# Patient Record
Sex: Male | Born: 1935
Health system: Southern US, Community
[De-identification: ages and names within clinical notes are randomized; demographics above are authoritative.]

## PROBLEM LIST (undated history)

## (undated) DIAGNOSIS — Z923 Personal history of irradiation: Secondary | ICD-10-CM

## (undated) DIAGNOSIS — Z85828 Personal history of other malignant neoplasm of skin: Secondary | ICD-10-CM

## (undated) DIAGNOSIS — N281 Cyst of kidney, acquired: Secondary | ICD-10-CM

## (undated) DIAGNOSIS — K08109 Complete loss of teeth, unspecified cause, unspecified class: Secondary | ICD-10-CM

## (undated) DIAGNOSIS — E039 Hypothyroidism, unspecified: Secondary | ICD-10-CM

## (undated) DIAGNOSIS — I722 Aneurysm of renal artery: Secondary | ICD-10-CM

## (undated) DIAGNOSIS — C44111 Basal cell carcinoma of skin of unspecified eyelid, including canthus: Secondary | ICD-10-CM

## (undated) DIAGNOSIS — M314 Aortic arch syndrome [Takayasu]: Secondary | ICD-10-CM

## (undated) DIAGNOSIS — N400 Enlarged prostate without lower urinary tract symptoms: Secondary | ICD-10-CM

## (undated) DIAGNOSIS — C4491 Basal cell carcinoma of skin, unspecified: Secondary | ICD-10-CM

## (undated) DIAGNOSIS — Z960 Presence of urogenital implants: Secondary | ICD-10-CM

## (undated) DIAGNOSIS — K219 Gastro-esophageal reflux disease without esophagitis: Secondary | ICD-10-CM

## (undated) DIAGNOSIS — IMO0002 Reserved for concepts with insufficient information to code with codable children: Secondary | ICD-10-CM

## (undated) DIAGNOSIS — C4921 Malignant neoplasm of connective and soft tissue of right lower limb, including hip: Secondary | ICD-10-CM

## (undated) DIAGNOSIS — Z859 Personal history of malignant neoplasm, unspecified: Secondary | ICD-10-CM

## (undated) DIAGNOSIS — I701 Atherosclerosis of renal artery: Secondary | ICD-10-CM

## (undated) DIAGNOSIS — N183 Chronic kidney disease, stage 3 unspecified: Secondary | ICD-10-CM

## (undated) DIAGNOSIS — G4733 Obstructive sleep apnea (adult) (pediatric): Secondary | ICD-10-CM

## (undated) DIAGNOSIS — Z9889 Other specified postprocedural states: Secondary | ICD-10-CM

## (undated) DIAGNOSIS — E785 Hyperlipidemia, unspecified: Secondary | ICD-10-CM

## (undated) DIAGNOSIS — I10 Essential (primary) hypertension: Secondary | ICD-10-CM

## (undated) DIAGNOSIS — I890 Lymphedema, not elsewhere classified: Secondary | ICD-10-CM

## (undated) DIAGNOSIS — Z789 Other specified health status: Secondary | ICD-10-CM

## (undated) DIAGNOSIS — M316 Other giant cell arteritis: Secondary | ICD-10-CM

## (undated) DIAGNOSIS — Z972 Presence of dental prosthetic device (complete) (partial): Secondary | ICD-10-CM

## (undated) DIAGNOSIS — C099 Malignant neoplasm of tonsil, unspecified: Secondary | ICD-10-CM

## (undated) DIAGNOSIS — Z9989 Dependence on other enabling machines and devices: Secondary | ICD-10-CM

## (undated) DIAGNOSIS — R5383 Other fatigue: Secondary | ICD-10-CM

## (undated) DIAGNOSIS — E538 Deficiency of other specified B group vitamins: Secondary | ICD-10-CM

## (undated) DIAGNOSIS — H3530 Unspecified macular degeneration: Secondary | ICD-10-CM

## (undated) DIAGNOSIS — G2581 Restless legs syndrome: Secondary | ICD-10-CM

## (undated) DIAGNOSIS — R911 Solitary pulmonary nodule: Secondary | ICD-10-CM

## (undated) DIAGNOSIS — H353130 Nonexudative age-related macular degeneration, bilateral, stage unspecified: Secondary | ICD-10-CM

## (undated) DIAGNOSIS — K117 Disturbances of salivary secretion: Secondary | ICD-10-CM

## (undated) DIAGNOSIS — N261 Atrophy of kidney (terminal): Secondary | ICD-10-CM

## (undated) DIAGNOSIS — R351 Nocturia: Secondary | ICD-10-CM

## (undated) DIAGNOSIS — Z8546 Personal history of malignant neoplasm of prostate: Secondary | ICD-10-CM

## (undated) DIAGNOSIS — T4145XA Adverse effect of unspecified anesthetic, initial encounter: Secondary | ICD-10-CM

## (undated) DIAGNOSIS — N320 Bladder-neck obstruction: Secondary | ICD-10-CM

## (undated) DIAGNOSIS — T8859XA Other complications of anesthesia, initial encounter: Secondary | ICD-10-CM

## (undated) DIAGNOSIS — R339 Retention of urine, unspecified: Secondary | ICD-10-CM

## (undated) DIAGNOSIS — E559 Vitamin D deficiency, unspecified: Secondary | ICD-10-CM

## (undated) HISTORY — PX: OTHER SURGICAL HISTORY: SHX169

## (undated) HISTORY — DX: Basal cell carcinoma of skin of unspecified eyelid, including canthus: C44.111

## (undated) HISTORY — DX: Restless legs syndrome: G25.81

## (undated) HISTORY — DX: Vitamin D deficiency, unspecified: E55.9

## (undated) HISTORY — PX: TONSILLECTOMY: SUR1361

## (undated) HISTORY — PX: CATARACT EXTRACTION W/ INTRAOCULAR LENS  IMPLANT, BILATERAL: SHX1307

## (undated) HISTORY — DX: Reserved for concepts with insufficient information to code with codable children: IMO0002

## (undated) HISTORY — DX: Essential (primary) hypertension: I10

## (undated) HISTORY — DX: Nocturia: R35.1

## (undated) HISTORY — DX: Deficiency of other specified B group vitamins: E53.8

## (undated) HISTORY — DX: Hyperlipidemia, unspecified: E78.5

## (undated) HISTORY — PX: MOHS SURGERY: SUR867

---
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
## 1998-01-17 HISTORY — PX: INGUINAL HERNIA REPAIR: SUR1180

## 2000-01-18 DIAGNOSIS — Z923 Personal history of irradiation: Secondary | ICD-10-CM

## 2000-01-18 HISTORY — DX: Personal history of irradiation: Z92.3

## 2001-01-17 HISTORY — PX: OTHER SURGICAL HISTORY: SHX169

## 2008-06-25 ENCOUNTER — Emergency Department (HOSPITAL_COMMUNITY): Admission: EM | Admit: 2008-06-25 | Discharge: 2008-06-25 | Payer: Self-pay | Admitting: Emergency Medicine

## 2010-04-26 LAB — URINE CULTURE: Colony Count: 55000

## 2010-04-26 LAB — URINALYSIS, ROUTINE W REFLEX MICROSCOPIC
Bilirubin Urine: NEGATIVE
Ketones, ur: NEGATIVE mg/dL
Nitrite: POSITIVE — AB
Protein, ur: 100 mg/dL — AB
Urobilinogen, UA: 0.2 mg/dL (ref 0.0–1.0)
pH: 6 (ref 5.0–8.0)

## 2010-08-02 DIAGNOSIS — G4700 Insomnia, unspecified: Secondary | ICD-10-CM | POA: Insufficient documentation

## 2010-08-02 DIAGNOSIS — M479 Spondylosis, unspecified: Secondary | ICD-10-CM | POA: Insufficient documentation

## 2010-09-28 ENCOUNTER — Ambulatory Visit (HOSPITAL_COMMUNITY)
Admission: RE | Admit: 2010-09-28 | Discharge: 2010-09-28 | Disposition: A | Discharge status: Home / Self Care | Payer: Medicare Other | Source: Ambulatory Visit | Attending: Chiropractic Medicine | Admitting: Chiropractic Medicine

## 2010-09-28 ENCOUNTER — Other Ambulatory Visit (HOSPITAL_COMMUNITY): Payer: Self-pay | Admitting: Chiropractic Medicine

## 2010-09-28 DIAGNOSIS — M549 Dorsalgia, unspecified: Secondary | ICD-10-CM | POA: Insufficient documentation

## 2010-09-28 DIAGNOSIS — M51379 Other intervertebral disc degeneration, lumbosacral region without mention of lumbar back pain or lower extremity pain: Secondary | ICD-10-CM | POA: Insufficient documentation

## 2010-09-28 DIAGNOSIS — M5380 Other specified dorsopathies, site unspecified: Secondary | ICD-10-CM | POA: Insufficient documentation

## 2010-09-28 DIAGNOSIS — M5137 Other intervertebral disc degeneration, lumbosacral region: Secondary | ICD-10-CM | POA: Insufficient documentation

## 2010-12-25 ENCOUNTER — Emergency Department (HOSPITAL_COMMUNITY)
Admission: EM | Admit: 2010-12-25 | Discharge: 2010-12-25 | Disposition: A | Discharge status: Home / Self Care | Payer: Medicare Other | Attending: Emergency Medicine | Admitting: Emergency Medicine

## 2010-12-25 DIAGNOSIS — N35919 Unspecified urethral stricture, male, unspecified site: Secondary | ICD-10-CM | POA: Insufficient documentation

## 2010-12-25 DIAGNOSIS — Z923 Personal history of irradiation: Secondary | ICD-10-CM | POA: Insufficient documentation

## 2010-12-25 DIAGNOSIS — R310 Gross hematuria: Secondary | ICD-10-CM | POA: Insufficient documentation

## 2010-12-25 DIAGNOSIS — R339 Retention of urine, unspecified: Secondary | ICD-10-CM

## 2010-12-25 DIAGNOSIS — N138 Other obstructive and reflux uropathy: Secondary | ICD-10-CM | POA: Insufficient documentation

## 2010-12-25 DIAGNOSIS — N401 Enlarged prostate with lower urinary tract symptoms: Secondary | ICD-10-CM | POA: Insufficient documentation

## 2010-12-25 DIAGNOSIS — Z8546 Personal history of malignant neoplasm of prostate: Secondary | ICD-10-CM | POA: Insufficient documentation

## 2010-12-25 HISTORY — PX: OTHER SURGICAL HISTORY: SHX169

## 2010-12-25 LAB — CBC
HCT: 36.9 % — ABNORMAL LOW (ref 39.0–52.0)
Hemoglobin: 12.9 g/dL — ABNORMAL LOW (ref 13.0–17.0)
MCV: 91.3 fL (ref 78.0–100.0)
RDW: 12.7 % (ref 11.5–15.5)
WBC: 5.8 10*3/uL (ref 4.0–10.5)

## 2010-12-25 LAB — BASIC METABOLIC PANEL
BUN: 18 mg/dL (ref 6–23)
Chloride: 103 mEq/L (ref 96–112)
Creatinine, Ser: 0.96 mg/dL (ref 0.50–1.35)
GFR calc Af Amer: >90 mL/min (ref >90)
Glucose, Bld: 98 mg/dL (ref 70–99)
Potassium: 3.6 mEq/L (ref 3.5–5.1)

## 2010-12-25 NOTE — ED Provider Notes (Signed)
History     CSN: [id number] Arrival date & time: 12/25/2010  9:28 AM   First MD Initiated Contact with Patient 12/25/10 1039      Chief Complaint  Patient presents with  . Hematuria    (Consider location/radiation/quality/duration/timing/severity/associated sxs/prior treatment) HPI Comments: Patient with a history of prostate cancer status post radiation 10 years ago, has been using Boude catheters to self cath since that time -- presents with blood with cathing himself since 3 AM. The last time he was able to successfully cath himself is a 3 AM. The patient was seen in [place] by his urologist Dr. Selena Batten yesterday and had a cystoscopy done. Patient has needed dilations of the urethra in the past and has not had any complications in the recent year. Patient noted that he had a fasting dripping of blood after cathing on several occasions this morning. Otherwise patient denies fever, nausea, vomiting, abdominal pain, or any other concerns.  Patient is a 77 y.o. male presenting with hematuria. The history is provided by the patient.  Hematuria This is a new problem. The current episode started today. The problem is unchanged. He describes the hematuria as gross hematuria. The hematuria occurs during the initial portion of his urinary stream. He reports no clotting in his urine stream. The pain is moderate. He describes his urine color as light pink. Irritative symptoms do not include frequency or urgency. Associated symptoms include abdominal pain. Pertinent negatives include no dysuria, fever, flank pain, nausea or vomiting.    Past Medical History  Diagnosis Date  . Cancer of prostate     History reviewed. No pertinent past surgical history.  No family history on file.  History  Substance Use Topics  . Smoking status: Not on file  . Smokeless tobacco: Not on file  . Alcohol Use:       Review of Systems  Constitutional: Negative for fever.  HENT: Negative for sore throat and  rhinorrhea.   Eyes: Negative for redness.  Respiratory: Negative for shortness of breath.   Cardiovascular: Negative for chest pain.  Gastrointestinal: Positive for abdominal pain and abdominal distention. Negative for nausea, vomiting, diarrhea and constipation.  Genitourinary: Positive for hematuria. Negative for dysuria, urgency, frequency and flank pain.  Musculoskeletal: Negative for myalgias.  Skin: Negative for rash.  Neurological: Negative for dizziness and headaches.    Allergies  Suprax  Home Medications   Current Outpatient Rx  Name Route Sig Dispense Refill  . VITAMIN C PO Oral Take 1 capsule by mouth daily. 8000 MG     . CALCIUM CARBONATE 1500 MG PO TABS Oral Take 1 tablet by mouth daily.      Marland Kitchen VITAMIN D PO Oral Take 1 capsule by mouth daily. 4000 Units     . L-ARGININE PO Oral Take 2 g by mouth daily.      Carma Leaven M PLUS PO TABS Oral Take 2 tablets by mouth 2 (two) times daily.      Marland Kitchen RESVERATROL PO Oral Take 4 capsules by mouth daily.      . TURMERIC CURCUMIN PO Oral Take 4 g by mouth daily.      . CYANOCOBALAMIN 1000 MCG/ML IJ SOLN Intramuscular Inject 1,000 mcg into the muscle every 21 ( twenty-one) days.        BP 162/93  Pulse 50  Temp(Src) 98 F (36.7 C) (Oral)  Resp 16  SpO2 98%  Physical Exam  Nursing note and vitals reviewed. Constitutional: He is oriented to person,  place, and time. He appears well-developed and well-nourished.  HENT:  Head: Normocephalic and atraumatic.  Eyes: Conjunctivae are normal. Right eye exhibits no discharge. Left eye exhibits no discharge.  Neck: Normal range of motion. Neck supple.  Cardiovascular: Normal rate, regular rhythm and normal heart sounds.   Pulmonary/Chest: Effort normal and breath sounds normal.  Abdominal: Soft. Bowel sounds are normal. He exhibits distension. There is tenderness. There is no rebound and no guarding.       Patient with suprapubic tenderness and mild suprapubic distention.  Genitourinary:  No penile tenderness.       Small amount of dried blood on penis. Patient is uncircumcised.  Musculoskeletal: He exhibits no edema.  Neurological: He is alert and oriented to person, place, and time.  Skin: Skin is warm and dry.  Psychiatric: He has a normal mood and affect.    ED Course  Procedures (including critical care time)  Labs Reviewed  CBC - Abnormal; Notable for the following:    RBC 4.04 (*)    Hemoglobin 12.9 (*)    HCT 36.9 (*)    All other components within normal limits  BASIC METABOLIC PANEL - Abnormal; Notable for the following:    GFR calc non Af Amer 79 (*)    All other components within normal limits   No results found.   1. Urinary retention     11:39 AM patient seen and discussed with Dr. Deretha Emory. I spoke with Dr. Berneice Heinrich of [facility] who will see patient in emergency department.  Dr. Berneice Heinrich saw patient and placed the Foley using a cystoscope. Blood tests are checked and are normal. Patient and family informed of results. Patient will follow up with his urologist next week and keep Foley in place.  MDM  Patient with acute urinary retention, history of prostate cancer. Foley placed by urology. Patient stable for discharge home. Her kidney function and blood counts are unremarkable.      Eustace Moore [place], [place] 12/25/10 [phone]

## 2010-12-25 NOTE — ED Notes (Signed)
Pt states that when he self catheterized himself this am he noticed there were a lot blood. Pt states has not been able to get urine in the last 7 hrs.

## 2010-12-25 NOTE — ED Provider Notes (Signed)
Medical screening examination/treatment/procedure(s) were conducted as a shared visit with non-physician practitioner(s) and myself.  I personally evaluated the patient during the encounter  Patient status post cystoscopy yesterday long-standing history of prostate problems to include prostate cancer patient normally has to catheterize himself using a coud catheter. All unable to avoid his had a lot of blood in his urine and blood clots from the urethra. Procedure done in [facility] but referred to [facility] here. Dust with on call urologist he will come in and catheterize the patient.      Shelda Jakes, MD 12/25/10 1130

## 2010-12-25 NOTE — Brief Op Note (Signed)
BRIEF OP NOTE:  Pre-op DX: Urethral Stricture, Urinary retention  Post-op DX: same  Procedure: Cysto, balloon dilation of urethral stricture, catheter placement complicated  Findings: prostatic stricture 3 F pre-dilaiton, 85F post dilation  EBL: minimal  Specimens: urine for gram stain and CX  Drains: 88F foley to straight drain  Anesthesia: topical lidocaine  Disposition: DC from ER  Dictation # [id number]  Ted B. Berneice Heinrich MD, [facility]

## 2010-12-25 NOTE — Consult Note (Signed)
Reason for Consult:Urinary Retention, Prostate Cancer  Referring Physician: ER Physician  Philip [name] is an 77 y.o. male.   HPI:  1 - Urinary Retention - Pt with complex GU history including treatment for prostate cancer as per below resulting in need for self-cath QID for past 10 years. Has had progressively more trouble and underwent operative dilation 06/2010 by Dr. Selena Batten which helped for awhile but now with trouble again. He was told if he had retention again to seek opinion in [place] possibly from [facility]. No other GU instrumentation.  2 - Prostate Cancer - Pt with unknown Gleason prostate cancer treated with combination of seeds + external beam radiotherapy and two years androgen ablation in 2002. He states his PSA's have been in the 0.1 range for many years.  Today Philip [name] is seen as urgent ER consult for urinary retention. He tried to self-cath at 3am but could not and had significant blood per urethra, he now presents with sensation of needing to void for approximatly 9 hours. Denies fever, chills, nausea, vomitting, flank pain.   Past Medical History  Diagnosis Date  . Cancer of prostate     History reviewed. No pertinent past surgical history.  No family history on file.  Social History:  does not have a smoking history on file. He does not have any smokeless tobacco history on file. His alcohol and drug histories not on file. He is a retired Biochemist, clinical for [employer] who was based in [place] for most of his career but now recently moved to [place]) to be closer to family.  Allergies:  Allergies  Allergen Reactions  . Suprax (Cefixime) Rash    Medications: Takes no medications.  No results found for this or any previous visit (from the past 48 hour(s)).  No results found.  Review of Systems  Constitutional: Negative.   HENT: Negative.   Eyes: Negative.   Respiratory: Negative.   Cardiovascular: Negative.   Gastrointestinal: Negative.     Genitourinary: Positive for frequency and hematuria.  Musculoskeletal: Negative.   Skin: Negative.   Neurological: Negative.   Endo/Heme/Allergies: Negative.   Psychiatric/Behavioral: Negative.    Blood pressure 162/93, pulse 50, temperature 98 F (36.7 C), temperature source Oral, resp. rate 16, SpO2 98.00%. Physical Exam  Vitals reviewed. Constitutional: He appears well-developed and well-nourished.       Here with family  HENT:  Head: Normocephalic and atraumatic.  Eyes: EOM are normal.  Neck: Normal range of motion. Neck supple.  Cardiovascular: Normal rate and regular rhythm.   Respiratory: Effort normal and breath sounds normal.  GI: Soft. Bowel sounds are normal.       Palpably full bladder above pubic symphysis  Genitourinary: Penis normal.       Blood at meatus  Musculoskeletal: Normal range of motion.  Neurological: He is alert.  Skin: Skin is warm and dry.  Psychiatric: He has a normal mood and affect. His behavior is normal. Judgment and thought content normal.    Assessment/Plan:  1 - Urinary Retention - Pt's history c/w likely outlet obstruction from radiation-induced scar tissue. Single attempt made a passage of 23F coude catheter met with resistance. Therefore decided with patient and family to proceed with bedside cysto and baloon dilation which was successful as per separate dictated operative note. 49F catheter placed after 16F balloon dilation. Short segment of narrowing seen approx 8mm proximal to sphincter in prostate along with bilobar hypertrophy. Sphincter intact and with coaptation  Pt will keep catheter in place  x at least 5 days and we will then seek f/u for opinion from Dr. Sherron Monday as per prior referral.   2 - Prostate Cancer - excellent biochemical control per history.  3 - Pt to be DC'd from ER after BMP to verify lytes wnl. Warned to contact MD for catheter problems or fever.  Please call with any questions or concerns Jacqulyn Bath MD,  [facility] [phone] pgr  [facility], Philip [name] 12/25/2010, 1:06 PM

## 2010-12-25 NOTE — ED Notes (Signed)
Urology to bedside

## 2010-12-25 NOTE — ED Notes (Signed)
Pt given discharge instructions and verbalizes understanding  

## 2010-12-25 NOTE — ED Notes (Signed)
Pt in via ems with bleeding from foley and abd pain states onset 1 hr ago hx of prostate ca

## 2010-12-25 NOTE — Op Note (Signed)
[name], TOWN              ACCOUNT NO.:  [id number]  MEDICAL RECORD NO.:  [id number]  LOCATION:  WA17                         FACILITY:  [facility]  PHYSICIAN:  Sebastian Ache, MD     DATE OF BIRTH:  [birth year]  DATE OF PROCEDURE: DATE OF DISCHARGE:                              OPERATIVE REPORT   DIAGNOSIS:  Urinary retention.  PROCEDURE:  Cystoscopy with balloon dilation of urethral stricture and catheter placement, complicated.  FINDINGS: 1. Evidence of incomplete false pass in the proximal penile urethra     with true lumen at 12 o'clock. 2. Urethral sphincter with coaptation. 3. Area of severe prostatic narrowing, approximately 3-French     predilation, approximately 8-mm proximal to the urethral sphincter,     a 24-French post-dilation. 4. Bilobar prostatic hypertrophy.  ESTIMATED BLOOD LOSS:  Nil.  COMPLICATIONS:  None.  DRAINS:  18-French Foley catheter straight drain.  INDICATION:  [name] is a 77 year old gentleman with history of prostate cancer, status post radiotherapy with seeds and external beam as well as androgen deprivation in 2002.  He has had problems with progressive urinary retention since and has been catheter-dependent performing CIC four times daily for the last 10 years.  He presents to the ER today with approximately a 9-hour history of urinary retention having failed to be able to perform CIC on himself at approximately 3 a.m. due to resistance and blood per urethra.  He mentioned he had known history of urethral stricture in his prostatic urethra and he underwent previous dilation.  After a single gentle attempt of catheter placement failed, I talked to him about options of bedside cystoscopy dilation versus operative dilation versus suprapubic tube and the patient wished to proceed with a trial of bedside cystoscopy and dilation.  PROCEDURE IN DETAIL:  Patient being [place] was confirmed.  Sterile field was created by prepping  and draping the patient's penis and perineum using iodine x3.  Next, cystourethroscopy was performed using 16-French flexible cystoscope.  This revealed an unremarkable anterior urethra.  Posterior urethra was significant for erythema consistent with partial false pass with true lumen at 12 o'clock.  Urethral sphincter was intact with adequate coaptation.  A high-grade stricture was seen approximately 1-cm proximal to this and appeared to be about 3-French.  This was unable to be successfully navigated with the cystoscope.  A 0.038 Sensor wire was advanced through this over which a 24-French NephroMax balloon dilation apparatus was advanced using cystoscopic guidance.  This was inflated to a pressure of 14 atmospheres, hold for 90 seconds and released.  A new 18-French Councill catheter was then advanced over the wire to the level of the urinary bladder.  900 cc of clear urine was effluxed.  10 cc of water was placed in the balloon and connected to straight drain. Procedure was then terminated.  Patient tolerated the procedure well. There were no immediate procedural complications.          ______________________________ Sebastian Ache, MD     TM/MEDQ  D:  12/25/2010  T:  12/25/2010  Job:  [id number]

## 2010-12-25 NOTE — ED Notes (Signed)
EAV:[id number]<JX> Expected date:12/25/10<BR> Expected time: 9:05 AM<BR> Means of arrival:Ambulance<BR> Comments:<BR> Foley urology

## 2010-12-27 NOTE — ED Provider Notes (Signed)
Medical screening examination/treatment/procedure(s) were conducted as a shared visit with non-physician practitioner(s) and myself.  I personally evaluated the patient during the encounter   Shelda Jakes, MD 12/27/10 [phone]

## 2011-01-28 DIAGNOSIS — E538 Deficiency of other specified B group vitamins: Secondary | ICD-10-CM

## 2011-01-28 DIAGNOSIS — N35919 Unspecified urethral stricture, male, unspecified site: Secondary | ICD-10-CM | POA: Diagnosis not present

## 2011-01-28 DIAGNOSIS — R339 Retention of urine, unspecified: Secondary | ICD-10-CM | POA: Diagnosis not present

## 2011-01-28 DIAGNOSIS — L570 Actinic keratosis: Secondary | ICD-10-CM | POA: Insufficient documentation

## 2011-01-28 HISTORY — DX: Deficiency of other specified B group vitamins: E53.8

## 2011-01-31 DIAGNOSIS — R5381 Other malaise: Secondary | ICD-10-CM | POA: Diagnosis not present

## 2011-01-31 DIAGNOSIS — R5383 Other fatigue: Secondary | ICD-10-CM | POA: Diagnosis not present

## 2011-01-31 DIAGNOSIS — I10 Essential (primary) hypertension: Secondary | ICD-10-CM | POA: Diagnosis not present

## 2011-01-31 DIAGNOSIS — E538 Deficiency of other specified B group vitamins: Secondary | ICD-10-CM | POA: Diagnosis not present

## 2011-01-31 DIAGNOSIS — R7989 Other specified abnormal findings of blood chemistry: Secondary | ICD-10-CM | POA: Diagnosis not present

## 2011-02-03 DIAGNOSIS — R5381 Other malaise: Secondary | ICD-10-CM | POA: Diagnosis not present

## 2011-02-03 DIAGNOSIS — R7989 Other specified abnormal findings of blood chemistry: Secondary | ICD-10-CM | POA: Diagnosis not present

## 2011-02-03 DIAGNOSIS — I10 Essential (primary) hypertension: Secondary | ICD-10-CM | POA: Diagnosis not present

## 2011-03-23 DIAGNOSIS — R339 Retention of urine, unspecified: Secondary | ICD-10-CM | POA: Diagnosis not present

## 2011-03-23 DIAGNOSIS — Z8546 Personal history of malignant neoplasm of prostate: Secondary | ICD-10-CM | POA: Diagnosis not present

## 2011-03-23 DIAGNOSIS — N35919 Unspecified urethral stricture, male, unspecified site: Secondary | ICD-10-CM | POA: Diagnosis not present

## 2011-03-30 DIAGNOSIS — M545 Low back pain: Secondary | ICD-10-CM | POA: Diagnosis not present

## 2011-03-30 DIAGNOSIS — M546 Pain in thoracic spine: Secondary | ICD-10-CM | POA: Diagnosis not present

## 2011-03-30 DIAGNOSIS — M999 Biomechanical lesion, unspecified: Secondary | ICD-10-CM | POA: Diagnosis not present

## 2011-05-18 ENCOUNTER — Other Ambulatory Visit: Payer: Self-pay | Admitting: Urology

## 2011-05-18 DIAGNOSIS — R339 Retention of urine, unspecified: Secondary | ICD-10-CM | POA: Diagnosis not present

## 2011-05-18 DIAGNOSIS — N35919 Unspecified urethral stricture, male, unspecified site: Secondary | ICD-10-CM | POA: Diagnosis not present

## 2011-05-27 ENCOUNTER — Encounter (HOSPITAL_BASED_OUTPATIENT_CLINIC_OR_DEPARTMENT_OTHER): Payer: Self-pay | Admitting: *Deleted

## 2011-05-30 ENCOUNTER — Encounter (HOSPITAL_BASED_OUTPATIENT_CLINIC_OR_DEPARTMENT_OTHER): Payer: Self-pay | Admitting: *Deleted

## 2011-05-30 NOTE — Progress Notes (Signed)
NPO AFTER MN. ARRIVES AT 1115. NEEDS HG AND EKG.

## 2011-05-30 NOTE — H&P (Signed)
History of Present Illness   Philip [name] has scarred prostatic urethra from radiation. He cath's 4 times a day. In the last month he has had a little bit more difficulty or tight in the catheter but he did say this tends to come and go. Overall the catheterizing is reasonable. There is no other modifying factors or associated signs or symptoms. There is no other aggravating or relieving factors. The symptoms are mild in severity but persistent.  He is going to the [place] in June. He wondered if he should be dilated prior. He is trying to limit costs, etc.   Review of systems: No change in bowel or neurologic status.   Urinalysis: I reviewed, negative.   Urine was sent for culture.    Past Medical History Problems  1. History of  Adult Sleep Apnea 780.57 2. History of  Depression 311 3. History of  Prostate Cancer V10.46  Surgical History Problems  1. History of  Cystoscopy For Urethral Stricture 2. History of  Hernia Repair 3. History of  Urethra Surgery  Current Meds 1. Acetyl L-Carnitine CAPS; Therapy: (Recorded:11Jan2013) to 2. Arginine TABS; Therapy: (Recorded:11Jan2013) to 3. Bromelain TABS; Therapy: (Recorded:11Jan2013) to 4. Calcium TABS; Therapy: (Recorded:11Jan2013) to 5. Chondroitin Sulfate CAPS; Therapy: (Recorded:11Jan2013) to 6. Collagen CAPS; Therapy: (Recorded:11Jan2013) to 7. CoQ-10 10 MG Oral Capsule; Therapy: (Recorded:11Jan2013) to 8. DHA Complete CAPS; Therapy: (Recorded:11Jan2013) to 9. Digestive Enzymes Oral Tablet; Therapy: (Recorded:11Jan2013) to 10. EPA Plus CAPS; Therapy: (Recorded:11Jan2013) to 11. Glucosamine CAPS; Therapy: (Recorded:11Jan2013) to 12. Hyaluronic Acid Powder; Therapy: (Recorded:11Jan2013) to 13. L-Arginine TABS; Therapy: (Recorded:11Jan2013) to 14. L-Citrulline CAPS; Therapy: (Recorded:11Jan2013) to 15. Magnesium TABS; Therapy: (Recorded:11Jan2013) to 16. MSM 1000 MG Oral Tablet; Therapy: (Recorded:11Jan2013) to 17. Multi-Vitamin  TABS; Therapy: (Recorded:11Jan2013) to 18. Pepsin Powder; Therapy: (Recorded:11Jan2013) to 19. Potassium Chloride Powder; Therapy: (Recorded:11Jan2013) to 20. Quercetin TABS; Therapy: (Recorded:11Jan2013) to 21. Resveratrol 100 MG Oral Capsule; Therapy: (Recorded:11Jan2013) to 22. Silica CAPS; Therapy: (Recorded:11Jan2013) to 23. Strontium Chloride Crystals; Therapy: (Recorded:11Jan2013) to 24. Vanadium CAPS; Therapy: (Recorded:11Jan2013) to 25. Vitamin D CAPS; Therapy: (Recorded:11Jan2013) to 26. Vitamin D3 CAPS; Therapy: (Recorded:11Jan2013) to 27. Vitamin E CAPS; Therapy: (Recorded:11Jan2013) to 28. Vitamin K TABS; Therapy: (Recorded:11Jan2013) to  Allergies Medication  1. Suprax TABS  Family History Problems  1. Paternal history of  Class I Angina 2. Sororal history of  Class I Angina 3. Paternal history of  Death In The Family Philip [name] 4. Maternal history of  Death In The Family Philip [name] 7. Family history of  Family Health Status Number Of Children 2 daughters  Social History Problems  1. Alcohol Use one glass of wine 2. Caffeine Use 2 cups of coffee 3. Marital History - Currently Married 4. Never A Smoker 5. Occupation: Retired Industrial/product designer  6. History of  Tobacco Use  Vitals Vital Signs [Data Includes: Last 1 Day]  01May2013 09:44AM  Blood Pressure: 158 / 89 Temperature: 98.3 F Heart Rate: 59  Results/Data Urine [Data Includes: Last 1 Day]   01May2013  COLOR ORANGE   APPEARANCE CLEAR   SPECIFIC GRAVITY 1.020   pH 6.5   GLUCOSE NEG mg/dL  BILIRUBIN NEG   KETONE NEG mg/dL  BLOOD MOD   PROTEIN 30 mg/dL  UROBILINOGEN 0.2 mg/dL  NITRITE NEG   LEUKOCYTE ESTERASE NEG   SQUAMOUS EPITHELIAL/HPF NONE SEEN   WBC NONE SEEN WBC/hpf  RBC 7-10 RBC/hpf  BACTERIA NONE SEEN   CRYSTALS NONE SEEN  CASTS NONE SEEN    Assessment Assessed  1. Incomplete Emptying Of Bladder 788.21 2. Urethral  Stricture 598.9  Plan Health Maintenance (V70.0)  1. UA With REFLEX  Done: 01May2013 09:31AM  Discussion/Summary   I went over treatment options with Mr. and Philip [name] for his stable incomplete bladder emptying and possibly worsening prostatic urethral stricture. He is clinically not infected. I actually thought watchful waiting would be fine but also went over balloon dilation in detail.  We talked about balloon dilation in detail. Pros, cons, general surgical and anesthetic risks, and other options including watchful waiting were discussed. He understands that dilation is generally successful in most cases but long-term success rates are low. We talked about the risk of persistent, de novo, or worsening incontinence and voiding dysfunction. Risks were described but not limited to the discussion of injury to neighboring structures and soft tissues. Bleeding, infection, pain, erectile dysfunction, and spraying of urination were discussed. The risk of neuropathy was discussed as well as the usual post-operative course.  I did discuss the rare risk of incontinence or worsening ability to catheterize but I think both would be very rare. I do not have balloon dilation catheters in the office and this was discussed. It would need to be done in the operating room. Our policy is that a patient must have IV sedation and he consented to this.  I hope to do this in the next 2 weeks or so since he leaves in 6 weeks on his trip to the [place]. He has had trouble catheterizing after cystoscopy in the past.   I am going to send a copy of my note to Dr. Adaline Sill who is his treating urologist in [place].  After a thorough review of the management options for the patient's condition the patient  elected to proceed with surgical therapy as noted above. We have discussed the potential benefits and risks of the procedure, side effects of the proposed treatment, the likelihood of the patient achieving the goals of  the procedure, and any potential problems that might occur during the procedure or recuperation. Informed consent has been obtained.

## 2011-05-31 ENCOUNTER — Encounter (HOSPITAL_BASED_OUTPATIENT_CLINIC_OR_DEPARTMENT_OTHER): Payer: Self-pay | Admitting: Anesthesiology

## 2011-05-31 ENCOUNTER — Ambulatory Visit (HOSPITAL_BASED_OUTPATIENT_CLINIC_OR_DEPARTMENT_OTHER)
Admission: RE | Admit: 2011-05-31 | Discharge: 2011-05-31 | Disposition: A | Discharge status: Home / Self Care | Payer: Medicare Other | Source: Ambulatory Visit | Attending: Urology | Admitting: Urology

## 2011-05-31 ENCOUNTER — Encounter (HOSPITAL_BASED_OUTPATIENT_CLINIC_OR_DEPARTMENT_OTHER)
Admission: RE | Disposition: A | Discharge status: Home / Self Care | Payer: Self-pay | Source: Ambulatory Visit | Attending: Urology

## 2011-05-31 ENCOUNTER — Encounter (HOSPITAL_BASED_OUTPATIENT_CLINIC_OR_DEPARTMENT_OTHER): Payer: Self-pay | Admitting: *Deleted

## 2011-05-31 ENCOUNTER — Ambulatory Visit (HOSPITAL_BASED_OUTPATIENT_CLINIC_OR_DEPARTMENT_OTHER): Payer: Medicare Other | Admitting: Anesthesiology

## 2011-05-31 DIAGNOSIS — G4730 Sleep apnea, unspecified: Secondary | ICD-10-CM | POA: Diagnosis not present

## 2011-05-31 DIAGNOSIS — Z8546 Personal history of malignant neoplasm of prostate: Secondary | ICD-10-CM | POA: Insufficient documentation

## 2011-05-31 DIAGNOSIS — N35919 Unspecified urethral stricture, male, unspecified site: Secondary | ICD-10-CM | POA: Diagnosis not present

## 2011-05-31 DIAGNOSIS — Z79899 Other long term (current) drug therapy: Secondary | ICD-10-CM | POA: Insufficient documentation

## 2011-05-31 DIAGNOSIS — R339 Retention of urine, unspecified: Secondary | ICD-10-CM | POA: Insufficient documentation

## 2011-05-31 HISTORY — DX: Retention of urine, unspecified: R33.9

## 2011-05-31 HISTORY — DX: Other specified health status: Z78.9

## 2011-05-31 HISTORY — DX: Obstructive sleep apnea (adult) (pediatric): G47.33

## 2011-05-31 HISTORY — DX: Personal history of malignant neoplasm of prostate: Z85.46

## 2011-05-31 HISTORY — PX: CYSTOSCOPY WITH URETHRAL DILATATION: SHX5125

## 2011-05-31 SURGERY — CYSTOSCOPY, WITH URETHRAL DILATION
Anesthesia: General | Site: Urethra | Wound class: Clean Contaminated

## 2011-05-31 MED ORDER — MIDAZOLAM HCL 5 MG/5ML IJ SOLN
INTRAMUSCULAR | Status: DC | PRN
  Administered 2011-05-31: 1 mg via INTRAVENOUS

## 2011-05-31 MED ORDER — PROMETHAZINE HCL 25 MG/ML IJ SOLN: 6.2500 mg | INTRAMUSCULAR | Status: DC | PRN

## 2011-05-31 MED ORDER — FENTANYL CITRATE 0.05 MG/ML IJ SOLN: 25.0000 ug | INTRAMUSCULAR | Status: DC | PRN

## 2011-05-31 MED ORDER — MEPERIDINE HCL 25 MG/ML IJ SOLN: 6.2500 mg | INTRAMUSCULAR | Status: DC | PRN

## 2011-05-31 MED ORDER — LACTATED RINGERS IV SOLN: INTRAVENOUS | Status: DC

## 2011-05-31 MED ORDER — CEFAZOLIN SODIUM 1-5 GM-% IV SOLN: 1.0000 g | INTRAVENOUS | Status: DC

## 2011-05-31 MED ORDER — PROPOFOL 10 MG/ML IV EMUL
INTRAVENOUS | Status: DC | PRN
  Administered 2011-05-31: 25 ug/kg/min via INTRAVENOUS

## 2011-05-31 MED ORDER — CIPROFLOXACIN HCL 250 MG PO TABS: 250.0000 mg | ORAL_TABLET | Freq: Two times a day (BID) | ORAL | Status: AC

## 2011-05-31 MED ORDER — HYDROCODONE-ACETAMINOPHEN 5-500 MG PO TABS: 1.0000 | ORAL_TABLET | Freq: Four times a day (QID) | ORAL | Status: AC | PRN

## 2011-05-31 MED ORDER — SODIUM CHLORIDE 0.9 % IR SOLN
Status: DC | PRN
  Administered 2011-05-31: 3000 mL

## 2011-05-31 MED ORDER — LACTATED RINGERS IV SOLN
INTRAVENOUS | Status: DC
  Administered 2011-05-31: 12:00:00 via INTRAVENOUS

## 2011-05-31 MED ORDER — DEXAMETHASONE SODIUM PHOSPHATE 4 MG/ML IJ SOLN
INTRAMUSCULAR | Status: DC | PRN
  Administered 2011-05-31: 4 mg via INTRAVENOUS

## 2011-05-31 MED ORDER — GENTAMICIN IN SALINE 1.6-0.9 MG/ML-% IV SOLN
80.0000 mg | INTRAVENOUS | Status: AC
  Administered 2011-05-31: 80 mg via INTRAVENOUS

## 2011-05-31 MED ORDER — LIDOCAINE HCL (CARDIAC) 20 MG/ML IV SOLN
INTRAVENOUS | Status: DC | PRN
  Administered 2011-05-31: 20 mg via INTRAVENOUS

## 2011-05-31 MED ORDER — FENTANYL CITRATE 0.05 MG/ML IJ SOLN
INTRAMUSCULAR | Status: DC | PRN
  Administered 2011-05-31 (×2): 25 ug via INTRAVENOUS

## 2011-05-31 MED ORDER — ONDANSETRON HCL 4 MG/2ML IJ SOLN
INTRAMUSCULAR | Status: DC | PRN
  Administered 2011-05-31: 4 mg via INTRAVENOUS

## 2011-05-31 SURGICAL SUPPLY — 33 items
ADAPTER CATH URET PLST 4-6FR (CATHETERS) IMPLANT
BAG DRAIN URO-CYSTO SKYTR STRL (DRAIN) ×4 IMPLANT
BALLN NEPHROSTOMY (BALLOONS) ×4
BALLOON NEPHROSTOMY (BALLOONS) ×3 IMPLANT
CANISTER SUCT LVC 12 LTR MEDI- (MISCELLANEOUS) ×4 IMPLANT
CATH INTERMIT  6FR 70CM (CATHETERS) IMPLANT
CATH ROBINSON RED A/P 14FR (CATHETERS) ×4 IMPLANT
CATH URET 5FR 28IN CONE TIP (BALLOONS)
CATH URET 5FR 28IN OPEN ENDED (CATHETERS) IMPLANT
CATH URET 5FR 70CM CONE TIP (BALLOONS) IMPLANT
CLOTH BEACON ORANGE TIMEOUT ST (SAFETY) ×4 IMPLANT
DRAPE CAMERA CLOSED 9X96 (DRAPES) ×4 IMPLANT
GLOVE BIO SURGEON STRL SZ 6.5 (GLOVE) ×4 IMPLANT
GLOVE BIO SURGEON STRL SZ7.5 (GLOVE) ×4 IMPLANT
GLOVE ECLIPSE 6.0 STRL STRAW (GLOVE) ×4 IMPLANT
GOWN PREVENTION PLUS LG XLONG (DISPOSABLE) ×4 IMPLANT
GOWN STRL REIN XL XLG (GOWN DISPOSABLE) ×4 IMPLANT
GOWN SURGICAL LARGE (GOWNS) ×4 IMPLANT
GOWN XL W/COTTON TOWEL STD (GOWNS) ×4 IMPLANT
GUIDEWIRE 0.038 PTFE COATED (WIRE) IMPLANT
GUIDEWIRE ANG ZIPWIRE 038X150 (WIRE) IMPLANT
GUIDEWIRE STR DUAL SENSOR (WIRE) ×4 IMPLANT
IV NS IRRIG 3000ML ARTHROMATIC (IV SOLUTION) ×4 IMPLANT
KIT BALLIN UROMAX 15FX10 (LABEL) IMPLANT
KIT BALLN UROMAX 15FX4 (MISCELLANEOUS) IMPLANT
KIT BALLN UROMAX 26 75X4 (MISCELLANEOUS)
NS IRRIG 500ML POUR BTL (IV SOLUTION) ×4 IMPLANT
PACK CYSTOSCOPY (CUSTOM PROCEDURE TRAY) ×4 IMPLANT
SET HIGH PRES BAL DIL (LABEL)
SHEATH URET ACCESS 12FR/35CM (UROLOGICAL SUPPLIES) IMPLANT
SHEATH URET ACCESS 12FR/55CM (UROLOGICAL SUPPLIES) IMPLANT
SYRINGE IRR TOOMEY STRL 70CC (SYRINGE) IMPLANT
WATER STERILE IRR 3000ML UROMA (IV SOLUTION) IMPLANT

## 2011-05-31 NOTE — Discharge Instructions (Signed)
I have reviewed discharge instructions in detail with the patient. They will follow-up with me or their physician as scheduled. My nurse will also be calling the patients as per protocol.  °Post Anesthesia Home Care Instructions ° °Activity: °Get plenty of rest for the remainder of the day. A responsible adult should stay with you for 24 hours following the procedure.  °For the next 24 hours, DO NOT: °-Drive a car °-Operate machinery °-Drink alcoholic beverages °-Take any medication unless instructed by your physician °-Make any legal decisions or sign important papers. ° °Meals: °Start with liquid foods such as gelatin or soup. Progress to regular foods as tolerated. Avoid greasy, spicy, heavy foods. If nausea and/or vomiting occur, drink only clear liquids until the nausea and/or vomiting subsides. Call your physician if vomiting continues. ° °Special Instructions/Symptoms: °Your throat may feel dry or sore from the anesthesia or the breathing tube placed in your throat during surgery. If this causes discomfort, gargle with warm salt water. The discomfort should disappear within 24 hours. °CYSTOSCOPY HOME CARE INSTRUCTIONS ° °Activity: °Rest for the remainder of the day.  Do not drive or operate equipment today.  You may resume normal activities in one to two days as instructed by your physician.  ° °Meals: °Drink plenty of liquids and eat light foods such as gelatin or soup this evening.  You may return to a normal meal plan tomorrow. ° °Return to Work: °You may return to work in one to two days or as instructed by your physician. ° °Special Instructions / Symptoms: °Call your physician if any of these symptoms occur: ° ° -persistent or heavy bleeding ° -bleeding which continues after first few urination ° -large blood clots that are difficult to pass ° -urine stream diminishes or stops completely ° -fever equal to or higher than 101 degrees Farenheit. ° -cloudy urine with a strong, foul odor ° -severe  pain ° °Females should always wipe from front to back after elimination.  You may feel some burning pain when you urinate.  This should disappear with time.  Applying moist heat to the lower abdomen or a hot tub bath may help relieve the pain. \ ° °Follow-Up / Date of Return Visit to Your Physician:  *** °Call for an appointment to arrange follow-up. ° °Patient Signature:  ________________________________________________________ ° °Nurse's Signature:  ________________________________________________________ ° °

## 2011-05-31 NOTE — Progress Notes (Signed)
Dr Sherron Monday in to see pt as ordered. Informed pt hasn't voided @ this time & Dr Sherron Monday reports he didn't need to see pt prior to D/C.

## 2011-05-31 NOTE — Anesthesia Preprocedure Evaluation (Addendum)
Anesthesia Evaluation  Patient identified by MRN, date of birth, ID band Patient awake    Reviewed: Allergy & Precautions, H&P , NPO status , Patient's Chart, lab work & pertinent test results  Airway Mallampati: III TM Distance: >3 FB Neck ROM: Full    Dental No notable dental hx.    Pulmonary neg pulmonary ROS, sleep apnea (intolerant to cpap) ,  breath sounds clear to auscultation  Pulmonary exam normal       Cardiovascular negative cardio ROS  Rhythm:Regular Rate:Normal     Neuro/Psych negative neurological ROS  negative psych ROS   GI/Hepatic negative GI ROS, Neg liver ROS,   Endo/Other  negative endocrine ROS  Renal/GU negative Renal ROS  negative genitourinary   Musculoskeletal negative musculoskeletal ROS (+)   Abdominal   Peds negative pediatric ROS (+)  Hematology negative hematology ROS (+)   Anesthesia Other Findings   Reproductive/Obstetrics negative OB ROS                          Anesthesia Physical Anesthesia Plan  ASA: II  Anesthesia Plan: General and MAC   Post-op Pain Management:    Induction: Intravenous  Airway Management Planned:   Additional Equipment:   Intra-op Plan:   Post-operative Plan: Extubation in OR  Informed Consent: I have reviewed the patients History and Physical, chart, labs and discussed the procedure including the risks, benefits and alternatives for the proposed anesthesia with the patient or authorized representative who has indicated his/her understanding and acceptance.   Dental advisory given  Plan Discussed with: CRNA  Anesthesia Plan Comments:        Anesthesia Quick Evaluation

## 2011-05-31 NOTE — Transfer of Care (Signed)
Immediate Anesthesia Transfer of Care Note  Patient: Philip [name]  Procedure(s) Performed: Procedure(s) (LRB): CYSTOSCOPY WITH URETHRAL DILATATION (N/A)  Patient Location: PACU  Anesthesia Type: MAC  Level of Consciousness: awake, alert  and oriented  Airway & Oxygen Therapy: Patient Spontanous Breathing   Post-op Assessment: Report given to PACU RN and Post -op Vital signs reviewed and stable  Post vital signs: Reviewed and stable  Complications: No apparent anesthesia complications

## 2011-05-31 NOTE — Interval H&P Note (Signed)
History and Physical Interval Note:  05/31/2011 12:30 PM  Philip [name]  has presented today for surgery, with the diagnosis of URETHRAL STRICTURE  The various methods of treatment have been discussed with the patient and family. After consideration of risks, benefits and other options for treatment, the patient has consented to  Procedure(s) (LRB): CYSTOSCOPY WITH URETHRAL DILATATION (N/A) URETHROGRAM (N/A) CYSTOSCOPY WITH RETROGRADE PYELOGRAM (Bilateral) as a surgical intervention .  The patients' history has been reviewed, patient examined, no change in status, stable for surgery.  I have reviewed the patients' chart and labs.  Questions were answered to the patient's satisfaction.     Philip [name] A

## 2011-05-31 NOTE — Op Note (Signed)
Pre op diagnosis : urethral stricture Post Op diagnosis: urethral stricture Surgery: cystoscopy and balloon dilation Surgeon: Lorin Picket Azarius Lambson  Patient prepped and draped per usual. Antibiotics given.17 Fr scope utilized.  Penile and bulbar urethra normal. Prostatic urethra irregular and a bit narrow. Bilobar BPH and elevated bladder neck.  Sensor in bladder under fluoroscopic view. Passed balloon dilation catheter to correct location.  Dilated for 5 mins with 18 atm pressure  Deflated balloon and rescoped- wide open and no bleeding  No catheter  Tolerated it well with iv sedation

## 2011-06-01 ENCOUNTER — Encounter (HOSPITAL_BASED_OUTPATIENT_CLINIC_OR_DEPARTMENT_OTHER): Payer: Self-pay | Admitting: Urology

## 2011-06-01 NOTE — Anesthesia Postprocedure Evaluation (Signed)
Anesthesia Post Note  Patient: Philip [name]  Procedure(s) Performed: Procedure(s) (LRB): CYSTOSCOPY WITH URETHRAL DILATATION (N/A)  Anesthesia type: MAC  Patient location: PACU  Post pain: Pain level controlled  Post assessment: Post-op Vital signs reviewed  Last Vitals:  Filed Vitals:   05/31/11 1350  BP: 173/85  Pulse: 56  Temp: 36.1 C  Resp: 20    Post vital signs: Reviewed  Level of consciousness: sedated  Complications: No apparent anesthesia complications

## 2011-06-02 DIAGNOSIS — H259 Unspecified age-related cataract: Secondary | ICD-10-CM | POA: Diagnosis not present

## 2011-06-02 DIAGNOSIS — H3530 Unspecified macular degeneration: Secondary | ICD-10-CM | POA: Diagnosis not present

## 2011-06-02 DIAGNOSIS — H52209 Unspecified astigmatism, unspecified eye: Secondary | ICD-10-CM | POA: Diagnosis not present

## 2011-06-02 DIAGNOSIS — H43819 Vitreous degeneration, unspecified eye: Secondary | ICD-10-CM | POA: Diagnosis not present

## 2011-08-02 DIAGNOSIS — H25049 Posterior subcapsular polar age-related cataract, unspecified eye: Secondary | ICD-10-CM | POA: Diagnosis not present

## 2011-08-02 DIAGNOSIS — H269 Unspecified cataract: Secondary | ICD-10-CM | POA: Diagnosis not present

## 2011-08-02 DIAGNOSIS — H2510 Age-related nuclear cataract, unspecified eye: Secondary | ICD-10-CM | POA: Diagnosis not present

## 2011-08-02 DIAGNOSIS — H25019 Cortical age-related cataract, unspecified eye: Secondary | ICD-10-CM | POA: Diagnosis not present

## 2011-08-02 DIAGNOSIS — H52209 Unspecified astigmatism, unspecified eye: Secondary | ICD-10-CM | POA: Diagnosis not present

## 2011-08-08 DIAGNOSIS — G4700 Insomnia, unspecified: Secondary | ICD-10-CM | POA: Diagnosis not present

## 2011-08-08 DIAGNOSIS — G4710 Hypersomnia, unspecified: Secondary | ICD-10-CM | POA: Diagnosis not present

## 2011-08-08 DIAGNOSIS — E785 Hyperlipidemia, unspecified: Secondary | ICD-10-CM | POA: Diagnosis not present

## 2011-08-08 DIAGNOSIS — I10 Essential (primary) hypertension: Secondary | ICD-10-CM | POA: Diagnosis not present

## 2011-08-08 DIAGNOSIS — E538 Deficiency of other specified B group vitamins: Secondary | ICD-10-CM | POA: Diagnosis not present

## 2011-08-08 DIAGNOSIS — R634 Abnormal weight loss: Secondary | ICD-10-CM | POA: Diagnosis not present

## 2011-08-09 DIAGNOSIS — E538 Deficiency of other specified B group vitamins: Secondary | ICD-10-CM | POA: Diagnosis not present

## 2011-08-09 DIAGNOSIS — R634 Abnormal weight loss: Secondary | ICD-10-CM | POA: Diagnosis not present

## 2011-08-09 DIAGNOSIS — C61 Malignant neoplasm of prostate: Secondary | ICD-10-CM | POA: Diagnosis not present

## 2011-08-09 DIAGNOSIS — R5383 Other fatigue: Secondary | ICD-10-CM | POA: Diagnosis not present

## 2011-08-09 DIAGNOSIS — I10 Essential (primary) hypertension: Secondary | ICD-10-CM | POA: Diagnosis not present

## 2011-08-09 DIAGNOSIS — M79609 Pain in unspecified limb: Secondary | ICD-10-CM | POA: Diagnosis not present

## 2011-08-09 DIAGNOSIS — R7989 Other specified abnormal findings of blood chemistry: Secondary | ICD-10-CM | POA: Diagnosis not present

## 2011-08-09 DIAGNOSIS — E785 Hyperlipidemia, unspecified: Secondary | ICD-10-CM | POA: Diagnosis not present

## 2011-08-16 DIAGNOSIS — H2510 Age-related nuclear cataract, unspecified eye: Secondary | ICD-10-CM | POA: Diagnosis not present

## 2011-08-16 DIAGNOSIS — H52209 Unspecified astigmatism, unspecified eye: Secondary | ICD-10-CM | POA: Diagnosis not present

## 2011-08-16 DIAGNOSIS — H269 Unspecified cataract: Secondary | ICD-10-CM | POA: Diagnosis not present

## 2011-08-16 DIAGNOSIS — H25049 Posterior subcapsular polar age-related cataract, unspecified eye: Secondary | ICD-10-CM | POA: Diagnosis not present

## 2011-08-16 DIAGNOSIS — H25019 Cortical age-related cataract, unspecified eye: Secondary | ICD-10-CM | POA: Diagnosis not present

## 2011-09-05 DIAGNOSIS — M79609 Pain in unspecified limb: Secondary | ICD-10-CM | POA: Diagnosis not present

## 2011-09-05 DIAGNOSIS — I10 Essential (primary) hypertension: Secondary | ICD-10-CM | POA: Diagnosis not present

## 2011-09-05 DIAGNOSIS — G4730 Sleep apnea, unspecified: Secondary | ICD-10-CM | POA: Diagnosis not present

## 2011-09-05 DIAGNOSIS — R634 Abnormal weight loss: Secondary | ICD-10-CM | POA: Diagnosis not present

## 2011-09-05 DIAGNOSIS — G4733 Obstructive sleep apnea (adult) (pediatric): Secondary | ICD-10-CM | POA: Diagnosis not present

## 2011-09-12 ENCOUNTER — Encounter: Payer: Self-pay | Admitting: Pulmonary Disease

## 2011-09-13 ENCOUNTER — Ambulatory Visit (INDEPENDENT_AMBULATORY_CARE_PROVIDER_SITE_OTHER): Payer: Medicare Other | Admitting: Pulmonary Disease

## 2011-09-13 ENCOUNTER — Encounter: Payer: Self-pay | Admitting: Pulmonary Disease

## 2011-09-13 VITALS — BP 140/82 | HR 60 | Temp 98.8°F | Ht 72.0 in | Wt 158.0 lb

## 2011-09-13 DIAGNOSIS — G4761 Periodic limb movement disorder: Secondary | ICD-10-CM | POA: Diagnosis not present

## 2011-09-13 DIAGNOSIS — G4733 Obstructive sleep apnea (adult) (pediatric): Secondary | ICD-10-CM | POA: Diagnosis not present

## 2011-09-13 MED ORDER — ROPINIROLE HCL 0.5 MG PO TABS: ORAL_TABLET | ORAL | Status: DC

## 2011-09-13 NOTE — Assessment & Plan Note (Signed)
The patient has a history of significant periodic limb movements on his prior 2 sleep studies, and his wife has noticed significant kicking during the night.  He denies any early evening symptoms to suggest restless leg syndrome, however he could have periodic limb movement disorder.  At this point, I would like to treat him with a dopamine agonist to see if his sleep improves.  From his description of his sleep at night, it seems that he has more frequent disruptions of sleep rather than actual prolonged periods of time without sleep.

## 2011-09-13 NOTE — Progress Notes (Signed)
Subjective:    Patient ID: Philip [name], male    DOB: [birth year], 78 y.o.   MRN: [id number]  HPI The patient is a 78 year old male who I have been asked to see for multiple sleep issues.  He was diagnosed with central greater than obstructive sleep apnea in 2010, with an AHI of 19 events per hour.  He was tried on CPAP for approximately one year, but had significant issues with a poor mask fit that bothered him quite a bit.  He was not able to find a mask that fit him adequately.  He also felt on the few nights that he did wear it throughout, he saw no difference in his sleep or how he felt.  He admits that he never wore on a consistent basis.  Currently, patient is having some snoring according to his bed partner, but is unsure if he is having an abnormal breathing pattern during sleep.  He usually gets to sleep easily, but then awakens around 1 AM.  He does not necessarily stay away for long periods of time, but has frequent sleep disruptions throughout the rest of the night.  He is not rested in the mornings upon arising.  Despite this, he feels that his alertness during the day is normal, even with periods of inactivity.  However, his family member does note sleepiness in the evenings while watching television.  His bed partner has also noted prominent leg jerks at night which appear to disrupt his sleep.  However, the patient denies any classic RLS symptoms in the early evening.  He was noted to have large numbers of leg jerks on his sleep study, with arousals and awakenings.  The patient states that his weight is down 10 pounds over the last few years, and his Epworth score today is only 3.  Sleep Questionnaire: What time do you typically go to bed?( Between what hours) 9:30 and 10:30pm How long does it take you to fall asleep? 20 minutes How many times during the night do you wake up? 2 What time do you get out of bed to start your day? 0700 Do you drive or operate heavy machinery in your  occupation? No How much has your weight changed (up or down) over the past two years? (In pounds) 10 lb (4.536 kg) Have you ever had a sleep study before? Yes If yes, location of study? [facility] in [place], [place] If yes, date of study? feb 2010 Do you currently use CPAP? No Do you wear oxygen at any time? No    Review of Systems  Constitutional: Positive for unexpected weight change. Negative for fever.  HENT: Positive for dental problem. Negative for ear pain, nosebleeds, congestion, sore throat, rhinorrhea, sneezing, trouble swallowing, postnasal drip and sinus pressure.   Eyes: Negative for redness and itching.  Respiratory: Negative for cough, chest tightness, shortness of breath and wheezing.   Cardiovascular: Negative for palpitations and leg swelling.  Gastrointestinal: Negative for nausea and vomiting.  Genitourinary: Negative for dysuria.  Musculoskeletal: Negative for joint swelling.  Skin: Negative for rash.  Neurological: Negative for headaches.  Hematological: Does not bruise/bleed easily.  Psychiatric/Behavioral: Negative for dysphoric mood. The patient is not nervous/anxious.        Objective:   Physical Exam Constitutional:  Well developed, no acute distress  HENT:  Nares patent without discharge, but deviated septum to left with narrowing.   Oropharynx without exudate, palate and uvula are very elongated with some side wall encroachment.  Eyes:  Perrla, eomi, no scleral icterus  Neck:  No JVD, no TMG  Cardiovascular:  Normal rate, regular rhythm, no rubs or gallops.  No murmurs        Intact distal pulses  Pulmonary :  Normal breath sounds, no stridor or respiratory distress   No rales, rhonchi, or wheezing  Abdominal:  Soft, nondistended, bowel sounds present.  No tenderness noted.   Musculoskeletal:  No lower extremity edema noted.  Lymph Nodes:  No cervical lymphadenopathy noted  Skin:  No cyanosis noted  Neurologic:  Alert, appropriate, moves all 4  extremities without obvious deficit.         Assessment & Plan:

## 2011-09-13 NOTE — Assessment & Plan Note (Signed)
The patient has a history of moderate central greater than obstructive sleep apnea in the past, and salt no difference being on CPAP.  However, he had significant mask issues at times, and it does not sound like he wore the device for any consistent period of time to see if things would improve.  Will hold off on treatment of this at this time, and work on some of his other sleep issues.

## 2011-09-13 NOTE — Patient Instructions (Addendum)
Will try a medication for your frequent leg jerks during the night.  Start requip 0.5mg   One after dinner each night.  If no change or only a partial response, would increase to 2 after dinner. Work on sleep hygiene as we discussed. Please call me in 3 weeks with your response to treatment.

## 2011-10-06 DIAGNOSIS — H3530 Unspecified macular degeneration: Secondary | ICD-10-CM | POA: Diagnosis not present

## 2011-10-24 ENCOUNTER — Telehealth: Payer: Self-pay | Admitting: Pulmonary Disease

## 2011-10-24 NOTE — Telephone Encounter (Signed)
Attempted to call pt but no machine to leave a message and no answer.  Will try back later.

## 2011-10-25 NOTE — Telephone Encounter (Signed)
Called home # NA and unable to leave VM.  Called mobile # and had to lmomtcb x1

## 2011-10-26 NOTE — Telephone Encounter (Signed)
LMTCB

## 2011-10-27 NOTE — Telephone Encounter (Signed)
LMTCB on cell number. Home number had busy signal. WCB. Carron Curie, CMA

## 2011-10-28 DIAGNOSIS — R070 Pain in throat: Secondary | ICD-10-CM | POA: Diagnosis not present

## 2011-10-28 DIAGNOSIS — D3709 Neoplasm of uncertain behavior of other specified sites of the oral cavity: Secondary | ICD-10-CM | POA: Diagnosis not present

## 2011-10-28 DIAGNOSIS — D3701 Neoplasm of uncertain behavior of lip: Secondary | ICD-10-CM | POA: Diagnosis not present

## 2011-10-28 NOTE — Telephone Encounter (Signed)
We can consider changing requip to mirapex 0.25mg , and take 2 after dinner to see if better tolerated.  Can send in one weeks worth if he wants to pay cash and not a copay, or can send in full month.  He needs to give me feedback how that does in a few weeks.  Doesn't require another sleep study unless he is going to go on cpap and we need to relook at pressure.

## 2011-10-28 NOTE — Telephone Encounter (Signed)
The pt states he is willing to try the Requip again. He had stopped taking this medication in the evenings due to dizziness. When he tried taking it midday the dizziness was better. He would like to know [facility] recs for restarting the Requip  and also wants to know if a repeat sleep study is needed. KC, pls advise.

## 2011-10-28 NOTE — Telephone Encounter (Signed)
Let him know that his sleep study did show very large numbers of leg jerks, and I suspect this is his main issue.  The sleep apnea may be contributing as well, and may have component of insomnia.  We can try a different medication for his restless legs, or we can try cpap again (he has not been tolerant in the past).   Let me know what he decides.

## 2011-10-28 NOTE — Telephone Encounter (Signed)
lmomtcb x1 

## 2011-10-28 NOTE — Telephone Encounter (Signed)
I spoke with Philip [name] and he stated he stopped requip 10/15/11. It was causing him to feel dizzy all day long. He does not feel like his RL is the problem. He states at night he has a little twitch in his foot but then goes away. He stated his problem is he wakes up about 1-2 am and can't go back to sleep. He will be away this weekend so he would like a call back today. Please advise KC thanks

## 2011-10-31 DIAGNOSIS — G2581 Restless legs syndrome: Secondary | ICD-10-CM

## 2011-10-31 DIAGNOSIS — R634 Abnormal weight loss: Secondary | ICD-10-CM | POA: Diagnosis not present

## 2011-10-31 DIAGNOSIS — C099 Malignant neoplasm of tonsil, unspecified: Secondary | ICD-10-CM | POA: Diagnosis not present

## 2011-10-31 DIAGNOSIS — M79609 Pain in unspecified limb: Secondary | ICD-10-CM | POA: Diagnosis not present

## 2011-10-31 DIAGNOSIS — I10 Essential (primary) hypertension: Secondary | ICD-10-CM | POA: Diagnosis not present

## 2011-10-31 HISTORY — DX: Restless legs syndrome: G25.81

## 2011-10-31 NOTE — Telephone Encounter (Signed)
Called home number, NA, no voicemail. I [email or web address] on mobile #. Carron Curie, CMA

## 2011-11-01 ENCOUNTER — Encounter (HOSPITAL_BASED_OUTPATIENT_CLINIC_OR_DEPARTMENT_OTHER): Admission: RE | Payer: Self-pay | Source: Ambulatory Visit

## 2011-11-01 ENCOUNTER — Ambulatory Visit (HOSPITAL_BASED_OUTPATIENT_CLINIC_OR_DEPARTMENT_OTHER): Admission: RE | Admit: 2011-11-01 | Payer: Medicare Other | Source: Ambulatory Visit | Admitting: Otolaryngology

## 2011-11-01 SURGERY — TONSILLECTOMY
Anesthesia: General | Laterality: Left

## 2011-11-01 NOTE — Telephone Encounter (Signed)
lmomtcb x2 on cell NA on home # wcb

## 2011-11-02 NOTE — Telephone Encounter (Signed)
I spoke with the pt and advised of recs per [facility]. Pt states he will think it over and let us know. 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 He states he has been off of requip x 1 month and his wife reports he is not kicking during the night. Pt states he will call back if he decides to take mirapex. Carron Curie, CMA

## 2011-11-04 DIAGNOSIS — C4442 Squamous cell carcinoma of skin of scalp and neck: Secondary | ICD-10-CM | POA: Diagnosis not present

## 2011-11-07 DIAGNOSIS — R221 Localized swelling, mass and lump, neck: Secondary | ICD-10-CM | POA: Diagnosis not present

## 2011-11-07 DIAGNOSIS — R220 Localized swelling, mass and lump, head: Secondary | ICD-10-CM | POA: Diagnosis not present

## 2011-11-09 DIAGNOSIS — Z79899 Other long term (current) drug therapy: Secondary | ICD-10-CM | POA: Diagnosis not present

## 2011-11-09 DIAGNOSIS — C099 Malignant neoplasm of tonsil, unspecified: Secondary | ICD-10-CM | POA: Diagnosis not present

## 2011-11-09 DIAGNOSIS — Z8546 Personal history of malignant neoplasm of prostate: Secondary | ICD-10-CM | POA: Diagnosis not present

## 2011-11-09 DIAGNOSIS — C7800 Secondary malignant neoplasm of unspecified lung: Secondary | ICD-10-CM | POA: Diagnosis not present

## 2011-11-09 DIAGNOSIS — M479 Spondylosis, unspecified: Secondary | ICD-10-CM | POA: Diagnosis not present

## 2011-11-09 DIAGNOSIS — R918 Other nonspecific abnormal finding of lung field: Secondary | ICD-10-CM | POA: Diagnosis not present

## 2011-11-09 DIAGNOSIS — G4733 Obstructive sleep apnea (adult) (pediatric): Secondary | ICD-10-CM | POA: Diagnosis not present

## 2011-11-09 DIAGNOSIS — L570 Actinic keratosis: Secondary | ICD-10-CM | POA: Diagnosis not present

## 2011-11-09 DIAGNOSIS — G4700 Insomnia, unspecified: Secondary | ICD-10-CM | POA: Diagnosis not present

## 2011-11-09 DIAGNOSIS — R221 Localized swelling, mass and lump, neck: Secondary | ICD-10-CM | POA: Diagnosis not present

## 2011-11-09 DIAGNOSIS — C801 Malignant (primary) neoplasm, unspecified: Secondary | ICD-10-CM | POA: Diagnosis not present

## 2011-11-09 DIAGNOSIS — R911 Solitary pulmonary nodule: Secondary | ICD-10-CM | POA: Diagnosis not present

## 2011-11-09 DIAGNOSIS — R599 Enlarged lymph nodes, unspecified: Secondary | ICD-10-CM | POA: Diagnosis not present

## 2011-11-10 DIAGNOSIS — K229 Disease of esophagus, unspecified: Secondary | ICD-10-CM | POA: Diagnosis not present

## 2011-11-10 DIAGNOSIS — R221 Localized swelling, mass and lump, neck: Secondary | ICD-10-CM | POA: Diagnosis not present

## 2011-11-10 DIAGNOSIS — Z79899 Other long term (current) drug therapy: Secondary | ICD-10-CM | POA: Diagnosis not present

## 2011-11-10 DIAGNOSIS — R220 Localized swelling, mass and lump, head: Secondary | ICD-10-CM | POA: Diagnosis not present

## 2011-11-10 DIAGNOSIS — G4733 Obstructive sleep apnea (adult) (pediatric): Secondary | ICD-10-CM | POA: Diagnosis not present

## 2011-11-10 DIAGNOSIS — C099 Malignant neoplasm of tonsil, unspecified: Secondary | ICD-10-CM | POA: Diagnosis not present

## 2011-11-16 DIAGNOSIS — C099 Malignant neoplasm of tonsil, unspecified: Secondary | ICD-10-CM | POA: Diagnosis not present

## 2011-11-21 DIAGNOSIS — D485 Neoplasm of uncertain behavior of skin: Secondary | ICD-10-CM | POA: Diagnosis not present

## 2011-11-21 DIAGNOSIS — Z85828 Personal history of other malignant neoplasm of skin: Secondary | ICD-10-CM | POA: Diagnosis not present

## 2011-11-21 DIAGNOSIS — L570 Actinic keratosis: Secondary | ICD-10-CM | POA: Diagnosis not present

## 2011-11-22 DIAGNOSIS — C099 Malignant neoplasm of tonsil, unspecified: Secondary | ICD-10-CM | POA: Diagnosis not present

## 2011-11-22 DIAGNOSIS — R599 Enlarged lymph nodes, unspecified: Secondary | ICD-10-CM | POA: Diagnosis not present

## 2011-11-29 DIAGNOSIS — H9190 Unspecified hearing loss, unspecified ear: Secondary | ICD-10-CM | POA: Diagnosis not present

## 2011-11-29 DIAGNOSIS — C099 Malignant neoplasm of tonsil, unspecified: Secondary | ICD-10-CM | POA: Diagnosis not present

## 2011-11-29 DIAGNOSIS — C770 Secondary and unspecified malignant neoplasm of lymph nodes of head, face and neck: Secondary | ICD-10-CM | POA: Diagnosis not present

## 2011-11-29 DIAGNOSIS — C801 Malignant (primary) neoplasm, unspecified: Secondary | ICD-10-CM | POA: Diagnosis not present

## 2011-11-29 DIAGNOSIS — Z79899 Other long term (current) drug therapy: Secondary | ICD-10-CM | POA: Diagnosis not present

## 2011-12-05 DIAGNOSIS — C770 Secondary and unspecified malignant neoplasm of lymph nodes of head, face and neck: Secondary | ICD-10-CM | POA: Diagnosis not present

## 2011-12-05 DIAGNOSIS — C50919 Malignant neoplasm of unspecified site of unspecified female breast: Secondary | ICD-10-CM | POA: Diagnosis not present

## 2011-12-05 DIAGNOSIS — C801 Malignant (primary) neoplasm, unspecified: Secondary | ICD-10-CM | POA: Diagnosis not present

## 2011-12-05 DIAGNOSIS — C099 Malignant neoplasm of tonsil, unspecified: Secondary | ICD-10-CM | POA: Diagnosis not present

## 2011-12-06 DIAGNOSIS — C801 Malignant (primary) neoplasm, unspecified: Secondary | ICD-10-CM | POA: Diagnosis not present

## 2011-12-06 DIAGNOSIS — Z510 Encounter for antineoplastic radiation therapy: Secondary | ICD-10-CM | POA: Diagnosis not present

## 2011-12-06 DIAGNOSIS — C099 Malignant neoplasm of tonsil, unspecified: Secondary | ICD-10-CM | POA: Diagnosis not present

## 2011-12-06 DIAGNOSIS — C770 Secondary and unspecified malignant neoplasm of lymph nodes of head, face and neck: Secondary | ICD-10-CM | POA: Diagnosis not present

## 2011-12-07 DIAGNOSIS — Z510 Encounter for antineoplastic radiation therapy: Secondary | ICD-10-CM | POA: Diagnosis not present

## 2011-12-07 DIAGNOSIS — C801 Malignant (primary) neoplasm, unspecified: Secondary | ICD-10-CM | POA: Diagnosis not present

## 2011-12-07 DIAGNOSIS — C770 Secondary and unspecified malignant neoplasm of lymph nodes of head, face and neck: Secondary | ICD-10-CM | POA: Diagnosis not present

## 2011-12-07 DIAGNOSIS — C099 Malignant neoplasm of tonsil, unspecified: Secondary | ICD-10-CM | POA: Diagnosis not present

## 2011-12-07 DIAGNOSIS — Z5111 Encounter for antineoplastic chemotherapy: Secondary | ICD-10-CM | POA: Diagnosis not present

## 2011-12-08 DIAGNOSIS — Z510 Encounter for antineoplastic radiation therapy: Secondary | ICD-10-CM | POA: Diagnosis not present

## 2011-12-08 DIAGNOSIS — C801 Malignant (primary) neoplasm, unspecified: Secondary | ICD-10-CM | POA: Diagnosis not present

## 2011-12-08 DIAGNOSIS — C099 Malignant neoplasm of tonsil, unspecified: Secondary | ICD-10-CM | POA: Diagnosis not present

## 2011-12-08 DIAGNOSIS — C770 Secondary and unspecified malignant neoplasm of lymph nodes of head, face and neck: Secondary | ICD-10-CM | POA: Diagnosis not present

## 2011-12-09 DIAGNOSIS — C099 Malignant neoplasm of tonsil, unspecified: Secondary | ICD-10-CM | POA: Diagnosis not present

## 2011-12-09 DIAGNOSIS — Z510 Encounter for antineoplastic radiation therapy: Secondary | ICD-10-CM | POA: Diagnosis not present

## 2011-12-11 DIAGNOSIS — C099 Malignant neoplasm of tonsil, unspecified: Secondary | ICD-10-CM | POA: Diagnosis not present

## 2011-12-11 DIAGNOSIS — C770 Secondary and unspecified malignant neoplasm of lymph nodes of head, face and neck: Secondary | ICD-10-CM | POA: Diagnosis not present

## 2011-12-11 DIAGNOSIS — Z510 Encounter for antineoplastic radiation therapy: Secondary | ICD-10-CM | POA: Diagnosis not present

## 2011-12-12 DIAGNOSIS — C099 Malignant neoplasm of tonsil, unspecified: Secondary | ICD-10-CM | POA: Diagnosis not present

## 2011-12-12 DIAGNOSIS — C801 Malignant (primary) neoplasm, unspecified: Secondary | ICD-10-CM | POA: Diagnosis not present

## 2011-12-12 DIAGNOSIS — C770 Secondary and unspecified malignant neoplasm of lymph nodes of head, face and neck: Secondary | ICD-10-CM | POA: Diagnosis not present

## 2011-12-12 DIAGNOSIS — Z510 Encounter for antineoplastic radiation therapy: Secondary | ICD-10-CM | POA: Diagnosis not present

## 2011-12-13 DIAGNOSIS — C099 Malignant neoplasm of tonsil, unspecified: Secondary | ICD-10-CM | POA: Diagnosis not present

## 2011-12-13 DIAGNOSIS — Z510 Encounter for antineoplastic radiation therapy: Secondary | ICD-10-CM | POA: Diagnosis not present

## 2011-12-13 DIAGNOSIS — C50919 Malignant neoplasm of unspecified site of unspecified female breast: Secondary | ICD-10-CM | POA: Diagnosis not present

## 2011-12-14 DIAGNOSIS — Z5111 Encounter for antineoplastic chemotherapy: Secondary | ICD-10-CM | POA: Diagnosis not present

## 2011-12-14 DIAGNOSIS — R070 Pain in throat: Secondary | ICD-10-CM | POA: Diagnosis not present

## 2011-12-14 DIAGNOSIS — C760 Malignant neoplasm of head, face and neck: Secondary | ICD-10-CM | POA: Diagnosis not present

## 2011-12-14 DIAGNOSIS — C801 Malignant (primary) neoplasm, unspecified: Secondary | ICD-10-CM | POA: Diagnosis not present

## 2011-12-14 DIAGNOSIS — R911 Solitary pulmonary nodule: Secondary | ICD-10-CM | POA: Diagnosis not present

## 2011-12-14 DIAGNOSIS — C770 Secondary and unspecified malignant neoplasm of lymph nodes of head, face and neck: Secondary | ICD-10-CM | POA: Diagnosis not present

## 2011-12-14 DIAGNOSIS — C099 Malignant neoplasm of tonsil, unspecified: Secondary | ICD-10-CM | POA: Diagnosis not present

## 2011-12-14 DIAGNOSIS — N3289 Other specified disorders of bladder: Secondary | ICD-10-CM | POA: Diagnosis not present

## 2011-12-14 DIAGNOSIS — H9190 Unspecified hearing loss, unspecified ear: Secondary | ICD-10-CM | POA: Diagnosis not present

## 2011-12-14 DIAGNOSIS — Z510 Encounter for antineoplastic radiation therapy: Secondary | ICD-10-CM | POA: Diagnosis not present

## 2011-12-19 DIAGNOSIS — Z510 Encounter for antineoplastic radiation therapy: Secondary | ICD-10-CM | POA: Diagnosis not present

## 2011-12-19 DIAGNOSIS — C099 Malignant neoplasm of tonsil, unspecified: Secondary | ICD-10-CM | POA: Diagnosis not present

## 2011-12-19 DIAGNOSIS — C770 Secondary and unspecified malignant neoplasm of lymph nodes of head, face and neck: Secondary | ICD-10-CM | POA: Diagnosis not present

## 2011-12-19 DIAGNOSIS — C801 Malignant (primary) neoplasm, unspecified: Secondary | ICD-10-CM | POA: Diagnosis not present

## 2011-12-20 DIAGNOSIS — Z510 Encounter for antineoplastic radiation therapy: Secondary | ICD-10-CM | POA: Diagnosis not present

## 2011-12-20 DIAGNOSIS — C099 Malignant neoplasm of tonsil, unspecified: Secondary | ICD-10-CM | POA: Diagnosis not present

## 2011-12-21 DIAGNOSIS — Z79899 Other long term (current) drug therapy: Secondary | ICD-10-CM | POA: Diagnosis not present

## 2011-12-21 DIAGNOSIS — Z5111 Encounter for antineoplastic chemotherapy: Secondary | ICD-10-CM | POA: Diagnosis not present

## 2011-12-21 DIAGNOSIS — R070 Pain in throat: Secondary | ICD-10-CM | POA: Diagnosis not present

## 2011-12-21 DIAGNOSIS — G4700 Insomnia, unspecified: Secondary | ICD-10-CM | POA: Diagnosis not present

## 2011-12-21 DIAGNOSIS — C770 Secondary and unspecified malignant neoplasm of lymph nodes of head, face and neck: Secondary | ICD-10-CM | POA: Diagnosis not present

## 2011-12-21 DIAGNOSIS — K5900 Constipation, unspecified: Secondary | ICD-10-CM | POA: Diagnosis not present

## 2011-12-21 DIAGNOSIS — C801 Malignant (primary) neoplasm, unspecified: Secondary | ICD-10-CM | POA: Diagnosis not present

## 2011-12-21 DIAGNOSIS — N3289 Other specified disorders of bladder: Secondary | ICD-10-CM | POA: Diagnosis not present

## 2011-12-21 DIAGNOSIS — C099 Malignant neoplasm of tonsil, unspecified: Secondary | ICD-10-CM | POA: Diagnosis not present

## 2011-12-21 DIAGNOSIS — Z510 Encounter for antineoplastic radiation therapy: Secondary | ICD-10-CM | POA: Diagnosis not present

## 2011-12-22 DIAGNOSIS — C099 Malignant neoplasm of tonsil, unspecified: Secondary | ICD-10-CM | POA: Diagnosis not present

## 2011-12-22 DIAGNOSIS — C801 Malignant (primary) neoplasm, unspecified: Secondary | ICD-10-CM | POA: Diagnosis not present

## 2011-12-22 DIAGNOSIS — C770 Secondary and unspecified malignant neoplasm of lymph nodes of head, face and neck: Secondary | ICD-10-CM | POA: Diagnosis not present

## 2011-12-22 DIAGNOSIS — Z510 Encounter for antineoplastic radiation therapy: Secondary | ICD-10-CM | POA: Diagnosis not present

## 2011-12-23 DIAGNOSIS — Z510 Encounter for antineoplastic radiation therapy: Secondary | ICD-10-CM | POA: Diagnosis not present

## 2011-12-23 DIAGNOSIS — C099 Malignant neoplasm of tonsil, unspecified: Secondary | ICD-10-CM | POA: Diagnosis not present

## 2011-12-26 DIAGNOSIS — C770 Secondary and unspecified malignant neoplasm of lymph nodes of head, face and neck: Secondary | ICD-10-CM | POA: Diagnosis not present

## 2011-12-26 DIAGNOSIS — C801 Malignant (primary) neoplasm, unspecified: Secondary | ICD-10-CM | POA: Diagnosis not present

## 2011-12-26 DIAGNOSIS — C099 Malignant neoplasm of tonsil, unspecified: Secondary | ICD-10-CM | POA: Diagnosis not present

## 2011-12-26 DIAGNOSIS — Z510 Encounter for antineoplastic radiation therapy: Secondary | ICD-10-CM | POA: Diagnosis not present

## 2011-12-27 DIAGNOSIS — Z510 Encounter for antineoplastic radiation therapy: Secondary | ICD-10-CM | POA: Diagnosis not present

## 2011-12-27 DIAGNOSIS — K5900 Constipation, unspecified: Secondary | ICD-10-CM | POA: Diagnosis not present

## 2011-12-27 DIAGNOSIS — F411 Generalized anxiety disorder: Secondary | ICD-10-CM | POA: Diagnosis not present

## 2011-12-27 DIAGNOSIS — K117 Disturbances of salivary secretion: Secondary | ICD-10-CM | POA: Diagnosis not present

## 2011-12-27 DIAGNOSIS — C801 Malignant (primary) neoplasm, unspecified: Secondary | ICD-10-CM | POA: Diagnosis not present

## 2011-12-27 DIAGNOSIS — C770 Secondary and unspecified malignant neoplasm of lymph nodes of head, face and neck: Secondary | ICD-10-CM | POA: Diagnosis not present

## 2011-12-27 DIAGNOSIS — C099 Malignant neoplasm of tonsil, unspecified: Secondary | ICD-10-CM | POA: Diagnosis not present

## 2011-12-28 DIAGNOSIS — Z510 Encounter for antineoplastic radiation therapy: Secondary | ICD-10-CM | POA: Diagnosis not present

## 2011-12-28 DIAGNOSIS — R070 Pain in throat: Secondary | ICD-10-CM | POA: Diagnosis not present

## 2011-12-28 DIAGNOSIS — Z5111 Encounter for antineoplastic chemotherapy: Secondary | ICD-10-CM | POA: Diagnosis not present

## 2011-12-28 DIAGNOSIS — R21 Rash and other nonspecific skin eruption: Secondary | ICD-10-CM | POA: Diagnosis not present

## 2011-12-28 DIAGNOSIS — C099 Malignant neoplasm of tonsil, unspecified: Secondary | ICD-10-CM | POA: Diagnosis not present

## 2011-12-28 DIAGNOSIS — C792 Secondary malignant neoplasm of skin: Secondary | ICD-10-CM | POA: Diagnosis not present

## 2011-12-29 DIAGNOSIS — K1231 Oral mucositis (ulcerative) due to antineoplastic therapy: Secondary | ICD-10-CM | POA: Diagnosis not present

## 2011-12-29 DIAGNOSIS — C099 Malignant neoplasm of tonsil, unspecified: Secondary | ICD-10-CM | POA: Diagnosis not present

## 2011-12-29 DIAGNOSIS — Z510 Encounter for antineoplastic radiation therapy: Secondary | ICD-10-CM | POA: Diagnosis not present

## 2011-12-29 DIAGNOSIS — C801 Malignant (primary) neoplasm, unspecified: Secondary | ICD-10-CM | POA: Diagnosis not present

## 2011-12-29 DIAGNOSIS — C770 Secondary and unspecified malignant neoplasm of lymph nodes of head, face and neck: Secondary | ICD-10-CM | POA: Diagnosis not present

## 2011-12-30 DIAGNOSIS — C099 Malignant neoplasm of tonsil, unspecified: Secondary | ICD-10-CM | POA: Diagnosis not present

## 2011-12-30 DIAGNOSIS — Z510 Encounter for antineoplastic radiation therapy: Secondary | ICD-10-CM | POA: Diagnosis not present

## 2012-01-01 DIAGNOSIS — R21 Rash and other nonspecific skin eruption: Secondary | ICD-10-CM | POA: Diagnosis not present

## 2012-01-01 DIAGNOSIS — R030 Elevated blood-pressure reading, without diagnosis of hypertension: Secondary | ICD-10-CM | POA: Diagnosis not present

## 2012-01-01 DIAGNOSIS — R112 Nausea with vomiting, unspecified: Secondary | ICD-10-CM | POA: Diagnosis not present

## 2012-01-01 DIAGNOSIS — Z5181 Encounter for therapeutic drug level monitoring: Secondary | ICD-10-CM | POA: Diagnosis not present

## 2012-01-01 DIAGNOSIS — C140 Malignant neoplasm of pharynx, unspecified: Secondary | ICD-10-CM | POA: Diagnosis not present

## 2012-01-01 DIAGNOSIS — Z79899 Other long term (current) drug therapy: Secondary | ICD-10-CM | POA: Diagnosis not present

## 2012-01-01 DIAGNOSIS — K5900 Constipation, unspecified: Secondary | ICD-10-CM | POA: Diagnosis not present

## 2012-01-02 DIAGNOSIS — C770 Secondary and unspecified malignant neoplasm of lymph nodes of head, face and neck: Secondary | ICD-10-CM | POA: Diagnosis not present

## 2012-01-02 DIAGNOSIS — Z510 Encounter for antineoplastic radiation therapy: Secondary | ICD-10-CM | POA: Diagnosis not present

## 2012-01-02 DIAGNOSIS — C099 Malignant neoplasm of tonsil, unspecified: Secondary | ICD-10-CM | POA: Diagnosis not present

## 2012-01-02 DIAGNOSIS — C801 Malignant (primary) neoplasm, unspecified: Secondary | ICD-10-CM | POA: Diagnosis not present

## 2012-01-03 DIAGNOSIS — Z510 Encounter for antineoplastic radiation therapy: Secondary | ICD-10-CM | POA: Diagnosis not present

## 2012-01-03 DIAGNOSIS — C099 Malignant neoplasm of tonsil, unspecified: Secondary | ICD-10-CM | POA: Diagnosis not present

## 2012-01-04 DIAGNOSIS — I951 Orthostatic hypotension: Secondary | ICD-10-CM | POA: Diagnosis not present

## 2012-01-04 DIAGNOSIS — E860 Dehydration: Secondary | ICD-10-CM | POA: Diagnosis not present

## 2012-01-04 DIAGNOSIS — Z510 Encounter for antineoplastic radiation therapy: Secondary | ICD-10-CM | POA: Diagnosis not present

## 2012-01-04 DIAGNOSIS — K1231 Oral mucositis (ulcerative) due to antineoplastic therapy: Secondary | ICD-10-CM | POA: Diagnosis not present

## 2012-01-04 DIAGNOSIS — C099 Malignant neoplasm of tonsil, unspecified: Secondary | ICD-10-CM | POA: Diagnosis not present

## 2012-01-04 DIAGNOSIS — C770 Secondary and unspecified malignant neoplasm of lymph nodes of head, face and neck: Secondary | ICD-10-CM | POA: Diagnosis not present

## 2012-01-04 DIAGNOSIS — C801 Malignant (primary) neoplasm, unspecified: Secondary | ICD-10-CM | POA: Diagnosis not present

## 2012-01-04 DIAGNOSIS — R52 Pain, unspecified: Secondary | ICD-10-CM | POA: Diagnosis not present

## 2012-01-05 DIAGNOSIS — Z510 Encounter for antineoplastic radiation therapy: Secondary | ICD-10-CM | POA: Diagnosis not present

## 2012-01-05 DIAGNOSIS — C099 Malignant neoplasm of tonsil, unspecified: Secondary | ICD-10-CM | POA: Diagnosis not present

## 2012-01-06 DIAGNOSIS — E860 Dehydration: Secondary | ICD-10-CM | POA: Diagnosis not present

## 2012-01-06 DIAGNOSIS — R110 Nausea: Secondary | ICD-10-CM | POA: Diagnosis not present

## 2012-01-06 DIAGNOSIS — K1231 Oral mucositis (ulcerative) due to antineoplastic therapy: Secondary | ICD-10-CM | POA: Diagnosis not present

## 2012-01-06 DIAGNOSIS — F411 Generalized anxiety disorder: Secondary | ICD-10-CM | POA: Diagnosis not present

## 2012-01-06 DIAGNOSIS — C770 Secondary and unspecified malignant neoplasm of lymph nodes of head, face and neck: Secondary | ICD-10-CM | POA: Diagnosis not present

## 2012-01-06 DIAGNOSIS — Z510 Encounter for antineoplastic radiation therapy: Secondary | ICD-10-CM | POA: Diagnosis not present

## 2012-01-06 DIAGNOSIS — C099 Malignant neoplasm of tonsil, unspecified: Secondary | ICD-10-CM | POA: Diagnosis not present

## 2012-01-06 DIAGNOSIS — K5900 Constipation, unspecified: Secondary | ICD-10-CM | POA: Diagnosis not present

## 2012-01-06 DIAGNOSIS — C801 Malignant (primary) neoplasm, unspecified: Secondary | ICD-10-CM | POA: Diagnosis not present

## 2012-01-09 DIAGNOSIS — C099 Malignant neoplasm of tonsil, unspecified: Secondary | ICD-10-CM | POA: Diagnosis not present

## 2012-01-09 DIAGNOSIS — C801 Malignant (primary) neoplasm, unspecified: Secondary | ICD-10-CM | POA: Diagnosis not present

## 2012-01-09 DIAGNOSIS — Z510 Encounter for antineoplastic radiation therapy: Secondary | ICD-10-CM | POA: Diagnosis not present

## 2012-01-09 DIAGNOSIS — C770 Secondary and unspecified malignant neoplasm of lymph nodes of head, face and neck: Secondary | ICD-10-CM | POA: Diagnosis not present

## 2012-01-10 DIAGNOSIS — C099 Malignant neoplasm of tonsil, unspecified: Secondary | ICD-10-CM | POA: Diagnosis not present

## 2012-01-10 DIAGNOSIS — Z510 Encounter for antineoplastic radiation therapy: Secondary | ICD-10-CM | POA: Diagnosis not present

## 2012-01-12 DIAGNOSIS — C770 Secondary and unspecified malignant neoplasm of lymph nodes of head, face and neck: Secondary | ICD-10-CM | POA: Diagnosis not present

## 2012-01-12 DIAGNOSIS — Z510 Encounter for antineoplastic radiation therapy: Secondary | ICD-10-CM | POA: Diagnosis not present

## 2012-01-12 DIAGNOSIS — C801 Malignant (primary) neoplasm, unspecified: Secondary | ICD-10-CM | POA: Diagnosis not present

## 2012-01-12 DIAGNOSIS — C099 Malignant neoplasm of tonsil, unspecified: Secondary | ICD-10-CM | POA: Diagnosis not present

## 2012-01-13 DIAGNOSIS — Z510 Encounter for antineoplastic radiation therapy: Secondary | ICD-10-CM | POA: Diagnosis not present

## 2012-01-13 DIAGNOSIS — C099 Malignant neoplasm of tonsil, unspecified: Secondary | ICD-10-CM | POA: Diagnosis not present

## 2012-01-16 DIAGNOSIS — C770 Secondary and unspecified malignant neoplasm of lymph nodes of head, face and neck: Secondary | ICD-10-CM | POA: Diagnosis not present

## 2012-01-16 DIAGNOSIS — Z510 Encounter for antineoplastic radiation therapy: Secondary | ICD-10-CM | POA: Diagnosis not present

## 2012-01-16 DIAGNOSIS — C099 Malignant neoplasm of tonsil, unspecified: Secondary | ICD-10-CM | POA: Diagnosis not present

## 2012-01-16 DIAGNOSIS — C801 Malignant (primary) neoplasm, unspecified: Secondary | ICD-10-CM | POA: Diagnosis not present

## 2012-01-17 DIAGNOSIS — C099 Malignant neoplasm of tonsil, unspecified: Secondary | ICD-10-CM | POA: Diagnosis not present

## 2012-01-17 DIAGNOSIS — Z510 Encounter for antineoplastic radiation therapy: Secondary | ICD-10-CM | POA: Diagnosis not present

## 2012-01-19 DIAGNOSIS — C770 Secondary and unspecified malignant neoplasm of lymph nodes of head, face and neck: Secondary | ICD-10-CM | POA: Diagnosis not present

## 2012-01-19 DIAGNOSIS — C099 Malignant neoplasm of tonsil, unspecified: Secondary | ICD-10-CM | POA: Diagnosis not present

## 2012-01-19 DIAGNOSIS — B370 Candidal stomatitis: Secondary | ICD-10-CM | POA: Diagnosis not present

## 2012-01-19 DIAGNOSIS — C801 Malignant (primary) neoplasm, unspecified: Secondary | ICD-10-CM | POA: Diagnosis not present

## 2012-01-19 DIAGNOSIS — Z510 Encounter for antineoplastic radiation therapy: Secondary | ICD-10-CM | POA: Diagnosis not present

## 2012-01-20 DIAGNOSIS — Z510 Encounter for antineoplastic radiation therapy: Secondary | ICD-10-CM | POA: Diagnosis not present

## 2012-01-20 DIAGNOSIS — C099 Malignant neoplasm of tonsil, unspecified: Secondary | ICD-10-CM | POA: Diagnosis not present

## 2012-01-20 DIAGNOSIS — C760 Malignant neoplasm of head, face and neck: Secondary | ICD-10-CM | POA: Diagnosis not present

## 2012-01-23 DIAGNOSIS — Z510 Encounter for antineoplastic radiation therapy: Secondary | ICD-10-CM | POA: Diagnosis not present

## 2012-01-23 DIAGNOSIS — C099 Malignant neoplasm of tonsil, unspecified: Secondary | ICD-10-CM | POA: Diagnosis not present

## 2012-01-23 DIAGNOSIS — C801 Malignant (primary) neoplasm, unspecified: Secondary | ICD-10-CM | POA: Diagnosis not present

## 2012-01-23 DIAGNOSIS — R21 Rash and other nonspecific skin eruption: Secondary | ICD-10-CM | POA: Diagnosis not present

## 2012-01-23 DIAGNOSIS — C770 Secondary and unspecified malignant neoplasm of lymph nodes of head, face and neck: Secondary | ICD-10-CM | POA: Diagnosis not present

## 2012-01-24 DIAGNOSIS — C099 Malignant neoplasm of tonsil, unspecified: Secondary | ICD-10-CM | POA: Diagnosis not present

## 2012-01-24 DIAGNOSIS — Z510 Encounter for antineoplastic radiation therapy: Secondary | ICD-10-CM | POA: Diagnosis not present

## 2012-01-25 DIAGNOSIS — C099 Malignant neoplasm of tonsil, unspecified: Secondary | ICD-10-CM | POA: Diagnosis not present

## 2012-01-25 DIAGNOSIS — Z510 Encounter for antineoplastic radiation therapy: Secondary | ICD-10-CM | POA: Diagnosis not present

## 2012-01-26 DIAGNOSIS — C801 Malignant (primary) neoplasm, unspecified: Secondary | ICD-10-CM | POA: Diagnosis not present

## 2012-01-26 DIAGNOSIS — C770 Secondary and unspecified malignant neoplasm of lymph nodes of head, face and neck: Secondary | ICD-10-CM | POA: Diagnosis not present

## 2012-01-26 DIAGNOSIS — C099 Malignant neoplasm of tonsil, unspecified: Secondary | ICD-10-CM | POA: Diagnosis not present

## 2012-01-26 DIAGNOSIS — Z510 Encounter for antineoplastic radiation therapy: Secondary | ICD-10-CM | POA: Diagnosis not present

## 2012-01-31 DIAGNOSIS — C099 Malignant neoplasm of tonsil, unspecified: Secondary | ICD-10-CM | POA: Diagnosis not present

## 2012-02-06 DIAGNOSIS — D485 Neoplasm of uncertain behavior of skin: Secondary | ICD-10-CM | POA: Diagnosis not present

## 2012-02-06 DIAGNOSIS — C44211 Basal cell carcinoma of skin of unspecified ear and external auricular canal: Secondary | ICD-10-CM | POA: Diagnosis not present

## 2012-02-15 DIAGNOSIS — C770 Secondary and unspecified malignant neoplasm of lymph nodes of head, face and neck: Secondary | ICD-10-CM | POA: Diagnosis not present

## 2012-02-15 DIAGNOSIS — C099 Malignant neoplasm of tonsil, unspecified: Secondary | ICD-10-CM | POA: Diagnosis not present

## 2012-02-15 DIAGNOSIS — C801 Malignant (primary) neoplasm, unspecified: Secondary | ICD-10-CM | POA: Diagnosis not present

## 2012-02-17 DIAGNOSIS — C801 Malignant (primary) neoplasm, unspecified: Secondary | ICD-10-CM | POA: Diagnosis not present

## 2012-03-14 DIAGNOSIS — C44211 Basal cell carcinoma of skin of unspecified ear and external auricular canal: Secondary | ICD-10-CM | POA: Diagnosis not present

## 2012-04-11 DIAGNOSIS — H3530 Unspecified macular degeneration: Secondary | ICD-10-CM | POA: Diagnosis not present

## 2012-04-11 DIAGNOSIS — Z961 Presence of intraocular lens: Secondary | ICD-10-CM | POA: Diagnosis not present

## 2012-04-11 DIAGNOSIS — H532 Diplopia: Secondary | ICD-10-CM | POA: Diagnosis not present

## 2012-04-24 DIAGNOSIS — Z923 Personal history of irradiation: Secondary | ICD-10-CM | POA: Diagnosis not present

## 2012-04-24 DIAGNOSIS — K117 Disturbances of salivary secretion: Secondary | ICD-10-CM | POA: Diagnosis not present

## 2012-04-24 DIAGNOSIS — C801 Malignant (primary) neoplasm, unspecified: Secondary | ICD-10-CM | POA: Diagnosis not present

## 2012-04-24 DIAGNOSIS — Z85819 Personal history of malignant neoplasm of unspecified site of lip, oral cavity, and pharynx: Secondary | ICD-10-CM | POA: Diagnosis not present

## 2012-04-24 DIAGNOSIS — Z9221 Personal history of antineoplastic chemotherapy: Secondary | ICD-10-CM | POA: Diagnosis not present

## 2012-04-24 DIAGNOSIS — C770 Secondary and unspecified malignant neoplasm of lymph nodes of head, face and neck: Secondary | ICD-10-CM | POA: Diagnosis not present

## 2012-04-24 DIAGNOSIS — C099 Malignant neoplasm of tonsil, unspecified: Secondary | ICD-10-CM | POA: Diagnosis not present

## 2012-05-07 DIAGNOSIS — R339 Retention of urine, unspecified: Secondary | ICD-10-CM | POA: Diagnosis not present

## 2012-05-07 DIAGNOSIS — N35919 Unspecified urethral stricture, male, unspecified site: Secondary | ICD-10-CM | POA: Diagnosis not present

## 2012-05-08 ENCOUNTER — Other Ambulatory Visit: Payer: Self-pay | Admitting: Urology

## 2012-05-14 DIAGNOSIS — N35919 Unspecified urethral stricture, male, unspecified site: Secondary | ICD-10-CM | POA: Diagnosis not present

## 2012-05-18 ENCOUNTER — Encounter (HOSPITAL_BASED_OUTPATIENT_CLINIC_OR_DEPARTMENT_OTHER): Payer: Self-pay | Admitting: *Deleted

## 2012-05-18 NOTE — Progress Notes (Signed)
NPO AFTER MN. ARRIVES AT 1030. NEEDS ISTAT. CURRENT EKG IN EPIC AND CHART. WILL REQUEST LAST PET AND OR CT SCAN , AND LOV NOTE FROM DR Genella Mech AT [facility]. POSSIBLE MAY NEED SIGNED RELEASE FROM PT., IF SO FORM WILL BE FILLED OUT AND PLACED IN  CHART WITH FAX #.

## 2012-05-21 NOTE — H&P (Signed)
History of Present Illness   Philip [name] came in for reassessment. They had a number of questions they wanted to ask, including natural history and moving forward, and how well if the dilation worked. They wanted to see if there were any false passages.  Flexible Cystoscopy: After verbal consent flexible cystoscopy was performed with sterile technique. Penile, bulbar urethra were normal. He did have a rigid bladder neck area with a lobe of white tissue pushing from the right side. I could see half of a normal lumen on his left between 12 and 6 o'clock, but I did not want to push through it and cause bleeding. I was somewhat impressed on the rigidity of the tissue and the fact that it was a bit narrow.   When I cystoscoped him in May 2013, the prostatic urethra was irregular and a bit narrow and he had an elevated bladder neck.  On cystoscopy, he had no spongiofibrosis distally. Within the narrow prostatic urethra I did not see any false passage there or distal.   Review of Systems: No change in bowel or neurologic systems.   Questions above answered.    Past Medical History Problems  1. History of  Adult Sleep Apnea 780.57 2. History of  Depression 311 3. History of  Laryngeal Cancer V10.21 4. History of  Prostate Cancer V10.46  Surgical History Problems  1. History of  Cystoscopy For Urethral Stricture 2. History of  Cystoscopy For Urethral Stricture 3. History of  Hernia Repair 4. History of  Urethra Surgery  Current Meds 1. Acetyl L-Carnitine CAPS; Therapy: (Recorded:11Jan2013) to 2. Arginine TABS; Therapy: (Recorded:11Jan2013) to 3. Bromelain TABS; Therapy: (Recorded:11Jan2013) to 4. Calcium TABS; Therapy: (Recorded:11Jan2013) to 5. Chondroitin Sulfate CAPS; Therapy: (Recorded:11Jan2013) to 6. Ciprofloxacin HCl 250 MG Oral Tablet; Take 1 tablet twice daily; Therapy: 21Apr2014 to  (Evaluate:24Apr2014); Last Rx:21Apr2014 7. Collagen CAPS; Therapy: (Recorded:11Jan2013) to 8.  CoQ-10 10 MG Oral Capsule; Therapy: (Recorded:11Jan2013) to 9. DHA Complete CAPS; Therapy: (Recorded:11Jan2013) to 10. Digestive Enzymes Oral Tablet; Therapy: (Recorded:11Jan2013) to 11. EPA Plus CAPS; Therapy: (Recorded:11Jan2013) to 12. Glucosamine CAPS; Therapy: (Recorded:11Jan2013) to 13. Hyaluronic Acid Powder; Therapy: (Recorded:11Jan2013) to 14. L-Arginine TABS; Therapy: (Recorded:11Jan2013) to 15. L-Citrulline CAPS; Therapy: (Recorded:11Jan2013) to 16. Magnesium TABS; Therapy: (Recorded:11Jan2013) to 17. MSM 1000 MG Oral Tablet; Therapy: (Recorded:11Jan2013) to 18. Multi-Vitamin TABS; Therapy: (Recorded:11Jan2013) to 19. Pepsin Powder; Therapy: (Recorded:11Jan2013) to 20. Potassium Chloride Powder; Therapy: (Recorded:11Jan2013) to 21. Quercetin TABS; Therapy: (Recorded:11Jan2013) to 22. Resveratrol 100 MG Oral Capsule; Therapy: (Recorded:11Jan2013) to 23. Silica CAPS; Therapy: (Recorded:11Jan2013) to 24. Strontium Chloride Crystals; Therapy: (Recorded:11Jan2013) to 25. Vanadium CAPS; Therapy: (Recorded:11Jan2013) to 26. Vitamin D CAPS; Therapy: (Recorded:11Jan2013) to 27. Vitamin D3 CAPS; Therapy: (Recorded:11Jan2013) to 28. Vitamin E CAPS; Therapy: (Recorded:11Jan2013) to 29. Vitamin K TABS; Therapy: (Recorded:11Jan2013) to  Allergies Medication  1. Suprax TABS  Family History Problems  1. Paternal history of  Class I Angina 2. Sororal history of  Class I Angina 3. Paternal history of  Death In The Family Father father passed @ age 69heart attack 4. Maternal history of  Death In The Family Mother mother passed @ age 8old age 79. Family history of  Family Health Status Number Of Children 2 daughters  Social History Problems  1. Alcohol Use one glass of wine 2. Caffeine Use 2 cups of coffee 3. Marital History - Currently Married 4. Never A Smoker 5. Occupation: Retired Industrial/product designer  6. History of  Tobacco Use  Vitals Vital Signs [Data Includes: Last  1  Day]  28Apr2014 01:28PM  Blood Pressure: 152 / 88 Temperature: 98 F Heart Rate: 59  Assessment Assessed  1. Urethral Stricture 598.9  Plan Urethral Stricture (598.9)  1. Cysto  Done: 28Apr2014  Discussion/Summary   I think it would be best for Mr. Patella to continue with a balloon dilation on May 6. He is going to be going to the [place] as noted.   I educated Mr. and Mrs. Javier that I do not think he needs a cystoscopy every year or dilated every year, and should only have it done when he is having ongoing or acute troubles with catheterizing. Acute retention with the inability to catheterize is always a risk. Rare risk of incontinence was discussed. He should not have a resection of tissues since incontinence risk is so high.  After a thorough review of the management options for the patient's condition the patient  elected to proceed with surgical therapy as noted above. We have discussed the potential benefits and risks of the procedure, side effects of the proposed treatment, the likelihood of the patient achieving the goals of the procedure, and any potential problems that might occur during the procedure or recuperation. Informed consent has been obtained.

## 2012-05-22 ENCOUNTER — Encounter (HOSPITAL_BASED_OUTPATIENT_CLINIC_OR_DEPARTMENT_OTHER)
Admission: RE | Disposition: A | Discharge status: Home / Self Care | Payer: Self-pay | Source: Ambulatory Visit | Attending: Urology

## 2012-05-22 ENCOUNTER — Ambulatory Visit (HOSPITAL_BASED_OUTPATIENT_CLINIC_OR_DEPARTMENT_OTHER): Payer: Medicare Other | Admitting: Anesthesiology

## 2012-05-22 ENCOUNTER — Encounter (HOSPITAL_BASED_OUTPATIENT_CLINIC_OR_DEPARTMENT_OTHER): Payer: Self-pay | Admitting: Anesthesiology

## 2012-05-22 ENCOUNTER — Encounter (HOSPITAL_BASED_OUTPATIENT_CLINIC_OR_DEPARTMENT_OTHER): Payer: Self-pay | Admitting: *Deleted

## 2012-05-22 ENCOUNTER — Ambulatory Visit (HOSPITAL_BASED_OUTPATIENT_CLINIC_OR_DEPARTMENT_OTHER)
Admission: RE | Admit: 2012-05-22 | Discharge: 2012-05-22 | Disposition: A | Discharge status: Home / Self Care | Payer: Medicare Other | Source: Ambulatory Visit | Attending: Urology | Admitting: Urology

## 2012-05-22 DIAGNOSIS — N3090 Cystitis, unspecified without hematuria: Secondary | ICD-10-CM | POA: Diagnosis not present

## 2012-05-22 DIAGNOSIS — I10 Essential (primary) hypertension: Secondary | ICD-10-CM | POA: Diagnosis not present

## 2012-05-22 DIAGNOSIS — N400 Enlarged prostate without lower urinary tract symptoms: Secondary | ICD-10-CM | POA: Diagnosis not present

## 2012-05-22 DIAGNOSIS — N3289 Other specified disorders of bladder: Secondary | ICD-10-CM | POA: Insufficient documentation

## 2012-05-22 DIAGNOSIS — Z8546 Personal history of malignant neoplasm of prostate: Secondary | ICD-10-CM | POA: Diagnosis not present

## 2012-05-22 DIAGNOSIS — Z8521 Personal history of malignant neoplasm of larynx: Secondary | ICD-10-CM | POA: Insufficient documentation

## 2012-05-22 DIAGNOSIS — G4730 Sleep apnea, unspecified: Secondary | ICD-10-CM | POA: Insufficient documentation

## 2012-05-22 DIAGNOSIS — N35919 Unspecified urethral stricture, male, unspecified site: Secondary | ICD-10-CM | POA: Insufficient documentation

## 2012-05-22 HISTORY — DX: Disturbances of salivary secretion: K11.7

## 2012-05-22 HISTORY — DX: Malignant neoplasm of tonsil, unspecified: C09.9

## 2012-05-22 HISTORY — DX: Personal history of irradiation: Z92.3

## 2012-05-22 HISTORY — PX: CYSTOSCOPY/RETROGRADE/URETEROSCOPY: SHX5316

## 2012-05-22 LAB — POCT I-STAT 4, (NA,K, GLUC, HGB,HCT)
Glucose, Bld: 81 mg/dL (ref 70–99)
HCT: 41 % (ref 39.0–52.0)
Potassium: 3.8 mEq/L (ref 3.5–5.1)
Sodium: 141 mEq/L (ref 135–145)

## 2012-05-22 SURGERY — CYSTOSCOPY/RETROGRADE/URETEROSCOPY
Anesthesia: Monitor Anesthesia Care | Wound class: Clean Contaminated

## 2012-05-22 MED ORDER — LIDOCAINE HCL (CARDIAC) 20 MG/ML IV SOLN
INTRAVENOUS | Status: DC | PRN
  Administered 2012-05-22: 70 mg via INTRAVENOUS

## 2012-05-22 MED ORDER — SODIUM CHLORIDE 0.9 % IR SOLN
Status: DC | PRN
  Administered 2012-05-22: 3000 mL via INTRAVESICAL

## 2012-05-22 MED ORDER — PROPOFOL 10 MG/ML IV BOLUS
INTRAVENOUS | Status: DC | PRN
  Administered 2012-05-22 (×3): 20 mg via INTRAVENOUS

## 2012-05-22 MED ORDER — CIPROFLOXACIN HCL 250 MG PO TABS: 250.0000 mg | ORAL_TABLET | Freq: Two times a day (BID) | ORAL | Status: DC

## 2012-05-22 MED ORDER — ONDANSETRON HCL 4 MG/2ML IJ SOLN
INTRAMUSCULAR | Status: DC | PRN
  Administered 2012-05-22: 4 mg via INTRAVENOUS

## 2012-05-22 MED ORDER — DEXAMETHASONE SODIUM PHOSPHATE 4 MG/ML IJ SOLN
INTRAMUSCULAR | Status: DC | PRN
  Administered 2012-05-22: 4 mg via INTRAVENOUS

## 2012-05-22 MED ORDER — PROPOFOL INFUSION 10 MG/ML OPTIME
INTRAVENOUS | Status: DC | PRN
  Administered 2012-05-22: 25 ug/kg/min via INTRAVENOUS

## 2012-05-22 MED ORDER — CIPROFLOXACIN IN D5W 400 MG/200ML IV SOLN
400.0000 mg | INTRAVENOUS | Status: AC
  Administered 2012-05-22: 400 mg via INTRAVENOUS
  Filled 2012-05-22: qty 200

## 2012-05-22 MED ORDER — HYDROCODONE-ACETAMINOPHEN 5-325 MG PO TABS: 1.0000 | ORAL_TABLET | Freq: Four times a day (QID) | ORAL | Status: DC | PRN

## 2012-05-22 MED ORDER — MIDAZOLAM HCL 5 MG/5ML IJ SOLN
INTRAMUSCULAR | Status: DC | PRN
  Administered 2012-05-22: 1 mg via INTRAVENOUS

## 2012-05-22 MED ORDER — IOHEXOL 350 MG/ML SOLN
INTRAVENOUS | Status: DC | PRN
  Administered 2012-05-22: 16 mL

## 2012-05-22 MED ORDER — LACTATED RINGERS IV SOLN
INTRAVENOUS | Status: DC
  Filled 2012-05-22: qty 1000

## 2012-05-22 MED ORDER — LACTATED RINGERS IV SOLN
INTRAVENOUS | Status: DC
  Administered 2012-05-22: 100 mL/h via INTRAVENOUS
  Filled 2012-05-22: qty 1000

## 2012-05-22 MED ORDER — 0.9 % SODIUM CHLORIDE (POUR BTL) OPTIME
TOPICAL | Status: DC | PRN
  Administered 2012-05-22: 1000 mL

## 2012-05-22 MED ORDER — FENTANYL CITRATE 0.05 MG/ML IJ SOLN
INTRAMUSCULAR | Status: DC | PRN
  Administered 2012-05-22 (×2): 25 ug via INTRAVENOUS

## 2012-05-22 MED ORDER — FENTANYL CITRATE 0.05 MG/ML IJ SOLN
25.0000 ug | INTRAMUSCULAR | Status: DC | PRN
  Filled 2012-05-22: qty 1

## 2012-05-22 SURGICAL SUPPLY — 25 items
ADAPTER CATH URET PLST 4-6FR (CATHETERS) IMPLANT
BAG DRAIN URO-CYSTO SKYTR STRL (DRAIN) ×2 IMPLANT
BALLN NEPHROSTOMY (BALLOONS) ×2
BALLOON NEPHROSTOMY (BALLOONS) ×1 IMPLANT
CANISTER SUCT LVC 12 LTR MEDI- (MISCELLANEOUS) ×2 IMPLANT
CATH INTERMIT  6FR 70CM (CATHETERS) IMPLANT
CATH URET 5FR 28IN CONE TIP (BALLOONS)
CATH URET 5FR 28IN OPEN ENDED (CATHETERS) IMPLANT
CATH URET 5FR 70CM CONE TIP (BALLOONS) IMPLANT
CLOTH BEACON ORANGE TIMEOUT ST (SAFETY) ×2 IMPLANT
DRAPE CAMERA CLOSED 9X96 (DRAPES) ×2 IMPLANT
GLOVE BIO SURGEON STRL SZ7.5 (GLOVE) ×2 IMPLANT
GOWN STRL REIN XL XLG (GOWN DISPOSABLE) ×2 IMPLANT
GUIDEWIRE 0.038 PTFE COATED (WIRE) IMPLANT
GUIDEWIRE ANG ZIPWIRE 038X150 (WIRE) IMPLANT
GUIDEWIRE STR DUAL SENSOR (WIRE) ×2 IMPLANT
IV NS IRRIG 3000ML ARTHROMATIC (IV SOLUTION) ×4 IMPLANT
KIT BALLIN UROMAX 15FX10 (LABEL) IMPLANT
KIT BALLN UROMAX 15FX4 (MISCELLANEOUS) IMPLANT
KIT BALLN UROMAX 26 75X4 (MISCELLANEOUS)
NS IRRIG 500ML POUR BTL (IV SOLUTION) ×2 IMPLANT
PACK CYSTOSCOPY (CUSTOM PROCEDURE TRAY) ×2 IMPLANT
SET HIGH PRES BAL DIL (LABEL)
SHEATH ACCESS URETERAL 38CM (SHEATH) IMPLANT
SHEATH ACCESS URETERAL 54CM (SHEATH) IMPLANT

## 2012-05-22 NOTE — Op Note (Signed)
Preoperative diagnosis: Bladder neck contracture and or membranous urethral stricture Postoperative diagnosis: Possible bladder neck contracture and primarily a membranous urethral stricture Surgery: Cystoscopy balloon dilation of stricture Surgeon: Dr. Lorin Picket Mackinze Criado  The patient has the above diagnoses and consented above procedure. He was done under MAC anesthesia. He's had radiation seeds. He self catheterizes.  17 [place] scope was utilized. Penile bulbar urethra looked normal. It looked like he had a membranous urethral stricture of approximately 14-16 [place].  Under cystoscopic guidance I passed a sensor wire easily curling up in the bladder fluoroscopically. I passed the balloon dilation catheter so the markers were just into the bladder neck.  Under fluoroscopic guidance I balloon dilated the prostatic urethra and membranous urethra and bladder neck for 5 minutes under 18 atmospheres of pressure. The balloon dilation catheter was then removed.  I re\re cystoscoped the patient along the wire easily into the bladder. He had mild cystitis with white flecks in the urine. Symptomatically he did not have a UTI. Trigone was normal. He had a high riding bladder neck minimally. He a grade 2/4 bladder trabeculation.  I carefully inspected the urethra as I pulled back on the scope slowly twice. He had a long prostatic urethra and bilobar enlargement of the prostate gland. There is no question to the membranous urethra had a stricture that had been dilated nicely to at least 24 [place]. At 6:00 he had a small false passage probably a centimeter wide and less than a centimeter in depth well epithelialized. The external sphincter did not look like it was functioning well it was difficult to say under anesthesia and by endoscopy  There was no bleeding. Patient was taken to recover room after emptying his bladder.  As noted before the patient should not have a TURP cc at very high risk of recurrence of  stricture and incontinence. It may be reasonable to consider to change his catheter to a coud tip and this will be discussed postoperatively.

## 2012-05-22 NOTE — Interval H&P Note (Signed)
History and Physical Interval Note:  05/22/2012 7:03 AM  Philip [name]  has presented today for surgery, with the diagnosis of URETHRAL STRICTURE   The various methods of treatment have been discussed with the patient and family. After consideration of risks, benefits and other options for treatment, the patient has consented to  Procedure(s): CYSTOSCOPY BALLOON DILATION RETROGRADE URETEROGRAM  (N/A) as a surgical intervention .  The patient's history has been reviewed, patient examined, no change in status, stable for surgery.  I have reviewed the patient's chart and labs.  Questions were answered to the patient's satisfaction.     [name]

## 2012-05-22 NOTE — Anesthesia Preprocedure Evaluation (Addendum)
Anesthesia Evaluation  Patient identified by MRN, date of birth, ID band Patient awake    Reviewed: Allergy & Precautions, H&P , NPO status , Patient's Chart, lab work & pertinent test results  Airway Mallampati: II TM Distance: >3 FB Neck ROM: full    Dental no notable dental hx. (+) Teeth Intact and Dental Advisory Given   Pulmonary sleep apnea ,  breath sounds clear to auscultation  Pulmonary exam normal       Cardiovascular Exercise Tolerance: Good hypertension, Pt. on medications Rhythm:regular Rate:Normal     Neuro/Psych negative neurological ROS  negative psych ROS   GI/Hepatic negative GI ROS, Neg liver ROS,   Endo/Other  negative endocrine ROS  Renal/GU negative Renal ROS  negative genitourinary   Musculoskeletal   Abdominal   Peds  Hematology negative hematology ROS (+)   Anesthesia Other Findings Radiation to soft palate and pharynx  Reproductive/Obstetrics negative OB ROS                          Anesthesia Physical Anesthesia Plan  ASA: III  Anesthesia Plan: MAC   Post-op Pain Management:    Induction:   Airway Management Planned: Simple Face Mask  Additional Equipment:   Intra-op Plan:   Post-operative Plan:   Informed Consent: I have reviewed the patients History and Physical, chart, labs and discussed the procedure including the risks, benefits and alternatives for the proposed anesthesia with the patient or authorized representative who has indicated his/her understanding and acceptance.   Dental Advisory Given  Plan Discussed with: CRNA and Surgeon  Anesthesia Plan Comments:        Anesthesia Quick Evaluation

## 2012-05-22 NOTE — Anesthesia Procedure Notes (Signed)
Procedure Name: MAC Date/Time: 05/22/2012 11:40 AM Performed by: Norva Pavlov Pre-anesthesia Checklist: Patient identified, Timeout performed, Emergency Drugs available, Suction available and Patient being monitored Patient Re-evaluated:Patient Re-evaluated prior to inductionOxygen Delivery Method: Simple face mask Preoxygenation: Pre-oxygenation with 100% oxygen Intubation Type: IV induction Airway Equipment and Method: Oral airway Placement Confirmation: positive ETCO2 and breath sounds checked- equal and bilateral

## 2012-05-22 NOTE — Anesthesia Postprocedure Evaluation (Signed)
  Anesthesia Post-op Note  Patient: Philip [name]  Procedure(s) Performed: Procedure(s) (LRB): CYSTOSCOPY BALLOON DILATION RETROGRADE URETEROGRAM  (N/A)  Patient Location: PACU  Anesthesia Type: MAC  Level of Consciousness: awake and alert   Airway and Oxygen Therapy: Patient Spontanous Breathing  Post-op Pain: mild  Post-op Assessment: Post-op Vital signs reviewed, Patient's Cardiovascular Status Stable, Respiratory Function Stable, Patent Airway and No signs of Nausea or vomiting  Last Vitals:  Filed Vitals:   05/22/12 1230  BP: 124/76  Pulse: 51  Temp:   Resp: 9    Post-op Vital Signs: stable   Complications: No apparent anesthesia complications

## 2012-05-22 NOTE — Transfer of Care (Signed)
Immediate Anesthesia Transfer of Care Note  Patient: Philip [name]  Procedure(s) Performed: Procedure(s) (LRB): CYSTOSCOPY BALLOON DILATION RETROGRADE URETEROGRAM  (N/A)  Patient Location: PACU  Anesthesia Type:MAC  Level of Consciousness: awake, alert  and oriented  Airway & Oxygen Therapy: Patient Spontanous Breathing and Patient connected to face mask oxygen  Post-op Assessment: Report given to PACU RN and Post -op Vital signs reviewed and stable  Post vital signs: Reviewed and stable  Complications: No apparent anesthesia complications

## 2012-05-23 ENCOUNTER — Encounter (HOSPITAL_BASED_OUTPATIENT_CLINIC_OR_DEPARTMENT_OTHER): Payer: Self-pay | Admitting: Urology

## 2012-06-06 DIAGNOSIS — R238 Other skin changes: Secondary | ICD-10-CM | POA: Diagnosis not present

## 2012-06-06 DIAGNOSIS — L570 Actinic keratosis: Secondary | ICD-10-CM | POA: Diagnosis not present

## 2012-06-06 DIAGNOSIS — L821 Other seborrheic keratosis: Secondary | ICD-10-CM | POA: Diagnosis not present

## 2012-06-06 DIAGNOSIS — D485 Neoplasm of uncertain behavior of skin: Secondary | ICD-10-CM | POA: Diagnosis not present

## 2012-06-20 ENCOUNTER — Non-Acute Institutional Stay: Payer: Medicare Other | Admitting: Geriatric Medicine

## 2012-06-20 VITALS — BP 138/76 | HR 72 | Temp 97.3°F | Ht 72.0 in | Wt 158.0 lb

## 2012-06-20 DIAGNOSIS — I10 Essential (primary) hypertension: Secondary | ICD-10-CM | POA: Insufficient documentation

## 2012-06-20 MED ORDER — LISINOPRIL 10 MG PO TABS: 10.0000 mg | ORAL_TABLET | Freq: Every day | ORAL | Status: DC

## 2012-06-20 NOTE — Progress Notes (Signed)
Patient ID: Philip [name], male   DOB: 1932-04-08, 79 y.o.   MRN: [id number] [facility] [phone])  Chief Complaint  Patient presents with  . Hypertension    blood pressure this morning taken by clinic nurse was 170/100, been high at home too. Tongue is sore.     HPI: This is a 79 y.o. male resident of [facility] Independent Living section.  Evaluation is requested today due to elevated blood pressure. Patient reports his blood pressure has been elevated for some time, he traces back to treatment for prostate cancer. He feels it is time to treat the elevated BP with medication. Patient denies any headaches, chest pain, shortness of breath. Reports that he takes his own blood pressure several times a week; immediately after rising this morning BP was 185/105. After he sat for a few minutes it dropped to 170/95. He was seen later in the morning by the clinic nurse who measures blood pressure 170/100. Patient c/o mild soreness of his tongue. He had bouts of thrush during his recent chemo-radiation therapy.  Patient reports his mood is a bit low, he's not sure why this is true.  Allergies  Allergies  Allergen Reactions  . Suprax (Cefixime) Rash   Medications  Current outpatient prescriptions:Ascorbic Acid (VITAMIN C PO), Take 1 capsule by mouth every other day. 8000 MG, Disp: , Rfl: ;  Bromelain 100 MG TABS, Take 150 mg by mouth 2 (two) times daily., Disp: , Rfl: ;  Calcium Carbonate 1500 MG TABS, Take 1 tablet by mouth daily. , Disp: , Rfl: ;  Cholecalciferol (VITAMIN D PO), Take 1 capsule by mouth daily. 4000 Units, Disp: , Rfl:  Coenzyme Q10 (COQ10) 100 MG CAPS, Take by mouth. Take one daily, Disp: , Rfl: ;  COLLAGEN PO, Take by mouth., Disp: , Rfl: ;  cyanocobalamin (,VITAMIN B-12,) 1000 MCG/ML injection, Inject 1,000 mcg into the muscle every 21 ( twenty-one) days. , Disp: , Rfl: ;  DIGESTIVE ENZYMES PO, Take by mouth daily. , Disp: , Rfl: ;  fish  oil-omega-3 fatty acids 1000 MG capsule, Take 2 g by mouth daily., Disp: , Rfl:  GLUCOSAMINE-CHONDROITIN-MSM PO, Take by mouth., Disp: , Rfl: ;  Hyaluronic Acid-Vitamin C (HYALURONIC ACID PO), 100mg  twice a day, Disp: , Rfl: ;  L-ARGININE PO, Take 2 g by mouth daily. , Disp: , Rfl: ;  Multiple Vitamins-Minerals (MULTIVITAMINS THER. W/MINERALS) TABS, Take 2 tablets by mouth 2 (two) times daily. , Disp: , Rfl: ;  QUERCETIN PO, Take by mouth., Disp: , Rfl: ;  RESVERATROL PO, Take 4 capsules by mouth daily. , Disp: , Rfl:  Turmeric Curcumin 500 MG CAPS, Take by mouth. Take two capsules daily, Disp: , Rfl: ;  TURMERIC CURCUMIN PO, Take 1 g by mouth daily. , Disp: , Rfl: ;  vitamin E 400 UNIT capsule, Take 400 Units by mouth daily., Disp: , Rfl: ;  lisinopril (PRINIVIL,ZESTRIL) 10 MG tablet, Take 1 tablet (10 mg total) by mouth daily., Disp: 30 tablet, Rfl: 11   Data Reviewed       ZHY:QMVHQIO, external 08/09/2011: Glucose 85, BUN 20, creatinine 1.11, sodium 141, potassium 4.0. LFTs/proteins WNL.  Total cholesterol 211, triglycerides 72, HDL 58, LDL 139  Sedimentation rate 6   TSH 3.38  B12 355  A1c 5.2    Review of Systems  DATA OBTAINED: from patient, family member(spouse) GENERAL: Feels well No fevers, fatigue, change in appetite or weight SKIN: No itch, rash or open  wounds EYES: No eye pain, dryness or itching  No change in vision EARS: No earache, tinnitus, change in hearing NOSE: No congestion, drainage or bleeding MOUTH/THROAT: No mouth or tooth pain  No difficulty chewing or swallowing RESPIRATORY: No cough, wheezing, SOB CARDIAC: No chest pain, palpitations  No edema. CHEST/BREASTS: No discomfort, discharge or lumps in breasts GI: No abdominal pain  No N/V/D or constipation  No heartburn or reflux  GU: No dysuria, frequency or urgency  No change in urine volume or character No nocturia or change in stream   MUSCULOSKELETAL: No joint pain, swelling or stiffness  No back pain  No muscle  ache, pain, weakness  Gait is steady  No recent falls.  NEUROLOGIC: No dizziness, fainting, headache, imbalance, numbness  No change in mental status.  PSYCHIATRIC: No feelings of anxiety, depression Sleeps well.  No behavior issue.    Physical Exam Filed Vitals:   06/20/12 1606  BP: 138/76  Pulse: 72  Temp: 97.3 F (36.3 C)  TempSrc: Oral  Height: 6' (1.829 m)  Weight: 158 lb (71.668 kg)   Body mass index is 21.42 kg/(m^2).  GENERAL APPEARANCE: No acute distress, appropriately groomed, normal body habitus. Alert, pleasant, conversant. HEAD: Normocephalic, atraumatic EYES: Conjunctiva/lids clear. Pupils round, reactive.  EARS: Hearing grossly normal. NOSE: No deformity or discharge. MOUTH/THROAT: Lips w/o lesions. Oral mucosa, tongue moist, w/o lesion. Oropharynx w/o redness or lesions.  NECK: Supple, full ROM. No thyroid tenderness, enlargement or nodule LYMPHATICS: No head, neck or supraclavicular adenopathy RESPIRATORY: Breathing is even, unlabored. Lung sounds are clear and full.  CARDIOVASCULAR: Heart RRR. No murmur or extra heart sounds  ARTERIAL: No carotid bruit.   EDEMA: No peripheral  edema.  GASTROINTESTINAL: Abdomen is soft, non-tender, not distended w/ normal bowel sounds. MUSCULOSKELETAL: Moves all extremities with full ROM, strength and tone. Back is without kyphosis, scoliosis or spinal process tenderness. Gait is steady NEUROLOGIC: Oriented to time, place, person. Speech clear, no tremor.  PSYCHIATRIC: Mood and affect appropriate to situation  ASSESSMENT/PLAN  Unspecified essential hypertension Persistent elevated blood pressure readings. Discussed medications with patient, he agreed to start lisinopril 10 mg daily. Will obtain BMP and lipid status. Patient will take his blood pressure at home 2-3 times a week. Followup 1 month   Follow up: 1 month  Vernadine Coombs T.Shauntia Levengood, NP-C 06/20/2012

## 2012-06-20 NOTE — Assessment & Plan Note (Addendum)
Persistent elevated blood pressure readings. Discussed medications with patient, he agreed to start lisinopril 10 mg daily. Will obtain BMP and lipid status. Patient will take his blood pressure at home 2-3 times a week. Followup 1 month

## 2012-06-21 DIAGNOSIS — E785 Hyperlipidemia, unspecified: Secondary | ICD-10-CM | POA: Diagnosis not present

## 2012-06-21 DIAGNOSIS — I10 Essential (primary) hypertension: Secondary | ICD-10-CM | POA: Diagnosis not present

## 2012-06-26 DIAGNOSIS — C770 Secondary and unspecified malignant neoplasm of lymph nodes of head, face and neck: Secondary | ICD-10-CM | POA: Diagnosis not present

## 2012-06-26 DIAGNOSIS — E039 Hypothyroidism, unspecified: Secondary | ICD-10-CM | POA: Diagnosis not present

## 2012-06-26 DIAGNOSIS — C099 Malignant neoplasm of tonsil, unspecified: Secondary | ICD-10-CM | POA: Diagnosis not present

## 2012-06-26 DIAGNOSIS — Z09 Encounter for follow-up examination after completed treatment for conditions other than malignant neoplasm: Secondary | ICD-10-CM | POA: Diagnosis not present

## 2012-06-26 DIAGNOSIS — Z85819 Personal history of malignant neoplasm of unspecified site of lip, oral cavity, and pharynx: Secondary | ICD-10-CM | POA: Diagnosis not present

## 2012-07-10 DIAGNOSIS — H532 Diplopia: Secondary | ICD-10-CM | POA: Diagnosis not present

## 2012-07-12 DIAGNOSIS — D485 Neoplasm of uncertain behavior of skin: Secondary | ICD-10-CM | POA: Diagnosis not present

## 2012-07-12 DIAGNOSIS — L905 Scar conditions and fibrosis of skin: Secondary | ICD-10-CM | POA: Diagnosis not present

## 2012-07-17 ENCOUNTER — Encounter: Payer: Self-pay | Admitting: *Deleted

## 2012-07-17 HISTORY — PX: SKIN LESION EXCISION: SHX2412

## 2012-07-18 ENCOUNTER — Encounter: Payer: Medicare Other | Admitting: Geriatric Medicine

## 2012-07-23 ENCOUNTER — Encounter: Payer: Self-pay | Admitting: Internal Medicine

## 2012-07-23 ENCOUNTER — Non-Acute Institutional Stay: Payer: Medicare Other | Admitting: Internal Medicine

## 2012-07-23 VITALS — BP 122/76 | HR 68 | Ht 72.0 in | Wt 157.0 lb

## 2012-07-23 DIAGNOSIS — C099 Malignant neoplasm of tonsil, unspecified: Secondary | ICD-10-CM

## 2012-07-23 DIAGNOSIS — I10 Essential (primary) hypertension: Secondary | ICD-10-CM | POA: Diagnosis not present

## 2012-07-23 DIAGNOSIS — Z8546 Personal history of malignant neoplasm of prostate: Secondary | ICD-10-CM

## 2012-07-23 DIAGNOSIS — M79609 Pain in unspecified limb: Secondary | ICD-10-CM

## 2012-07-23 DIAGNOSIS — E039 Hypothyroidism, unspecified: Secondary | ICD-10-CM

## 2012-07-23 NOTE — Patient Instructions (Signed)
Return for lab to check thyroid. Continue all current medications.  We will consider reducing or stopping the lisinopril at your next visit. You should continue to take it until then.

## 2012-07-23 NOTE — Progress Notes (Signed)
Subjective:    Patient ID: Philip [name], male    DOB: 1932/11/30, 79 y.o.   MRN: [id number]  HPI  Unspecified hypothyroidism:  Hypothyroidism: Dr. Peyton Najjar found high TSH of 175. Started 75 mcg levothyroxine about 06/26/2012.   Patient has a history of tonsillar cancer. As part of his treatment he received radiation therapy to the neck. He had a normal TSH in July 2013.  History of prostate cancer:  Patient had outpatient urethral dilation by Dr. Alfredo Martinez in May 2014  HTN (hypertension): Controlled. Improved since being put on lisinopril. He asked if he needs to stay with this medication.  Tonsillar cancer: Status post surgery and radiation therapy. Current hypothyroidism is likely a result of the radiation treatment. He has no current active areas.  Pain in limb: Complains of pain in the right shoulder area following playing ping-pong. It is getting better. Occurred about 1 month ago. He continues to use MSM with glucosamine and chondroitin sulfate.   Review of Systems  Constitutional: Positive for activity change, appetite change and fatigue.  HENT: Negative for ear pain, sore throat, drooling, trouble swallowing, neck pain and voice change.   Eyes: Negative.   Respiratory: Negative for cough, choking and chest tightness.        History of sleep apnea.  Cardiovascular: Negative for chest pain, palpitations and leg swelling.  Gastrointestinal: Negative for nausea, abdominal pain, diarrhea and abdominal distention.  Genitourinary: Negative for urgency, difficulty urinating, penile pain and testicular pain.  Musculoskeletal: Positive for myalgias and arthralgias. Negative for back pain, joint swelling and gait problem.  Skin:       History of actinic keratoses and skin cancers.  Neurological: Positive for weakness. Negative for dizziness, tremors, seizures, syncope, facial asymmetry, speech difficulty, light-headedness and headaches.  Hematological: Negative.    Psychiatric/Behavioral: Negative.        Insomnia.       Objective:   Physical Exam  Constitutional: He is oriented to person, place, and time. He appears well-developed and well-nourished. No distress.  HENT:  Right Ear: External ear normal.  Left Ear: External ear normal.  Nose: Nose normal.  Mouth/Throat: No oropharyngeal exudate.  Eyes: Conjunctivae and EOM are normal. Pupils are equal, round, and reactive to light. Right eye exhibits discharge. Left eye exhibits no discharge.  Neck: Normal range of motion. Neck supple. No JVD present. No tracheal deviation present. No thyromegaly present.  Cardiovascular: Normal rate, regular rhythm, normal heart sounds and intact distal pulses.  Exam reveals no gallop and no friction rub.   No murmur heard. Pulmonary/Chest: Effort normal and breath sounds normal. No respiratory distress. He has no wheezes. He has no rales. He exhibits no tenderness.  Abdominal: Soft. Bowel sounds are normal. He exhibits no distension and no mass. There is no tenderness.  Musculoskeletal: Normal range of motion. He exhibits no edema and no tenderness.  Moderate discomfort with palpation left greater trochanter.  Lymphadenopathy:    He has no cervical adenopathy.  Neurological: He is alert and oriented to person, place, and time. He has normal reflexes. No cranial nerve deficit. Coordination normal.  Skin: No rash noted. No erythema. No pallor.  Multiple actinic keratoses.  Psychiatric: He has a normal mood and affect. His behavior is normal. Judgment and thought content normal.          Assessment & Plan:  Unspecified hypothyroidism -   Plan: TSH in 2 weeks and again in 8 weeks.  Results should be sent to Dr.  Brizelle  HTN (hypertension)  Controlled. Continue lisinopril. Advised patient that we will discuss whether he needs to stay on this medication at his next appointment in approximately 3 months.  History of prostate cancer  No evidence for  relapse  Tonsillar cancer  No evidence for relapse  Pain in limb  Chronic problems and shoulders, hips, and legs. Continue his current regimen of in MSM with glucosamine and chondroitin sulfate. Other OTC pain relievers may be used as well.

## 2012-08-14 DIAGNOSIS — H532 Diplopia: Secondary | ICD-10-CM | POA: Diagnosis not present

## 2012-08-16 DIAGNOSIS — E039 Hypothyroidism, unspecified: Secondary | ICD-10-CM | POA: Diagnosis not present

## 2012-08-21 DIAGNOSIS — H532 Diplopia: Secondary | ICD-10-CM | POA: Diagnosis not present

## 2012-08-23 DIAGNOSIS — Z8546 Personal history of malignant neoplasm of prostate: Secondary | ICD-10-CM | POA: Diagnosis not present

## 2012-08-23 DIAGNOSIS — N400 Enlarged prostate without lower urinary tract symptoms: Secondary | ICD-10-CM | POA: Diagnosis not present

## 2012-08-23 DIAGNOSIS — E538 Deficiency of other specified B group vitamins: Secondary | ICD-10-CM | POA: Diagnosis not present

## 2012-08-27 ENCOUNTER — Non-Acute Institutional Stay: Payer: Medicare Other | Admitting: Internal Medicine

## 2012-08-27 ENCOUNTER — Encounter: Payer: Self-pay | Admitting: Internal Medicine

## 2012-08-27 VITALS — BP 100/64 | HR 68 | Ht 72.0 in | Wt 156.0 lb

## 2012-08-27 DIAGNOSIS — C099 Malignant neoplasm of tonsil, unspecified: Secondary | ICD-10-CM | POA: Diagnosis not present

## 2012-08-27 DIAGNOSIS — E538 Deficiency of other specified B group vitamins: Secondary | ICD-10-CM

## 2012-08-27 DIAGNOSIS — Z8546 Personal history of malignant neoplasm of prostate: Secondary | ICD-10-CM | POA: Diagnosis not present

## 2012-08-27 DIAGNOSIS — I10 Essential (primary) hypertension: Secondary | ICD-10-CM | POA: Diagnosis not present

## 2012-08-27 DIAGNOSIS — E039 Hypothyroidism, unspecified: Secondary | ICD-10-CM

## 2012-08-27 DIAGNOSIS — L570 Actinic keratosis: Secondary | ICD-10-CM

## 2012-08-27 NOTE — Progress Notes (Signed)
Subjective:    Patient ID: Philip [name], male    DOB: [birth year], 79 y.o.   MRN: [id number]  HPI Unspecified essential hypertension: controlled  Unspecified hypothyroidism  Hypothyroidism: Dr. Peyton Najjar found high TSH of 175. Started 75 mcg levothyroxine about 06/26/2012.  Patient has a history of tonsillar cancer. As part of his treatment he received radiation therapy to the neck. He had a normal TSH in July 2013 Now on levothyroxine 75 mcg qd.  History of prostate cancer: normal PSA.  HTN (hypertension): CONTROLLED  Tonsillar cancer: stable  B12: Takes the injection every 3 weeks. He has accidentally missed doses in the past. He thinks this affects his memory and energy level. Most recent value was normal.    Current Outpatient Prescriptions on File Prior to Visit  Medication Sig Dispense Refill  . Ascorbic Acid (VITAMIN C PO) Take 1 capsule by mouth every other day. 8000 MG      . Bromelain 100 MG TABS Take 150 mg by mouth 2 (two) times daily. Take 1 1/2 tablet twice daily.      . Calcium Carbonate 1500 MG TABS Take 1 tablet by mouth daily.       . Cholecalciferol (VITAMIN D PO) Take 1 capsule by mouth daily. Take 1 tablet daily, 4000 Units      . Coenzyme Q10 (COQ10) 100 MG CAPS Take 100 mg by mouth daily. Take one daily      . COLLAGEN PO Take 600 mg by mouth 2 (two) times daily. Take 600 mg twice daily.      . cyanocobalamin (,VITAMIN B-12,) 1000 MCG/ML injection Inject 1,000 mcg into the muscle every 21 ( twenty-one) days.       Marland Kitchen DIGESTIVE ENZYMES PO Take 500 mg by mouth daily. Take 500 mg twice daily      . fish oil-omega-3 fatty acids 1000 MG capsule Take 2 g by mouth daily.      Marland Kitchen GLUCOSAMINE-CHONDROITIN-MSM PO Take 500 mg by mouth 2 (two) times daily. Take 500 mg twice daily.      Marland Kitchen Hyaluronic Acid-Vitamin C (HYALURONIC ACID PO) Take 100 mg by mouth 2 (two) times daily. 100mg  twice a day      . L-ARGININE PO Take 1,000 mg by mouth daily. Take a 1500 mg tablet twice  daily equals 1 tablet and a half.      . levothyroxine (SYNTHROID, LEVOTHROID) 75 MCG tablet Take 75 mcg by mouth daily before breakfast.      . lisinopril (PRINIVIL,ZESTRIL) 10 MG tablet Take 1 tablet (10 mg total) by mouth daily.  30 tablet  11  . Multiple Vitamins-Minerals (MULTIVITAMINS THER. W/MINERALS) TABS Take 2 tablets by mouth 2 (two) times daily.       Marland Kitchen QUERCETIN PO Take 250 mg by mouth daily. Take 500 mg daily      . RESVERATROL PO Take 100 mg by mouth 2 (two) times daily. Take 2 100 mg tablets twice daily.      . Turmeric Curcumin 500 MG CAPS Take by mouth. Take two capsules daily      . vitamin E 400 UNIT capsule Take 400 Units by mouth daily. Take 1 tablet daily for vitamin e supplement.       No current facility-administered medications on file prior to visit.    Review of Systems  Constitutional: Positive for activity change, appetite change and fatigue.  HENT: Negative for ear pain, sore throat, drooling, trouble swallowing, neck pain and voice change.  Eyes: Negative.   Respiratory: Negative for cough, choking and chest tightness.        History of sleep apnea.  Cardiovascular: Negative for chest pain, palpitations and leg swelling.  Gastrointestinal: Negative for nausea, abdominal pain, diarrhea and abdominal distention.  Genitourinary: Negative for urgency, difficulty urinating, penile pain and testicular pain.  Musculoskeletal: Positive for myalgias and arthralgias. Negative for back pain, joint swelling and gait problem.  Skin:       History of actinic keratoses and skin cancers.  Neurological: Positive for weakness. Negative for dizziness, tremors, seizures, syncope, facial asymmetry, speech difficulty, light-headedness and headaches.  Hematological: Negative.   Psychiatric/Behavioral: Negative.        Insomnia.       Objective:BP 100/64  Pulse 68  Ht 6' (1.829 m)  Wt 156 lb (70.761 kg)  BMI 21.15 kg/m2    Physical Exam  Constitutional: He is oriented  to person, place, and time. He appears well-developed and well-nourished. No distress.  HENT:  Right Ear: External ear normal.  Left Ear: External ear normal.  Nose: Nose normal.  Mouth/Throat: No oropharyngeal exudate.  Eyes: Conjunctivae and EOM are normal. Pupils are equal, round, and reactive to light. Right eye exhibits discharge. Left eye exhibits no discharge.  Neck: Normal range of motion. Neck supple. No JVD present. No tracheal deviation present. No thyromegaly present.  Cardiovascular: Normal rate, regular rhythm, normal heart sounds and intact distal pulses.  Exam reveals no gallop and no friction rub.   No murmur heard. Pulmonary/Chest: Effort normal and breath sounds normal. No respiratory distress. He has no wheezes. He has no rales. He exhibits no tenderness.  Abdominal: Soft. Bowel sounds are normal. He exhibits no distension and no mass. There is no tenderness.  Musculoskeletal: Normal range of motion. He exhibits no edema and no tenderness.  Moderate discomfort with palpation left greater trochanter.  Lymphadenopathy:    He has no cervical adenopathy.  Neurological: He is alert and oriented to person, place, and time. He has normal reflexes. No cranial nerve deficit. Coordination normal.  Skin: No rash noted. No erythema. No pallor.  Multiple actinic keratoses.  Psychiatric: He has a normal mood and affect. His behavior is normal. Judgment and thought content normal.     08/16/12 TSH 2.769  B12: 652  PSA 0.02     Assessment & Plan:  Unspecified essential hypertension: controlled  Unspecified hypothyroidism: back to normal  History of prostate cancer: no evidence of relapse  HTN (hypertension): controlled  Tonsillar cancer; stable.  B12 deficiency: continue monthly injections

## 2012-08-27 NOTE — Patient Instructions (Signed)
Continue current medications. 

## 2012-08-28 DIAGNOSIS — C770 Secondary and unspecified malignant neoplasm of lymph nodes of head, face and neck: Secondary | ICD-10-CM | POA: Diagnosis not present

## 2012-08-28 DIAGNOSIS — C099 Malignant neoplasm of tonsil, unspecified: Secondary | ICD-10-CM | POA: Diagnosis not present

## 2012-09-04 ENCOUNTER — Encounter: Payer: Self-pay | Admitting: Internal Medicine

## 2012-09-21 DIAGNOSIS — N390 Urinary tract infection, site not specified: Secondary | ICD-10-CM | POA: Diagnosis not present

## 2012-10-09 DIAGNOSIS — R5381 Other malaise: Secondary | ICD-10-CM | POA: Diagnosis not present

## 2012-10-11 DIAGNOSIS — L570 Actinic keratosis: Secondary | ICD-10-CM | POA: Diagnosis not present

## 2012-10-15 DIAGNOSIS — L259 Unspecified contact dermatitis, unspecified cause: Secondary | ICD-10-CM | POA: Diagnosis not present

## 2012-10-22 ENCOUNTER — Non-Acute Institutional Stay: Payer: Medicare Other | Admitting: Internal Medicine

## 2012-10-22 ENCOUNTER — Encounter: Payer: Self-pay | Admitting: Internal Medicine

## 2012-10-22 VITALS — BP 110/70 | HR 76 | Ht 72.0 in | Wt 154.0 lb

## 2012-10-22 DIAGNOSIS — I10 Essential (primary) hypertension: Secondary | ICD-10-CM | POA: Diagnosis not present

## 2012-10-22 DIAGNOSIS — R5381 Other malaise: Secondary | ICD-10-CM

## 2012-10-22 DIAGNOSIS — C099 Malignant neoplasm of tonsil, unspecified: Secondary | ICD-10-CM | POA: Diagnosis not present

## 2012-10-22 DIAGNOSIS — E039 Hypothyroidism, unspecified: Secondary | ICD-10-CM

## 2012-10-22 DIAGNOSIS — M79609 Pain in unspecified limb: Secondary | ICD-10-CM

## 2012-10-22 DIAGNOSIS — R5383 Other fatigue: Secondary | ICD-10-CM | POA: Insufficient documentation

## 2012-10-22 NOTE — Patient Instructions (Addendum)
Continue current medications.  Consider adding St. John's wort.

## 2012-10-22 NOTE — Progress Notes (Signed)
Subjective:    Patient ID: Philip [name], male    DOB: March 31, 1932, 79 y.o.   MRN: [id number]  HPI Tonsillar cancer  HTN (hypertension)  Unspecified hypothyroidism: corrected. Still feeling fatigued. Feels like he could lie in bed all day. Once he gets moving, he starts to feel better.  Sleep is interrupted. Can fall asleep, but has early morning awakening. Using glycine and and GABA, which seems to help.  He may be depressed since his cancer treatment. Left him impotent. Wastreated about 5 years ago for depression with citalopram and bupropion.  Pain in limb: left greater trochanter bursitis has cleared.    Current Outpatient Prescriptions on File Prior to Visit  Medication Sig Dispense Refill  . Ascorbic Acid (VITAMIN C PO) Take 1 capsule by mouth every other day. 8000 MG      . Bromelain 100 MG TABS Take 150 mg by mouth 2 (two) times daily. Take 1 1/2 tablet twice daily.      . Calcium Carbonate 1500 MG TABS Take 1 tablet by mouth daily.       . Cholecalciferol (VITAMIN D PO) Take 1 capsule by mouth daily. Take 1 tablet daily, 4000 Units      . Coenzyme Q10 (COQ10) 100 MG CAPS Take 100 mg by mouth daily. Take one daily      . COLLAGEN PO Take 600 mg by mouth 2 (two) times daily. Take 600 mg twice daily.      . cyanocobalamin (,VITAMIN B-12,) 1000 MCG/ML injection Inject 1,000 mcg into the muscle every 21 ( twenty-one) days.       Marland Kitchen DIGESTIVE ENZYMES PO Take 500 mg by mouth daily. Take 500 mg twice daily      . fish oil-omega-3 fatty acids 1000 MG capsule Take 2 g by mouth daily.      Marland Kitchen GLUCOSAMINE-CHONDROITIN-MSM PO Take 500 mg by mouth 2 (two) times daily. Take 500 mg twice daily.      Marland Kitchen Hyaluronic Acid-Vitamin C (HYALURONIC ACID PO) Take 100 mg by mouth 2 (two) times daily. 100mg  twice a day      . levothyroxine (SYNTHROID, LEVOTHROID) 75 MCG tablet Take 75 mcg by mouth daily before breakfast.      . lisinopril (PRINIVIL,ZESTRIL) 10 MG tablet Take 1 tablet (10 mg total) by  mouth daily.  30 tablet  11  . Multiple Vitamins-Minerals (MULTIVITAMINS THER. W/MINERALS) TABS Take 2 tablets by mouth 2 (two) times daily.       Marland Kitchen QUERCETIN PO Take 250 mg by mouth daily. Take 500 mg daily      . RESVERATROL PO Take 100 mg by mouth 2 (two) times daily. Take 2 100 mg tablets twice daily.      . Turmeric Curcumin 500 MG CAPS Take by mouth. Take two capsules daily      . vitamin E 400 UNIT capsule Take 400 Units by mouth daily. Take 1 tablet daily for vitamin e supplement.       No current facility-administered medications on file prior to visit.    Review of Systems  Constitutional: Positive for activity change, appetite change and fatigue.  HENT: Negative for ear pain, sore throat, drooling, trouble swallowing, neck pain and voice change.   Eyes: Negative.   Respiratory: Negative for cough, choking and chest tightness.        History of sleep apnea.  Cardiovascular: Negative for chest pain, palpitations and leg swelling.  Gastrointestinal: Negative for nausea, abdominal pain, diarrhea and abdominal distention.  Dry mouth and difficulty swallowing.  Genitourinary: Negative for urgency, difficulty urinating, penile pain and testicular pain.  Musculoskeletal: Positive for myalgias and arthralgias. Negative for back pain, joint swelling and gait problem.  Skin:       History of actinic keratoses and skin cancers.  Neurological: Positive for weakness. Negative for dizziness, tremors, seizures, syncope, facial asymmetry, speech difficulty, light-headedness and headaches.  Hematological: Negative.   Psychiatric/Behavioral: Negative.        Insomnia.       Objective:BP 110/70  Pulse 76  Ht 6' (1.829 m)  Wt 154 lb (69.854 kg)  BMI 20.88 kg/m2    Physical Exam  Constitutional: He is oriented to person, place, and time. He appears well-developed and well-nourished. No distress.  HENT:  Right Ear: External ear normal.  Left Ear: External ear normal.  Nose: Nose  normal.  Mouth/Throat: No oropharyngeal exudate.  Eyes: Conjunctivae and EOM are normal. Pupils are equal, round, and reactive to light. Right eye exhibits discharge. Left eye exhibits no discharge.  Neck: Normal range of motion. Neck supple. No JVD present. No tracheal deviation present. No thyromegaly present.  Cardiovascular: Normal rate, regular rhythm, normal heart sounds and intact distal pulses.  Exam reveals no gallop and no friction rub.   No murmur heard. Pulmonary/Chest: Effort normal and breath sounds normal. No respiratory distress. He has no wheezes. He has no rales. He exhibits no tenderness.  Abdominal: Soft. Bowel sounds are normal. He exhibits no distension and no mass. There is no tenderness.  Musculoskeletal: Normal range of motion. He exhibits no edema and no tenderness.  Moderate discomfort with palpation left greater trochanter.  Lymphadenopathy:    He has no cervical adenopathy.  Neurological: He is alert and oriented to person, place, and time. He has normal reflexes. No cranial nerve deficit. Coordination normal.  Skin: No rash noted. No erythema. No pallor.  Multiple actinic keratoses.  Psychiatric: He has a normal mood and affect. His behavior is normal. Judgment and thought content normal.    LAB REVIEWED 10/09/12 TSH 2.757      Assessment & Plan:  Tonsillar cancer: stable   HTN (hypertension): improved  Unspecified hypothyroidism: improved  Pain in limb: improved  Other malaise and fatigue: no improvement with correction of hypothyroidism. Other possibilities include depression. He worries his adrenal gland may be insufficient.

## 2012-10-23 DIAGNOSIS — C099 Malignant neoplasm of tonsil, unspecified: Secondary | ICD-10-CM | POA: Diagnosis not present

## 2012-10-23 DIAGNOSIS — C770 Secondary and unspecified malignant neoplasm of lymph nodes of head, face and neck: Secondary | ICD-10-CM | POA: Diagnosis not present

## 2012-12-05 DIAGNOSIS — D1801 Hemangioma of skin and subcutaneous tissue: Secondary | ICD-10-CM | POA: Diagnosis not present

## 2012-12-05 DIAGNOSIS — L570 Actinic keratosis: Secondary | ICD-10-CM | POA: Diagnosis not present

## 2012-12-05 DIAGNOSIS — L905 Scar conditions and fibrosis of skin: Secondary | ICD-10-CM | POA: Diagnosis not present

## 2012-12-05 DIAGNOSIS — Z85828 Personal history of other malignant neoplasm of skin: Secondary | ICD-10-CM | POA: Diagnosis not present

## 2012-12-05 DIAGNOSIS — L819 Disorder of pigmentation, unspecified: Secondary | ICD-10-CM | POA: Diagnosis not present

## 2012-12-05 DIAGNOSIS — L821 Other seborrheic keratosis: Secondary | ICD-10-CM | POA: Diagnosis not present

## 2012-12-18 DIAGNOSIS — I10 Essential (primary) hypertension: Secondary | ICD-10-CM | POA: Diagnosis not present

## 2012-12-18 DIAGNOSIS — E039 Hypothyroidism, unspecified: Secondary | ICD-10-CM | POA: Diagnosis not present

## 2012-12-18 DIAGNOSIS — R5381 Other malaise: Secondary | ICD-10-CM | POA: Diagnosis not present

## 2012-12-18 LAB — BASIC METABOLIC PANEL
Creatinine: 1 mg/dL (ref 0.6–1.3)
Potassium: 4.1 mmol/L (ref 3.4–5.3)

## 2012-12-18 LAB — HEPATIC FUNCTION PANEL
ALT: 12 U/L (ref 10–40)
Alkaline Phosphatase: 55 U/L (ref 25–125)

## 2012-12-24 ENCOUNTER — Encounter: Payer: Self-pay | Admitting: Internal Medicine

## 2012-12-24 ENCOUNTER — Non-Acute Institutional Stay: Payer: Medicare Other | Admitting: Internal Medicine

## 2012-12-24 VITALS — BP 130/68 | HR 72 | Ht 71.0 in | Wt 154.0 lb

## 2012-12-24 DIAGNOSIS — E039 Hypothyroidism, unspecified: Secondary | ICD-10-CM

## 2012-12-24 DIAGNOSIS — C099 Malignant neoplasm of tonsil, unspecified: Secondary | ICD-10-CM

## 2012-12-24 DIAGNOSIS — N539 Unspecified male sexual dysfunction: Secondary | ICD-10-CM

## 2012-12-24 DIAGNOSIS — I10 Essential (primary) hypertension: Secondary | ICD-10-CM | POA: Diagnosis not present

## 2012-12-24 DIAGNOSIS — F529 Unspecified sexual dysfunction not due to a substance or known physiological condition: Secondary | ICD-10-CM

## 2012-12-24 DIAGNOSIS — M79609 Pain in unspecified limb: Secondary | ICD-10-CM | POA: Diagnosis not present

## 2012-12-24 DIAGNOSIS — R5381 Other malaise: Secondary | ICD-10-CM

## 2012-12-24 NOTE — Progress Notes (Signed)
Subjective:    Patient ID: Philip [name], male    DOB: May 24, 1932, 79 y.o.   MRN: [id number]   Chief Complaint  Patient presents with  . Medical Managment of Chronic Issues    blood pressure, thyroid    HPI HTN (hypertension): controlled  Tonsillar cancer: new nontender node in the right neck. Present about 2 weeks per patient.  Unspecified hypothyroidism: stable  Pain in limb: improved  Other malaise and fatigue:persists.  Weight has fallen again, but he says his appetite is Ok.    Current Outpatient Prescriptions on File Prior to Visit  Medication Sig Dispense Refill  . Ascorbic Acid (VITAMIN C PO) Take 1 capsule by mouth every other day. 8000 MG      . Bromelain 100 MG TABS Take 150 mg by mouth 2 (two) times daily. Take 1 1/2 tablet twice daily.      . Calcium Carbonate 1500 MG TABS Take 1 tablet by mouth daily.       . Cholecalciferol (VITAMIN D PO) Take 1 capsule by mouth daily. Take 1 tablet daily, 4000 Units      . Coenzyme Q10 (COQ10) 100 MG CAPS Take 100 mg by mouth daily. Take one daily      . COLLAGEN PO Take 600 mg by mouth 2 (two) times daily. Take 600 mg twice daily.      . cyanocobalamin (,VITAMIN B-12,) 1000 MCG/ML injection Inject 1,000 mcg into the muscle every 21 ( twenty-one) days.       Marland Kitchen DIGESTIVE ENZYMES PO Take 500 mg by mouth daily. Take 500 mg twice daily      . fish oil-omega-3 fatty acids 1000 MG capsule Take 2 g by mouth daily.      Marland Kitchen GLUCOSAMINE-CHONDROITIN-MSM PO Take 500 mg by mouth 2 (two) times daily. Take 500 mg twice daily.      Marland Kitchen Hyaluronic Acid-Vitamin C (HYALURONIC ACID PO) Take 100 mg by mouth 2 (two) times daily. 100mg  twice a day      . levothyroxine (SYNTHROID, LEVOTHROID) 75 MCG tablet Take 75 mcg by mouth daily before breakfast.      . lisinopril (PRINIVIL,ZESTRIL) 10 MG tablet Take 1 tablet (10 mg total) by mouth daily.  30 tablet  11  . Multiple Vitamins-Minerals (MULTIVITAMINS THER. W/MINERALS) TABS Take 2 tablets by mouth 2  (two) times daily.       Marland Kitchen QUERCETIN PO Take 250 mg by mouth daily. Take 500 mg daily      . RESVERATROL PO Take 100 mg by mouth 2 (two) times daily. Take 2 100 mg tablets twice daily.      . Turmeric Curcumin 500 MG CAPS Take by mouth. Take two capsules daily      . vitamin E 400 UNIT capsule Take 400 Units by mouth daily. Take 1 tablet daily for vitamin e supplement.       No current facility-administered medications on file prior to visit.    Review of Systems  Constitutional: Positive for activity change, appetite change and fatigue.  HENT: Negative for drooling, ear pain, sore throat, trouble swallowing and voice change.   Eyes: Negative.   Respiratory: Negative for cough, choking and chest tightness.        History of sleep apnea.  Cardiovascular: Negative for chest pain, palpitations and leg swelling.  Gastrointestinal: Negative for nausea, abdominal pain, diarrhea and abdominal distention.       Dry mouth and difficulty swallowing.  Genitourinary: Negative for urgency, difficulty  urinating, penile pain and testicular pain.  Musculoskeletal: Positive for arthralgias and myalgias. Negative for back pain, gait problem, joint swelling and neck pain.  Skin:       History of actinic keratoses and skin cancers.  Neurological: Positive for weakness. Negative for dizziness, tremors, seizures, syncope, facial asymmetry, speech difficulty, light-headedness and headaches.  Hematological: Negative.   Psychiatric/Behavioral: Positive for sleep disturbance and dysphoric mood. Negative for suicidal ideas, hallucinations, behavioral problems, confusion, self-injury, decreased concentration and agitation. The patient is nervous/anxious. The patient is not hyperactive.        Insomnia. Patient and wife are involved in sex therapy at [facility] with Dr. Isaias Sakai.       Objective:BP 130/68  Pulse 72  Ht 5\' 11"  (1.803 m)  Wt 154 lb (69.854 kg)  BMI 21.49 kg/m2    Physical Exam  Constitutional:  He is oriented to person, place, and time. He appears well-developed and well-nourished. No distress.  HENT:  Right Ear: External ear normal.  Left Ear: External ear normal.  Nose: Nose normal.  Mouth/Throat: No oropharyngeal exudate.  Eyes: Conjunctivae and EOM are normal. Pupils are equal, round, and reactive to light. Right eye exhibits discharge. Left eye exhibits no discharge.  Neck: Normal range of motion. Neck supple. No JVD present. No tracheal deviation present. No thyromegaly present.  New nodule right neck. Nontender present since late November 2014.  Cardiovascular: Normal rate, regular rhythm, normal heart sounds and intact distal pulses.  Exam reveals no gallop and no friction rub.   No murmur heard. Pulmonary/Chest: Effort normal and breath sounds normal. No respiratory distress. He has no wheezes. He has no rales. He exhibits no tenderness.  Abdominal: Soft. Bowel sounds are normal. He exhibits no distension and no mass. There is no tenderness.  Musculoskeletal: Normal range of motion. He exhibits no edema and no tenderness.  Moderate discomfort with palpation left greater trochanter.  Lymphadenopathy:    He has no cervical adenopathy.  Neurological: He is alert and oriented to person, place, and time. He has normal reflexes. No cranial nerve deficit. Coordination normal.  Skin: No rash noted. No erythema. No pallor.  Multiple actinic keratoses.  Psychiatric: He has a normal mood and affect. His behavior is normal. Judgment and thought content normal.    LAB REVIEWED  10/09/12 TSH 2.757 12/18/12 CMP: normal  TSH 3.241  Cortisol 17.0 (normal)      Assessment & Plan:  HTN (hypertension): Controlled  Tonsillar cancer: New nodule in the right neck. Patient is to see Dr. Maudie Flakes soon  Unspecified hypothyroidism: Compensated  Pain in limb: Improved  Other malaise and fatigue: Vague complaints  Male sexual dysfunction: Engaged in sex therapy

## 2012-12-26 DIAGNOSIS — Z961 Presence of intraocular lens: Secondary | ICD-10-CM | POA: Diagnosis not present

## 2012-12-26 DIAGNOSIS — H532 Diplopia: Secondary | ICD-10-CM | POA: Diagnosis not present

## 2012-12-26 DIAGNOSIS — H35369 Drusen (degenerative) of macula, unspecified eye: Secondary | ICD-10-CM | POA: Diagnosis not present

## 2012-12-26 DIAGNOSIS — H3530 Unspecified macular degeneration: Secondary | ICD-10-CM | POA: Diagnosis not present

## 2013-01-02 DIAGNOSIS — C099 Malignant neoplasm of tonsil, unspecified: Secondary | ICD-10-CM | POA: Diagnosis not present

## 2013-01-02 DIAGNOSIS — C770 Secondary and unspecified malignant neoplasm of lymph nodes of head, face and neck: Secondary | ICD-10-CM | POA: Diagnosis not present

## 2013-01-02 DIAGNOSIS — Z85819 Personal history of malignant neoplasm of unspecified site of lip, oral cavity, and pharynx: Secondary | ICD-10-CM | POA: Diagnosis not present

## 2013-01-02 DIAGNOSIS — R220 Localized swelling, mass and lump, head: Secondary | ICD-10-CM | POA: Diagnosis not present

## 2013-01-02 DIAGNOSIS — K1120 Sialoadenitis, unspecified: Secondary | ICD-10-CM | POA: Diagnosis not present

## 2013-01-02 DIAGNOSIS — Z09 Encounter for follow-up examination after completed treatment for conditions other than malignant neoplasm: Secondary | ICD-10-CM | POA: Diagnosis not present

## 2013-01-04 DIAGNOSIS — N529 Male erectile dysfunction, unspecified: Secondary | ICD-10-CM | POA: Diagnosis not present

## 2013-01-20 DIAGNOSIS — N539 Unspecified male sexual dysfunction: Secondary | ICD-10-CM | POA: Insufficient documentation

## 2013-01-20 NOTE — Patient Instructions (Signed)
See Dr. Manuella Ghazi as planned.

## 2013-01-24 ENCOUNTER — Other Ambulatory Visit: Payer: Self-pay

## 2013-01-24 MED ORDER — CYANOCOBALAMIN 1000 MCG/ML IJ SOLN: 1000.0000 ug | INTRAMUSCULAR | Status: DC

## 2013-01-25 DIAGNOSIS — N35919 Unspecified urethral stricture, male, unspecified site: Secondary | ICD-10-CM | POA: Diagnosis not present

## 2013-01-25 DIAGNOSIS — N529 Male erectile dysfunction, unspecified: Secondary | ICD-10-CM | POA: Diagnosis not present

## 2013-02-05 DIAGNOSIS — C099 Malignant neoplasm of tonsil, unspecified: Secondary | ICD-10-CM | POA: Diagnosis not present

## 2013-02-05 DIAGNOSIS — K1120 Sialoadenitis, unspecified: Secondary | ICD-10-CM | POA: Diagnosis not present

## 2013-02-05 DIAGNOSIS — K117 Disturbances of salivary secretion: Secondary | ICD-10-CM | POA: Diagnosis not present

## 2013-02-05 DIAGNOSIS — C770 Secondary and unspecified malignant neoplasm of lymph nodes of head, face and neck: Secondary | ICD-10-CM | POA: Diagnosis not present

## 2013-02-05 DIAGNOSIS — D499 Neoplasm of unspecified behavior of unspecified site: Secondary | ICD-10-CM | POA: Diagnosis not present

## 2013-02-05 DIAGNOSIS — Z923 Personal history of irradiation: Secondary | ICD-10-CM | POA: Diagnosis not present

## 2013-02-07 DIAGNOSIS — E039 Hypothyroidism, unspecified: Secondary | ICD-10-CM | POA: Diagnosis not present

## 2013-02-07 LAB — TSH: TSH: 4.51 u[IU]/mL (ref 0.41–5.90)

## 2013-02-13 ENCOUNTER — Telehealth: Payer: Self-pay

## 2013-02-13 NOTE — Telephone Encounter (Signed)
Called patient to let him know that the TSH done 02/07/13 was 4.515, continue same dose of Synthroid per Claudette. Next appt 04/01/13 with Dr. Nyoka Cowden

## 2013-02-25 ENCOUNTER — Telehealth: Payer: Self-pay | Admitting: *Deleted

## 2013-02-25 NOTE — Telephone Encounter (Signed)
Patient called and Left Message on Voicemail and wanted to know if you will prescribe him Wellbutrin. I tried calling patient and Left Message on Voicemail to return call.

## 2013-02-28 NOTE — Telephone Encounter (Signed)
Patient called wanting Wellbutrin called into pharmacy. His therapist suggested it and patient stated that she was going to call you.  Pharmacy: [facility]

## 2013-02-28 NOTE — Telephone Encounter (Signed)
Rx; Wellbutrin 100mg  (#60) One twice daily to help nerves. Refill x 5

## 2013-03-01 ENCOUNTER — Other Ambulatory Visit: Payer: Self-pay | Admitting: *Deleted

## 2013-03-01 MED ORDER — BUPROPION HCL 100 MG PO TABS: ORAL_TABLET | ORAL | Status: DC

## 2013-03-01 NOTE — Telephone Encounter (Signed)
Patient Notified and Rx Faxed into Pharmacy

## 2013-03-28 DIAGNOSIS — E039 Hypothyroidism, unspecified: Secondary | ICD-10-CM | POA: Diagnosis not present

## 2013-03-28 LAB — TSH: TSH: 7.94 u[IU]/mL — AB (ref 0.41–5.90)

## 2013-04-01 ENCOUNTER — Non-Acute Institutional Stay: Payer: Medicare Other | Admitting: Internal Medicine

## 2013-04-01 ENCOUNTER — Encounter: Payer: Self-pay | Admitting: Internal Medicine

## 2013-04-01 VITALS — BP 116/70 | HR 48 | Ht 71.0 in | Wt 160.0 lb

## 2013-04-01 DIAGNOSIS — F329 Major depressive disorder, single episode, unspecified: Secondary | ICD-10-CM | POA: Diagnosis not present

## 2013-04-01 DIAGNOSIS — I10 Essential (primary) hypertension: Secondary | ICD-10-CM

## 2013-04-01 DIAGNOSIS — F3289 Other specified depressive episodes: Secondary | ICD-10-CM | POA: Diagnosis not present

## 2013-04-01 DIAGNOSIS — C099 Malignant neoplasm of tonsil, unspecified: Secondary | ICD-10-CM

## 2013-04-01 DIAGNOSIS — E039 Hypothyroidism, unspecified: Secondary | ICD-10-CM | POA: Diagnosis not present

## 2013-04-01 DIAGNOSIS — F32A Depression, unspecified: Secondary | ICD-10-CM

## 2013-04-01 MED ORDER — LEVOTHYROXINE SODIUM 100 MCG PO TABS: ORAL_TABLET | ORAL | Status: DC

## 2013-04-01 NOTE — Progress Notes (Signed)
Patient ID: Philip [name], male   DOB: [birth year], 80 y.o.   MRN: [id number]    Location:  [facility] (12)    Allergies  Allergen Reactions  . Cephalosporins Rash  . Suprax [Cefixime] Rash    No chief complaint on file.   HPI:  Depression  Started on Wellbutrin. He is not sure is helping any yet. However, TSH is still rising.  Tonsillar cancer  Seen by Dr. Juluis Rainier on 01/02/13 to evaluate a painless neck mass he had discovered in his right neck a month earlier. An FNA was done at his visit on 01/02/13 which chronic sialoadenitis and no evidence of malignancy. Seen again in Jan 2015. He claims the mass has not changed since our last visit and is not painful or bothersome. He has mild xerostomia. He denies dysgeusia, dysphagia, odynophagia, globus sensation, trismus, otalgia, voice changes, neck mass, or recent weight loss. He additionally denies fevers or chills  HTN (hypertension): controlled  Unspecified hypothyroidism; TSH still rising. He is taking the supplement daily    Medications: Patient's Medications  New Prescriptions   No medications on file  Previous Medications   ASCORBIC ACID (VITAMIN C PO)    Take 1 capsule by mouth every other day. 8000 MG   BROMELAIN 100 MG TABS    Take 150 mg by mouth 2 (two) times daily. Take 1 1/2 tablet twice daily.   BUPROPION (WELLBUTRIN) 100 MG TABLET    Take one tablet by mouth twice daily   CALCIUM CARBONATE 1500 MG TABS    Take 1 tablet by mouth daily.    CHOLECALCIFEROL (VITAMIN D3) 1000 UNITS CAPS    Take by mouth. Take 4,000 daily   CHONDROITIN SULFATE A 150 MG CAPS    Take by mouth.   COENZYME Q10 (COQ10) 100 MG CAPS    Take 100 mg by mouth daily. Take one daily   COLLAGEN PO    Take 600 mg by mouth 2 (two) times daily. Take 600 mg twice daily.   CYANOCOBALAMIN (,VITAMIN B-12,) 1000 MCG/ML INJECTION    Inject 1 mL (1,000 mcg total) into the muscle every 21 ( twenty-one) days.   DIGESTIVE ENZYMES PO    Take 500 mg by mouth daily. Take 500 mg twice daily   FISH OIL-OMEGA-3 FATTY ACIDS 1000 MG CAPSULE    Take 2 g by mouth daily.   GLUCOSAMINE-CHONDROITIN-MSM PO    Take 500 mg by mouth 2 (two) times daily. Take 500 mg twice daily.   HYALURONIC ACID-VITAMIN C (HYALURONIC ACID PO)    Take 100 mg by mouth 2 (two) times daily. 100mg  twice a day   LEVOTHYROXINE (SYNTHROID, LEVOTHROID) 75 MCG TABLET    Take 75 mcg by mouth daily before breakfast.   LISINOPRIL (PRINIVIL,ZESTRIL) 10 MG TABLET    Take 1 tablet (10 mg total) by mouth daily.   MULTIPLE VITAMINS-MINERALS (MULTIVITAMINS THER. W/MINERALS) TABS    Take 2 tablets by mouth 2 (two) times daily.    QUERCETIN PO    Take 250 mg by mouth daily. Take 500 mg daily   RESVERATROL PO    Take 100 mg by mouth 2 (two) times daily. Take 2 100 mg tablets twice daily.   TURMERIC, CURCUMA LONGA, (CURCUMIN EXTRACT) POWD    Take by mouth.   VITAMIN E 400 UNIT CAPSULE    Take 400 Units by mouth daily. Take 1 tablet daily for vitamin e supplement.  Modified Medications  No medications on file  Discontinued Medications   MULTIPLE VITAMIN (MULTI-VITAMINS) TABS    Take by mouth.   TURMERIC CURCUMIN 500 MG CAPS    Take by mouth. Take two capsules daily     Review of Systems  Constitutional: Positive for activity change, appetite change and fatigue.  HENT: Negative for drooling, ear pain, sore throat, trouble swallowing and voice change.   Eyes: Negative.   Respiratory: Negative for cough, choking and chest tightness.        History of sleep apnea.  Cardiovascular: Negative for chest pain, palpitations and leg swelling.  Gastrointestinal: Negative for nausea, abdominal pain, diarrhea and abdominal distention.       Dry mouth and difficulty swallowing.  Genitourinary: Negative for urgency, difficulty urinating, penile pain and testicular pain.  Musculoskeletal: Positive for arthralgias and myalgias. Negative for back pain, gait problem,  joint swelling and neck pain.  Skin:       History of actinic keratoses and skin cancers.  Neurological: Positive for weakness. Negative for dizziness, tremors, seizures, syncope, facial asymmetry, speech difficulty, light-headedness and headaches.  Hematological: Negative.   Psychiatric/Behavioral: Positive for sleep disturbance and dysphoric mood. Negative for suicidal ideas, hallucinations, behavioral problems, confusion, self-injury, decreased concentration and agitation. The patient is nervous/anxious. The patient is not hyperactive.        Insomnia. Patient and wife are involved in sex therapy at [address] with Dr. Purvis Sheffield.    Filed Vitals:   04/01/13 1341  BP: 116/70  Pulse: 48  Height: 5\' 11"  (1.803 m)  Weight: 160 lb (72.576 kg)   Physical Exam  Constitutional: He is oriented to person, place, and time. He appears well-developed and well-nourished. No distress.  HENT:  Right Ear: External ear normal.  Left Ear: External ear normal.  Nose: Nose normal.  Mouth/Throat: No oropharyngeal exudate.  Eyes: Conjunctivae and EOM are normal. Pupils are equal, round, and reactive to light. Right eye exhibits discharge. Left eye exhibits no discharge.  Neck: Normal range of motion. Neck supple. No JVD present. No tracheal deviation present. No thyromegaly present.  Nodule right neck and in similar position on the left neck. Nontender and present since late November 2014.  Cardiovascular: Normal rate, regular rhythm, normal heart sounds and intact distal pulses.  Exam reveals no gallop and no friction rub.   No murmur heard. Pulmonary/Chest: Effort normal and breath sounds normal. No respiratory distress. He has no wheezes. He has no rales. He exhibits no tenderness.  Abdominal: Soft. Bowel sounds are normal. He exhibits no distension and no mass. There is no tenderness.  Musculoskeletal: Normal range of motion. He exhibits no edema and no tenderness.  Moderate discomfort with palpation  left greater trochanter.  Lymphadenopathy:    He has no cervical adenopathy.  Neurological: He is alert and oriented to person, place, and time. He has normal reflexes. No cranial nerve deficit. Coordination normal.  Skin: No rash noted. No erythema. No pallor.  Multiple actinic keratoses.  Psychiatric: He has a normal mood and affect. His behavior is normal. Judgment and thought content normal.     Labs reviewed: Nursing Home on 04/01/2013  Component Date Value Ref Range Status  . TSH 02/07/2013 4.51  0.41 - 5.90 uIU/mL Final  . TSH 03/28/2013 7.94* 0.41 - 5.90 uIU/mL Final      Assessment/Plan  1. Depression Continue Wellbutrin   2. Tonsillar cancer Has bilateral neck nodules that are likely benign sialadenitis based on biopsy  3. HTN (hypertension) controlled  4. Unspecified hypothyroidism Rising TSH. Will increase levothyroxine. - levothyroxine (SYNTHROID, LEVOTHROID) 100 MCG tablet; One daily for thyroid supplement  Dispense: 90 tablet; Refill: 3

## 2013-04-17 ENCOUNTER — Encounter: Payer: Self-pay | Admitting: Internal Medicine

## 2013-04-17 DIAGNOSIS — T66XXXS Radiation sickness, unspecified, sequela: Secondary | ICD-10-CM | POA: Diagnosis not present

## 2013-04-17 DIAGNOSIS — K089 Disorder of teeth and supporting structures, unspecified: Secondary | ICD-10-CM | POA: Diagnosis not present

## 2013-05-06 DIAGNOSIS — M8708 Idiopathic aseptic necrosis of bone, other site: Secondary | ICD-10-CM | POA: Diagnosis not present

## 2013-05-06 DIAGNOSIS — T66XXXS Radiation sickness, unspecified, sequela: Secondary | ICD-10-CM | POA: Diagnosis not present

## 2013-05-06 DIAGNOSIS — C099 Malignant neoplasm of tonsil, unspecified: Secondary | ICD-10-CM | POA: Diagnosis not present

## 2013-05-07 DIAGNOSIS — T66XXXS Radiation sickness, unspecified, sequela: Secondary | ICD-10-CM | POA: Diagnosis not present

## 2013-05-07 DIAGNOSIS — C099 Malignant neoplasm of tonsil, unspecified: Secondary | ICD-10-CM | POA: Diagnosis not present

## 2013-05-07 DIAGNOSIS — K117 Disturbances of salivary secretion: Secondary | ICD-10-CM | POA: Diagnosis not present

## 2013-05-07 DIAGNOSIS — C770 Secondary and unspecified malignant neoplasm of lymph nodes of head, face and neck: Secondary | ICD-10-CM | POA: Diagnosis not present

## 2013-05-08 DIAGNOSIS — R070 Pain in throat: Secondary | ICD-10-CM | POA: Diagnosis not present

## 2013-05-08 DIAGNOSIS — T66XXXS Radiation sickness, unspecified, sequela: Secondary | ICD-10-CM | POA: Diagnosis not present

## 2013-05-09 DIAGNOSIS — T66XXXS Radiation sickness, unspecified, sequela: Secondary | ICD-10-CM | POA: Diagnosis not present

## 2013-05-09 DIAGNOSIS — C099 Malignant neoplasm of tonsil, unspecified: Secondary | ICD-10-CM | POA: Diagnosis not present

## 2013-05-09 DIAGNOSIS — K089 Disorder of teeth and supporting structures, unspecified: Secondary | ICD-10-CM | POA: Diagnosis not present

## 2013-05-10 DIAGNOSIS — C099 Malignant neoplasm of tonsil, unspecified: Secondary | ICD-10-CM | POA: Diagnosis not present

## 2013-05-10 DIAGNOSIS — K0889 Other specified disorders of teeth and supporting structures: Secondary | ICD-10-CM | POA: Diagnosis not present

## 2013-05-10 DIAGNOSIS — T66XXXS Radiation sickness, unspecified, sequela: Secondary | ICD-10-CM | POA: Diagnosis not present

## 2013-05-13 DIAGNOSIS — T66XXXS Radiation sickness, unspecified, sequela: Secondary | ICD-10-CM | POA: Diagnosis not present

## 2013-05-14 DIAGNOSIS — R439 Unspecified disturbances of smell and taste: Secondary | ICD-10-CM | POA: Diagnosis not present

## 2013-05-14 DIAGNOSIS — T66XXXS Radiation sickness, unspecified, sequela: Secondary | ICD-10-CM | POA: Diagnosis not present

## 2013-05-14 DIAGNOSIS — K029 Dental caries, unspecified: Secondary | ICD-10-CM | POA: Diagnosis not present

## 2013-05-14 DIAGNOSIS — K117 Disturbances of salivary secretion: Secondary | ICD-10-CM | POA: Diagnosis not present

## 2013-05-14 DIAGNOSIS — Z85819 Personal history of malignant neoplasm of unspecified site of lip, oral cavity, and pharynx: Secondary | ICD-10-CM | POA: Diagnosis not present

## 2013-05-14 DIAGNOSIS — K089 Disorder of teeth and supporting structures, unspecified: Secondary | ICD-10-CM | POA: Diagnosis not present

## 2013-05-15 DIAGNOSIS — K0889 Other specified disorders of teeth and supporting structures: Secondary | ICD-10-CM | POA: Diagnosis not present

## 2013-05-15 DIAGNOSIS — M272 Inflammatory conditions of jaws: Secondary | ICD-10-CM | POA: Diagnosis not present

## 2013-05-15 DIAGNOSIS — T66XXXS Radiation sickness, unspecified, sequela: Secondary | ICD-10-CM | POA: Diagnosis not present

## 2013-05-16 DIAGNOSIS — K1370 Unspecified lesions of oral mucosa: Secondary | ICD-10-CM | POA: Diagnosis not present

## 2013-05-16 DIAGNOSIS — T66XXXS Radiation sickness, unspecified, sequela: Secondary | ICD-10-CM | POA: Diagnosis not present

## 2013-05-16 DIAGNOSIS — C099 Malignant neoplasm of tonsil, unspecified: Secondary | ICD-10-CM | POA: Diagnosis not present

## 2013-05-17 DIAGNOSIS — T66XXXS Radiation sickness, unspecified, sequela: Secondary | ICD-10-CM | POA: Diagnosis not present

## 2013-05-21 DIAGNOSIS — Z8546 Personal history of malignant neoplasm of prostate: Secondary | ICD-10-CM | POA: Diagnosis not present

## 2013-05-21 DIAGNOSIS — E039 Hypothyroidism, unspecified: Secondary | ICD-10-CM | POA: Diagnosis not present

## 2013-05-23 ENCOUNTER — Telehealth: Payer: Self-pay

## 2013-05-23 NOTE — Telephone Encounter (Signed)
Spoke with wife TSH normal continue same dose of medication. PSA normal 0.02. Next appt with Dr. Nyoka Cowden 07/01/13

## 2013-06-05 ENCOUNTER — Telehealth: Payer: Self-pay | Admitting: *Deleted

## 2013-06-05 NOTE — Telephone Encounter (Signed)
Patient wants Methylcobalamin (B12) instead of the Cyanocobalamin. Would like a Rx faxed to [facility] with a Dx listed ([phone]) Please Advise if this is ok.

## 2013-06-05 NOTE — Telephone Encounter (Signed)
I think the only form is a tablet.

## 2013-06-05 NOTE — Telephone Encounter (Signed)
Patient wants to know if this is available in an injectable. If not he will give this a try and then come in about a month for bloodwork to see if its working.

## 2013-06-05 NOTE — Telephone Encounter (Signed)
OK to call in for methylcobalamin 5000 mcg (#100) tablets 1 daily for B12 deficiency. This is available OTC without prescription.

## 2013-06-06 NOTE — Telephone Encounter (Signed)
Patient Notified

## 2013-06-11 DIAGNOSIS — T66XXXS Radiation sickness, unspecified, sequela: Secondary | ICD-10-CM | POA: Diagnosis not present

## 2013-06-12 DIAGNOSIS — T66XXXS Radiation sickness, unspecified, sequela: Secondary | ICD-10-CM | POA: Diagnosis not present

## 2013-06-13 ENCOUNTER — Encounter: Payer: Self-pay | Admitting: Internal Medicine

## 2013-06-13 DIAGNOSIS — M278 Other specified diseases of jaws: Secondary | ICD-10-CM | POA: Diagnosis not present

## 2013-06-13 DIAGNOSIS — T66XXXS Radiation sickness, unspecified, sequela: Secondary | ICD-10-CM | POA: Diagnosis not present

## 2013-06-14 DIAGNOSIS — T66XXXS Radiation sickness, unspecified, sequela: Secondary | ICD-10-CM | POA: Diagnosis not present

## 2013-06-14 DIAGNOSIS — M272 Inflammatory conditions of jaws: Secondary | ICD-10-CM | POA: Diagnosis not present

## 2013-06-17 DIAGNOSIS — R439 Unspecified disturbances of smell and taste: Secondary | ICD-10-CM | POA: Diagnosis not present

## 2013-06-17 DIAGNOSIS — T66XXXS Radiation sickness, unspecified, sequela: Secondary | ICD-10-CM | POA: Diagnosis not present

## 2013-06-17 DIAGNOSIS — K117 Disturbances of salivary secretion: Secondary | ICD-10-CM | POA: Diagnosis not present

## 2013-06-17 DIAGNOSIS — K0889 Other specified disorders of teeth and supporting structures: Secondary | ICD-10-CM | POA: Diagnosis not present

## 2013-06-18 DIAGNOSIS — M278 Other specified diseases of jaws: Secondary | ICD-10-CM | POA: Diagnosis not present

## 2013-06-18 DIAGNOSIS — T66XXXS Radiation sickness, unspecified, sequela: Secondary | ICD-10-CM | POA: Diagnosis not present

## 2013-06-19 DIAGNOSIS — T66XXXS Radiation sickness, unspecified, sequela: Secondary | ICD-10-CM | POA: Diagnosis not present

## 2013-06-19 DIAGNOSIS — M278 Other specified diseases of jaws: Secondary | ICD-10-CM | POA: Diagnosis not present

## 2013-06-19 DIAGNOSIS — C099 Malignant neoplasm of tonsil, unspecified: Secondary | ICD-10-CM | POA: Diagnosis not present

## 2013-06-24 ENCOUNTER — Telehealth: Payer: Self-pay

## 2013-06-24 NOTE — Telephone Encounter (Signed)
Patient is requesting orders to have B12, Vit D, TSH, Ft3, & Ft4 levels check.  Left message on VM for patient to return call when available

## 2013-06-25 DIAGNOSIS — C099 Malignant neoplasm of tonsil, unspecified: Secondary | ICD-10-CM | POA: Diagnosis not present

## 2013-06-25 DIAGNOSIS — Z09 Encounter for follow-up examination after completed treatment for conditions other than malignant neoplasm: Secondary | ICD-10-CM | POA: Diagnosis not present

## 2013-06-25 DIAGNOSIS — Z85819 Personal history of malignant neoplasm of unspecified site of lip, oral cavity, and pharynx: Secondary | ICD-10-CM | POA: Diagnosis not present

## 2013-06-25 DIAGNOSIS — C770 Secondary and unspecified malignant neoplasm of lymph nodes of head, face and neck: Secondary | ICD-10-CM | POA: Diagnosis not present

## 2013-06-27 DIAGNOSIS — E538 Deficiency of other specified B group vitamins: Secondary | ICD-10-CM | POA: Diagnosis not present

## 2013-06-28 NOTE — Telephone Encounter (Signed)
Patient called back, patient is seeing Dr.Green on Monday. Patient states he no longer would like to take b12 injections because he has taken them x several years. Patient would like to try b12 pill instead. Patient also states he needs a more detailed thyroid panel in the future.  I informed patient that he should address these concerns on Monday with Dr.Green.Jackelyn Poling was informed of patient's concerns.

## 2013-07-01 ENCOUNTER — Non-Acute Institutional Stay: Payer: Medicare Other | Admitting: Internal Medicine

## 2013-07-01 ENCOUNTER — Encounter: Payer: Self-pay | Admitting: Internal Medicine

## 2013-07-01 VITALS — BP 138/78 | HR 80 | Temp 98.4°F | Resp 20 | Ht 71.0 in | Wt 155.4 lb

## 2013-07-01 DIAGNOSIS — C099 Malignant neoplasm of tonsil, unspecified: Secondary | ICD-10-CM | POA: Diagnosis not present

## 2013-07-01 DIAGNOSIS — F529 Unspecified sexual dysfunction not due to a substance or known physiological condition: Secondary | ICD-10-CM

## 2013-07-01 DIAGNOSIS — N539 Unspecified male sexual dysfunction: Secondary | ICD-10-CM

## 2013-07-01 DIAGNOSIS — F329 Major depressive disorder, single episode, unspecified: Secondary | ICD-10-CM

## 2013-07-01 DIAGNOSIS — F32A Depression, unspecified: Secondary | ICD-10-CM

## 2013-07-01 DIAGNOSIS — F3289 Other specified depressive episodes: Secondary | ICD-10-CM

## 2013-07-01 DIAGNOSIS — E039 Hypothyroidism, unspecified: Secondary | ICD-10-CM

## 2013-07-01 DIAGNOSIS — I10 Essential (primary) hypertension: Secondary | ICD-10-CM

## 2013-07-11 ENCOUNTER — Encounter: Payer: Self-pay | Admitting: Internal Medicine

## 2013-07-25 ENCOUNTER — Other Ambulatory Visit: Payer: Self-pay | Admitting: Geriatric Medicine

## 2013-07-28 NOTE — Progress Notes (Signed)
Patient ID: Philip [name], male   DOB: Oct 14, 1932, 80 y.o.   MRN: [id number]    Location:  [facility] (12)    Allergies  Allergen Reactions  . Cephalosporins Rash  . Suprax [Cefixime] Rash    Chief Complaint  Patient presents with  . Medical Management of Chronic Issues    HPI:  Unspecified hypothyroidism: Stable  Tonsillar cancer: No recurrence  Essential hypertension: Controlled  Male sexual dysfunction: Patient is involved in sex therapy with a counselor and his wife.  Depression: Continues on bupropion. He believes he is benefiting from this medication.   Medications: Patient's Medications  New Prescriptions   No medications on file  Previous Medications   ASCORBIC ACID (VITAMIN C PO)    Take 1 capsule by mouth every other day. 8000 MG   BROMELAIN 100 MG TABS    Take 150 mg by mouth 2 (two) times daily. Take 1 1/2 tablet twice daily.   BUPROPION (WELLBUTRIN) 100 MG TABLET    Take one tablet by mouth twice daily   CALCIUM CARBONATE 1500 MG TABS    Take 1 tablet by mouth daily.    CHOLECALCIFEROL (VITAMIN D3) 1000 UNITS CAPS    Take by mouth. Take 4,000 daily   CHONDROITIN SULFATE A 150 MG CAPS    Take by mouth.   COENZYME Q10 (COQ10) 100 MG CAPS    Take 100 mg by mouth daily. Take one daily   COLLAGEN PO    Take 600 mg by mouth 2 (two) times daily. Take 600 mg twice daily.   CYANOCOBALAMIN (,VITAMIN B-12,) 1000 MCG/ML INJECTION    Inject 1 mL (1,000 mcg total) into the muscle every 21 ( twenty-one) days.   DIGESTIVE ENZYMES PO    Take 500 mg by mouth daily. Take 500 mg twice daily   FISH OIL-OMEGA-3 FATTY ACIDS 1000 MG CAPSULE    Take 2 g by mouth daily.   GLUCOSAMINE-CHONDROITIN-MSM PO    Take 500 mg by mouth 2 (two) times daily. Take 500 mg twice daily.   HYALURONIC ACID-VITAMIN C (HYALURONIC ACID PO)    Take 100 mg by mouth 2 (two) times daily. 100mg  twice a day   LEVOTHYROXINE (SYNTHROID, LEVOTHROID) 100 MCG  TABLET    One daily for thyroid supplement   LISINOPRIL (PRINIVIL,ZESTRIL) 10 MG TABLET    Take 1 tablet (10 mg total) by mouth daily.   MULTIPLE VITAMINS-MINERALS (MULTIVITAMINS THER. W/MINERALS) TABS    Take 2 tablets by mouth 2 (two) times daily.    QUERCETIN DIHYDRATE POWD    Take by mouth.   QUERCETIN PO    Take 250 mg by mouth daily. Take 500 mg daily   RESVERATROL PO    Take 100 mg by mouth 2 (two) times daily. Take 2 100 mg tablets twice daily.   TURMERIC, CURCUMA LONGA, (CURCUMIN EXTRACT) POWD    Take by mouth.   VITAMIN E 400 UNIT CAPSULE    Take 400 Units by mouth daily. Take 1 tablet daily for vitamin e supplement.  Modified Medications   Modified Medication Previous Medication   LISINOPRIL (PRINIVIL,ZESTRIL) 10 MG TABLET lisinopril (PRINIVIL,ZESTRIL) 10 MG tablet      TAKE ONE TABLET BY MOUTH ONCE DAILY    Take 1 tablet (10 mg total) by mouth daily.  Discontinued Medications   No medications on file     Review of Systems  Constitutional: Positive for activity change, appetite change and fatigue.  HENT: Negative for drooling, ear pain, sore throat, trouble swallowing and voice change.   Eyes: Negative.   Respiratory: Negative for cough, choking and chest tightness.        History of sleep apnea.  Cardiovascular: Negative for chest pain, palpitations and leg swelling.  Gastrointestinal: Negative for nausea, abdominal pain, diarrhea and abdominal distention.       Dry mouth and difficulty swallowing.  Genitourinary: Negative for urgency, difficulty urinating, penile pain and testicular pain.       History of prostate cancer and seed implants. Sexual dysfunction following this.  Musculoskeletal: Positive for arthralgias and myalgias. Negative for back pain, gait problem, joint swelling and neck pain.  Skin:       History of actinic keratoses and skin cancers.  Neurological: Positive for weakness. Negative for dizziness, tremors, seizures, syncope, facial asymmetry, speech  difficulty, light-headedness and headaches.  Hematological: Negative.   Psychiatric/Behavioral: Positive for sleep disturbance and dysphoric mood. Negative for suicidal ideas, hallucinations, behavioral problems, confusion, self-injury, decreased concentration and agitation. The patient is nervous/anxious. The patient is not hyperactive.        Insomnia. Patient and wife are involved in sex therapy at 23 with Dr. Purvis Sheffield.    Filed Vitals:   07/01/13 1559  BP: 138/78  Pulse: 80  Temp: 98.4 F (36.9 C)  TempSrc: Oral  Resp: 20  Height: 5\' 11"  (1.803 m)  Weight: 155 lb 6.4 oz (70.489 kg)   Body mass index is 21.68 kg/(m^2).  Physical Exam  Constitutional: He is oriented to person, place, and time. He appears well-developed and well-nourished. No distress.  HENT:  Right Ear: External ear normal.  Left Ear: External ear normal.  Nose: Nose normal.  Mouth/Throat: No oropharyngeal exudate.  Eyes: Conjunctivae and EOM are normal. Pupils are equal, round, and reactive to light. Right eye exhibits discharge. Left eye exhibits no discharge.  Neck: Normal range of motion. Neck supple. No JVD present. No tracheal deviation present. No thyromegaly present.  Nodule right neck and in similar position on the left neck. Nontender and present since late November 2014.  Cardiovascular: Normal rate, regular rhythm, normal heart sounds and intact distal pulses.  Exam reveals no gallop and no friction rub.   No murmur heard. Pulmonary/Chest: Effort normal and breath sounds normal. No respiratory distress. He has no wheezes. He has no rales. He exhibits no tenderness.  Abdominal: Soft. Bowel sounds are normal. He exhibits no distension and no mass. There is no tenderness.  Musculoskeletal: Normal range of motion. He exhibits no edema and no tenderness.  Moderate discomfort with palpation left greater trochanter.  Lymphadenopathy:    He has no cervical adenopathy.  Neurological: He is alert and  oriented to person, place, and time. He has normal reflexes. No cranial nerve deficit. Coordination normal.  Skin: No rash noted. No erythema. No pallor.  Multiple actinic keratoses.  Psychiatric: He has a normal mood and affect. His behavior is normal. Judgment and thought content normal.     Labs reviewed:  Nursing Home on 04/01/2013  Component Date Value Ref Range Status  . TSH 02/07/2013 4.51  0.41 - 5.90 uIU/mL Final  . TSH 03/28/2013 7.94* 0.41 - 5.90 uIU/mL Final   06/27/13 B12: 774   Assessment/Plan  1. Unspecified hypothyroidism compensated  2. Tonsillar cancer No recurrence  3. Essential hypertension Controlled  4. Male sexual dysfunction Continue with counselor  5. Depression Continue bupropion

## 2013-08-08 DIAGNOSIS — H3530 Unspecified macular degeneration: Secondary | ICD-10-CM | POA: Diagnosis not present

## 2013-08-08 DIAGNOSIS — D3130 Benign neoplasm of unspecified choroid: Secondary | ICD-10-CM | POA: Diagnosis not present

## 2013-08-08 DIAGNOSIS — Z961 Presence of intraocular lens: Secondary | ICD-10-CM | POA: Diagnosis not present

## 2013-08-08 DIAGNOSIS — H532 Diplopia: Secondary | ICD-10-CM | POA: Diagnosis not present

## 2013-08-12 ENCOUNTER — Encounter: Payer: Medicare Other | Admitting: Internal Medicine

## 2013-10-03 DIAGNOSIS — E538 Deficiency of other specified B group vitamins: Secondary | ICD-10-CM | POA: Diagnosis not present

## 2013-10-29 DIAGNOSIS — R5381 Other malaise: Secondary | ICD-10-CM | POA: Diagnosis not present

## 2013-10-29 DIAGNOSIS — E785 Hyperlipidemia, unspecified: Secondary | ICD-10-CM | POA: Diagnosis not present

## 2013-10-29 DIAGNOSIS — R5383 Other fatigue: Secondary | ICD-10-CM | POA: Diagnosis not present

## 2013-10-29 DIAGNOSIS — Z8546 Personal history of malignant neoplasm of prostate: Secondary | ICD-10-CM | POA: Diagnosis not present

## 2013-10-29 DIAGNOSIS — E538 Deficiency of other specified B group vitamins: Secondary | ICD-10-CM | POA: Diagnosis not present

## 2013-10-29 DIAGNOSIS — R634 Abnormal weight loss: Secondary | ICD-10-CM | POA: Diagnosis not present

## 2013-10-29 LAB — LIPID PANEL
Cholesterol: 288 mg/dL — AB (ref 0–200)
HDL: 63 mg/dL (ref 35–70)
LDL CALC: 200 mg/dL
LDl/HDL Ratio: 3.2
Triglycerides: 106 mg/dL (ref 40–160)

## 2013-10-29 LAB — BASIC METABOLIC PANEL
BUN: 20 mg/dL (ref 4–21)
Creatinine: 1.1 mg/dL (ref 0.6–1.3)
Glucose: 82 mg/dL
Potassium: 4.2 mmol/L (ref 3.4–5.3)
Sodium: 142 mmol/L (ref 137–147)

## 2013-10-29 LAB — HEPATIC FUNCTION PANEL
ALT: 11 U/L (ref 10–40)
AST: 15 U/L (ref 14–40)
Alkaline Phosphatase: 47 U/L (ref 25–125)
BILIRUBIN, TOTAL: 0.7 mg/dL

## 2013-10-29 LAB — TSH: TSH: 3.44 u[IU]/mL (ref 0.41–5.90)

## 2013-11-04 ENCOUNTER — Encounter: Payer: Self-pay | Admitting: Internal Medicine

## 2013-11-04 ENCOUNTER — Non-Acute Institutional Stay: Payer: Medicare Other | Admitting: Internal Medicine

## 2013-11-04 VITALS — BP 136/84 | HR 74 | Temp 98.4°F | Wt 157.0 lb

## 2013-11-04 DIAGNOSIS — E038 Other specified hypothyroidism: Secondary | ICD-10-CM

## 2013-11-04 DIAGNOSIS — C099 Malignant neoplasm of tonsil, unspecified: Secondary | ICD-10-CM

## 2013-11-04 DIAGNOSIS — E538 Deficiency of other specified B group vitamins: Secondary | ICD-10-CM

## 2013-11-04 DIAGNOSIS — R5382 Chronic fatigue, unspecified: Secondary | ICD-10-CM

## 2013-11-04 DIAGNOSIS — E785 Hyperlipidemia, unspecified: Secondary | ICD-10-CM

## 2013-11-04 DIAGNOSIS — I10 Essential (primary) hypertension: Secondary | ICD-10-CM

## 2013-11-04 DIAGNOSIS — E034 Atrophy of thyroid (acquired): Secondary | ICD-10-CM

## 2013-11-04 MED ORDER — SILDENAFIL CITRATE 20 MG PO TABS: ORAL_TABLET | ORAL | Status: DC

## 2013-11-04 MED ORDER — METHYLCOBALAMIN 1 MG PO CHEW: CHEWABLE_TABLET | ORAL | Status: DC

## 2013-11-04 NOTE — Progress Notes (Signed)
Patient ID: Philip [name], male   DOB: 23-Jan-1932, 80 y.o.   MRN: [id number]    [place] of Service: Clinic (12)    Allergies  Allergen Reactions  . Cephalosporins Rash  . Suprax [Cefixime] Rash    Chief Complaint  Patient presents with  . Medical Management of Chronic Issues    blood pressure, thyroid, B12 deficiency    HPI:  Malignant neoplasm of tonsil no relapse. Had irradiation and subsequent hypothroidism  Vitamin B 12 deficiency: Was getting injections, but stopped them in May 2015 and started Methylcobalamin oral. B12 subsequently rose to >2000, so he stopped oral supplement in Sept 16, 2015. Now B12 is 545.  Hypothyroidism due to acquired atrophy of thyroid: stable  Chronic fatigue: stable  Essential hypertension    Medications: Patient's Medications  New Prescriptions   No medications on file  Previous Medications   ASCORBIC ACID (VITAMIN C PO)    Take 1 capsule by mouth every other day. 8000 MG   BROMELAIN 100 MG TABS    Take 150 mg by mouth 2 (two) times daily. Take 1 1/2 tablet twice daily.   BUPROPION (WELLBUTRIN) 100 MG TABLET    Take one tablet by mouth twice daily   CALCIUM CARBONATE 1500 MG TABS    Take 1 tablet by mouth daily.    CHOLECALCIFEROL (VITAMIN D3) 1000 UNITS CAPS    Take by mouth. Take 4,000 daily   CHONDROITIN SULFATE A 150 MG CAPS    Take by mouth.   COENZYME Q10 (COQ10) 100 MG CAPS    Take 100 mg by mouth daily. Take one daily   COLLAGEN PO    Take 600 mg by mouth 2 (two) times daily. Take 600 mg twice daily.   COLOSTRUM PO    Take by mouth. One teaspoon daily 1 oz of water   DIGESTIVE ENZYMES PO    Take 500 mg by mouth daily. Take 500 mg twice daily   FISH OIL-OMEGA-3 FATTY ACIDS 1000 MG CAPSULE    Take 2 g by mouth daily.   GLUCOSAMINE-CHONDROITIN-MSM PO    Take 500 mg by mouth 2 (two) times daily. Take 500 mg twice daily.   HYALURONIC ACID-VITAMIN C (HYALURONIC ACID PO)    Take 100 mg by  mouth 2 (two) times daily. 100mg  twice a day   LEVOTHYROXINE (SYNTHROID, LEVOTHROID) 100 MCG TABLET    One daily for thyroid supplement   LISINOPRIL (PRINIVIL,ZESTRIL) 10 MG TABLET    Take 1 tablet (10 mg total) by mouth daily.   MULTIPLE VITAMINS-MINERALS (MULTIVITAMINS THER. W/MINERALS) TABS    Take 2 tablets by mouth 2 (two) times daily.    QUERCETIN DIHYDRATE POWD    Take by mouth.   QUERCETIN PO    Take 250 mg by mouth daily. Take 500 mg daily   RESVERATROL PO    Take 100 mg by mouth 2 (two) times daily. Take 2 100 mg tablets twice daily.   TURMERIC, CURCUMA LONGA, (CURCUMIN EXTRACT) POWD    Take by mouth.   VITAMIN E 400 UNIT CAPSULE    Take 400 Units by mouth daily. Take 1 tablet daily for vitamin e supplement.  Modified Medications   No medications on file  Discontinued Medications   CYANOCOBALAMIN (,VITAMIN B-12,) 1000 MCG/ML INJECTION    Inject 1 mL (1,000 mcg total) into the muscle every 21 ( twenty-one) days.   LISINOPRIL (PRINIVIL,ZESTRIL) 10 MG TABLET    TAKE  ONE TABLET BY MOUTH ONCE DAILY     Review of Systems  Constitutional: Positive for activity change, appetite change and fatigue.  HENT: Negative for drooling, ear pain, sore throat, trouble swallowing and voice change.   Eyes: Negative.   Respiratory: Negative for cough, choking and chest tightness.        History of sleep apnea.  Cardiovascular: Negative for chest pain, palpitations and leg swelling.  Gastrointestinal: Negative for nausea, abdominal pain, diarrhea and abdominal distention.       Dry mouth and difficulty swallowing.  Genitourinary: Negative for urgency, difficulty urinating, penile pain and testicular pain.       History of prostate cancer and seed implants. Sexual dysfunction following this. Has seen coounselor and is using Viagra with success.  Musculoskeletal: Positive for arthralgias and myalgias. Negative for back pain, gait problem, joint swelling and neck pain.  Skin:       History of actinic  keratoses and skin cancers.  Neurological: Positive for weakness. Negative for dizziness, tremors, seizures, syncope, facial asymmetry, speech difficulty, light-headedness and headaches.  Hematological: Negative.   Psychiatric/Behavioral: Positive for sleep disturbance and dysphoric mood. Negative for suicidal ideas, hallucinations, behavioral problems, confusion, self-injury, decreased concentration and agitation. The patient is nervous/anxious. The patient is not hyperactive.        Insomnia. Patient and wife are involved in sex therapy at 74 with Dr. Purvis Sheffield.    Filed Vitals:   11/04/13 1437  BP: 136/84  Pulse: 74  Temp: 98.4 F (36.9 C)  TempSrc: Oral  Weight: 157 lb (71.215 kg)   Body mass index is 21.91 kg/(m^2).  Physical Exam  Constitutional: He is oriented to person, place, and time. He appears well-developed and well-nourished. No distress.  HENT:  Right Ear: External ear normal.  Left Ear: External ear normal.  Nose: Nose normal.  Mouth/Throat: No oropharyngeal exudate.  Eyes: Conjunctivae and EOM are normal. Pupils are equal, round, and reactive to light. Right eye exhibits discharge. Left eye exhibits no discharge.  Neck: Normal range of motion. Neck supple. No JVD present. No tracheal deviation present. No thyromegaly present.  Nodule right neck and in similar position on the left neck. Nontender and present since late November 2014.  Cardiovascular: Normal rate, regular rhythm, normal heart sounds and intact distal pulses.  Exam reveals no gallop and no friction rub.   No murmur heard. Pulmonary/Chest: Effort normal and breath sounds normal. No respiratory distress. He has no wheezes. He has no rales. He exhibits no tenderness.  Abdominal: Soft. Bowel sounds are normal. He exhibits no distension and no mass. There is no tenderness.  Musculoskeletal: Normal range of motion. He exhibits no edema and no tenderness.  Moderate discomfort with palpation left greater  trochanter.  Lymphadenopathy:    He has no cervical adenopathy.  Neurological: He is alert and oriented to person, place, and time. He has normal reflexes. No cranial nerve deficit. Coordination normal.  Skin: No rash noted. No erythema. No pallor.  Multiple actinic keratoses.  Psychiatric: He has a normal mood and affect. His behavior is normal. Judgment and thought content normal.     Labs reviewed: [facility] Home on 11/04/2013  Component Date Value Ref Range Status  . Glucose 10/29/2013 82   Final  . BUN 10/29/2013 20  4 - 21 mg/dL Final  . Creatinine 10/29/2013 1.1  0.6 - 1.3 mg/dL Final  . Potassium 10/29/2013 4.2  3.4 - 5.3 mmol/L Final  . Sodium 10/29/2013 142  137 -  147 mmol/L Final  . LDl/HDL Ratio 10/29/2013 3.2   Final  . Triglycerides 10/29/2013 106  40 - 160 mg/dL Final  . Cholesterol 10/29/2013 288* 0 - 200 mg/dL Final  . HDL 10/29/2013 63  35 - 70 mg/dL Final  . LDL Cholesterol 10/29/2013 200   Final  . Alkaline Phosphatase 10/29/2013 47  25 - 125 U/L Final  . ALT 10/29/2013 11  10 - 40 U/L Final  . AST 10/29/2013 15  14 - 40 U/L Final  . Bilirubin, Total 10/29/2013 0.7   Final  . TSH 10/29/2013 3.44  0.41 - 5.90 uIU/mL Final     Assessment/Plan  1. Malignant neoplasm of tonsil No reoccurrence  2. Vitamin B 12 deficiency He will start supplementing with 1000 mg methylcobalamin  3. Hypothyroidism due to acquired atrophy of thyroid Continue current med  4. Chronic fatigue stable  5. Essential hypertension Controlled  7. Hyperlipidemia. High LDL. Higher than prior tests. Will recheck before starting any new medications.

## 2013-11-13 ENCOUNTER — Other Ambulatory Visit: Payer: Self-pay | Admitting: Internal Medicine

## 2013-11-13 DIAGNOSIS — I10 Essential (primary) hypertension: Secondary | ICD-10-CM

## 2013-11-15 ENCOUNTER — Encounter: Payer: Self-pay | Admitting: Internal Medicine

## 2013-11-20 DIAGNOSIS — D485 Neoplasm of uncertain behavior of skin: Secondary | ICD-10-CM | POA: Diagnosis not present

## 2013-11-20 DIAGNOSIS — L570 Actinic keratosis: Secondary | ICD-10-CM | POA: Diagnosis not present

## 2013-11-20 DIAGNOSIS — L821 Other seborrheic keratosis: Secondary | ICD-10-CM | POA: Diagnosis not present

## 2013-11-20 DIAGNOSIS — Z85828 Personal history of other malignant neoplasm of skin: Secondary | ICD-10-CM | POA: Diagnosis not present

## 2013-11-20 DIAGNOSIS — L814 Other melanin hyperpigmentation: Secondary | ICD-10-CM | POA: Diagnosis not present

## 2013-11-20 DIAGNOSIS — L819 Disorder of pigmentation, unspecified: Secondary | ICD-10-CM | POA: Diagnosis not present

## 2013-11-20 DIAGNOSIS — L219 Seborrheic dermatitis, unspecified: Secondary | ICD-10-CM | POA: Diagnosis not present

## 2013-12-31 DIAGNOSIS — Z85818 Personal history of malignant neoplasm of other sites of lip, oral cavity, and pharynx: Secondary | ICD-10-CM | POA: Diagnosis not present

## 2013-12-31 DIAGNOSIS — Z923 Personal history of irradiation: Secondary | ICD-10-CM | POA: Diagnosis not present

## 2013-12-31 DIAGNOSIS — E039 Hypothyroidism, unspecified: Secondary | ICD-10-CM | POA: Diagnosis not present

## 2013-12-31 DIAGNOSIS — Z9229 Personal history of other drug therapy: Secondary | ICD-10-CM | POA: Insufficient documentation

## 2014-01-29 ENCOUNTER — Telehealth: Payer: Self-pay | Admitting: *Deleted

## 2014-01-29 NOTE — Telephone Encounter (Signed)
Patient called and stated that he needs to have bloodwork done a Cystatin C Level. Left message stating that a Dr. Marcille Buffy abnormal levels of mercury. Tried calling patient back and spoke with wife. Patient not at home and will call back this afternoon.

## 2014-01-29 NOTE — Telephone Encounter (Signed)
This is fine.   We can order it for wherever he gets his labs normally done.  If it is in our office, we would have to make sure [facility] does that test at all.

## 2014-01-29 NOTE — Telephone Encounter (Signed)
Patient called and stated that he saw [place] in [facility] for thyroid. And they are requesting for patient to have the Cystatin C Level done. They are wanting to make sure his liver functions is capable of handling the procedure of getting rid of the heavy metals. Patient has to get it done through [place] due to insurance. Please Advise.

## 2014-01-30 NOTE — Telephone Encounter (Signed)
Patient not at home per wife.

## 2014-02-10 ENCOUNTER — Encounter: Payer: Self-pay | Admitting: *Deleted

## 2014-02-10 NOTE — Telephone Encounter (Signed)
Patient wife notified and given order to Jackelyn Poling to take to [facility] for lab to draw.

## 2014-02-27 DIAGNOSIS — E034 Atrophy of thyroid (acquired): Secondary | ICD-10-CM | POA: Diagnosis not present

## 2014-02-27 DIAGNOSIS — E559 Vitamin D deficiency, unspecified: Secondary | ICD-10-CM | POA: Diagnosis not present

## 2014-02-27 DIAGNOSIS — E538 Deficiency of other specified B group vitamins: Secondary | ICD-10-CM | POA: Diagnosis not present

## 2014-02-27 DIAGNOSIS — Z77018 Contact with and (suspected) exposure to other hazardous metals: Secondary | ICD-10-CM | POA: Diagnosis not present

## 2014-02-27 DIAGNOSIS — E785 Hyperlipidemia, unspecified: Secondary | ICD-10-CM | POA: Diagnosis not present

## 2014-02-27 LAB — TSH: TSH: 0.19 u[IU]/mL — AB (ref 0.41–5.90)

## 2014-02-27 LAB — LIPID PANEL
CHOLESTEROL: 221 mg/dL — AB (ref 0–200)
HDL: 61 mg/dL (ref 35–70)
LDL CALC: 162 mg/dL
LDL/HDL RATIO: 2.7
TRIGLYCERIDES: 77 mg/dL (ref 40–160)

## 2014-03-03 ENCOUNTER — Other Ambulatory Visit: Payer: Self-pay | Admitting: Internal Medicine

## 2014-03-03 ENCOUNTER — Encounter: Payer: Self-pay | Admitting: Internal Medicine

## 2014-03-04 ENCOUNTER — Non-Acute Institutional Stay: Payer: Medicare Other | Admitting: Internal Medicine

## 2014-03-04 ENCOUNTER — Encounter: Payer: Self-pay | Admitting: Internal Medicine

## 2014-03-04 VITALS — BP 120/60 | HR 80 | Temp 97.5°F | Wt 156.0 lb

## 2014-03-04 DIAGNOSIS — E785 Hyperlipidemia, unspecified: Secondary | ICD-10-CM

## 2014-03-04 DIAGNOSIS — E038 Other specified hypothyroidism: Secondary | ICD-10-CM | POA: Diagnosis not present

## 2014-03-04 DIAGNOSIS — T66XXXD Radiation sickness, unspecified, subsequent encounter: Secondary | ICD-10-CM | POA: Diagnosis not present

## 2014-03-04 DIAGNOSIS — F32A Depression, unspecified: Secondary | ICD-10-CM

## 2014-03-04 DIAGNOSIS — R5382 Chronic fatigue, unspecified: Secondary | ICD-10-CM

## 2014-03-04 DIAGNOSIS — C099 Malignant neoplasm of tonsil, unspecified: Secondary | ICD-10-CM | POA: Diagnosis not present

## 2014-03-04 DIAGNOSIS — F329 Major depressive disorder, single episode, unspecified: Secondary | ICD-10-CM | POA: Diagnosis not present

## 2014-03-04 DIAGNOSIS — E034 Atrophy of thyroid (acquired): Secondary | ICD-10-CM

## 2014-03-04 NOTE — Progress Notes (Signed)
Patient ID: Philip [name], male   DOB: February 28, 1932, 81 y.o.   MRN: [id number]   Location:  [facility]  Code Status:  Allergies  Allergen Reactions  . Cephalosporins Rash  . Suprax [Cefixime] Rash    Chief Complaint  Patient presents with  . Medical Management of Chronic Issues    thyroid, blood pressure, Vit B12 deficiency  . [place]    going there for his thyroid, fatigue    HPI: Patient is a 81 y.o. seen in the office today for med mgt of chronic diseases.    Major problem remains lack of energy.  Runs table tennis clinic here.  Had tonsillar ca (dx oct 2013, went to [facility] for xrt and chemo and XRT wiped out his thyroid so he's been on supplement.  [facility] and they increased his thyroid med to 110mcg--has not made a difference.  Admits he does not sleep well either.  Was diagnosed with sleep apnea.  Spent a year with mask and machine but did not make difference.  At that time slept well, but woke up tired.  After trying that, he now falls asleep easily, but wakes up 3-4 hrs later.    TSH is now 0.191.    Last year, had hyperbaric therapy to restore blood flow to his jaw after the XRT was done.    Taking vitamin D 4000 units daily.  Was initially on high doses through his urologist.    Started table tennis at 81 yo.  Knows how long he used to be able to play and this has declined over the last couple of years.  Not dyspneic, but just tired.  Says even when he's sitting down, he suddenly becomes aware of his breathing.  Feels the breaths are not helpful.  Does not note any of this when playing.    Played tennis in his 33s.  Stopped b/c after 2 matches, he would be almost unable to stand.  When jogging in his 40s, his pulse would go up very high over 200.  Has not recently checked it after exercise over the last decade.  Discussed that his LDL is elevated, but he wants to know which LDL type it is.    Had high lead and mercury in  blood and cystatin c was checked due to this to assess liver.  Does admit to depression.  Had gleason 9 prostate cancer with all 12 samples 85% cancerous.  XRT saved his life--destroyed his sex life, dry throat despite it being years.  Doesn't taste things well.  Takes 3x longer to eat b/c saliva not helping.  Has to drink a lot of fluids to get his food down.  Energy down so table tennis down.  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  Otherwise happily married and pleased to be here and have a close knit and loving family.Thinks wellbutrin has helped some.  No longer feels completely blah on wakening.    Takes a lot of vitamin C.  Refuses flu shot.  Says he rarely gets sick.  Refuses prevnar.  Has had pneumovax per chart.    Review of Systems:  Review of Systems  Constitutional: Positive for malaise/fatigue.  HENT: Negative for congestion.   Eyes: Negative for blurred vision.  Respiratory: Negative for shortness of breath.   Cardiovascular: Negative for chest pain.  Gastrointestinal: Negative for abdominal pain.  Genitourinary:       ED  Musculoskeletal: Negative for falls.  Neurological: Negative for dizziness and weakness.  Psychiatric/Behavioral: Positive for depression. Negative for memory loss.     Past Medical History  Diagnosis Date  . Urethral stricture   . Urinary retention with incomplete bladder emptying   . History of prostate cancer DX  2002    S/P EXTERNAL RADIATION/ RADIOACTIVE SEED IMPLANTS  2003  . Prostate hypertrophy   . OSA (obstructive sleep apnea) intolerant cpap  . Self-catheterizes urinary bladder     QID   AND   PRN  . HTN (hypertension)   . Insomnia with sleep apnea   . Saliva decreased   . Mouth dryness   . S/P radiation therapy ended 01-26-2012       for tonsillar cancer  . 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 History of squamous cell carcinoma of skin   . Restless legs syndrome (RLS) 10/31/2011  . Pain in limb 08/08/2011  . Loss of weight 08/08/2011  . Nocturia 08/08/2011  . Other abnormal blood chemistry 01/31/2011  . Basal cell carcinoma of skin of other and unspecified parts of face 01/28/2011  . Squamous cell carcinoma of skin of other and unspecified parts of face 01/28/2011  . Vitamin B 12 deficiency 01/28/2011  . Actinic keratosis 01/28/2011  . Degeneration of intervertebral disc, site unspecified 01/28/2011  . Other malaise and fatigue 01/28/2011  . Malignant neoplasm of prostate 01/28/2000  . DDD (degenerative disc disease)   . Vitamin D deficiency   . Hyperlipemia     Past Surgical History  Procedure Laterality Date  . Radioactive seed implants, prostate  JAN  2003  . Cysto/ balloon dilation of  urethral stricture  12-25-2010  . Cystoscopy with urethral dilatation  05/31/2011    Procedure: CYSTOSCOPY WITH URETHRAL DILATATION;  Surgeon: Reece Packer, MD;  Location: [place];  Service: Urology;  Laterality: N/A;  BALLOON DILATION  . Cataract extraction w/ intraocular lens  implant, bilateral    . Cystoscopy/retrograde/ureteroscopy N/A 05/22/2012    Procedure: CYSTOSCOPY BALLOON DILATION RETROGRADE URETEROGRAM ;  Surgeon: Reece Packer, MD;  Location: [place];  Service: Urology;  Laterality: N/A;  . Inguinal hernia repair  2000    Right side  . Skin lesion excision  07/2012    MOST  right shoulder    Social History:   reports that he has never smoked. He has never used smokeless tobacco. He reports that he drinks about 0.6 oz of alcohol per week. He reports that he does not use illicit drugs.  History reviewed. No pertinent family history.  Medications: Patient's Medications  New Prescriptions   No medications on file  Previous Medications   ASCORBIC ACID (VITAMIN C PO)     Take 1 capsule by mouth every other day. 8000 MG   BROMELAIN 100 MG TABS    Take 150 mg by mouth 2 (two) times daily. Take 1 1/2 tablet twice daily.   BUPROPION (WELLBUTRIN) 100 MG TABLET    TAKE ONE TABLET BY MOUTH TWICE DAILY   CALCIUM CARBONATE 1500 MG TABS    Take 1 tablet by mouth daily.    CHOLECALCIFEROL (VITAMIN D3) 1000 UNITS CAPS    Take by mouth. Take 4,000 daily   CHONDROITIN SULFATE A 150 MG CAPS  Take by mouth.   COENZYME Q10 (COQ10) 100 MG CAPS    Take 100 mg by mouth daily. Take one daily   COLLAGEN PO    Take 600 mg by mouth 2 (two) times daily. Take 600 mg twice daily.   COLOSTRUM PO    Take by mouth. One teaspoon daily 1 oz of water   DIGESTIVE ENZYMES PO    Take 500 mg by mouth daily. Take 500 mg twice daily   FISH OIL-OMEGA-3 FATTY ACIDS 1000 MG CAPSULE    Take 2 g by mouth daily.   GLUCOSAMINE-CHONDROITIN-MSM PO    Take 500 mg by mouth 2 (two) times daily. Take 500 mg twice daily.   HYALURONIC ACID-VITAMIN C (HYALURONIC ACID PO)    Take 100 mg by mouth 2 (two) times daily. 100mg  twice a day   LEVOTHYROXINE (SYNTHROID, LEVOTHROID) 100 MCG TABLET    One daily for thyroid supplement   LISINOPRIL (PRINIVIL,ZESTRIL) 10 MG TABLET    Take 1 tablet (10 mg total) by mouth daily.   METHYLCOBALAMIN 1 MG CHEW    One dally for B12 supplement   MULTIPLE VITAMINS-MINERALS (MULTIVITAMINS THER. W/MINERALS) TABS    Take 2 tablets by mouth 2 (two) times daily.    QUERCETIN DIHYDRATE POWD    Take by mouth.   QUERCETIN PO    Take 250 mg by mouth daily. Take 500 mg daily   RESVERATROL PO    Take 100 mg by mouth 2 (two) times daily. Take 2 100 mg tablets twice daily.   SILDENAFIL (REVATIO) 20 MG TABLET    Take 1-3 tablets prior to intercourse   TURMERIC, CURCUMA LONGA, (CURCUMIN EXTRACT) POWD    Take by mouth.   VITAMIN E 400 UNIT CAPSULE    Take 400 Units by mouth daily. Take 1 tablet daily for vitamin e supplement.  Modified Medications   No medications on file  Discontinued Medications     LISINOPRIL (PRINIVIL,ZESTRIL) 10 MG TABLET    TAKE ONE TABLET BY MOUTH ONCE DAILY     Physical Exam: Filed Vitals:   03/04/14 1412  BP: 120/60  Pulse: 80  Temp: 97.5 F (36.4 C)  TempSrc: Oral  Weight: 156 lb (70.761 kg)  Physical Exam  Constitutional: He is oriented to person, place, and time. He appears well-developed and well-nourished. No distress.  Thin white male  Cardiovascular: Normal rate, regular rhythm, normal heart sounds and intact distal pulses.   Pulmonary/Chest: Effort normal and breath sounds normal. No respiratory distress.  Abdominal: Soft. Bowel sounds are normal.  Musculoskeletal:  Ambulatory independently  Neurological: He is alert and oriented to person, place, and time.  Skin: Skin is warm and dry.    Labs reviewed: Basic Metabolic Panel:  Recent Labs  03/28/13 10/29/13 02/27/14  NA  --  142  --   K  --  4.2  --   BUN  --  20  --   CREATININE  --  1.1  --   TSH 7.94* 3.44 0.19*   Liver Function Tests:  Recent Labs  10/29/13  AST 15  ALT 11  ALKPHOS 47  Lipid Panel:  Recent Labs  10/29/13 02/27/14  CHOL 288* 221*  HDL 63 61  LDLCALC 200 162  TRIG 106 77    Assessment/Plan 1. Chronic fatigue -etiology unclear aside from aging process, does not sleep through the night and discussed that this can be normal to wake up earlier in the am with age -advised he try  checking his pulse after playing table tennis as he notes that he gets fatigued more quickly than he once did and also mentions having a higher than expected pulse when younger during exertion -could also be toxic from one of the many other supplements he takes and we would not know which one  2. Radiation adverse effect, subsequent encounter -has had difficulty with ED due to radiation for his prostate cancer and also difficulty with dry mouth and loss of taste since tonsillar ca -these problems make him depressed  3. Malignant neoplasm of tonsil -previously had xrt for this  with side effects above  4. Hypothyroidism due to acquired atrophy of thyroid -TSH low suggesting taking the synthroid 12mcg as advised by the [facility] is too much but he does not want to adjust his dose based on that alone--wants other markers measured by them, so advised to f/u with them about it  5. Depression -cont wellbutrin which he thinks may have been helpful  6. Hyperlipidemia -does not want to take medications for this based on direct LDL alone--cont diet and exercise  Labs/tests ordered:  No new--to bring copies of labs from [facility] office Next appt:  6 mos  Karah Caruthers L. Stepfanie Yott, D.O. [facility] [address], [place] [address] Cell Phone (Mon-Fri 8am-5pm):  [phone] On Call:  [phone] & follow prompts after 5pm & weekends Office Phone:  [phone] Office Fax:  [phone]

## 2014-03-11 ENCOUNTER — Encounter: Payer: Self-pay | Admitting: Internal Medicine

## 2014-04-02 ENCOUNTER — Encounter: Payer: Self-pay | Admitting: Nurse Practitioner

## 2014-04-02 ENCOUNTER — Non-Acute Institutional Stay: Payer: Medicare Other | Admitting: Nurse Practitioner

## 2014-04-02 VITALS — BP 110/60 | HR 69

## 2014-04-02 DIAGNOSIS — Z85828 Personal history of other malignant neoplasm of skin: Secondary | ICD-10-CM | POA: Diagnosis not present

## 2014-04-02 DIAGNOSIS — D485 Neoplasm of uncertain behavior of skin: Secondary | ICD-10-CM | POA: Diagnosis not present

## 2014-04-02 DIAGNOSIS — Z418 Encounter for other procedures for purposes other than remedying health state: Secondary | ICD-10-CM

## 2014-04-02 DIAGNOSIS — L819 Disorder of pigmentation, unspecified: Secondary | ICD-10-CM | POA: Diagnosis not present

## 2014-04-02 DIAGNOSIS — Z7189 Other specified counseling: Secondary | ICD-10-CM

## 2014-04-02 DIAGNOSIS — L570 Actinic keratosis: Secondary | ICD-10-CM | POA: Diagnosis not present

## 2014-04-02 DIAGNOSIS — Z299 Encounter for prophylactic measures, unspecified: Secondary | ICD-10-CM

## 2014-04-02 NOTE — Progress Notes (Signed)
Patient ID: Philip [name], male   DOB: [birth year], 81 y.o.   MRN: [id number]    Nursing Home Location:  [facility] (12)  PCP: REED, TIFFANY, DO  Allergies  Allergen Reactions  . Cephalosporins Rash  . Suprax [Cefixime] Rash    Chief Complaint  Patient presents with  . Medical Management of Chronic Issues    to discuss DNR forms    HPI:  Patient is a 81 y.o. male seen today at [facility] to discuss DNR forms. Philip [name] has a medical history of hypertension, OSA, malignant neoplasm of tonsil, and prostate cancer. He has questions about what it means to be a DNR.    Review of Systems:  Review of Systems  Constitutional: Negative for fever, chills and fatigue.  HENT: Negative.   Respiratory: Negative for cough, shortness of breath and wheezing.   Cardiovascular: Negative for chest pain, palpitations and leg swelling.  Gastrointestinal: Negative.   Genitourinary: Negative.   Musculoskeletal: Negative for myalgias, back pain and arthralgias.  Skin: Negative for color change, pallor, rash and wound.  Neurological: Negative.   Psychiatric/Behavioral: Negative.     Past Medical History  Diagnosis Date  . Urethral stricture   . Urinary retention with incomplete bladder emptying   . History of prostate cancer DX  2002    S/P EXTERNAL RADIATION/ RADIOACTIVE SEED IMPLANTS  2003  . Prostate hypertrophy   . OSA (obstructive sleep apnea) intolerant cpap  . Self-catheterizes urinary bladder     QID   AND   PRN  . HTN (hypertension)   . Insomnia with sleep apnea   . Saliva decreased   . Mouth dryness   . S/P radiation therapy ended 01-26-2012       for tonsillar cancer  . 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  History of squamous cell carcinoma of skin   . Restless legs syndrome (RLS) 10/31/2011  . Pain in limb 08/08/2011  . Loss of weight 08/08/2011  . Nocturia 08/08/2011  . Other abnormal blood chemistry 01/31/2011  . Basal cell carcinoma of skin of other and unspecified parts of face 01/28/2011  . Squamous cell carcinoma of skin of other and unspecified parts of face 01/28/2011  . Vitamin B 12 deficiency 01/28/2011  . Actinic keratosis 01/28/2011  . Degeneration of intervertebral disc, site unspecified 01/28/2011  . Other malaise and fatigue 01/28/2011  . Malignant neoplasm of prostate 01/28/2000  . DDD (degenerative disc disease)   . Vitamin D deficiency   . Hyperlipemia    Past Surgical History  Procedure Laterality Date  . Radioactive seed implants, prostate  JAN  2003  . Cysto/ balloon dilation of  urethral stricture  12-25-2010  . Cystoscopy with urethral dilatation  05/31/2011    Procedure: CYSTOSCOPY WITH URETHRAL DILATATION;  Surgeon: Reece Packer, MD;  Location: [place];  Service: Urology;  Laterality: N/A;  BALLOON DILATION  . Cataract extraction w/ intraocular lens  implant, bilateral    . Cystoscopy/retrograde/ureteroscopy N/A 05/22/2012    Procedure: CYSTOSCOPY BALLOON DILATION RETROGRADE URETEROGRAM ;  Surgeon: Reece Packer, MD;  Location: [place];  Service: Urology;  Laterality: N/A;  . Inguinal hernia repair  2000    Right side  .  Skin lesion excision  07/2012    MOST  right shoulder   Social History:   reports that he has never smoked. He has never used smokeless tobacco. He reports that he drinks about 0.6 oz of alcohol per week. He reports that he does not use illicit drugs.  History reviewed. No pertinent family history.  Medications: Patient's Medications  New Prescriptions   No medications on file  Previous Medications   ASCORBIC ACID (VITAMIN C PO)    Take 1 capsule by mouth every other day. 8000 MG   BROMELAIN 100 MG  TABS    Take 150 mg by mouth 2 (two) times daily. Take 1 1/2 tablet twice daily.   BUPROPION (WELLBUTRIN) 100 MG TABLET    TAKE ONE TABLET BY MOUTH TWICE DAILY   CALCIUM CARBONATE 1500 MG TABS    Take 1 tablet by mouth daily.    CHOLECALCIFEROL (VITAMIN D3) 1000 UNITS CAPS    Take by mouth. Take 4,000 daily   CHONDROITIN SULFATE A 150 MG CAPS    Take by mouth.   COENZYME Q10 (COQ10) 100 MG CAPS    Take 100 mg by mouth daily. Take one daily   COLLAGEN PO    Take 600 mg by mouth 2 (two) times daily. Take 600 mg twice daily.   COLOSTRUM PO    Take by mouth. One teaspoon daily 1 oz of water   DIGESTIVE ENZYMES PO    Take 500 mg by mouth daily. Take 500 mg twice daily   FISH OIL-OMEGA-3 FATTY ACIDS 1000 MG CAPSULE    Take 2 g by mouth daily.   GLUCOSAMINE-CHONDROITIN-MSM PO    Take 500 mg by mouth 2 (two) times daily. Take 500 mg twice daily.   HYALURONIC ACID-VITAMIN C (HYALURONIC ACID PO)    Take 100 mg by mouth 2 (two) times daily. 100mg  twice a day   LEVOTHYROXINE (SYNTHROID, LEVOTHROID) 100 MCG TABLET    One daily for thyroid supplement   LISINOPRIL (PRINIVIL,ZESTRIL) 10 MG TABLET    Take 1 tablet (10 mg total) by mouth daily.   METHYLCOBALAMIN 1 MG CHEW    One dally for B12 supplement   MULTIPLE VITAMINS-MINERALS (MULTIVITAMINS THER. W/MINERALS) TABS    Take 2 tablets by mouth 2 (two) times daily.    QUERCETIN DIHYDRATE POWD    Take by mouth.   QUERCETIN PO    Take 250 mg by mouth daily. Take 500 mg daily   RESVERATROL PO    Take 100 mg by mouth 2 (two) times daily. Take 2 100 mg tablets twice daily.   SILDENAFIL (REVATIO) 20 MG TABLET    Take 1-3 tablets prior to intercourse   TURMERIC, CURCUMA LONGA, (CURCUMIN EXTRACT) POWD    Take by mouth.   VITAMIN E 400 UNIT CAPSULE    Take 400 Units by mouth daily. Take 1 tablet daily for vitamin e supplement.  Modified Medications   No medications on file  Discontinued Medications   No medications on file     Physical Exam: Filed Vitals:    04/02/14 1320  BP: 110/60  Pulse: 69  SpO2: 99%    Physical Exam  Constitutional: He is oriented to person, place, and time. He appears well-developed and well-nourished. No distress.  HENT:  Head: Normocephalic and atraumatic.  Eyes: Pupils are equal, round, and reactive to light.  Neck: Normal range of motion. Neck supple.  Cardiovascular: Normal rate, regular rhythm and normal heart sounds.   No murmur  heard. Pulmonary/Chest: Effort normal and breath sounds normal.  Abdominal: Soft. Bowel sounds are normal.  Musculoskeletal: Normal range of motion.  Neurological: He is alert and oriented to person, place, and time.  Skin: Skin is warm and dry.  Psychiatric: He has a normal mood and affect.    Labs reviewed: Basic Metabolic Panel:  Recent Labs  10/29/13  NA 142  K 4.2  BUN 20  CREATININE 1.1   Liver Function Tests:  Recent Labs  10/29/13  AST 15  ALT 11  ALKPHOS 47   No results for input(s): LIPASE, AMYLASE in the last 8760 hours. No results for input(s): AMMONIA in the last 8760 hours. CBC: No results for input(s): WBC, NEUTROABS, HGB, HCT, MCV, PLT in the last 8760 hours. TSH:  Recent Labs  10/29/13 02/27/14  TSH 3.44 0.19*   A1C: No results found for: HGBA1C Lipid Panel:  Recent Labs  10/29/13 02/27/14  CHOL 288* 221*  HDL 63 61  LDLCALC 200 162  TRIG 106 77      Assessment/Plan  1. Preventive measure - DNR (Do Not Resuscitate)  2. Advanced care planning/counseling discussion conversation regarding code status, discussed forms, pt does not want to be resuscitated in the community, DNR form signed. Returned to patient.

## 2014-04-03 ENCOUNTER — Other Ambulatory Visit: Payer: Self-pay | Admitting: Internal Medicine

## 2014-04-25 DIAGNOSIS — Z961 Presence of intraocular lens: Secondary | ICD-10-CM | POA: Diagnosis not present

## 2014-04-25 DIAGNOSIS — H532 Diplopia: Secondary | ICD-10-CM | POA: Diagnosis not present

## 2014-04-25 DIAGNOSIS — H3531 Nonexudative age-related macular degeneration: Secondary | ICD-10-CM | POA: Diagnosis not present

## 2014-05-01 ENCOUNTER — Encounter: Payer: Self-pay | Admitting: Internal Medicine

## 2014-05-08 DIAGNOSIS — M9903 Segmental and somatic dysfunction of lumbar region: Secondary | ICD-10-CM | POA: Diagnosis not present

## 2014-05-08 DIAGNOSIS — M13112 Monoarthritis, not elsewhere classified, left shoulder: Secondary | ICD-10-CM | POA: Diagnosis not present

## 2014-05-08 DIAGNOSIS — M545 Low back pain: Secondary | ICD-10-CM | POA: Diagnosis not present

## 2014-05-12 ENCOUNTER — Other Ambulatory Visit: Payer: Self-pay | Admitting: Internal Medicine

## 2014-05-12 DIAGNOSIS — M9903 Segmental and somatic dysfunction of lumbar region: Secondary | ICD-10-CM | POA: Diagnosis not present

## 2014-05-12 DIAGNOSIS — M13112 Monoarthritis, not elsewhere classified, left shoulder: Secondary | ICD-10-CM | POA: Diagnosis not present

## 2014-05-12 DIAGNOSIS — M545 Low back pain: Secondary | ICD-10-CM | POA: Diagnosis not present

## 2014-05-14 ENCOUNTER — Telehealth: Payer: Self-pay

## 2014-05-14 MED ORDER — CIPROFLOXACIN HCL 250 MG PO TABS: ORAL_TABLET | ORAL | Status: DC

## 2014-05-14 NOTE — Telephone Encounter (Signed)
Called patient to let him know that Dr. Nyoka Cowden said we can fax his Cipro to [facility].

## 2014-05-14 NOTE — Telephone Encounter (Signed)
Patient has to self cath, occasionally gets UTI. He is leaving to go to [place] and he would like to take some Cipro with him incase he gets UTI there. Dr. Nyoka Cowden has given him Cipro 250mg  #10 one twice daily in the past. [facility]. Routed to Dr. Nyoka Cowden.

## 2014-05-14 NOTE — Telephone Encounter (Signed)
Fill prescription for Cipro as requested.

## 2014-06-02 DIAGNOSIS — M9905 Segmental and somatic dysfunction of pelvic region: Secondary | ICD-10-CM | POA: Diagnosis not present

## 2014-06-02 DIAGNOSIS — M545 Low back pain: Secondary | ICD-10-CM | POA: Diagnosis not present

## 2014-06-17 ENCOUNTER — Telehealth: Payer: Self-pay

## 2014-06-17 NOTE — Telephone Encounter (Signed)
Patient called indicating he is experiencing sleep pattern concerns. Patient wakes up about 2-3 am and stays up. Due to a lack of sleep patient is very fatigued. Patient questions if he needs an evaluation for sleep apnea. Patient was evaluated for sleep apnea in November 2010, given CPAP machine and it did nothing for him. Patient not sure if he needs to reconsider CPAP as an option or what the root cause of his sleep disturbance is.  Patient was scheduled to see NP tomorrow at 8:30 am. Patient was offered several times and requested the earliest available at 8:30.

## 2014-06-18 ENCOUNTER — Non-Acute Institutional Stay: Payer: Medicare Other | Admitting: Nurse Practitioner

## 2014-06-18 ENCOUNTER — Encounter: Payer: Self-pay | Admitting: Nurse Practitioner

## 2014-06-18 VITALS — BP 114/62 | HR 63 | Temp 97.8°F | Wt 156.0 lb

## 2014-06-18 DIAGNOSIS — G4733 Obstructive sleep apnea (adult) (pediatric): Secondary | ICD-10-CM | POA: Diagnosis not present

## 2014-06-18 DIAGNOSIS — Z8546 Personal history of malignant neoplasm of prostate: Secondary | ICD-10-CM | POA: Diagnosis not present

## 2014-06-18 DIAGNOSIS — G2581 Restless legs syndrome: Secondary | ICD-10-CM | POA: Diagnosis not present

## 2014-06-18 DIAGNOSIS — F329 Major depressive disorder, single episode, unspecified: Secondary | ICD-10-CM

## 2014-06-18 DIAGNOSIS — G4700 Insomnia, unspecified: Secondary | ICD-10-CM | POA: Diagnosis not present

## 2014-06-18 DIAGNOSIS — F32A Depression, unspecified: Secondary | ICD-10-CM

## 2014-06-18 NOTE — Progress Notes (Signed)
Patient ID: Philip [name], male   DOB: 09/14/1932, 81 y.o.   MRN: [id number]    Nursing Home Location:  [facility] (12)  PCP: REED, TIFFANY, DO  Allergies  Allergen Reactions  . Cephalosporins Rash  . Suprax [Cefixime] Rash    Chief Complaint  Patient presents with  . Sleeping Problem    experiencing sleep pattern concerns. Wakes up about 2-3 am and stays awake. Due to lack of sleep patient very fatigued. Patient questions if he needs evaluate for sleep apnea, was evaluated for sleep apnea in 11/2008 and given C-Pap machine, did nothing for him. Is C-Pap option or what the root cause for sleep disturbance is    HPI:  Patient is a 81 y.o. male seen today at [facility] due to insomnia. Pt with hx of OSA, insomnia,  Nov 2010 dx with OSA and RLS.  Pt also with hx of throat cancer, hypothroidism. Would sleep through the night and in the morning would still feel tired.  Now his sleep habits have changed- he will fall asleep but then wake up at 2-3 and then not be able to fall asleep. In the last few months things have gotten worse.  Goes to bed at 930-10. Wake up around 2-3 falls back asleep around 6 or lays there until he has to get up.  Occasionally will nap during the day 1 hour around 4 pm Drinks coffee in the morning, no other caffeine  Was playing table tennis 3 times a week 1 or so, now he can barely play 30 mins.  Hx of hypothyroidism, following with integrative medicine in [place]. Last seen a few months ago.  No anxiety, reports depression but does not feel like this is overwhelming, reports it it due to chemical imbalance he has nothing to be depressed about.  Taking mag citrate for Restless leg which he feels like is helping his leg movement, not helping sleep.  Legs get very weak and achy easily- still walking to aquatic center   Review of Systems:  Review of Systems  Constitutional: Negative for fever,  chills and fatigue.  HENT: Negative.   Respiratory: Negative for cough, shortness of breath and wheezing.   Cardiovascular: Negative for chest pain, palpitations and leg swelling.  Gastrointestinal: Negative.   Genitourinary: Negative.   Musculoskeletal: Negative for myalgias, back pain and arthralgias.  Skin: Negative for color change, pallor, rash and wound.  Neurological: Negative.   Psychiatric/Behavioral: Positive for sleep disturbance.    Past Medical History  Diagnosis Date  . Urethral stricture   . Urinary retention with incomplete bladder emptying   . History of prostate cancer DX  2002    S/P EXTERNAL RADIATION/ RADIOACTIVE SEED IMPLANTS  2003  . Prostate hypertrophy   . OSA (obstructive sleep apnea) intolerant cpap  . Self-catheterizes urinary bladder     QID   AND   PRN  . HTN (hypertension)   . Insomnia with sleep apnea   . Saliva decreased   . Mouth dryness   . S/P radiation therapy ended 01-26-2012       for tonsillar cancer  . 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 History of squamous cell carcinoma of skin   . Restless legs syndrome (RLS)  10/31/2011  . Pain in limb 08/08/2011  . Loss of weight 08/08/2011  . Nocturia 08/08/2011  . Other abnormal blood chemistry 01/31/2011  . Basal cell carcinoma of skin of other and unspecified parts of face 01/28/2011  . Squamous cell carcinoma of skin of other and unspecified parts of face 01/28/2011  . Vitamin B 12 deficiency 01/28/2011  . Actinic keratosis 01/28/2011  . Degeneration of intervertebral disc, site unspecified 01/28/2011  . Other malaise and fatigue 01/28/2011  . Malignant neoplasm of prostate 01/28/2000  . DDD (degenerative disc disease)   . Vitamin D deficiency   . Hyperlipemia    Past Surgical History  Procedure  Laterality Date  . Radioactive seed implants, prostate  JAN  2003  . Cysto/ balloon dilation of  urethral stricture  12-25-2010  . Cystoscopy with urethral dilatation  05/31/2011    Procedure: CYSTOSCOPY WITH URETHRAL DILATATION;  Surgeon: Reece Packer, MD;  Location: [place];  Service: Urology;  Laterality: N/A;  BALLOON DILATION  . Cataract extraction w/ intraocular lens  implant, bilateral    . Cystoscopy/retrograde/ureteroscopy N/A 05/22/2012    Procedure: CYSTOSCOPY BALLOON DILATION RETROGRADE URETEROGRAM ;  Surgeon: Reece Packer, MD;  Location: [facility];  Service: Urology;  Laterality: N/A;  . Inguinal hernia repair  2000    Right side  . Skin lesion excision  07/2012    MOST  right shoulder   Social History:   reports that he has never smoked. He has never used smokeless tobacco. He reports that he drinks about 0.6 oz of alcohol per week. He reports that he does not use illicit drugs.  History reviewed. No pertinent family history.  Medications: Patient's Medications  New Prescriptions   No medications on file  Previous Medications   ASCORBIC ACID (VITAMIN C PO)    Take 1 capsule by mouth every other day. 8000 MG   BROMELAIN 100 MG TABS    Take 150 mg by mouth 2 (two) times daily. Take 1 1/2 tablet twice daily.   BUPROPION (WELLBUTRIN) 100 MG TABLET    TAKE ONE TABLET BY MOUTH TWICE DAILY.   CALCIUM CARBONATE 1500 MG TABS    Take 1 tablet by mouth daily.    CHOLECALCIFEROL (VITAMIN D3) 1000 UNITS CAPS    Take by mouth. Take 4,000 daily   CHONDROITIN SULFATE A 150 MG CAPS    Take by mouth.   COENZYME Q10 (COQ10) 100 MG CAPS    Take 100 mg by mouth daily. Take one daily   COLLAGEN PO    Take 600 mg by mouth 2 (two) times daily. Take 600 mg twice daily.   COLOSTRUM PO    Take by mouth. One teaspoon daily 1 oz of water   DIGESTIVE ENZYMES PO    Take 500 mg by mouth daily. Take 500 mg twice daily   FISH OIL-OMEGA-3 FATTY ACIDS 1000 MG  CAPSULE    Take 2 g by mouth daily.   GLUCOSAMINE-CHONDROITIN-MSM PO    Take 500 mg by mouth 2 (two) times daily. Take 500 mg twice daily.   HYALURONIC ACID-VITAMIN C (HYALURONIC ACID PO)    Take 100 mg by mouth 2 (two) times daily. 100mg  twice a day   LISINOPRIL (PRINIVIL,ZESTRIL) 10 MG TABLET    Take 1 tablet (10 mg total) by mouth daily.   METHYLCOBALAMIN 1 MG CHEW    One dally for B12 supplement   MULTIPLE VITAMINS-MINERALS (MULTIVITAMINS THER. W/MINERALS) TABS  Take 2 tablets by mouth 2 (two) times daily.    QUERCETIN PO    Take 250 mg by mouth daily. Take 500 mg daily   RESVERATROL PO    Take 100 mg by mouth 2 (two) times daily. Take 2 100 mg tablets twice daily.   THYROID (ARMOUR) 130 MG TABLET    Take 130 mg by mouth. Take one tablet for thyroid supplement   TURMERIC, CURCUMA LONGA, (CURCUMIN EXTRACT) POWD    Take by mouth.   VITAMIN E 400 UNIT CAPSULE    Take 400 Units by mouth daily. Take 1 tablet daily for vitamin e supplement.  Modified Medications   No medications on file  Discontinued Medications   CIPROFLOXACIN (CIPRO) 250 MG TABLET    Take one tablet twice daily for UTI   LEVOTHYROXINE (SYNTHROID, LEVOTHROID) 100 MCG TABLET    One daily for thyroid supplement   QUERCETIN DIHYDRATE POWD    Take by mouth.   SILDENAFIL (REVATIO) 20 MG TABLET    Take 1-3 tablets prior to intercourse     Physical Exam: Filed Vitals:   06/18/14 0912  BP: 114/62  Pulse: 63  Temp: 97.8 F (36.6 C)  TempSrc: Oral  Weight: 156 lb (70.761 kg)  SpO2: 99%    Physical Exam  Constitutional: He is oriented to person, place, and time. He appears well-developed and well-nourished. No distress.  HENT:  Head: Normocephalic and atraumatic.  Eyes: Pupils are equal, round, and reactive to light.  Neck: Normal range of motion. Neck supple.  Cardiovascular: Normal rate, regular rhythm and normal heart sounds.   No murmur heard. Pulmonary/Chest: Effort normal and breath sounds normal.  Abdominal:  Soft. Bowel sounds are normal.  Musculoskeletal: Normal range of motion.  Neurological: He is alert and oriented to person, place, and time.  Skin: Skin is warm and dry.  Psychiatric: He has a normal mood and affect.    Labs reviewed: Basic Metabolic Panel:  Recent Labs  10/29/13  NA 142  K 4.2  BUN 20  CREATININE 1.1   Liver Function Tests:  Recent Labs  10/29/13  AST 15  ALT 11  ALKPHOS 47   No results for input(s): LIPASE, AMYLASE in the last 8760 hours. No results for input(s): AMMONIA in the last 8760 hours. CBC: No results for input(s): WBC, NEUTROABS, HGB, HCT, MCV, PLT in the last 8760 hours. TSH:  Recent Labs  10/29/13 02/27/14  TSH 3.44 0.19*   A1C: No results found for: HGBA1C Lipid Panel:  Recent Labs  10/29/13 02/27/14  CHOL 288* 221*  HDL 63 61  LDLCALC 200 162  TRIG 106 77      Assessment/Plan   1. OSA (obstructive sleep apnea) -did not tolerate cpap in the past, will refer back to pulmonary for further evaluation   2. Insomnia -main issues of getting up at night at 2-3 and unable to go back to sleep -discussed going to bed later, not napping so late and only napping 30 mins  -discussed depression could play a role in insomnia and could see therapist regarding this.  -could be related to RLS, does not wish to take requip right now but will let us know if currently supplement not effective.   3. Depression -reports this is controlled, conts on Wellbutrin twice daily   4. History of prostate cancer -was told my MD to take vit d 5000 units daily after dx of prostate cancer, vit d level checked in feb 2016 at 58.  5. RLS  -conts on mag supplement which he feels has been effective. Will monitor this and discussed added requip if needed  Keep follow up with Dr Mariea Clonts

## 2014-06-24 DIAGNOSIS — R5383 Other fatigue: Secondary | ICD-10-CM | POA: Diagnosis not present

## 2014-06-24 DIAGNOSIS — I10 Essential (primary) hypertension: Secondary | ICD-10-CM | POA: Diagnosis not present

## 2014-06-24 DIAGNOSIS — E039 Hypothyroidism, unspecified: Secondary | ICD-10-CM | POA: Diagnosis not present

## 2014-06-24 DIAGNOSIS — G4733 Obstructive sleep apnea (adult) (pediatric): Secondary | ICD-10-CM | POA: Diagnosis not present

## 2014-06-24 LAB — HEPATIC FUNCTION PANEL
ALT: 12 U/L (ref 10–40)
AST: 12 U/L — AB (ref 14–40)
Alkaline Phosphatase: 62 U/L (ref 25–125)
Bilirubin, Total: 0.5 mg/dL

## 2014-06-24 LAB — BASIC METABOLIC PANEL
BUN: 31 mg/dL — AB (ref 4–21)
Creatinine: 1.5 mg/dL — AB (ref 0.6–1.3)
Glucose: 97 mg/dL
Potassium: 4.8 mmol/L (ref 3.4–5.3)
SODIUM: 140 mmol/L (ref 137–147)

## 2014-06-24 LAB — CBC AND DIFFERENTIAL
HCT: 36 % — AB (ref 41–53)
HEMOGLOBIN: 12.5 g/dL — AB (ref 13.5–17.5)
Platelets: 234 10*3/uL (ref 150–399)
WBC: 6.3 10*3/mL

## 2014-06-24 LAB — TSH: TSH: 0.1 u[IU]/mL — AB (ref 0.41–5.90)

## 2014-06-30 ENCOUNTER — Other Ambulatory Visit: Payer: Self-pay | Admitting: Nurse Practitioner

## 2014-07-01 ENCOUNTER — Telehealth: Payer: Self-pay

## 2014-07-01 ENCOUNTER — Encounter (HOSPITAL_COMMUNITY): Payer: Self-pay | Admitting: *Deleted

## 2014-07-01 ENCOUNTER — Emergency Department (HOSPITAL_COMMUNITY)
Admission: EM | Admit: 2014-07-01 | Discharge: 2014-07-02 | Disposition: A | Discharge status: Home / Self Care | Payer: Medicare Other | Attending: Emergency Medicine | Admitting: Emergency Medicine

## 2014-07-01 DIAGNOSIS — Z872 Personal history of diseases of the skin and subcutaneous tissue: Secondary | ICD-10-CM | POA: Insufficient documentation

## 2014-07-01 DIAGNOSIS — Z8546 Personal history of malignant neoplasm of prostate: Secondary | ICD-10-CM | POA: Insufficient documentation

## 2014-07-01 DIAGNOSIS — Z79899 Other long term (current) drug therapy: Secondary | ICD-10-CM | POA: Insufficient documentation

## 2014-07-01 DIAGNOSIS — Z923 Personal history of irradiation: Secondary | ICD-10-CM | POA: Diagnosis not present

## 2014-07-01 DIAGNOSIS — Z87442 Personal history of urinary calculi: Secondary | ICD-10-CM | POA: Insufficient documentation

## 2014-07-01 DIAGNOSIS — N390 Urinary tract infection, site not specified: Secondary | ICD-10-CM | POA: Insufficient documentation

## 2014-07-01 DIAGNOSIS — Z85828 Personal history of other malignant neoplasm of skin: Secondary | ICD-10-CM | POA: Insufficient documentation

## 2014-07-01 DIAGNOSIS — I10 Essential (primary) hypertension: Secondary | ICD-10-CM | POA: Insufficient documentation

## 2014-07-01 DIAGNOSIS — E785 Hyperlipidemia, unspecified: Secondary | ICD-10-CM | POA: Insufficient documentation

## 2014-07-01 DIAGNOSIS — R05 Cough: Secondary | ICD-10-CM | POA: Diagnosis not present

## 2014-07-01 DIAGNOSIS — Z08 Encounter for follow-up examination after completed treatment for malignant neoplasm: Secondary | ICD-10-CM | POA: Diagnosis not present

## 2014-07-01 DIAGNOSIS — Z85818 Personal history of malignant neoplasm of other sites of lip, oral cavity, and pharynx: Secondary | ICD-10-CM | POA: Diagnosis not present

## 2014-07-01 DIAGNOSIS — K5900 Constipation, unspecified: Secondary | ICD-10-CM | POA: Diagnosis not present

## 2014-07-01 DIAGNOSIS — E538 Deficiency of other specified B group vitamins: Secondary | ICD-10-CM | POA: Insufficient documentation

## 2014-07-01 DIAGNOSIS — E559 Vitamin D deficiency, unspecified: Secondary | ICD-10-CM | POA: Insufficient documentation

## 2014-07-01 DIAGNOSIS — E039 Hypothyroidism, unspecified: Secondary | ICD-10-CM | POA: Diagnosis not present

## 2014-07-01 DIAGNOSIS — R110 Nausea: Secondary | ICD-10-CM | POA: Diagnosis not present

## 2014-07-01 DIAGNOSIS — R1032 Left lower quadrant pain: Secondary | ICD-10-CM | POA: Diagnosis present

## 2014-07-01 DIAGNOSIS — K2970 Gastritis, unspecified, without bleeding: Secondary | ICD-10-CM | POA: Diagnosis not present

## 2014-07-01 DIAGNOSIS — Z8669 Personal history of other diseases of the nervous system and sense organs: Secondary | ICD-10-CM | POA: Diagnosis not present

## 2014-07-01 LAB — CBC WITH DIFFERENTIAL/PLATELET
BASOS PCT: 0 % (ref 0–1)
Basophils Absolute: 0 10*3/uL (ref 0.0–0.1)
EOS PCT: 0 % (ref 0–5)
Eosinophils Absolute: 0 10*3/uL (ref 0.0–0.7)
HEMATOCRIT: 37 % — AB (ref 39.0–52.0)
HEMOGLOBIN: 12.7 g/dL — AB (ref 13.0–17.0)
LYMPHS PCT: 11 % — AB (ref 12–46)
Lymphs Abs: 1 10*3/uL (ref 0.7–4.0)
MCH: 31.1 pg (ref 26.0–34.0)
MCHC: 34.3 g/dL (ref 30.0–36.0)
MCV: 90.7 fL (ref 78.0–100.0)
MONO ABS: 0.8 10*3/uL (ref 0.1–1.0)
MONOS PCT: 8 % (ref 3–12)
NEUTROS ABS: 8 10*3/uL — AB (ref 1.7–7.7)
Neutrophils Relative %: 81 % — ABNORMAL HIGH (ref 43–77)
Platelets: 196 10*3/uL (ref 150–400)
RBC: 4.08 MIL/uL — ABNORMAL LOW (ref 4.22–5.81)
RDW: 13.2 % (ref 11.5–15.5)
WBC: 9.8 10*3/uL (ref 4.0–10.5)

## 2014-07-01 LAB — COMPREHENSIVE METABOLIC PANEL
ALT: 14 U/L — AB (ref 17–63)
ANION GAP: 8 (ref 5–15)
AST: 17 U/L (ref 15–41)
Albumin: 3.5 g/dL (ref 3.5–5.0)
Alkaline Phosphatase: 69 U/L (ref 38–126)
BUN: 26 mg/dL — AB (ref 6–20)
CALCIUM: 8.4 mg/dL — AB (ref 8.9–10.3)
CO2: 22 mmol/L (ref 22–32)
CREATININE: 1.59 mg/dL — AB (ref 0.61–1.24)
Chloride: 105 mmol/L (ref 101–111)
GFR, EST AFRICAN AMERICAN: 46 mL/min — AB (ref >60)
GFR, EST NON AFRICAN AMERICAN: 39 mL/min — AB (ref >60)
Glucose, Bld: 120 mg/dL — ABNORMAL HIGH (ref 65–99)
Potassium: 4.1 mmol/L (ref 3.5–5.1)
SODIUM: 135 mmol/L (ref 135–145)
TOTAL PROTEIN: 7.3 g/dL (ref 6.5–8.1)
Total Bilirubin: 1 mg/dL (ref 0.3–1.2)

## 2014-07-01 LAB — LIPASE, BLOOD: Lipase: 27 U/L (ref 22–51)

## 2014-07-01 MED ORDER — ONDANSETRON HCL 4 MG/2ML IJ SOLN
4.0000 mg | Freq: Once | INTRAMUSCULAR | Status: AC
  Administered 2014-07-01: 4 mg via INTRAVENOUS
  Filled 2014-07-01: qty 2

## 2014-07-01 MED ORDER — SODIUM CHLORIDE 0.9 % IV SOLN
1000.0000 mL | INTRAVENOUS | Status: DC
  Administered 2014-07-01: 1000 mL via INTRAVENOUS

## 2014-07-01 NOTE — ED Notes (Signed)
Bed: WA20 Expected date: 07/01/14 Expected time: 10:11 PM Means of arrival: Ambulance Comments: Left lower abd pain

## 2014-07-01 NOTE — Telephone Encounter (Signed)
Spoke with patient need to hydrate more, avoid NSAIDS, T3 normal so no change. Patient request copy of labs. Copy and mail labs. Abstract labs.

## 2014-07-01 NOTE — ED Notes (Signed)
Pt arrives to the ER from home with complaints of left lower abd pain radiating to left groin area; pt states that he has a hx of kidney stones but that it has been awhile since he has had one and not sure if that is what is happening; pt states that he saw the MD today; pt c/o nausea with 1 episode of vomiting

## 2014-07-01 NOTE — ED Provider Notes (Signed)
CSN: [id number]     Arrival date & time 07/01/14  2229 History   None    Chief Complaint  Patient presents with  . Abdominal Pain    HPI Patient presents to the emergency room with complaints of left lower abdominal pain. Symptoms started within the last day. The patient had some aching lower abdominal pain seemed to move towards the left groin area. Then seemed to move towards the midline area. Throughout the day he has had some generalized nausea. He's also noticed some change in his bowel movements and has felt constipated. He has not had any trouble with vomiting but did feel feverish and chilled. Right now he is not having any pain but does have some vague sensation of discomfort in the left lower abdominal area. He also continues to have some nausea. Patient denies any dysuria but he does have history of prostate cancer and needs to self cath. He denies any history of diverticulitis or any abdominal surgeries other than his prostate surgery.  Does not want anything for pain at this time. Past Medical History  Diagnosis Date  . Urethral stricture   . Urinary retention with incomplete bladder emptying   . History of prostate cancer DX  2002    S/P EXTERNAL RADIATION/ RADIOACTIVE SEED IMPLANTS  2003  . Prostate hypertrophy   . OSA (obstructive sleep apnea) intolerant cpap  . Self-catheterizes urinary bladder     QID   AND   PRN  . HTN (hypertension)   . Insomnia with sleep apnea   . Saliva decreased   . Mouth dryness   . S/P radiation therapy ended 01-26-2012       for tonsillar cancer  . 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 History of squamous cell carcinoma of skin   . Restless legs syndrome (RLS) 10/31/2011  . Pain in limb 08/08/2011  . Loss of weight 08/08/2011  . Nocturia 08/08/2011   . Other abnormal blood chemistry 01/31/2011  . Basal cell carcinoma of skin of other and unspecified parts of face 01/28/2011  . Squamous cell carcinoma of skin of other and unspecified parts of face 01/28/2011  . Vitamin B 12 deficiency 01/28/2011  . Actinic keratosis 01/28/2011  . Degeneration of intervertebral disc, site unspecified 01/28/2011  . Other malaise and fatigue 01/28/2011  . Malignant neoplasm of prostate 01/28/2000  . DDD (degenerative disc disease)   . Vitamin D deficiency   . Hyperlipemia   . Renal disorder     Kidney stones   Past Surgical History  Procedure Laterality Date  . Radioactive seed implants, prostate  JAN  2003  . Cysto/ balloon dilation of  urethral stricture  12-25-2010  . Cystoscopy with urethral dilatation  05/31/2011    Procedure: CYSTOSCOPY WITH URETHRAL DILATATION;  Surgeon: Reece Packer, MD;  Location: [facility];  Service: Urology;  Laterality: N/A;  BALLOON DILATION  . Cataract extraction w/ intraocular lens  implant, bilateral    . Cystoscopy/retrograde/ureteroscopy N/A 05/22/2012    Procedure: CYSTOSCOPY BALLOON DILATION RETROGRADE URETEROGRAM ;  Surgeon: Reece Packer, MD;  Location: [facility];  Service: Urology;  Laterality: N/A;  . Inguinal hernia repair  2000    Right side  . Skin lesion excision  07/2012  MOST  right shoulder   No family history on file. History  Substance Use Topics  . Smoking status: Never Smoker   . Smokeless tobacco: Never Used  . Alcohol Use: 0.6 oz/week    1 Glasses of wine per week     Comment: 8oz    Review of Systems  Respiratory: Positive for cough (he has had a cold recently).   Cardiovascular: Negative for chest pain.  Gastrointestinal: Positive for constipation. Negative for vomiting, diarrhea and blood in stool.  All other systems reviewed and are negative.     Allergies  Cephalosporins and Suprax  Home Medications   Prior to Admission medications    Medication Sig Start Date End Date Taking? Authorizing Provider  Ascorbic Acid (VITAMIN C PO) Take 1 capsule by mouth every other day. 8000 MG    Historical Provider, MD  Bromelain 100 MG TABS Take 150 mg by mouth 2 (two) times daily. Take 1 1/2 tablet twice daily.    Historical Provider, MD  buPROPion (WELLBUTRIN) 100 MG tablet TAKE ONE TABLET BY MOUTH TWICE DAILY 06/30/14   Tiffany L Reed, DO  Calcium Carbonate 1500 MG TABS Take 1 tablet by mouth daily.     Historical Provider, MD  Cholecalciferol (VITAMIN D3) 1000 UNITS CAPS Take by mouth. Take 4,000 daily 03/23/10   Historical Provider, MD  Chondroitin Sulfate A 150 MG CAPS Take by mouth. 05/27/08   Historical Provider, MD  Coenzyme Q10 (COQ10) 100 MG CAPS Take 100 mg by mouth daily. Take one daily    Historical Provider, MD  COLLAGEN PO Take 600 mg by mouth 2 (two) times daily. Take 600 mg twice daily.    Historical Provider, MD  COLOSTRUM PO Take by mouth. One teaspoon daily 1 oz of water    Historical Provider, MD  DIGESTIVE ENZYMES PO Take 500 mg by mouth daily. Take 500 mg twice daily    Historical Provider, MD  fish oil-omega-3 fatty acids 1000 MG capsule Take 2 g by mouth daily.    Historical Provider, MD  GLUCOSAMINE-CHONDROITIN-MSM PO Take 500 mg by mouth 2 (two) times daily. Take 500 mg twice daily.    Historical Provider, MD  Hyaluronic Acid-Vitamin C (HYALURONIC ACID PO) Take 100 mg by mouth 2 (two) times daily. 100mg  twice a day    Historical Provider, MD  lisinopril (PRINIVIL,ZESTRIL) 10 MG tablet Take 1 tablet (10 mg total) by mouth daily. 06/20/12   Mardene Celeste, NP  Methylcobalamin 1 MG CHEW One dally for B12 supplement 11/04/13   Estill Dooms, MD  Multiple Vitamins-Minerals (MULTIVITAMINS THER. W/MINERALS) TABS Take 2 tablets by mouth 2 (two) times daily.     Historical Provider, MD  QUERCETIN PO Take 250 mg by mouth daily. Take 500 mg daily    Historical Provider, MD  RESVERATROL PO Take 100 mg by mouth 2 (two) times  daily. Take 2 100 mg tablets twice daily.    Historical Provider, MD  thyroid (ARMOUR) 130 MG tablet Take 130 mg by mouth. Take one tablet for thyroid supplement    Historical Provider, MD  Turmeric, Curcuma [place]a, (CURCUMIN EXTRACT) POWD Take by mouth. 03/23/10   Historical Provider, MD  vitamin E 400 UNIT capsule Take 400 Units by mouth daily. Take 1 tablet daily for vitamin e supplement.    Historical Provider, MD   BP 159/69 mmHg  Pulse 88  Temp(Src) 98.9 F (37.2 C)  Resp 18  SpO2 96% Physical Exam  Constitutional: No distress.  HENT:  Head: Normocephalic and atraumatic.  Right Ear: External ear normal.  Left Ear: External ear normal.  Eyes: Conjunctivae are normal. Right eye exhibits no discharge. Left eye exhibits no discharge. No scleral icterus.  Neck: Neck supple. No tracheal deviation present.  Cardiovascular: Normal rate, regular rhythm and intact distal pulses.   Pulmonary/Chest: Effort normal and breath sounds normal. No stridor. No respiratory distress. He has no wheezes. He has no rales.  Abdominal: Soft. Bowel sounds are normal. He exhibits no distension. There is no tenderness. There is no rebound and no guarding.  Musculoskeletal: He exhibits no edema or tenderness.  Neurological: He is alert. He has normal strength. No cranial nerve deficit (no facial droop, extraocular movements intact, no slurred speech) or sensory deficit. He exhibits normal muscle tone. He displays no seizure activity. Coordination normal.  Skin: Skin is warm and dry. No rash noted.  Psychiatric: He has a normal mood and affect.  Nursing note and vitals reviewed.   ED Course  Procedures (including critical care time) Labs Review Labs Reviewed  CBC WITH DIFFERENTIAL/PLATELET  COMPREHENSIVE METABOLIC PANEL  LIPASE, BLOOD  URINALYSIS, ROUTINE W REFLEX MICROSCOPIC (NOT AT [facility])     MDM   Will start with labs and UA.  DDX include UTI, pyelo, kidney stone, diverticulitis.  Consider CT scan  following initial tests.  Will turn over case to oncoming MD.    Dorie Rank, MD 07/01/14 2303

## 2014-07-02 ENCOUNTER — Encounter: Payer: Self-pay | Admitting: Pulmonary Disease

## 2014-07-02 ENCOUNTER — Ambulatory Visit (INDEPENDENT_AMBULATORY_CARE_PROVIDER_SITE_OTHER): Payer: Medicare Other | Admitting: Pulmonary Disease

## 2014-07-02 VITALS — BP 98/64 | HR 75 | Ht 73.0 in | Wt 153.0 lb

## 2014-07-02 DIAGNOSIS — I10 Essential (primary) hypertension: Secondary | ICD-10-CM | POA: Diagnosis not present

## 2014-07-02 DIAGNOSIS — G4733 Obstructive sleep apnea (adult) (pediatric): Secondary | ICD-10-CM

## 2014-07-02 DIAGNOSIS — G4761 Periodic limb movement disorder: Secondary | ICD-10-CM | POA: Diagnosis not present

## 2014-07-02 LAB — URINALYSIS, ROUTINE W REFLEX MICROSCOPIC
Bilirubin Urine: NEGATIVE
Glucose, UA: NEGATIVE mg/dL
Ketones, ur: 40 mg/dL — AB
NITRITE: NEGATIVE
Protein, ur: 30 mg/dL — AB
SPECIFIC GRAVITY, URINE: 1.022 (ref 1.005–1.030)
UROBILINOGEN UA: 0.2 mg/dL (ref 0.0–1.0)
pH: 5 (ref 5.0–8.0)

## 2014-07-02 LAB — URINE MICROSCOPIC-ADD ON

## 2014-07-02 MED ORDER — CIPROFLOXACIN HCL 500 MG PO TABS: 500.0000 mg | ORAL_TABLET | Freq: Two times a day (BID) | ORAL | Status: DC

## 2014-07-02 MED ORDER — CIPROFLOXACIN HCL 500 MG PO TABS
500.0000 mg | ORAL_TABLET | Freq: Once | ORAL | Status: AC
Start: 2014-07-02 — End: 2014-07-02
  Administered 2014-07-02: 500 mg via ORAL
  Filled 2014-07-02: qty 1

## 2014-07-02 MED ORDER — CIPROFLOXACIN IN D5W 400 MG/200ML IV SOLN: 400.0000 mg | Freq: Once | INTRAVENOUS | Status: DC

## 2014-07-02 NOTE — Assessment & Plan Note (Signed)
PSG if no improvement with requip DD includes sleep state mis perception vs CSA

## 2014-07-02 NOTE — Patient Instructions (Signed)
STOP lisinopril until BP improves to 140 or above Once recovered from urine infection, start Requip 0.5 mg at bedtime  If no effect after 5 nights, increase to 2 tabs at bedtime Report back - if no effect , will proceed with sleep study

## 2014-07-02 NOTE — ED Provider Notes (Signed)
UTI: Spoke with family who wants to ensure there was a urine culture. This is ordered.   Abdomen: soft, NT, ND heractive BS.   Patient is well appearing  No indication for CT or admission at this time   EDP stated it would be unwise to wait for results given degree of infection and the fact that patient is here for pain.  They refused IV cipro.  Have written for PO cipro and they will follow up with Dr. Matilde Sprang in the am.  Return for weakness vomiting fevers or any concerns  Blayden Conwell, MD 07/02/14 0111

## 2014-07-02 NOTE — Progress Notes (Signed)
Subjective:    Patient ID: Philip [name], male    DOB: 1932-01-28, 81 y.o.   MRN: [id number]  HPI  81 year old presents for evaluation of OSA and excessive daytime tiredness. PSG 01/2008 showed AHI of 19 per hour-on review central events were 13 per hour and obstructive events were about 5 per hour-so this appears to be predominantly central apnea. Surprisingly on a subsequent titration study in 02/2008, this was corrected by nasal CPAP 11 cm. He unfortunately did not tolerate CPAP well and abandoned at. His main complaint now is that he wakes up early around 2 AM and then although he stays in bed does not go into deep sleep. His wife Angelita Ingles, however states that whenever she wakes up in the morning he appears to be in a deep sleep.  Epworth sleepiness score is 2-but he reports excessive daytime tiredness. 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  Bedtime is around 10:30 PM, sleep latency is minimal, he sleeps on his back or on his side with one pillow, reports one nocturnal awakening, denies nocturia, is up around 2-3 a.m. and is out of bed by 7:30 feeling tired, without headaches or dryness of mouth. He is lost 14 pounds in the last 2 years He was diagnosed with tonsillar cancer, treated with radiation and has been cancer free for 2 years. He takes multiple vitamin supplements. He drinks 1-2 glasses of wine daily and one caffeinated beverage in the morning He is planning a trip to [place] over the next 2 weeks. He reports leg movements during sleep and is not sure whether this is waking him up. Significant PLM's were noted during polysomnogram -NPSG 2010:  123 PLMS with 2.4/hr of sleep with arousal or awakening. Titration 2010:  407 PLMS with 28/hr of sleep with disruption.   He had an ER visit last night and was diagnosed with UTI, placed on ciprofloxacin, creatinine increased to 1.5, his blood pressure is low today-Medr review shows lisinopril  Past  Medical History  Diagnosis Date  . Urethral stricture   . Urinary retention with incomplete bladder emptying   . History of prostate cancer DX  2002    S/P EXTERNAL RADIATION/ RADIOACTIVE SEED IMPLANTS  2003  . Prostate hypertrophy   . OSA (obstructive sleep apnea) intolerant cpap  . Self-catheterizes urinary bladder     QID   AND   PRN  . HTN (hypertension)   . Insomnia with sleep apnea   . Saliva decreased   . Mouth dryness   . S/P radiation therapy ended 01-26-2012       for tonsillar cancer  . 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 History of squamous cell carcinoma of skin   . Restless legs syndrome (RLS) 10/31/2011  . Pain in limb 08/08/2011  . Loss of weight 08/08/2011  . Nocturia 08/08/2011  . Other abnormal blood chemistry 01/31/2011  . Basal cell carcinoma of skin of other and unspecified parts of face 01/28/2011  . Squamous cell carcinoma of skin of other and unspecified parts of face 01/28/2011  . Vitamin B 12 deficiency 01/28/2011  . Actinic keratosis 01/28/2011  . Degeneration of intervertebral disc, site unspecified 01/28/2011  . Other malaise and fatigue 01/28/2011  . Malignant neoplasm of prostate  01/28/2000  . DDD (degenerative disc disease)   . Vitamin D deficiency   . Hyperlipemia   . Renal disorder     Kidney stones    Past Surgical History  Procedure Laterality Date  . Radioactive seed implants, prostate  JAN  2003  . Cysto/ balloon dilation of  urethral stricture  12-25-2010  . Cystoscopy with urethral dilatation  05/31/2011    Procedure: CYSTOSCOPY WITH URETHRAL DILATATION;  Surgeon: Reece Packer, MD;  Location: [facility];  Service: Urology;  Laterality: N/A;  BALLOON DILATION  . Cataract extraction w/ intraocular lens  implant, bilateral      . Cystoscopy/retrograde/ureteroscopy N/A 05/22/2012    Procedure: CYSTOSCOPY BALLOON DILATION RETROGRADE URETEROGRAM ;  Surgeon: Reece Packer, MD;  Location: [facility];  Service: Urology;  Laterality: N/A;  . Inguinal hernia repair  2000    Right side  . Skin lesion excision  07/2012    MOST  right shoulder    Allergies  Allergen Reactions  . Cephalosporins Rash  . Suprax [Cefixime] Rash    History   Social History  . Marital Status: Married    Spouse Name: N/A  . Number of Children: N/A  . Years of Education: N/A   Occupational History  . retired    Social History Main Topics  . Smoking status: Never Smoker   . Smokeless tobacco: Never Used  . Alcohol Use: 0.6 oz/week    1 Glasses of wine per week     Comment: 8oz  . Drug Use: No  . Sexual Activity: Yes   Other Topics Concern  . 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   Review of Systems  Constitutional: Negative for fever, chills, activity change, appetite change and unexpected weight change.  HENT: Negative for congestion, dental problem, postnasal drip, rhinorrhea, sneezing, sore throat, trouble swallowing and voice change.   Eyes: Negative for visual disturbance.  Respiratory: Negative for cough, choking and shortness of breath.   Cardiovascular: Negative for chest pain and leg swelling.  Gastrointestinal: Negative for nausea, vomiting and abdominal pain.  Genitourinary: Negative for difficulty urinating.  Musculoskeletal: Negative for arthralgias.  Skin: Negative for rash.  Psychiatric/Behavioral: Negative for behavioral problems and confusion.       Objective:   Physical Exam  Gen. Pleasant, well-nourished, in no distress, normal affect ENT - no lesions, no post nasal drip Neck: No JVD, no thyromegaly, no carotid bruits Lungs: no use of accessory muscles, no dullness to  percussion, clear without rales or rhonchi  Cardiovascular: Rhythm regular, heart sounds  normal, no murmurs or gallops, no peripheral edema Abdomen: soft and non-tender, no hepatosplenomegaly, BS normal. Musculoskeletal: No deformities, no cyanosis or clubbing Neuro:  alert, non focal       Assessment & Plan:

## 2014-07-02 NOTE — Assessment & Plan Note (Signed)
STOP lisinopril until BP improves to 140 or above Cr has worsened with UTI

## 2014-07-02 NOTE — Assessment & Plan Note (Signed)
Once recovered from urine infection, start Requip 0.5 mg at bedtime  If no effect after 5 nights, increase to 2 tabs at bedtime Report back - if no effect , will proceed with sleep study

## 2014-07-03 DIAGNOSIS — N390 Urinary tract infection, site not specified: Secondary | ICD-10-CM | POA: Diagnosis not present

## 2014-07-03 DIAGNOSIS — N3020 Other chronic cystitis without hematuria: Secondary | ICD-10-CM | POA: Diagnosis not present

## 2014-07-03 DIAGNOSIS — R339 Retention of urine, unspecified: Secondary | ICD-10-CM | POA: Diagnosis not present

## 2014-07-03 DIAGNOSIS — N5201 Erectile dysfunction due to arterial insufficiency: Secondary | ICD-10-CM | POA: Diagnosis not present

## 2014-07-04 ENCOUNTER — Telehealth: Payer: Self-pay | Admitting: *Deleted

## 2014-07-04 NOTE — Telephone Encounter (Signed)
Avel Sensor called me and stated that patient left a message on her voicemail regarding taking Cipro and Florastor and going out of town soon. I tried calling patient and it rang busy. Patient wife called back. Wife stated that her husband has a severe UTI and prescribed Cipro. Was told to take Florastor along with the antibiotic and patient has question on why to take it. Explained to the patient that the Florastor help with the natural good bacteria that needs to be in the body. It helps prevent diarrhea and GI upset. And he needed to take for a week even after finishing up the antibiotic.  Patient stated that he will take it. Gave my extension if they have anymore questions and assured him to call anytime.

## 2014-07-11 ENCOUNTER — Encounter: Payer: Self-pay | Admitting: Internal Medicine

## 2014-07-16 ENCOUNTER — Emergency Department (HOSPITAL_COMMUNITY)
Admission: EM | Admit: 2014-07-16 | Discharge: 2014-07-16 | Discharge status: Against Medical Advice | Payer: Medicare Other | Attending: Emergency Medicine | Admitting: Emergency Medicine

## 2014-07-16 ENCOUNTER — Encounter (HOSPITAL_COMMUNITY): Payer: Self-pay | Admitting: Emergency Medicine

## 2014-07-16 DIAGNOSIS — R110 Nausea: Secondary | ICD-10-CM | POA: Insufficient documentation

## 2014-07-16 DIAGNOSIS — I10 Essential (primary) hypertension: Secondary | ICD-10-CM | POA: Diagnosis not present

## 2014-07-16 NOTE — ED Notes (Signed)
Pt states that he was recently admitted for a UTI and f/u with urologist but he now feels like the UTI is coming back because his stomach is upset and he is having n/v. Alert and oriented.

## 2014-07-17 DIAGNOSIS — N3020 Other chronic cystitis without hematuria: Secondary | ICD-10-CM | POA: Diagnosis not present

## 2014-07-17 DIAGNOSIS — N5201 Erectile dysfunction due to arterial insufficiency: Secondary | ICD-10-CM | POA: Diagnosis not present

## 2014-07-24 ENCOUNTER — Telehealth: Payer: Self-pay | Admitting: Pulmonary Disease

## 2014-07-24 NOTE — Telephone Encounter (Signed)
Patient needs refill on medications. Called patient to confirm medications he needs refilled. Awaiting call back from patient. Will hold in box until I hear from patient.

## 2014-07-28 NOTE — Telephone Encounter (Signed)
Left detailed message on voicemail advising patient to call back if he needs refills. Closing encounter.

## 2014-08-01 DIAGNOSIS — N390 Urinary tract infection, site not specified: Secondary | ICD-10-CM | POA: Diagnosis not present

## 2014-08-18 ENCOUNTER — Other Ambulatory Visit: Payer: Self-pay | Admitting: Internal Medicine

## 2014-09-02 ENCOUNTER — Encounter: Payer: Self-pay | Admitting: Internal Medicine

## 2014-09-03 ENCOUNTER — Encounter: Payer: Medicare Other | Admitting: Internal Medicine

## 2014-09-15 ENCOUNTER — Other Ambulatory Visit: Payer: Self-pay | Admitting: *Deleted

## 2014-09-15 NOTE — Telephone Encounter (Signed)
Patient wanted a different medication for thyroid refilled that was listed in his medication list. Explained to patient that he would need to be seen first before we could refill a medication that was not in his medication list. Patient has canceled 2 appointments. The mediation was prescribed by a Dr. [place]. Patient scheduled an appointment for Wednesday with Dr. Mariea Clonts.

## 2014-09-17 ENCOUNTER — Non-Acute Institutional Stay: Payer: Medicare Other | Admitting: Internal Medicine

## 2014-09-17 ENCOUNTER — Encounter: Payer: Self-pay | Admitting: Internal Medicine

## 2014-09-17 VITALS — BP 146/82 | HR 83 | Temp 98.2°F | Wt 158.0 lb

## 2014-09-17 DIAGNOSIS — G4700 Insomnia, unspecified: Secondary | ICD-10-CM | POA: Diagnosis not present

## 2014-09-17 DIAGNOSIS — E038 Other specified hypothyroidism: Secondary | ICD-10-CM

## 2014-09-17 DIAGNOSIS — E034 Atrophy of thyroid (acquired): Secondary | ICD-10-CM | POA: Diagnosis not present

## 2014-09-17 DIAGNOSIS — R339 Retention of urine, unspecified: Secondary | ICD-10-CM | POA: Diagnosis not present

## 2014-09-17 DIAGNOSIS — F329 Major depressive disorder, single episode, unspecified: Secondary | ICD-10-CM

## 2014-09-17 DIAGNOSIS — F32A Depression, unspecified: Secondary | ICD-10-CM

## 2014-09-17 DIAGNOSIS — I10 Essential (primary) hypertension: Secondary | ICD-10-CM | POA: Diagnosis not present

## 2014-09-17 DIAGNOSIS — G4733 Obstructive sleep apnea (adult) (pediatric): Secondary | ICD-10-CM

## 2014-09-17 MED ORDER — THYROID 130 MG PO TABS: 130.0000 mg | ORAL_TABLET | Freq: Every day | ORAL | Status: DC

## 2014-09-17 NOTE — Progress Notes (Signed)
Location: [facility]  Code Status: DNR Goals of Care: Advanced Directive information     Chief Complaint  Patient presents with  . Medical Management of Chronic Issues    thyroid, blood pressure, insomnia    HPI: Patient is a 81 y.o. male seen in the office today for followup of chronic medical issues.   He was seeing [facility] medication for management of his thyroid, however, he would like to transition management to Dr. Mariea Clonts.   He has some insomnia causing some problems. In November 2010 he was sleeping well, but had frequent wakings. Was diagnosed with OSA and prescribed CPAP but did not tolerate it. He is now waking up once at night and unable to fall back asleep and remains sleepy throughout the day. He is taking  Blood pressure is stable. He is taking lisinopril and beet root capsules. He doesn't take his lisinopril regurlarly, he takes it only 2-3 days per week. His numbers at home have been as high as 170/95. He is against medications because he believes that medications kill more people than road accidents.   Takes Wellbutrin for depression. He sometimes forgets to take his nightime dose. He has tried taking it with dinner to remind.  Denies suicidal ideations.   He also has restless leg syndrome. Feels that magnesium is helping with that.   Has history of throat cancer. Has dry mouth and decreased taste and this inhibits his appetite somewhat.   History of prostate cancer. He self catheterizes without difficulty, it is sometimes difficult to insert the catheter. He is not followed by urology   Review of Systems:  Review of Systems  Constitutional: Positive for weight loss and malaise/fatigue. Negative for fever and chills.       Decreased appetite  Respiratory: Negative for cough and shortness of breath.   Cardiovascular: Negative for chest pain and palpitations.  Gastrointestinal: Negative for nausea, vomiting, abdominal pain, diarrhea and  constipation.  Genitourinary: Negative for dysuria, urgency and hematuria.  Musculoskeletal: Negative for myalgias and joint pain.  Neurological: Negative for dizziness and tremors.  Psychiatric/Behavioral: Positive for depression and memory loss. Negative for suicidal ideas.       Notes decrease in memory, not interfering function    Past Medical History  Diagnosis Date  . Urethral stricture   . Urinary retention with incomplete bladder emptying   . History of prostate cancer DX  2002    S/P EXTERNAL RADIATION/ RADIOACTIVE SEED IMPLANTS  2003  . Prostate hypertrophy   . OSA (obstructive sleep apnea) intolerant cpap  . Self-catheterizes urinary bladder     QID   AND   PRN  . HTN (hypertension)   . Insomnia with sleep apnea   . Saliva decreased   . Mouth dryness   . S/P radiation therapy ended 01-26-2012       for tonsillar cancer  . 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 History of squamous cell carcinoma of skin   . Restless legs syndrome (RLS) 10/31/2011  . Pain in limb 08/08/2011  . Loss of weight 08/08/2011  . Nocturia 08/08/2011  . Other abnormal blood chemistry 01/31/2011  . Basal cell carcinoma of skin of other and unspecified parts of face 01/28/2011  . Squamous cell carcinoma of skin of other  and unspecified parts of face 01/28/2011  . Vitamin B 12 deficiency 01/28/2011  . Actinic keratosis 01/28/2011  . Degeneration of intervertebral disc, site unspecified 01/28/2011  . Other malaise and fatigue 01/28/2011  . Malignant neoplasm of prostate 01/28/2000  . DDD (degenerative disc disease)   . Vitamin D deficiency   . Hyperlipemia   . Renal disorder     Kidney stones    Past Surgical History  Procedure Laterality Date  . Radioactive seed implants, prostate  JAN  2003  . Cysto/  balloon dilation of  urethral stricture  12-25-2010  . Cystoscopy with urethral dilatation  05/31/2011    Procedure: CYSTOSCOPY WITH URETHRAL DILATATION;  Surgeon: Reece Packer, MD;  Location: [facility];  Service: Urology;  Laterality: N/A;  BALLOON DILATION  . Cataract extraction w/ intraocular lens  implant, bilateral    . Cystoscopy/retrograde/ureteroscopy N/A 05/22/2012    Procedure: CYSTOSCOPY BALLOON DILATION RETROGRADE URETEROGRAM ;  Surgeon: Reece Packer, MD;  Location: [facility];  Service: Urology;  Laterality: N/A;  . Inguinal hernia repair  2000    Right side  . Skin lesion excision  07/2012    MOST  right shoulder    Allergies  Allergen Reactions  . Cephalosporins Rash  . Suprax [Cefixime] Rash   Medications: Patient's Medications  New Prescriptions   No medications on file  Previous Medications   ASCORBIC ACID (VITAMIN C PO)    Take 1 capsule by mouth every other day. 8000 MG   BUPROPION (WELLBUTRIN) 100 MG TABLET    TAKE ONE TABLET BY MOUTH TWICE DAILY   CALCIUM CARBONATE 1500 MG TABS    Take 1 tablet by mouth daily.    CHOLECALCIFEROL (VITAMIN D3) 1000 UNITS CAPS    Take by mouth. Take 4,000 daily   CHONDROITIN SULFATE A 150 MG CAPS    Take by mouth.   COENZYME Q10 (COQ10) 100 MG CAPS    Take 100 mg by mouth daily. Take one daily   COLLAGEN PO    Take 600 mg by mouth 2 (two) times daily. Take 600 mg twice daily.   COLOSTRUM PO    Take by mouth. One teaspoon daily 1 oz of water   DIGESTIVE ENZYMES PO    Take 500 mg by mouth daily. Take 500 mg twice daily   FISH OIL-OMEGA-3 FATTY ACIDS 1000 MG CAPSULE    Take 2 g by mouth daily.   GLUCOSAMINE-CHONDROITIN-MSM PO    Take 500 mg by mouth 2 (two) times daily. Take 500 mg twice daily.   HYALURONIC ACID-VITAMIN C (HYALURONIC ACID PO)    Take 100 mg by mouth 2 (two) times daily. 100mg  twice a day   LISINOPRIL (PRINIVIL,ZESTRIL) 10 MG TABLET    Take 1 tablet (10 mg total) by mouth  daily.   METHYLCOBALAMIN 1 MG CHEW    One dally for B12 supplement   MULTIPLE VITAMINS-MINERALS (MULTIVITAMINS THER. W/MINERALS) TABS    Take 2 tablets by mouth 2 (two) times daily.    OVER THE COUNTER MEDICATION    Various supplements for early stages of macular degeneration.   QUERCETIN PO    Take 250 mg by mouth daily. Take 500 mg daily   RESVERATROL PO    Take 100 mg by mouth 2 (two) times daily. Take 2 100 mg tablets twice daily.   THYROID (ARMOUR) 130 MG TABLET    Take 130 mg by mouth. Take one tablet for thyroid supplement  TURMERIC, CURCUMA LONGA, (CURCUMIN EXTRACT) POWD    Take by mouth.   VITAMIN E 400 UNIT CAPSULE    Take 400 Units by mouth daily. Take 1 tablet daily for vitamin e supplement.  Modified Medications   No medications on file  Discontinued Medications   CIPROFLOXACIN (CIPRO) 500 MG TABLET    Take 1 tablet (500 mg total) by mouth 2 (two) times daily. One po bid x 7 days    Physical Exam: Filed Vitals:   09/17/14 1341  BP: 146/82  Pulse: 83  Temp: 98.2 F (36.8 C)  TempSrc: Oral  Weight: 158 lb (71.668 kg)  SpO2: 98%   Physical Exam  Constitutional: He is oriented to person, place, and time. He appears well-developed and well-nourished.  Eyes: EOM are normal.  Cardiovascular: Normal rate, regular rhythm and normal heart sounds.   Pulmonary/Chest: Effort normal. He has no wheezes. He has no rales.  Abdominal: Soft. Bowel sounds are normal.  Musculoskeletal: Normal range of motion.  Neurological: He is alert and oriented to person, place, and time.  Skin: Skin is warm and dry.  Psychiatric: His behavior is normal. Thought content normal.  Flat affect at times    Labs reviewed: Basic Metabolic Panel:  Recent Labs  10/29/13 02/27/14 06/24/14 07/01/14 2251  NA 142  --  140 135  K 4.2  --  4.8 4.1  CL  --   --   --  105  CO2  --   --   --  22  GLUCOSE  --   --   --  120*  BUN 20  --  31* 26*  CREATININE 1.1  --  1.5* 1.59*  CALCIUM  --   --   --   8.4*  TSH 3.44 0.19* 0.10*  --    Liver Function Tests:  Recent Labs  10/29/13 06/24/14 07/01/14 2251  AST 15 12* 17  ALT 11 12 14*  ALKPHOS 47 62 69  BILITOT  --   --  1.0  PROT  --   --  7.3  ALBUMIN  --   --  3.5    Recent Labs  07/01/14 2251  LIPASE 27   No results for input(s): AMMONIA in the last 8760 hours. CBC:  Recent Labs  06/24/14 07/01/14 2251  WBC 6.3 9.8  NEUTROABS  --  8.0*  HGB 12.5* 12.7*  HCT 36* 37.0*  MCV  --  90.7  PLT 234 196   Lipid Panel:  Recent Labs  10/29/13 02/27/14  CHOL 288* 221*  HDL 63 61  LDLCALC 200 162  TRIG 106 77   No results found for: HGBA1C  Procedures since last visit:  Assessment/Plan  1. Hypothyroidism due to acquired atrophy of thyroid - check TSH - thyroid (NATURE-THROID) 130 MG tablet; Take 1 tablet (130 mg total) by mouth daily.  Dispense: 30 tablet; Refill: 3  2. OSA (obstructive sleep apnea) -patient refuses any interventions  3. Insomnia -mostly early waking -discussed getting out of bed and trying something calming until ready to return to bed  4. Depression -patient not interested in trying Wellbutrin XR to decrease likelihood of missing evening dose  5. Urinary retention with incomplete bladder emptying -continue self catheteriztation  6. Essential hypertension- -not currently at goal -Discussed importance of medication compliance and the risk of stroke and end organ damage. -Patient stated he will try to pay more attention to it  Labs/tests ordered: Next appt:  Mariana Kaufman, RN, BSN, OCN  [facility] [address], [place] [address] Cell Phone (Mon-Fri 8am-5pm):  [phone] On Call:  [phone] & follow prompts after 5pm & weekends Office Phone:  [phone] Office Fax:  [phone]

## 2014-09-18 DIAGNOSIS — E034 Atrophy of thyroid (acquired): Secondary | ICD-10-CM | POA: Diagnosis not present

## 2014-09-18 DIAGNOSIS — R5383 Other fatigue: Secondary | ICD-10-CM | POA: Diagnosis not present

## 2014-09-18 DIAGNOSIS — I10 Essential (primary) hypertension: Secondary | ICD-10-CM | POA: Diagnosis not present

## 2014-09-18 LAB — HEPATIC FUNCTION PANEL
ALK PHOS: 61 U/L (ref 25–125)
ALT: 12 U/L (ref 10–40)
AST: 15 U/L (ref 14–40)
BILIRUBIN, TOTAL: 0.4 mg/dL

## 2014-09-18 LAB — TSH: TSH: 0.15 u[IU]/mL — AB (ref 0.41–5.90)

## 2014-09-18 LAB — CBC AND DIFFERENTIAL
HEMATOCRIT: 41 % (ref 41–53)
HEMOGLOBIN: 13.4 g/dL — AB (ref 13.5–17.5)
Platelets: 213 10*3/uL (ref 150–399)
WBC: 7 10^3/mL

## 2014-09-18 LAB — BASIC METABOLIC PANEL
BUN: 27 mg/dL — AB (ref 4–21)
Creatinine: 1.6 mg/dL — AB (ref 0.6–1.3)
GLUCOSE: 88 mg/dL
Potassium: 4.2 mmol/L (ref 3.4–5.3)
Sodium: 145 mmol/L (ref 137–147)

## 2014-09-19 ENCOUNTER — Other Ambulatory Visit: Payer: Self-pay

## 2014-09-23 ENCOUNTER — Telehealth: Payer: Self-pay

## 2014-09-23 NOTE — Telephone Encounter (Signed)
Spoke with patient about 09/18/14 labs. Per Dr. Mariea Clonts both TSH and free T4 are low, only free T3 is normal "nature-thyroid". Asked patient per Dr. Mariea Clonts, if he is taking his medication properly (separate from all other pills and food by 30-60 minutes in morning). When he gets up he takes the thyroid medication, then about 15 minutes later he takes the Wellbutrin, then eats 30 minutes later. Per Dr. Mariea Clonts, take the Wellbutrin 30 minutes after the thyroid. Patient agrees with this.

## 2014-10-03 ENCOUNTER — Other Ambulatory Visit: Payer: Self-pay | Admitting: Internal Medicine

## 2014-10-09 DIAGNOSIS — L821 Other seborrheic keratosis: Secondary | ICD-10-CM | POA: Diagnosis not present

## 2014-10-09 DIAGNOSIS — L570 Actinic keratosis: Secondary | ICD-10-CM | POA: Diagnosis not present

## 2014-11-03 DIAGNOSIS — N359 Urethral stricture, unspecified: Secondary | ICD-10-CM | POA: Diagnosis not present

## 2014-11-04 ENCOUNTER — Encounter (HOSPITAL_BASED_OUTPATIENT_CLINIC_OR_DEPARTMENT_OTHER): Payer: Self-pay | Admitting: *Deleted

## 2014-11-04 ENCOUNTER — Other Ambulatory Visit: Payer: Self-pay | Admitting: Urology

## 2014-11-06 ENCOUNTER — Encounter (HOSPITAL_BASED_OUTPATIENT_CLINIC_OR_DEPARTMENT_OTHER): Payer: Self-pay | Admitting: *Deleted

## 2014-11-06 NOTE — Progress Notes (Signed)
NPO AFTER MN.  ARRIVE AT 1030.  NEEDS ISTAT AND EKG.  WILL TAKE WELLBUTRIN AM DOS W/ SIPS OF WATER

## 2014-11-07 NOTE — H&P (Signed)
History of Present Illness   I received a note from Philip. [name]'s therapist. He is interested in trying Levitra and/or Viagra for erectile dysfunction. He can get an erection but he loses it before orgasm. He thinks Cialis helped in the past but it gave him a headache for 24-48 hours.   His flow was slower but reasonable and steady with no change. He has mild stable frequency. He was dilated in May for his bladder neck and prostatic urethral scar tissue.    Philip [name] was last assessed in June 2016. I think his last dilation was in May 2014. He catheterizes regularly to keep his bladder neck open. We have talked about prophylactic dilation in the past. His culture on that visit was negative. On July 22, 2014, a culture was sent to me that was positive with staphylococcus. I reviewed my previous notes including my balloon dilation note from May 2014. He has had radiation of his prostate in [place].    Past Medical History Problems  1. History of depression (Z86.59) 2. History of sleep apnea (Z87.09) 3. History of Laryngeal Cancer 4. Personal history of prostate cancer (Z85.46)  Surgical History Problems  1. History of Cystoscopy For Urethral Stricture 2. History of Cystoscopy For Urethral Stricture 3. History of Cystoscopy For Urethral Stricture 4. History of Hernia Repair 5. History of Urethra Surgery  Current Meds 1. Acetyl L-Carnitine CAPS;  Therapy: (Recorded:11Jan2013) to Recorded 2. Arginine TABS;  Therapy: (Recorded:11Jan2013) to Recorded 3. Bromelain TABS;  Therapy: (Recorded:11Jan2013) to Recorded 4. BuPROPion HCl - 100 MG Oral Tablet;  Therapy: [id number] to Recorded 5. Calcium TABS;  Therapy: (Recorded:11Jan2013) to Recorded 6. Chondroitin Sulfate CAPS;  Therapy: (Recorded:11Jan2013) to Recorded 7. Collagen CAPS;  Therapy: (Recorded:11Jan2013) to Recorded 8. CoQ-10 10 MG Oral Capsule;  Therapy: (Recorded:11Jan2013) to Recorded 9. DHA Complete CAPS;  Therapy:  (Recorded:11Jan2013) to Recorded 10. Digestive Enzymes Oral Tablet;   Therapy: (Recorded:11Jan2013) to Recorded 11. EPA Plus CAPS;   Therapy: (Recorded:11Jan2013) to Recorded 12. Glucosamine CAPS;   Therapy: (Recorded:11Jan2013) to Recorded 13. Hyaluronic Acid Powder;   Therapy: (Recorded:11Jan2013) to Recorded 14. L-Arginine TABS;   Therapy: (Recorded:11Jan2013) to Recorded 15. L-Citrulline CAPS;   Therapy: (Recorded:11Jan2013) to Recorded 16. Lisinopril 10 MG Oral Tablet;   Therapy: [id number] to Recorded 17. Magnesium TABS;   Therapy: (Recorded:11Jan2013) to Recorded 18. MSM 1000 MG Oral Tablet;   Therapy: (Recorded:11Jan2013) to Recorded 19. Multi-Vitamin TABS;   Therapy: (Recorded:11Jan2013) to Recorded 20. Nature Thyroid TABS;   Therapy: (Recorded:19Dec2014) to Recorded 21. Nature-Throid TABS;   Therapy: (Recorded:16Jun2016) to Recorded 22. Pepsin Powder;   Therapy: (Recorded:11Jan2013) to Recorded 23. Potassium Chloride Powder;   Therapy: (Recorded:11Jan2013) to Recorded 24. Quercetin TABS;   Therapy: (Recorded:11Jan2013) to Recorded 25. Resveratrol 100 MG Oral Capsule;   Therapy: (Recorded:11Jan2013) to Recorded 26. Silica CAPS;   Therapy: (Recorded:11Jan2013) to Recorded 27. Strontium Chloride Crystals;   Therapy: (Recorded:11Jan2013) to Recorded 28. Sulfamethoxazole-Trimethoprim 800-160 MG Oral Tablet; take 1 tablet bid for 7 days;   Therapy: [id number] to (Last Rx:05Jul2016)  Requested for: [id number] Ordered 29. Vanadium CAPS;   Therapy: (Recorded:11Jan2013) to Recorded 30. Vitamin D CAPS;   Therapy: (Recorded:11Jan2013) to Recorded 31. Vitamin D3 CAPS;   Therapy: (Recorded:11Jan2013) to Recorded 32. Vitamin E CAPS;   Therapy: (Recorded:11Jan2013) to Recorded 33. Vitamin K TABS;   Therapy: (Recorded:11Jan2013) to Recorded  Allergies Medication  1. Suprax TABS  Family History Problems  1. Family history of Class I Angina : Father 2. Family history of  Class I  Angina : Sister 3. Family history of Death In The Family Father : Father   father passed @ age 73heart attack 45. Family history of Death In The Family Mother : Mother   mother passed @ age 64old age 66. Family history of Family Health Status Number Of Children   2 daughters  Social History Problems  1. Alcohol Use (History)   one glass of wine 2. Caffeine Use   2 cups of coffee 3. Marital History - Currently Married 4. Never A Smoker 5. Occupation: Retired   Emergency planning/management officer 6. Denied: History of Tobacco Use  Vitals Vital Signs [Data Includes: Last 1 Day]  Recorded: 17Oct2016 02:26PM  Blood Pressure: 149 / 81 Temperature: 98 F Heart Rate: 73  Assessment Assessed  1. Urethral stricture (N35.9)  Plan Urethral stricture  1. Follow-up Schedule Surgery Office  Follow-up  Status: Complete  Done: 17Oct2016  Discussion/Summary   In the last few weeks Philip Casebolt has been having trouble with resistance halfway through the urethra and then near the bladder neck he is forcing it. He has not had bladder fever or burning. He is catheterizing 4 times a day.   There are no other modifying factors, associated signs or symptoms. There are no aggravating or relieving factors. Presentation is moderate in severity and ongoing.   I discussed with Philip Searls the likely diagnosis and the fact that he does not need an in-office cystoscopy. I talked to him about balloon dilation and cystoscopy.   We talked about balloon dilation in detail. Pros, cons, general surgical and anesthetic risks, and other options including watchful waiting were discussed. He understands that dilation is generally successful in most cases but long-term success rates are low. We talked about the risk of persistent, de novo, or worsening incontinence and voiding dysfunction. Risks were described but not limited to the discussion of injury to neighboring structures and soft tissues. Bleeding, infection, pain,  erectile dysfunction, and spraying of urination were discussed. The risk of neuropathy was discussed as well as the usual post-operative course.  The natural history of _____ was discussed.   I spent about 10 minutes or so explaining why I do not think he should have a TURP. I will be happy to send him at any point in time for a second opinion but I have asked him to let me help him so he can make certain he gets a good opinion.   I truly believe that he has an exceptionally high incontinence rate if he does a TURP, and it has been wonderful that he is willing and able to self catheterize at age 89 and for the last 15 years.   He can proceed with dilation and proceed accordingly.   I have had these conversations about TURP with Philip Rademaker in the past.   After a thorough review of the management options for the patient's condition the patient  elected to proceed with surgical therapy as noted above. We have discussed the potential benefits and risks of the procedure, side effects of the proposed treatment, the likelihood of the patient achieving the goals of the procedure, and any potential problems that might occur during the procedure or recuperation. Informed consent has been obtained.

## 2014-11-10 ENCOUNTER — Ambulatory Visit (HOSPITAL_BASED_OUTPATIENT_CLINIC_OR_DEPARTMENT_OTHER): Payer: Medicare Other | Admitting: Anesthesiology

## 2014-11-10 ENCOUNTER — Ambulatory Visit (HOSPITAL_BASED_OUTPATIENT_CLINIC_OR_DEPARTMENT_OTHER)
Admission: RE | Admit: 2014-11-10 | Discharge: 2014-11-10 | Disposition: A | Discharge status: Home / Self Care | Payer: Medicare Other | Source: Ambulatory Visit | Attending: Urology | Admitting: Urology

## 2014-11-10 ENCOUNTER — Encounter (HOSPITAL_BASED_OUTPATIENT_CLINIC_OR_DEPARTMENT_OTHER)
Admission: RE | Disposition: A | Discharge status: Home / Self Care | Payer: Self-pay | Source: Ambulatory Visit | Attending: Urology

## 2014-11-10 ENCOUNTER — Encounter (HOSPITAL_BASED_OUTPATIENT_CLINIC_OR_DEPARTMENT_OTHER): Payer: Self-pay | Admitting: *Deleted

## 2014-11-10 DIAGNOSIS — Z8521 Personal history of malignant neoplasm of larynx: Secondary | ICD-10-CM | POA: Diagnosis not present

## 2014-11-10 DIAGNOSIS — N358 Other urethral stricture: Secondary | ICD-10-CM | POA: Diagnosis not present

## 2014-11-10 DIAGNOSIS — M1990 Unspecified osteoarthritis, unspecified site: Secondary | ICD-10-CM | POA: Diagnosis not present

## 2014-11-10 DIAGNOSIS — N359 Urethral stricture, unspecified: Secondary | ICD-10-CM | POA: Diagnosis not present

## 2014-11-10 DIAGNOSIS — Z8546 Personal history of malignant neoplasm of prostate: Secondary | ICD-10-CM | POA: Insufficient documentation

## 2014-11-10 DIAGNOSIS — K219 Gastro-esophageal reflux disease without esophagitis: Secondary | ICD-10-CM | POA: Insufficient documentation

## 2014-11-10 DIAGNOSIS — N529 Male erectile dysfunction, unspecified: Secondary | ICD-10-CM | POA: Diagnosis not present

## 2014-11-10 DIAGNOSIS — N400 Enlarged prostate without lower urinary tract symptoms: Secondary | ICD-10-CM | POA: Diagnosis not present

## 2014-11-10 DIAGNOSIS — I10 Essential (primary) hypertension: Secondary | ICD-10-CM | POA: Insufficient documentation

## 2014-11-10 DIAGNOSIS — E039 Hypothyroidism, unspecified: Secondary | ICD-10-CM | POA: Insufficient documentation

## 2014-11-10 DIAGNOSIS — F329 Major depressive disorder, single episode, unspecified: Secondary | ICD-10-CM | POA: Insufficient documentation

## 2014-11-10 DIAGNOSIS — Z79899 Other long term (current) drug therapy: Secondary | ICD-10-CM | POA: Diagnosis not present

## 2014-11-10 DIAGNOSIS — G4730 Sleep apnea, unspecified: Secondary | ICD-10-CM | POA: Diagnosis not present

## 2014-11-10 DIAGNOSIS — N320 Bladder-neck obstruction: Secondary | ICD-10-CM | POA: Diagnosis not present

## 2014-11-10 HISTORY — DX: Benign prostatic hyperplasia without lower urinary tract symptoms: N40.0

## 2014-11-10 HISTORY — DX: Other specified postprocedural states: Z98.890

## 2014-11-10 HISTORY — DX: Gastro-esophageal reflux disease without esophagitis: K21.9

## 2014-11-10 HISTORY — DX: Hypothyroidism, unspecified: E03.9

## 2014-11-10 HISTORY — DX: Complete loss of teeth, unspecified cause, unspecified class: K08.109

## 2014-11-10 HISTORY — DX: Bladder-neck obstruction: N32.0

## 2014-11-10 HISTORY — PX: BALLOON DILATION: SHX5330

## 2014-11-10 HISTORY — DX: Personal history of malignant neoplasm, unspecified: Z85.9

## 2014-11-10 HISTORY — DX: Unspecified macular degeneration: H35.30

## 2014-11-10 HISTORY — DX: Complete loss of teeth, unspecified cause, unspecified class: Z97.2

## 2014-11-10 LAB — POCT I-STAT 4, (NA,K, GLUC, HGB,HCT)
Glucose, Bld: 95 mg/dL (ref 65–99)
HCT: 41 % (ref 39.0–52.0)
Hemoglobin: 13.9 g/dL (ref 13.0–17.0)
Potassium: 4.2 mmol/L (ref 3.5–5.1)
Sodium: 140 mmol/L (ref 135–145)

## 2014-11-10 SURGERY — BALLOON DILATION
Anesthesia: Monitor Anesthesia Care

## 2014-11-10 MED ORDER — HYDROCODONE-ACETAMINOPHEN 5-325 MG PO TABS: 1.0000 | ORAL_TABLET | Freq: Four times a day (QID) | ORAL | Status: DC | PRN

## 2014-11-10 MED ORDER — CIPROFLOXACIN IN D5W 400 MG/200ML IV SOLN
INTRAVENOUS | Status: AC
  Filled 2014-11-10: qty 200

## 2014-11-10 MED ORDER — LACTATED RINGERS IV SOLN
INTRAVENOUS | Status: DC
  Administered 2014-11-10: 11:00:00 via INTRAVENOUS
  Filled 2014-11-10: qty 1000

## 2014-11-10 MED ORDER — PROPOFOL 500 MG/50ML IV EMUL
INTRAVENOUS | Status: DC | PRN
  Administered 2014-11-10: 200 ug/kg/min via INTRAVENOUS

## 2014-11-10 MED ORDER — IOHEXOL 350 MG/ML SOLN
INTRAVENOUS | Status: DC | PRN
  Administered 2014-11-10: 20 mL

## 2014-11-10 MED ORDER — FENTANYL CITRATE (PF) 100 MCG/2ML IJ SOLN
INTRAMUSCULAR | Status: AC
  Filled 2014-11-10: qty 4

## 2014-11-10 MED ORDER — FENTANYL CITRATE (PF) 100 MCG/2ML IJ SOLN
25.0000 ug | INTRAMUSCULAR | Status: DC | PRN
  Filled 2014-11-10: qty 1

## 2014-11-10 MED ORDER — FENTANYL CITRATE (PF) 100 MCG/2ML IJ SOLN
INTRAMUSCULAR | Status: DC | PRN
  Administered 2014-11-10 (×3): 50 ug via INTRAVENOUS

## 2014-11-10 MED ORDER — OXYCODONE HCL 5 MG/5ML PO SOLN
5.0000 mg | Freq: Once | ORAL | Status: DC | PRN
  Filled 2014-11-10: qty 5

## 2014-11-10 MED ORDER — ACETAMINOPHEN 160 MG/5ML PO SOLN
325.0000 mg | ORAL | Status: DC | PRN
  Filled 2014-11-10: qty 20.3

## 2014-11-10 MED ORDER — STERILE WATER FOR IRRIGATION IR SOLN
Status: DC | PRN
  Administered 2014-11-10: 3000 mL via INTRAVESICAL

## 2014-11-10 MED ORDER — LIDOCAINE HCL (CARDIAC) 20 MG/ML IV SOLN
INTRAVENOUS | Status: DC | PRN
  Administered 2014-11-10: 50 mg via INTRAVENOUS

## 2014-11-10 MED ORDER — CIPROFLOXACIN IN D5W 400 MG/200ML IV SOLN
400.0000 mg | INTRAVENOUS | Status: AC
  Administered 2014-11-10: 400 mg via INTRAVENOUS
  Filled 2014-11-10: qty 200

## 2014-11-10 MED ORDER — CIPROFLOXACIN HCL 250 MG PO TABS: 250.0000 mg | ORAL_TABLET | Freq: Two times a day (BID) | ORAL | Status: DC

## 2014-11-10 MED ORDER — ACETAMINOPHEN 325 MG PO TABS
325.0000 mg | ORAL_TABLET | ORAL | Status: DC | PRN
  Filled 2014-11-10: qty 2

## 2014-11-10 MED ORDER — OXYCODONE HCL 5 MG PO TABS
5.0000 mg | ORAL_TABLET | Freq: Once | ORAL | Status: DC | PRN
  Filled 2014-11-10: qty 1

## 2014-11-10 SURGICAL SUPPLY — 24 items
ADAPTER CATH URET PLST 4-6FR (CATHETERS) IMPLANT
BAG DRAIN CYSTO-URO STER (DRAIN) ×2 IMPLANT
BAG DRAIN URO-CYSTO SKYTR STRL (DRAIN) ×2 IMPLANT
BALLN NEPHROSTOMY (BALLOONS) ×2
BALLOON NEPHROSTOMY (BALLOONS) ×1 IMPLANT
CATH FOLEY 2W COUNCIL 5CC 16FR (CATHETERS) ×2 IMPLANT
CATH ROBINSON RED A/P 14FR (CATHETERS) ×2 IMPLANT
CATH URET 5FR 28IN CONE TIP (BALLOONS)
CATH URET 5FR 70CM CONE TIP (BALLOONS) IMPLANT
CLOTH BEACON ORANGE TIMEOUT ST (SAFETY) ×2 IMPLANT
GLOVE BIO SURGEON STRL SZ7.5 (GLOVE) ×2 IMPLANT
GOWN STRL REUS W/ TWL LRG LVL3 (GOWN DISPOSABLE) ×1 IMPLANT
GOWN STRL REUS W/ TWL XL LVL3 (GOWN DISPOSABLE) ×1 IMPLANT
GOWN STRL REUS W/TWL LRG LVL3 (GOWN DISPOSABLE) ×1
GOWN STRL REUS W/TWL XL LVL3 (GOWN DISPOSABLE) ×1
GUIDEWIRE STR DUAL SENSOR (WIRE) ×2 IMPLANT
HOLDER FOLEY CATH W/STRAP (MISCELLANEOUS) ×2 IMPLANT
KIT ROOM TURNOVER WOR (KITS) ×2 IMPLANT
MANIFOLD NEPTUNE II (INSTRUMENTS) ×2 IMPLANT
NS IRRIG 500ML POUR BTL (IV SOLUTION) IMPLANT
PACK CYSTO (CUSTOM PROCEDURE TRAY) ×2 IMPLANT
SYRINGE IRR TOOMEY STRL 70CC (SYRINGE) IMPLANT
TUBE CONNECTING 12X1/4 (SUCTIONS) IMPLANT
WATER STERILE IRR 3000ML UROMA (IV SOLUTION) ×2 IMPLANT

## 2014-11-10 NOTE — Anesthesia Preprocedure Evaluation (Addendum)
Anesthesia Evaluation  Patient identified by MRN, date of birth, ID band Patient awake    Reviewed: Allergy & Precautions, NPO status , Patient's Chart, lab work & pertinent test results  History of Anesthesia Complications Negative for: history of anesthetic complications  Airway Mallampati: II  TM Distance: >3 FB Neck ROM: Full    Dental  (+) Dental Advisory Given, Teeth Intact, Partial Lower,    Pulmonary sleep apnea ,    breath sounds clear to auscultation       Cardiovascular hypertension, Pt. on medications  Rhythm:Regular     Neuro/Psych PSYCHIATRIC DISORDERS Depression negative neurological ROS     GI/Hepatic Neg liver ROS, GERD  ,  Endo/Other  Hypothyroidism   Renal/GU negative Renal ROS     Musculoskeletal  (+) Arthritis , Osteoarthritis,    Abdominal   Peds  Hematology   Anesthesia Other Findings   Reproductive/Obstetrics                          Anesthesia Physical Anesthesia Plan  ASA: II  Anesthesia Plan: MAC   Post-op Pain Management:    Induction: Intravenous  Airway Management Planned: Nasal Cannula  Additional Equipment: None  Intra-op Plan:   Post-operative Plan:   Informed Consent: I have reviewed the patients History and Physical, chart, labs and discussed the procedure including the risks, benefits and alternatives for the proposed anesthesia with the patient or authorized representative who has indicated his/her understanding and acceptance.   Dental advisory given  Plan Discussed with: CRNA and Surgeon  Anesthesia Plan Comments:         Anesthesia Quick Evaluation

## 2014-11-10 NOTE — Anesthesia Procedure Notes (Signed)
Procedure Name: MAC Date/Time: 11/10/2014 12:18 PM Performed by: Wanita Chamberlain Pre-anesthesia Checklist: Patient identified, Timeout performed, Emergency Drugs available, Suction available and Patient being monitored Patient Re-evaluated:Patient Re-evaluated prior to inductionOxygen Delivery Method: Nasal cannula Intubation Type: IV induction Placement Confirmation: breath sounds checked- equal and bilateral and positive ETCO2

## 2014-11-10 NOTE — Op Note (Signed)
Preoperative diagnosis: Complex prostatic urethral stricture and bladder neck contracture secondary to radiation Postoperative diagnosis complex prostatic urethral stricture and bladder neck contracture secondary to radiation Surgery: Cystoscopy and retrograde urethrogram and balloon dilation of stricture Surgeon: Dr. Scott MacDiarmid  The patient has the above diagnoses and consented the above procedure. Extra care was taken with leg positioning under MAC anesthesia.  17.5 French cystoscope was utilized. The penile and bulbar urethra normal. The membranous urethra looked open with a mild stricture of approximately 18 French or larger at that level. He had bilobar enlargement of the prostate that I initially did not try to negotiate through it. He has an impressive false passage at 6:00 well epithelialized. He has had this before  Utilizing 10 mL of contrast and a syringe and an open-end 6 Fr cut ureteral catheter I did a retrograde urethrogram. He had narrowing at the level of the membranous urethra and but primarily the distal prosthetic urethra. Dye reached the bladder. The proximal bulbar urethra was normal caliber  Under cystoscopic and fluoroscopic guidance I passed a sensor wire curling nicely in the bladder. I used a well-prepared balloon dilation catheter and passed it with markers to the bladder neck and beyond. There was resistance at this level and the catheter would not readily advance further. I used the marker. I balloon dilated to 24 French with 18 atm of pressure for 2-3 minutes. A valve in the balloon dilation catheter popped partly deflating the balloon. The balloon was easily removed without bleeding.   After 2-3 minute dilation I decided to re- cystoscope. I could scope nicely along the urethra past the membranous urethra. He had a very rigid prostatic urethra like he had before with bilobar enlargement of the prostate. Bladder neck was open. There was no bleeding within the  prostatic urethra. I could see the wire at 12:00 curled up nicely in the bladder. He had heavy bladder trabeculation that was very impressive.  Based upon passage of the scope and rigidity a tissues I did not re-dilate further. I was pleased with the passage of the scope. Quite frankly I am surprised that he can catheterize easily because of the false passage at 6:00. As mentioned 2 years ago in my operative note a coud catheter might be prudent in the future if needed. Based on the findings i thought that it was safest to pass a 16 French [place] catheter over the wire for 1 day. It went in easily with little to no resistance. Urine was clear.  I did a low volume gentle cystogram just to check the bladder. His bladder was fairly full but there was no injury. His anesthesia was excellent  Patient was taken to recovery with instructions to remove the catheter tomorrow.  

## 2014-11-10 NOTE — Transfer of Care (Signed)
Immediate Anesthesia Transfer of Care Note  Patient: Philip [name]  Procedure(s) Performed: Procedure(s): CYSTO BALLOON DILATION AND RETROGRADE URETHROGRAM  (N/A)  Patient Location: PACU  Anesthesia Type:MAC  Level of Consciousness: awake, alert , oriented and patient cooperative  Airway & Oxygen Therapy: Patient Spontanous Breathing and Patient connected to nasal cannula oxygen  Post-op Assessment: Report given to RN and Post -op Vital signs reviewed and stable  Post vital signs: Reviewed and stable  Last Vitals:  Filed Vitals:   11/10/14 1047  BP: 165/92  Pulse: 69  Temp: 36.6 C  Resp: 12    Complications: No apparent anesthesia complications

## 2014-11-10 NOTE — Interval H&P Note (Signed)
History and Physical Interval Note:  11/10/2014 7:48 AM  Philip [name]  has presented today for surgery, with the diagnosis of [place]  The various methods of treatment have been discussed with the patient and family. After consideration of risks, benefits and other options for treatment, the patient has consented to  Procedure(s): CYSTO BALLOON DILATION AND RETROGRADE URETHROGRAM  (N/A) as a surgical intervention .  The patient's history has been reviewed, patient examined, no change in status, stable for surgery.  I have reviewed the patient's chart and labs.  Questions were answered to the patient's satisfaction.     Aanchal Cope A

## 2014-11-10 NOTE — Discharge Instructions (Signed)
I have reviewed discharge instructions in detail with the patient. They will follow-up with me or their physician as scheduled. My nurse will also call you.      Foley Catheter Care, Adult A Foley catheter is a soft, flexible tube that is placed into the bladder to drain urine. A Foley catheter may be inserted if:  You leak urine or are not able to control when you urinate (urinary incontinence).  You are not able to urinate when you need to (urinary retention).  You had prostate surgery or surgery on the genitals.  You have certain medical conditions, such as multiple sclerosis, dementia, or a spinal cord injury. If you are going home with a Foley catheter in place, follow the instructions below. TAKING CARE OF THE CATHETER 1. Wash your hands with soap and water. 2. Using mild soap and warm water on a clean washcloth:  Clean the area on your body closest to the catheter insertion site using a circular motion, moving away from the catheter. Never wipe toward the catheter because this could sweep bacteria up into the urethra and cause infection.  Remove all traces of soap. Pat the area dry with a clean towel. For males, reposition the foreskin. 3. Attach the catheter to your leg so there is no tension on the catheter. Use adhesive tape or a leg strap. If you are using adhesive tape, remove any sticky residue left behind by the previous tape you used. 4. Keep the drainage bag below the level of the bladder, but keep it off the floor. 5. Check throughout the day to be sure the catheter is working and urine is draining freely. Make sure the tubing does not become kinked. 6. Do not pull on the catheter or try to remove it. Pulling could damage internal tissues. TAKING CARE OF THE DRAINAGE BAGS You will be given two drainage bags to take home. One is a large overnight drainage bag, and the other is a smaller leg bag that fits underneath clothing. You may wear the overnight bag at any time, but you  should never wear the smaller leg bag at night. Follow the instructions below for how to empty, change, and clean your drainage bags. Emptying the Drainage Bag You must empty your drainage bag when it is  - full or at least 2-3 times a day. 1. Wash your hands with soap and water. 2. Keep the drainage bag below your hips, below the level of your bladder. This stops urine from going back into the tubing and into your bladder. 3. Hold the dirty bag over the toilet or a clean container. 4. Open the pour spout at the bottom of the bag and empty the urine into the toilet or container. Do not let the pour spout touch the toilet, container, or any other surface. Doing so can place bacteria on the bag, which can cause an infection. 5. Clean the pour spout with a gauze pad or cotton ball that has rubbing alcohol on it. 6. Close the pour spout. 7. Attach the bag to your leg with adhesive tape or a leg strap. 8. Wash your hands well. Changing the Drainage Bag Change your drainage bag once a month or sooner if it starts to smell bad or look dirty. Below are steps to follow when changing the drainage bag. 1. Wash your hands with soap and water. 2. Pinch off the rubber catheter so that urine does not spill out. 3. Disconnect the catheter tube from the drainage tube  at the connection valve. Do not let the tubes touch any surface. 4. Clean the end of the catheter tube with an alcohol wipe. Use a different alcohol wipe to clean the end of the drainage tube. 5. Connect the catheter tube to the drainage tube of the clean drainage bag. 6. Attach the new bag to the leg with adhesive tape or a leg strap. Avoid attaching the new bag too tightly. 7. Wash your hands well. Cleaning the Drainage Bag 1. Wash your hands with soap and water. 2. Wash the bag in warm, soapy water. 3. Rinse the bag thoroughly with warm water. 4. Fill the bag with a solution of white vinegar and water (1 cup vinegar to 1 qt warm water [.2 L  vinegar to 1 L warm water]). Close the bag and soak it for 30 minutes in the solution. 5. Rinse the bag with warm water. 6. Hang the bag to dry with the pour spout open and hanging downward. 7. Store the clean bag (once it is dry) in a clean plastic bag. 8. Wash your hands well. PREVENTING INFECTION  Wash your hands before and after handling your catheter.  Take showers daily and wash the area where the catheter enters your body. Do not take baths. Replace wet leg straps with dry ones, if this applies.  Do not use powders, sprays, or lotions on the genital area. Only use creams, lotions, or ointments as directed by your caregiver.  For females, wipe from front to back after each bowel movement.  Drink enough fluids to keep your urine clear or pale yellow unless you have a fluid restriction.  Do not let the drainage bag or tubing touch or lie on the floor.  Wear cotton underwear to absorb moisture and to keep your skin drier. SEEK MEDICAL CARE IF:   Your urine is cloudy or smells unusually bad.  Your catheter becomes clogged.  You are not draining urine into the bag or your bladder feels full.  Your catheter starts to leak. SEEK IMMEDIATE MEDICAL CARE IF:   You have pain, swelling, redness, or pus where the catheter enters the body.  You have pain in the abdomen, legs, lower back, or bladder.  You have a fever.  You see blood fill the catheter, or your urine is pink or red.  You have nausea, vomiting, or chills.  Your catheter gets pulled out. MAKE SURE YOU:   Understand these instructions.  Will watch your condition.  Will get help right away if you are not doing well or get worse.   This information is not intended to replace advice given to you by your health care provider. Make sure you discuss any questions you have with your health care provider.   Document Released: 01/03/2005 Document Revised: 05/20/2013 Document Reviewed: 12/26/2011 Elsevier Interactive  Patient Education 2016 [place] CARE INSTRUCTIONS  Activity: Rest for the remainder of the day.  Do not drive or operate equipment today.  You may resume normal activities in one to two days as instructed by your physician.   Meals: Drink plenty of liquids and eat light foods such as gelatin or soup this evening.  You may return to a normal meal plan tomorrow.  Return to Work: You may return to work in one to two days or as instructed by your physician.  Special Instructions / Symptoms: Call your physician if any of these symptoms occur:   -persistent or heavy bleeding  -bleeding which continues  after first few urination  -large blood clots that are difficult to pass  -urine stream diminishes or stops completely  -fever equal to or higher than 101 degrees Farenheit.  -cloudy urine with a strong, foul odor  -severe pain  Females should always wipe from front to back after elimination.  You may feel some burning pain when you urinate.  This should disappear with time.  Applying moist heat to the lower abdomen or a hot tub bath may help relieve the pain. \  Follow-Up / Date of Return Visit to Your Physician:  Call for an appointment to arrange follow-up.  Patient Signature:  ________________________________________________________  Nurse's Signature:  ________________________________________________________  Post Anesthesia Home Care Instructions  Activity: Get plenty of rest for the remainder of the day. A responsible adult should stay with you for 24 hours following the procedure.  For the next 24 hours, DO NOT: -Drive a car -Paediatric nurse -Drink alcoholic beverages -Take any medication unless instructed by your physician -Make any legal decisions or sign important papers.  Meals: Start with liquid foods such as gelatin or soup. Progress to regular foods as tolerated. Avoid greasy, spicy, heavy foods. If nausea and/or vomiting occur, drink only clear  liquids until the nausea and/or vomiting subsides. Call your physician if vomiting continues.  Special Instructions/Symptoms: Your throat may feel dry or sore from the anesthesia or the breathing tube placed in your throat during surgery. If this causes discomfort, gargle with warm salt water. The discomfort should disappear within 24 hours.  If you had a scopolamine patch placed behind your ear for the management of post- operative nausea and/or vomiting:  1. The medication in the patch is effective for 72 hours, after which it should be removed.  Wrap patch in a tissue and discard in the trash. Wash hands thoroughly with soap and water. 2. You may remove the patch earlier than 72 hours if you experience unpleasant side effects which may include dry mouth, dizziness or visual disturbances. 3. Avoid touching the patch. Wash your hands with soap and water after contact with the patch.

## 2014-11-11 ENCOUNTER — Encounter (HOSPITAL_BASED_OUTPATIENT_CLINIC_OR_DEPARTMENT_OTHER): Payer: Self-pay | Admitting: Urology

## 2014-11-11 NOTE — Anesthesia Postprocedure Evaluation (Signed)
  Anesthesia Post-op Note  Patient: Philip [name]  Procedure(s) Performed: Procedure(s): CYSTO BALLOON DILATION AND RETROGRADE URETHROGRAM  (N/A)  Patient Location: PACU  Anesthesia Type:MAC  Level of Consciousness: awake  Airway and Oxygen Therapy: Patient Spontanous Breathing  Post-op Pain: none  Post-op Assessment: Post-op Vital signs reviewed, Patient's Cardiovascular Status Stable, Respiratory Function Stable, Patent Airway, No signs of Nausea or vomiting and Pain level controlled              Post-op Vital Signs: Reviewed and stable  Last Vitals:  Filed Vitals:   11/10/14 1425  BP: 150/76  Pulse: 54  Temp: 36.1 C  Resp: 12    Complications: No apparent anesthesia complications

## 2014-11-19 DIAGNOSIS — N401 Enlarged prostate with lower urinary tract symptoms: Secondary | ICD-10-CM | POA: Diagnosis not present

## 2014-11-19 DIAGNOSIS — N138 Other obstructive and reflux uropathy: Secondary | ICD-10-CM | POA: Diagnosis not present

## 2014-11-21 DIAGNOSIS — H04123 Dry eye syndrome of bilateral lacrimal glands: Secondary | ICD-10-CM | POA: Diagnosis not present

## 2014-11-21 DIAGNOSIS — D3132 Benign neoplasm of left choroid: Secondary | ICD-10-CM | POA: Diagnosis not present

## 2014-11-21 DIAGNOSIS — H353132 Nonexudative age-related macular degeneration, bilateral, intermediate dry stage: Secondary | ICD-10-CM | POA: Diagnosis not present

## 2014-11-21 DIAGNOSIS — Z961 Presence of intraocular lens: Secondary | ICD-10-CM | POA: Diagnosis not present

## 2014-12-17 ENCOUNTER — Non-Acute Institutional Stay: Payer: Medicare Other | Admitting: Internal Medicine

## 2014-12-17 ENCOUNTER — Encounter: Payer: Self-pay | Admitting: Internal Medicine

## 2014-12-17 VITALS — BP 120/68 | HR 62 | Temp 97.6°F | Wt 161.0 lb

## 2014-12-17 DIAGNOSIS — G4733 Obstructive sleep apnea (adult) (pediatric): Secondary | ICD-10-CM | POA: Diagnosis not present

## 2014-12-17 DIAGNOSIS — E034 Atrophy of thyroid (acquired): Secondary | ICD-10-CM | POA: Diagnosis not present

## 2014-12-17 DIAGNOSIS — G4700 Insomnia, unspecified: Secondary | ICD-10-CM | POA: Diagnosis not present

## 2014-12-17 DIAGNOSIS — R339 Retention of urine, unspecified: Secondary | ICD-10-CM | POA: Diagnosis not present

## 2014-12-17 DIAGNOSIS — I10 Essential (primary) hypertension: Secondary | ICD-10-CM

## 2014-12-17 DIAGNOSIS — N35919 Unspecified urethral stricture, male, unspecified site: Secondary | ICD-10-CM

## 2014-12-17 DIAGNOSIS — E038 Other specified hypothyroidism: Secondary | ICD-10-CM

## 2014-12-17 DIAGNOSIS — N359 Urethral stricture, unspecified: Secondary | ICD-10-CM | POA: Diagnosis not present

## 2014-12-17 DIAGNOSIS — N529 Male erectile dysfunction, unspecified: Secondary | ICD-10-CM | POA: Diagnosis not present

## 2014-12-17 MED ORDER — SILDENAFIL CITRATE 100 MG PO TABS: 100.0000 mg | ORAL_TABLET | Freq: Every day | ORAL | Status: DC | PRN

## 2014-12-17 NOTE — Progress Notes (Signed)
Patient ID: Philip [name], male   DOB: 12/27/1932, 80 y.o.   MRN: [id number]   Location:  [facility]  Code Status: DNR  Goals of Care:Advanced Directive information Does patient have an advance directive?: Yes, Type of Advance Directive: [place];Out of facility DNR (pink MOST or yellow form), Pre-existing out of facility DNR order (yellow form or pink MOST form): Yellow form placed in chart (order not valid for inpatient use)  Chief Complaint  Patient presents with  . Medical Management of Chronic Issues    thyroid, blood pressure, insomnia.   . surgery    11/10/14 cystoscopy and retrograde urethrogram and balloon dilation of stricture, Dr. Matilde Sprang    HPI: Patient is a 81 y.o. white male seen in the [facility] today for med mgt of his chronic diseases including hypothyroidism, htn, insomnia, OSA,and urethral stricture s/p surgery.  He had prior prostate cancer with radiation treatment that led to the stricture.  Ability to catheterize has improved again.  Notes that he was able to get the procedure in the office in [place] for a lot Philip cost.    Notes energy is still low and b12 not checked lately.  Still not sleeping well.  Notes some friends take low dose ambien.  Started biome restoration--says they have been adding helminths to the diet.  Getting this from [place].    Depression:  Says his mood is fine.  Not bouncing with joy and enthusiasm and hard for him to get things done.  Still taking the bupropion.  He thinks it is helping.  If he forgets it, he feels more blah.  Negative about many things.  Legs ache all the way down when he gets up in the am.  Not sure if it's significant.  Improves with activity.  Has not had any known joint problems--says shoulders do ache at times with his table tennis.  Suspect arthritis and aging--discussed with him.   HTN:  bp at goal.  Continues on lisinopril.    Refuses to take prevnar in addition to the  pneumovax.  Review of Systems:  Review of Systems  Constitutional: Positive for malaise/fatigue. Negative for fever and chills.  HENT: Negative for congestion and hearing loss.   Eyes: Negative for blurred vision.  Respiratory: Negative for shortness of breath.   Cardiovascular: Negative for chest pain and leg swelling.  Gastrointestinal: Negative for abdominal pain, constipation, blood in stool and melena.  Genitourinary: Negative for dysuria.       Uses I/O catheter  Musculoskeletal: Positive for myalgias.       Of legs in am  Skin: Negative for rash.  Neurological: Negative for dizziness and weakness.  Psychiatric/Behavioral: Negative for depression and memory loss.    Past Medical History  Diagnosis Date  . Urethral stricture   . Urinary retention with incomplete bladder emptying   . History of prostate cancer DX  2002    S/P EXTERNAL RADIATION/ RADIOACTIVE SEED IMPLANTS  2003  . OSA (obstructive sleep apnea) intolerant cpap  . Self-catheterizes urinary bladder     QID   AND   PRN  . HTN (hypertension)   . Saliva decreased   . S/P radiation therapy ended 01-26-2012       for tonsillar cancer  . Restless legs syndrome (RLS) 10/31/2011  . Nocturia 08/08/2011  . Vitamin B 12 deficiency 01/28/2011  . Vitamin D deficiency   . Hyperlipemia   . DDD (degenerative disc disease)   .  History of prostate cancer     S/P  RADIACTIVE PROSTATE SEED IMPLANTS 2003  . Bladder neck contracture   . History of squamous cell carcinoma excision     2012--  RIGHT LOWER EXTREM  . Hypothyroidism   . BPH (benign prostatic hyperplasia)   . GERD (gastroesophageal reflux disease)   . Full dentures   . 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 Macular degeneration      Past Surgical History  Procedure Laterality Date  . Radioactive seed implants, prostate  JAN  2003  . Cysto/ balloon dilation of  urethral stricture  12-25-2010  . Cystoscopy with urethral dilatation  05/31/2011    Procedure: CYSTOSCOPY WITH URETHRAL DILATATION;  Surgeon: Reece Packer, MD;  Location: [facility];  Service: Urology;  Laterality: N/A;  BALLOON DILATION  . Cataract extraction w/ intraocular lens  implant, bilateral    . Cystoscopy/retrograde/ureteroscopy N/A 05/22/2012    Procedure: CYSTOSCOPY BALLOON DILATION RETROGRADE URETEROGRAM ;  Surgeon: Reece Packer, MD;  Location: [facility];  Service: Urology;  Laterality: N/A;  . Skin lesion excision  07/2012    MOST  right shoulder  . Inguinal hernia repair Right 2000  . Cytso/  dilatation urethral stricture/  bx prostatic urethra/  removal foreign bodies  06-25-2010   [facility]  . Balloon dilation N/A 11/10/2014    Procedure: CYSTO BALLOON DILATION AND RETROGRADE URETHROGRAM ;  Surgeon: Bjorn Loser, MD;  Location[facility];  Service: Urology;  Laterality: N/A;    Social History:   reports that he has never smoked. He has never used smokeless tobacco. He reports that he drinks about 4.2 oz of alcohol per week. He reports that he does not use illicit drugs.  Allergies  Allergen Reactions  . Cephalosporins Rash  . Suprax [Cefixime] Rash    Medications: Patient's Medications  New Prescriptions   No medications on file  Previous Medications   ASCORBIC ACID (VITAMIN C) 1000 MG TABLET    Take 1,000 mg by mouth daily.   ASHWAGANDHA PO    Take by mouth daily.   BUPROPION (WELLBUTRIN) 100 MG TABLET    TAKE ONE TABLET BY MOUTH TWICE DAILY   CALCIUM PO    Take 800 mg by mouth daily. TWO  400 MG TAB   CHOLECALCIFEROL (VITAMIN D3) 5000 UNITS CAPS    Take by mouth.   COENZYME Q10 (COQ10) 100 MG CAPS    Take 100 mg by mouth daily. Take one daily   COLLAGEN PO    Take 600 mg  by mouth 2 (two) times daily. Take 600 mg twice daily.   COLOSTRUM PO    Take by mouth. One teaspoon daily 1 oz of water   DIGESTIVE ENZYMES PO    Take 500 mg by mouth every morning.    FISH OIL-OMEGA-3 FATTY ACIDS 1000 MG CAPSULE    Take 2 g by mouth daily.   GLUCOSAMINE-CHONDROITIN-MSM PO    Take 500 mg by mouth 2 (two) times daily. Take 500 mg twice daily.   LISINOPRIL (PRINIVIL,ZESTRIL) 10 MG TABLET    Take 1 tablet (10 mg total) by mouth daily.   METHYLCOBALAMIN 1 MG CHEW    One dally for B12  supplement   MULTIPLE VITAMINS-MINERALS (MULTIVITAMINS THER. W/MINERALS) TABS    Take 2 tablets by mouth 2 (two) times daily.    OVER THE COUNTER MEDICATION    Various supplements for early stages of macular degeneration.   RESVERATROL PO    Take 100 mg by mouth 2 (two) times daily. Take 2 100 mg tablets twice daily.   THYROID (NATURE-THROID) 130 MG TABLET    Take 1 tablet (130 mg total) by mouth daily.   TURMERIC CURCUMIN PO    Take 650 mg by mouth every morning.   VITAMIN E (NATURAL VITAMIN E) 400 UNIT CAPSULE    Take 400 Units by mouth daily.  Modified Medications   No medications on file  Discontinued Medications   CIPROFLOXACIN (CIPRO) 250 MG TABLET    Take 1 tablet (250 mg total) by mouth 2 (two) times daily.   HYALURONIC ACID-VITAMIN C (HYALURONIC ACID PO)    Take 100 mg by mouth 2 (two) times daily. 100mg  twice a day   HYDROCODONE-ACETAMINOPHEN (NORCO) 5-325 MG TABLET    Take 1 tablet by mouth every 6 (six) hours as needed for moderate pain.   LEVONORGESTREL-ETHINYL ESTRAD (LUTERA PO)    Take by mouth daily.     Physical Exam: Filed Vitals:   12/17/14 0916  BP: 120/68  Pulse: 62  Temp: 97.6 F (36.4 C)  TempSrc: Oral  Weight: 161 lb (73.029 kg)  SpO2: 95%   Body mass index is 21.25 kg/(m^2). Physical Exam  Constitutional: He is oriented to person, place, and time. He appears well-developed and well-nourished. No distress.  Cardiovascular: Normal rate, regular rhythm, normal  heart sounds and intact distal pulses.   Pulmonary/Chest: Effort normal and breath sounds normal. No respiratory distress.  Abdominal: Soft. Bowel sounds are normal. He exhibits no distension. There is no tenderness.  Musculoskeletal: Normal range of motion.  Neurological: He is alert and oriented to person, place, and time.  Skin: Skin is warm and dry.  Psychiatric: He has a normal mood and affect.     Labs reviewed: Basic Metabolic Panel:  Recent Labs  02/27/14  06/24/14 07/01/14 2251 09/18/14 11/10/14 1115  NA  --   --  140 135 145 140  K  --   < > 4.8 4.1 4.2 4.2  CL  --   --   --  105  --   --   CO2  --   --   --  22  --   --   GLUCOSE  --   --   --  120*  --  95  BUN  --   --  31* 26* 27*  --   CREATININE  --   --  1.5* 1.59* 1.6*  --   CALCIUM  --   --   --  8.4*  --   --   TSH 0.19*  --  0.10*  --  0.15*  --   < > = values in this interval not displayed. Liver Function Tests:  Recent Labs  06/24/14 07/01/14 2251 09/18/14  AST 12* 17 15  ALT 12 14* 12  ALKPHOS 62 69 61  BILITOT  --  1.0  --   PROT  --  7.3  --   ALBUMIN  --  3.5  --     Recent Labs  07/01/14 2251  LIPASE 27   No results for input(s): AMMONIA in the last 8760 hours. CBC:  Recent Labs  06/24/14 07/01/14 2251 09/18/14  11/10/14 1115  WBC 6.3 9.8 7.0  --   NEUTROABS  --  8.0*  --   --   HGB 12.5* 12.7* 13.4* 13.9  HCT 36* 37.0* 41 41.0  MCV  --  90.7  --   --   PLT 234 196 213  --    Lipid Panel:  Recent Labs  02/27/14  CHOL 221*  HDL 61  LDLCALC 162  TRIG 77   Patient Care Team: Gayland Curry, DO as PCP - General (Geriatric Medicine) Bjorn Loser, MD as Consulting Physician (Urology) [facility] Bess Harvest, MD (Radiation Oncology)  Assessment/Plan 1. Hypothyroidism due to acquired atrophy of thyroid -cont current synthroid and f/u tsh next draw due to new med to see where he stands  2. OSA (obstructive sleep apnea) - continues not to use  any treatment  3. Insomnia -due to #2 but he does not accept that -he's considering Lorrin Mais which would be one of the things I would not recommend -melatonin has not been successful  4. Urinary retention with incomplete bladder emptying -catheterization is going more smoothly again after his stricture surgery  5. Stricture of male urethra, unspecified stricture type -s/p surgery with improvement  6. Essential hypertension -bp is at goal with his ace inhibitor, no side effects noted, cont same  7. Erectile dysfunction, unspecified erectile dysfunction type -requests viagra higher dose b/c 60mg  has not been effective--took multiple 10mg  pills w/o erection - sildenafil (VIAGRA) 100 MG tablet; Take 1 tablet (100 mg total) by mouth daily as needed for erectile dysfunction.  Dispense: 30 tablet; Refill:  3   Labs/tests ordered:  Cbc, cmp, flp, psa, b12, tsh, free T4 next draw Next appt:  4 mos for annual exam  Netha Dafoe L. Gissell Barra, D.O. [facility] [address], [place] [address] Cell Phone (Mon-Fri 8am-5pm):  [phone] On Call:  [phone] & follow prompts after 5pm & weekends Office Phone:  [phone] Office Fax:  [phone]

## 2014-12-18 DIAGNOSIS — N529 Male erectile dysfunction, unspecified: Secondary | ICD-10-CM | POA: Diagnosis not present

## 2014-12-18 DIAGNOSIS — I10 Essential (primary) hypertension: Secondary | ICD-10-CM | POA: Diagnosis not present

## 2014-12-18 DIAGNOSIS — E538 Deficiency of other specified B group vitamins: Secondary | ICD-10-CM | POA: Diagnosis not present

## 2014-12-18 DIAGNOSIS — E034 Atrophy of thyroid (acquired): Secondary | ICD-10-CM | POA: Diagnosis not present

## 2014-12-18 DIAGNOSIS — E785 Hyperlipidemia, unspecified: Secondary | ICD-10-CM | POA: Diagnosis not present

## 2014-12-18 LAB — LIPID PANEL
CHOLESTEROL: 221 mg/dL — AB (ref 0–200)
HDL: 59 mg/dL (ref 35–70)
LDL Cholesterol: 148 mg/dL
LDL/HDL RATIO: 2.5
TRIGLYCERIDES: 97 mg/dL (ref 40–160)

## 2014-12-18 LAB — HEPATIC FUNCTION PANEL
ALK PHOS: 71 U/L (ref 25–125)
ALT: 13 U/L (ref 10–40)
AST: 15 U/L (ref 14–40)
BILIRUBIN, TOTAL: 0.4 mg/dL

## 2014-12-18 LAB — CBC AND DIFFERENTIAL
HCT: 41 % (ref 41–53)
HEMOGLOBIN: 13.7 g/dL (ref 13.5–17.5)
Platelets: 262 10*3/uL (ref 150–399)
WBC: 8.5 10*3/mL

## 2014-12-18 LAB — BASIC METABOLIC PANEL
BUN: 27 mg/dL — AB (ref 4–21)
CREATININE: 1.6 mg/dL — AB (ref 0.6–1.3)
Glucose: 91 mg/dL
POTASSIUM: 4.6 mmol/L (ref 3.4–5.3)
SODIUM: 139 mmol/L (ref 137–147)

## 2014-12-18 LAB — TSH: TSH: 0.06 u[IU]/mL — AB (ref 0.41–5.90)

## 2014-12-18 LAB — PSA: PSA: 0.01

## 2014-12-23 ENCOUNTER — Telehealth: Payer: Self-pay

## 2014-12-23 NOTE — Telephone Encounter (Signed)
Called patient about lab results 12/18/14 per Dr. Mariea Clonts, blood counts, electrolytes, liver, B12, PSA normal. Thyroid closer to goal now but TSH more abnormal. As if he is taking the levothyroxine properly. He takes Petra Kuba throid in the morning 15-20 minutes before he eats. Needs to be 30 minutes. Bad cholesterol elevated, needs to decrease desserts, fried and fatty foods, increase exercise.  Will repeat Thyroid  Panel 02/03/15. Has physical 04/15/15.

## 2014-12-30 DIAGNOSIS — E039 Hypothyroidism, unspecified: Secondary | ICD-10-CM | POA: Diagnosis not present

## 2014-12-30 DIAGNOSIS — Z85818 Personal history of malignant neoplasm of other sites of lip, oral cavity, and pharynx: Secondary | ICD-10-CM | POA: Diagnosis not present

## 2014-12-30 DIAGNOSIS — T66XXXS Radiation sickness, unspecified, sequela: Secondary | ICD-10-CM | POA: Diagnosis not present

## 2014-12-30 DIAGNOSIS — Z923 Personal history of irradiation: Secondary | ICD-10-CM | POA: Diagnosis not present

## 2014-12-30 DIAGNOSIS — Z79899 Other long term (current) drug therapy: Secondary | ICD-10-CM | POA: Diagnosis not present

## 2014-12-30 LAB — TSH: TSH: 0.04 u[IU]/mL — AB (ref 0.41–5.90)

## 2015-01-05 ENCOUNTER — Telehealth: Payer: Self-pay | Admitting: *Deleted

## 2015-01-05 NOTE — Telephone Encounter (Signed)
Patient called and stated that he saw his oncologist at [facility] and they suggested thyroid medication due to thyroid test they did, TSH really low and they suggested him taking a smaller dose of thyroid medication. Patient is currently taking Nature Thyroid 130 and they suggested a smaller dosage. Patient is calling them to send [place] his records and labs.

## 2015-01-06 ENCOUNTER — Other Ambulatory Visit: Payer: Self-pay | Admitting: Internal Medicine

## 2015-01-06 DIAGNOSIS — E034 Atrophy of thyroid (acquired): Secondary | ICD-10-CM

## 2015-01-06 MED ORDER — THYROID 113.75 MG PO TABS: 1.0000 | ORAL_TABLET | Freq: Every day | ORAL | Status: DC

## 2015-01-06 NOTE — Telephone Encounter (Signed)
LMOM to return call.

## 2015-01-06 NOTE — Telephone Encounter (Signed)
I changed him to the 158mcg dosage which is what is indicated based on his weight. I faxed it to his pharmacy. I recommend he have only one provider monitor his thyroid to avoid confusion.

## 2015-01-06 NOTE — Telephone Encounter (Signed)
That's fine. I would prefer he take synthroid, but he does not agree to this and wants to continue nature throid.  Why his oncologist is measuring his thyroid, I'm not sure.  I had JUST done it.  I didn't change anything b/c he takes this unusual medication.

## 2015-01-06 NOTE — Telephone Encounter (Signed)
What dosage do you want to change patient to? Please Advise.

## 2015-01-08 NOTE — Telephone Encounter (Signed)
LMOM to return call.

## 2015-01-16 NOTE — Telephone Encounter (Signed)
Phone Number rings Busy times 2

## 2015-02-03 DIAGNOSIS — E039 Hypothyroidism, unspecified: Secondary | ICD-10-CM | POA: Diagnosis not present

## 2015-02-06 ENCOUNTER — Telehealth: Payer: Self-pay | Admitting: *Deleted

## 2015-02-06 NOTE — Telephone Encounter (Signed)
Note left on my desk from [place] to call [place] and tell her to have [facility] order the correct thyroid test for patient. Thyroid Panel was ordered but Dr. Mariea Clonts was wanting a Free T3 and Free T4. I called [facility] and spoke with Kennyth Lose [phone] and she placed order for correct labs and will send them to [place].

## 2015-02-11 ENCOUNTER — Telehealth: Payer: Self-pay

## 2015-02-11 DIAGNOSIS — E039 Hypothyroidism, unspecified: Secondary | ICD-10-CM

## 2015-02-11 NOTE — Telephone Encounter (Signed)
Called patient about thyroid test done 02/03/15, voice mail, left message I'll call him back tomorrow morning. Free T4 0.70, Free T3 2.1 patient on 113.75 Nature thyroid now daily Dr. Mariea Clonts wants to increase to 130.00 mcg daily. Recheck TSH free T3, free T4 in 6 weeks, that would be Tuesday 03/24/15

## 2015-02-13 NOTE — Telephone Encounter (Signed)
Spoke with patient about thyroid test. He had thyroid test done a month ago at [facility], they change dose to 130 mcg, which is what Dr. Mariea Clonts was going to change his Petra Kuba throid to. Per Dr. Mariea Clonts, we will wait until 03/24/15 and repeat TSH, free T3, free T4. Patient was not happy with his lab testing done, here at [facility], he has research about thyroid and type of testing that needs to be done and no one is doing all the testing that should be done. Per Dr Mariea Clonts, refer patient to Endocrinologist. This was find with the patient. Referral order done. Left message for patient that Earlie Server will let him know about the appointment.

## 2015-02-19 ENCOUNTER — Encounter: Payer: Self-pay | Admitting: Internal Medicine

## 2015-03-02 ENCOUNTER — Other Ambulatory Visit: Payer: Self-pay | Admitting: Internal Medicine

## 2015-03-05 DIAGNOSIS — E039 Hypothyroidism, unspecified: Secondary | ICD-10-CM | POA: Diagnosis not present

## 2015-03-05 DIAGNOSIS — R5383 Other fatigue: Secondary | ICD-10-CM | POA: Diagnosis not present

## 2015-04-01 DIAGNOSIS — Z23 Encounter for immunization: Secondary | ICD-10-CM | POA: Diagnosis not present

## 2015-04-01 DIAGNOSIS — Z85828 Personal history of other malignant neoplasm of skin: Secondary | ICD-10-CM | POA: Diagnosis not present

## 2015-04-01 DIAGNOSIS — D045 Carcinoma in situ of skin of trunk: Secondary | ICD-10-CM | POA: Diagnosis not present

## 2015-04-01 DIAGNOSIS — L570 Actinic keratosis: Secondary | ICD-10-CM | POA: Diagnosis not present

## 2015-04-01 DIAGNOSIS — D485 Neoplasm of uncertain behavior of skin: Secondary | ICD-10-CM | POA: Diagnosis not present

## 2015-04-01 DIAGNOSIS — L821 Other seborrheic keratosis: Secondary | ICD-10-CM | POA: Diagnosis not present

## 2015-04-01 DIAGNOSIS — D225 Melanocytic nevi of trunk: Secondary | ICD-10-CM | POA: Diagnosis not present

## 2015-04-08 DIAGNOSIS — M9904 Segmental and somatic dysfunction of sacral region: Secondary | ICD-10-CM | POA: Diagnosis not present

## 2015-04-08 DIAGNOSIS — M9905 Segmental and somatic dysfunction of pelvic region: Secondary | ICD-10-CM | POA: Diagnosis not present

## 2015-04-08 DIAGNOSIS — M5136 Other intervertebral disc degeneration, lumbar region: Secondary | ICD-10-CM | POA: Diagnosis not present

## 2015-04-08 DIAGNOSIS — M9903 Segmental and somatic dysfunction of lumbar region: Secondary | ICD-10-CM | POA: Diagnosis not present

## 2015-04-09 ENCOUNTER — Other Ambulatory Visit: Payer: Self-pay | Admitting: Internal Medicine

## 2015-04-15 ENCOUNTER — Encounter: Payer: Medicare Other | Admitting: Internal Medicine

## 2015-04-29 DIAGNOSIS — L570 Actinic keratosis: Secondary | ICD-10-CM | POA: Diagnosis not present

## 2015-04-30 DIAGNOSIS — M5136 Other intervertebral disc degeneration, lumbar region: Secondary | ICD-10-CM | POA: Diagnosis not present

## 2015-04-30 DIAGNOSIS — M9903 Segmental and somatic dysfunction of lumbar region: Secondary | ICD-10-CM | POA: Diagnosis not present

## 2015-04-30 DIAGNOSIS — M9905 Segmental and somatic dysfunction of pelvic region: Secondary | ICD-10-CM | POA: Diagnosis not present

## 2015-04-30 DIAGNOSIS — M9904 Segmental and somatic dysfunction of sacral region: Secondary | ICD-10-CM | POA: Diagnosis not present

## 2015-05-06 ENCOUNTER — Other Ambulatory Visit: Payer: Self-pay | Admitting: Internal Medicine

## 2015-05-06 ENCOUNTER — Encounter: Payer: Self-pay | Admitting: Internal Medicine

## 2015-05-06 ENCOUNTER — Non-Acute Institutional Stay: Payer: Medicare Other | Admitting: Internal Medicine

## 2015-05-06 VITALS — BP 130/70 | HR 58 | Temp 97.9°F | Ht 72.0 in | Wt 159.0 lb

## 2015-05-06 DIAGNOSIS — Z8719 Personal history of other diseases of the digestive system: Secondary | ICD-10-CM

## 2015-05-06 DIAGNOSIS — Z23 Encounter for immunization: Secondary | ICD-10-CM

## 2015-05-06 DIAGNOSIS — Z9889 Other specified postprocedural states: Secondary | ICD-10-CM | POA: Diagnosis not present

## 2015-05-06 DIAGNOSIS — G4700 Insomnia, unspecified: Secondary | ICD-10-CM | POA: Diagnosis not present

## 2015-05-06 DIAGNOSIS — E034 Atrophy of thyroid (acquired): Secondary | ICD-10-CM | POA: Diagnosis not present

## 2015-05-06 DIAGNOSIS — G4733 Obstructive sleep apnea (adult) (pediatric): Secondary | ICD-10-CM | POA: Diagnosis not present

## 2015-05-06 DIAGNOSIS — E038 Other specified hypothyroidism: Secondary | ICD-10-CM | POA: Diagnosis not present

## 2015-05-06 DIAGNOSIS — G4761 Periodic limb movement disorder: Secondary | ICD-10-CM | POA: Diagnosis not present

## 2015-05-06 NOTE — Progress Notes (Signed)
Patient ID: Philip [name], male   DOB: [birth year], 82 y.o.   MRN: [id number]   Location:  [facility] (12)  Provider: Latresa Gasser L. Mariea Clonts, D.O., C.M.D.  Code Status: DNR Goals of Care:  Advanced Directives 05/06/2015  Does patient have an advance directive? Yes  Type of Par[employer]ic of [place];Out of facility DNR (pink MOST or yellow form)  Copy of advanced directive(s) in chart? Yes  Pre-existing out of facility DNR order (yellow form or pink MOST form) Yellow form placed in chart (order not valid for inpatient use)     Chief Complaint  Patient presents with  . Medical Management of Chronic Issues    pain in lower groin area    HPI: Patient is a 82 y.o. male seen today for medical management of chronic diseases and a concern about pain in his lower groin area.  Going on for a few months.  No pain at this time.  Had a hernia repaired 17 years ago on right and was concerned something was coming loose.  Kind of an ache across the suprapubic region.  Had lifted a heavy item before it began.  Has not noted a correlation with bowel movements at all.  No blood in stools or dark stools.  Pain will occur if he is sleeping on his abdomen.  It's slight.  Rates 2-3/10.    Had the UV treatment on his face last week and now it's peeling.  Dr. Odessa Fleming office.    Hypothyroidism:  Is now seeing endocrinology, Dr. Soyla Murphy.  She reduced his nature-throid slightly.  Wants TSH, cortisol done and he has prescription to get those done.    Angelita Ingles asks about getting him a new sleep study.  Tried different masks over a year.  Wound up with a dual pressure machine and a mask that fit, it worked perfectly but it didn't make any difference.  Had previously slept through the night and woken up tired, but after that year, he started waking up early am and not getting back to sleep.  They have a sleep tracker.  His wife also reports his legs jerking.   I see an office visit from Dr. Gwenette Greet for periodic limb movements.  Pt reports the magnesium helps.    Had shingles on his left maxillary region of his face several years ago which was misdiagnosed as poison oak.  Has not gotten vaccinated to prevent recurrence.  Past Medical History  Diagnosis Date  . Urethral stricture   . Urinary retention with incomplete bladder emptying   . History of prostate cancer DX  2002    S/P EXTERNAL RADIATION/ RADIOACTIVE SEED IMPLANTS  2003  . OSA (obstructive sleep apnea) intolerant cpap  . Self-catheterizes urinary bladder     QID   AND   PRN  . HTN (hypertension)   . Saliva decreased   . S/P radiation therapy ended 01-26-2012       for tonsillar cancer  . Restless legs syndrome (RLS) 10/31/2011  . Nocturia 08/08/2011  . Vitamin B 12 deficiency 01/28/2011  . Vitamin D deficiency   . Hyperlipemia   . DDD (degenerative disc disease)   . History of prostate cancer     S/P  RADIACTIVE PROSTATE SEED IMPLANTS 2003  . Bladder neck contracture   . History of squamous cell carcinoma excision     2012--  RIGHT LOWER EXTREM  . Hypothyroidism   . BPH (benign prostatic  hyperplasia)   . GERD (gastroesophageal reflux disease)   . Full dentures   . Tonsillar cancer ([place]) 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 ---- dx oct 2013  ----s/p radiation/  ended 01-26-2012-- no surgical intervention---  residuals ( dry mouth, decreased saliva)    oncologist at [facility]--  dr brizel--  HX OF -- NO RECURRENCE  . Macular degeneration     Past Surgical History  Procedure Laterality Date  . Radioactive seed implants, prostate  JAN  2003  . Cysto/ balloon dilation of  urethral stricture  12-25-2010  . Cystoscopy with urethral dilatation  05/31/2011    Procedure: CYSTOSCOPY WITH URETHRAL DILATATION;  Surgeon: Reece Packer, MD;  Location: [facility];  Service: Urology;  Laterality: N/A;  BALLOON DILATION  . Cataract extraction w/ intraocular  lens  implant, bilateral    . Cystoscopy/retrograde/ureteroscopy N/A 05/22/2012    Procedure: CYSTOSCOPY BALLOON DILATION RETROGRADE URETEROGRAM ;  Surgeon: Reece Packer, MD;  Location: [facility];  Service: Urology;  Laterality: N/A;  . Skin lesion excision  07/2012    MOST  right shoulder  . Inguinal hernia repair Right 2000  . Cytso/  dilatation urethral stricture/  bx prostatic urethra/  removal foreign bodies  06-25-2010   [facility]  . Balloon dilation N/A 11/10/2014    Procedure: CYSTO BALLOON DILATION AND RETROGRADE URETHROGRAM ;  Surgeon: Bjorn Loser, MD;  Location: [facility];  Service: Urology;  Laterality: N/A;    Allergies  Allergen Reactions  . Cephalosporins Rash  . Suprax [Cefixime] Rash      Medication List       This list is accurate as of: 05/06/15  8:44 AM.  Always use your most recent med list.               ASHWAGANDHA PO  Take by mouth daily.     buPROPion 100 MG tablet  Commonly known as:  WELLBUTRIN  TAKE ONE TABLET BY MOUTH TWICE DAILY     CALCIUM PO  Take 800 mg by mouth daily. TWO  400 MG TAB     COLLAGEN PO  Take 600 mg by mouth 2 (two) times daily. Take 600 mg twice daily.     COLOSTRUM PO  Take by mouth. One teaspoon daily 1 oz of water     CoQ10 100 MG Caps  Take 100 mg by mouth daily. Take one daily     DIGESTIVE ENZYMES PO  Take 500 mg by mouth every morning.     fish oil-omega-3 fatty acids 1000 MG capsule  Take 2 g by mouth daily.     GLUCOSAMINE-CHONDROITIN-MSM PO  Take 500 mg by mouth 2 (two) times daily. Take 500 mg twice daily.     HYALURONIC ACID PO  Take by mouth. Take 100mg  twice daily for joinits     lisinopril 10 MG tablet  Commonly known as:  PRINIVIL,ZESTRIL  Take 1 tablet (10 mg total) by mouth daily.     Methylcobalamin 1 MG Chew  One dally for B12 supplement     multivitamins ther. w/minerals Tabs tablet  Take 2 tablets by mouth 2 (two) times daily.     NATURAL  VITAMIN E 400 UNIT capsule  Generic drug:  vitamin E  Take 400 Units by mouth daily.     RESVERATROL PO  Take 100 mg by mouth 2 (two) times daily. Take 2 100 mg tablets twice daily.  Thyroid 113.75 MG Tabs  Commonly known as:  NATURE-THROID  Take 1 tablet by mouth daily before breakfast. At least 30 mins before breakfast or any other medications     TURMERIC CURCUMIN PO  Take 650 mg by mouth every morning.     vitamin C 1000 MG tablet  Take 1,000 mg by mouth daily.     Vitamin D3 5000 units Caps  Take by mouth.        Review of Systems:  Review of Systems  Constitutional: Negative for activity change, appetite change and unexpected weight change.  Respiratory: Negative for cough and shortness of breath.   Cardiovascular: Negative for chest pain and leg swelling.  Gastrointestinal: Positive for abdominal pain. Negative for nausea, vomiting, diarrhea, constipation, blood in stool, abdominal distention, anal bleeding and rectal pain.  Genitourinary: Negative for difficulty urinating.  Musculoskeletal: Negative for arthralgias.  Skin:       S/p UV treatment at dermatology  Neurological: Negative for dizziness, weakness and light-headedness.  Hematological: Negative for adenopathy.  Psychiatric/Behavioral: Negative for behavioral problems and decreased concentration.    Health Maintenance  Topic Date Due  . ZOSTAVAX  02/25/1994  . PNA vac Low Risk Adult (2 of 2 - PCV13) 01/18/2004  . TETANUS/TDAP  01/17/2013  . INFLUENZA VACCINE  08/18/2015    Physical Exam: Filed Vitals:   05/06/15 0835  BP: 130/70  Pulse: 58  Temp: 97.9 F (36.6 C)  TempSrc: Oral  Height: 6' (1.829 m)  Weight: 159 lb (72.122 kg)  SpO2: 98%   Body mass index is 21.56 kg/(m^2). Physical Exam  Constitutional: He is oriented to person, place, and time. He appears well-developed and well-nourished. No distress.  Cardiovascular: Normal rate, regular rhythm, normal heart sounds and intact distal  pulses.   Pulmonary/Chest: Effort normal and breath sounds normal. No respiratory distress.  Abdominal: Soft. Bowel sounds are normal.  Just slight prominence of right groin area vs. Left; no tenderness, scar present from prior hernia repair  Musculoskeletal: Normal range of motion.  Neurological: He is alert and oriented to person, place, and time.  Skin:  Red peeling skin of face s/p UV treatment  Psychiatric: He has a normal mood and affect.    Labs reviewed: Basic Metabolic Panel:  Recent Labs  07/01/14 2251 09/18/14 11/10/14 1115 12/18/14 12/30/14  NA 135 145 140 139  --   K 4.1 4.2 4.2 4.6  --   CL 105  --   --   --   --   CO2 22  --   --   --   --   GLUCOSE 120*  --  95  --   --   BUN 26* 27*  --  27*  --   CREATININE 1.59* 1.6*  --  1.6*  --   CALCIUM 8.4*  --   --   --   --   TSH  --  0.15*  --  0.06* 0.04*   Liver Function Tests:  Recent Labs  07/01/14 2251 09/18/14 12/18/14  AST 17 15 15   ALT 14* 12 13  ALKPHOS 69 61 71  BILITOT 1.0  --   --   PROT 7.3  --   --   ALBUMIN 3.5  --   --     Recent Labs  07/01/14 2251  LIPASE 27   No results for input(s): AMMONIA in the last 8760 hours. CBC:  Recent Labs  07/01/14 2251 09/18/14 11/10/14 1115 12/18/14  WBC 9.8  7.0  --  8.5  NEUTROABS 8.0*  --   --   --   HGB 12.7* 13.4* 13.9 13.7  HCT 37.0* 41 41.0 41  MCV 90.7  --   --   --   PLT 196 213  --  262   Lipid Panel:  Recent Labs  12/18/14  CHOL 221*  HDL 59  LDLCALC 148  TRIG 97   No results found for: HGBA1C   Assessment/Plan 1. History of right inguinal hernia repair -had some swelling and discomfort in his right groin region after some heavy lifting -swelling has resolved, but still with some intermittent discomfort -discussed monitoring at this time, but if swelling returns or pain worsens, would refer to general surgery ([facility])  2. Hypothyroidism due to acquired atrophy of thyroid -following now with Dr. Soyla Murphy -she has requested he  get some labwork drawn here at [facility] and he will bring the prescription by the clinic with the orders so they can be requested  3. OSA (obstructive sleep apnea) - not currently using cpap--had found a reasonable mask, etc, but says that he did not notice any benefit with the variable cpap he had a few years ago and wants to see someone else about this (previously saw [facility]--notes in epic) - Ambulatory referral to Sleep Studies  4. Insomnia -due to #3 I suspect -needs f/u on this and his wife encouraged him to follow through on this today  5. Periodic limb movement disorder -previously noted by [facility] during their sleep study and wife reports this -he thinks magnesium helps it  6.  Need for vaccination with 13-polyvalent pneumococcal conjugate vaccine -prevnar given after much encouragement  Could not convince him to get zostavax despite an awful case of shingles on his face in the past--will try again at his physical--would have to get done at pharmacy.  Labs/tests ordered:  Labs from Dr. Soyla Murphy Next appt:  Next available physical as he missed his annual   [place]. Glory Graefe, D.O. [facility] [address],  [address] Cell Phone (Mon-Fri 8am-5pm):  [phone] On Call:  [phone] & follow prompts after 5pm & weekends Office Phone:  [phone] Office Fax:  [phone]

## 2015-05-06 NOTE — Addendum Note (Signed)
Addended by: Despina Hidden on: 05/06/2015 09:31 AM   Modules accepted: Orders

## 2015-05-10 ENCOUNTER — Other Ambulatory Visit: Payer: Self-pay | Admitting: Internal Medicine

## 2015-05-12 ENCOUNTER — Ambulatory Visit (INDEPENDENT_AMBULATORY_CARE_PROVIDER_SITE_OTHER): Payer: Medicare Other | Admitting: Neurology

## 2015-05-12 ENCOUNTER — Encounter: Payer: Self-pay | Admitting: Neurology

## 2015-05-12 VITALS — BP 142/82 | HR 72 | Resp 16 | Ht 73.0 in | Wt 161.0 lb

## 2015-05-12 DIAGNOSIS — G4733 Obstructive sleep apnea (adult) (pediatric): Secondary | ICD-10-CM

## 2015-05-12 DIAGNOSIS — R5383 Other fatigue: Secondary | ICD-10-CM | POA: Diagnosis not present

## 2015-05-12 DIAGNOSIS — E039 Hypothyroidism, unspecified: Secondary | ICD-10-CM | POA: Diagnosis not present

## 2015-05-12 DIAGNOSIS — G4761 Periodic limb movement disorder: Secondary | ICD-10-CM

## 2015-05-12 DIAGNOSIS — G2581 Restless legs syndrome: Secondary | ICD-10-CM

## 2015-05-12 LAB — TSH: TSH: 0.57 u[IU]/mL (ref 0.41–5.90)

## 2015-05-12 LAB — CORTISOL: Cortisol: 13.1

## 2015-05-12 NOTE — Progress Notes (Signed)
Subjective:    Patient ID: [id number] ENT is a 82 y.o. male.  HPI     Star Age, MD, PhD [facility] [address] [place],  [address]  Dear Dr. Mariea Clonts,   I saw your patient, Philip [name], upon your kind request, in my neurologic clinic today for initial consultation of his sleep disorder, in particular, evaluation of his prior diagnosis of obstructive sleep apnea and Symptoms of restless leg syndrome and PLMD. The patient is accompanied by his wife today. As you know, Philip [name] is an 82 year old right-handed gentleman with an underlying complex medical history of prostate cancer, status post seed implants, hypertension, tonsillar cancer, status post XRT, restless leg syndrome, vitamin D deficiency, vitamin B12 deficiency, hyperlipidemia, degenerative disc disease, squamous cell cancer, hypothyroidism, reflux disease, macular degeneration, who was previously diagnosed with obstructive sleep apnea. I reviewed his CPAP titration study from 02/25/2008, interpreted by Dr. Gwenette Greet. Sleep efficiency was 89.4%, sleep latency was 13.5 minutes, REM latency was 102 minutes. CPAP was titrated from 4 cm to 11 cm. PLM index was 69.7/h.  He had a baseline sleep study on 01/27/2008. This was interpreted by Dr. Mickeal Skinner and I reviewed the results. AHI was 18.6 per hour. Average oxygen saturation was 89%, nadir was 85%. PLM index was 24.6 per hour. Sleep efficiency was 76.6%, sleep latency 7 minutes, REM latency 162.5 minutes.   His main complaint about his sleep is nonrestorative sleep and not being able to sleep through the night with at times multiple nighttime awakenings. He does not have significant difficulty falling asleep. Originally, in 2010 when he had a sleep study evaluation he was having issues with anxiety and his wife had noted pauses in his breathing at night. He struggled with the CPAP for about a year. He tried different masks and even  was tried on BiPAP as I understand for about a week and could not tolerate it either. He was about 165 pounds at the time of his sleep study testing, then lost a lot of weight, and gain some back, maintaining now between higher 150s and lower 160s typically. He has no family history of OSA or restless leg syndrome. His leg twitching and caking can be quite vehement at night and is almost nightly per wife. He takes magnesium over-the-counter for this but not every night and feels it has helped some but would be willing to try a dopamine agonists and did some research on Requip.  He is a retired Insurance underwriter and worked for [employer] for about 30 years and has also been in [employer]. He spent some time in [place] and neuropathy. He has 4 grown children. He lives with his wife. He has 5 grandchildren. He drinks alcohol in the form of wine, 1-2 glasses per night, he is a never smoker and does not use any illicit drugs. Caffeine is in the form of coffee, usually 2 cups per day in the morning. He does not drink sodas.  He does not take any sleep aids. He likes to take over-the-counter supplements or natural medications rather than prescription medications he admits. He has his radiology oncology follow-up every 6 months at [facility] for his tonsillar cancer.  He self catheterizes, does not have to catheterize in the middle of the night. He denies morning headaches. His Epworth sleepiness score is 3 out of 24 today, his fatigue score is 31 out of 63. He does not typically take a nap. His bedtime is  around 10 PM, rise time is around 7:30 AM, he feels marginally or adequately rested on most mornings. He has occasional leg cramps.  His Past Medical History Is Significant For: Past Medical History  Diagnosis Date  . Urethral stricture   . Urinary retention with incomplete bladder emptying   . History of prostate cancer DX  2002    S/P EXTERNAL RADIATION/ RADIOACTIVE SEED IMPLANTS  2003  . OSA (obstructive sleep  apnea) intolerant cpap  . Self-catheterizes urinary bladder     QID   AND   PRN  . HTN (hypertension)   . Saliva decreased   . S/P radiation therapy ended 01-26-2012       for tonsillar cancer  . Restless legs syndrome (RLS) 10/31/2011  . Nocturia 08/08/2011  . Vitamin B 12 deficiency 01/28/2011  . Vitamin D deficiency   . Hyperlipemia   . DDD (degenerative disc disease)   . History of prostate cancer     S/P  RADIACTIVE PROSTATE SEED IMPLANTS 2003  . Bladder neck contracture   . History of squamous cell carcinoma excision     2012--  RIGHT LOWER EXTREM  . Hypothyroidism   . BPH (benign prostatic hyperplasia)   . GERD (gastroesophageal reflux disease)   . Full dentures   . Tonsillar cancer ([place]) 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 ---- dx oct 2013  ----s/p radiation/  ended 01-26-2012-- no surgical intervention---  residuals ( dry mouth, decreased saliva)    oncologist at [facility]--  dr brizel--  HX OF -- NO RECURRENCE  . Macular degeneration     His Past Surgical History Is Significant For: Past Surgical History  Procedure Laterality Date  . Radioactive seed implants, prostate  JAN  2003  . Cysto/ balloon dilation of  urethral stricture  12-25-2010  . Cystoscopy with urethral dilatation  05/31/2011    Procedure: CYSTOSCOPY WITH URETHRAL DILATATION;  Surgeon: Reece Packer, MD;  Location: [facility];  Service: Urology;  Laterality: N/A;  BALLOON DILATION  . Cataract extraction w/ intraocular lens  implant, bilateral    . Cystoscopy/retrograde/ureteroscopy N/A 05/22/2012    Procedure: CYSTOSCOPY BALLOON DILATION RETROGRADE URETEROGRAM ;  Surgeon: Reece Packer, MD;  Location: [facility];  Service: Urology;  Laterality: N/A;  . Skin lesion excision  07/2012    MOST  right shoulder  . Inguinal hernia repair Right 2000  . Cytso/  dilatation urethral stricture/  bx prostatic urethra/  removal foreign bodies  06-25-2010   [facility]  .  Balloon dilation N/A 11/10/2014    Procedure: CYSTO BALLOON DILATION AND RETROGRADE URETHROGRAM ;  Surgeon: Bjorn Loser, MD;  Location: [facility];  Service: Urology;  Laterality: N/A;    His Family History Is Significant For: Family History  Problem Relation Age of Onset  . Stroke Father     His Social History Is Significant For: Social History   Social History  . Marital Status: Married    Spouse Name: N/A  . Number of Children: 4  . Years of Education: college   Occupational History  . retired    Social History Main Topics  . Smoking status: Never Smoker   . Smokeless tobacco: Never Used  . Alcohol Use: 4.2 oz/week    7 Glasses of wine per week     Comment: 8oz  . Drug Use: No  . Sexual Activity: Not Asked   Other Topics Concern  . 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     His Allergies Are:  Allergies  Allergen Reactions  . Cephalosporins Rash  . Suprax [Cefixime] Rash  :   His Current Medications Are:  Outpatient Encounter Prescriptions as of 05/12/2015  Medication Sig  . Ascorbic Acid (VITAMIN C) 1000 MG tablet Take 1,000 mg by mouth daily.  . ASHWAGANDHA PO Take by mouth daily.  Marland Kitchen buPROPion (WELLBUTRIN) 100 MG tablet TAKE ONE TABLET BY MOUTH TWICE DAILY  . CALCIUM PO Take 800 mg by mouth daily. TWO  400 MG TAB  . Cholecalciferol (VITAMIN D3) 5000 UNITS CAPS Take by mouth.  . Coenzyme Q10 (COQ10) 100 MG CAPS Take 100 mg by mouth daily. Take one daily  . COLLAGEN PO Take 600 mg by mouth 2 (two) times daily. Take 600 mg twice daily.  . COLOSTRUM PO Take by mouth. One teaspoon daily 1 oz of water  . cyanocobalamin 100 MCG tablet Take 100 mcg by mouth daily.  Marland Kitchen DIGESTIVE ENZYMES PO Take 500 mg by mouth every morning.   . fish oil-omega-3 fatty acids 1000 MG capsule Take 2 g by mouth daily.  Marland Kitchen GLUCOSAMINE-CHONDROITIN-MSM PO Take  500 mg by mouth 2 (two) times daily. Take 500 mg twice daily.  Marland Kitchen Hyaluronic Acid-Vitamin C (HYALURONIC ACID PO) Take by mouth. Take 100mg  twice daily for joinits  . lisinopril (PRINIVIL,ZESTRIL) 10 MG tablet TAKE ONE TABLET BY MOUTH ONCE DAILY.  Marland Kitchen Methylcobalamin 1 MG CHEW One dally for B12 supplement (Patient taking differently: Chew 5,000 Units by mouth daily. One dally for B12 supplement)  . Multiple Vitamins-Minerals (MULTIVITAMINS THER. W/MINERALS) TABS Take 2 tablets by mouth 2 (two) times daily.   Marland Kitchen RESVERATROL PO Take 100 mg by mouth 2 (two) times daily. Take 2 100 mg tablets twice daily.  . Thyroid (NATURE-THROID) 113.75 MG TABS Take 1 tablet by mouth daily before breakfast. At least 30 mins before breakfast or any other medications  . TURMERIC CURCUMIN PO Take 650 mg by mouth every morning.  . vitamin E (NATURAL VITAMIN E) 400 UNIT capsule Take 400 Units by mouth daily.  . [DISCONTINUED] lisinopril (PRINIVIL,ZESTRIL) 10 MG tablet Take 1 tablet (10 mg total) by mouth daily. (Patient taking differently: Take 10 mg by mouth every morning. )   No facility-administered encounter medications on file as of 05/12/2015.  :  Review of Systems:  Out of a complete 14 point review of systems, all are reviewed and negative with the exception of these symptoms as listed below:  Review of Systems  Neurological:       Patient had sleep study in 2010. States that he was put on CPAP but did not do well with it.  Patient has trouble staying asleep, witnessed apnea, patient's fit bit states that he snores 2-3 min a night, wakes up feeling tired, seldom takes naps, a lot of leg twitching at night.   Epworth Sleepiness Scale 0= would never doze 1= slight chance of dozing 2= moderate chance of dozing 3= high chance of dozing  Sitting and reading:0 Watching TV:1 Sitting inactive in a public place (ex. Theater or meeting):0 As a passenger in a car for an hour without a break:0 Lying down to rest in the  afternoon:2 Sitting and talking to someone:0 Sitting quietly after lunch (no alcohol):0 In a car, while stopped in traffic:0 Total:3   Objective:  Neurologic  Exam  Physical Exam Physical Examination:   Filed Vitals:   05/12/15 1112  BP: 142/82  Pulse: 72  Resp: 16    General Examination: The patient is a very pleasant 82 y.o. male in no acute distress. He appears well-developed and well-nourished and very well groomed. He is slender.  HEENT: Normocephalic, atraumatic, pupils are equal, round and reactive to light and accommodation. Funduscopic exam is normal with sharp disc margins noted. Extraocular tracking is good without limitation to gaze excursion or nystagmus noted. Normal smooth pursuit is noted. Hearing is grossly intact. Tympanic membranes are clear bilaterally. Face is symmetric with normal facial animation and normal facial sensation. Speech is clear with no dysarthria noted. There is no hypophonia. There is no lip, neck/head, jaw or voice tremor. Neck is supple with full range of passive and active motion. There are no carotid bruits on auscultation. Oropharynx exam reveals: moderate mouth dryness, adequate dental hygiene with partial plate in place and mild airway crowding, due to redundant soft palate and smaller airway opening. Mallampati is class III. Tongue protrudes centrally and palate elevates symmetrically. Tonsils are small/absent. Neck size is 15.25 inches. He has a Mild overbite.   Chest: Clear to auscultation without wheezing, rhonchi or crackles noted.  Heart: S1+S2+0, regular and normal without murmurs, rubs or gallops noted.   Abdomen: Soft, non-tender and non-distended with normal bowel sounds appreciated on auscultation.  Extremities: There is trace pitting edema in the distal lower extremities bilaterally, around both ankles. Pedal pulses are intact.  Skin: Warm and dry without trophic changes noted. There are no varicose veins.  Musculoskeletal: exam  reveals no obvious joint deformities, tenderness or joint swelling or erythema.   Neurologically:  Mental status: The patient is awake, alert and oriented in all 4 spheres. His immediate and remote memory, attention, language skills and fund of knowledge are appropriate. There is no evidence of aphasia, agnosia, apraxia or anomia. Speech is clear with normal prosody and enunciation. Thought process is linear. Mood is normal and affect is normal.  Cranial nerves II - XII are as described above under HEENT exam. In addition: shoulder shrug is normal with equal shoulder height noted. Motor exam: Normal bulk, strength and tone is noted. There is no drift, tremor or rebound. Romberg is negative. Reflexes are 1-2+ throughout. Babinski: Toes are flexor bilaterally. Fine motor skills and coordination: intact with normal finger taps, normal hand movements, normal rapid alternating patting, normal foot taps and normal foot agility.  Cerebellar testing: No dysmetria or intention tremor on finger to nose testing. Heel to shin is unremarkable bilaterally. There is no truncal or gait ataxia.  Sensory exam: intact to light touch, pinprick, vibration, temperature sense in the upper and lower extremities.  Gait, station and balance: He stands easily. No veering to one side is noted. No leaning to one side is noted. Posture is age-appropriate and stance is narrow based. Gait shows normal stride length and normal pace. No problems turning are noted. He turns en bloc. Tandem walk is unremarkable.  Assessment and Plan:  In summary, BRITNEY BETT is a very pleasant 82 y.o.-year old male with an underlying complex medical history of prostate cancer, status post seed implants, hypertension, tonsillar cancer, status post XRT, restless leg syndrome, vitamin D deficiency, vitamin B12 deficiency, hyperlipidemia, degenerative disc disease, squamous cell cancer, hypothyroidism, reflux disease, macular degeneration, who Was  previously diagnosed with obstructive sleep apnea and presents for initial consultation of his sleep disturbance, including sleep disruption, restless leg  symptoms and PLMS. He tried CPAP several years ago but could not tolerate it at the time. He may still have underlying obstructive sleep disordered breathing. Furthermore, we should proceed with sleep study testing for evaluation of PLMS and other causes of his sleep disruption.  I had a long chat with the patient and his wife about my findings and the diagnosis of OSA, PLMs, and RLS, the prognosis and treatment options. We talked about medical treatments, surgical interventions and non-pharmacological approaches. I explained in particular the risks and ramifications of untreated moderate to severe OSA, especially with respect to developing cardiovascular disease down the Road, including congestive heart failure, difficult to treat hypertension, cardiac arrhythmias, or stroke. Even type 2 diabetes has, in part, been linked to untreated OSA. Symptoms of untreated OSA include daytime sleepiness, memory problems, mood irritability and mood disorder such as depression and anxiety, lack of energy, as well as recurrent headaches, especially morning headaches. We talked about trying to maintain a healthy lifestyle in general, as well as the importance of weight control. I encouraged the patient to eat healthy, exercise daily and keep well hydrated, to keep a scheduled bedtime and wake time routine, to not skip any meals and eat healthy snacks in between meals. I advised the patient not to drive when feeling sleepy. I recommended the following at this time: sleep study with potential positive airway pressure titration. (We will score hypopneas at 4% and split the sleep study into diagnostic and treatment portion, if the estimated. 2 hour AHI is >15/h).   We will pick up our discussion about symptomatic management of restless leg syndrome and PLMS after the sleep  study.  I explained the sleep test procedure to the patient and also outlined possible surgical and non-surgical treatment options of OSA, including the use of a custom-made dental device (which would require a referral to a specialist dentist or oral surgeon), upper airway surgical options, such as pillar implants, radiofrequency surgery, tongue base surgery, and UPPP (which would involve a referral to an ENT surgeon). Rarely, jaw surgery such as mandibular advancement may be considered.  I also explained the CPAP treatment option to the patient, who indicated that he would be willing to try CPAP if the need arises. I explained the importance of being compliant with PAP treatment, not only for insurance purposes but primarily to improve His symptoms, and for the patient's long term health benefit, including to reduce His cardiovascular risks. I answered all their questions today and the patient and his wife were in agreement. I would like to see him back after the sleep study is completed and encouraged him to call with any interim questions, concerns, problems or updates.   Thank you very much for allowing me to participate in the care of this nice patient. If I can be of any further assistance to you please do not hesitate to call me at [phone].  Sincerely,   Star Age, MD, PhD

## 2015-05-12 NOTE — Patient Instructions (Addendum)
Based on your symptoms and your exam I believe you may still be at risk for obstructive sleep apnea or OSA, and I think we should proceed with a sleep study to determine whether you do or do not have OSA and how severe it is and to look for leg twitching or PLMs. If you have more than mild OSA, I want you to consider treatment with CPAP. Please remember, the risks and ramifications of moderate to severe obstructive sleep apnea or OSA are: Cardiovascular disease, including congestive heart failure, stroke, difficult to control hypertension, arrhythmias, and even type 2 diabetes has been linked to untreated OSA. Sleep apnea causes disruption of sleep and sleep deprivation in most cases, which, in turn, can cause recurrent headaches, problems with memory, mood, concentration, focus, and vigilance. Most people with untreated sleep apnea report excessive daytime sleepiness, which can affect their ability to drive. Please do not drive if you feel sleepy.   I will likely see you back after your sleep study to go over the test results and where to go from there. We will call you after your sleep study to advise about the results (most likely, you will hear from Beverlee Nims, my nurse) and to set up an appointment at the time, as necessary.    Our sleep lab administrative assistant, Arrie Aran will meet with you or call you to schedule your sleep study. If you don't hear back from her by next week please feel free to call her at [phone]. This is her direct line and please leave a message with your phone number to call back if you get the voicemail box. She will call back as soon as possible.

## 2015-05-15 ENCOUNTER — Encounter: Payer: Self-pay | Admitting: Internal Medicine

## 2015-05-18 DIAGNOSIS — M47816 Spondylosis without myelopathy or radiculopathy, lumbar region: Secondary | ICD-10-CM | POA: Diagnosis not present

## 2015-05-18 DIAGNOSIS — M545 Low back pain: Secondary | ICD-10-CM | POA: Diagnosis not present

## 2015-05-18 DIAGNOSIS — M9903 Segmental and somatic dysfunction of lumbar region: Secondary | ICD-10-CM | POA: Diagnosis not present

## 2015-05-18 DIAGNOSIS — M5136 Other intervertebral disc degeneration, lumbar region: Secondary | ICD-10-CM | POA: Diagnosis not present

## 2015-05-19 DIAGNOSIS — M47816 Spondylosis without myelopathy or radiculopathy, lumbar region: Secondary | ICD-10-CM | POA: Diagnosis not present

## 2015-05-19 DIAGNOSIS — M545 Low back pain: Secondary | ICD-10-CM | POA: Diagnosis not present

## 2015-05-19 DIAGNOSIS — M9903 Segmental and somatic dysfunction of lumbar region: Secondary | ICD-10-CM | POA: Diagnosis not present

## 2015-05-19 DIAGNOSIS — M5136 Other intervertebral disc degeneration, lumbar region: Secondary | ICD-10-CM | POA: Diagnosis not present

## 2015-05-20 ENCOUNTER — Encounter: Payer: Self-pay | Admitting: Internal Medicine

## 2015-05-20 ENCOUNTER — Non-Acute Institutional Stay: Payer: Medicare Other | Admitting: Internal Medicine

## 2015-05-20 VITALS — BP 130/80 | HR 60 | Temp 97.8°F | Ht 73.0 in | Wt 161.0 lb

## 2015-05-20 DIAGNOSIS — E038 Other specified hypothyroidism: Secondary | ICD-10-CM

## 2015-05-20 DIAGNOSIS — G4761 Periodic limb movement disorder: Secondary | ICD-10-CM | POA: Diagnosis not present

## 2015-05-20 DIAGNOSIS — R1030 Lower abdominal pain, unspecified: Secondary | ICD-10-CM | POA: Diagnosis not present

## 2015-05-20 DIAGNOSIS — M79605 Pain in left leg: Secondary | ICD-10-CM | POA: Diagnosis not present

## 2015-05-20 DIAGNOSIS — Z8719 Personal history of other diseases of the digestive system: Secondary | ICD-10-CM | POA: Diagnosis not present

## 2015-05-20 DIAGNOSIS — E034 Atrophy of thyroid (acquired): Secondary | ICD-10-CM | POA: Diagnosis not present

## 2015-05-20 DIAGNOSIS — H532 Diplopia: Secondary | ICD-10-CM | POA: Diagnosis not present

## 2015-05-20 DIAGNOSIS — Z0000 Encounter for general adult medical examination without abnormal findings: Secondary | ICD-10-CM | POA: Diagnosis not present

## 2015-05-20 DIAGNOSIS — Z9889 Other specified postprocedural states: Secondary | ICD-10-CM | POA: Diagnosis not present

## 2015-05-20 DIAGNOSIS — Z961 Presence of intraocular lens: Secondary | ICD-10-CM | POA: Diagnosis not present

## 2015-05-20 DIAGNOSIS — M79604 Pain in right leg: Secondary | ICD-10-CM | POA: Diagnosis not present

## 2015-05-20 DIAGNOSIS — I10 Essential (primary) hypertension: Secondary | ICD-10-CM

## 2015-05-20 DIAGNOSIS — H353132 Nonexudative age-related macular degeneration, bilateral, intermediate dry stage: Secondary | ICD-10-CM | POA: Diagnosis not present

## 2015-05-20 DIAGNOSIS — H5211 Myopia, right eye: Secondary | ICD-10-CM | POA: Diagnosis not present

## 2015-05-20 NOTE — Progress Notes (Signed)
Patient ID: Philip [name], male   DOB: May 03, 1932, 82 y.o.   MRN: [id number]   Location:  [facility] (12) Provider: Chayanne Filippi L. Mariea Clonts, D.O., C.M.D.  Patient Care Team: Gayland Curry, DO as PCP - General (Geriatric Medicine) Bjorn Loser, MD as Consulting Physician (Urology) [facility] Bess Harvest, MD (Radiation Oncology) Jacelyn Pi, MD as Consulting Physician (Endocrinology)  Extended Emergency Contact Information Primary Emergency Contact: Landi,Roberta Address: [address]          [place], [place] [address] Johnnette Litter of [place] Phone: [phone] Relation: Spouse Secondary Emergency Contact: Bjorn Loser, [place] [address] Johnnette Litter of [place] Phone: [phone] Mobile Phone: [phone] Relation: Daughter  Code Status: DNR Goals of Care: Advanced Directive information Advanced Directives 05/20/2015  Does patient have an advance directive? Yes  Type of Paramedic of [place];Out of facility DNR (pink MOST or yellow form)  Copy of advanced directive(s) in chart? Yes  Pre-existing out of facility DNR order (yellow form or pink MOST form) Yellow form placed in chart (order not valid for inpatient use)   Chief Complaint  Patient presents with  . Annual Exam    wellness exam  . MMSE    30/30 passed clock drawing    HPI: Patient is a 82 y.o. male seen in today for an annual wellness exam and med mgt of his chronic illnesses.    Depression screen PHQ 2/9 05/06/2015  Decreased Interest 0  Down, Depressed, Hopeless 0  PHQ - 2 Score 0    Fall Risk  05/06/2015  Falls in the past year? No   MMSE - Mini Mental State Exam 05/20/2015  Orientation to time 5  Orientation to Place 5  Registration 3  Attention/ Calculation 5  Recall 3  Language- name 2 objects 2  Language- repeat 1  Language- follow 3 step command 3  Language-  read & follow direction 1  Write a sentence 1  Copy design 1  Total score 30  passed clock  Health Maintenance  Topic Date Due  . ZOSTAVAX  02/25/1994  . TETANUS/TDAP  01/17/2013  . INFLUENZA VACCINE  08/18/2015  . PNA vac Low Risk Adult  Completed   Urinary incontinence?  No problems.   Functional Status Survey: Is the patient deaf or have difficulty hearing?: No Does the patient have difficulty seeing, even when wearing glasses/contacts?: No Does the patient have difficulty concentrating, remembering, or making decisions?: No Does the patient have difficulty walking or climbing stairs?: No Does the patient have difficulty dressing or bathing?: No Does the patient have difficulty doing errands alone such as visiting a doctor's office or shopping?: No Exercise?  Yoga 2x per week, playing table tennis 6-7hrs per week Diet?  Eats as healthy as possible--a lot of fish, not much meat Vision Screening Comments: Vision done 05/20/15  Hearing:  Thinks he needs to get an earwax check Dentition:  No current, last saw dentist for cleaning a month or so ago Pain:  Reports that when he walks especially in the mornings, his legs ache.  Walking up here, they ached the whole time.  Used to walk fully around campus w/o problems.  It's an ache--hasn't nailed down where it is.  Has been for the past year or so.    Re: periodic limb movements and sleep apnea:  Has sleep study coming  up.  Did see Dr. Rexene Alberts on 4/25.  Note reviewed.  Also sees surgery upcoming for his right inguinal hernia which was previously repaired and has caused him some lower abdominal pain.  He reports the right groin is not problematic right now, but he's still had some pain in his lower abdomen at night when he's lying on his stomach.  Past Medical History  Diagnosis Date  . Urethral stricture   . Urinary retention with incomplete bladder emptying   . History of prostate cancer DX  2002    S/P EXTERNAL RADIATION/ RADIOACTIVE  SEED IMPLANTS  2003  . OSA (obstructive sleep apnea) intolerant cpap  . Self-catheterizes urinary bladder     QID   AND   PRN  . HTN (hypertension)   . Saliva decreased   . S/P radiation therapy ended 01-26-2012       for tonsillar cancer  . Restless legs syndrome (RLS) 10/31/2011  . Nocturia 08/08/2011  . Vitamin B 12 deficiency 01/28/2011  . Vitamin D deficiency   . Hyperlipemia   . DDD (degenerative disc disease)   . History of prostate cancer     S/P  RADIACTIVE PROSTATE SEED IMPLANTS 2003  . Bladder neck contracture   . History of squamous cell carcinoma excision     2012--  RIGHT LOWER EXTREM  . Hypothyroidism   . BPH (benign prostatic hyperplasia)   . GERD (gastroesophageal reflux disease)   . Full dentures   . Tonsillar cancer ([place]) 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 ---- dx oct 2013  ----s/p radiation/  ended 01-26-2012-- no surgical intervention---  residuals ( dry mouth, decreased saliva)    oncologist at [facility]--  dr brizel--  HX OF -- NO RECURRENCE  . Macular degeneration     Past Surgical History  Procedure Laterality Date  . Radioactive seed implants, prostate  JAN  2003  . Cysto/ balloon dilation of  urethral stricture  12-25-2010  . Cystoscopy with urethral dilatation  05/31/2011    Procedure: CYSTOSCOPY WITH URETHRAL DILATATION;  Surgeon: Reece Packer, MD;  Location: [facility];  Service: Urology;  Laterality: N/A;  BALLOON DILATION  . Cataract extraction w/ intraocular lens  implant, bilateral    . Cystoscopy/retrograde/ureteroscopy N/A 05/22/2012    Procedure: CYSTOSCOPY BALLOON DILATION RETROGRADE URETEROGRAM ;  Surgeon: Reece Packer, MD;  Location: [facility];  Service: Urology;  Laterality: N/A;  . Skin lesion excision  07/2012    MOST  right shoulder  . Inguinal hernia repair Right 2000  . Cytso/  dilatation urethral stricture/  bx prostatic urethra/  removal foreign bodies  06-25-2010   [facility]  .  Balloon dilation N/A 11/10/2014    Procedure: CYSTO BALLOON DILATION AND RETROGRADE URETHROGRAM ;  Surgeon: Bjorn Loser, MD;  Location: [facility];  Service: Urology;  Laterality: N/A;   Social History   Social History  . Marital Status: Married    Spouse Name: N/A  . Number of Children: 4  . Years of Education: college   Occupational History  . retired    Social History Main Topics  . Smoking status: Never Smoker   . Smokeless tobacco: Never Used  . Alcohol Use: 4.2 oz/week    7 Glasses of wine per week     Comment: 8oz  . Drug Use: No  . Sexual Activity: Not on file   Other Topics Concern  . 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     Allergies  Allergen Reactions  . Cephalosporins Rash  . Suprax [Cefixime] Rash      Medication List       This list is accurate as of: 05/20/15 10:57 AM.  Always use your most recent med list.               ASHWAGANDHA PO  Take by mouth daily.     buPROPion 100 MG tablet  Commonly known as:  WELLBUTRIN  TAKE ONE TABLET BY MOUTH TWICE DAILY     CALCIUM PO  Take 800 mg by mouth daily. TWO  400 MG TAB     COLLAGEN PO  Take 600 mg by mouth 2 (two) times daily. Take 600 mg twice daily.     COLOSTRUM PO  Take by mouth. One teaspoon daily 1 oz of water     CoQ10 100 MG Caps  Take 100 mg by mouth daily. Take one daily     cyanocobalamin 100 MCG tablet  Take 100 mcg by mouth daily.     DIGESTIVE ENZYMES PO  Take 500 mg by mouth every morning.     fish oil-omega-3 fatty acids 1000 MG capsule  Take 2 g by mouth daily.     GLUCOSAMINE-CHONDROITIN-MSM PO  Take 500 mg by mouth 2 (two) times daily. Take 500 mg twice daily.     HYALURONIC ACID PO  Take by mouth. Take 100mg  twice daily for joinits     lisinopril 10 MG tablet  Commonly known as:  PRINIVIL,ZESTRIL  TAKE ONE TABLET BY MOUTH  ONCE DAILY.     multivitamins ther. w/minerals Tabs tablet  Take 2 tablets by mouth 2 (two) times daily.     NATURAL VITAMIN E 400 UNIT capsule  Generic drug:  vitamin E  Take 400 Units by mouth daily.     RESVERATROL PO  Take 100 mg by mouth 2 (two) times daily. Take 2 100 mg tablets twice daily.     Thyroid 113.75 MG Tabs  Commonly known as:  NATURE-THROID  Take 1 tablet by mouth daily before breakfast. At least 30 mins before breakfast or any other medications     TURMERIC CURCUMIN PO  Take 650 mg by mouth every morning.     vitamin C 1000 MG tablet  Take 1,000 mg by mouth daily.     Vitamin D3 5000 units Caps  Take by mouth.     VITAMIN K2 PO  Take by mouth daily.       Review of Systems:  Review of Systems  Constitutional: Negative for fever, chills, weight loss and malaise/fatigue.  HENT: Negative for hearing loss.        Concerned about cerumen in ears  Eyes: Negative for blurred vision.  Respiratory: Negative for cough and shortness of breath.   Cardiovascular: Negative for chest pain and palpitations.  Gastrointestinal: Positive for abdominal pain. Negative for diarrhea, constipation, blood in stool and melena.  Genitourinary: Negative for urgency.       No incontinence  Musculoskeletal: Negative for falls.  Skin: Negative for rash.  Neurological: Negative for dizziness, loss of consciousness and weakness.  Endo/Heme/Allergies: Does not bruise/bleed easily.  Psychiatric/Behavioral: Negative for depression and memory loss. The patient has insomnia.     Physical Exam: Filed Vitals:   05/20/15 1025  BP: 130/80  Pulse: 60  Temp: 97.8 F (36.6 C)  TempSrc: Oral  Height: 6\' 1"  (1.854 m)  Weight: 161 lb (73.029 kg)  SpO2: 98%   Body mass index is 21.25 kg/(m^2). Physical Exam  Constitutional: He is oriented to person, place, and time. He appears well-developed and well-nourished. No distress.  HENT:  Head: Normocephalic and atraumatic.  Right Ear:  External ear normal.  Left Ear: External ear normal.  Nose: Nose normal.  Mouth/Throat: Oropharynx is clear and moist. No oropharyngeal exudate.  Eyes: Conjunctivae and EOM are normal. Pupils are equal, round, and reactive to light.  Neck: Neck supple. No JVD present. No tracheal deviation present. No thyromegaly present.  Cardiovascular: Normal rate, regular rhythm, normal heart sounds and intact distal pulses.   Pulmonary/Chest: Effort normal and breath sounds normal. No respiratory distress.  Abdominal: Soft. Bowel sounds are normal. He exhibits no distension and no mass. There is no tenderness. There is no rebound and no guarding. No hernia.  Musculoskeletal: Normal range of motion.  Lymphadenopathy:    He has no cervical adenopathy.  Neurological: He is alert and oriented to person, place, and time. He has normal reflexes. No cranial nerve deficit. He exhibits normal muscle tone.  Skin: Skin is warm and dry.  Psychiatric: He has a normal mood and affect.    Labs reviewed: Basic Metabolic Panel:  Recent Labs  07/01/14 2251 09/18/14 11/10/14 1115 12/18/14 12/30/14 05/12/15  NA 135 145 140 139  --   --   K 4.1 4.2 4.2 4.6  --   --   CL 105  --   --   --   --   --   CO2 22  --   --   --   --   --   GLUCOSE 120*  --  95  --   --   --   BUN 26* 27*  --  27*  --   --   CREATININE 1.59* 1.6*  --  1.6*  --   --   CALCIUM 8.4*  --   --   --   --   --   TSH  --  0.15*  --  0.06* 0.04* 0.57   Liver Function Tests:  Recent Labs  07/01/14 2251 09/18/14 12/18/14  AST 17 15 15   ALT 14* 12 13  ALKPHOS 69 61 71  BILITOT 1.0  --   --   PROT 7.3  --   --   ALBUMIN 3.5  --   --     Recent Labs  07/01/14 2251  LIPASE 27   No results for input(s): AMMONIA in the last 8760 hours. CBC:  Recent Labs  07/01/14 2251 09/18/14 11/10/14 1115 12/18/14  WBC 9.8 7.0  --  8.5  NEUTROABS 8.0*  --   --   --   HGB 12.7* 13.4* 13.9 13.7  HCT 37.0* 41 41.0 41  MCV 90.7  --   --   --     PLT 196 213  --  262   Lipid Panel:  Recent Labs  12/18/14  CHOL 221*  HDL 59  LDLCALC 148  TRIG 97   No results found for: HGBA1C Reviewed neuro note  EKG:  Sinus brady at 53bpm  Assessment/Plan 1. Medicare annual wellness visit, subsequent -up to date on his wellness items except zostavax which he has not gotten  2. Essential hypertension -bp well controlled - EKG 12-Lead unchanged from previous in fall of last year  3. Periodic  limb movement disorder (PLMD) -pending further eval of this and his sleep apnea with [facility] -had not been accepting of his sleep apnea as the cause of his insomnia and fatigue and not adherent with any type of mask/cpap  4. Hypothyroidism due to acquired atrophy of thyroid -being followed by endocrine now due to his desire to take a different type of thyroid supplement  5. History of right inguinal hernia repair -with some recent discomfort and slight prominence he noted in that area, but had improved by his last visit with me -does continues with some b/l lower abdominal pain when prone -keep upcoming gen surg eval  6. Leg pain, bilateral -reports aching when he starts off walking and it gradually gets better as he's more mobile -he's not sure where it bothers him, but had been reading about PAD and was concerned he had it (seems unlikely for him and I suspect more generalized aches from aging joints and muscles) -recommended more stretching prior to his walking  -does do yoga a few times per week already  7.  Lower abdominal pain -at hs when in prone position -?flatus -unclear etiology, seems this resolved based on history so will monitor  Labs/tests ordered:  EKG Next appt:  3 mos med mgt 08/19/2015 to review all of his specialty visits  Rose-Marie Hickling L. Kenitra Leventhal, D.O. [facility] [address], [place] [address] Cell Phone (Mon-Fri 8am-5pm):  [phone] On Call:  [phone]  & follow prompts after 5pm & weekends Office Phone:  [phone] Office Fax:  [phone]

## 2015-05-20 NOTE — Progress Notes (Signed)
Patient ID: Philip [name], male   DOB: [birth year], 82 y.o.   MRN: [id number] MMSE 30/30 passed clock drawing

## 2015-05-21 DIAGNOSIS — M5136 Other intervertebral disc degeneration, lumbar region: Secondary | ICD-10-CM | POA: Diagnosis not present

## 2015-05-21 DIAGNOSIS — M47816 Spondylosis without myelopathy or radiculopathy, lumbar region: Secondary | ICD-10-CM | POA: Diagnosis not present

## 2015-05-21 DIAGNOSIS — M545 Low back pain: Secondary | ICD-10-CM | POA: Diagnosis not present

## 2015-05-21 DIAGNOSIS — M9903 Segmental and somatic dysfunction of lumbar region: Secondary | ICD-10-CM | POA: Diagnosis not present

## 2015-05-25 DIAGNOSIS — M545 Low back pain: Secondary | ICD-10-CM | POA: Diagnosis not present

## 2015-05-25 DIAGNOSIS — M5136 Other intervertebral disc degeneration, lumbar region: Secondary | ICD-10-CM | POA: Diagnosis not present

## 2015-05-25 DIAGNOSIS — M47816 Spondylosis without myelopathy or radiculopathy, lumbar region: Secondary | ICD-10-CM | POA: Diagnosis not present

## 2015-05-25 DIAGNOSIS — M9903 Segmental and somatic dysfunction of lumbar region: Secondary | ICD-10-CM | POA: Diagnosis not present

## 2015-05-26 DIAGNOSIS — M47816 Spondylosis without myelopathy or radiculopathy, lumbar region: Secondary | ICD-10-CM | POA: Diagnosis not present

## 2015-05-26 DIAGNOSIS — M545 Low back pain: Secondary | ICD-10-CM | POA: Diagnosis not present

## 2015-05-26 DIAGNOSIS — M5136 Other intervertebral disc degeneration, lumbar region: Secondary | ICD-10-CM | POA: Diagnosis not present

## 2015-05-26 DIAGNOSIS — M9903 Segmental and somatic dysfunction of lumbar region: Secondary | ICD-10-CM | POA: Diagnosis not present

## 2015-05-28 DIAGNOSIS — M9903 Segmental and somatic dysfunction of lumbar region: Secondary | ICD-10-CM | POA: Diagnosis not present

## 2015-05-28 DIAGNOSIS — M5136 Other intervertebral disc degeneration, lumbar region: Secondary | ICD-10-CM | POA: Diagnosis not present

## 2015-05-28 DIAGNOSIS — M47816 Spondylosis without myelopathy or radiculopathy, lumbar region: Secondary | ICD-10-CM | POA: Diagnosis not present

## 2015-05-28 DIAGNOSIS — M545 Low back pain: Secondary | ICD-10-CM | POA: Diagnosis not present

## 2015-05-29 ENCOUNTER — Encounter (HOSPITAL_COMMUNITY): Payer: Self-pay | Admitting: Nurse Practitioner

## 2015-05-29 ENCOUNTER — Telehealth: Payer: Self-pay | Admitting: *Deleted

## 2015-05-29 ENCOUNTER — Ambulatory Visit (HOSPITAL_COMMUNITY)
Admission: EM | Admit: 2015-05-29 | Discharge: 2015-05-29 | Disposition: A | Discharge status: Home / Self Care | Payer: Medicare Other | Attending: Emergency Medicine | Admitting: Emergency Medicine

## 2015-05-29 DIAGNOSIS — K5909 Other constipation: Secondary | ICD-10-CM | POA: Diagnosis not present

## 2015-05-29 DIAGNOSIS — R1030 Lower abdominal pain, unspecified: Secondary | ICD-10-CM | POA: Diagnosis not present

## 2015-05-29 LAB — POCT URINALYSIS DIP (DEVICE)
Bilirubin Urine: NEGATIVE
Glucose, UA: NEGATIVE mg/dL
HGB URINE DIPSTICK: NEGATIVE
Ketones, ur: NEGATIVE mg/dL
Leukocytes, UA: NEGATIVE
Nitrite: NEGATIVE
PH: 6.5 (ref 5.0–8.0)
PROTEIN: 30 mg/dL — AB
SPECIFIC GRAVITY, URINE: 1.02 (ref 1.005–1.030)
UROBILINOGEN UA: 0.2 mg/dL (ref 0.0–1.0)

## 2015-05-29 NOTE — ED Provider Notes (Signed)
CSN: [id number]     Arrival date & time 05/29/15  1257 History   First MD Initiated Contact with Patient 05/29/15 1320     Chief Complaint  Patient presents with  . Abdominal Pain   (Consider location/radiation/quality/duration/timing/severity/associated sxs/prior Treatment) HPI Comments: 82 yo with history of prostate cancer and permanent cath presents to have "his urine checked". He lives at [facility] and it was suggested he come for a urinalysis. He reports pain in the lower abdominal (dull ache) only at night when lying still. It has been difficult for him to rest. He notes some constipation. No N,V. No urinary symptoms are noted. No fever or chills. Prior history of right hernia repair years ago.   Patient is a 83 y.o. male presenting with abdominal pain. The history is provided by the patient.  Abdominal Pain Associated symptoms: constipation   Associated symptoms: no chills, no cough, no diarrhea, no dysuria, no fatigue, no fever, no nausea, no shortness of breath and no vomiting     Past Medical History  Diagnosis Date  . Urethral stricture   . Urinary retention with incomplete bladder emptying   . History of prostate cancer DX  2002    S/P EXTERNAL RADIATION/ RADIOACTIVE SEED IMPLANTS  2003  . OSA (obstructive sleep apnea) intolerant cpap  . Self-catheterizes urinary bladder     QID   AND   PRN  . HTN (hypertension)   . Saliva decreased   . S/P radiation therapy ended 01-26-2012       for tonsillar cancer  . Restless legs syndrome (RLS) 10/31/2011  . Nocturia 08/08/2011  . Vitamin B 12 deficiency 01/28/2011  . Vitamin D deficiency   . Hyperlipemia   . DDD (degenerative disc disease)   . History of prostate cancer     S/P  RADIACTIVE PROSTATE SEED IMPLANTS 2003  . Bladder neck contracture   . History of squamous cell carcinoma excision     2012--  RIGHT LOWER EXTREM  . Hypothyroidism   . BPH (benign prostatic hyperplasia)   . GERD (gastroesophageal reflux disease)    . Full dentures   . Tonsillar cancer ([place]) 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 ---- dx oct 2013  ----s/p radiation/  ended 01-26-2012-- no surgical intervention---  residuals ( dry mouth, decreased saliva)    oncologist at [facility]--  dr brizel--  HX OF -- NO RECURRENCE  . Macular degeneration    Past Surgical History  Procedure Laterality Date  . Radioactive seed implants, prostate  JAN  2003  . Cysto/ balloon dilation of  urethral stricture  12-25-2010  . Cystoscopy with urethral dilatation  05/31/2011    Procedure: CYSTOSCOPY WITH URETHRAL DILATATION;  Surgeon: Reece Packer, MD;  Location: [place];  Service: Urology;  Laterality: N/A;  BALLOON DILATION  . Cataract extraction w/ intraocular lens  implant, bilateral    . Cystoscopy/retrograde/ureteroscopy N/A 05/22/2012    Procedure: CYSTOSCOPY BALLOON DILATION RETROGRADE URETEROGRAM ;  Surgeon: Reece Packer, MD;  Location: [place];  Service: Urology;  Laterality: N/A;  . Skin lesion excision  07/2012    MOST  right shoulder  . Inguinal hernia repair Right 2000  . Cytso/  dilatation urethral stricture/  bx prostatic urethra/  removal foreign bodies  06-25-2010   [facility]  . Balloon dilation N/A 11/10/2014    Procedure: CYSTO BALLOON DILATION AND RETROGRADE URETHROGRAM ;  Surgeon: Philip Loser, MD;  Location: [facility]  Service: Urology;  Laterality: N/A;   Family History  Problem Relation Age of Onset  . Stroke Father    Social History  Substance Use Topics  . Smoking status: Never Smoker   . Smokeless tobacco: Never Used  . Alcohol Use: 4.2 oz/week    7 Glasses of wine per week     Comment: 8oz    Review of Systems  Constitutional: Negative for fever, chills and fatigue.  Respiratory: Negative for cough and shortness of breath.   Gastrointestinal: Positive for abdominal pain and constipation. Negative for nausea, vomiting, diarrhea and blood in  stool.  Genitourinary: Negative for dysuria, urgency, penile swelling, difficulty urinating, penile pain and testicular pain.  Skin: Negative.     Allergies  Cephalosporins and Suprax  Home Medications   Prior to Admission medications   Medication Sig Start Date End Date Taking? Authorizing Provider  Ascorbic Acid (VITAMIN C) 1000 MG tablet Take 1,000 mg by mouth daily.    Historical Provider, MD  ASHWAGANDHA PO Take by mouth daily.    Historical Provider, MD  buPROPion (WELLBUTRIN) 100 MG tablet TAKE ONE TABLET BY MOUTH TWICE DAILY 04/09/15   Tiffany L Reed, DO  CALCIUM PO Take 800 mg by mouth daily. TWO  400 MG TAB    Historical Provider, MD  Cholecalciferol (VITAMIN D3) 5000 UNITS CAPS Take by mouth.    Historical Provider, MD  Coenzyme Q10 (COQ10) 100 MG CAPS Take 100 mg by mouth daily. Take one daily    Historical Provider, MD  COLLAGEN PO Take 600 mg by mouth 2 (two) times daily. Take 600 mg twice daily.    Historical Provider, MD  COLOSTRUM PO Take by mouth. One teaspoon daily 1 oz of water    Historical Provider, MD  cyanocobalamin 100 MCG tablet Take 100 mcg by mouth daily.    Historical Provider, MD  DIGESTIVE ENZYMES PO Take 500 mg by mouth every morning.     Historical Provider, MD  fish oil-omega-3 fatty acids 1000 MG capsule Take 2 g by mouth daily.    Historical Provider, MD  GLUCOSAMINE-CHONDROITIN-MSM PO Take 500 mg by mouth 2 (two) times daily. Take 500 mg twice daily.    Historical Provider, MD  Hyaluronic Acid-Vitamin C (HYALURONIC ACID PO) Take by mouth. Take 100mg  twice daily for joinits    Historical Provider, MD  lisinopril (PRINIVIL,ZESTRIL) 10 MG tablet TAKE ONE TABLET BY MOUTH ONCE DAILY. 05/11/15   Tiffany L Reed, DO  Menaquinone-7 (VITAMIN K2 PO) Take by mouth daily.    Historical Provider, MD  Multiple Vitamins-Minerals (MULTIVITAMINS THER. W/MINERALS) TABS Take 2 tablets by mouth 2 (two) times daily.     Historical Provider, MD  RESVERATROL PO Take 100 mg by  mouth 2 (two) times daily. Take 2 100 mg tablets twice daily.    Historical Provider, MD  Thyroid (NATURE-THROID) 113.75 MG TABS Take 1 tablet by mouth daily before breakfast. At least 30 mins before breakfast or any other medications 01/06/15   Tiffany L Reed, DO  TURMERIC CURCUMIN PO Take 650 mg by mouth every morning.    Historical Provider, MD  vitamin E (NATURAL VITAMIN E) 400 UNIT capsule Take 400 Units by mouth daily.    Historical Provider, MD   Meds Ordered and Administered this Visit  Medications - No data to display  BP 149/83 mmHg  Pulse 54  Temp(Src) 97.5 F (36.4 C) (Oral)  Resp 18  SpO2 99% No data found.   Physical Exam  Constitutional: He is oriented to person, place, and time. He appears well-developed and well-nourished. No distress.  HENT:  Head: Normocephalic and atraumatic.  Pulmonary/Chest: Effort normal and breath sounds normal.  Abdominal: Soft. Bowel sounds are normal. He exhibits mass. He exhibits no distension. There is no tenderness. There is no rebound and no guarding.  Palpated bowel in the colon, most noted along the right quadrant, no supra-pubic pain  Neurological: He is alert and oriented to person, place, and time.  Skin: Skin is warm. He is not diaphoretic.  Psychiatric: His behavior is normal.  Nursing note and vitals reviewed.   ED Course  Procedures (including critical care time)  Labs Review Labs Reviewed  POCT URINALYSIS DIP (DEVICE) - Abnormal; Notable for the following:    Protein, ur 30 (*)    All other components within normal limits    Imaging Review No results found.   Visual Acuity Review  Right Eye Distance:   Left Eye Distance:   Bilateral Distance:    Right Eye Near:   Left Eye Near:    Bilateral Near:         MDM   1. Lower abdominal pain   2. Other constipation    Patient only wanted a U/A which was normal. NON-acute ABD and non-toxic.  Some stool by exam which we are going to treat for constipation.  Explained that his is not a concrete diagnosis and if he worsens should go to the ED for further work up.  Mild bradycardia at baseline in review of previous notes. (asymtpomatic)    Philip Pippin, PA-C 05/29/15 1429

## 2015-05-29 NOTE — Discharge Instructions (Signed)
Abdominal Pain, Adult   Your abdominal pain does not appear to be from a urinary tract infection. You did have stool on exam which suggest possible constipation though this is not a firm diagnosis.  As we discussed try Miralax 2x a day until good BM then can use as needed. Magnesium Citrate OTC (whole bottle) will also help with this. If you worsen then suggest going to the ED where they can further evaluate your pain.      Many things can cause abdominal pain. Usually, abdominal pain is not caused by a disease and will improve without treatment. It can often be observed and treated at home. Your health care provider will do a physical exam and possibly order blood tests and X-rays to help determine the seriousness of your pain. However, in many cases, more time must pass before a clear cause of the pain can be found. Before that point, your health care provider may not know if you need more testing or further treatment. HOME CARE INSTRUCTIONS Monitor your abdominal pain for any changes. The following actions may help to alleviate any discomfort you are experiencing:  Only take over-the-counter or prescription medicines as directed by your health care provider.  Do not take laxatives unless directed to do so by your health care provider.  Try a clear liquid diet (broth, tea, or water) as directed by your health care provider. Slowly move to a bland diet as tolerated. SEEK MEDICAL CARE IF:  You have unexplained abdominal pain.  You have abdominal pain associated with nausea or diarrhea.  You have pain when you urinate or have a bowel movement.  You experience abdominal pain that wakes you in the night.  You have abdominal pain that is worsened or improved by eating food.  You have abdominal pain that is worsened with eating fatty foods.  You have a fever. SEEK IMMEDIATE MEDICAL CARE IF:  Your pain does not go away within 2 hours.  You keep throwing up (vomiting).  Your pain is  felt only in portions of the abdomen, such as the right side or the left lower portion of the abdomen.  You pass bloody or black tarry stools. MAKE SURE YOU:  Understand these instructions.  Will watch your condition.  Will get help right away if you are not doing well or get worse.   This information is not intended to replace advice given to you by your health care provider. Make sure you discuss any questions you have with your health care provider.   Document Released: 10/13/2004 Document Revised: 09/24/2014 Document Reviewed: 09/12/2012 Elsevier Interactive Patient Education 2016 [employer].  Constipation, Adult Constipation is when a person has fewer than three bowel movements a week, has difficulty having a bowel movement, or has stools that are dry, hard, or larger than normal. As people grow older, constipation is more common. A low-fiber diet, not taking in enough fluids, and taking certain medicines may make constipation worse.  CAUSES   Certain medicines, such as antidepressants, pain medicine, iron supplements, antacids, and water pills.   Certain diseases, such as diabetes, irritable bowel syndrome (IBS), thyroid disease, or depression.   Not drinking enough water.   Not eating enough fiber-rich foods.   Stress or travel.   Lack of physical activity or exercise.   Ignoring the urge to have a bowel movement.   Using laxatives too much.  SIGNS AND SYMPTOMS   Having fewer than three bowel movements a week.   Straining to  have a bowel movement.   Having stools that are hard, dry, or larger than normal.   Feeling full or bloated.   Pain in the lower abdomen.   Not feeling relief after having a bowel movement.  DIAGNOSIS  Your health care provider will take a medical history and perform a physical exam. Further testing may be done for severe constipation. Some tests may include:  A barium enema X-ray to examine your rectum, colon, and,  sometimes, your small intestine.   A sigmoidoscopy to examine your lower colon.   A colonoscopy to examine your entire colon. TREATMENT  Treatment will depend on the severity of your constipation and what is causing it. Some dietary treatments include drinking more fluids and eating more fiber-rich foods. Lifestyle treatments may include regular exercise. If these diet and lifestyle recommendations do not help, your health care provider may recommend taking over-the-counter laxative medicines to help you have bowel movements. Prescription medicines may be prescribed if over-the-counter medicines do not work.  HOME CARE INSTRUCTIONS   Eat foods that have a lot of fiber, such as fruits, vegetables, whole grains, and beans.  Limit foods high in fat and processed sugars, such as french fries, hamburgers, cookies, candies, and soda.   A fiber supplement may be added to your diet if you cannot get enough fiber from foods.   Drink enough fluids to keep your urine clear or pale yellow.   Exercise regularly or as directed by your health care provider.   Go to the restroom when you have the urge to go. Do not hold it.   Only take over-the-counter or prescription medicines as directed by your health care provider. Do not take other medicines for constipation without talking to your health care provider first.  [place] IF:   You have bright red blood in your stool.   Your constipation lasts for more than 4 days or gets worse.   You have abdominal or rectal pain.   You have thin, pencil-like stools.   You have unexplained weight loss. MAKE SURE YOU:   Understand these instructions.  Will watch your condition.  Will get help right away if you are not doing well or get worse.   This information is not intended to replace advice given to you by your health care provider. Make sure you discuss any questions you have with your health care provider.   Document  Released: 10/02/2003 Document Revised: 01/24/2014 Document Reviewed: 10/15/2012 Elsevier Interactive Patient Education [employer].

## 2015-05-29 NOTE — ED Notes (Signed)
He c/o 1 week history of lower abd pain. Pain is worse at night. He was unable to sleep due to the pain last night. He denies fevers, n/v, urinary changes. He self caths due to hx prostate cancer but has noticed no differences in his urine. He has had less frequent bowel movements. Normally he has a bowel movement daily but recently has been every other day. He is alert and breathing easily

## 2015-05-29 NOTE — Telephone Encounter (Signed)
[name] called to inform Dr. Mariea Clonts that Philip [name] was having pain in his abdomen and could he come into the office for a visit today. She was informed that we didn't have any open appointments today and she stated that she would send him to the emergency room to be evaluated.

## 2015-06-01 ENCOUNTER — Telehealth: Payer: Self-pay | Admitting: *Deleted

## 2015-06-01 DIAGNOSIS — M47816 Spondylosis without myelopathy or radiculopathy, lumbar region: Secondary | ICD-10-CM | POA: Diagnosis not present

## 2015-06-01 DIAGNOSIS — M9903 Segmental and somatic dysfunction of lumbar region: Secondary | ICD-10-CM | POA: Diagnosis not present

## 2015-06-01 DIAGNOSIS — R109 Unspecified abdominal pain: Secondary | ICD-10-CM

## 2015-06-01 DIAGNOSIS — M545 Low back pain: Secondary | ICD-10-CM | POA: Diagnosis not present

## 2015-06-01 DIAGNOSIS — M5136 Other intervertebral disc degeneration, lumbar region: Secondary | ICD-10-CM | POA: Diagnosis not present

## 2015-06-01 NOTE — Telephone Encounter (Signed)
Philip [name] calling asking for referral for patient, pt was seen at [facility] over the weekend for lower abdominal pain and constipation. Per [name] pt would like referral to GI ( the same dr as Dr. Tennis Must seen) I advised pt would need to be seen by Dr. Mariea Clonts first, which it looks like you don't have any appts this week. Please advise

## 2015-06-01 NOTE — Telephone Encounter (Signed)
Spoke with patient and wife at the same time, per pt he has decreased bowel sounds and was given magnesium citrate and still was having lower abdominal. Per pt he would like referral to Dr. Fuller Plan.  Referral done

## 2015-06-01 NOTE — Telephone Encounter (Signed)
Noted, was seen in urgent care.

## 2015-06-01 NOTE — Telephone Encounter (Signed)
Referral to Dr. Fuller Plan if he is already taking medications to treat constipation.

## 2015-06-02 ENCOUNTER — Telehealth: Payer: Self-pay | Admitting: Gastroenterology

## 2015-06-02 DIAGNOSIS — M47816 Spondylosis without myelopathy or radiculopathy, lumbar region: Secondary | ICD-10-CM | POA: Diagnosis not present

## 2015-06-02 DIAGNOSIS — M9903 Segmental and somatic dysfunction of lumbar region: Secondary | ICD-10-CM | POA: Diagnosis not present

## 2015-06-02 DIAGNOSIS — M5136 Other intervertebral disc degeneration, lumbar region: Secondary | ICD-10-CM | POA: Diagnosis not present

## 2015-06-02 DIAGNOSIS — M545 Low back pain: Secondary | ICD-10-CM | POA: Diagnosis not present

## 2015-06-02 NOTE — Telephone Encounter (Signed)
Next available?

## 2015-06-02 NOTE — Telephone Encounter (Signed)
5/16 - referral in epic.  82 year old patient with Abdominal pain and decreased bowel sounds.  Patient is requesting Dr. Fuller Plan.  Due to age, please advise on urgency.

## 2015-06-03 ENCOUNTER — Telehealth: Payer: Self-pay

## 2015-06-03 ENCOUNTER — Non-Acute Institutional Stay: Payer: Medicare Other | Admitting: Internal Medicine

## 2015-06-03 ENCOUNTER — Encounter: Payer: Self-pay | Admitting: Internal Medicine

## 2015-06-03 VITALS — BP 122/80 | HR 73 | Temp 98.4°F | Wt 159.0 lb

## 2015-06-03 DIAGNOSIS — Z8719 Personal history of other diseases of the digestive system: Secondary | ICD-10-CM | POA: Diagnosis not present

## 2015-06-03 DIAGNOSIS — R194 Change in bowel habit: Secondary | ICD-10-CM

## 2015-06-03 DIAGNOSIS — Z9889 Other specified postprocedural states: Secondary | ICD-10-CM | POA: Diagnosis not present

## 2015-06-03 DIAGNOSIS — R1030 Lower abdominal pain, unspecified: Secondary | ICD-10-CM | POA: Diagnosis not present

## 2015-06-03 DIAGNOSIS — R195 Other fecal abnormalities: Secondary | ICD-10-CM

## 2015-06-03 NOTE — Telephone Encounter (Signed)
Patient's wife called back to add additional information, patient unable to get in with Dr.Stark until July

## 2015-06-03 NOTE — Telephone Encounter (Signed)
Patient called c/o abdominal pain at night that keeps him awake at night. Patient was seen at Urgent Care last week for this pain (see encounter dated 05-29-15) . Patient tried to get an appointment last week and there was no available appointments. Patient would like to see Dr.Reed today at [place] (no available appointments). Patient's wife has an appointment for a PT/INR check at 4:00 pm and questions if her husband could be seen around that time. Patient denies fever or injury to the area.  Please advise

## 2015-06-03 NOTE — Telephone Encounter (Signed)
Spoke with wife and advised it's ok to switch appt with her husband, per verbal from Dr. Mariea Clonts.

## 2015-06-03 NOTE — Progress Notes (Signed)
Location: [facility] psychologist of Service:  Clinic (12)  Provider: Blayn Whetsell L. Mariea Clonts, D.O., C.M.D.  Code Status: DNR Goals of Care:  Advanced Directives 06/03/2015  Does patient have an advance directive? Yes  Type of Advance Directive Healthcare Power of Attorney  Copy of advanced directive(s) in chart? Yes  Pre-existing out of facility DNR order (yellow form or pink MOST form) Yellow form placed in chart (order not valid for inpatient use)   Chief Complaint  Patient presents with  . Acute Visit    constipation    HPI: Patient is a 82 y.o. [name] seen today for an acute visit for abdominal pain felt to be related to constipation.  He called the office on 5/12 with abdominal pain and requesting an appt, but none were available.  He was referred to the ED.  He went to urgent care and requested to have his "urine checked" due to his prostate cancer and permanent catheter.  WS nurse suggested he be seen for a UA.  He c/o a dull lower abdominal ache only at night when lying still that is interfering with his rest.  He admitted to some constipation (BM decreased to every other day from daily) and had no urinary complaints, fever or chills and did have his right hiatal hernia repair.  UA wound up being clean.  He also had mild chronic asymptomatic bradycardia. He then called our office 5/15 for a referral to GI, Dr. Fuller Plan.  Lower abdominal ache across hip to hip after lying down for 30 mins.  Worst was one night when he could not sleep and that's when they went to [facility] urgent care.  They felt he had some stool possibly hard there.  Suggested an MRI and laxative--took mag citrate which took 4 hrs to work, then had diarrhea for 1.5 days.  Next am had normal BM after miralax.  Smaller than usual and thin stools like his 5th finger.  Says stools have been small for a while, but not as thin as that.  When he gets up pain goes away, when he lies down, it comes back.  Energy has gone  back down.  Had worked up to an hour of high standard table tennis and now down to 30 mins.  Didn't go today at all to table tennis.   Appetite is a little less than normal.   Weight stable.   Was waking up with the abdominal pain so rescheduled the sleep study.  Past Medical History  Diagnosis Date  . Urethral stricture   . Urinary retention with incomplete bladder emptying   . History of prostate cancer DX  2002    S/P EXTERNAL RADIATION/ RADIOACTIVE SEED IMPLANTS  2003  . OSA (obstructive sleep apnea) intolerant cpap  . Self-catheterizes urinary bladder     QID   AND   PRN  . HTN (hypertension)   . Saliva decreased   . S/P radiation therapy ended 01-26-2012       for tonsillar cancer  . Restless legs syndrome (RLS) 10/31/2011  . Nocturia 08/08/2011  . Vitamin B 12 deficiency 01/28/2011  . Vitamin D deficiency   . Hyperlipemia   . DDD (degenerative disc disease)   . History of prostate cancer     S/P  RADIACTIVE PROSTATE SEED IMPLANTS 2003  . Bladder neck contracture   . History of squamous cell carcinoma excision     2012--  RIGHT LOWER EXTREM  . Hypothyroidism   . BPH (  benign prostatic hyperplasia)   . GERD (gastroesophageal reflux disease)   . Full dentures   . Tonsillar cancer ([place]) 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 ---- dx oct 2013  ----s/p radiation/  ended 01-26-2012-- no surgical intervention---  residuals ( dry mouth, decreased saliva)    oncologist at [place]--  dr brizel--  HX OF -- NO RECURRENCE  . Macular degeneration     Past Surgical History  Procedure Laterality Date  . Radioactive seed implants, prostate  JAN  2003  . Cysto/ balloon dilation of  urethral stricture  12-25-2010  . Cystoscopy with urethral dilatation  05/31/2011    Procedure: CYSTOSCOPY WITH URETHRAL DILATATION;  Surgeon: Reece Packer, MD;  Location: [place];  Service: Urology;  Laterality: N/A;  BALLOON DILATION  . Cataract extraction w/  intraocular lens  implant, bilateral    . Cystoscopy/retrograde/ureteroscopy N/A 05/22/2012    Procedure: CYSTOSCOPY BALLOON DILATION RETROGRADE URETEROGRAM ;  Surgeon: Reece Packer, MD;  Location: [place];  Service: Urology;  Laterality: N/A;  . Skin lesion excision  07/2012    MOST  right shoulder  . Inguinal hernia repair Right 2000  . Cytso/  dilatation urethral stricture/  bx prostatic urethra/  removal foreign bodies  06-25-2010   [facility]  . Balloon dilation N/A 11/10/2014    Procedure: CYSTO BALLOON DILATION AND RETROGRADE URETHROGRAM ;  Surgeon: Bjorn Loser, MD;  Location: [facility];  Service: Urology;  Laterality: N/A;    Allergies  Allergen Reactions  . Cephalosporins Rash  . Suprax [Cefixime] Rash      Medication List       This list is accurate as of: 06/03/15  4:13 PM.  Always use your most recent med list.               ASHWAGANDHA PO  Take by mouth daily.     buPROPion 100 MG tablet  Commonly known as:  WELLBUTRIN  TAKE ONE TABLET BY MOUTH TWICE DAILY     CALCIUM PO  Take 800 mg by mouth daily. TWO  400 MG TAB     COLLAGEN PO  Take 600 mg by mouth 2 (two) times daily. Take 600 mg twice daily.     COLOSTRUM PO  Take by mouth. One teaspoon daily 1 oz of water     CoQ10 100 MG Caps  Take 100 mg by mouth daily. Take one daily     cyanocobalamin 100 MCG tablet  Take 100 mcg by mouth daily.     DIGESTIVE ENZYMES PO  Take 500 mg by mouth every morning.     fish oil-omega-3 fatty acids 1000 MG capsule  Take 2 g by mouth daily.     GLUCOSAMINE-CHONDROITIN-MSM PO  Take 500 mg by mouth 2 (two) times daily. Take 500 mg twice daily.     HYALURONIC ACID PO  Take by mouth. Take 100mg  twice daily for joinits     lisinopril 10 MG tablet  Commonly known as:  PRINIVIL,ZESTRIL  TAKE ONE TABLET BY MOUTH ONCE DAILY.     multivitamins ther. w/minerals Tabs tablet  Take 2 tablets by mouth 2 (two) times daily.      NATURAL VITAMIN E 400 UNIT capsule  Generic drug:  vitamin E  Take 400 Units by mouth daily.     RESVERATROL PO  Take 100 mg by mouth 2 (two) times daily. Take 2 100 mg tablets twice daily.  Thyroid 113.75 MG Tabs  Commonly known as:  NATURE-THROID  Take 1 tablet by mouth daily before breakfast. At least 30 mins before breakfast or any other medications     TURMERIC CURCUMIN PO  Take 650 mg by mouth every morning.     vitamin C 1000 MG tablet  Take 1,000 mg by mouth daily.     Vitamin D3 5000 units Caps  Take by mouth.     VITAMIN K2 PO  Take by mouth daily.        Review of Systems:  Review of Systems  Constitutional: Positive for malaise/fatigue. Negative for fever and chills.  HENT: Negative for congestion and hearing loss.   Eyes: Negative for blurred vision.  Respiratory: Negative for cough and shortness of breath.   Cardiovascular: Negative for chest pain and leg swelling.  Gastrointestinal: Positive for abdominal pain and constipation. Negative for diarrhea, blood in stool and melena.       Some change in stool caliber but reports longstanding  Genitourinary: Positive for frequency. Negative for dysuria.       Catheter due to retention from prostate ca  Musculoskeletal: Negative for falls.  Skin: Negative for rash.  Neurological: Negative for dizziness, loss of consciousness, weakness and headaches.  Psychiatric/Behavioral: Negative for depression and memory loss. The patient has insomnia.     Health Maintenance  Topic Date Due  . ZOSTAVAX  02/25/1994  . TETANUS/TDAP  01/17/2013  . INFLUENZA VACCINE  08/18/2015  . PNA vac Low Risk Adult  Completed    Physical Exam: Filed Vitals:   06/03/15 1608  BP: 122/80  Pulse: 73  Temp: 98.4 F (36.9 C)  TempSrc: Oral  Weight: 159 lb (72.122 kg)  SpO2: 98%   Body mass index is 20.98 kg/(m^2). Physical Exam  Constitutional: He is oriented to person, place, and time. He appears well-developed and  well-nourished. No distress.  Tall thin white [name]  Cardiovascular: Normal rate, regular rhythm, normal heart sounds and intact distal pulses.   Pulmonary/Chest: Effort normal and breath sounds normal. No respiratory distress.  Abdominal: Soft. Bowel sounds are normal. He exhibits mass. He exhibits no distension. There is tenderness. There is no rebound and no guarding. No hernia.  RLQ tenderness and hard area palpable  Musculoskeletal: Normal range of motion.  Neurological: He is alert and oriented to person, place, and time.  Skin: Skin is warm and dry.  Psychiatric: He has a normal mood and affect.    Labs reviewed: Basic Metabolic Panel:  Recent Labs  07/01/14 2251 09/18/14 11/10/14 1115 12/18/14 12/30/14 05/12/15  NA 135 145 140 139  --   --   K 4.1 4.2 4.2 4.6  --   --   CL 105  --   --   --   --   --   CO2 22  --   --   --   --   --   GLUCOSE 120*  --  95  --   --   --   BUN 26* 27*  --  27*  --   --   CREATININE 1.Philip* 1.6*  --  1.6*  --   --   CALCIUM 8.4*  --   --   --   --   --   TSH  --  0.15*  --  0.06* 0.04* 0.57   Liver Function Tests:  Recent Labs  07/01/14 2251 09/18/14 12/18/14  AST 17 15 15   ALT 14* 12 13  ALKPHOS 69 61 71  BILITOT 1.0  --   --   PROT 7.3  --   --   ALBUMIN 3.5  --   --     Recent Labs  07/01/14 2251  LIPASE 27   No results for input(s): AMMONIA in the last 8760 hours. CBC:  Recent Labs  07/01/14 2251 09/18/14 11/10/14 1115 12/18/14  WBC 9.8 7.0  --  8.5  NEUTROABS 8.0*  --   --   --   HGB 12.7* 13.4* 13.9 13.7  HCT 37.0* 41 41.0 41  MCV 90.7  --   --   --   PLT 196 213  --  262   Lipid Panel:  Recent Labs  12/18/14  CHOL 221*  HDL Philip  LDLCALC 148  TRIG 97   No results found for: HGBA1C Assessment/Plan 1. Lower abdominal pain - suspect he may have a very tortuous bowel causing his constipation and abdominal pain, hard area in RLQ--? Stool -pain only coming when lying down is unusual - CT Abdomen Pelvis W  Contrast; Future  2. Change in stool caliber - reports this has been longstanding but he has never reported it; no melena or hematochezia, but recently more difficulty with constipation - CT Abdomen Pelvis W Contrast; Future -hydration encouraged, stool softener and may use miralax after no bm in 2 full days  3. History of right inguinal hernia repair -of note, but no abnormality felt in this region and no recurrence noted on exam  Labs/tests ordered:  Orders Placed This Encounter  Procedures  . CT Abdomen Pelvis W Contrast    **pt to arrive at 1230pm for labs** Wt-155/not diab/no kidney problems/nkda to ct iv/no needs [facility] Epic order [facility] to [facility] oral [facility] at [facility]    Standing Status: Future     Number of Occurrences: 1     Standing Expiration Date: 09/02/2016    Order Specific Question:  If indicated for the ordered procedure, I authorize the administration of contrast media per Radiology protocol    Answer:  Yes    Order Specific Question:  Reason for Exam (SYMPTOM  OR DIAGNOSIS REQUIRED)    Answer:  lower abdominal pain, worse at night in supine position, hard area palpable RLQ, smaller caliber stools    Order Specific Question:  Preferred imaging location?    Answer:  [id number]    Next appt:  08/19/2015 as scheduled unless abnormality on CT requires face to face discussion or intervention immediately   Aundra Pung L. Daryle Amis, D.O. [facility] [address], [place] [address] Cell Phone (Mon-Fri 8am-5pm):  [phone] On Call:  [phone] & follow prompts after 5pm & weekends Office Phone:  [phone] Office Fax:  [phone]

## 2015-06-04 ENCOUNTER — Ambulatory Visit
Admission: RE | Admit: 2015-06-04 | Discharge: 2015-06-04 | Disposition: A | Discharge status: Home / Self Care | Payer: Medicare Other | Source: Ambulatory Visit | Attending: Internal Medicine | Admitting: Internal Medicine

## 2015-06-04 DIAGNOSIS — M9903 Segmental and somatic dysfunction of lumbar region: Secondary | ICD-10-CM | POA: Diagnosis not present

## 2015-06-04 DIAGNOSIS — R1030 Lower abdominal pain, unspecified: Secondary | ICD-10-CM | POA: Diagnosis not present

## 2015-06-04 DIAGNOSIS — R195 Other fecal abnormalities: Secondary | ICD-10-CM

## 2015-06-04 DIAGNOSIS — M47816 Spondylosis without myelopathy or radiculopathy, lumbar region: Secondary | ICD-10-CM | POA: Diagnosis not present

## 2015-06-04 DIAGNOSIS — M5136 Other intervertebral disc degeneration, lumbar region: Secondary | ICD-10-CM | POA: Diagnosis not present

## 2015-06-04 DIAGNOSIS — M545 Low back pain: Secondary | ICD-10-CM | POA: Diagnosis not present

## 2015-06-04 IMAGING — CT CT ABD-PELV W/ CM
1 series · 14 of 34 positions shown, 18 images · IV contrast (OMNI 300/WATER & 75CC ISOVUE 300)
Comparison: None.

CLINICAL DATA: Lower abdominal pain.  Worse when lying flat.

EXAM:
CT ABDOMEN AND PELVIS WITH CONTRAST
TECHNIQUE: Multidetector CT imaging of the abdomen and pelvis was performed
using the standard protocol following bolus administration of
intravenous contrast.
CONTRAST:  75mL [id number] IOPAMIDOL ([id number]-III) INJECTION 61%,
30mL OMNIPAQUE IOHEXOL 300 MG/ML SOLN

[Series 601: coronal body · coronal · 0.87mm/px · 14 of 120 slices shown, 18 images]
[im 4/120  lung]
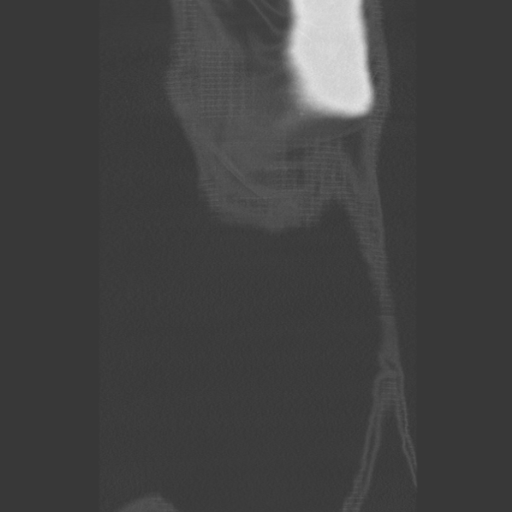
[im 8/120  soft-tissue]
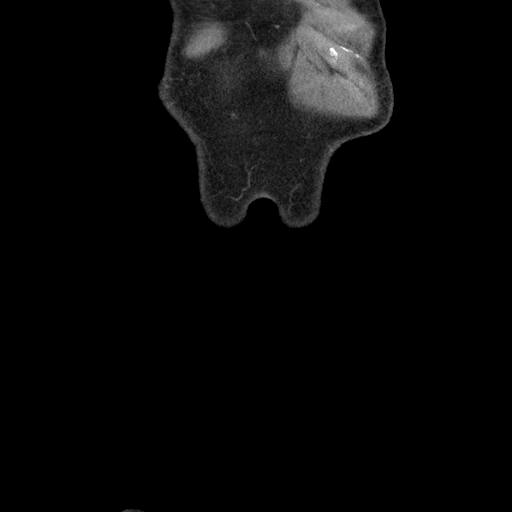
[im 8/120  lung]
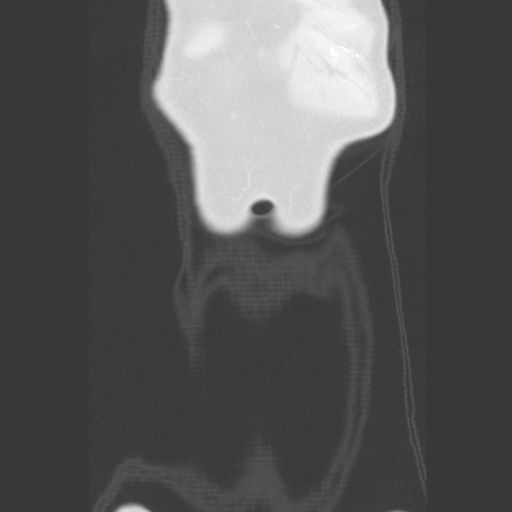
[im 8/120  bone]
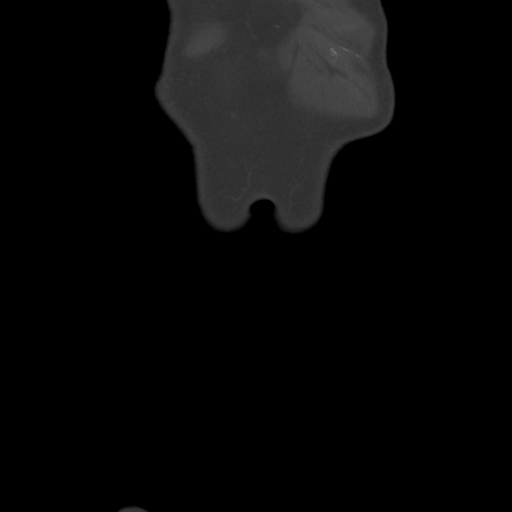
[im 12/120  lung]
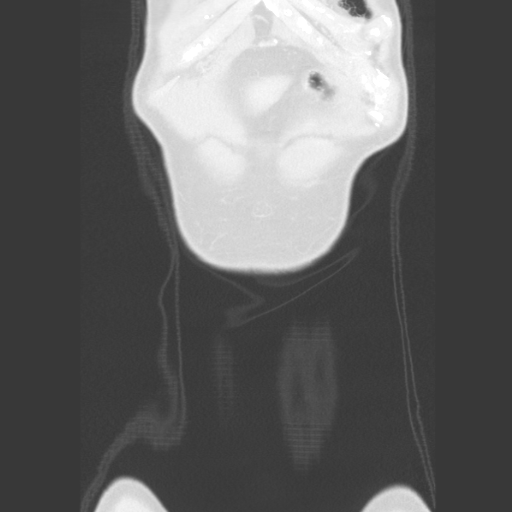
[im 16/120  soft-tissue]
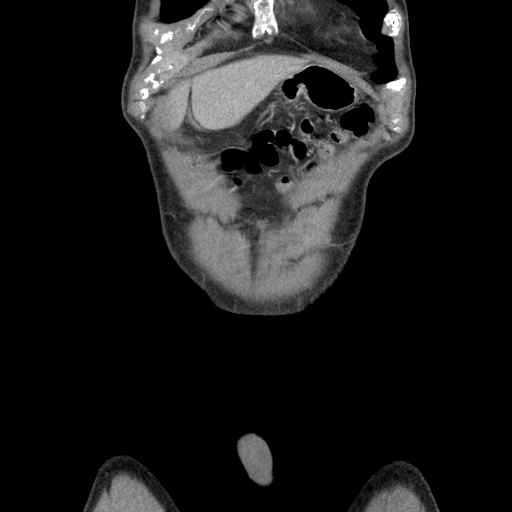
[im 16/120  lung]
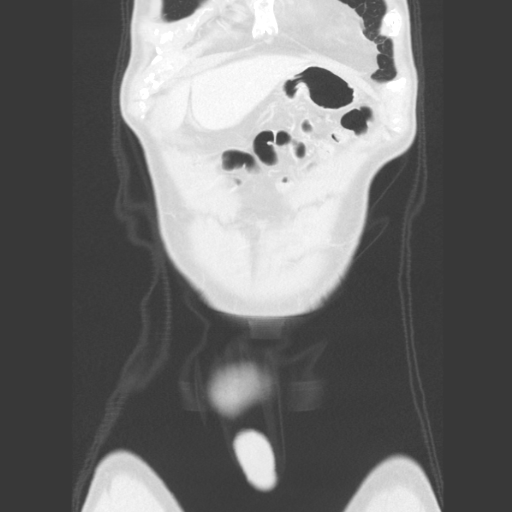
[im 27/120  soft-tissue]
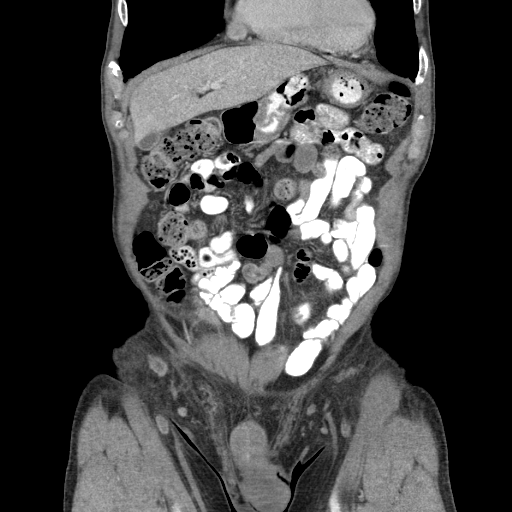
[im 39/120  soft-tissue]
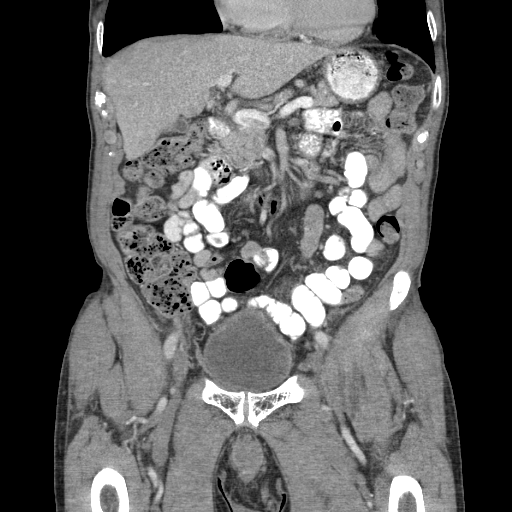
[im 43/120  soft-tissue]
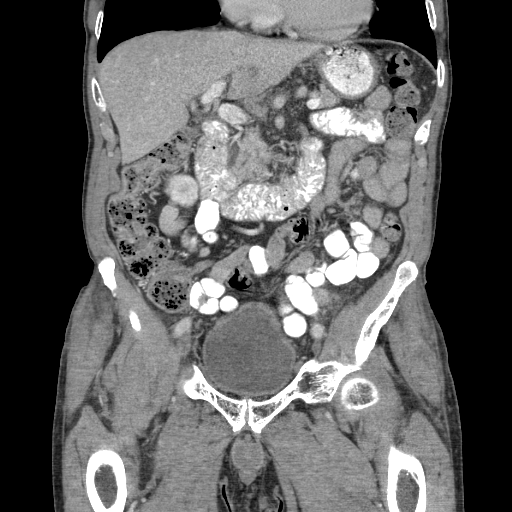
[im 54/120  soft-tissue]
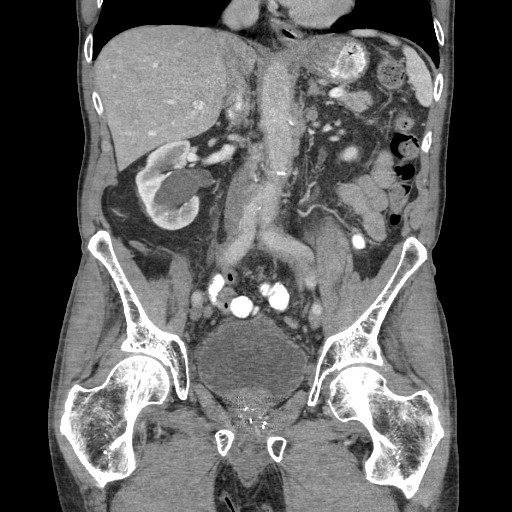
[im 62/120  soft-tissue]
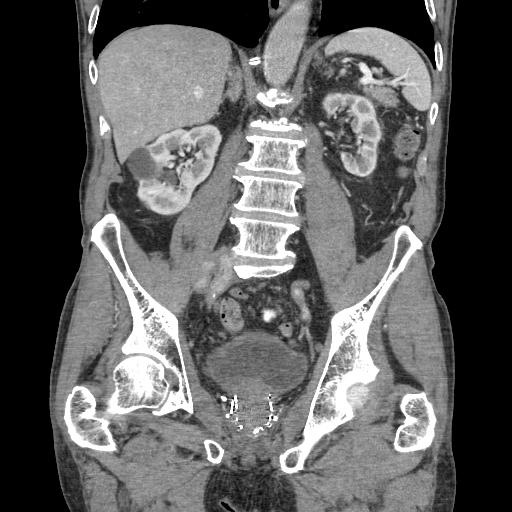
[im 73/120  soft-tissue]
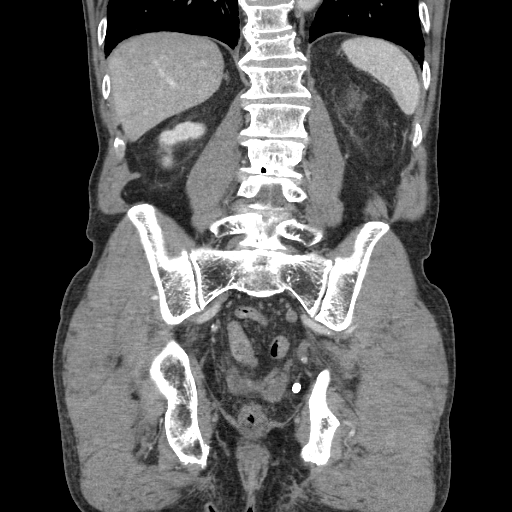
[im 80/120  soft-tissue]
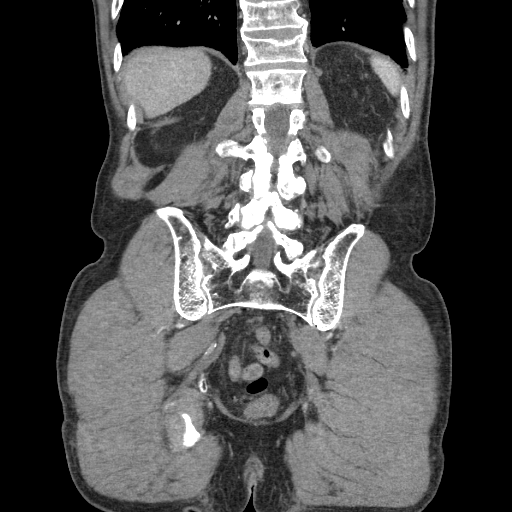
[im 80/120  bone]
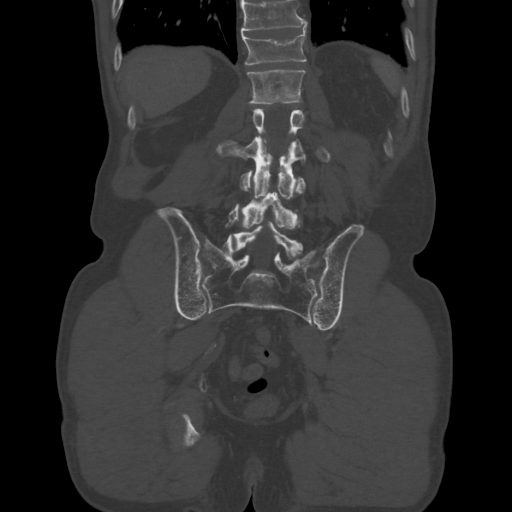
[im 89/120  soft-tissue]
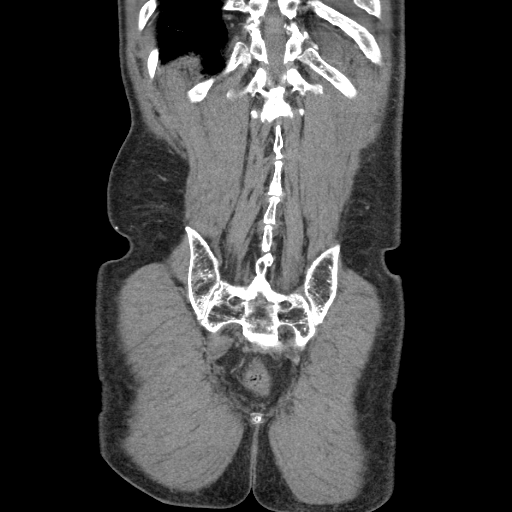
[im 100/120  soft-tissue]
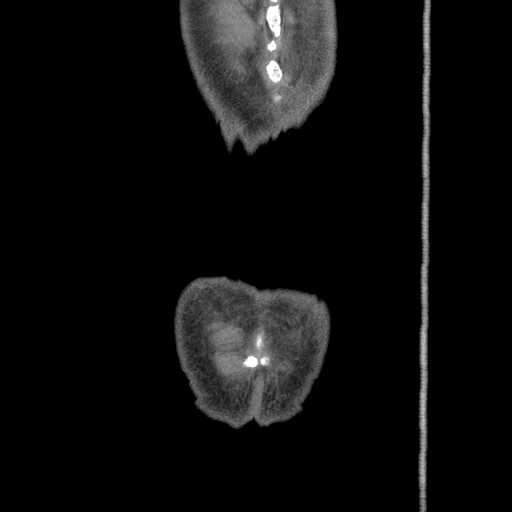
[im 112/120  soft-tissue]
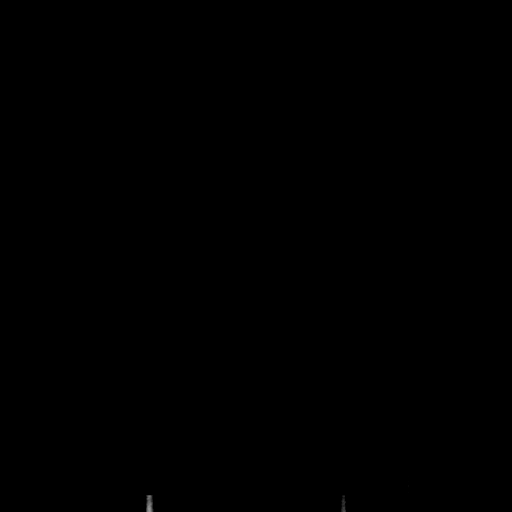

[14 of 34 positions shown; findings below may reference images not displayed]

FINDINGS: Lower chest: Lung bases are clear.

Hepatobiliary: No focal hepatic lesion. No biliary duct dilatation.
Gallbladder is normal. Common bile duct is normal.

Pancreas: No pancreatic inflammation or duct dilatation.

Spleen: Normal spleen

Adrenals/urinary tract: Peripelvic cysts in the RIGHT kidney. There
is mild hydroureter of the RIGHT kidney collecting system. There is
no obstructing lesion identified. LEFT kidney is normal but smaller
than the RIGHT. Normal bladder. Prostate does indent base the
bladder.

Stomach/Bowel: Stomach, small bowel, appendix, and cecum are normal.
The colon and rectosigmoid colon are normal.

Vascular/Lymphatic: There is diffuse wall thickening involving the
entirety of the abdominal aorta from the level of the superior
mesenteric artery through the iliac arteries. This wall thickening
measures up to 6 mm (image 34, series 3) in the abdominal aorta and
up to 8 mm in the external iliac artery on the LEFT (image 49,
series 3). There is no stranding in the adjacent fat along the
course of the aortic inflammation. The abdominal aorta lumen is non
aneurysmal at 18 mm. There is scattered calcification of the aorta.
The branches of the aorta opacified.

No retroperitoneal periportal adenopathy. There are small
retroperitoneal lymph nodes measuring 8-9 mm. Periportal lymph node
measuring 10 mm.

Reproductive: Prostate is lobular. Prostate indents the base the
bladder. Brachytherapy seeds in the prostate gland.

Other: No free fluid.

Musculoskeletal: No aggressive osseous lesion.
IMPRESSION: 1. Dominant finding is long segment of vasculitis involving the
entirety of the abdominal aorta and extending into the iliac
arteries. Recommend rheumatological evaluation as to the etiology of
vasculitis.
2. Dilatation of the RIGHT ureter without obstructing lesion.
3. Nodular indentation of the base the bladder from prostate.
4. LEFT kidney small compared to the RIGHT.

## 2015-06-04 MED ORDER — IOPAMIDOL (ISOVUE-300) INJECTION 61%
75.0000 mL | Freq: Once | INTRAVENOUS | Status: AC | PRN
  Administered 2015-06-04: 75 mL via INTRAVENOUS

## 2015-06-04 MED ORDER — IOHEXOL 300 MG/ML  SOLN
30.0000 mL | Freq: Once | INTRAMUSCULAR | Status: AC | PRN
  Administered 2015-06-04: 30 mL via ORAL

## 2015-06-08 ENCOUNTER — Other Ambulatory Visit: Payer: Self-pay | Admitting: Internal Medicine

## 2015-06-08 ENCOUNTER — Ambulatory Visit: Payer: Self-pay | Admitting: Internal Medicine

## 2015-06-08 DIAGNOSIS — M545 Low back pain: Secondary | ICD-10-CM | POA: Diagnosis not present

## 2015-06-08 DIAGNOSIS — M47816 Spondylosis without myelopathy or radiculopathy, lumbar region: Secondary | ICD-10-CM | POA: Diagnosis not present

## 2015-06-08 DIAGNOSIS — M5136 Other intervertebral disc degeneration, lumbar region: Secondary | ICD-10-CM | POA: Diagnosis not present

## 2015-06-08 DIAGNOSIS — I776 Arteritis, unspecified: Secondary | ICD-10-CM

## 2015-06-08 DIAGNOSIS — M9903 Segmental and somatic dysfunction of lumbar region: Secondary | ICD-10-CM | POA: Diagnosis not present

## 2015-06-09 DIAGNOSIS — M47816 Spondylosis without myelopathy or radiculopathy, lumbar region: Secondary | ICD-10-CM | POA: Diagnosis not present

## 2015-06-09 DIAGNOSIS — M545 Low back pain: Secondary | ICD-10-CM | POA: Diagnosis not present

## 2015-06-09 DIAGNOSIS — M5136 Other intervertebral disc degeneration, lumbar region: Secondary | ICD-10-CM | POA: Diagnosis not present

## 2015-06-09 DIAGNOSIS — M9903 Segmental and somatic dysfunction of lumbar region: Secondary | ICD-10-CM | POA: Diagnosis not present

## 2015-06-10 ENCOUNTER — Encounter: Payer: Self-pay | Admitting: Internal Medicine

## 2015-06-10 ENCOUNTER — Non-Acute Institutional Stay: Payer: Medicare Other | Admitting: Internal Medicine

## 2015-06-10 ENCOUNTER — Other Ambulatory Visit: Payer: Medicare Other

## 2015-06-10 ENCOUNTER — Encounter: Payer: Medicare Other | Admitting: Internal Medicine

## 2015-06-10 VITALS — BP 130/70 | HR 61 | Temp 98.0°F | Ht 73.0 in | Wt 162.0 lb

## 2015-06-10 DIAGNOSIS — M79606 Pain in leg, unspecified: Secondary | ICD-10-CM

## 2015-06-10 DIAGNOSIS — M316 Other giant cell arteritis: Secondary | ICD-10-CM

## 2015-06-10 DIAGNOSIS — R1031 Right lower quadrant pain: Secondary | ICD-10-CM | POA: Diagnosis not present

## 2015-06-10 NOTE — Progress Notes (Signed)
Location: Occupational psychologist of Service:  Clinic (12)  Provider: Isabela Nardelli L. Mariea Clonts, D.O., C.M.D.  Code Status: DNR Goals of Care:  Advanced Directives 06/10/2015  Does patient have an advance directive? Yes  Type of Paramedic of [place];Out of facility DNR (pink MOST or yellow form)  Copy of advanced directive(s) in chart? Yes  Pre-existing out of facility DNR order (yellow form or pink MOST form) Yellow form placed in chart (order not valid for inpatient use)     Chief Complaint  Patient presents with  . Follow-up    to discuss CT scan    HPI: Patient is a 82 y.o. male seen today for an acute visit for discussion of CT scan of abdomen/pelvis.    Did have extensive radiation for his prostate.  Legs are aching and he's more short of breath after a much shorter distance.  Energy improved for a while after his throat cancer and his prostate cancer.    Has to catheterize since his treatment fifteen years ago due to the prostate indenting on the bladder.   +  Past Medical History  Diagnosis Date  . Urethral stricture   . Urinary retention with incomplete bladder emptying   . History of prostate cancer DX  2002    S/P EXTERNAL RADIATION/ RADIOACTIVE SEED IMPLANTS  2003  . OSA (obstructive sleep apnea) intolerant cpap  . Self-catheterizes urinary bladder     QID   AND   PRN  . HTN (hypertension)   . Saliva decreased   . S/P radiation therapy ended 01-26-2012       for tonsillar cancer  . Restless legs syndrome (RLS) 10/31/2011  . Nocturia 08/08/2011  . Vitamin B 12 deficiency 01/28/2011  . Vitamin D deficiency   . Hyperlipemia   . DDD (degenerative disc disease)   . History of prostate cancer     S/P  RADIACTIVE PROSTATE SEED IMPLANTS 2003  . Bladder neck contracture   . History of squamous cell carcinoma excision     2012--  RIGHT LOWER EXTREM  . Hypothyroidism   . BPH (benign prostatic hyperplasia)   . GERD  (gastroesophageal reflux disease)   . Full dentures   . Tonsillar cancer ([place]) 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 ---- dx oct 2013  ----s/p radiation/  ended 01-26-2012-- no surgical intervention---  residuals ( dry mouth, decreased saliva)    oncologist at [facility]--  dr brizel--  HX OF -- NO RECURRENCE  . Macular degeneration     Past Surgical History  Procedure Laterality Date  . Radioactive seed implants, prostate  JAN  2003  . Cysto/ balloon dilation of  urethral stricture  12-25-2010  . Cystoscopy with urethral dilatation  05/31/2011    Procedure: CYSTOSCOPY WITH URETHRAL DILATATION;  Surgeon: Reece Packer, MD;  Location: [facility];  Service: Urology;  Laterality: N/A;  BALLOON DILATION  . Cataract extraction w/ intraocular lens  implant, bilateral    . Cystoscopy/retrograde/ureteroscopy N/A 05/22/2012    Procedure: CYSTOSCOPY BALLOON DILATION RETROGRADE URETEROGRAM ;  Surgeon: Reece Packer, MD;  Location: [facility];  Service: Urology;  Laterality: N/A;  . Skin lesion excision  07/2012    MOST  right shoulder  . Inguinal hernia repair Right 2000  . Cytso/  dilatation urethral stricture/  bx prostatic urethra/  removal foreign bodies  06-25-2010   [facility]  . Balloon dilation N/A 11/10/2014  Procedure: CYSTO BALLOON DILATION AND RETROGRADE URETHROGRAM ;  Surgeon: Bjorn Loser, MD;  Location: [facility];  Service: Urology;  Laterality: N/A;    Allergies  Allergen Reactions  . Cephalosporins Rash  . Suprax [Cefixime] Rash      Medication List       This list is accurate as of: 06/10/15  9:47 AM.  Always use your most recent med list.               ASHWAGANDHA PO  Take by mouth daily.     buPROPion 100 MG tablet  Commonly known as:  WELLBUTRIN  TAKE ONE TABLET BY MOUTH TWICE DAILY     CALCIUM PO  Take 800 mg by mouth daily. TWO  400 MG TAB     COLLAGEN PO  Take 600 mg by mouth 2 (two)  times daily. Take 600 mg twice daily.     COLOSTRUM PO  Take by mouth. One teaspoon daily 1 oz of water     CoQ10 100 MG Caps  Take 100 mg by mouth daily. Take one daily     cyanocobalamin 100 MCG tablet  Take 100 mcg by mouth daily.     DIGESTIVE ENZYMES PO  Take 500 mg by mouth every morning.     fish oil-omega-3 fatty acids 1000 MG capsule  Take 2 g by mouth daily.     GLUCOSAMINE-CHONDROITIN-MSM PO  Take 500 mg by mouth 2 (two) times daily. Take 500 mg twice daily.     HYALURONIC ACID PO  Take by mouth. Take 100mg  twice daily for joinits     lisinopril 10 MG tablet  Commonly known as:  PRINIVIL,ZESTRIL  TAKE ONE TABLET BY MOUTH ONCE DAILY.     multivitamins ther. w/minerals Tabs tablet  Take 2 tablets by mouth 2 (two) times daily.     NATURAL VITAMIN E 400 UNIT capsule  Generic drug:  vitamin E  Take 400 Units by mouth daily.     RESVERATROL PO  Take 100 mg by mouth 2 (two) times daily. Take 2 100 mg tablets twice daily.     Thyroid 113.75 MG Tabs  Commonly known as:  NATURE-THROID  Take 1 tablet by mouth daily before breakfast. At least 30 mins before breakfast or any other medications     TURMERIC CURCUMIN PO  Take 650 mg by mouth every morning.     vitamin C 1000 MG tablet  Take 1,000 mg by mouth daily.     Vitamin D3 5000 units Caps  Take by mouth.     VITAMIN K2 PO  Take by mouth daily.        Review of Systems:  Review of Systems  Constitutional: Negative for fever and chills.  HENT: Negative for hearing loss.   Eyes: Negative for blurred vision.  Respiratory: Negative for shortness of breath.   Cardiovascular: Negative for chest pain, palpitations and leg swelling.  Gastrointestinal: Positive for abdominal pain and constipation. Negative for nausea, vomiting, diarrhea, blood in stool and melena.  Genitourinary: Negative for dysuria.       Has catheter for urinary retention since prostate cancer XRT  Musculoskeletal: Negative for joint pain.   Neurological: Negative for dizziness and headaches.  Psychiatric/Behavioral: Negative for memory loss. The patient has insomnia.        Sleep apnea and RLS in need of f/u but now has canceled these due to his vasculitis finding    Health Maintenance  Topic Date Due  .  ZOSTAVAX  02/25/1994  . TETANUS/TDAP  01/17/2013  . INFLUENZA VACCINE  08/18/2015  . PNA vac Low Risk Adult  Completed    Physical Exam: Filed Vitals:   06/10/15 0937  BP: 130/70  Pulse: 61  Temp: 98 F (36.7 C)  TempSrc: Oral  Height: 6\' 1"  (1.854 m)  Weight: 162 lb (73.483 kg)  SpO2: 98%   Body mass index is 21.38 kg/(m^2). Physical Exam  Constitutional: He is oriented to person, place, and time. He appears well-developed and well-nourished. No distress.  Cardiovascular: Normal rate, regular rhythm, normal heart sounds and intact distal pulses.   Pulmonary/Chest: Effort normal and breath sounds normal. No respiratory distress.  Abdominal: Soft. Bowel sounds are normal. He exhibits mass. He exhibits no distension. There is no rebound and no guarding.  Hard area remains in RLQ, mildly tender; no bruit audible, but pulse of aorta notable in thin pt  Musculoskeletal: Normal range of motion.  Neurological: He is alert and oriented to person, place, and time.  Skin: Skin is warm and dry.  Psychiatric: He has a normal mood and affect.    Labs reviewed: Basic Metabolic Panel:  Recent Labs  07/01/14 2251 09/18/14 11/10/14 1115 12/18/14 12/30/14 05/12/15  NA 135 145 140 139  --   --   K 4.1 4.2 4.2 4.6  --   --   CL 105  --   --   --   --   --   CO2 22  --   --   --   --   --   GLUCOSE 120*  --  95  --   --   --   BUN 26* 27*  --  27*  --   --   CREATININE 1.59* 1.6*  --  1.6*  --   --   CALCIUM 8.4*  --   --   --   --   --   TSH  --  0.15*  --  0.06* 0.04* 0.57   Liver Function Tests:  Recent Labs  07/01/14 2251 09/18/14 12/18/14  AST 17 15 15   ALT 14* 12 13  ALKPHOS 69 61 71  BILITOT 1.0  --    --   PROT 7.3  --   --   ALBUMIN 3.5  --   --     Recent Labs  07/01/14 2251  LIPASE 27   No results for input(s): AMMONIA in the last 8760 hours. CBC:  Recent Labs  07/01/14 2251 09/18/14 11/10/14 1115 12/18/14  WBC 9.8 7.0  --  8.5  NEUTROABS 8.0*  --   --   --   HGB 12.7* 13.4* 13.9 13.7  HCT 37.0* 41 41.0 41  MCV 90.7  --   --   --   PLT 196 213  --  262   Lipid Panel:  Recent Labs  12/18/14  CHOL 221*  HDL 59  LDLCALC 148  TRIG 97   No results found for: HGBA1C  Procedures since last visit: Ct Abdomen Pelvis W Contrast  06/04/2015  CLINICAL DATA:  Lower abdominal pain.  Worse when lying flat. EXAM: CT ABDOMEN AND PELVIS WITH CONTRAST TECHNIQUE: Multidetector CT imaging of the abdomen and pelvis was performed using the standard protocol following bolus administration of intravenous contrast. CONTRAST:  40mL ISOVUE-300 IOPAMIDOL (ISOVUE-300) INJECTION 61%, 82mL OMNIPAQUE IOHEXOL 300 MG/ML SOLN COMPARISON:  None. FINDINGS: Lower chest: Lung bases are clear. Hepatobiliary: No focal hepatic lesion. No biliary duct dilatation. Gallbladder  is normal. Common bile duct is normal. Pancreas: No pancreatic inflammation or duct dilatation. Spleen: Normal spleen Adrenals/urinary tract: Peripelvic cysts in the RIGHT kidney. There is mild hydroureter of the RIGHT kidney collecting system. There is no obstructing lesion identified. LEFT kidney is normal but smaller than the RIGHT. Normal bladder. Prostate does indent base the bladder. Stomach/Bowel: Stomach, small bowel, appendix, and cecum are normal. The colon and rectosigmoid colon are normal. Vascular/Lymphatic: There is diffuse wall thickening involving the entirety of the abdominal aorta from the level of the superior mesenteric artery through the iliac arteries. This wall thickening measures up to 6 mm (image 34, series 3) in the abdominal aorta and up to 8 mm in the external iliac artery on the LEFT (image 49, series 3). There is no  stranding in the adjacent fat along the course of the aortic inflammation. The abdominal aorta lumen is non aneurysmal at 18 mm. There is scattered calcification of the aorta. The branches of the aorta opacified. No retroperitoneal periportal adenopathy. There are small retroperitoneal lymph nodes measuring 8-9 mm. Periportal lymph node measuring 10 mm. Reproductive: Prostate is lobular. Prostate indents the base the bladder. Brachytherapy seeds in the prostate gland. Other: No free fluid. Musculoskeletal: No aggressive osseous lesion. IMPRESSION: 1. Dominant finding is long segment of vasculitis involving the entirety of the abdominal aorta and extending into the iliac arteries. Recommend rheumatological evaluation as to the etiology of vasculitis. 2. Dilatation of the RIGHT ureter without obstructing lesion. 3. Nodular indentation of the base the bladder from prostate. 4. LEFT kidney small compared to the RIGHT. Electronically Signed   By: Suzy Bouchard M.D.   On: 06/04/2015 17:37    Assessment/Plan 1. Large vessel vasculitis (HCC) -noted in aorta on abdominal CT -suspect this is causing his leg and abdominal pains  -referral placed to rheumatology and he would like to see Dr. Charlestine Night -? Any relation to his prior radiation for his prostate -appears typical treatment would be prednisone or methotrexate/folic acid  2. Right lower quadrant abdominal pain -at first I thought the hard area I was feeling was stool and he had some tortuous bowel leading to increased constipation; however, CT has revealed significant vasculitis of the aorta -for visit with rheum  3. Pain of lower extremity, unspecified laterality -has had some increased pain in his legs with activity that he never had before -is generally quite healthy in terms of his eating habits, exercise, etc. So I would not expect claudication from PAD here -suspect also related to iliac involvement of the vasculitis--await rheum  input  Labs/tests ordered: rheum referral placed previously Next appt:  08/19/2015  Jasleen Riepe L. Airel Magadan, D.O. [facility] [address],  [address] Cell Phone (Mon-Fri 8am-5pm):  [phone] On Call:  [phone] & follow prompts after 5pm & weekends Office Phone:  [phone] Office Fax:  [phone]

## 2015-06-16 DIAGNOSIS — M47816 Spondylosis without myelopathy or radiculopathy, lumbar region: Secondary | ICD-10-CM | POA: Diagnosis not present

## 2015-06-16 DIAGNOSIS — M545 Low back pain: Secondary | ICD-10-CM | POA: Diagnosis not present

## 2015-06-16 DIAGNOSIS — M9903 Segmental and somatic dysfunction of lumbar region: Secondary | ICD-10-CM | POA: Diagnosis not present

## 2015-06-16 DIAGNOSIS — M5136 Other intervertebral disc degeneration, lumbar region: Secondary | ICD-10-CM | POA: Diagnosis not present

## 2015-06-18 ENCOUNTER — Other Ambulatory Visit: Payer: Self-pay | Admitting: Internal Medicine

## 2015-06-23 ENCOUNTER — Other Ambulatory Visit: Payer: Self-pay | Admitting: Rheumatology

## 2015-06-23 ENCOUNTER — Ambulatory Visit
Admission: RE | Admit: 2015-06-23 | Discharge: 2015-06-23 | Disposition: A | Discharge status: Home / Self Care | Payer: Medicare Other | Source: Ambulatory Visit | Attending: Rheumatology | Admitting: Rheumatology

## 2015-06-23 DIAGNOSIS — K5900 Constipation, unspecified: Secondary | ICD-10-CM

## 2015-06-23 DIAGNOSIS — M25472 Effusion, left ankle: Secondary | ICD-10-CM | POA: Diagnosis not present

## 2015-06-23 DIAGNOSIS — R109 Unspecified abdominal pain: Secondary | ICD-10-CM

## 2015-06-23 DIAGNOSIS — M25471 Effusion, right ankle: Secondary | ICD-10-CM | POA: Diagnosis not present

## 2015-06-23 DIAGNOSIS — R195 Other fecal abnormalities: Secondary | ICD-10-CM | POA: Diagnosis not present

## 2015-06-23 DIAGNOSIS — I776 Arteritis, unspecified: Secondary | ICD-10-CM | POA: Diagnosis not present

## 2015-06-24 DIAGNOSIS — M25472 Effusion, left ankle: Secondary | ICD-10-CM | POA: Diagnosis not present

## 2015-06-24 DIAGNOSIS — R109 Unspecified abdominal pain: Secondary | ICD-10-CM | POA: Diagnosis not present

## 2015-06-24 DIAGNOSIS — I776 Arteritis, unspecified: Secondary | ICD-10-CM | POA: Diagnosis not present

## 2015-06-24 DIAGNOSIS — M25471 Effusion, right ankle: Secondary | ICD-10-CM | POA: Diagnosis not present

## 2015-06-24 DIAGNOSIS — K5900 Constipation, unspecified: Secondary | ICD-10-CM | POA: Diagnosis not present

## 2015-06-29 DIAGNOSIS — C44529 Squamous cell carcinoma of skin of other part of trunk: Secondary | ICD-10-CM | POA: Diagnosis not present

## 2015-06-29 DIAGNOSIS — R1031 Right lower quadrant pain: Secondary | ICD-10-CM | POA: Diagnosis not present

## 2015-06-29 DIAGNOSIS — L821 Other seborrheic keratosis: Secondary | ICD-10-CM | POA: Diagnosis not present

## 2015-06-29 DIAGNOSIS — D045 Carcinoma in situ of skin of trunk: Secondary | ICD-10-CM | POA: Diagnosis not present

## 2015-06-29 DIAGNOSIS — R195 Other fecal abnormalities: Secondary | ICD-10-CM | POA: Diagnosis not present

## 2015-06-29 DIAGNOSIS — C61 Malignant neoplasm of prostate: Secondary | ICD-10-CM | POA: Diagnosis not present

## 2015-06-29 DIAGNOSIS — L570 Actinic keratosis: Secondary | ICD-10-CM | POA: Diagnosis not present

## 2015-06-29 DIAGNOSIS — R1032 Left lower quadrant pain: Secondary | ICD-10-CM | POA: Diagnosis not present

## 2015-06-29 DIAGNOSIS — M316 Other giant cell arteritis: Secondary | ICD-10-CM | POA: Diagnosis not present

## 2015-06-29 DIAGNOSIS — D485 Neoplasm of uncertain behavior of skin: Secondary | ICD-10-CM | POA: Diagnosis not present

## 2015-06-30 DIAGNOSIS — Z923 Personal history of irradiation: Secondary | ICD-10-CM | POA: Diagnosis not present

## 2015-06-30 DIAGNOSIS — Z9221 Personal history of antineoplastic chemotherapy: Secondary | ICD-10-CM | POA: Diagnosis not present

## 2015-06-30 DIAGNOSIS — I6522 Occlusion and stenosis of left carotid artery: Secondary | ICD-10-CM | POA: Diagnosis not present

## 2015-06-30 DIAGNOSIS — E039 Hypothyroidism, unspecified: Secondary | ICD-10-CM | POA: Diagnosis not present

## 2015-06-30 DIAGNOSIS — Z85818 Personal history of malignant neoplasm of other sites of lip, oral cavity, and pharynx: Secondary | ICD-10-CM | POA: Diagnosis not present

## 2015-06-30 DIAGNOSIS — I791 Aortitis in diseases classified elsewhere: Secondary | ICD-10-CM | POA: Diagnosis not present

## 2015-06-30 DIAGNOSIS — Z08 Encounter for follow-up examination after completed treatment for malignant neoplasm: Secondary | ICD-10-CM | POA: Diagnosis not present

## 2015-06-30 DIAGNOSIS — Z79899 Other long term (current) drug therapy: Secondary | ICD-10-CM | POA: Diagnosis not present

## 2015-07-01 DIAGNOSIS — R7982 Elevated C-reactive protein (CRP): Secondary | ICD-10-CM | POA: Diagnosis not present

## 2015-07-01 DIAGNOSIS — M8589 Other specified disorders of bone density and structure, multiple sites: Secondary | ICD-10-CM | POA: Diagnosis not present

## 2015-07-01 DIAGNOSIS — R109 Unspecified abdominal pain: Secondary | ICD-10-CM | POA: Diagnosis not present

## 2015-07-01 DIAGNOSIS — I776 Arteritis, unspecified: Secondary | ICD-10-CM | POA: Diagnosis not present

## 2015-07-01 DIAGNOSIS — R801 Persistent proteinuria, unspecified: Secondary | ICD-10-CM | POA: Diagnosis not present

## 2015-07-01 DIAGNOSIS — N189 Chronic kidney disease, unspecified: Secondary | ICD-10-CM | POA: Diagnosis not present

## 2015-07-01 DIAGNOSIS — K5900 Constipation, unspecified: Secondary | ICD-10-CM | POA: Diagnosis not present

## 2015-07-03 ENCOUNTER — Other Ambulatory Visit: Payer: Self-pay | Admitting: Rheumatology

## 2015-07-07 ENCOUNTER — Other Ambulatory Visit: Payer: Self-pay | Admitting: Rheumatology

## 2015-07-07 DIAGNOSIS — I701 Atherosclerosis of renal artery: Secondary | ICD-10-CM

## 2015-07-17 ENCOUNTER — Other Ambulatory Visit: Payer: Medicare Other

## 2015-07-20 ENCOUNTER — Ambulatory Visit
Admission: RE | Admit: 2015-07-20 | Discharge: 2015-07-20 | Disposition: A | Discharge status: Home / Self Care | Payer: Medicare Other | Source: Ambulatory Visit | Attending: Rheumatology | Admitting: Rheumatology

## 2015-07-20 DIAGNOSIS — I776 Arteritis, unspecified: Secondary | ICD-10-CM | POA: Diagnosis not present

## 2015-07-20 DIAGNOSIS — I701 Atherosclerosis of renal artery: Secondary | ICD-10-CM | POA: Diagnosis not present

## 2015-07-20 DIAGNOSIS — R809 Proteinuria, unspecified: Secondary | ICD-10-CM | POA: Diagnosis not present

## 2015-07-20 DIAGNOSIS — R109 Unspecified abdominal pain: Secondary | ICD-10-CM | POA: Diagnosis not present

## 2015-07-20 DIAGNOSIS — N281 Cyst of kidney, acquired: Secondary | ICD-10-CM | POA: Diagnosis not present

## 2015-07-20 DIAGNOSIS — K5900 Constipation, unspecified: Secondary | ICD-10-CM | POA: Diagnosis not present

## 2015-07-20 DIAGNOSIS — R799 Abnormal finding of blood chemistry, unspecified: Secondary | ICD-10-CM | POA: Diagnosis not present

## 2015-07-23 DIAGNOSIS — M8589 Other specified disorders of bone density and structure, multiple sites: Secondary | ICD-10-CM | POA: Diagnosis not present

## 2015-07-23 DIAGNOSIS — R600 Localized edema: Secondary | ICD-10-CM | POA: Diagnosis not present

## 2015-07-23 DIAGNOSIS — I10 Essential (primary) hypertension: Secondary | ICD-10-CM | POA: Diagnosis not present

## 2015-07-23 DIAGNOSIS — I776 Arteritis, unspecified: Secondary | ICD-10-CM | POA: Diagnosis not present

## 2015-07-23 DIAGNOSIS — N183 Chronic kidney disease, stage 3 (moderate): Secondary | ICD-10-CM | POA: Diagnosis not present

## 2015-07-24 ENCOUNTER — Other Ambulatory Visit: Payer: Self-pay | Admitting: Rheumatology

## 2015-07-24 DIAGNOSIS — I776 Arteritis, unspecified: Secondary | ICD-10-CM

## 2015-07-27 DIAGNOSIS — Z8546 Personal history of malignant neoplasm of prostate: Secondary | ICD-10-CM | POA: Diagnosis not present

## 2015-07-27 DIAGNOSIS — H532 Diplopia: Secondary | ICD-10-CM | POA: Diagnosis not present

## 2015-07-27 DIAGNOSIS — H353130 Nonexudative age-related macular degeneration, bilateral, stage unspecified: Secondary | ICD-10-CM | POA: Diagnosis not present

## 2015-07-27 DIAGNOSIS — Z961 Presence of intraocular lens: Secondary | ICD-10-CM | POA: Diagnosis not present

## 2015-07-27 DIAGNOSIS — Z85819 Personal history of malignant neoplasm of unspecified site of lip, oral cavity, and pharynx: Secondary | ICD-10-CM | POA: Diagnosis not present

## 2015-07-28 ENCOUNTER — Emergency Department (HOSPITAL_BASED_OUTPATIENT_CLINIC_OR_DEPARTMENT_OTHER)
Admission: EM | Admit: 2015-07-28 | Discharge: 2015-07-28 | Disposition: A | Discharge status: Home / Self Care | Payer: Medicare Other | Attending: Emergency Medicine | Admitting: Emergency Medicine

## 2015-07-28 ENCOUNTER — Encounter (HOSPITAL_BASED_OUTPATIENT_CLINIC_OR_DEPARTMENT_OTHER): Payer: Self-pay | Admitting: *Deleted

## 2015-07-28 DIAGNOSIS — R300 Dysuria: Secondary | ICD-10-CM | POA: Diagnosis present

## 2015-07-28 DIAGNOSIS — E039 Hypothyroidism, unspecified: Secondary | ICD-10-CM | POA: Insufficient documentation

## 2015-07-28 DIAGNOSIS — I10 Essential (primary) hypertension: Secondary | ICD-10-CM | POA: Insufficient documentation

## 2015-07-28 DIAGNOSIS — E785 Hyperlipidemia, unspecified: Secondary | ICD-10-CM | POA: Insufficient documentation

## 2015-07-28 DIAGNOSIS — N390 Urinary tract infection, site not specified: Secondary | ICD-10-CM | POA: Diagnosis not present

## 2015-07-28 LAB — URINALYSIS, ROUTINE W REFLEX MICROSCOPIC
BILIRUBIN URINE: NEGATIVE
Glucose, UA: NEGATIVE mg/dL
KETONES UR: NEGATIVE mg/dL
NITRITE: NEGATIVE
PH: 7 (ref 5.0–8.0)
Protein, ur: NEGATIVE mg/dL
Specific Gravity, Urine: 1.014 (ref 1.005–1.030)

## 2015-07-28 LAB — URINE MICROSCOPIC-ADD ON

## 2015-07-28 MED ORDER — CIPROFLOXACIN HCL 500 MG PO TABS
500.0000 mg | ORAL_TABLET | Freq: Once | ORAL | Status: AC
  Administered 2015-07-28: 500 mg via ORAL
  Filled 2015-07-28: qty 1

## 2015-07-28 MED ORDER — CIPROFLOXACIN HCL 500 MG PO TABS: 500.0000 mg | ORAL_TABLET | Freq: Two times a day (BID) | ORAL | Status: DC

## 2015-07-28 NOTE — ED Notes (Signed)
Pt freq dysuria x 12 hrs , pt self caths.

## 2015-07-28 NOTE — Discharge Instructions (Signed)

## 2015-07-28 NOTE — ED Provider Notes (Signed)
By signing my name below, I, Reola Mosher, attest that this documentation has been prepared under the direction and in the presence of [place], DO.  Electronically Signed: Reola Mosher, ED Scribe. 07/28/2015. 11:25 PM.  TIME SEEN: 11:21 PM  CHIEF COMPLAINT:  Chief Complaint  Patient presents with  . Dysuria   HPI: HPI Comments: Philip [name] is a 82 y.o. male with a PMHx of prostate cancer, urethral stricture, urinary retention, HTN, hypothyroidism, BPH, and GERD who presents to the Emergency Department complaining of gradual onset, intermittent Urinary frequency onset 12 hours ago. Pt reports that he has been having associated generalized malaise since the onset of his symptoms. Pt notes that he has a hx of UTIs, and he usually takes Ciprofloxacin for his symptoms. His symptoms today are similar to his hx of UTIs. He states that his last UTI was "a couple of years ago". He notes that he has had to self-catheterize since his dx of Prostate cancer 15 years ago. Pt denies fever, nausea, vomiting, diarrhea, chest pain, shortness of breath, cough, dysuria, hematuria, or chills.   ROS: See HPI Constitutional: no fever  Eyes: no drainage  ENT: no runny nose   Cardiovascular:  no chest pain  Resp: no SOB  GI: no vomiting GU: no dysuria Integumentary: no rash  Allergy: no hives  Musculoskeletal: no leg swelling  Neurological: no slurred speech ROS otherwise negative  PAST MEDICAL HISTORY/PAST SURGICAL HISTORY:  Past Medical History  Diagnosis Date  . Urethral stricture   . Urinary retention with incomplete bladder emptying   . History of prostate cancer DX  2002    S/P EXTERNAL RADIATION/ RADIOACTIVE SEED IMPLANTS  2003  . OSA (obstructive sleep apnea) intolerant cpap  . Self-catheterizes urinary bladder     QID   AND   PRN  . HTN (hypertension)   . Saliva decreased   . S/P radiation therapy ended 01-26-2012       for tonsillar cancer  . Restless legs  syndrome (RLS) 10/31/2011  . Nocturia 08/08/2011  . Vitamin B 12 deficiency 01/28/2011  . Vitamin D deficiency   . Hyperlipemia   . DDD (degenerative disc disease)   . History of prostate cancer     S/P  RADIACTIVE PROSTATE SEED IMPLANTS 2003  . Bladder neck contracture   . History of squamous cell carcinoma excision     2012--  RIGHT LOWER EXTREM  . Hypothyroidism   . BPH (benign prostatic hyperplasia)   . GERD (gastroesophageal reflux disease)   . Full dentures   . Tonsillar cancer ([place]) 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 ---- dx oct 2013  ----s/p radiation/  ended 01-26-2012-- no surgical intervention---  residuals ( dry mouth, decreased saliva)    oncologist at [place]--  dr brizel--  HX OF -- NO RECURRENCE  . Macular degeneration     MEDICATIONS:  Prior to Admission medications   Medication Sig Start Date End Date Taking? Authorizing Provider  Ascorbic Acid (VITAMIN C) 1000 MG tablet Take 1,000 mg by mouth daily.    Historical Provider, MD  ASHWAGANDHA PO Take by mouth daily.    Historical Provider, MD  CALCIUM PO Take 800 mg by mouth daily. TWO  400 MG TAB    Historical Provider, MD  Cholecalciferol (VITAMIN D3) 5000 UNITS CAPS Take by mouth.    Historical Provider, MD  Coenzyme Q10 (COQ10) 100 MG CAPS Take 100 mg by mouth daily. Take one daily  Historical Provider, MD  COLLAGEN PO Take 600 mg by mouth 2 (two) times daily. Take 600 mg twice daily.    Historical Provider, MD  COLOSTRUM PO Take by mouth. One teaspoon daily 1 oz of water    Historical Provider, MD  cyanocobalamin 100 MCG tablet Take 100 mcg by mouth daily.    Historical Provider, MD  DIGESTIVE ENZYMES PO Take 500 mg by mouth every morning.     Historical Provider, MD  fish oil-omega-3 fatty acids 1000 MG capsule Take 2 g by mouth daily.    Historical Provider, MD  GLUCOSAMINE-CHONDROITIN-MSM PO Take 500 mg by mouth 2 (two) times daily. Take 500 mg twice daily.    Historical Provider, MD   Hyaluronic Acid-Vitamin C (HYALURONIC ACID PO) Take by mouth. Take 100mg  twice daily for joinits    Historical Provider, MD  Menaquinone-7 (VITAMIN K2 PO) Take by mouth daily.    Historical Provider, MD  Multiple Vitamins-Minerals (MULTIVITAMINS THER. W/MINERALS) TABS Take 2 tablets by mouth 2 (two) times daily.     Historical Provider, MD  RESVERATROL PO Take 100 mg by mouth 2 (two) times daily. Take 2 100 mg tablets twice daily.    Historical Provider, MD  Thyroid (NATURE-THROID) 113.75 MG TABS Take 1 tablet by mouth daily before breakfast. At least 30 mins before breakfast or any other medications 01/06/15   Tiffany L Reed, DO  TURMERIC CURCUMIN PO Take 650 mg by mouth every morning.    Historical Provider, MD  vitamin E (NATURAL VITAMIN E) 400 UNIT capsule Take 400 Units by mouth daily.    Historical Provider, MD    ALLERGIES:  Allergies  Allergen Reactions  . Cephalosporins Rash  . Suprax [Cefixime] Rash    SOCIAL HISTORY:  Social History  Substance Use Topics  . Smoking status: Never Smoker   . Smokeless tobacco: Never Used  . Alcohol Use: 4.2 oz/week    7 Glasses of wine per week     Comment: 8oz    FAMILY HISTORY: Family History  Problem Relation Age of Onset  . Stroke Father     EXAM: BP 139/94 mmHg  Pulse 74  Temp(Src) 98.7 F (37.1 C)  Resp 16  Ht 5\' 11"  (1.803 m)  Wt 160 lb (72.576 kg)  BMI 22.33 kg/m2  SpO2 97%   CONSTITUTIONAL: Elderly but appears very young for his age, afebrile, non-toxic, well-hydrated. Alert and oriented and responds appropriately to questions. Well-appearing; well-nourished. HEAD: Normocephalic EYES: Conjunctivae clear, PERRL ENT: normal nose; no rhinorrhea; moist mucous membranes NECK: Supple, no meningismus, no LAD  CARD: RRR; S1 and S2 appreciated; no murmurs, no clicks, no rubs, no gallops RESP: Normal chest excursion without splinting or tachypnea; breath sounds clear and equal bilaterally; no wheezes, no rhonchi, no rales,  no hypoxia or respiratory distress, speaking full sentences ABD/GI: Normal bowel sounds; non-distended; soft, non-tender, no rebound, no guarding, no peritoneal signs BACK:  The back appears normal and is non-tender to palpation, there is no CVA tenderness EXT: Normal ROM in all joints; non-tender to palpation; no edema; normal capillary refill; no cyanosis, no calf tenderness or swelling    SKIN: Normal color for age and race; warm; no rash NEURO: Moves all extremities equally, sensation to light touch intact diffusely, cranial nerves II through XII intact PSYCH: The patient's mood and manner are appropriate. Grooming and personal hygiene are appropriate.  MEDICAL DECISION MAKING: Patient here with symptoms similar to his prior urinary tract infections. Has urinary frequency and  generalized malaise but otherwise is feeling well. Afebrile and nontoxic. Appears well-hydrated. Exam is benign. Abdomen soft and nontender. He denies any other associated symptoms including fevers, cough, chest pain or shortness of breath, vomiting or diarrhea. No focal neurologic deficits. States that this feels exactly like his prior urinary tract infections. Urinalysis just showed blood, leukocytes and bacteria. We'll send urine culture. Previous urine culture and 2 wasn't 10 grew Klebsiella and enterococcus that was sensitive to fluoroquinolones. We'll discharge with Cipro. He does have PCP follow-up. Discussed return cautions with patient and his wife. Patient reports that if his urine culture grows bacteria that is not sensitive to Cipro they would like to be contacted on herself in number which is listed in his chart.   At this time, I do not feel there is any life-threatening condition present. I have reviewed and discussed all results (EKG, imaging, lab, urine as appropriate), exam findings with patient. I have reviewed nursing notes and appropriate previous records.  I feel the patient is safe to be discharged home  without further emergent workup. Discussed usual and customary return precautions. Patient and family (if present) verbalize understanding and are comfortable with this plan.  Patient will follow-up with their primary care provider. If they do not have a primary care provider, information for follow-up has been provided to them. All questions have been answered.   I personally performed the services described in this documentation, which was scribed in my presence. The recorded information has been reviewed and is accurate.     [place], DO 07/29/15 [phone]

## 2015-07-28 NOTE — ED Notes (Signed)
Pt verbalizes understanding of d/c instructions and denies any further needs at this time. 

## 2015-07-31 ENCOUNTER — Ambulatory Visit: Payer: Medicare Other | Admitting: Internal Medicine

## 2015-08-01 LAB — URINE CULTURE

## 2015-08-02 ENCOUNTER — Telehealth (HOSPITAL_BASED_OUTPATIENT_CLINIC_OR_DEPARTMENT_OTHER): Payer: Self-pay

## 2015-08-02 NOTE — Progress Notes (Signed)
ED Antimicrobial Stewardship Positive Culture Follow Up   Philip [name] is an 82 y.o. male who presented to [facility] on 07/28/2015 with a chief complaint of  Chief Complaint  Patient presents with  . Dysuria    Recent Results (from the past 720 hour(s))  Urine culture     Status: Abnormal   Collection Time: 07/28/15 10:25 PM  Result Value Ref Range Status   Specimen Description URINE, RANDOM  Final   Special Requests NONE  Final   Culture (A)  Final    >=100,000 COLONIES/mL VIRIDANS STREPTOCOCCUS Standardized susceptibility testing for this organism is not available. Performed at [facility]    Report Status 08/01/2015 FINAL  Final    [x]  Treated with ciprofloxacin, organism resistant to prescribed antimicrobial []  Patient discharged originally without antimicrobial agent and treatment is now indicated  New antibiotic prescription: levaquin 250mg  PO daily x 7 days  ED Provider: Margarita Mail, PA   Dmarcus Decicco, Rande Lawman 08/02/2015, 11:06 AM Clinical Pharmacist Phone# [phone]

## 2015-08-02 NOTE — Telephone Encounter (Signed)
Post ED Visit - Positive Culture Follow-up: Unsuccessful Patient Follow-up  Culture assessed and recommendations reviewed by: []  Elenor Quinones, Pharm.D. []  Heide Guile, Pharm.D., BCPS []  Parks Neptune, Pharm.D. []  Alycia Rossetti, Pharm.D., BCPS []  [place], Pharm.D., BCPS, AAHIVP []  Legrand Como, Pharm.D., BCPS, AAHIVP []  Milus Glazier, Pharm.D. []  Stephens November, [place].D. Rachel Rumbarger Pharm D Positive urine culture  []  Patient discharged without antimicrobial prescription and treatment is now indicated [x]  Organism is resistant to prescribed ED discharge antimicrobial []  Patient with positive blood cultures   Unable to contact patient after 3 attempts, letter will be sent to address on file  Genia Del 08/02/2015, 2:58 PM

## 2015-08-18 ENCOUNTER — Telehealth (HOSPITAL_BASED_OUTPATIENT_CLINIC_OR_DEPARTMENT_OTHER): Payer: Self-pay | Admitting: Emergency Medicine

## 2015-08-19 ENCOUNTER — Encounter: Payer: Self-pay | Admitting: Internal Medicine

## 2015-08-19 ENCOUNTER — Non-Acute Institutional Stay: Payer: Medicare Other | Admitting: Internal Medicine

## 2015-08-19 VITALS — BP 120/70 | HR 80 | Temp 97.8°F | Wt 165.0 lb

## 2015-08-19 DIAGNOSIS — I701 Atherosclerosis of renal artery: Secondary | ICD-10-CM | POA: Diagnosis not present

## 2015-08-19 DIAGNOSIS — R1031 Right lower quadrant pain: Secondary | ICD-10-CM | POA: Diagnosis not present

## 2015-08-19 DIAGNOSIS — I776 Arteritis, unspecified: Secondary | ICD-10-CM | POA: Diagnosis not present

## 2015-08-19 DIAGNOSIS — M316 Other giant cell arteritis: Secondary | ICD-10-CM

## 2015-08-19 DIAGNOSIS — M79606 Pain in leg, unspecified: Secondary | ICD-10-CM | POA: Diagnosis not present

## 2015-08-19 DIAGNOSIS — I10 Essential (primary) hypertension: Secondary | ICD-10-CM | POA: Diagnosis not present

## 2015-08-19 DIAGNOSIS — N3090 Cystitis, unspecified without hematuria: Secondary | ICD-10-CM

## 2015-08-19 DIAGNOSIS — H6123 Impacted cerumen, bilateral: Secondary | ICD-10-CM | POA: Diagnosis not present

## 2015-08-19 DIAGNOSIS — R600 Localized edema: Secondary | ICD-10-CM | POA: Diagnosis not present

## 2015-08-19 DIAGNOSIS — M8589 Other specified disorders of bone density and structure, multiple sites: Secondary | ICD-10-CM | POA: Diagnosis not present

## 2015-08-19 DIAGNOSIS — N183 Chronic kidney disease, stage 3 (moderate): Secondary | ICD-10-CM | POA: Diagnosis not present

## 2015-08-19 NOTE — Progress Notes (Signed)
Location:   [place] of Service:  Clinic (12)  Provider: Tiffany L. Mariea Clonts, D.O., C.M.D.  Code Status: DNR Goals of Care:  Advanced Directives 07/28/2015  Does patient have an advance directive? No  Type of Advance Directive -  Does patient want to make changes to advanced directive? -  Copy of advanced directive(s) in chart? -  Would patient like information on creating an advanced directive? No - patient declined information  Pre-existing out of facility DNR order (yellow form or pink MOST form) -     Chief Complaint  Patient presents with  . Medical Management of Chronic Issues    3 mth follow-up    HPI: Patient is a 82 y.o. male seen today for medical management of chronic diseases.    Aortitis:  Is aware he has vasculitis in his abdominal aorta.  Was getting tired going up his driveway--aching in his legs.  Dr. Charlestine Night put him first on 29m of prednisone.  Had a rapid increase in energy (could walk 20 yards instead of 10 yards before getting tired).  This has gone away again.  He's now on 358mand follows up next week.  No more abdominal pain.    Had been able to run up the steps two at a time.  Was going upstairs in a grocery store and right leg gave out [place] do that [place] Had never had that experience before.  Walked here today from the house.  Got worn out climbing up a small hill.  Was hoping to get back to table tennis for the senior games at the end of Sept.  Per his wife, they did a lot of traveling with walking and things.  Legs do ache if he walks fast, but not at a slow pace.  Thinks he will try some table tennis Friday.    Had a UTI 7/11--was treated with cipro--resolved. The culture was strep which was not susceptible.  He does not want a repeat b/c he feels fine and his wife wants him to recheck it.  He refuses.    His wife is concerned about too much radiation b/c of his past prostate cancer and throat cancer treatments.  He is due to have a CT  with rheumatology.  Had ESR drawn.    Past Medical History:  Diagnosis Date  . Bladder neck contracture   . BPH (benign prostatic hyperplasia)   . DDD (degenerative disc disease)   . Full dentures   . GERD (gastroesophageal reflux disease)   . History of prostate cancer DX  2002   S/P EXTERNAL RADIATION/ RADIOACTIVE SEED IMPLANTS  2003  . History of prostate cancer    S/P  RADIACTIVE PROSTATE SEED IMPLANTS 2003  . History of squamous cell carcinoma excision    2012--  RIGHT LOWER EXTREM  . HTN (hypertension)   . Hyperlipemia   . Hypothyroidism   . Macular degeneration   . Nocturia 08/08/2011  . OSA (obstructive sleep apnea) intolerant cpap  . Restless legs syndrome (RLS) 10/31/2011  . S/P radiation therapy ended 01-26-2012      for tonsillar cancer  . Saliva decreased   . Self-catheterizes urinary bladder    QID   AND   PRN  . 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 ---- dx oct 2013  ----s/p radiation/  ended 01-26-2012-- no surgical intervention---  residuals ( dry mouth, decreased saliva)   [facility]--  HX OF -- NO RECURRENCE  . Urethral stricture   . Urinary retention with incomplete bladder emptying   . Vitamin B 12 deficiency 01/28/2011  . Vitamin D deficiency     Past Surgical History:  Procedure Laterality Date  . BALLOON DILATION N/A 11/10/2014   Procedure: CYSTO BALLOON DILATION AND RETROGRADE URETHROGRAM ;  Surgeon: Bjorn Loser, MD;  Location: [facility];  Service: Urology;  Laterality: N/A;  . CATARACT EXTRACTION W/ INTRAOCULAR LENS  IMPLANT, BILATERAL    . CYSTO/ BALLOON DILATION OF  URETHRAL STRICTURE  12-25-2010  . CYSTOSCOPY WITH URETHRAL DILATATION  05/31/2011   Procedure: CYSTOSCOPY WITH URETHRAL DILATATION;  Surgeon: Reece Packer, MD;  Location: [place];  Service: Urology;  Laterality: N/A;  BALLOON DILATION  . CYSTOSCOPY/RETROGRADE/URETEROSCOPY N/A  05/22/2012   Procedure: CYSTOSCOPY BALLOON DILATION RETROGRADE URETEROGRAM ;  Surgeon: Reece Packer, MD;  Location: [facility];  Service: Urology;  Laterality: N/A;  . CYTSO/  DILATATION URETHRAL STRICTURE/  BX PROSTATIC URETHRA/  REMOVAL FOREIGN BODIES  06-25-2010   [facility]  . INGUINAL HERNIA REPAIR Right 2000  . RADIOACTIVE SEED IMPLANTS, PROSTATE  JAN  2003  . SKIN LESION EXCISION  07/2012   MOST  right shoulder    Allergies  Allergen Reactions  . Cephalosporins Rash  . Suprax [Cefixime] Rash      Medication List       Accurate as of 08/19/15  9:07 AM. Always use your most recent med list.          ASHWAGANDHA PO Take by mouth daily.   CALCIUM PO Take 800 mg by mouth daily. TWO  400 MG TAB   COLLAGEN PO Take 600 mg by mouth 2 (two) times daily. Take 600 mg twice daily.   COLOSTRUM PO Take by mouth. One teaspoon daily 1 oz of water   CoQ10 100 MG Caps Take 100 mg by mouth daily. Take one daily   cyanocobalamin 100 MCG tablet Take 100 mcg by mouth daily.   DIGESTIVE ENZYMES PO Take 500 mg by mouth every morning.   fish oil-omega-3 fatty acids 1000 MG capsule Take 2 g by mouth daily.   GLUCOSAMINE-CHONDROITIN-MSM PO Take 500 mg by mouth 2 (two) times daily. Take 500 mg twice daily.   HYALURONIC ACID PO Take by mouth. Take 141m twice daily for joinits   multivitamins ther. w/minerals Tabs tablet Take 2 tablets by mouth 2 (two) times daily.   NATURAL VITAMIN E 400 UNIT capsule Generic drug:  vitamin E Take 400 Units by mouth daily.   predniSONE 10 MG tablet Commonly known as:  DELTASONE Take 10 mg by mouth daily with breakfast. Take along with 20 mg   predniSONE 20 MG tablet Commonly known as:  DELTASONE Take 20 mg by mouth daily with breakfast. Take along with 163m  RESVERATROL PO Take 100 mg by mouth 2 (two) times daily. Take 2 100 mg tablets twice daily.   Thyroid 113.75 MG Tabs Commonly known as:  NATURE-THROID Take 1 tablet  by mouth daily before breakfast. At least 30 mins before breakfast or any other medications   TURMERIC CURCUMIN PO Take 650 mg by mouth every morning.   vitamin C 1000 MG tablet Take 1,000 mg by mouth daily.   Vitamin D3 5000 units Caps Take by mouth.   VITAMIN K2 PO Take by mouth daily.       Review of Systems:  Review of Systems  Constitutional: Positive for malaise/fatigue. Negative for fever.  HENT: Negative for hearing loss.   Eyes: Negative for blurred vision.  Respiratory: Negative for shortness of breath.        Does haves some dypsnea on exertion   Cardiovascular: Negative for chest pain, palpitations and leg swelling.  Gastrointestinal: Negative for blood in stool, constipation, melena, nausea and vomiting.  Genitourinary: Positive for frequency. Negative for dysuria and urgency.  Musculoskeletal: Negative for falls and joint pain.  Skin: Negative for itching and rash.  Neurological: Negative for dizziness and loss of consciousness.  Psychiatric/Behavioral: Negative for depression and memory loss. The patient is not nervous/anxious.     Health Maintenance  Topic Date Due  . ZOSTAVAX  02/25/1994  . TETANUS/TDAP  01/17/2013  . INFLUENZA VACCINE  08/18/2015  . PNA vac Low Risk Adult  Completed    Physical Exam: Vitals:   08/19/15 0902  BP: 120/70  Pulse: 80  Temp: 97.8 F (36.6 C)  TempSrc: Oral  SpO2: 95%  Weight: 165 lb (74.8 kg)   Body mass index is 23.01 kg/m. Physical Exam  Constitutional: He is oriented to person, place, and time. He appears well-developed and well-nourished. No distress.  Cardiovascular: Normal rate, regular rhythm, normal heart sounds and intact distal pulses.   Pulmonary/Chest: Effort normal and breath sounds normal. No respiratory distress.  Abdominal: Soft. Bowel sounds are normal.  Musculoskeletal: Normal range of motion.  Neurological: He is alert and oriented to person, place, and time.  Skin: Skin is warm and dry.    Psychiatric: He has a normal mood and affect.    Labs reviewed: Basic Metabolic Panel:  Recent Labs  09/18/14 11/10/14 1115 12/18/14 12/30/14 05/12/15  NA 145 140 139  --   --   K 4.2 4.2 4.6  --   --   GLUCOSE  --  95  --   --   --   BUN 27*  --  27*  --   --   CREATININE 1.6*  --  1.6*  --   --   TSH 0.15*  --  0.06* 0.04* 0.57   Liver Function Tests:  Recent Labs  09/18/14 12/18/14  AST 15 15  ALT 12 13  ALKPHOS 61 71   No results for input(s): LIPASE, AMYLASE in the last 8760 hours. No results for input(s): AMMONIA in the last 8760 hours. CBC:  Recent Labs  09/18/14 11/10/14 1115 12/18/14  WBC 7.0  --  8.5  HGB 13.4* 13.9 13.7  HCT 41 41.0 41  PLT 213  --  262   Lipid Panel:  Recent Labs  12/18/14  CHOL 221*  HDL 59  LDLCALC 148  TRIG 97   No results found for: HGBA1C  Procedures since last visit: US Renal  Result Date: 07/20/2015 CLINICAL DATA:  Renal artery stenosis.  History of prostate cancer. EXAM: RENAL/URINARY TRACT ULTRASOUND RENAL DUPLEX DOPPLER ULTRASOUND COMPARISON:  None. FINDINGS: Right Kidney: Length: 12.4 cm. Echogenicity within normal limits. No mass or hydronephrosis visualized. Contour of the right kidney is lobulated. There are 2 simple cysts. They are both maximally 3.4 cm in diameter. Left Kidney: Length: 9.7 cm. Echogenicity within normal limits. No mass or hydronephrosis visualized. The contour of the kidney is lobulated. Bladder:  Within normal limits. RENAL DUPLEX ULTRASOUND Right Renal Artery Velocities: Origin:  51 cm/sec Mid:  106 cm/sec Hilum:  114 cm/sec Interlobar:  50 cm/sec Arcuate:  20 Cm/sec Left Renal Artery Velocities: Origin:  91 cm/sec  Mid:  39 cm/sec Hilum:  39 cm/sec Interlobar:  27 cm/sec Arcuate:  19 cm/sec Aortic Velocity:  60 Cm/sec Right Renal-Aortic Ratios: Origin: 0.9 Mid:  1.8 Hilum: 1.9 Interlobar: 0.8 Arcuate: 0.3 Left Renal-Aortic Ratios: Origin: 1.5 Mid: 0.7 Hilum: 0.7 Interlobar: 0.5 Arcuate: 0.3 Additional  findings: Maximal diameter of the ascending aorta is 3.1 cm. Atherosclerotic changes are noted. The prostate is 4.8 x 4.1 x 3.9 cm and is mildly enlarged. Right and left renal veins are patent by color flow and Doppler analysis. IMPRESSION: No evidence of significant renal artery stenosis. The left kidney is significantly smaller than that of the right kidney compatible with atrophy. Simple cysts in the right kidney.  They have a benign appearance. Mild enlargement of the prostate gland. Correlation with PSA and physical exam is recommended. Electronically Signed   By: Marybelle Killings M.D.   On: 07/20/2015 09:46   US Renal Artery Stenosis  Result Date: 07/20/2015 CLINICAL DATA:  Renal artery stenosis.  History of prostate cancer. EXAM: RENAL/URINARY TRACT ULTRASOUND RENAL DUPLEX DOPPLER ULTRASOUND COMPARISON:  None. FINDINGS: Right Kidney: Length: 12.4 cm. Echogenicity within normal limits. No mass or hydronephrosis visualized. Contour of the right kidney is lobulated. There are 2 simple cysts. They are both maximally 3.4 cm in diameter. Left Kidney: Length: 9.7 cm. Echogenicity within normal limits. No mass or hydronephrosis visualized. The contour of the kidney is lobulated. Bladder:  Within normal limits. RENAL DUPLEX ULTRASOUND Right Renal Artery Velocities: Origin:  51 cm/sec Mid:  106 cm/sec Hilum:  114 cm/sec Interlobar:  50 cm/sec Arcuate:  20 Cm/sec Left Renal Artery Velocities: Origin:  91 cm/sec Mid:  39 cm/sec Hilum:  39 cm/sec Interlobar:  27 cm/sec Arcuate:  19 cm/sec Aortic Velocity:  60 Cm/sec Right Renal-Aortic Ratios: Origin: 0.9 Mid:  1.8 Hilum: 1.9 Interlobar: 0.8 Arcuate: 0.3 Left Renal-Aortic Ratios: Origin: 1.5 Mid: 0.7 Hilum: 0.7 Interlobar: 0.5 Arcuate: 0.3 Additional findings: Maximal diameter of the ascending aorta is 3.1 cm. Atherosclerotic changes are noted. The prostate is 4.8 x 4.1 x 3.9 cm and is mildly enlarged. Right and left renal veins are patent by color flow and Doppler  analysis. IMPRESSION: No evidence of significant renal artery stenosis. The left kidney is significantly smaller than that of the right kidney compatible with atrophy. Simple cysts in the right kidney.  They have a benign appearance. Mild enlargement of the prostate gland. Correlation with PSA and physical exam is recommended. Electronically Signed   By: Marybelle Killings M.D.   On: 07/20/2015 09:46    Assessment/Plan 1. Large vessel vasculitis (HCC) -aortitis -following with Dr. Charlestine Night -currently on prednisone 36m po daily -has had improvement in claudication type symptoms and resolution of abdominal pain, having some dyspnea on exertion and still with some degree of fatigue in his legs when he walks faster  2. Right lower quadrant abdominal pain -resolved -unclear if this was due to inflammation form vasculitis or constipation  3. Pain of lower extremity, unspecified laterality -due to vasculitis/claudication-like -cont slow prednisone taper and f/u with rheum  4. Cystitis -was treated with cipro but not susceptible to it -interestingly, symptoms entirely resolved though with the treatment -opted not to repeat at this time (he refused after his wife requested it)  5. Cerumen impaction, bilateral -asked clinic nurse to arrange for irrigation of ears with peroxide and warm water  Labs/tests ordered: no new Next appt:  11/18/2015  Philip [name] L. Merdith Boyd, D.O. [facility] [address], [place] [address] Cell Phone (Mon-Fri 8am-5pm):  [phone] On Call:  [phone] & follow prompts after 5pm & weekends Office Phone:  [phone] Office Fax:  [phone]

## 2015-08-20 ENCOUNTER — Ambulatory Visit
Admission: RE | Admit: 2015-08-20 | Discharge: 2015-08-20 | Disposition: A | Discharge status: Home / Self Care | Payer: Medicare Other | Source: Ambulatory Visit | Attending: Rheumatology | Admitting: Rheumatology

## 2015-08-20 DIAGNOSIS — I701 Atherosclerosis of renal artery: Secondary | ICD-10-CM | POA: Diagnosis not present

## 2015-08-20 DIAGNOSIS — I776 Arteritis, unspecified: Secondary | ICD-10-CM

## 2015-08-20 MED ORDER — IOPAMIDOL (ISOVUE-300) INJECTION 61%
60.0000 mL | Freq: Once | INTRAVENOUS | Status: AC | PRN
  Administered 2015-08-20: 60 mL via INTRAVENOUS

## 2015-08-24 DIAGNOSIS — N183 Chronic kidney disease, stage 3 (moderate): Secondary | ICD-10-CM | POA: Diagnosis not present

## 2015-08-24 DIAGNOSIS — Z79899 Other long term (current) drug therapy: Secondary | ICD-10-CM | POA: Diagnosis not present

## 2015-08-24 DIAGNOSIS — I776 Arteritis, unspecified: Secondary | ICD-10-CM | POA: Diagnosis not present

## 2015-08-24 DIAGNOSIS — R7982 Elevated C-reactive protein (CRP): Secondary | ICD-10-CM | POA: Diagnosis not present

## 2015-08-24 DIAGNOSIS — M8589 Other specified disorders of bone density and structure, multiple sites: Secondary | ICD-10-CM | POA: Diagnosis not present

## 2015-08-24 DIAGNOSIS — I10 Essential (primary) hypertension: Secondary | ICD-10-CM | POA: Diagnosis not present

## 2015-09-04 DIAGNOSIS — D045 Carcinoma in situ of skin of trunk: Secondary | ICD-10-CM | POA: Diagnosis not present

## 2015-09-04 DIAGNOSIS — D485 Neoplasm of uncertain behavior of skin: Secondary | ICD-10-CM | POA: Diagnosis not present

## 2015-09-04 DIAGNOSIS — C44722 Squamous cell carcinoma of skin of right lower limb, including hip: Secondary | ICD-10-CM | POA: Diagnosis not present

## 2015-09-04 DIAGNOSIS — C44529 Squamous cell carcinoma of skin of other part of trunk: Secondary | ICD-10-CM | POA: Diagnosis not present

## 2015-09-07 DIAGNOSIS — E039 Hypothyroidism, unspecified: Secondary | ICD-10-CM | POA: Diagnosis not present

## 2015-09-17 DIAGNOSIS — C44729 Squamous cell carcinoma of skin of left lower limb, including hip: Secondary | ICD-10-CM | POA: Diagnosis not present

## 2015-09-17 DIAGNOSIS — D485 Neoplasm of uncertain behavior of skin: Secondary | ICD-10-CM | POA: Diagnosis not present

## 2015-09-17 DIAGNOSIS — C44722 Squamous cell carcinoma of skin of right lower limb, including hip: Secondary | ICD-10-CM | POA: Diagnosis not present

## 2015-09-17 DIAGNOSIS — Z4802 Encounter for removal of sutures: Secondary | ICD-10-CM | POA: Diagnosis not present

## 2015-09-30 DIAGNOSIS — M8589 Other specified disorders of bone density and structure, multiple sites: Secondary | ICD-10-CM | POA: Diagnosis not present

## 2015-09-30 DIAGNOSIS — N183 Chronic kidney disease, stage 3 (moderate): Secondary | ICD-10-CM | POA: Diagnosis not present

## 2015-09-30 DIAGNOSIS — I776 Arteritis, unspecified: Secondary | ICD-10-CM | POA: Diagnosis not present

## 2015-09-30 DIAGNOSIS — Z79899 Other long term (current) drug therapy: Secondary | ICD-10-CM | POA: Diagnosis not present

## 2015-10-07 DIAGNOSIS — M8589 Other specified disorders of bone density and structure, multiple sites: Secondary | ICD-10-CM | POA: Diagnosis not present

## 2015-10-07 DIAGNOSIS — I776 Arteritis, unspecified: Secondary | ICD-10-CM | POA: Diagnosis not present

## 2015-10-07 DIAGNOSIS — Z79899 Other long term (current) drug therapy: Secondary | ICD-10-CM | POA: Diagnosis not present

## 2015-10-07 DIAGNOSIS — I10 Essential (primary) hypertension: Secondary | ICD-10-CM | POA: Diagnosis not present

## 2015-10-07 DIAGNOSIS — N189 Chronic kidney disease, unspecified: Secondary | ICD-10-CM | POA: Diagnosis not present

## 2015-10-21 ENCOUNTER — Encounter: Payer: Medicare Other | Admitting: Internal Medicine

## 2015-10-21 NOTE — Progress Notes (Signed)
Location:      Place of Service:     Provider: Jobie Popp L. Mariea Clonts, D.O., C.M.D.  Code Status:  Goals of Care:  Advanced Directives 07/28/2015  Does patient have an advance directive? No  Type of Advance Directive -  Does patient want to make changes to advanced directive? -  Copy of advanced directive(s) in chart? -  Would patient like information on creating an advanced directive? No - patient declined information  Pre-existing out of facility DNR order (yellow form or pink MOST form) -     Chief Complaint  Patient presents with  . Medical Management of Chronic Issues    HPI: Patient is a 82 y.o. male seen today for medical management of chronic diseases.     Past Medical History:  Diagnosis Date  . Bladder neck contracture   . BPH (benign prostatic hyperplasia)   . DDD (degenerative disc disease)   . Full dentures   . GERD (gastroesophageal reflux disease)   . History of prostate cancer DX  2002   S/P EXTERNAL RADIATION/ RADIOACTIVE SEED IMPLANTS  2003  . History of prostate cancer    S/P  RADIACTIVE PROSTATE SEED IMPLANTS 2003  . History of squamous cell carcinoma excision    2012--  RIGHT LOWER EXTREM  . HTN (hypertension)   . Hyperlipemia   . Hypothyroidism   . Macular degeneration   . Nocturia 08/08/2011  . OSA (obstructive sleep apnea) intolerant cpap  . Restless legs syndrome (RLS) 10/31/2011  . S/P radiation therapy ended 01-26-2012      for tonsillar cancer  . Saliva decreased   . Self-catheterizes urinary bladder    QID   AND   PRN  . Tonsillar cancer ([place]) 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 ---- dx oct 2013  ----s/p radiation/  ended 01-26-2012-- no surgical intervention---  residuals ( dry mouth, decreased saliva)   oncologist at [place]--  dr brizel--  HX OF -- NO RECURRENCE  . Urethral stricture   . Urinary retention with incomplete bladder emptying   . Vitamin B 12 deficiency 01/28/2011  . Vitamin D deficiency     Past  Surgical History:  Procedure Laterality Date  . BALLOON DILATION N/A 11/10/2014   Procedure: CYSTO BALLOON DILATION AND RETROGRADE URETHROGRAM ;  Surgeon: Bjorn Loser, MD;  [facility]  Service: Urology;  Laterality: N/A;  . CATARACT EXTRACTION W/ INTRAOCULAR LENS  IMPLANT, BILATERAL    . CYSTO/ BALLOON DILATION OF  URETHRAL STRICTURE  12-25-2010  . CYSTOSCOPY WITH URETHRAL DILATATION  05/31/2011   Procedure: CYSTOSCOPY WITH URETHRAL DILATATION;  Surgeon: Reece Packer, MD;  Location: [place];  Service: Urology;  Laterality: N/A;  BALLOON DILATION  . CYSTOSCOPY/RETROGRADE/URETEROSCOPY N/A 05/22/2012   Procedure: CYSTOSCOPY BALLOON DILATION RETROGRADE URETEROGRAM ;  Surgeon: Reece Packer, MD;  Location: [facility];  Service: Urology;  Laterality: N/A;  . CYTSO/  DILATATION URETHRAL STRICTURE/  BX PROSTATIC URETHRA/  REMOVAL FOREIGN BODIES  06-25-2010   [facility]  . INGUINAL HERNIA REPAIR Right 2000  . RADIOACTIVE SEED IMPLANTS, PROSTATE  JAN  2003  . SKIN LESION EXCISION  07/2012   MOST  right shoulder    Allergies  Allergen Reactions  . Cephalosporins Rash  . Suprax [Cefixime] Rash      Medication List       Accurate as of 10/21/15  1:33 PM. Always use your most recent med list.  ASHWAGANDHA PO Take by mouth daily.   CALCIUM PO Take 800 mg by mouth daily. TWO  400 MG TAB   COLLAGEN PO Take 600 mg by mouth 2 (two) times daily. Take 600 mg twice daily.   COLOSTRUM PO Take by mouth. One teaspoon daily 1 oz of water   CoQ10 100 MG Caps Take 100 mg by mouth daily. Take one daily   cyanocobalamin 100 MCG tablet Take 100 mcg by mouth daily.   DIGESTIVE ENZYMES PO Take 500 mg by mouth every morning.   fish oil-omega-3 fatty acids 1000 MG capsule Take 2 g by mouth daily.   GLUCOSAMINE-CHONDROITIN-MSM PO Take 500 mg by mouth 2 (two) times daily. Take 500 mg twice daily.   HYALURONIC ACID  PO Take by mouth. Take 100mg  twice daily for joinits   multivitamins ther. w/minerals Tabs tablet Take 2 tablets by mouth 2 (two) times daily.   NATURAL VITAMIN E 400 UNIT capsule Generic drug:  vitamin E Take 400 Units by mouth daily.   predniSONE 10 MG tablet Commonly known as:  DELTASONE Take 10 mg by mouth daily with breakfast. Take along with 20 mg   predniSONE 20 MG tablet Commonly known as:  DELTASONE Take 20 mg by mouth daily with breakfast. Take along with 10mg    RESVERATROL PO Take 100 mg by mouth 2 (two) times daily. Take 2 100 mg tablets twice daily.   Thyroid 113.75 MG Tabs Commonly known as:  NATURE-THROID Take 1 tablet by mouth daily before breakfast. At least 30 mins before breakfast or any other medications   TURMERIC CURCUMIN PO Take 650 mg by mouth every morning.   vitamin C 1000 MG tablet Take 1,000 mg by mouth daily.   Vitamin D3 5000 units Caps Take by mouth.   VITAMIN K2 PO Take by mouth daily.       Review of Systems:  ROS  Health Maintenance  Topic Date Due  . ZOSTAVAX  02/25/1994  . TETANUS/TDAP  01/17/2013  . INFLUENZA VACCINE  08/18/2015  . PNA vac Low Risk Adult  Completed    Physical Exam: There were no vitals filed for this visit. There is no height or weight on file to calculate BMI. Physical Exam  Labs reviewed: Basic Metabolic Panel:  Recent Labs  11/10/14 1115 12/18/14 12/30/14 05/12/15  NA 140 139  --   --   K 4.2 4.6  --   --   GLUCOSE 95  --   --   --   BUN  --  27*  --   --   CREATININE  --  1.6*  --   --   TSH  --  0.06* 0.04* 0.57   Liver Function Tests:  Recent Labs  12/18/14  AST 15  ALT 13  ALKPHOS 71   No results for input(s): LIPASE, AMYLASE in the last 8760 hours. No results for input(s): AMMONIA in the last 8760 hours. CBC:  Recent Labs  11/10/14 1115 12/18/14  WBC  --  8.5  HGB 13.9 13.7  HCT 41.0 41  PLT  --  262   Lipid Panel:  Recent Labs  12/18/14  CHOL 221*  HDL 59   LDLCALC 148  TRIG 97   No results found for: HGBA1C  Procedures since last visit: No results found.  Assessment/Plan There are no diagnoses linked to this encounter.   Labs/tests ordered:  @ORDERS @ Next appt:  11/18/2015  Darlisha Kelm L. Kristianne Albin, D.O. [facility] [address], [place] [address] Cell Phone (Mon-Fri 8am-5pm):  [phone] On Call:  [phone] & follow prompts after 5pm & weekends Office Phone:  [phone] Office Fax:  [phone]     This encounter was created in error - please disregard.

## 2015-10-22 DIAGNOSIS — C44722 Squamous cell carcinoma of skin of right lower limb, including hip: Secondary | ICD-10-CM | POA: Diagnosis not present

## 2015-10-29 DIAGNOSIS — D3132 Benign neoplasm of left choroid: Secondary | ICD-10-CM | POA: Diagnosis not present

## 2015-10-29 DIAGNOSIS — H353122 Nonexudative age-related macular degeneration, left eye, intermediate dry stage: Secondary | ICD-10-CM | POA: Diagnosis not present

## 2015-10-29 DIAGNOSIS — H353112 Nonexudative age-related macular degeneration, right eye, intermediate dry stage: Secondary | ICD-10-CM | POA: Diagnosis not present

## 2015-10-29 DIAGNOSIS — H5211 Myopia, right eye: Secondary | ICD-10-CM | POA: Diagnosis not present

## 2015-11-18 ENCOUNTER — Encounter: Payer: Self-pay | Admitting: Internal Medicine

## 2015-12-02 ENCOUNTER — Encounter: Payer: Self-pay | Admitting: Internal Medicine

## 2015-12-02 ENCOUNTER — Non-Acute Institutional Stay: Payer: Medicare Other | Admitting: Internal Medicine

## 2015-12-02 VITALS — BP 118/68 | HR 65 | Temp 97.9°F | Ht 72.0 in | Wt 166.0 lb

## 2015-12-02 DIAGNOSIS — J069 Acute upper respiratory infection, unspecified: Secondary | ICD-10-CM

## 2015-12-02 DIAGNOSIS — M8589 Other specified disorders of bone density and structure, multiple sites: Secondary | ICD-10-CM | POA: Diagnosis not present

## 2015-12-02 DIAGNOSIS — E034 Atrophy of thyroid (acquired): Secondary | ICD-10-CM

## 2015-12-02 DIAGNOSIS — I701 Atherosclerosis of renal artery: Secondary | ICD-10-CM

## 2015-12-02 DIAGNOSIS — M316 Other giant cell arteritis: Secondary | ICD-10-CM | POA: Diagnosis not present

## 2015-12-02 DIAGNOSIS — N189 Chronic kidney disease, unspecified: Secondary | ICD-10-CM | POA: Diagnosis not present

## 2015-12-02 DIAGNOSIS — R2689 Other abnormalities of gait and mobility: Secondary | ICD-10-CM

## 2015-12-02 DIAGNOSIS — G4733 Obstructive sleep apnea (adult) (pediatric): Secondary | ICD-10-CM

## 2015-12-02 DIAGNOSIS — I10 Essential (primary) hypertension: Secondary | ICD-10-CM

## 2015-12-02 DIAGNOSIS — B9789 Other viral agents as the cause of diseases classified elsewhere: Secondary | ICD-10-CM | POA: Diagnosis not present

## 2015-12-02 DIAGNOSIS — H903 Sensorineural hearing loss, bilateral: Secondary | ICD-10-CM | POA: Diagnosis not present

## 2015-12-02 DIAGNOSIS — I776 Arteritis, unspecified: Secondary | ICD-10-CM | POA: Diagnosis not present

## 2015-12-02 DIAGNOSIS — Z79899 Other long term (current) drug therapy: Secondary | ICD-10-CM | POA: Diagnosis not present

## 2015-12-02 NOTE — Progress Notes (Signed)
Location:  [facility] of Service:  Clinic (12)  Provider: Danna Casella L. Mariea Clonts, D.O., C.M.D.  Code Status: DNR Goals of Care:  Advanced Directives 12/02/2015  Does patient have an advance directive? Yes  Type of Paramedic of [place];Out of facility DNR (pink MOST or yellow form)  Does patient want to make changes to advanced directive? -  Copy of advanced directive(s) in chart? Yes  Would patient like information on creating an advanced directive? -  Pre-existing out of facility DNR order (yellow form or pink MOST form) Yellow form placed in chart (order not valid for inpatient use)   Chief Complaint  Patient presents with  . Medical Management of Chronic Issues    3 mth follow-up    HPI: Patient is a 82 y.o. male seen today for medical management of chronic diseases.    Has a cold and cough.  Has had this for 4-5 days.  No fever or chills.  No known sick contacts.  Nonproductive cough.  Coughing is worse at night.  Occasionally coughs for 10-15 mins in daytime.  Has stuffy nose.  Has taken delsym cough syrup--he's not sure if it helped or not.  May be helping slightly.  No changes in appetite--much better since being on prednisone.   After his throat cancer, he only ate half a plate and now he cleans it again.  Has a sleep study pending at [facility] neurologic on Sunday.  Says slight depression, poor memory and lack of energy due to poor sleeping.    He is down to 5mg  daily on his prednisone for about a month now.  Sees Dr. Charlestine Night next week.  Fatigue is legs is much better.  Still can't walk like he used to as far as pace.  Still not back to where he was on table tennis, but that was 3 years ago.    He's been dizzy when he gets up first thing in the morning.  He was playing in the finals for his table tennis.  He got dizzy while playing the first place player and he beat him.  Says it started before he stood up.  Says he staggers  and is unsteady on his feet.  Not really lightheaded or spinning but more like staggering on a ship.  Does ok if he's consistent with his energy levels, but if he cranks it up, he gets fatigued easily.  He's less aware of his diplopia now.  Only if he looks out of the corner of his eye, he sees double  Has trouble focusing to keep up the speed of the ball.    BP great here, but elevated at home.  Has not been taking the lisinopril for some time now.  Past Medical History:  Diagnosis Date  . Bladder neck contracture   . BPH (benign prostatic hyperplasia)   . DDD (degenerative disc disease)   . Full dentures   . GERD (gastroesophageal reflux disease)   . History of prostate cancer DX  2002   S/P EXTERNAL RADIATION/ RADIOACTIVE SEED IMPLANTS  2003  . History of prostate cancer    S/P  RADIACTIVE PROSTATE SEED IMPLANTS 2003  . History of squamous cell carcinoma excision    2012--  RIGHT LOWER EXTREM  . HTN (hypertension)   . Hyperlipemia   . Hypothyroidism   . Macular degeneration   . Nocturia 08/08/2011  . OSA (obstructive sleep apnea) intolerant cpap  . Restless legs syndrome (RLS)  10/31/2011  . S/P radiation therapy ended 01-26-2012      for tonsillar cancer  . Saliva decreased   . Self-catheterizes urinary bladder    QID   AND   PRN  . Tonsillar cancer ([place]) 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 ---- dx oct 2013  ----s/p radiation/  ended 01-26-2012-- no surgical intervention---  residuals ( dry mouth, decreased saliva)   oncologist at [place]--  dr brizel--  HX OF -- NO RECURRENCE  . Urethral stricture   . Urinary retention with incomplete bladder emptying   . Vitamin B 12 deficiency 01/28/2011  . Vitamin D deficiency     Past Surgical History:  Procedure Laterality Date  . BALLOON DILATION N/A 11/10/2014   Procedure: CYSTO BALLOON DILATION AND RETROGRADE URETHROGRAM ;  Surgeon: Bjorn Loser, MD;  Location: [facility];  Service: Urology;   Laterality: N/A;  . CATARACT EXTRACTION W/ INTRAOCULAR LENS  IMPLANT, BILATERAL    . CYSTO/ BALLOON DILATION OF  URETHRAL STRICTURE  12-25-2010  . CYSTOSCOPY WITH URETHRAL DILATATION  05/31/2011   Procedure: CYSTOSCOPY WITH URETHRAL DILATATION;  Surgeon: Reece Packer, MD;  Location: [place];  Service: Urology;  Laterality: N/A;  BALLOON DILATION  . CYSTOSCOPY/RETROGRADE/URETEROSCOPY N/A 05/22/2012   Procedure: CYSTOSCOPY BALLOON DILATION RETROGRADE URETEROGRAM ;  Surgeon: Reece Packer, MD;  Location: [facility];  Service: Urology;  Laterality: N/A;  . CYTSO/  DILATATION URETHRAL STRICTURE/  BX PROSTATIC URETHRA/  REMOVAL FOREIGN BODIES  06-25-2010   [facility]  . INGUINAL HERNIA REPAIR Right 2000  . RADIOACTIVE SEED IMPLANTS, PROSTATE  JAN  2003  . SKIN LESION EXCISION  07/2012   MOST  right shoulder    Allergies  Allergen Reactions  . Cephalosporins Rash  . Suprax [Cefixime] Rash      Medication List       Accurate as of 12/02/15  9:40 AM. Always use your most recent med list.          ASHWAGANDHA PO Take by mouth daily.   CALCIUM PO Take 800 mg by mouth daily. TWO  400 MG TAB   Cholecalciferol 4000 units Caps Take by mouth daily.   COLLAGEN PO Take 600 mg by mouth 2 (two) times daily. Take 600 mg twice daily.   COLOSTRUM PO Take by mouth. One teaspoon daily 1 oz of water   CoQ10 100 MG Caps Take 100 mg by mouth daily. Take one daily   cyanocobalamin 100 MCG tablet Take 100 mcg by mouth daily.   DIGESTIVE ENZYMES PO Take 500 mg by mouth every morning.   fish oil-omega-3 fatty acids 1000 MG capsule Take 2 g by mouth daily.   GLUCOSAMINE-CHONDROITIN-MSM PO Take 500 mg by mouth 2 (two) times daily. Take 500 mg twice daily.   HYALURONIC ACID PO Take by mouth. Take 100mg  twice daily for joinits   multivitamins ther. w/minerals Tabs tablet Take 2 tablets by mouth 2 (two) times daily.   NATURAL VITAMIN E 400 UNIT  capsule Generic drug:  vitamin E Take 400 Units by mouth daily.   NP THYROID 90 MG tablet Generic drug:  thyroid Take 90 mg by mouth daily. Take with the 23mcg tab   NP THYROID 15 MG tablet Generic drug:  thyroid Take 15 mg by mouth daily. Take with 16mcg tab   RESVERATROL PO Take 100 mg by mouth 2 (two) times daily. Take 2 100 mg tablets twice daily.   TURMERIC CURCUMIN  PO Take 650 mg by mouth every morning.   vitamin C 1000 MG tablet Take 1,000 mg by mouth daily.   VITAMIN K2 PO Take by mouth daily.       Review of Systems:  Review of Systems  Constitutional: Positive for malaise/fatigue. Negative for chills, fever and weight loss.  HENT: Positive for congestion and hearing loss. Negative for sinus pain and sore throat.   Eyes: Positive for double vision.  Respiratory: Positive for cough. Negative for hemoptysis, sputum production, shortness of breath, wheezing and stridor.   Cardiovascular: Negative for chest pain, palpitations, claudication and leg swelling.  Gastrointestinal: Negative for abdominal pain, blood in stool, constipation and melena.  Genitourinary: Negative for dysuria.  Musculoskeletal: Negative for falls and myalgias.  Skin: Negative for itching and rash.  Neurological: Positive for dizziness. Negative for loss of consciousness and weakness.       Seems like a cerebellar vs. Due to vascullitis problem  Endo/Heme/Allergies: Bruises/bleeds easily.  Psychiatric/Behavioral: Positive for depression and memory loss. The patient has insomnia.     Health Maintenance  Topic Date Due  . ZOSTAVAX  02/25/1994  . TETANUS/TDAP  01/17/2013  . INFLUENZA VACCINE  08/18/2015  . PNA vac Low Risk Adult  Completed    Physical Exam: Vitals:   12/02/15 0927  BP: 118/68  Pulse: 65  Temp: 97.9 F (36.6 C)  TempSrc: Oral  SpO2: 98%  Weight: 166 lb (75.3 kg)  Height: 6' (1.829 m)   Body mass index is 22.51 kg/m. Physical Exam  Constitutional: He is oriented  to person, place, and time. He appears well-developed and well-nourished. No distress.  HENT:  Head: Normocephalic and atraumatic.  Right Ear: External ear normal.  Left Ear: External ear normal.  Mouth/Throat: Oropharynx is clear and moist. No oropharyngeal exudate.  Nasal congestion; small amt of cerumen in bilateral canals  Eyes: Conjunctivae and EOM are normal. Pupils are equal, round, and reactive to light.  Neck: Neck supple. No JVD present. No thyromegaly present.  Cardiovascular: Normal rate, regular rhythm, normal heart sounds and intact distal pulses.   Pulmonary/Chest: Effort normal and breath sounds normal.  Abdominal: Bowel sounds are normal.  Musculoskeletal: Normal range of motion.  Lymphadenopathy:    He has cervical adenopathy.  Neurological: He is alert and oriented to person, place, and time.  Not dizzy at present  Skin: Skin is warm and dry.    Labs reviewed: Basic Metabolic Panel:  Recent Labs  12/18/14 12/30/14 05/12/15  NA 139  --   --   K 4.6  --   --   BUN 27*  --   --   CREATININE 1.6*  --   --   TSH 0.06* 0.04* 0.57   Liver Function Tests:  Recent Labs  12/18/14  AST 15  ALT 13  ALKPHOS 71   No results for input(s): LIPASE, AMYLASE in the last 8760 hours. No results for input(s): AMMONIA in the last 8760 hours. CBC:  Recent Labs  12/18/14  WBC 8.5  HGB 13.7  HCT 41  PLT 262   Lipid Panel:  Recent Labs  12/18/14  CHOL 221*  HDL 59  LDLCALC 148  TRIG 97   Notes reviewed from specialists since last visit.  Assessment/Plan 1. 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 OSA (obstructive sleep apnea) -is for split night sleep  study Sunday--pt wanting to put off due to his cold and "fall back" but encouraged him to follow through on the test so we can prove what is going on with his sleep  patterns and fatigue  3. Essential hypertension -bp at goal today here, but elevated at home with home cuff and during rheum appts where he's been encouraged to take his lisinopril as ordered, but he is is not -recommended he bring his bp cuff from home to his next appt in 4 mos to calibrate it  4. Hypothyroidism due to acquired atrophy of thyroid -sees endocrinology for this due to taking an unusual variant of thyroid medication and asking every specialist and myself to monitor his levels--decided to have endocrine manage.  5. Viral URI with cough -discussed hydration, rest with pt, he also wants to take more vitamin C  -good handwashing, may cont delsym, but should use mucinex if congestion moves more into his chest -call back if fever develops  6. Balance disorder -having what sound like it's more cerebellar/central vertigo where he is stumbling at times and feels dizzy (not lightheaded or spinning) -suspect it is related to the vasculitis and currently worse with his cold  7.  sensorineural hearing loss bilateral:  We also discussed seeing audiology for further eval of his hearing loss; he reports the hearing aides only help the volume but not the clarity which is not unusual.  He is not wearing them.  There was no significant cerumen.  Labs/tests ordered:  No new, sees specialists for most labs outside of the system   Next appt:  4 mos for med mgt  Avianna Moynahan L. Aleem Elza, D.O. [facility] [address],  [address] Cell Phone (Mon-Fri 8am-5pm):  [phone] On Call:  [phone] & follow prompts after 5pm & weekends Office Phone:  [phone] Office Fax:  [phone]

## 2015-12-09 DIAGNOSIS — J069 Acute upper respiratory infection, unspecified: Secondary | ICD-10-CM | POA: Diagnosis not present

## 2015-12-09 DIAGNOSIS — I10 Essential (primary) hypertension: Secondary | ICD-10-CM | POA: Diagnosis not present

## 2015-12-09 DIAGNOSIS — I776 Arteritis, unspecified: Secondary | ICD-10-CM | POA: Diagnosis not present

## 2015-12-09 DIAGNOSIS — R05 Cough: Secondary | ICD-10-CM | POA: Diagnosis not present

## 2015-12-17 DIAGNOSIS — C44729 Squamous cell carcinoma of skin of left lower limb, including hip: Secondary | ICD-10-CM | POA: Diagnosis not present

## 2015-12-22 DIAGNOSIS — Z9221 Personal history of antineoplastic chemotherapy: Secondary | ICD-10-CM | POA: Diagnosis not present

## 2015-12-22 DIAGNOSIS — Z08 Encounter for follow-up examination after completed treatment for malignant neoplasm: Secondary | ICD-10-CM | POA: Diagnosis not present

## 2015-12-22 DIAGNOSIS — I6522 Occlusion and stenosis of left carotid artery: Secondary | ICD-10-CM | POA: Diagnosis not present

## 2015-12-22 DIAGNOSIS — R252 Cramp and spasm: Secondary | ICD-10-CM | POA: Diagnosis not present

## 2015-12-22 DIAGNOSIS — Z85818 Personal history of malignant neoplasm of other sites of lip, oral cavity, and pharynx: Secondary | ICD-10-CM | POA: Diagnosis not present

## 2015-12-22 DIAGNOSIS — R432 Parageusia: Secondary | ICD-10-CM | POA: Diagnosis not present

## 2015-12-22 DIAGNOSIS — E039 Hypothyroidism, unspecified: Secondary | ICD-10-CM | POA: Diagnosis not present

## 2015-12-22 DIAGNOSIS — Z923 Personal history of irradiation: Secondary | ICD-10-CM | POA: Diagnosis not present

## 2015-12-22 DIAGNOSIS — K117 Disturbances of salivary secretion: Secondary | ICD-10-CM | POA: Diagnosis not present

## 2016-01-06 DIAGNOSIS — E039 Hypothyroidism, unspecified: Secondary | ICD-10-CM | POA: Diagnosis not present

## 2016-02-09 ENCOUNTER — Telehealth: Payer: Self-pay | Admitting: *Deleted

## 2016-02-09 NOTE — Telephone Encounter (Signed)
Patient wife, Angelita Ingles called and stated that she would like for patient to be seen at [facility] because he is losing weight and every time he eats he is having pain in stomach. Would like to see you tomorrow but no available appointment. Please Advise.

## 2016-02-09 NOTE — Telephone Encounter (Signed)
Spoke with patient and scheduled appt at the office on Thursday 02/10/17

## 2016-02-10 DIAGNOSIS — I776 Arteritis, unspecified: Secondary | ICD-10-CM | POA: Diagnosis not present

## 2016-02-10 DIAGNOSIS — I10 Essential (primary) hypertension: Secondary | ICD-10-CM | POA: Diagnosis not present

## 2016-02-10 DIAGNOSIS — J069 Acute upper respiratory infection, unspecified: Secondary | ICD-10-CM | POA: Diagnosis not present

## 2016-02-10 DIAGNOSIS — R05 Cough: Secondary | ICD-10-CM | POA: Diagnosis not present

## 2016-02-10 DIAGNOSIS — N189 Chronic kidney disease, unspecified: Secondary | ICD-10-CM | POA: Diagnosis not present

## 2016-02-11 ENCOUNTER — Encounter: Payer: Self-pay | Admitting: Internal Medicine

## 2016-02-11 ENCOUNTER — Ambulatory Visit (INDEPENDENT_AMBULATORY_CARE_PROVIDER_SITE_OTHER): Payer: Medicare Other | Admitting: Internal Medicine

## 2016-02-11 VITALS — BP 130/70 | HR 68 | Temp 97.4°F | Wt 157.0 lb

## 2016-02-11 DIAGNOSIS — L740 Miliaria rubra: Secondary | ICD-10-CM

## 2016-02-11 DIAGNOSIS — R110 Nausea: Secondary | ICD-10-CM

## 2016-02-11 DIAGNOSIS — M316 Other giant cell arteritis: Secondary | ICD-10-CM

## 2016-02-11 DIAGNOSIS — T50905A Adverse effect of unspecified drugs, medicaments and biological substances, initial encounter: Secondary | ICD-10-CM

## 2016-02-11 NOTE — Progress Notes (Signed)
Location:  [facility] clinic Provider: Esco Joslyn L. Mariea Clonts, D.O., C.M.D.  Code Status: DNR Goals of Care:  Advanced Directives 12/02/2015  Does Patient Have a Medical Advance Directive? Yes  Type of Paramedic of [place];Out of facility DNR (pink MOST or yellow form)  Does patient want to make changes to medical advance directive? -  Copy of Philip [name] in Chart? Yes  Would patient like information on creating a medical advance directive? -  Pre-existing out of facility DNR order (yellow form or pink MOST form) Yellow form placed in chart (order not valid for inpatient use)     Chief Complaint  Patient presents with  . Acute Visit    losing weight    HPI: Patient is a 83 y.o. male seen today for an acute visit for weight loss, abdominal pain after meals and poor po intake.    Philip [name] and follows with Dr. Charlestine Night for it.  He has been off prednisone therapy since at least October (tapered off).    He had a cough, took delsym, graduated to mucinex.  At some point, he got an ache in his mid abdomen, a mild queasiness that put him off eating for a while.  It has gotten better, but he's still not eating a full amount.  He took a little colloidal silver one night which he thinks made him not cough as much, but the second day, it returned.  He then took it two night, he was better, then it came back.    He's not sure if it caused the queasiness.  He's not sure if the queasiness came before or after the colloidal silver.  His wife reports his energy level being very low and not being back to where it was.  It felt different than the [name] which went hip to hip.  This was just in his stomach as an ache and queasiness.  Did not ever vomit or have diarrhea.  Some queasiness after eating a bowl of chicken soup today.  His wife reports also that historically he could eat despite having some nausea in the past.  He is down 9 lbs since November by  our record, but it's only short term.  He was able to eat breakfast and lunch.   I asked if his memory is declining and his wife gave a thumbs up.  He says he does not pay attention to things.  He will say that his brain does not work like it used to.  He'll have difficulty finding words at time.    Past Medical History:  Diagnosis Date  . Bladder neck contracture   . BPH (benign prostatic hyperplasia)   . DDD (degenerative disc disease)   . Full dentures   . GERD (gastroesophageal reflux disease)   . History of prostate cancer DX  2002   S/P EXTERNAL RADIATION/ RADIOACTIVE SEED IMPLANTS  2003  . History of prostate cancer    S/P  RADIACTIVE PROSTATE SEED IMPLANTS 2003  . History of squamous cell carcinoma excision    2012--  RIGHT LOWER EXTREM  . HTN (hypertension)   . Hyperlipemia   . Hypothyroidism   . Macular degeneration   . Nocturia 08/08/2011  . OSA (obstructive sleep apnea) intolerant cpap  . Restless legs syndrome (RLS) 10/31/2011  . S/P radiation therapy ended 01-26-2012      for tonsillar cancer  . Saliva decreased   . Self-catheterizes urinary bladder  QID   AND   PRN  . Tonsillar cancer ([place]) 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 ---- dx oct 2013  ----s/p radiation/  ended 01-26-2012-- no surgical intervention---  residuals ( dry mouth, decreased saliva)   oncologist at [facility]--  dr brizel--  HX OF -- NO RECURRENCE  . Urethral stricture   . Urinary retention with incomplete bladder emptying   . Vitamin B 12 deficiency 01/28/2011  . Vitamin D deficiency     Past Surgical History:  Procedure Laterality Date  . BALLOON DILATION N/A 11/10/2014   Procedure: CYSTO BALLOON DILATION AND RETROGRADE URETHROGRAM ;  Surgeon: Bjorn Loser, MD;  Location: [facility];  Service: Urology;  Laterality: N/A;  . CATARACT EXTRACTION W/ INTRAOCULAR LENS  IMPLANT, BILATERAL    . CYSTO/ BALLOON DILATION OF  URETHRAL STRICTURE  12-25-2010  .  CYSTOSCOPY WITH URETHRAL DILATATION  05/31/2011   Procedure: CYSTOSCOPY WITH URETHRAL DILATATION;  Surgeon: Reece Packer, MD;  Location: [place];  Service: Urology;  Laterality: N/A;  BALLOON DILATION  . CYSTOSCOPY/RETROGRADE/URETEROSCOPY N/A 05/22/2012   Procedure: CYSTOSCOPY BALLOON DILATION RETROGRADE URETEROGRAM ;  Surgeon: Reece Packer, MD;  Location: [facility];  Service: Urology;  Laterality: N/A;  . CYTSO/  DILATATION URETHRAL STRICTURE/  BX PROSTATIC URETHRA/  REMOVAL FOREIGN BODIES  06-25-2010   [facility]  . INGUINAL HERNIA REPAIR Right 2000  . RADIOACTIVE SEED IMPLANTS, PROSTATE  JAN  2003  . SKIN LESION EXCISION  07/2012   MOST  right shoulder    Allergies  Allergen Reactions  . Cephalosporins Rash  . Suprax [Cefixime] Rash    Allergies as of 02/11/2016      Reactions   Cephalosporins Rash   Suprax [cefixime] Rash      Medication List       Accurate as of 02/11/16  2:42 PM. Always use your most recent med list.          ASHWAGANDHA PO Take by mouth daily.   CALCIUM PO Take 800 mg by mouth daily. TWO  400 MG TAB   Cholecalciferol 4000 units Caps Take by mouth daily.   COLLAGEN PO Take 600 mg by mouth 2 (two) times daily. Take 600 mg twice daily.   CoQ10 100 MG Caps Take 100 mg by mouth daily. Take one daily   cyanocobalamin 100 MCG tablet Take 100 mcg by mouth daily.   DIGESTIVE ENZYMES PO Take 500 mg by mouth every morning.   fish oil-omega-3 fatty acids 1000 MG capsule Take 2 g by mouth daily.   GLUCOSAMINE-CHONDROITIN-MSM PO Take 500 mg by mouth 2 (two) times daily. Take 500 mg twice daily.   HYALURONIC ACID PO Take by mouth. Take 100mg  twice daily for joinits   multivitamins ther. w/minerals Tabs tablet Take 2 tablets by mouth 2 (two) times daily.   NATURAL VITAMIN E 400 UNIT capsule Generic drug:  vitamin E Take 400 Units by mouth daily.   NP THYROID 90 MG tablet Generic drug:  thyroid Take 90 mg  by mouth daily. Take with the 74mcg tab   NP THYROID 15 MG tablet Generic drug:  thyroid Take 15 mg by mouth daily. Take with 9mcg tab   RESVERATROL PO Take 100 mg by mouth 2 (two) times daily. Take 2 100 mg tablets twice daily.   TURMERIC CURCUMIN PO Take 650 mg by mouth every morning.   vitamin C 1000 MG tablet Take 1,000 mg by mouth daily.  VITAMIN K2 PO Take by mouth daily.       Review of Systems:  Review of Systems  Constitutional: Positive for malaise/fatigue and weight loss. Negative for chills and fever.       Improving  Eyes: Negative for blurred vision.  Respiratory: Negative for cough and shortness of breath.   Cardiovascular: Negative for chest pain and palpitations.  Gastrointestinal: Positive for abdominal pain and nausea. Negative for blood in stool, constipation, diarrhea, heartburn, melena and vomiting.  Genitourinary: Negative for dysuria, frequency and urgency.  Musculoskeletal: Negative for falls and myalgias.  Neurological: Positive for weakness.    Health Maintenance  Topic Date Due  . ZOSTAVAX  02/25/1994  . TETANUS/TDAP  01/17/2013  . INFLUENZA VACCINE  04/16/2016 (Originally 08/18/2015)  . PNA vac Low Risk Adult  Completed    Physical Exam: Vitals:   02/11/16 1437  BP: 130/70  Pulse: 68  Temp: 97.4 F (36.3 C)  TempSrc: Oral  SpO2: 97%  Weight: 157 lb (71.2 kg)   Body mass index is 21.29 kg/m. Physical Exam  Constitutional: He is oriented to person, place, and time. He appears well-developed and well-nourished. No distress.  Cardiovascular: Normal rate, regular rhythm, normal heart sounds and intact distal pulses.   Pulmonary/Chest: Effort normal and breath sounds normal. No respiratory distress.  Abdominal: Soft. Bowel sounds are normal. He exhibits no distension and no mass. There is no tenderness. There is no rebound and no guarding. No hernia.  Musculoskeletal: Normal range of motion.  Neurological: He is alert and oriented to  person, place, and time.  Slower to give history than he had been  Skin: Skin is warm and dry. Rash noted.  On his entire back, raised papular  Psychiatric: He has a normal mood and affect.    Labs reviewed: Basic Metabolic Panel:  Recent Labs  05/12/15  TSH 0.57    Assessment/Plan 1. 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 Abdominal ache (seems he's not sure which) but resulted in poor po intake, weight loss  2. Adverse drug interaction with herbal supplement -suspect colloidal silver intake x 3 may have caused his nausea/GI upset/abdominal aching  3. Nausea without vomiting -seems unlikely to be an ulcer considering off steroids for several months now, also not like his [name] before -is better today so will not workup unless recurs  4.  Heat rash -seems he has a contact/heat rash on his back from lying on the sofa all day yesterday -advised to tell Dr. Charlestine Night about it though also -had labs and will see Dr. Charlestine Night coming up  Labs/tests ordered:  No orders of the defined types were placed in this encounter.  Next appt:  4 mos and prn  Mckala Pantaleon L. Rhianon Zabawa, D.O. [facility] [address], [place] [address] Cell Phone (Mon-Fri 8am-5pm):  [phone] On Call:  [phone] & follow prompts after 5pm & weekends Office Phone:  [phone] Office Fax:  [phone]

## 2016-02-12 LAB — CBC AND DIFFERENTIAL
HCT: 42 % (ref 41–53)
HEMOGLOBIN: 14.9 g/dL (ref 13.5–17.5)
PLATELETS: 182 10*3/uL (ref 150–399)
WBC: 4.9 10*3/mL

## 2016-02-12 LAB — HEPATIC FUNCTION PANEL
ALT: 17 U/L (ref 10–40)
AST: 20 U/L (ref 14–40)
Alkaline Phosphatase: 67 U/L (ref 25–125)
Bilirubin, Total: 0.3 mg/dL

## 2016-02-12 LAB — BASIC METABOLIC PANEL
BUN: 23 mg/dL — AB (ref 4–21)
Creatinine: 1.6 mg/dL — AB (ref 0.6–1.3)
Glucose: 100 mg/dL
Potassium: 4.3 mmol/L (ref 3.4–5.3)
Sodium: 143 mmol/L (ref 137–147)

## 2016-02-18 ENCOUNTER — Other Ambulatory Visit: Payer: Self-pay | Admitting: Internal Medicine

## 2016-02-18 DIAGNOSIS — R110 Nausea: Secondary | ICD-10-CM | POA: Diagnosis not present

## 2016-02-18 DIAGNOSIS — I776 Arteritis, unspecified: Secondary | ICD-10-CM | POA: Diagnosis not present

## 2016-02-18 DIAGNOSIS — L309 Dermatitis, unspecified: Secondary | ICD-10-CM | POA: Diagnosis not present

## 2016-02-18 DIAGNOSIS — N183 Chronic kidney disease, stage 3 (moderate): Secondary | ICD-10-CM | POA: Diagnosis not present

## 2016-02-18 DIAGNOSIS — R319 Hematuria, unspecified: Secondary | ICD-10-CM | POA: Diagnosis not present

## 2016-02-18 NOTE — Telephone Encounter (Signed)
Spoke with patient and advised results rx sent to pharmacy by e-script  

## 2016-02-19 DIAGNOSIS — L821 Other seborrheic keratosis: Secondary | ICD-10-CM | POA: Diagnosis not present

## 2016-02-19 DIAGNOSIS — L111 Transient acantholytic dermatosis [Grover]: Secondary | ICD-10-CM | POA: Diagnosis not present

## 2016-02-19 DIAGNOSIS — Z23 Encounter for immunization: Secondary | ICD-10-CM | POA: Diagnosis not present

## 2016-02-25 ENCOUNTER — Other Ambulatory Visit: Payer: Self-pay | Admitting: Internal Medicine

## 2016-02-25 MED ORDER — ALENDRONATE SODIUM 70 MG PO TABS: 70.0000 mg | ORAL_TABLET | ORAL | 11 refills | Status: DC

## 2016-02-25 MED ORDER — PREDNISONE 20 MG PO TABS: ORAL_TABLET | ORAL | 0 refills | Status: DC

## 2016-02-25 NOTE — Progress Notes (Signed)
Received note from Dr. Charlestine Night about pt's latest visit with abdominal pain and nausea--he was started on prednisone 60mg  daily for one week from 02/18/16, then 20mg  po bid thereafter until the next treatment can be determined.  He was not taking his lisinopril regularly when seen there.  Also, he was advised to take alendronate weekly for bone protection due to steroid therapy.  Meds added to list.  Chantille Navarrete L. Amarise Lillo, D.O. [facility] [address], [place] [address] Cell Phone (Mon-Fri 8am-5pm):  [phone] On Call:  [phone] & follow prompts after 5pm & weekends Office Phone:  [phone] Office Fax:  [phone]

## 2016-03-02 DIAGNOSIS — N183 Chronic kidney disease, stage 3 (moderate): Secondary | ICD-10-CM | POA: Diagnosis not present

## 2016-03-02 DIAGNOSIS — I776 Arteritis, unspecified: Secondary | ICD-10-CM | POA: Diagnosis not present

## 2016-03-02 DIAGNOSIS — L309 Dermatitis, unspecified: Secondary | ICD-10-CM | POA: Diagnosis not present

## 2016-03-03 ENCOUNTER — Encounter (HOSPITAL_COMMUNITY): Payer: Self-pay

## 2016-03-03 ENCOUNTER — Emergency Department (HOSPITAL_COMMUNITY)
Admission: EM | Admit: 2016-03-03 | Discharge: 2016-03-03 | Disposition: A | Discharge status: Home / Self Care | Payer: Medicare Other | Attending: Emergency Medicine | Admitting: Emergency Medicine

## 2016-03-03 ENCOUNTER — Ambulatory Visit (HOSPITAL_COMMUNITY)
Admission: EM | Admit: 2016-03-03 | Discharge: 2016-03-03 | Disposition: A | Discharge status: Home / Self Care | Payer: Medicare Other

## 2016-03-03 ENCOUNTER — Emergency Department (HOSPITAL_COMMUNITY): Payer: Medicare Other

## 2016-03-03 DIAGNOSIS — Y929 Unspecified place or not applicable: Secondary | ICD-10-CM | POA: Diagnosis not present

## 2016-03-03 DIAGNOSIS — S43034A Inferior dislocation of right humerus, initial encounter: Secondary | ICD-10-CM | POA: Diagnosis not present

## 2016-03-03 DIAGNOSIS — W010XXA Fall on same level from slipping, tripping and stumbling without subsequent striking against object, initial encounter: Secondary | ICD-10-CM | POA: Diagnosis not present

## 2016-03-03 DIAGNOSIS — Y999 Unspecified external cause status: Secondary | ICD-10-CM | POA: Insufficient documentation

## 2016-03-03 DIAGNOSIS — I10 Essential (primary) hypertension: Secondary | ICD-10-CM | POA: Diagnosis not present

## 2016-03-03 DIAGNOSIS — Z79899 Other long term (current) drug therapy: Secondary | ICD-10-CM | POA: Insufficient documentation

## 2016-03-03 DIAGNOSIS — Y9301 Activity, walking, marching and hiking: Secondary | ICD-10-CM | POA: Insufficient documentation

## 2016-03-03 DIAGNOSIS — S43004A Unspecified dislocation of right shoulder joint, initial encounter: Secondary | ICD-10-CM | POA: Diagnosis not present

## 2016-03-03 DIAGNOSIS — E039 Hypothyroidism, unspecified: Secondary | ICD-10-CM | POA: Insufficient documentation

## 2016-03-03 DIAGNOSIS — Z85818 Personal history of malignant neoplasm of other sites of lip, oral cavity, and pharynx: Secondary | ICD-10-CM | POA: Diagnosis not present

## 2016-03-03 DIAGNOSIS — S4991XA Unspecified injury of right shoulder and upper arm, initial encounter: Secondary | ICD-10-CM | POA: Diagnosis present

## 2016-03-03 MED ORDER — OXYCODONE-ACETAMINOPHEN 5-325 MG PO TABS
1.0000 | ORAL_TABLET | Freq: Once | ORAL | Status: AC
  Administered 2016-03-03: 1 via ORAL

## 2016-03-03 MED ORDER — ONDANSETRON HCL 4 MG/2ML IJ SOLN
4.0000 mg | Freq: Once | INTRAMUSCULAR | Status: DC
  Filled 2016-03-03: qty 2

## 2016-03-03 MED ORDER — LIDOCAINE HCL (PF) 1 % IJ SOLN
INTRAMUSCULAR | Status: AC
  Administered 2016-03-03: 30 mL
  Filled 2016-03-03: qty 30

## 2016-03-03 MED ORDER — LIDOCAINE HCL (PF) 1 % IJ SOLN
INTRAMUSCULAR | Status: AC
  Filled 2016-03-03: qty 5

## 2016-03-03 MED ORDER — FENTANYL CITRATE (PF) 100 MCG/2ML IJ SOLN
50.0000 ug | Freq: Once | INTRAMUSCULAR | Status: AC
  Administered 2016-03-03: 50 ug via INTRAVENOUS
  Filled 2016-03-03: qty 2

## 2016-03-03 MED ORDER — ONDANSETRON HCL 4 MG/2ML IJ SOLN
4.0000 mg | Freq: Once | INTRAMUSCULAR | Status: AC
  Administered 2016-03-03: 4 mg via INTRAVENOUS
  Filled 2016-03-03: qty 2

## 2016-03-03 MED ORDER — LIDOCAINE HCL (PF) 2 % IJ SOLN
10.0000 mL | Freq: Once | INTRAMUSCULAR | Status: DC
  Filled 2016-03-03: qty 10

## 2016-03-03 MED ORDER — OXYCODONE-ACETAMINOPHEN 5-325 MG PO TABS
ORAL_TABLET | ORAL | Status: AC
  Filled 2016-03-03: qty 1

## 2016-03-03 NOTE — ED Provider Notes (Signed)
[facility] DEPT Provider Note   CSN: [id number] Arrival date & time: 03/03/16  1229     History   Chief Complaint Chief Complaint  Patient presents with  . Fall    HPI Philip [name] is a 83 y.o. male.  83 year old Caucasian male with a past medical history significant for hypertension, hypothyroidism, OSA, vasculitis that presents to the ED today after sustaining a mechanical fall with right shoulder pain. Patient states that he was walking and his feet got tripped up on the concrete and he fell forward landing onto his right shoulder. He denies hitting his head. He denies LOC. He denies any other pain besides the right shoulder pain. Was seen in urgent care who sent patient to the ED. Positive deformity to the right shoulder. Patient is holding his shoulder in an adducted position. Denies any parethesias, weakness, wound, neck pain back pain, or any other associated symptoms.       Past Medical History:  Diagnosis Date  . Bladder neck contracture   . BPH (benign prostatic hyperplasia)   . DDD (degenerative disc disease)   . Full dentures   . GERD (gastroesophageal reflux disease)   . History of prostate cancer DX  2002   S/P EXTERNAL RADIATION/ RADIOACTIVE SEED IMPLANTS  2003  . History of prostate cancer    S/P  RADIACTIVE PROSTATE SEED IMPLANTS 2003  . History of squamous cell carcinoma excision    2012--  RIGHT LOWER EXTREM  . HTN (hypertension)   . Hyperlipemia   . Hypothyroidism   . Macular degeneration   . Nocturia 08/08/2011  . OSA (obstructive sleep apnea) intolerant cpap  . Restless legs syndrome (RLS) 10/31/2011  . S/P radiation therapy ended 01-26-2012      for tonsillar cancer  . Saliva decreased   . Self-catheterizes urinary bladder    QID   AND   PRN  . Tonsillar cancer ([place]) 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 ---- dx oct 2013  ----s/p radiation/  ended 01-26-2012-- no surgical intervention---  residuals ( dry mouth,  decreased saliva)   oncologist at [facility]--  dr brizel--  HX OF -- NO RECURRENCE  . Urethral stricture   . Urinary retention with incomplete bladder emptying   . Vitamin B 12 deficiency 01/28/2011  . Vitamin D deficiency     Patient Active Problem List   Diagnosis Date Noted  . Large vessel vasculitis ([place]) 06/10/2015  . Right lower quadrant abdominal pain 06/10/2015  . Personal history of malignant neoplasm of other sites of lip, oral cavity, and pharynx 07/01/2014  . H/O drug therapy 12/31/2013  . Hyperlipidemia 11/04/2013  . Delayed effect of radiation 05/06/2013  . Depression 04/01/2013  . Male sexual dysfunction 01/20/2013  . Fatigue 10/22/2012  . Hypothyroidism 07/23/2012  . History of prostate cancer   . HTN (hypertension)   . Tonsillar cancer ([place])   . Binocular vision disorder with diplopia 07/10/2012  . Encounter for antineoplastic chemotherapy 12/21/2011  . Malignant neoplasm of tonsil ([place]) 11/10/2011  . PLMD (periodic limb movement disorder) 09/13/2011  . OSA (obstructive sleep apnea) 09/13/2011  . Pain in limb 08/08/2011  . Actinic keratosis 01/28/2011  . Vitamin B 12 deficiency 01/28/2011  . Spondylosis 08/02/2010  . Insomnia 08/02/2010    Past Surgical History:  Procedure Laterality Date  . BALLOON DILATION N/A 11/10/2014   Procedure: CYSTO BALLOON DILATION AND RETROGRADE URETHROGRAM ;  Surgeon: Bjorn Loser, MD;  Location: [facility];  Service:  Urology;  Laterality: N/A;  . CATARACT EXTRACTION W/ INTRAOCULAR LENS  IMPLANT, BILATERAL    . CYSTO/ BALLOON DILATION OF  URETHRAL STRICTURE  12-25-2010  . CYSTOSCOPY WITH URETHRAL DILATATION  05/31/2011   Procedure: CYSTOSCOPY WITH URETHRAL DILATATION;  Surgeon: Reece Packer, MD;  Location: [facility];  Service: Urology;  Laterality: N/A;  BALLOON DILATION  . CYSTOSCOPY/RETROGRADE/URETEROSCOPY N/A 05/22/2012   Procedure: CYSTOSCOPY BALLOON DILATION RETROGRADE URETEROGRAM ;   Surgeon: Reece Packer, MD;  Location[facility];  Service: Urology;  Laterality: N/A;  . CYTSO/  DILATATION URETHRAL STRICTURE/  BX PROSTATIC URETHRA/  REMOVAL FOREIGN BODIES  06-25-2010   [facility]  . INGUINAL HERNIA REPAIR Right 2000  . RADIOACTIVE SEED IMPLANTS, PROSTATE  JAN  2003  . SKIN LESION EXCISION  07/2012   MOST  right shoulder       Home Medications    Prior to Admission medications   Medication Sig Start Date End Date Taking? Authorizing Provider  alendronate (FOSAMAX) 70 MG tablet Take 1 tablet (70 mg total) by mouth every 7 (seven) days. Take with a full glass of water on an empty stomach. 02/25/16   Tiffany L Reed, DO  Ascorbic Acid (VITAMIN C) 1000 MG tablet Take 1,000 mg by mouth daily.    Historical Provider, MD  ASHWAGANDHA PO Take by mouth daily.    Historical Provider, MD  buPROPion (WELLBUTRIN) 100 MG tablet TAKE ONE TABLET BY MOUTH TWICE DAILY 02/18/16   Tiffany L Reed, DO  CALCIUM PO Take 800 mg by mouth daily. TWO  400 MG TAB    Historical Provider, MD  Cholecalciferol 4000 units CAPS Take by mouth daily.    Historical Provider, MD  Coenzyme Q10 (COQ10) 100 MG CAPS Take 100 mg by mouth daily. Take one daily    Historical Provider, MD  COLLAGEN PO Take 600 mg by mouth 2 (two) times daily. Take 600 mg twice daily.    Historical Provider, MD  cyanocobalamin 100 MCG tablet Take 100 mcg by mouth daily.    Historical Provider, MD  DIGESTIVE ENZYMES PO Take 500 mg by mouth every morning.     Historical Provider, MD  fish oil-omega-3 fatty acids 1000 MG capsule Take 2 g by mouth daily.    Historical Provider, MD  GLUCOSAMINE-CHONDROITIN-MSM PO Take 500 mg by mouth 2 (two) times daily. Take 500 mg twice daily.    Historical Provider, MD  Hyaluronic Acid-Vitamin C (HYALURONIC ACID PO) Take by mouth. Take 100mg  twice daily for joinits    Historical Provider, MD  Menaquinone-7 (VITAMIN K2 PO) Take by mouth daily.    Historical Provider, MD  Multiple  Vitamins-Minerals (MULTIVITAMINS THER. W/MINERALS) TABS Take 2 tablets by mouth 2 (two) times daily.     Historical Provider, MD  NP THYROID 15 MG tablet Take 15 mg by mouth daily. Take with 25mcg tab 11/09/15   Historical Provider, MD  NP THYROID 90 MG tablet Take 90 mg by mouth daily. Take with the 56mcg tab 11/09/15   Historical Provider, MD  predniSONE (DELTASONE) 20 MG tablet 60mg  daily for one week, then 20mg  po bid for thereafter 02/25/16   Tiffany L Reed, DO  RESVERATROL PO Take 100 mg by mouth 2 (two) times daily. Take 2 100 mg tablets twice daily.    Historical Provider, MD  TURMERIC CURCUMIN PO Take 650 mg by mouth every morning.    Historical Provider, MD  vitamin E (NATURAL VITAMIN E) 400 UNIT capsule  Take 400 Units by mouth daily.    Historical Provider, MD    Family History Family History  Problem Relation Age of Onset  . Stroke Father     Social History Social History  Substance Use Topics  . Smoking status: Never Smoker  . Smokeless tobacco: Never Used  . Alcohol use 4.2 oz/week    7 Glasses of wine per week     Comment: 8oz     Allergies   Cephalosporins and Suprax [cefixime]   Review of Systems Review of Systems  Constitutional: Negative for chills and fever.  Eyes: Negative for visual disturbance.  Musculoskeletal: Positive for arthralgias.  Skin: Negative.   Neurological: Positive for numbness. Negative for dizziness, syncope, weakness and headaches.  All other systems reviewed and are negative.    Physical Exam Updated Vital Signs BP (!) 210/104 (BP Location: Right Arm)   Pulse (!) 57   Temp 98.6 F (37 C) (Oral)   Resp 19   Ht 5\' 11"  (1.803 m)   Wt 68.9 kg   SpO2 100%   BMI 21.20 kg/m   Physical Exam  Constitutional: He appears well-developed and well-nourished. No distress.  Mild discomfort due to pain   HENT:  Head: Normocephalic and atraumatic.  Eyes: EOM are normal. Pupils are equal, round, and reactive to light.  Neck: Normal  range of motion. Neck supple.  No midline tenderness. No deformity or step offs noted.   Cardiovascular: Normal rate, regular rhythm, normal heart sounds and intact distal pulses.  Exam reveals no friction rub.   No murmur heard. Pulmonary/Chest: Effort normal and breath sounds normal.  Musculoskeletal:       Right shoulder: He exhibits decreased range of motion, tenderness, bony tenderness, deformity, pain and abnormal pulse. He exhibits no swelling, no effusion, no crepitus, no laceration, no spasm and normal strength.  Axillary nerve intact. Pt able to abduct arm bilat. Sensation intact of bicep. Sensation intact to sharp /dull. Radial pulses are 2+ bilaterally. Limited rom due to [place] note and vitals reviewed.    ED Treatments / Results  Labs (all labs ordered are listed, but only abnormal results are displayed) Labs Reviewed - No data to display  EKG  EKG Interpretation None       Radiology Dg Shoulder Right  Result Date: 03/03/2016 CLINICAL DATA:  Postfall, possible dislocation. EXAM: RIGHT SHOULDER - 2+ VIEW COMPARISON:  None in PACs FINDINGS: There is acute inferior dislocation of the glenohumeral joint. It is presumably anterior as well. No acute fracture is observed. IMPRESSION: Acute inferior and presumably anterior dislocation of the right shoulder. Electronically Signed   By: David  Martinique M.D.   On: 03/03/2016 13:33   Dg Shoulder Right Portable  Result Date: 03/03/2016 CLINICAL DATA:  Post reduction EXAM: PORTABLE RIGHT SHOULDER COMPARISON:  Earlier same day FINDINGS: Humeral head is relocated into the bony glenoid. No definite Hill-Sachs or Bank[place] fracture. One could question a fracture of the neck of the glenoid. This is not definite. IMPRESSION: Relocated. Cannot rule out someone unusual fracture of the neck of the glenoid. Electronically Signed   By: Nelson Chimes M.D.   On: 03/03/2016 15:37    Procedures Procedures (including critical care  time)  Medications Ordered in ED Medications  oxyCODONE-acetaminophen (PERCOCET/ROXICET) 5-325 MG per tablet 1 tablet (1 tablet Oral Given 03/03/16 1249)  fentaNYL (SUBLIMAZE) injection 50 mcg (50 mcg Intravenous Given 03/03/16 1425)  lidocaine (PF) (XYLOCAINE) 1 % injection (30 mLs  Given  03/03/16 1457)  ondansetron (ZOFRAN) injection 4 mg (4 mg Intravenous Given 03/03/16 1457)     Initial Impression / Assessment and Plan / ED Course  I have reviewed the triage vital signs and the nursing notes.  Pertinent labs & imaging results that were available during my care of the patient were reviewed by me and considered in my medical decision making (see chart for details).     Pt presents to the ED after a mechanical fall. Right shoulder dislocation. Pt is neurovascularly intact. Shoulder was reduced by myself and Dr. Regenia Skeeter. No sedation was given. Local block and pain medicine. Xray was obtained after reduction that showed the shoulder was reduced. Pt continued to be neurovascularly intact. Possible glenoid fracture. Placed in sling and swath and given ortho referral. Pt bp elevated. Likely due to pain. Pt is on htn meds but takes them "once per week". Encouraged him to take daily. He denies any cp, sob, ha or vision changes. Follow up with pcp.  Pt is hemodynamically stable, in NAD, & able to ambulate in the ED. Pain has been managed & has no complaints prior to dc. Pt is comfortable with above plan and is stable for discharge at this time. All questions were answered prior to disposition. Strict return precautions for f/u to the ED were discussed. Pt seen and evaluated by DR. Goldston who helped perform reduction.    Final Clinical Impressions(s) / ED Diagnoses   Final diagnoses:  Dislocation of right shoulder joint, initial encounter    New Prescriptions Discharge Medication List as of 03/03/2016  4:20 PM       Doristine Devoid, PA-C 03/04/16 1100    Sherwood Gambler, MD 03/10/16  1558

## 2016-03-03 NOTE — Discharge Instructions (Signed)
You shoulder is back in is correct location the x-ray cannot rule out a fracture. Please keep the sling on. Motrin and Tylenol for pain. Please take your blood pressure medicine as prescribed daily. Please call Dr. Stann Mainland office today to schedule an appointment with orthopedist. Return to the ED if he develops worsening symptoms.

## 2016-03-03 NOTE — ED Notes (Signed)
Pt verbalized understanding discharge instructions and denies any further needs or questions at this time, ambulatory and steady gait.

## 2016-03-03 NOTE — ED Provider Notes (Signed)
Medical screening examination/treatment/procedure(s) were conducted as a shared visit with non-physician practitioner(s) and myself.  I personally evaluated the patient during the encounter.   EKG Interpretation None      Patient with trip and fall and shoulder dislocation. Able to tolerate reduction with joint block and fentanyl. Had some nausea during procedure but otherwise uncomplicated. Fu with ortho  Reduction of dislocation Date/Time: 3:09 PM Performed by: Ephraim Hamburger Authorized by: Sherwood Gambler T Consent: Verbal consent obtained. Risks and benefits: risks, benefits and alternatives were discussed Consent given by: patient Required items: required blood products, implants, devices, and special equipment available Time out: Immediately prior to procedure a "time out" was called to verify the correct patient, procedure, equipment, support staff and site/side marked as required.  Patient sedated: NO  Vitals: Vital signs were monitored during sedation. Patient tolerance: Patient tolerated the procedure well with no immediate complications. Joint: Right shoulder Reduction technique: Milch  JOINT BLOCK Performed by: Sherwood Gambler T Consent: Verbal consent obtained. Required items: required blood products, implants, devices, and special equipment available Time out: Immediately prior to procedure a "time out" was called to verify the correct patient, procedure, equipment, support staff and site/side marked as required.  Indication: right shoulder dislocation Nerve block body site: right shoulder joint  Preparation: Patient was prepped and draped in the usual sterile fashion. Needle gauge: 51 G Location technique: anatomical landmarks  Local anesthetic: lidocaine 1% without  Anesthetic total: 10 ml  Outcome: pain improved Patient tolerance: Patient tolerated the procedure well with no immediate complications.      Sherwood Gambler, MD 03/03/16 [phone]

## 2016-03-03 NOTE — ED Triage Notes (Signed)
Pt presents with mechanical fall PTA, reports falling onto R shoulder, reports severe pain to R shoulder

## 2016-03-07 DIAGNOSIS — M5383 Other specified dorsopathies, cervicothoracic region: Secondary | ICD-10-CM | POA: Diagnosis not present

## 2016-03-07 DIAGNOSIS — M9901 Segmental and somatic dysfunction of cervical region: Secondary | ICD-10-CM | POA: Diagnosis not present

## 2016-03-07 DIAGNOSIS — M9902 Segmental and somatic dysfunction of thoracic region: Secondary | ICD-10-CM | POA: Diagnosis not present

## 2016-03-07 DIAGNOSIS — M25511 Pain in right shoulder: Secondary | ICD-10-CM | POA: Diagnosis not present

## 2016-03-07 DIAGNOSIS — M9907 Segmental and somatic dysfunction of upper extremity: Secondary | ICD-10-CM | POA: Diagnosis not present

## 2016-03-10 DIAGNOSIS — S43014A Anterior dislocation of right humerus, initial encounter: Secondary | ICD-10-CM | POA: Diagnosis not present

## 2016-03-14 DIAGNOSIS — I776 Arteritis, unspecified: Secondary | ICD-10-CM | POA: Diagnosis not present

## 2016-03-14 DIAGNOSIS — Z79899 Other long term (current) drug therapy: Secondary | ICD-10-CM | POA: Diagnosis not present

## 2016-03-14 DIAGNOSIS — I10 Essential (primary) hypertension: Secondary | ICD-10-CM | POA: Diagnosis not present

## 2016-03-14 DIAGNOSIS — N183 Chronic kidney disease, stage 3 (moderate): Secondary | ICD-10-CM | POA: Diagnosis not present

## 2016-03-14 DIAGNOSIS — R799 Abnormal finding of blood chemistry, unspecified: Secondary | ICD-10-CM | POA: Diagnosis not present

## 2016-03-15 DIAGNOSIS — N359 Urethral stricture, unspecified: Secondary | ICD-10-CM | POA: Diagnosis not present

## 2016-03-21 DIAGNOSIS — S43004D Unspecified dislocation of right shoulder joint, subsequent encounter: Secondary | ICD-10-CM | POA: Diagnosis not present

## 2016-03-23 DIAGNOSIS — R339 Retention of urine, unspecified: Secondary | ICD-10-CM | POA: Diagnosis not present

## 2016-03-23 DIAGNOSIS — R3914 Feeling of incomplete bladder emptying: Secondary | ICD-10-CM | POA: Diagnosis not present

## 2016-03-25 DIAGNOSIS — R3914 Feeling of incomplete bladder emptying: Secondary | ICD-10-CM | POA: Diagnosis not present

## 2016-03-25 DIAGNOSIS — N3020 Other chronic cystitis without hematuria: Secondary | ICD-10-CM | POA: Diagnosis not present

## 2016-03-25 DIAGNOSIS — N359 Urethral stricture, unspecified: Secondary | ICD-10-CM | POA: Diagnosis not present

## 2016-03-28 ENCOUNTER — Other Ambulatory Visit: Payer: Self-pay | Admitting: Urology

## 2016-03-28 ENCOUNTER — Encounter (HOSPITAL_BASED_OUTPATIENT_CLINIC_OR_DEPARTMENT_OTHER): Payer: Self-pay | Admitting: *Deleted

## 2016-03-28 NOTE — Progress Notes (Signed)
NPO AFTER MN.  ARRIVE AT 0700.  NEEDS ISTAT AND EKG.  WILL TAKE AZASAN, NP THYROID, PREDNISONE, AND WELLBUTRIN AM DOS W/ SIPS OF WATER.

## 2016-03-29 DIAGNOSIS — N359 Urethral stricture, unspecified: Secondary | ICD-10-CM | POA: Diagnosis not present

## 2016-03-29 NOTE — H&P (Signed)
CC/HPI: 2016  Since Mr Careaga's balloon dilation, he says the catheter goes in beautifully, supporting this is the primary cause and not just a false passage. When I showed him some catheters, he is using a Product manager with a very mild Coud. He is going to try a Rusch 14-French Coud green-tip catheter and fill the prescription I gave him if it works better.   The patient is having difficulty catheterizing came in again. I reviewed my last note from 2016 under anesthesia   In the last several days the patient's had trouble catheterizing. He usually uses his 14 French catheter but then will switch to the green-tip catheter when needed. Half the time he affiliated down to catheterize. When he does this sometimes he still is difficulty and other times it goes in easily.   He has no cystitis symptoms   He was scanned for 242 mL the last catheterized 5 or 6 hours ago   There is no other aggravating or relieving factors  There is no other associated signs and symptoms  The severity of the symptoms is moderate  The symptoms are ongoing and bothersome   The patient I spoke about cystoscopy and balloon dilation as we have done twice in the past. He has a very complicated presentation. Again he should not have a transurethral resection of the prostate as it would lead to incontinence in my opinion. The role of a suprapubic tube was mentioned as a potential long-term option but especially if transurethrally a dilation followed by successful catheterization did not become a reality.   I did not think cystoscopy plate a roll today because of the complexity of his problem and that he could catheterize with difficulty   We talked about balloon dilation in detail. Pros, cons, general surgical and anesthetic risks, and other options including watchful waiting were discussed. He understands that dilation is generally successful in most cases but long-term success rates are low. We talked about the risk of persistent,  de novo, or worsening incontinence and voiding dysfunction. Risks were described but not limited to the discussion of injury to neighboring structures and soft tissues. Bleeding, infection, pain, erectile dysfunction, and spraying of urination were discussed. The risk of neuropathy was discussed as well as the usual post-operative course.   The possibility of a suprapubic tube intraoperatively or postoperatively was discussed. Once again I spoke to the patient Y a transurethral procedure would not be his best interest and how he is likely catching in the false passage. Again he understands that I VAC treatment surprise of the dilation has worked so well because we can't fix the polyp false passage issue   He agreed with the plan        ALLERGIES: Suprax TABS    MEDICATIONS: Acetyl L-Carnitine CAPS Oral  Arginine TABS Oral  Bromelain TABS Oral  Bupropion Hcl 100 mg tablet Oral  Calcium TABS Oral  Chondroitin Sulfate CAPS Oral  Collagen CAPS Oral  CoQ-10 10 MG CAPS Oral  DHA Complete CAPS Oral  Digestive Enzymes Oral Tablet Oral  EPA Plus CAPS Oral  Glucosamine CAPS Oral  Hyaluronic Acid Powder Does Not Apply  L-Arginine TABS Oral  L-Citrulline CAPS Oral  Lisinopril 10 mg tablet Oral  Magnesium TABS Oral  MSM 1000 MG Oral Tablet Oral  Multi-Vitamin TABS Oral  Nature-Throid 180 mg tablet Oral  Nature-Throid 146.25 mg tablet Oral  Pepsin Powder Does Not Apply  Potassium Chloride Powder Does Not Apply  Prednisone  Quercetin TABS Oral  Resveratrol 100 MG Oral Capsule Oral  Silica CAPS Oral  Strontium Chloride Crystals Does Not Apply  Vanadium CAPS Oral  Vitamin D CAPS Oral  Vitamin D3 CAPS Oral  Vitamin E CAPS Oral  Vitamin K TABS Oral     GU PSH: Catheterize For Residual - 03/23/2016 Cysto Dilate Stricture (M or F) - 11/14/2014, 2014, 2013, 2012      PSH Notes: Cystoscopy For Urethral Stricture, Cystoscopy For Urethral Stricture, Cystoscopy For Urethral Stricture, Hernia  Repair, Urethra Surgery, Cystoscopy For Urethral Stricture   NON-GU PSH: Hernia Repair - 2013    GU PMH: Incomplete bladder emptying - 03/23/2016 Urethral Stricture, Unspec, Urethral stricture - 11/19/2014 ED, arterial insufficiency, Erectile dysfunction due to arterial insufficiency - 07/03/2014 Recurrent Cystitis w/o hematuria, Chronic cystitis - 07/03/2014 Prostate Cancer, History, Prostate Cancer - 2014 Urinary Retention, Unspec, Incomplete bladder emptying - 2014 Urinary Tract Inf, Unspec site, Urinary tract infection - 2014      PMH Notes:  2012-05-07 14:57:00 - Note: Laryngeal Cancer   NON-GU PMH: Encounter for general adult medical examination without abnormal findings, Encounter for preventive health examination - 11/10/2014 Personal history of other diseases of the nervous system and sense organs, History of sleep apnea - 2014 Personal history of other mental and behavioral disorders, History of depression - 2014    FAMILY HISTORY: Class I Angina - Father, Sister Death In The Family Father - Father Death In The Family Mother - Mother Family Health Status Number - Runs In Family   SOCIAL HISTORY: No Social History     Notes: Marital History - Currently Married, Occupation: Retired, Caffeine Use, Never A Smoker, Alcohol Use, Tobacco Use   REVIEW OF SYSTEMS:    GU Review Male:   Patient denies frequent urination, hard to postpone urination, burning/ pain with urination, get up at night to urinate, leakage of urine, stream starts and stops, trouble starting your stream, have to strain to urinate , erection problems, and penile pain.  Gastrointestinal (Upper):   Patient denies nausea, vomiting, and indigestion/ heartburn.  Gastrointestinal (Lower):   Patient denies diarrhea and constipation.  Constitutional:   Patient denies fever, night sweats, weight loss, and fatigue.  Skin:   Patient denies skin rash/ lesion and itching.  Eyes:   Patient denies blurred vision and double  vision.  Ears/ Nose/ Throat:   Patient denies sore throat and sinus problems.  Hematologic/Lymphatic:   Patient denies easy bruising and swollen glands.  Cardiovascular:   Patient denies leg swelling and chest pains.  Respiratory:   Patient denies cough and shortness of breath.  Endocrine:   Patient denies excessive thirst.  Musculoskeletal:   Patient denies back pain and joint pain.  Neurological:   Patient denies headaches and dizziness.  Psychologic:   Patient denies depression and anxiety.   VITAL SIGNS:      03/25/2016 02:12 PM  Weight 153 lb / 69.4 kg  Height 71 in / 180.34 cm  BP 147/91 mmHg  Pulse 76 /min  Temperature 98.2 F / 37 C  BMI 21.3 kg/m   PAST DATA REVIEWED:  Source Of History:  Patient   PROCEDURES:            PVR Ultrasound - 97353  Scanned Volume: 242 cc   ASSESSMENT:      ICD-10 Details  1 GU:   Incomplete bladder emptying - R39.14   2   Recurrent Cystitis w/o hematuria - N30.20   3   Urethral Stricture, Unspec - N35.9  PLAN:           Schedule Return Visit/Planned Activity: ASAP - Schedule Surgery  After a thorough review of the management options for the patient's condition the patient  elected to proceed with surgical therapy as noted above. We have discussed the potential benefits and risks of the procedure, side effects of the proposed treatment, the likelihood of the patient achieving the goals of the procedure, and any potential problems that might occur during the procedure or recuperation. Informed consent has been obtained.

## 2016-03-30 ENCOUNTER — Encounter (HOSPITAL_BASED_OUTPATIENT_CLINIC_OR_DEPARTMENT_OTHER): Payer: Self-pay | Admitting: *Deleted

## 2016-03-30 ENCOUNTER — Encounter (HOSPITAL_BASED_OUTPATIENT_CLINIC_OR_DEPARTMENT_OTHER)
Admission: RE | Disposition: A | Discharge status: Home / Self Care | Payer: Self-pay | Source: Ambulatory Visit | Attending: Urology

## 2016-03-30 ENCOUNTER — Ambulatory Visit (HOSPITAL_BASED_OUTPATIENT_CLINIC_OR_DEPARTMENT_OTHER): Payer: Medicare Other | Admitting: Anesthesiology

## 2016-03-30 ENCOUNTER — Ambulatory Visit (HOSPITAL_BASED_OUTPATIENT_CLINIC_OR_DEPARTMENT_OTHER)
Admission: RE | Admit: 2016-03-30 | Discharge: 2016-03-30 | Disposition: A | Discharge status: Home / Self Care | Payer: Medicare Other | Source: Ambulatory Visit | Attending: Urology | Admitting: Urology

## 2016-03-30 DIAGNOSIS — N3020 Other chronic cystitis without hematuria: Secondary | ICD-10-CM | POA: Diagnosis not present

## 2016-03-30 DIAGNOSIS — I10 Essential (primary) hypertension: Secondary | ICD-10-CM | POA: Diagnosis not present

## 2016-03-30 DIAGNOSIS — Z7952 Long term (current) use of systemic steroids: Secondary | ICD-10-CM | POA: Insufficient documentation

## 2016-03-30 DIAGNOSIS — G4730 Sleep apnea, unspecified: Secondary | ICD-10-CM | POA: Insufficient documentation

## 2016-03-30 DIAGNOSIS — E039 Hypothyroidism, unspecified: Secondary | ICD-10-CM | POA: Diagnosis not present

## 2016-03-30 DIAGNOSIS — Z79899 Other long term (current) drug therapy: Secondary | ICD-10-CM | POA: Diagnosis not present

## 2016-03-30 DIAGNOSIS — F329 Major depressive disorder, single episode, unspecified: Secondary | ICD-10-CM | POA: Insufficient documentation

## 2016-03-30 DIAGNOSIS — N359 Urethral stricture, unspecified: Secondary | ICD-10-CM | POA: Insufficient documentation

## 2016-03-30 DIAGNOSIS — R339 Retention of urine, unspecified: Secondary | ICD-10-CM | POA: Insufficient documentation

## 2016-03-30 DIAGNOSIS — K219 Gastro-esophageal reflux disease without esophagitis: Secondary | ICD-10-CM | POA: Insufficient documentation

## 2016-03-30 DIAGNOSIS — Z8546 Personal history of malignant neoplasm of prostate: Secondary | ICD-10-CM | POA: Insufficient documentation

## 2016-03-30 DIAGNOSIS — E785 Hyperlipidemia, unspecified: Secondary | ICD-10-CM | POA: Diagnosis not present

## 2016-03-30 DIAGNOSIS — N358 Other urethral stricture: Secondary | ICD-10-CM | POA: Diagnosis not present

## 2016-03-30 HISTORY — DX: Aortic arch syndrome (takayasu): M31.4

## 2016-03-30 HISTORY — DX: Chronic kidney disease, stage 3 unspecified: N18.30

## 2016-03-30 HISTORY — DX: Atherosclerosis of renal artery: I70.1

## 2016-03-30 HISTORY — DX: Chronic kidney disease, stage 3 (moderate): N18.3

## 2016-03-30 HISTORY — DX: Other giant cell arteritis: M31.6

## 2016-03-30 HISTORY — DX: Aneurysm of renal artery: I72.2

## 2016-03-30 HISTORY — DX: Atrophy of kidney (terminal): N26.1

## 2016-03-30 HISTORY — DX: Cyst of kidney, acquired: N28.1

## 2016-03-30 HISTORY — PX: CYSTOSCOPY WITH RETROGRADE URETHROGRAM: SHX6309

## 2016-03-30 LAB — POCT I-STAT, CHEM 8
BUN: 26 mg/dL — AB (ref 6–20)
CALCIUM ION: 1.17 mmol/L (ref 1.15–1.40)
Chloride: 103 mmol/L (ref 101–111)
Creatinine, Ser: 1.4 mg/dL — ABNORMAL HIGH (ref 0.61–1.24)
Glucose, Bld: 94 mg/dL (ref 65–99)
HEMATOCRIT: 39 % (ref 39.0–52.0)
Hemoglobin: 13.3 g/dL (ref 13.0–17.0)
Potassium: 4.4 mmol/L (ref 3.5–5.1)
Sodium: 140 mmol/L (ref 135–145)
TCO2: 29 mmol/L (ref 0–100)

## 2016-03-30 SURGERY — CYSTOSCOPY WITH RETROGRADE URETHROGRAM
Anesthesia: Monitor Anesthesia Care | Site: Urethra

## 2016-03-30 MED ORDER — LACTATED RINGERS IV SOLN
INTRAVENOUS | Status: DC
  Administered 2016-03-30: 08:00:00 via INTRAVENOUS
  Filled 2016-03-30: qty 1000

## 2016-03-30 MED ORDER — PROPOFOL 500 MG/50ML IV EMUL
INTRAVENOUS | Status: AC
  Filled 2016-03-30: qty 50

## 2016-03-30 MED ORDER — STERILE WATER FOR IRRIGATION IR SOLN
Status: DC | PRN
  Administered 2016-03-30: 1

## 2016-03-30 MED ORDER — DEXAMETHASONE SODIUM PHOSPHATE 10 MG/ML IJ SOLN
INTRAMUSCULAR | Status: AC
  Filled 2016-03-30: qty 1

## 2016-03-30 MED ORDER — ONDANSETRON HCL 4 MG/2ML IJ SOLN
INTRAMUSCULAR | Status: DC | PRN
  Administered 2016-03-30: 4 mg via INTRAVENOUS

## 2016-03-30 MED ORDER — ONDANSETRON HCL 4 MG/2ML IJ SOLN
INTRAMUSCULAR | Status: AC
  Filled 2016-03-30: qty 2

## 2016-03-30 MED ORDER — ONDANSETRON HCL 4 MG/2ML IJ SOLN
4.0000 mg | Freq: Once | INTRAMUSCULAR | Status: DC | PRN
  Filled 2016-03-30: qty 2

## 2016-03-30 MED ORDER — OXYCODONE HCL 5 MG PO TABS
5.0000 mg | ORAL_TABLET | Freq: Once | ORAL | Status: DC | PRN
  Filled 2016-03-30: qty 1

## 2016-03-30 MED ORDER — FENTANYL CITRATE (PF) 100 MCG/2ML IJ SOLN
INTRAMUSCULAR | Status: DC | PRN
  Administered 2016-03-30 (×2): 25 ug via INTRAVENOUS
  Administered 2016-03-30: 50 ug via INTRAVENOUS

## 2016-03-30 MED ORDER — OXYCODONE HCL 5 MG/5ML PO SOLN
5.0000 mg | Freq: Once | ORAL | Status: DC | PRN
  Filled 2016-03-30: qty 5

## 2016-03-30 MED ORDER — LIDOCAINE 2% (20 MG/ML) 5 ML SYRINGE
INTRAMUSCULAR | Status: AC
  Filled 2016-03-30: qty 5

## 2016-03-30 MED ORDER — DEXAMETHASONE SODIUM PHOSPHATE 4 MG/ML IJ SOLN
INTRAMUSCULAR | Status: DC | PRN
  Administered 2016-03-30: 10 mg via INTRAVENOUS

## 2016-03-30 MED ORDER — PROPOFOL 500 MG/50ML IV EMUL
INTRAVENOUS | Status: DC | PRN
  Administered 2016-03-30: 150 ug/kg/min via INTRAVENOUS

## 2016-03-30 MED ORDER — FENTANYL CITRATE (PF) 100 MCG/2ML IJ SOLN
INTRAMUSCULAR | Status: AC
  Filled 2016-03-30: qty 2

## 2016-03-30 MED ORDER — CIPROFLOXACIN IN D5W 400 MG/200ML IV SOLN
INTRAVENOUS | Status: AC
  Filled 2016-03-30: qty 200

## 2016-03-30 MED ORDER — FENTANYL CITRATE (PF) 100 MCG/2ML IJ SOLN
25.0000 ug | INTRAMUSCULAR | Status: DC | PRN
  Filled 2016-03-30: qty 1

## 2016-03-30 MED ORDER — IOHEXOL 300 MG/ML  SOLN
INTRAMUSCULAR | Status: DC | PRN
  Administered 2016-03-30: 100 mL via URETHRAL

## 2016-03-30 MED ORDER — PROPOFOL 10 MG/ML IV BOLUS
INTRAVENOUS | Status: AC
  Filled 2016-03-30: qty 40

## 2016-03-30 MED ORDER — CIPROFLOXACIN IN D5W 400 MG/200ML IV SOLN
400.0000 mg | INTRAVENOUS | Status: DC
  Filled 2016-03-30: qty 200

## 2016-03-30 SURGICAL SUPPLY — 16 items
BAG DRAIN URO-CYSTO SKYTR STRL (DRAIN) ×3 IMPLANT
BAG URINE LEG 500ML (DRAIN) ×3 IMPLANT
BALLN NEPHROSTOMY (BALLOONS) ×3
BALLOON NEPHROSTOMY (BALLOONS) ×1 IMPLANT
CATH FOLEY 2W COUNCIL 5CC 16FR (CATHETERS) ×3 IMPLANT
CLOTH BEACON ORANGE TIMEOUT ST (SAFETY) ×3 IMPLANT
GLOVE BIO SURGEON STRL SZ7.5 (GLOVE) ×3 IMPLANT
GOWN STRL REUS W/ TWL XL LVL3 (GOWN DISPOSABLE) ×1 IMPLANT
GOWN STRL REUS W/TWL XL LVL3 (GOWN DISPOSABLE) ×2
GUIDEWIRE STR DUAL SENSOR (WIRE) ×3 IMPLANT
KIT RM TURNOVER CYSTO AR (KITS) ×3 IMPLANT
MANIFOLD NEPTUNE II (INSTRUMENTS) ×3 IMPLANT
NS IRRIG 500ML POUR BTL (IV SOLUTION) ×3 IMPLANT
PACK CYSTO (CUSTOM PROCEDURE TRAY) ×3 IMPLANT
TUBE CONNECTING 12'X1/4 (SUCTIONS) ×1
TUBE CONNECTING 12X1/4 (SUCTIONS) ×2 IMPLANT

## 2016-03-30 NOTE — Op Note (Signed)
Preoperative diagnosis: Complex urethral stricture disease Postoperative diagnosis: Complex urethral stricture disease Surgery: Cystoscopy and balloon dilation of urethral stricture and scar gravity cystogram Surgeon: Dr. Nicki Reaper Leann Mayweather  The patient has the above diagnoses and consented to the above procedure. He had a positive urine culture representing colonization and is on ciprofloxacin. He had a Foley catheter from yesterday noted  The patient was prepped and draped in the usual fashion. A 17 French cystoscope was utilized. The penile and bulbar urethra were normal. He had significant bilobar enlargement of the prostate. It was long and a rigid. He had somewhat of a high riding bladder neck or small middle lobe though was minimal. He had a false passage that was significant and it appeared to be in the distal third of the prostatic urethra and/or near the membranous urethra and just distal to it. It was a moderate size cavity. The scope when directed upwards would easily go into the bladder but there was rigidity within the prostatic urethral lumen but not a true stricture  A sensor wire was easily placed curling in the bladder cystoscopically and fluoroscopically. The balloon dilation catheter was advanced and markers were utilized for correct position. He was balloon dilated for 15 minutes under 18 atm of pressure. The balloon was deflated and removed atraumatically  Over-the-wire I placed a 16 French [place] catheter easily.  Recognizing that in the future he may or may not need a suprapubic tube I did a lower volume cystogram. 60 milliliters of contrast was instilled in sterile water. Gravity cystogram was performed. AP view was taken. I stopped at around 500 mL and the bladder was filling up within the pelvis well above the symphysis pubis. I did not fill him to a higher volume recognizing he was under IV sedation. One could also see the enlarged prostate with radiation seeds.  As  previously noted his anatomy is very difficult. Hopefully this will help the coud catheter enter the bladder. I do believe that a bladder neck or prostate procedure would also threaten his continence mechanism.

## 2016-03-30 NOTE — Anesthesia Postprocedure Evaluation (Signed)
Anesthesia Post Note  Patient: Philip [name]  Procedure(s) Performed: Procedure(s) (LRB): CYSTOSCOPY WITH RETROGRADE URETHROGRAM AND BALLOON DILATION with cystogram (N/A)  Patient location during evaluation: PACU Anesthesia Type: MAC Level of consciousness: awake, awake and alert and oriented Pain management: pain level controlled Vital Signs Assessment: post-procedure vital signs reviewed and stable Respiratory status: spontaneous breathing, nonlabored ventilation, respiratory function stable and patient connected to nasal cannula oxygen Cardiovascular status: blood pressure returned to baseline Postop Assessment: no headache Anesthetic complications: no       Last Vitals:  Vitals:   03/30/16 1000 03/30/16 1026  BP: (!) 160/81 (!) 160/85  Pulse: (!) 57 (!) 56  Resp: 19 20  Temp:  36.5 C    Last Pain:  Vitals:   03/30/16 0708  TempSrc: Oral                 Monica Zahler COKER

## 2016-03-30 NOTE — Interval H&P Note (Signed)
History and Physical Interval Note:  03/30/2016 8:18 AM  Philip [name]  has presented today for surgery, with the diagnosis of [place]  The various methods of treatment have been discussed with the patient and family. After consideration of risks, benefits and other options for treatment, the patient has consented to  Procedure(s): CYSTOSCOPY WITH RETROGRADE URETHROGRAM AND BALLOON DILATION (N/A) as a surgical intervention .  The patient's history has been reviewed, patient examined, no change in status, stable for surgery.  I have reviewed the patient's chart and labs.  Questions were answered to the patient's satisfaction.     Audrionna Lampton A

## 2016-03-30 NOTE — Discharge Instructions (Signed)
°  Post Anesthesia Home Care Instructions  Activity: Get plenty of rest for the remainder of the day. A responsible adult should stay with you for 24 hours following the procedure.  For the next 24 hours, DO NOT: -Drive a car -Paediatric nurse -Drink alcoholic beverages -Take any medication unless instructed by your physician -Make any legal decisions or sign important papers.  Meals: Start with liquid foods such as gelatin or soup. Progress to regular foods as tolerated. Avoid greasy, spicy, heavy foods. If nausea and/or vomiting occur, drink only clear liquids until the nausea and/or vomiting subsides. Call your physician if vomiting continues.  Special Instructions/Symptoms: Your throat may feel dry or sore from the anesthesia or the breathing tube placed in your throat during surgery. If this causes discomfort, gargle with warm salt water. The discomfort should disappear within 24 hours.  If you had a scopolamine patch placed behind your ear for the management of post- operative nausea and/or vomiting:  1. The medication in the patch is effective for 72 hours, after which it should be removed.  Wrap patch in a tissue and discard in the trash. Wash hands thoroughly with soap and water. 2. You may remove the patch earlier than 72 hours if you experience unpleasant side effects which may include dry mouth, dizziness or visual disturbances. 3. Avoid touching the patch. Wash your hands with soap and water after contact with the patch.   CYSTOSCOPY HOME CARE INSTRUCTIONS  Activity: Rest for the remainder of the day.  Do not drive or operate equipment today.  You may resume normal activities in one to two days as instructed by your physician.   Meals: Drink plenty of liquids and eat light foods such as gelatin or soup this evening.  You may return to a normal meal plan tomorrow.  Return to Work: You may return to work in one to two days or as instructed by your physician.  Special  Instructions / Symptoms: Call your physician if any of these symptoms occur:   -persistent or heavy bleeding  -bleeding which continues after first few urination  -large blood clots that are difficult to pass  -urine stream diminishes or stops completely  -fever equal to or higher than 101 degrees Farenheit.  -cloudy urine with a strong, foul odor  -severe pain  Females should always wipe from front to back after elimination.  You may feel some burning pain when you urinate.  This should disappear with time.  Applying moist heat to the lower abdomen or a hot tub bath may help relieve the pain. \  Follow-Up / Date of Return Visit to Your Physician:   Call for an appointment to arrange follow-up.  Patient Signature:  ________________________________________________________  Nurse's Signature:  ________________________________________________________ I have reviewed discharge instructions in detail with the patient. They will follow-up with me or their physician as scheduled. My nurse will also be calling the patients as per protocol.

## 2016-03-30 NOTE — Transfer of Care (Signed)
Immediate Anesthesia Transfer of Care Note  Patient: Philip [name]  Procedure(s) Performed: Procedure(s): CYSTOSCOPY WITH RETROGRADE URETHROGRAM AND BALLOON DILATION with cystogram (N/A)  Patient Location: PACU  Anesthesia Type:MAC  Level of Consciousness: awake, alert , oriented and patient cooperative  Airway & Oxygen Therapy: Patient Spontanous Breathing and Patient connected to nasal cannula oxygen  Post-op Assessment: Report given to RN and Post -op Vital signs reviewed and stable  Post vital signs: Reviewed and stable  Last Vitals:  Vitals:   03/30/16 0708 03/30/16 0918  BP: 134/81   Pulse: 72 (!) 50  Resp: 16 (!) 9  Temp: 36.6 C 36.7 C    Last Pain:  Vitals:   03/30/16 0708  TempSrc: Oral      Patients Stated Pain Goal: 7 (01/74/94 4967)  Complications: No apparent anesthesia complications

## 2016-03-30 NOTE — Anesthesia Preprocedure Evaluation (Addendum)
Anesthesia Evaluation  Patient identified by MRN, date of birth, ID band Patient awake    Airway Mallampati: II  TM Distance: >3 FB Neck ROM: Full    Dental  (+) Teeth Intact, Dental Advisory Given, Partial Lower,    Pulmonary sleep apnea ,    Pulmonary exam normal breath sounds clear to auscultation       Cardiovascular hypertension, negative cardio ROS   Rhythm:Regular Rate:Normal     Neuro/Psych    GI/Hepatic GERD  Medicated and Controlled,  Endo/Other  Hypothyroidism   Renal/GU    S/p Prostate CA  Pt self caths     Musculoskeletal  (+) Arthritis , Osteoarthritis,    Abdominal   Peds  Hematology   Anesthesia Other Findings   Reproductive/Obstetrics                          Anesthesia Physical Anesthesia Plan  ASA: III  Anesthesia Plan: MAC   Post-op Pain Management:    Induction: Intravenous  Airway Management Planned: Nasal Cannula  Additional Equipment:   Intra-op Plan:   Post-operative Plan:   Informed Consent: I have reviewed the patients History and Physical, chart, labs and discussed the procedure including the risks, benefits and alternatives for the proposed anesthesia with the patient or authorized representative who has indicated his/her understanding and acceptance.   Dental advisory given  Plan Discussed with: Anesthesiologist, CRNA and Surgeon  Anesthesia Plan Comments:         Anesthesia Quick Evaluation

## 2016-03-30 NOTE — Anesthesia Preprocedure Evaluation (Addendum)
Anesthesia Evaluation    Airway Mallampati: II  TM Distance: >3 FB Neck ROM: Full    Dental  (+) Teeth Intact, Dental Advisory Given,    Pulmonary    Pulmonary exam normal        Cardiovascular Exercise Tolerance: Good hypertension,  Rhythm:Regular Rate:Normal     Neuro/Psych Depression    GI/Hepatic negative GI ROS, GERD  Medicated,  Endo/Other  Hypothyroidism   Renal/GU      Musculoskeletal  (+) Arthritis , Osteoarthritis,    Abdominal   Peds  Hematology   Anesthesia Other Findings   Reproductive/Obstetrics                            Anesthesia Physical Anesthesia Plan Anesthesia Quick Evaluation

## 2016-03-30 NOTE — Anesthesia Procedure Notes (Signed)
Procedure Name: MAC Date/Time: 03/30/2016 8:32 AM Performed by: Wanita Chamberlain Pre-anesthesia Checklist: Patient identified, Timeout performed, Emergency Drugs available, Suction available and Patient being monitored Patient Re-evaluated:Patient Re-evaluated prior to inductionOxygen Delivery Method: Nasal cannula Intubation Type: IV induction Placement Confirmation: CO2 detector Dental Injury: Teeth and Oropharynx as per pre-operative assessment

## 2016-03-31 ENCOUNTER — Encounter (HOSPITAL_BASED_OUTPATIENT_CLINIC_OR_DEPARTMENT_OTHER): Payer: Self-pay | Admitting: Urology

## 2016-04-04 DIAGNOSIS — R3914 Feeling of incomplete bladder emptying: Secondary | ICD-10-CM | POA: Diagnosis not present

## 2016-04-05 DIAGNOSIS — S43004D Unspecified dislocation of right shoulder joint, subsequent encounter: Secondary | ICD-10-CM | POA: Diagnosis not present

## 2016-04-07 DIAGNOSIS — Z79899 Other long term (current) drug therapy: Secondary | ICD-10-CM | POA: Diagnosis not present

## 2016-04-07 DIAGNOSIS — I776 Arteritis, unspecified: Secondary | ICD-10-CM | POA: Diagnosis not present

## 2016-04-07 DIAGNOSIS — I10 Essential (primary) hypertension: Secondary | ICD-10-CM | POA: Diagnosis not present

## 2016-04-07 DIAGNOSIS — N189 Chronic kidney disease, unspecified: Secondary | ICD-10-CM | POA: Diagnosis not present

## 2016-04-08 DIAGNOSIS — M25311 Other instability, right shoulder: Secondary | ICD-10-CM | POA: Diagnosis not present

## 2016-04-11 DIAGNOSIS — R319 Hematuria, unspecified: Secondary | ICD-10-CM | POA: Diagnosis not present

## 2016-04-11 DIAGNOSIS — I776 Arteritis, unspecified: Secondary | ICD-10-CM | POA: Diagnosis not present

## 2016-04-11 DIAGNOSIS — Z79899 Other long term (current) drug therapy: Secondary | ICD-10-CM | POA: Diagnosis not present

## 2016-04-11 DIAGNOSIS — N359 Urethral stricture, unspecified: Secondary | ICD-10-CM | POA: Diagnosis not present

## 2016-04-11 DIAGNOSIS — R7982 Elevated C-reactive protein (CRP): Secondary | ICD-10-CM | POA: Diagnosis not present

## 2016-04-11 DIAGNOSIS — N183 Chronic kidney disease, stage 3 (moderate): Secondary | ICD-10-CM | POA: Diagnosis not present

## 2016-04-25 DIAGNOSIS — D225 Melanocytic nevi of trunk: Secondary | ICD-10-CM | POA: Diagnosis not present

## 2016-04-25 DIAGNOSIS — L821 Other seborrheic keratosis: Secondary | ICD-10-CM | POA: Diagnosis not present

## 2016-04-25 DIAGNOSIS — L814 Other melanin hyperpigmentation: Secondary | ICD-10-CM | POA: Diagnosis not present

## 2016-04-25 DIAGNOSIS — D692 Other nonthrombocytopenic purpura: Secondary | ICD-10-CM | POA: Diagnosis not present

## 2016-04-25 DIAGNOSIS — L570 Actinic keratosis: Secondary | ICD-10-CM | POA: Diagnosis not present

## 2016-04-25 DIAGNOSIS — D045 Carcinoma in situ of skin of trunk: Secondary | ICD-10-CM | POA: Diagnosis not present

## 2016-04-25 DIAGNOSIS — D1801 Hemangioma of skin and subcutaneous tissue: Secondary | ICD-10-CM | POA: Diagnosis not present

## 2016-04-25 DIAGNOSIS — Z85828 Personal history of other malignant neoplasm of skin: Secondary | ICD-10-CM | POA: Diagnosis not present

## 2016-05-03 DIAGNOSIS — I776 Arteritis, unspecified: Secondary | ICD-10-CM | POA: Diagnosis not present

## 2016-05-03 DIAGNOSIS — N183 Chronic kidney disease, stage 3 (moderate): Secondary | ICD-10-CM | POA: Diagnosis not present

## 2016-05-03 DIAGNOSIS — R7982 Elevated C-reactive protein (CRP): Secondary | ICD-10-CM | POA: Diagnosis not present

## 2016-05-03 DIAGNOSIS — Z79899 Other long term (current) drug therapy: Secondary | ICD-10-CM | POA: Diagnosis not present

## 2016-05-11 DIAGNOSIS — I776 Arteritis, unspecified: Secondary | ICD-10-CM | POA: Diagnosis not present

## 2016-05-11 DIAGNOSIS — Z79899 Other long term (current) drug therapy: Secondary | ICD-10-CM | POA: Diagnosis not present

## 2016-05-11 DIAGNOSIS — N183 Chronic kidney disease, stage 3 (moderate): Secondary | ICD-10-CM | POA: Diagnosis not present

## 2016-05-11 DIAGNOSIS — M7531 Calcific tendinitis of right shoulder: Secondary | ICD-10-CM | POA: Diagnosis not present

## 2016-05-11 DIAGNOSIS — M8589 Other specified disorders of bone density and structure, multiple sites: Secondary | ICD-10-CM | POA: Diagnosis not present

## 2016-05-25 DIAGNOSIS — H43813 Vitreous degeneration, bilateral: Secondary | ICD-10-CM | POA: Diagnosis not present

## 2016-05-25 DIAGNOSIS — H353132 Nonexudative age-related macular degeneration, bilateral, intermediate dry stage: Secondary | ICD-10-CM | POA: Diagnosis not present

## 2016-05-25 DIAGNOSIS — D3132 Benign neoplasm of left choroid: Secondary | ICD-10-CM | POA: Diagnosis not present

## 2016-06-26 ENCOUNTER — Other Ambulatory Visit: Payer: Self-pay | Admitting: Internal Medicine

## 2016-07-13 DIAGNOSIS — E039 Hypothyroidism, unspecified: Secondary | ICD-10-CM | POA: Diagnosis not present

## 2016-07-13 DIAGNOSIS — I776 Arteritis, unspecified: Secondary | ICD-10-CM | POA: Diagnosis not present

## 2016-07-13 DIAGNOSIS — R5383 Other fatigue: Secondary | ICD-10-CM | POA: Diagnosis not present

## 2016-07-13 DIAGNOSIS — E559 Vitamin D deficiency, unspecified: Secondary | ICD-10-CM | POA: Diagnosis not present

## 2016-07-13 DIAGNOSIS — Z7952 Long term (current) use of systemic steroids: Secondary | ICD-10-CM | POA: Diagnosis not present

## 2016-07-15 ENCOUNTER — Telehealth: Payer: Self-pay | Admitting: Neurology

## 2016-07-15 NOTE — Telephone Encounter (Signed)
Pt calling to inform that his predniSONE (DELTASONE) 20 MG tablet is now down to 10 mg he was told when it was higher it would affect the reading of the sleep consult, now that is getting lower by the week he wants to know if he can do the sleep consult. Please call

## 2016-07-18 NOTE — Telephone Encounter (Signed)
Noted, thank you for scheduling him.

## 2016-07-18 NOTE — Telephone Encounter (Signed)
I got the pt schedule for an ov

## 2016-07-18 NOTE — Telephone Encounter (Signed)
Please schedule office appt, last appt in 4/17 and order for PSG is over a year ago.

## 2016-07-26 DIAGNOSIS — C44622 Squamous cell carcinoma of skin of right upper limb, including shoulder: Secondary | ICD-10-CM | POA: Diagnosis not present

## 2016-07-26 DIAGNOSIS — D485 Neoplasm of uncertain behavior of skin: Secondary | ICD-10-CM | POA: Diagnosis not present

## 2016-08-02 ENCOUNTER — Non-Acute Institutional Stay: Payer: Medicare Other

## 2016-08-02 VITALS — BP 112/78 | HR 83 | Temp 98.0°F | Ht 71.0 in | Wt 164.0 lb

## 2016-08-02 DIAGNOSIS — Z0000 Encounter for general adult medical examination without abnormal findings: Secondary | ICD-10-CM

## 2016-08-02 NOTE — Progress Notes (Signed)
Subjective:   Philip [name] is a 83 y.o. male who presents for Medicare Annual/Subsequent preventive examination.  Last AWV-05/20/2015    Objective:    Vitals: BP 112/78 (BP Location: Left Arm, Patient Position: Sitting)   Pulse 83   Temp 98 F (36.7 C) (Oral)   Ht 5\' 11"  (1.803 m)   Wt 164 lb (74.4 kg)   BMI 22.87 kg/m   Body mass index is 22.87 kg/m.  Tobacco History  Smoking Status  . Never Smoker  Smokeless Tobacco  . Never Used     Counseling given: Not Answered   Past Medical History:  Diagnosis Date  . Aneurysm of left renal artery (HCC)    per CT 08-20-2015 stable 54mm  . 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 Bladder neck contracture   . BPH (benign prostatic hyperplasia)   . CKD (chronic kidney disease), stage III   . DDD (degenerative disc disease)   . Full dentures   . GERD (gastroesophageal reflux disease)   . History of prostate cancer DX  2002   S/P EXTERNAL RADIATION/ RADIOACTIVE SEED IMPLANTS  2003  . History of squamous cell carcinoma excision    2012--  RIGHT LOWER EXTREM  . HTN (hypertension)   . Hyperlipemia   . Hypothyroidism   . Large vessel vasculitis (HCC)    aortitis--- rheumotologist-  dr Charlestine Night  . Left renal artery stenosis (HCC)    proximal per CT 08-20-2015 but patent  . Left renal atrophy   . Macular degeneration   . Nocturia 08/08/2011  . OSA (obstructive sleep apnea) intolerant cpap  . Renal cyst, right   . Restless legs syndrome (RLS) 10/31/2011  . S/P radiation therapy ended 01-26-2012    for tonsillar cancer (head and neck) /  and external beam radiation for prostate cancer 2002  . Saliva decreased   . Self-catheterizes urinary bladder    QID   AND   PRN  . 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 Urethral stricture   . Urinary retention with incomplete bladder emptying   . Vitamin B 12 deficiency 01/28/2011  . Vitamin D deficiency    Past Surgical History:  Procedure Laterality Date  . BALLOON DILATION N/A 11/10/2014   Procedure: CYSTO BALLOON DILATION AND RETROGRADE URETHROGRAM ;  Surgeon: Bjorn Loser, MD;  Location: [facility];  Service: Urology;  Laterality: N/A;  . CATARACT EXTRACTION W/ INTRAOCULAR LENS  IMPLANT, BILATERAL    . CYSTO/ BALLOON DILATION OF  URETHRAL STRICTURE  12-25-2010  . CYSTOSCOPY WITH RETROGRADE URETHROGRAM N/A 03/30/2016   Procedure: CYSTOSCOPY WITH RETROGRADE URETHROGRAM AND BALLOON DILATION with cystogram;  Surgeon: Bjorn Loser, MD;  Location: [facility];  Service: Urology;  Laterality: N/A;  . CYSTOSCOPY WITH URETHRAL DILATATION  05/31/2011   Procedure: CYSTOSCOPY WITH URETHRAL DILATATION;  Surgeon: Reece Packer, MD;  Location: [place];  Service: Urology;  Laterality: N/A;  BALLOON DILATION  . CYSTOSCOPY/RETROGRADE/URETEROSCOPY N/A 05/22/2012   Procedure: CYSTOSCOPY BALLOON DILATION RETROGRADE URETEROGRAM ;  Surgeon: Reece Packer, MD;  Location: [facility];  Service: Urology;  Laterality: N/A;  .  CYTSO/  DILATATION URETHRAL STRICTURE/  BX PROSTATIC URETHRA/  REMOVAL FOREIGN BODIES  06-25-2010   [facility]  . INGUINAL HERNIA REPAIR Right 2000  . RADIOACTIVE SEED IMPLANTS, PROSTATE  JAN  2003  . SKIN LESION EXCISION  07/2012   MOST  right shoulder   Family History  Problem Relation Age of Onset  . Stroke Father    History  Sexual Activity  . Sexual activity: Not on file    Outpatient Encounter Prescriptions as of 08/02/2016  Medication Sig  . Ascorbic Acid (VITAMIN C) 1000 MG tablet Take 1,000 mg by mouth daily.  . ASHWAGANDHA PO Take by mouth  daily.  Marland Kitchen azathioprine (AZASAN) 100 MG tablet Take 100 mg by mouth every morning.  Marland Kitchen buPROPion (WELLBUTRIN) 100 MG tablet TAKE ONE TABLET BY MOUTH TWICE DAILY  . CALCIUM PO Take 800 mg by mouth daily. TWO  400 MG TAB  . Cholecalciferol 4000 units CAPS Take by mouth daily.  . Coenzyme Q10 (COQ10) 100 MG CAPS Take 100 mg by mouth daily. Take one daily  . COLLAGEN PO Take 600 mg by mouth 2 (two) times daily. Take 600 mg twice daily.  . cyanocobalamin 100 MCG tablet Take 100 mcg by mouth daily.  Marland Kitchen DIGESTIVE ENZYMES PO Take 500 mg by mouth every morning.   . fish oil-omega-3 fatty acids 1000 MG capsule Take 2 g by mouth daily.  Marland Kitchen GLUCOSAMINE-CHONDROITIN-MSM PO Take 500 mg by mouth 2 (two) times daily. Take 500 mg twice daily.  Marland Kitchen Hyaluronic Acid-Vitamin C (HYALURONIC ACID PO) Take by mouth. Take 100mg  twice daily for joinits  . lisinopril (PRINIVIL,ZESTRIL) 10 MG tablet Take 10 mg by mouth 2 (two) times a week.  . Menaquinone-7 (VITAMIN K2 PO) Take by mouth daily.  . Multiple Vitamins-Minerals (MULTIVITAMINS THER. W/MINERALS) TABS Take 2 tablets by mouth 2 (two) times daily.   . NP THYROID 15 MG tablet Take 15 mg by mouth every morning. Take with 23mcg tab  . NP THYROID 90 MG tablet Take 90 mg by mouth every morning. Take with the 31mcg tab  . PREDNISONE PO Take 7.5 mg by mouth daily.  Marland Kitchen RESVERATROL PO Take 100 mg by mouth 2 (two) times daily. Take 2 100 mg tablets twice daily.  . TURMERIC CURCUMIN PO Take 650 mg by mouth every morning.  . vitamin E (NATURAL VITAMIN E) 400 UNIT capsule Take 400 Units by mouth daily.  . [DISCONTINUED] alendronate (FOSAMAX) 70 MG tablet Take 1 tablet (70 mg total) by mouth every 7 (seven) days. Take with a full glass of water on an empty stomach.  . [DISCONTINUED] predniSONE (DELTASONE) 20 MG tablet 60mg  daily for one week, then 20mg  po bid for thereafter (Patient taking differently: Take 20 mg by mouth every morning. 60mg  daily for one week, then 20mg  po bid for  thereafter)   No facility-administered encounter medications on file as of 08/02/2016.     Activities of Daily Living In your present state of health, do you have any difficulty performing the following activities: 08/02/2016 03/30/2016  Hearing? Y N  Vision? Y N  Difficulty concentrating or making decisions? Y N  Walking or climbing stairs? N N  Dressing or bathing? N N  Doing errands, shopping? N -  Preparing Food and eating ? N -  Using the Toilet? N -  In the past six months, have you accidently leaked urine? Y -  Do you have problems with loss of bowel control? N -  Managing your  Medications? N -  Managing your Finances? N -  Housekeeping or managing your Housekeeping? N -  Some recent data might be hidden    Patient Care Team: Gayland Curry, DO as PCP - General (Geriatric Medicine) Bjorn Loser, MD as Consulting Physician (Urology) [facility] Brizel, Youlanda Roys, MD (Radiation Oncology) Jacelyn Pi, MD as Consulting Physician (Endocrinology)   Assessment:     Exercise Activities and Dietary recommendations Current Exercise Habits: Home exercise routine, Type of exercise: walking;strength training/weights, Time (Minutes): 40, Frequency (Times/Week): 5, Weekly Exercise (Minutes/Week): 200, Intensity: Moderate, Exercise limited by: None identified  Goals    None     Fall Risk Fall Risk  08/02/2016 12/02/2015 08/19/2015 05/06/2015  Falls in the past year? Yes No No No  Number falls in past yr: 1 - - -  Injury with Fall? Yes - - -   Depression Screen PHQ 2/9 Scores 08/02/2016 12/02/2015 08/19/2015 05/06/2015  PHQ - 2 Score 0 0 0 0    Cognitive Function MMSE - Mini Mental State Exam 08/02/2016 05/20/2015  Orientation to time 4 5  Orientation to Place 5 5  Registration 3 3  Attention/ Calculation 5 5  Recall 3 3  Language- name 2 objects 2 2  Language- repeat 1 1  Language- follow 3 step command 3 3  Language- read & follow direction 1 1    Write a sentence 1 1  Copy design 1 1  Total score 29 30        Immunization History  Administered Date(s) Administered  . Pneumococcal Conjugate-13 05/06/2015  . Pneumococcal Polysaccharide-23 01/18/2003  . Td 01/18/2003   Screening Tests Health Maintenance  Topic Date Due  . TETANUS/TDAP  01/17/2013  . INFLUENZA VACCINE  08/17/2016  . PNA vac Low Risk Adult  Completed      Plan:    I have personally reviewed and addressed the Medicare Annual Wellness questionnaire and have noted the following in the patient's chart:  A. Medical and social history B. Use of alcohol, tobacco or illicit drugs  C. Current medications and supplements D. Functional ability and status E.  Nutritional status F.  Physical activity G. Advance directives H. List of other physicians I.  Hospitalizations, surgeries, and ER visits in previous 12 months J.  [place] to include hearing, vision, cognitive, depression L. Referrals and appointments - none  In addition, I have reviewed and discussed with patient certain preventive protocols, quality metrics, and best practice recommendations. A written personalized care plan for preventive services as well as general preventive health recommendations were provided to patient.  See attached scanned questionnaire for additional information.   Signed,   Rich Reining, RN Nurse Health Advisor   Quick Notes   Health Maintenance: TDAP and Shingles due. Pt will decide if he wants these     Abnormal Screen: MMSE 29/30. Passed clock drawing     Patient Concerns: pt would like vitamin d and vitamin b tested     Nurse Concerns: None

## 2016-08-02 NOTE — Patient Instructions (Addendum)
Philip [name] , Thank you for taking time to come for your Medicare Wellness Visit. I appreciate your ongoing commitment to your health goals. Please review the following plan we discussed and let me know if I can assist you in the future.   Screening recommendations/referrals: Colonoscopy up to date, patient is over age 81 Recommended yearly ophthalmology/optometry visit for glaucoma screening and checkup Recommended yearly dental visit for hygiene and checkup  Vaccinations: Influenza vaccine due this season Pneumococcal vaccine up to date Tdap vaccine due, if you decide you want this vaccine let us know and we will send in the prescription Shingles vaccine  due, if you decide you want this vaccine let us know and we will send in the prescription  Advanced directives: In chart  Conditions/risks identified: None  Next appointment: None scheduled  Preventive Care 65 Years and Older, Male Preventive care refers to lifestyle choices and visits with your health care provider that can promote health and wellness. What does preventive care include?  A yearly physical exam. This is also called an annual well check.  Dental exams once or twice a year.  Routine eye exams. Ask your health care provider how often you should have your eyes checked.  Personal lifestyle choices, including:  Daily care of your teeth and gums.  Regular physical activity.  Eating a healthy diet.  Avoiding tobacco and drug use.  Limiting alcohol use.  Practicing safe sex.  Taking low doses of aspirin every day.  Taking vitamin and mineral supplements as recommended by your health care provider. What happens during an annual well check? The services and screenings done by your health care provider during your annual well check will depend on your age, overall health, lifestyle risk factors, and family history of disease. Counseling  Your health care provider may ask you questions about your:  Alcohol  use.  Tobacco use.  Drug use.  Emotional well-being.  Home and relationship well-being.  Sexual activity.  Eating habits.  History of falls.  Memory and ability to understand (cognition).  Work and work Statistician. Screening  You may have the following tests or measurements:  Height, weight, and BMI.  Blood pressure.  Lipid and cholesterol levels. These may be checked every 5 years, or more frequently if you are over 48 years old.  Skin check.  Lung cancer screening. You may have this screening every year starting at age 64 if you have a 30-pack-year history of smoking and currently smoke or have quit within the past 15 years.  Fecal occult blood test (FOBT) of the stool. You may have this test every year starting at age 81.  Flexible sigmoidoscopy or colonoscopy. You may have a sigmoidoscopy every 5 years or a colonoscopy every 10 years starting at age 2.  Prostate cancer screening. Recommendations will vary depending on your family history and other risks.  Hepatitis C blood test.  Hepatitis B blood test.  Sexually transmitted disease (STD) testing.  Diabetes screening. This is done by checking your blood sugar (glucose) after you have not eaten for a while (fasting). You may have this done every 1-3 years.  Abdominal aortic aneurysm (AAA) screening. You may need this if you are a current or former smoker.  Osteoporosis. You may be screened starting at age 73 if you are at high risk. Talk with your health care provider about your test results, treatment options, and if necessary, the need for more tests. Vaccines  Your health care provider may recommend certain vaccines,  such as:  Influenza vaccine. This is recommended every year.  Tetanus, diphtheria, and acellular pertussis (Tdap, Td) vaccine. You may need a Td booster every 10 years.  Zoster vaccine. You may need this after age 66.  Pneumococcal 13-valent conjugate (PCV13) vaccine. One dose is  recommended after age 28.  Pneumococcal polysaccharide (PPSV23) vaccine. One dose is recommended after age 72. Talk to your health care provider about which screenings and vaccines you need and how often you need them. This information is not intended to replace advice given to you by your health care provider. Make sure you discuss any questions you have with your health care provider. Document Released: 01/30/2015 Document Revised: 09/23/2015 Document Reviewed: 11/04/2014 Elsevier Interactive Patient Education  2017 [place] Prevention in the Home Falls can cause injuries. They can happen to people of all ages. There are many things you can do to make your home safe and to help prevent falls. What can I do on the outside of my home?  Regularly fix the edges of walkways and driveways and fix any cracks.  Remove anything that might make you trip as you walk through a door, such as a raised step or threshold.  Trim any bushes or trees on the path to your home.  Use bright outdoor lighting.  Clear any walking paths of anything that might make someone trip, such as rocks or tools.  Regularly check to see if handrails are loose or broken. Make sure that both sides of any steps have handrails.  Any raised decks and porches should have guardrails on the edges.  Have any leaves, snow, or ice cleared regularly.  Use sand or salt on walking paths during winter.  Clean up any spills in your garage right away. This includes oil or grease spills. What can I do in the bathroom?  Use night lights.  Install grab bars by the toilet and in the tub and shower. Do not use towel bars as grab bars.  Use non-skid mats or decals in the tub or shower.  If you need to sit down in the shower, use a plastic, non-slip stool.  Keep the floor dry. Clean up any water that spills on the floor as soon as it happens.  Remove soap buildup in the tub or shower regularly.  Attach bath mats  securely with double-sided non-slip rug tape.  Do not have throw rugs and other things on the floor that can make you trip. What can I do in the bedroom?  Use night lights.  Make sure that you have a light by your bed that is easy to reach.  Do not use any sheets or blankets that are too big for your bed. They should not hang down onto the floor.  Have a firm chair that has side arms. You can use this for support while you get dressed.  Do not have throw rugs and other things on the floor that can make you trip. What can I do in the kitchen?  Clean up any spills right away.  Avoid walking on wet floors.  Keep items that you use a lot in easy-to-reach places.  If you need to reach something above you, use a strong step stool that has a grab bar.  Keep electrical cords out of the way.  Do not use floor polish or wax that makes floors slippery. If you must use wax, use non-skid floor wax.  Do not have throw rugs and other things on  the floor that can make you trip. What can I do with my stairs?  Do not leave any items on the stairs.  Make sure that there are handrails on both sides of the stairs and use them. Fix handrails that are broken or loose. Make sure that handrails are as long as the stairways.  Check any carpeting to make sure that it is firmly attached to the stairs. Fix any carpet that is loose or worn.  Avoid having throw rugs at the top or bottom of the stairs. If you do have throw rugs, attach them to the floor with carpet tape.  Make sure that you have a light switch at the top of the stairs and the bottom of the stairs. If you do not have them, ask someone to add them for you. What else can I do to help prevent falls?  Wear shoes that:  Do not have high heels.  Have rubber bottoms.  Are comfortable and fit you well.  Are closed at the toe. Do not wear sandals.  If you use a stepladder:  Make sure that it is fully opened. Do not climb a closed  stepladder.  Make sure that both sides of the stepladder are locked into place.  Ask someone to hold it for you, if possible.  Clearly mark and make sure that you can see:  Any grab bars or handrails.  First and last steps.  Where the edge of each step is.  Use tools that help you move around (mobility aids) if they are needed. These include:  Canes.  Walkers.  Scooters.  Crutches.  Turn on the lights when you go into a dark area. Replace any light bulbs as soon as they burn out.  Set up your furniture so you have a clear path. Avoid moving your furniture around.  If any of your floors are uneven, fix them.  If there are any pets around you, be aware of where they are.  Review your medicines with your doctor. Some medicines can make you feel dizzy. This can increase your chance of falling. Ask your doctor what other things that you can do to help prevent falls. This information is not intended to replace advice given to you by your health care provider. Make sure you discuss any questions you have with your health care provider. Document Released: 10/30/2008 Document Revised: 06/11/2015 Document Reviewed: 02/07/2014 Elsevier Interactive Patient Education  2017 [employer].

## 2016-08-24 DIAGNOSIS — R5383 Other fatigue: Secondary | ICD-10-CM | POA: Diagnosis not present

## 2016-08-24 DIAGNOSIS — Z7952 Long term (current) use of systemic steroids: Secondary | ICD-10-CM | POA: Diagnosis not present

## 2016-08-24 DIAGNOSIS — N289 Disorder of kidney and ureter, unspecified: Secondary | ICD-10-CM | POA: Diagnosis not present

## 2016-08-24 DIAGNOSIS — I776 Arteritis, unspecified: Secondary | ICD-10-CM | POA: Diagnosis not present

## 2016-08-25 DIAGNOSIS — C44622 Squamous cell carcinoma of skin of right upper limb, including shoulder: Secondary | ICD-10-CM | POA: Diagnosis not present

## 2016-08-25 DIAGNOSIS — L905 Scar conditions and fibrosis of skin: Secondary | ICD-10-CM | POA: Diagnosis not present

## 2016-08-29 ENCOUNTER — Encounter: Payer: Self-pay | Admitting: Neurology

## 2016-09-01 DIAGNOSIS — E039 Hypothyroidism, unspecified: Secondary | ICD-10-CM | POA: Diagnosis not present

## 2016-09-07 ENCOUNTER — Other Ambulatory Visit: Payer: Self-pay | Admitting: Urology

## 2016-09-07 DIAGNOSIS — R3914 Feeling of incomplete bladder emptying: Secondary | ICD-10-CM | POA: Diagnosis not present

## 2016-09-07 DIAGNOSIS — N359 Urethral stricture, unspecified: Secondary | ICD-10-CM | POA: Diagnosis not present

## 2016-09-08 ENCOUNTER — Encounter (HOSPITAL_BASED_OUTPATIENT_CLINIC_OR_DEPARTMENT_OTHER): Payer: Self-pay | Admitting: *Deleted

## 2016-09-08 DIAGNOSIS — I10 Essential (primary) hypertension: Secondary | ICD-10-CM | POA: Diagnosis not present

## 2016-09-08 DIAGNOSIS — E039 Hypothyroidism, unspecified: Secondary | ICD-10-CM | POA: Diagnosis not present

## 2016-09-08 DIAGNOSIS — I701 Atherosclerosis of renal artery: Secondary | ICD-10-CM | POA: Diagnosis not present

## 2016-09-08 DIAGNOSIS — Z8546 Personal history of malignant neoplasm of prostate: Secondary | ICD-10-CM | POA: Diagnosis not present

## 2016-09-08 DIAGNOSIS — M316 Other giant cell arteritis: Secondary | ICD-10-CM | POA: Diagnosis not present

## 2016-09-08 DIAGNOSIS — N183 Chronic kidney disease, stage 3 (moderate): Secondary | ICD-10-CM | POA: Diagnosis not present

## 2016-09-08 DIAGNOSIS — N320 Bladder-neck obstruction: Secondary | ICD-10-CM | POA: Diagnosis not present

## 2016-09-08 NOTE — Progress Notes (Signed)
NPO AFTER MN W/ EXCEPTION CLEAR LIQUIDS UNTIL 0630 (NO CREAM/ MILK PRODUCTS).   ARRIVE AT 1115.  NEEDS ISTAT.  CURRENT EKG IN CHART AND EPIC.  WILL TAKE PREDNISONE, AZASAN, WELLBUTRIN, NP THYROID AM DOS W/ SIPS OF WATER.

## 2016-09-12 NOTE — H&P (Signed)
CC/HPI: The patient was balloon dilated last week and he says the catheter now goes in beautifully. I was delighted to hear this. Clinically noninfected. Incomplete bladder and using stable   Reassess in 9 months. He would rather come back in a year but his wife and I encouraged sooner. We would then schedule him for another dilation before things get ahead of our selves depending upon his wishes   Today  The patient has been having off and on trouble catheterizing over the last few months. Yesterday he took about 30 minutes and he had delayed down in the bathtub to catheterize. He has good days and bad days. He has no blood pain or infection symptoms   There is no other aggravating or relieving factors  There is no other associated signs and symptoms  The severity of the symptoms is moderate  The symptoms are ongoing and bothersome   I reviewed the operative note in detail in March 2018. I think it is prudent to schedule him for the same procedure. He does have a very tenuous situation. I must say him almost surprised at times after the dilation that he does so well.   We had a lengthy discussion again about her disease. We talked about inability cure his problem. He has been radiated. He understands the development of balloon dilation now that he would not be a candidate for the study. He understands that he is almost a bladder cripple and would need a suprapubic tube or divergent if the procedure was not successful. Hopeful that this will work and that he is willing to continue to catheterize which is wonderful.   ALLERGIES: Suprax TABS   MEDICATIONS: Acetyl L-Carnitine CAPS Oral  Arginine TABS Oral  Bromelain TABS Oral  Bupropion Hcl 100 mg tablet Oral  Calcium TABS Oral  Chondroitin Sulfate CAPS Oral  Collagen CAPS Oral  CoQ-10 10 MG CAPS Oral  DHA Complete CAPS Oral  Digestive Enzymes Oral Tablet Oral  EPA Plus CAPS Oral  Glucosamine CAPS Oral  Hyaluronic Acid Powder Does Not Apply   L-Arginine TABS Oral  L-Citrulline CAPS Oral  Lisinopril 10 mg tablet Oral  Magnesium TABS Oral  MSM 1000 MG Oral Tablet Oral  Multi-Vitamin TABS Oral  Nature-Throid 180 mg tablet Oral  Nature-Throid 146.25 mg tablet Oral  Pepsin Powder Does Not Apply  Potassium Chloride Powder Does Not Apply  Prednisone  Quercetin TABS Oral  Resveratrol 100 MG Oral Capsule Oral  Silica CAPS Oral  Strontium Chloride Crystals Does Not Apply  Vanadium CAPS Oral  Vitamin D CAPS Oral  Vitamin D3 CAPS Oral  Vitamin E CAPS Oral  Vitamin K TABS Oral    GU PSH: Catheterize For Residual - 03/23/2016 Cysto Dilate Stricture (M or F) - 03/30/2016, 11/14/2014, 2014, 2013, 2012 Cystoscopy - 03/29/2016     PSH Notes: Cystoscopy For Urethral Stricture, Cystoscopy For Urethral Stricture, Cystoscopy For Urethral Stricture, Hernia Repair, Urethra Surgery, Cystoscopy For Urethral Stricture   NON-GU PSH: Hernia Repair - 2013   GU PMH: Incomplete bladder emptying - 03/23/2016 Urethral Stricture, Unspec, Urethral stricture - 11/19/2014 Chronic cystitis (w/o hematuria), Chronic cystitis - 2016 ED due to arterial insufficiency, Erectile dysfunction due to arterial insufficiency - 2016 History of prostate cancer, Prostate Cancer - 2014 Urinary Retention, Unspec, Incomplete bladder emptying - 2014 Urinary Tract Inf, Unspec site, Urinary tract infection - 2014     PMH Notes:  2012-05-07 14:57:00 - Note: Laryngeal Cancer   NON-GU PMH: Encounter for general adult  medical examination without abnormal findings, Encounter for preventive health examination - 11/10/2014 Personal history of other diseases of the nervous system and sense organs, History of sleep apnea - 2014 Personal history of other mental and behavioral disorders, History of depression - 2014   FAMILY HISTORY: Class I Angina - Father, Sister Death In The Family Father - Father Death In The Family Mother - Mother Family Health Status Number - Runs In Family    SOCIAL HISTORY: None    Notes: Marital History - Currently Married, Occupation: Retired, Caffeine Use, Never A Smoker, Alcohol Use, Tobacco Use   REVIEW OF SYSTEMS:    GU Review Male:   Patient denies frequent urination, hard to postpone urination, burning/ pain with urination, get up at night to urinate, leakage of urine, stream starts and stops, trouble starting your stream, have to strain to urinate , erection problems, and penile pain.  Gastrointestinal (Upper):   Patient denies nausea, vomiting, and indigestion/ heartburn.  Gastrointestinal (Lower):   Patient denies diarrhea and constipation.  Constitutional:   Patient denies fever, night sweats, weight loss, and fatigue.  Skin:   Patient denies skin rash/ lesion and itching.  Eyes:   Patient denies blurred vision and double vision.  Ears/ Nose/ Throat:   Patient denies sore throat and sinus problems.  Hematologic/Lymphatic:   Patient denies swollen glands and easy bruising.  Cardiovascular:   Patient denies chest pains and leg swelling.  Respiratory:   Patient denies cough and shortness of breath.  Endocrine:   Patient denies excessive thirst.  Musculoskeletal:   Patient denies back pain and joint pain.  Neurological:   Patient denies headaches and dizziness.  Psychologic:   Patient denies depression and anxiety.   VITAL SIGNS:      09/07/2016 09:27 AM  Weight 162 lb / 73.48 kg  Height 71 in / 180.34 cm  BP 174/95 mmHg  Pulse 67 /min  Temperature 97.9 F / 36.6 C  BMI 22.6 kg/m   PAST DATA REVIEWED:  Source Of History:  Patient   PROCEDURES: None   ASSESSMENT:      ICD-10 Details  1 GU:   Incomplete bladder emptying - R39.14   2   Urethral Stricture, Unspec - N35.9      PLAN:          Schedule Return Visit/Planned Activity: ASAP - Schedule Surgery   After a thorough review of the management options for the patient's condition the patient  elected to proceed with surgical therapy as noted above. We have discussed  the potential benefits and risks of the procedure, side effects of the proposed treatment, the likelihood of the patient achieving the goals of the procedure, and any potential problems that might occur during the procedure or recuperation. Informed consent has been obtained.

## 2016-09-13 ENCOUNTER — Ambulatory Visit (HOSPITAL_BASED_OUTPATIENT_CLINIC_OR_DEPARTMENT_OTHER)
Admission: RE | Admit: 2016-09-13 | Discharge: 2016-09-13 | Disposition: A | Discharge status: Home / Self Care | Payer: Medicare Other | Source: Ambulatory Visit | Attending: Urology | Admitting: Urology

## 2016-09-13 ENCOUNTER — Encounter (HOSPITAL_BASED_OUTPATIENT_CLINIC_OR_DEPARTMENT_OTHER): Payer: Self-pay | Admitting: *Deleted

## 2016-09-13 ENCOUNTER — Ambulatory Visit (HOSPITAL_BASED_OUTPATIENT_CLINIC_OR_DEPARTMENT_OTHER): Payer: Medicare Other | Admitting: Certified Registered"

## 2016-09-13 ENCOUNTER — Encounter (HOSPITAL_BASED_OUTPATIENT_CLINIC_OR_DEPARTMENT_OTHER)
Admission: RE | Disposition: A | Discharge status: Home / Self Care | Payer: Self-pay | Source: Ambulatory Visit | Attending: Urology

## 2016-09-13 DIAGNOSIS — E039 Hypothyroidism, unspecified: Secondary | ICD-10-CM | POA: Insufficient documentation

## 2016-09-13 DIAGNOSIS — G4730 Sleep apnea, unspecified: Secondary | ICD-10-CM | POA: Insufficient documentation

## 2016-09-13 DIAGNOSIS — Z888 Allergy status to other drugs, medicaments and biological substances status: Secondary | ICD-10-CM | POA: Diagnosis not present

## 2016-09-13 DIAGNOSIS — N320 Bladder-neck obstruction: Secondary | ICD-10-CM | POA: Insufficient documentation

## 2016-09-13 DIAGNOSIS — K219 Gastro-esophageal reflux disease without esophagitis: Secondary | ICD-10-CM | POA: Diagnosis not present

## 2016-09-13 DIAGNOSIS — N359 Urethral stricture, unspecified: Secondary | ICD-10-CM | POA: Diagnosis not present

## 2016-09-13 DIAGNOSIS — Z79899 Other long term (current) drug therapy: Secondary | ICD-10-CM | POA: Diagnosis not present

## 2016-09-13 DIAGNOSIS — I10 Essential (primary) hypertension: Secondary | ICD-10-CM | POA: Diagnosis not present

## 2016-09-13 DIAGNOSIS — Z8546 Personal history of malignant neoplasm of prostate: Secondary | ICD-10-CM | POA: Diagnosis not present

## 2016-09-13 DIAGNOSIS — C099 Malignant neoplasm of tonsil, unspecified: Secondary | ICD-10-CM | POA: Diagnosis not present

## 2016-09-13 DIAGNOSIS — M1990 Unspecified osteoarthritis, unspecified site: Secondary | ICD-10-CM | POA: Insufficient documentation

## 2016-09-13 DIAGNOSIS — Z8249 Family history of ischemic heart disease and other diseases of the circulatory system: Secondary | ICD-10-CM | POA: Insufficient documentation

## 2016-09-13 HISTORY — PX: CYSTOSCOPY WITH URETHRAL DILATATION: SHX5125

## 2016-09-13 LAB — POCT I-STAT, CHEM 8
BUN: 27 mg/dL — ABNORMAL HIGH (ref 6–20)
CALCIUM ION: 1.21 mmol/L (ref 1.15–1.40)
Chloride: 104 mmol/L (ref 101–111)
Creatinine, Ser: 1.7 mg/dL — ABNORMAL HIGH (ref 0.61–1.24)
GLUCOSE: 95 mg/dL (ref 65–99)
HCT: 36 % — ABNORMAL LOW (ref 39.0–52.0)
HEMOGLOBIN: 12.2 g/dL — AB (ref 13.0–17.0)
POTASSIUM: 3.9 mmol/L (ref 3.5–5.1)
Sodium: 143 mmol/L (ref 135–145)
TCO2: 27 mmol/L (ref 22–32)

## 2016-09-13 SURGERY — CYSTOSCOPY, WITH URETHRAL DILATION
Anesthesia: Monitor Anesthesia Care

## 2016-09-13 MED ORDER — MEPERIDINE HCL 25 MG/ML IJ SOLN
6.2500 mg | INTRAMUSCULAR | Status: DC | PRN
  Filled 2016-09-13: qty 1

## 2016-09-13 MED ORDER — HYDROCODONE-ACETAMINOPHEN 5-325 MG PO TABS: 1.0000 | ORAL_TABLET | Freq: Four times a day (QID) | ORAL | 0 refills | Status: DC | PRN

## 2016-09-13 MED ORDER — ONDANSETRON HCL 4 MG/2ML IJ SOLN
INTRAMUSCULAR | Status: DC | PRN
  Administered 2016-09-13: 4 mg via INTRAVENOUS

## 2016-09-13 MED ORDER — PROPOFOL 500 MG/50ML IV EMUL
INTRAVENOUS | Status: DC | PRN
  Administered 2016-09-13: 150 ug/kg/min via INTRAVENOUS

## 2016-09-13 MED ORDER — ACETAMINOPHEN 160 MG/5ML PO SOLN
325.0000 mg | ORAL | Status: DC | PRN
  Filled 2016-09-13: qty 20.3

## 2016-09-13 MED ORDER — CIPROFLOXACIN IN D5W 400 MG/200ML IV SOLN
400.0000 mg | INTRAVENOUS | Status: AC
  Administered 2016-09-13: 400 mg via INTRAVENOUS
  Filled 2016-09-13: qty 200

## 2016-09-13 MED ORDER — OXYCODONE HCL 5 MG PO TABS
5.0000 mg | ORAL_TABLET | Freq: Once | ORAL | Status: DC | PRN
  Filled 2016-09-13: qty 1

## 2016-09-13 MED ORDER — PROPOFOL 10 MG/ML IV BOLUS
INTRAVENOUS | Status: DC | PRN
  Administered 2016-09-13: 30 mg via INTRAVENOUS

## 2016-09-13 MED ORDER — CIPROFLOXACIN HCL 250 MG PO TABS: 250.0000 mg | ORAL_TABLET | Freq: Two times a day (BID) | ORAL | 0 refills | Status: DC

## 2016-09-13 MED ORDER — CIPROFLOXACIN IN D5W 400 MG/200ML IV SOLN
INTRAVENOUS | Status: AC
  Filled 2016-09-13: qty 200

## 2016-09-13 MED ORDER — ONDANSETRON HCL 4 MG/2ML IJ SOLN
4.0000 mg | Freq: Once | INTRAMUSCULAR | Status: DC | PRN
  Filled 2016-09-13: qty 2

## 2016-09-13 MED ORDER — FENTANYL CITRATE (PF) 100 MCG/2ML IJ SOLN
INTRAMUSCULAR | Status: DC | PRN
  Administered 2016-09-13: 50 ug via INTRAVENOUS

## 2016-09-13 MED ORDER — OXYCODONE HCL 5 MG/5ML PO SOLN
5.0000 mg | Freq: Once | ORAL | Status: DC | PRN
  Filled 2016-09-13: qty 5

## 2016-09-13 MED ORDER — FENTANYL CITRATE (PF) 100 MCG/2ML IJ SOLN
INTRAMUSCULAR | Status: AC
  Filled 2016-09-13: qty 2

## 2016-09-13 MED ORDER — FENTANYL CITRATE (PF) 100 MCG/2ML IJ SOLN
25.0000 ug | INTRAMUSCULAR | Status: DC | PRN
  Filled 2016-09-13: qty 1

## 2016-09-13 MED ORDER — WHITE PETROLATUM GEL
Status: AC
  Filled 2016-09-13: qty 5

## 2016-09-13 MED ORDER — ACETAMINOPHEN 325 MG PO TABS
325.0000 mg | ORAL_TABLET | ORAL | Status: DC | PRN
  Filled 2016-09-13: qty 2

## 2016-09-13 MED ORDER — SODIUM CHLORIDE 0.9 % IV SOLN
INTRAVENOUS | Status: DC
  Administered 2016-09-13: 12:00:00 via INTRAVENOUS
  Filled 2016-09-13: qty 1000

## 2016-09-13 SURGICAL SUPPLY — 22 items
BAG DRAIN URO-CYSTO SKYTR STRL (DRAIN) ×2 IMPLANT
BALLN NEPHROSTOMY (BALLOONS) ×2
BALLOON NEPHROSTOMY (BALLOONS) ×1 IMPLANT
CATH FOLEY 2W COUNCIL 5CC 16FR (CATHETERS) ×2 IMPLANT
CATH ROBINSON RED A/P 14FR (CATHETERS) IMPLANT
CATH URET 5FR 28IN CONE TIP (BALLOONS)
CATH URET 5FR 70CM CONE TIP (BALLOONS) IMPLANT
CLOTH BEACON ORANGE TIMEOUT ST (SAFETY) ×4 IMPLANT
GLOVE BIO SURGEON STRL SZ7.5 (GLOVE) ×2 IMPLANT
GOWN STRL REUS W/ TWL LRG LVL3 (GOWN DISPOSABLE) ×1 IMPLANT
GOWN STRL REUS W/ TWL XL LVL3 (GOWN DISPOSABLE) ×1 IMPLANT
GOWN STRL REUS W/TWL LRG LVL3 (GOWN DISPOSABLE) ×1
GOWN STRL REUS W/TWL XL LVL3 (GOWN DISPOSABLE) ×1
GUIDEWIRE STR DUAL SENSOR (WIRE) ×2 IMPLANT
IV NS IRRIG 3000ML ARTHROMATIC (IV SOLUTION) ×4 IMPLANT
KIT RM TURNOVER CYSTO AR (KITS) ×2 IMPLANT
MANIFOLD NEPTUNE II (INSTRUMENTS) IMPLANT
NS IRRIG 500ML POUR BTL (IV SOLUTION) IMPLANT
PACK CYSTO (CUSTOM PROCEDURE TRAY) ×2 IMPLANT
SYRINGE IRR TOOMEY STRL 70CC (SYRINGE) IMPLANT
TUBE CONNECTING 12X1/4 (SUCTIONS) IMPLANT
WATER STERILE IRR 3000ML UROMA (IV SOLUTION) IMPLANT

## 2016-09-13 NOTE — Interval H&P Note (Signed)
History and Physical Interval Note:  09/13/2016 12:07 PM  Philip [name]  has presented today for surgery, with the diagnosis of bladder neck contractor  The various methods of treatment have been discussed with the patient and family. After consideration of risks, benefits and other options for treatment, the patient has consented to  Procedure(s): CYSTOSCOPY WITH URETHRAL BALLOON DILATATION (N/A) as a surgical intervention .  The patient's history has been reviewed, patient examined, no change in status, stable for surgery.  I have reviewed the patient's chart and labs.  Questions were answered to the patient's satisfaction.     [name]

## 2016-09-13 NOTE — Anesthesia Preprocedure Evaluation (Signed)
Anesthesia Evaluation  Patient identified by MRN, date of birth, ID band Patient awake    Reviewed: Allergy & Precautions, NPO status , Patient's Chart, lab work & pertinent test results  Airway Mallampati: II  TM Distance: >3 FB Neck ROM: Full    Dental  (+) Dental Advisory Given, Partial Lower, Upper Dentures,    Pulmonary sleep apnea ,    Pulmonary exam normal breath sounds clear to auscultation       Cardiovascular hypertension, Pt. on medications negative cardio ROS Normal cardiovascular exam Rhythm:Regular Rate:Normal     Neuro/Psych    GI/Hepatic GERD  Medicated and Controlled,  Endo/Other  Hypothyroidism   Renal/GU    S/p Prostate CA  Pt self caths     Musculoskeletal  (+) Arthritis , Osteoarthritis,    Abdominal Normal abdominal exam  (+)   Peds  Hematology   Anesthesia Other Findings   Reproductive/Obstetrics                             Anesthesia Physical  Anesthesia Plan  ASA: III  Anesthesia Plan: MAC   Post-op Pain Management:    Induction: Intravenous  PONV Risk Score and Plan: 1 and Ondansetron, Dexamethasone and Treatment may vary due to age or medical condition  Airway Management Planned: Nasal Cannula  Additional Equipment:   Intra-op Plan:   Post-operative Plan:   Informed Consent: I have reviewed the patients History and Physical, chart, labs and discussed the procedure including the risks, benefits and alternatives for the proposed anesthesia with the patient or authorized representative who has indicated his/her understanding and acceptance.   Dental advisory given  Plan Discussed with: CRNA and Surgeon  Anesthesia Plan Comments:         Anesthesia Quick Evaluation

## 2016-09-13 NOTE — Discharge Instructions (Signed)
I have reviewed discharge instructions in detail with the patient. They will follow-up with me or their physician as scheduled. My nurse will also be calling the patients as per protocol.     Post Anesthesia Home Care Instructions  Activity: Get plenty of rest for the remainder of the day. A responsible individual must stay with you for 24 hours following the procedure.  For the next 24 hours, DO NOT: -Drive a car -Operate machinery -Drink alcoholic beverages -Take any medication unless instructed by your physician -Make any legal decisions or sign important papers.  Meals: Start with liquid foods such as gelatin or soup. Progress to regular foods as tolerated. Avoid greasy, spicy, heavy foods. If nausea and/or vomiting occur, drink only clear liquids until the nausea and/or vomiting subsides. Call your physician if vomiting continues.  Special Instructions/Symptoms: Your throat may feel dry or sore from the anesthesia or the breathing tube placed in your throat during surgery. If this causes discomfort, gargle with warm salt water. The discomfort should disappear within 24 hours.  If you had a scopolamine patch placed behind your ear for the management of post- operative nausea and/or vomiting:  1. The medication in the patch is effective for 72 hours, after which it should be removed.  Wrap patch in a tissue and discard in the trash. Wash hands thoroughly with soap and water. 2. You may remove the patch earlier than 72 hours if you experience unpleasant side effects which may include dry mouth, dizziness or visual disturbances. 3. Avoid touching the patch. Wash your hands with soap and water after contact with the patch.     

## 2016-09-13 NOTE — Anesthesia Postprocedure Evaluation (Signed)
Anesthesia Post Note  Patient: Philip [name]  Procedure(s) Performed: Procedure(s) (LRB): CYSTOSCOPY WITH URETHRAL BALLOON DILATATION (N/A)     Patient location during evaluation: PACU Anesthesia Type: MAC Level of consciousness: awake and alert Pain management: pain level controlled Vital Signs Assessment: post-procedure vital signs reviewed and stable Respiratory status: spontaneous breathing, nonlabored ventilation and respiratory function stable Cardiovascular status: stable and blood pressure returned to baseline Anesthetic complications: no    Last Vitals:  Vitals:   09/13/16 1359 09/13/16 1400  BP: 124/64   Pulse: (!) 44 (!) 43  Resp: 10 (!) 9  Temp:    SpO2: 100% 100%    Last Pain:  Vitals:   09/13/16 1330  TempSrc:   PainSc: 0-No pain                 Lynda Rainwater

## 2016-09-13 NOTE — Op Note (Signed)
Operative diagnosis: urethral stricture and bladder neck contracture Postoperative diagnosis urethral stricture and bladder neck contracture Surgery: Cystoscopy and balloon dilation of stricture and bladder neck contracture and insertion of Foley catheter Surgeon: Dr. Nicki Reaper Ruberta Holck  The patient has the above diagnoses and consented to the above procedure. Utilizing the 17 French cystoscope the penile bulbar urethra normal. 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 I could see the lumen upstream buttock could not negotiate the scope through it but I could last time  Under fluoroscopic and cystoscopic guidance I passed a sensor wire curling into the bladder. A well-prepared balloon dilation catheter was passed into the bladder bridging the narrow area. I balloon dilated for 5 minutes at 18 atm of pressure and 24 [place]. I deflated the balloon and removed it  I cystoscoped along the sensor wire entering the bladder. Compared to the last findings he once again had an elevated bladder neck that was wide open. When I would pull the scope back he had bilobar enlargement of the prostate with rigidity. At 6 and 7:00 he had a large false passage more distally. Then I could see the strictured area described above dilated to about 27 [place] with no injury.  A 16 French [place]cill catheter was inserted over the wire with minimal resistance.  Urine was clear. Procedure was well-tolerated under IV sedation. The patient will be followed as per protocol. A more hopeful that this will help his catheterization protocol and it would be very reasonable to do it again in 6 months based upon the natural history of the disease and today's findings

## 2016-09-13 NOTE — Transfer of Care (Signed)
Immediate Anesthesia Transfer of Care Note  Patient: Philip [name]  Procedure(s) Performed: Procedure(s): CYSTOSCOPY WITH URETHRAL BALLOON DILATATION (N/A)  Patient Location: PACU  Anesthesia Type:MAC  Level of Consciousness: awake, alert , oriented and patient cooperative  Airway & Oxygen Therapy: Patient Spontanous Breathing and Patient connected to nasal cannula oxygen  Post-op Assessment: Report given to RN and Post -op Vital signs reviewed and stable  Post vital signs: Reviewed and stable  Last Vitals:  Vitals:   09/13/16 1124  BP: (!) 159/90  Pulse: 63  Resp: 16  Temp: 36.6 C  SpO2: 97%    Last Pain:  Vitals:   09/13/16 1124  TempSrc: Oral      Patients Stated Pain Goal: 5 (33/[id number])  Complications: No apparent anesthesia complications

## 2016-09-13 NOTE — Progress Notes (Signed)
Pt instructed to remove foley catheter on Monday, and he is familiar and comfortable with changing to and from leg bag and standard drainage bags. Denies questions about instructions.

## 2016-09-14 ENCOUNTER — Encounter (HOSPITAL_BASED_OUTPATIENT_CLINIC_OR_DEPARTMENT_OTHER): Payer: Self-pay | Admitting: Urology

## 2016-09-14 DIAGNOSIS — N183 Chronic kidney disease, stage 3 (moderate): Secondary | ICD-10-CM | POA: Diagnosis not present

## 2016-09-20 ENCOUNTER — Ambulatory Visit: Payer: Medicare Other | Admitting: Neurology

## 2016-09-23 ENCOUNTER — Telehealth: Payer: Self-pay | Admitting: *Deleted

## 2016-09-23 NOTE — Telephone Encounter (Signed)
Patient called and left message on Voicemail regarding wanting a referral to Rob at [facility] for Vertigo.   I tried calling patient back and it just rings. Was going to make patient an appointment with Dr. Mariea Clonts first at [facility] for Vertigo.

## 2016-09-23 NOTE — Telephone Encounter (Signed)
Wife called back and stated pt is looking to have the Epley Maneuver done to help relieve vertigo, pt had this done years ago at [facility]. I advised pt should first make appt with Dr. Mariea Clonts to be accessed first. Wife agreed and scheduled appt for next week.

## 2016-09-28 ENCOUNTER — Encounter: Payer: Self-pay | Admitting: Internal Medicine

## 2016-09-28 ENCOUNTER — Non-Acute Institutional Stay: Payer: Medicare Other | Admitting: Internal Medicine

## 2016-09-28 VITALS — BP 128/80 | HR 67 | Temp 98.1°F | Wt 165.0 lb

## 2016-09-28 DIAGNOSIS — H8112 Benign paroxysmal vertigo, left ear: Secondary | ICD-10-CM

## 2016-09-28 DIAGNOSIS — M542 Cervicalgia: Secondary | ICD-10-CM | POA: Diagnosis not present

## 2016-09-28 DIAGNOSIS — R42 Dizziness and giddiness: Secondary | ICD-10-CM

## 2016-09-28 NOTE — Progress Notes (Signed)
Location:  [place] of Service:  Clinic (12)  Provider: Valery Chance L. Mariea Clonts, D.O., C.M.D.  Code Status: DNR Goals of Care:  Advanced Directives 09/28/2016  Does Patient Have a Medical Advance Directive? Yes  Type of Paramedic of [place];Out of facility DNR (pink MOST or yellow form)  Does patient want to make changes to medical advance directive? -  Copy of [place] in Chart? Yes  Would patient like information on creating a medical advance directive? -  Pre-existing out of facility DNR order (yellow form or pink MOST form) Yellow form placed in chart (order not valid for inpatient use)     Chief Complaint  Patient presents with  . Acute Visit    vertigo for over a year " very slight" per patient    HPI: Patient is a 83 y.o. male seen today for an acute visit for self-diagnosed slight vertigo requesting PT Epley maneuver.   He feels it all of the time (a slight fuzziness).  He has an excellent sense of balance.  He's been doing the [facility] balance class, but he continues to feel it.  He has the finals coming up for table tennis in ten 10 days and feels it then.  Rod Holler from the [facility] program asked if he had any neck problem.  He does yoga stretching. He's had some neckache.  It feels like a cramp that comes for no reason.  He's waiting for it to go away, but it has not.  He feels worse when he gets out of bed as far as the wobbliness.  Then it is faint through the rest of the day.  He does not notice the vertigo when he's in his bed.  He thinks he missed the gold b/c of his medium level vertigo.  He still played and says he was useless.  He does get double vision looking out the side to the right.  He also is reaching beneath the ball with his forehand. Even in front of him, he misses at times.  Last saw ophtho 6 mos ago and had symptoms after that moreso.  Saw neurologist for double vision in [place] last year and muscles of eye were out of  sync.  He normally does not notice the double vision unless he looks extreme left or right. Has had faint tinnitus for a long time, but not changed--in both ears and he does not notice it anymore.  Had one fall where he dislocated his right shoulder.    Past Medical History:  Diagnosis Date  . Aneurysm of left renal artery (HCC)    per CT 08-20-2015 stable 10mm  . 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 Bladder neck contracture   . BPH (benign prostatic hyperplasia)   . CKD (chronic kidney disease), stage III   . DDD (degenerative disc disease)   . Full dentures   . GERD (gastroesophageal reflux disease)   . History of prostate cancer DX  2002   S/P EXTERNAL RADIATION/ RADIOACTIVE SEED IMPLANTS  2003  . History of squamous cell carcinoma excision    2012--  RIGHT LOWER EXTREM  . HTN (hypertension)   . Hyperlipemia   . Hypothyroidism   . Large vessel vasculitis (HCC)    aortitis--- rheumotologist-  dr Charlestine Night  . Left renal artery stenosis (HCC)    proximal per CT 08-20-2015 but patent  . Left renal atrophy   .  Macular degeneration   . Nocturia 08/08/2011  . OSA (obstructive sleep apnea) intolerant cpap  . Renal cyst, right   . Restless legs syndrome (RLS) 10/31/2011  . S/P radiation therapy ended 01-26-2012    for tonsillar cancer (head and neck) /  and external beam radiation for prostate cancer 2002  . Saliva decreased   . Self-catheterizes urinary bladder    QID   AND   PRN  . 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 Urethral stricture    chronic  . Urinary retention with incomplete bladder emptying   . Vitamin B 12 deficiency  01/28/2011  . Vitamin D deficiency     Past Surgical History:  Procedure Laterality Date  . BALLOON DILATION N/A 11/10/2014   Procedure: CYSTO BALLOON DILATION AND RETROGRADE URETHROGRAM ;  Surgeon: Bjorn Loser, MD;  Location: [facility];  Service: Urology;  Laterality: N/A;  . CATARACT EXTRACTION W/ INTRAOCULAR LENS  IMPLANT, BILATERAL    . CYSTO/ BALLOON DILATION OF  URETHRAL STRICTURE  12-25-2010  . CYSTOSCOPY WITH RETROGRADE URETHROGRAM N/A 03/30/2016   Procedure: CYSTOSCOPY WITH RETROGRADE URETHROGRAM AND BALLOON DILATION with cystogram;  Surgeon: Bjorn Loser, MD;  Location: [facility];  Service: Urology;  Laterality: N/A;  . CYSTOSCOPY WITH URETHRAL DILATATION  05/31/2011   Procedure: CYSTOSCOPY WITH URETHRAL DILATATION;  Surgeon: Reece Packer, MD;  Location: [facility];  Service: Urology;  Laterality: N/A;  BALLOON DILATION  . CYSTOSCOPY WITH URETHRAL DILATATION N/A 09/13/2016   Procedure: CYSTOSCOPY WITH URETHRAL BALLOON DILATATION;  Surgeon: Bjorn Loser, MD;  Location: [facility];  Service: Urology;  Laterality: N/A;  . CYSTOSCOPY/RETROGRADE/URETEROSCOPY N/A 05/22/2012   Procedure: CYSTOSCOPY BALLOON DILATION RETROGRADE URETEROGRAM ;  Surgeon: Reece Packer, MD;  Location: [facility];  Service: Urology;  Laterality: N/A;  . CYTSO/  DILATATION URETHRAL STRICTURE/  BX PROSTATIC URETHRA/  REMOVAL FOREIGN BODIES  06-25-2010   [facility]  . INGUINAL HERNIA REPAIR Right 2000  . RADIOACTIVE SEED IMPLANTS, PROSTATE  JAN  2003  . SKIN LESION EXCISION  07/2012   MOST  right shoulder    Allergies  Allergen Reactions  . Cephalosporins Rash  . Suprax [Cefixime] Rash    Allergies as of 09/28/2016      Reactions   Cephalosporins Rash   Suprax [cefixime] Rash      Medication List       Accurate as of 09/28/16  9:13 AM. Always use your most recent med list.          ASHWAGANDHA PO Take by  mouth daily.   buPROPion 100 MG tablet Commonly known as:  WELLBUTRIN TAKE ONE TABLET BY MOUTH TWICE DAILY   CALCIUM PO Take 800 mg by mouth daily.   COLLAGEN PO Take 600 mg by mouth 2 (two) times daily.   CoQ10 100 MG Caps Take 100 mg by mouth daily. Take one daily   cyanocobalamin 100 MCG tablet Take 100 mcg by mouth daily.   DIGESTIVE ENZYMES PO Take 500 mg by mouth every morning.   fish oil-omega-3 fatty acids 1000 MG capsule Take 2 g by mouth daily.   GLUCOSAMINE-CHONDROITIN-MSM PO Take 500 mg by mouth 2 (  two) times daily.   HYALURONIC ACID PO Take by mouth 2 (two) times daily.   lisinopril 10 MG tablet Commonly known as:  PRINIVIL,ZESTRIL Take 10 mg by mouth 2 (two) times a week.   multivitamins ther. w/minerals Tabs tablet Take 2 tablets by mouth 2 (two) times daily.   NATURAL VITAMIN E 400 UNIT capsule Generic drug:  vitamin E Take 400 Units by mouth daily.   NP THYROID 90 MG tablet Generic drug:  thyroid Take 90 mg by mouth every morning.   PREDNISONE PO Take 7.5 mg by mouth daily.   RESVERATROL PO Take 100 mg by mouth 2 (two) times daily.   TURMERIC CURCUMIN PO Take 650 mg by mouth every morning.   vitamin C 1000 MG tablet Take 1,000 mg by mouth daily.   Vitamin D 2000 units Caps Take by mouth daily.   VITAMIN K2 PO Take by mouth daily.       Review of Systems:  Review of Systems  Constitutional: Negative for chills and fever.  Eyes: Positive for blurred vision and double vision.  Respiratory: Negative for shortness of breath.   Cardiovascular: Negative for chest pain.  Musculoskeletal: Positive for falls.  Neurological: Positive for dizziness. Negative for loss of consciousness.    Health Maintenance  Topic Date Due  . TETANUS/TDAP  01/17/2013  . INFLUENZA VACCINE  08/17/2016  . PNA vac Low Risk Adult  Completed    Physical Exam: Vitals:   09/28/16 0901  BP: 128/80  Pulse: 67  Temp: 98.1 F (36.7 C)  TempSrc: Oral    SpO2: 96%  Weight: 165 lb (74.8 kg)   Body mass index is 23.01 kg/m. Physical Exam  Constitutional: He is oriented to person, place, and time. He appears well-developed and well-nourished. No distress.  HENT:  Head: Normocephalic and atraumatic.  Eyes: Pupils are equal, round, and reactive to light. EOM are normal.  Neck: Normal range of motion. Neck supple.  Cardiovascular: Normal rate, regular rhythm, normal heart sounds and intact distal pulses.   Pulmonary/Chest: Effort normal and breath sounds normal. No respiratory distress.  Neurological: He is alert and oriented to person, place, and time.  Nystagmus to the left with EOM testing  Skin: Skin is warm and dry.  Psychiatric: He has a normal mood and affect.    Labs reviewed: Basic Metabolic Panel:  Recent Labs  02/12/16 03/30/16 0801 09/13/16 1215  NA 143 140 143  K 4.3 4.4 3.9  CL  --  103 104  GLUCOSE  --  94 95  BUN 23* 26* 27*  CREATININE 1.6* 1.40* 1.70*   Liver Function Tests:  Recent Labs  02/12/16  AST 20  ALT 17  ALKPHOS 67   No results for input(s): LIPASE, AMYLASE in the last 8760 hours. No results for input(s): AMMONIA in the last 8760 hours. CBC:  Recent Labs  02/12/16 03/30/16 0801 09/13/16 1215  WBC 4.9  --   --   HGB 14.9 13.3 12.2*  HCT 42 39.0 36.0*  PLT 182  --   --    Assessment/Plan 1. Vertigo -seems like it's very mild if he truly has vertigo -also has difficulty with diplopia from weak EOMs per ophthalmology -requests PT due to his upcoming table tennis tournament  2. Benign paroxysmal positional nystagmus, left -noted on exam  3. Neck pain, musculoskeletal -seems to have some arthritis and gets occasional cramps of neck that resolve on their own--did not concern me  Labs/tests ordered:  PT  Next appt:  Call to schedule regular appt  Emya Picado L. Amariz Flamenco, D.O. [facility] [address], [place] [address] Cell Phone  (Mon-Fri 8am-5pm):  [phone] On Call:  [phone] & follow prompts after 5pm & weekends Office Phone:  [phone] Office Fax:  [phone]

## 2016-09-30 DIAGNOSIS — I10 Essential (primary) hypertension: Secondary | ICD-10-CM | POA: Diagnosis not present

## 2016-09-30 DIAGNOSIS — R278 Other lack of coordination: Secondary | ICD-10-CM | POA: Diagnosis not present

## 2016-09-30 DIAGNOSIS — R42 Dizziness and giddiness: Secondary | ICD-10-CM | POA: Diagnosis not present

## 2016-10-10 ENCOUNTER — Encounter: Payer: Self-pay | Admitting: Neurology

## 2016-10-10 ENCOUNTER — Ambulatory Visit (INDEPENDENT_AMBULATORY_CARE_PROVIDER_SITE_OTHER): Payer: Medicare Other | Admitting: Neurology

## 2016-10-10 VITALS — BP 167/91 | HR 60 | Ht 71.0 in | Wt 163.5 lb

## 2016-10-10 DIAGNOSIS — G4733 Obstructive sleep apnea (adult) (pediatric): Secondary | ICD-10-CM

## 2016-10-10 DIAGNOSIS — G478 Other sleep disorders: Secondary | ICD-10-CM | POA: Diagnosis not present

## 2016-10-10 DIAGNOSIS — G4761 Periodic limb movement disorder: Secondary | ICD-10-CM | POA: Diagnosis not present

## 2016-10-10 DIAGNOSIS — G2581 Restless legs syndrome: Secondary | ICD-10-CM

## 2016-10-10 NOTE — Progress Notes (Signed)
Subjective:    Patient ID: Philip [name] is a 83 y.o. male.  HPI     Interim history:   Philip [name] is an 83 year old right-handed gentleman with an underlying complex medical history of prostate cancer, status post seed implants, hypertension, tonsillar cancer, status post XRT, restless leg syndrome, vitamin D deficiency, vitamin B12 deficiency, hyperlipidemia, degenerative disc disease, squamous cell cancer, hypothyroidism, reflux disease, macular degeneration, who presents for follow-up consultation of his sleep disturbance including prior diagnosis of OSA. The patient is unaccompanied today. I first met him on 05/12/2015 at the request of his primary care physician, at which time he reported a prior diagnosis of obstructive sleep apnea. He was complaining about nonrestorative sleep and not be able to sleep through the night. He struggled with CPAP and could not tolerate it, apparently also tried BiPAP. He was invited for a sleep study but did not schedule it at the time. He returns for reevaluation.   Today, 10/10/2016: He reports waking up in the middle of the night, no major trouble falling asleep, but staying asleep. He has intermittent RLS symptoms, but not nightly. He has tried OTC magnesium and melatonin. WT is 7-7:30, naps, but not daily. BT is around 10 PM. Tries to hydrate and stay active. Has been taking his BP med more regularly. Also has had intermittent Vertigo.    The patient's allergies, current medications, family history, past medical history, past social history, past surgical history and problem list were reviewed and updated as appropriate.   Previously (copied from previous notes for reference):   05/12/2015: (He) was previously diagnosed with obstructive sleep apnea. I reviewed his CPAP titration study from 02/25/2008, interpreted by Dr. Gwenette Greet. Sleep efficiency was 89.4%, sleep latency was 13.5 minutes, REM latency was 102 minutes. CPAP was titrated from 4 cm to 11 cm.  PLM index was 69.7/h.  He had a baseline sleep study on 01/27/2008. This was interpreted by Dr. Mickeal Skinner and I reviewed the results. AHI was 18.6 per hour. Average oxygen saturation was 89%, nadir was 85%. PLM index was 24.6 per hour. Sleep efficiency was 76.6%, sleep latency 7 minutes, REM latency 162.5 minutes.    His main complaint about his sleep is nonrestorative sleep and not being able to sleep through the night with at times multiple nighttime awakenings. He does not have significant difficulty falling asleep. Originally, in 2010 when he had a sleep study evaluation he was having issues with anxiety and his wife had noted pauses in his breathing at night. He struggled with the CPAP for about a year. He tried different masks and even was tried on BiPAP as I understand for about a week and could not tolerate it either. He was about 165 pounds at the time of his sleep study testing, then lost a lot of weight, and gain some back, maintaining now between higher 150s and lower 160s typically. He has no family history of OSA or restless leg syndrome. His leg twitching and kicking can be quite vehement at night and is almost nightly per wife. He takes magnesium over-the-counter for this but not every night and feels it has helped some but would be willing to try a dopamine agonists and did some research on Requip.   He is a retired Insurance underwriter and worked for [employer] for about 30 years and has also been in [employer]. He spent some time in [place] and neuropathy. He has 4 grown children. He lives with his wife. He has 5  grandchildren. He drinks alcohol in the form of wine, 1-2 glasses per night, he is a never smoker and does not use any illicit drugs. Caffeine is in the form of coffee, usually 2 cups per day in the morning. He does not drink sodas.   He does not take any sleep aids. He likes to take over-the-counter supplements or natural medications rather than prescription medications he admits.  He has his radiology oncology follow-up every 6 months at [facility] for his tonsillar cancer.   He self catheterizes, does not have to catheterize in the middle of the night. He denies morning headaches. His Epworth sleepiness score is 3 out of 24 today, his fatigue score is 31 out of 63. He does not typically take a nap. His bedtime is around 10 PM, rise time is around 7:30 AM, he feels marginally or adequately rested on most mornings. He has occasional leg cramps.  His Past Medical History Is Significant For: Past Medical History:  Diagnosis Date  . Aneurysm of left renal artery (HCC)    per CT 08-20-2015 stable 72m  . [place]itis [place]  dr tCharlestine Night  IgG4 syndrome-- treatment prednisone- (effects abdomine)  . Bladder neck contracture   . BPH (benign prostatic hyperplasia)   . CKD (chronic kidney disease), stage III   . DDD (degenerative disc disease)   . Full dentures   . GERD (gastroesophageal reflux disease)   . History of prostate cancer DX  2002   S/P EXTERNAL RADIATION/ RADIOACTIVE SEED IMPLANTS  2003  . History of squamous cell carcinoma excision    2012--  RIGHT LOWER EXTREM  . HTN (hypertension)   . Hyperlipemia   . Hypothyroidism   . Large vessel vasculitis (HCC)    aortitis--- rheumotologist-  dr tCharlestine Night . Left renal artery stenosis (HCC)    proximal per CT 08-20-2015 but patent  . Left renal atrophy   . Macular degeneration   . Nocturia 08/08/2011  . OSA (obstructive sleep apnea) intolerant cpap  . Renal cyst, right   . Restless legs syndrome (RLS) 10/31/2011  . S/P radiation therapy ended 01-26-2012    for tonsillar cancer (head and neck) /  and external beam radiation for prostate cancer 2002  . Saliva decreased   . Self-catheterizes urinary bladder    QID   AND   PRN  . Tonsillar cancer ([facility] 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+) (Stage IVA)---- dx oct 2013  ----s/p concurent chemo and radiation/  ended  01-26-2012-- no surgical intervention---  residuals ( dry mouth, decreased saliva)   [place]  [place]--  HX OF -- NO RECURRENCE  . Urethral stricture    chronic  . Urinary retention with incomplete bladder emptying   . Vitamin B 12 deficiency 01/28/2011  . Vitamin D deficiency     His Past Surgical History Is Significant For: Past Surgical History:  Procedure Laterality Date  . BALLOON DILATION N/A 11/10/2014   Procedure: CYSTO BALLOON DILATION AND RETROGRADE URETHROGRAM ;  Surgeon: SBjorn Loser MD;  [facility]  Service: Urology;  Laterality: N/A;  . CATARACT EXTRACTION W/ INTRAOCULAR LENS  IMPLANT, BILATERAL    . CYSTO/ BALLOON DILATION OF  URETHRAL STRICTURE  12-25-2010  . CYSTOSCOPY WITH RETROGRADE URETHROGRAM N/A 03/30/2016   Procedure: CYSTOSCOPY WITH RETROGRADE URETHROGRAM AND BALLOON DILATION with cystogram;  Surgeon: SBjorn Loser MD;  [facility]  Service: Urology;  Laterality: N/A;  .  CYSTOSCOPY WITH URETHRAL DILATATION  05/31/2011   Procedure: CYSTOSCOPY WITH URETHRAL DILATATION;  Surgeon: Reece Packer, MD;  Location: [place];  Service: Urology;  Laterality: N/A;  BALLOON DILATION  . CYSTOSCOPY WITH URETHRAL DILATATION N/A 09/13/2016   Procedure: CYSTOSCOPY WITH URETHRAL BALLOON DILATATION;  Surgeon: Bjorn Loser, MD;  Location: [place];  Service: Urology;  Laterality: N/A;  . CYSTOSCOPY/RETROGRADE/URETEROSCOPY N/A 05/22/2012   Procedure: CYSTOSCOPY BALLOON DILATION RETROGRADE URETEROGRAM ;  Surgeon: Reece Packer, MD;  Location: [place];  Service: Urology;  Laterality: N/A;  . CYTSO/  DILATATION URETHRAL STRICTURE/  BX PROSTATIC URETHRA/  REMOVAL FOREIGN BODIES  06-25-2010   [facility]  . INGUINAL HERNIA REPAIR Right 2000  . RADIOACTIVE SEED IMPLANTS, PROSTATE  JAN  2003  . SKIN LESION EXCISION  07/2012   MOST  right shoulder    His Family  History Is Significant For: Family History  Problem Relation Age of Onset  . Stroke Father     His Social History Is Significant For: Social History   Social History  . Marital status: Married    Spouse name: N/A  . Number of children: 4  . Years of education: college   Occupational History  . retired    Social History Main Topics  . Smoking status: Never Smoker  . Smokeless tobacco: Never Used  . Alcohol use 4.2 oz/week    7 Glasses of wine per week     Comment: 8oz, glass or red wine  . Drug use: No  . Sexual activity: Not Asked   Other Topics Concern  . 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     His Allergies Are:  Allergies  Allergen Reactions  . Cephalosporins Rash  . Suprax [Cefixime] Rash  :   His Current Medications Are:  Outpatient Encounter Prescriptions as of 10/10/2016  Medication Sig  . Ascorbic Acid (VITAMIN C) 1000 MG tablet Take 1,000 mg by mouth daily.  . ASHWAGANDHA PO Take by mouth daily.  Marland Kitchen buPROPion (WELLBUTRIN) 100 MG tablet TAKE ONE TABLET BY MOUTH TWICE DAILY  . CALCIUM PO Take 800 mg by mouth daily.   . Cholecalciferol (VITAMIN D) 2000 units CAPS Take by mouth daily.  . Coenzyme Q10 (COQ10) 100 MG CAPS Take 100 mg by mouth daily. Take one daily  . COLLAGEN PO Take 600 mg by mouth 2 (two) times daily.   . cyanocobalamin 100 MCG tablet Take 100 mcg by mouth daily.  Marland Kitchen DIGESTIVE ENZYMES PO Take 500 mg by mouth every morning.   . fish oil-omega-3 fatty acids 1000 MG capsule Take 2 g by mouth daily.  Marland Kitchen GLUCOSAMINE-CHONDROITIN-MSM PO Take 500 mg by mouth 2 (two) times daily.   Marland Kitchen Hyaluronic Acid-Vitamin C (HYALURONIC ACID PO) Take by mouth 2 (two) times daily.   Marland Kitchen lisinopril (PRINIVIL,ZESTRIL) 10 MG tablet Take 10 mg by mouth 2 (two) times a week.  . Menaquinone-7 (VITAMIN K2 PO) Take by mouth daily.  . Multiple Vitamins-Minerals  (MULTIVITAMINS THER. W/MINERALS) TABS Take 2 tablets by mouth 2 (two) times daily.   . NP THYROID 90 MG tablet Take 90 mg by mouth every morning.   Marland Kitchen PREDNISONE PO Take 7.5 mg by mouth daily.  Marland Kitchen RESVERATROL PO Take 100 mg by mouth 2 (two) times daily.   . TURMERIC CURCUMIN PO Take 650  mg by mouth every morning.  . vitamin E (NATURAL VITAMIN E) 400 UNIT capsule Take 400 Units by mouth daily.   No facility-administered encounter medications on file as of 10/10/2016.   :  Review of Systems:  Out of a complete 14 point review of systems, all are reviewed and negative with the exception of these symptoms as listed below: Review of Systems  Neurological:       Pt states that he has been doing fine.     Objective:  Neurological Exam  Physical Exam Physical Examination:   Vitals:   10/10/16 0936  BP: (!) 167/91  Pulse: 60   General Examination: The patient is a very pleasant 84 y.o. male in no acute distress. He appears well-developed and well-nourished and well groomed.   HEENT: Normocephalic, atraumatic, pupils are equal, round and reactive to light and accommodation. Corrective eye glasses. Extraocular tracking is good without limitation to gaze excursion or nystagmus noted. Normal smooth pursuit is noted. Hearing is grossly intact. Face is symmetric with normal facial animation and normal facial sensation. Speech is clear with no dysarthria noted. There is no hypophonia. There is no lip, neck/head, jaw or voice tremor. Neck is supple with full range of passive and active motion. Oropharynx exam reveals: moderate mouth dryness, adequate dental hygiene with partial plate in place and mild airway crowding, due to redundant soft palate and smaller airway opening. Mallampati is class III. Tongue protrudes centrally and palate elevates symmetrically. Tonsils are small/absent. Neck size is 15 1/8 inches. He has a Mild overbite.   Chest: Clear to auscultation without wheezing, rhonchi or crackles  noted.  Heart: S1+S2+0, regular and normal without murmurs, rubs or gallops noted.   Abdomen: Soft, non-tender and non-distended with normal bowel sounds appreciated on auscultation.  Extremities: There is trace pitting edema in the distal lower extremities bilaterally, around both ankles, L more than R.   Skin: Warm and dry without trophic changes noted. There are no varicose veins.  Musculoskeletal: exam reveals no obvious joint deformities, tenderness or joint swelling or erythema.   Neurologically:  Mental status: The patient is awake, alert and oriented in all 4 spheres. His immediate and remote memory, attention, language skills and fund of knowledge are appropriate. There is no evidence of aphasia, agnosia, apraxia or anomia. Speech is clear with normal prosody and enunciation. Thought process is linear. Mood is normal and affect is normal.  Cranial nerves II - XII are as described above under HEENT exam. In addition: shoulder shrug is normal with equal shoulder height noted. Motor exam: Normal bulk, strength and tone is noted. There is no drift, tremor or rebound. Romberg is negative. Reflexes are 1. Fine motor skills and coordination: intact with normal finger taps, normal hand movements, normal rapid alternating patting, normal foot taps and normal foot agility.  Cerebellar testing: No dysmetria or intention tremor on finger to nose testing. There is no truncal or gait ataxia.  Sensory exam: intact to light touch in the upper and lower extremities.  Gait, station and balance: He stands easily. No veering to one side is noted. No leaning to one side is noted. Posture is age-appropriate and stance is narrow based. Gait shows normal stride length and normal pace. No problems turning are noted. Tandem walk is difficult for him.   Assessment and Plan:  In summary, Philip [name] is a very pleasant 83 year old male with an underlying complex medical history of prostate cancer,  status post seed implants, hypertension,  tonsillar cancer, status post XRT, restless leg syndrome, vitamin D deficiency, vitamin B12 deficiency, hyperlipidemia, degenerative disc disease, squamous cell cancer, hypothyroidism, reflux disease, macular degeneration, who presents for re-evaluation of his sleep disturbance which primarily consists of sleep maintenance difficulties. He has a prior history of obstructive sleep apnea and also intermittent symptoms of restless leg syndrome which he believes is not a big player at this time. He has a history of leg movements in his sleep. He tried CPAP in the past but could not tolerate it at the time. I suggested we proceed with reevaluation with a sleep study to further delineate his underlying sleep difficulties a little better and consider treatment for his restless leg syndrome/PLMS and/or obstructive sleep apnea. He is in agreement. To that end I ordered a sleep study and we will schedule at his convenience, once we have insurance approval. I answered all his questions today and he was in agreement.  I spent 25 minutes in total face-to-face time with the patient, more than 50% of which was spent in counseling and coordination of care, reviewing test results, reviewing medication and discussing or reviewing the diagnosis of PLMD/RLS/OSA., the prognosis and treatment options. Pertinent laboratory and imaging test results that were available during this visit with the patient were reviewed by me and considered in my medical decision making (see chart for details).

## 2016-10-10 NOTE — Patient Instructions (Signed)
Based on your symptoms and your exam I believe we should look for an underlying organic sleep disorder, such as obstructive sleep apnea/OSA, or dream enactments, or periodic leg movement disorder (PLMD), which is leg twitching in sleep. While this is typically associated with symptoms of restless leg syndrome, patients can have periodic leg movements, that is, repetitive twitching in their sleep secondary to medication effects, particularly from taking an SSRI type antidepressant. Sometimes changing the antidepressant regimen can help. If this leg movement disorder disrupts your sleep, it can cause other problems such as daytime sleepiness. Please do not drive if you feel sleepy. Sometimes other conditions can cause leg twitching at night including thyroid dysfunction and iron deficiency. There are some symptomatic medications available for treatment.   At this point, we should proceed with a sleep study to further delineate your sleep-related issues.   If you have more than mild OSA, I want you to consider treatment with CPAP. Please remember, the risks and ramifications of moderate to severe obstructive sleep apnea or OSA are: Cardiovascular disease, including congestive heart failure, stroke, difficult to control hypertension, arrhythmias, and even type 2 diabetes has been linked to untreated OSA. Sleep apnea causes disruption of sleep and sleep deprivation in most cases, which, in turn, can cause recurrent headaches, problems with memory, mood, concentration, focus, and vigilance. Most people with untreated sleep apnea report excessive daytime sleepiness, which can affect their ability to drive. Please do not drive if you feel sleepy.   I will see you back after your sleep study to go over the test results and where to go from there. We will call you after your sleep study to advise about the results (most likely, you will hear from Kristen, my nurse) and to set up an appointment at the time.    Our  sleep lab administrative assistant, Dawn will call you to schedule your sleep study. If you don't hear back from her by about 2 weeks from now, please feel free to call her at [phone]. This is her direct line and please leave a message with your phone number to call back if you get the voicemail box. She will call back as soon as possible.     

## 2016-10-12 DIAGNOSIS — R3914 Feeling of incomplete bladder emptying: Secondary | ICD-10-CM | POA: Diagnosis not present

## 2016-10-12 DIAGNOSIS — N359 Urethral stricture, unspecified: Secondary | ICD-10-CM | POA: Diagnosis not present

## 2016-10-14 ENCOUNTER — Other Ambulatory Visit: Payer: Self-pay | Admitting: Internal Medicine

## 2016-10-14 NOTE — Telephone Encounter (Signed)
Patient requested a refill on lisinopril. Rx was sent to pharmacy electronically.

## 2016-10-17 DIAGNOSIS — R42 Dizziness and giddiness: Secondary | ICD-10-CM | POA: Diagnosis not present

## 2016-10-17 DIAGNOSIS — I10 Essential (primary) hypertension: Secondary | ICD-10-CM | POA: Diagnosis not present

## 2016-10-17 DIAGNOSIS — R278 Other lack of coordination: Secondary | ICD-10-CM | POA: Diagnosis not present

## 2016-10-18 DIAGNOSIS — H532 Diplopia: Secondary | ICD-10-CM | POA: Diagnosis not present

## 2016-10-18 DIAGNOSIS — H353132 Nonexudative age-related macular degeneration, bilateral, intermediate dry stage: Secondary | ICD-10-CM | POA: Diagnosis not present

## 2016-10-18 DIAGNOSIS — H524 Presbyopia: Secondary | ICD-10-CM | POA: Diagnosis not present

## 2016-10-18 DIAGNOSIS — H43813 Vitreous degeneration, bilateral: Secondary | ICD-10-CM | POA: Diagnosis not present

## 2016-10-26 ENCOUNTER — Other Ambulatory Visit: Payer: Self-pay | Admitting: Internal Medicine

## 2016-11-13 ENCOUNTER — Ambulatory Visit (INDEPENDENT_AMBULATORY_CARE_PROVIDER_SITE_OTHER): Payer: Medicare Other | Admitting: Neurology

## 2016-11-13 DIAGNOSIS — G2581 Restless legs syndrome: Secondary | ICD-10-CM

## 2016-11-13 DIAGNOSIS — G4761 Periodic limb movement disorder: Secondary | ICD-10-CM

## 2016-11-13 DIAGNOSIS — G4733 Obstructive sleep apnea (adult) (pediatric): Secondary | ICD-10-CM

## 2016-11-13 DIAGNOSIS — G4720 Circadian rhythm sleep disorder, unspecified type: Secondary | ICD-10-CM

## 2016-11-13 DIAGNOSIS — G478 Other sleep disorders: Secondary | ICD-10-CM

## 2016-11-14 DIAGNOSIS — E039 Hypothyroidism, unspecified: Secondary | ICD-10-CM | POA: Diagnosis not present

## 2016-11-15 ENCOUNTER — Telehealth: Payer: Self-pay

## 2016-11-15 NOTE — Telephone Encounter (Signed)

## 2016-11-15 NOTE — Telephone Encounter (Signed)
-----   Message from Star Age, MD sent at 11/15/2016  8:07 AM EDT ----- Patient last seen by me on 10/10/16 for re-eval for OSA, diagnostic PSG on 11/13/16.    Please call and notify the patient that the recent sleep study did confirm the diagnosis of moderate to severe obstructive sleep apnea, with a total AHI of 16.4/hour, REM AHI of 39.4/hour, supine AHI of 34.2/hour and O2 nadir of 66%. I recommend treatment for this in the form of CPAP. This will require a repeat sleep study for proper titration and mask fitting. Please explain to patient and arrange for a CPAP titration study. I have placed an order in the chart. Thanks, and please route to [facility] for scheduling next sleep study.  Star Age, MD, PhD [facility])

## 2016-11-15 NOTE — Procedures (Signed)
PATIENT'S NAME:  Philip [name], Philip [name] DOB:      01-20-1932      MR#:    [id number]     DATE OF RECORDING: 11/13/2016 REFERRING M.D.:  Hollace Kinnier, DO Study Performed:   Baseline Polysomnogram HISTORY: 83 year old man with a history of prostate cancer, status post seed implants, hypertension, tonsillar cancer, status post XRT, restless leg syndrome, vitamin D deficiency, vitamin B12 deficiency, hyperlipidemia, degenerative disc disease, squamous cell cancer, hypothyroidism, reflux disease, macular degeneration, who was previously diagnosed with OSA, but could not tolerate CPAP at the time. He returns for reevaluation. The patient endorsed the Epworth Sleepiness Scale at 3 points. The patient's weight 163 pounds with a height of 71 (inches), resulting in a BMI of 22.8 kg/m2. The patient's neck circumference measured 15 inches.  CURRENT MEDICATIONS: Vitamin C, Ashwagandha, Wellbutrin, Calcium, Vitamin D3, CoQ10, Collagen, Colostrum, Cyanocobalamin, Digestive Enzymes, Glocosamine-Chondroitin, Hyaluronic acid-Vit C, Lisinopril, Methylcobalamin, Multivitamins, Resveratrol, Nature-Throid.    PROCEDURE:  This is a multichannel digital polysomnogram utilizing the Somnostar 11.2 system.  Electrodes and sensors were applied and monitored per AASM Specifications.   EEG, EOG, Chin and Limb EMG, were sampled at 200 Hz.  ECG, Snore and Nasal Pressure, Thermal Airflow, Respiratory Effort, CPAP Flow and Pressure, Oximetry was sampled at 50 Hz. Digital video and audio were recorded.      BASELINE STUDY  Lights Out was at 22:14 and Lights On at 05:34.  Total recording time (TRT) was 441 minutes, with a total sleep time (TST) of  296.5 minutes.   The patient's sleep latency was 50 minutes, which is delayed. REM latency was 159 minutes, which is delayed. The sleep efficiency was 67.2%.     SLEEP ARCHITECTURE: WASO (Wake after sleep onset) was 94.5 minutes with one longer period of wakefulness. There were 6.5 minutes in Stage  N1, 227.5 minutes Stage N2, 0 minutes Stage N3 and 62.5 minutes in Stage REM.  The percentage of Stage N1 was 2.2%, Stage N2 was 76.7%, which is increased, Stage N3 was absent, and Stage R (REM sleep) was 21.1%, which is normal. The arousals were noted as: 12 were spontaneous, 4 were associated with PLMs, 80 were associated with respiratory events.    Audio and video analysis did not show any abnormal or unusual movements, behaviors, phonations or vocalizations. The patient took no bathroom breaks. Mild snoring was noted. The EKG was in keeping with normal sinus rhythm (NSR).  RESPIRATORY ANALYSIS:  There were a total of 81 respiratory events:  47 obstructive apneas, 0 central apneas and 0 mixed apneas with a total of 47 apneas and an apnea index (AI) of 9.5 /hour. There were 34 hypopneas with a hypopnea index of 6.9 /hour. The patient also had 0 respiratory event related arousals (RERAs).      The total APNEA/HYPOPNEA INDEX (AHI) was 16.4/hour and the total RESPIRATORY DISTURBANCE INDEX was 16.4 /hour.  41 events occurred in REM sleep and 58 events in NREM. The REM AHI was 39.4 /hour, versus a non-REM AHI of 10.3. The patient spent 137 minutes of total sleep time in the supine position and 160 minutes in non-supine.. The supine AHI was 34.2 versus a non-supine AHI of 1.1.  OXYGEN SATURATION & C02:  The Wake baseline 02 saturation was 96%, with the lowest being 66%. Time spent below 89% saturation equaled 15 minutes.  PERIODIC LIMB MOVEMENTS: The patient had a total of 67 Periodic Limb Movements.  The Periodic Limb Movement (PLM) index was 13.6 and the PLM  Arousal index was .8/hour.  Post-study, the patient indicated that sleep was the same as usual.   IMPRESSION: 1. Obstructive Sleep Apnea (OSA) 2. Dysfunctions associated with sleep stages or arousal from sleep  RECOMMENDATIONS: 1. This study demonstrates moderate to severe obstructive sleep apnea, with a total AHI of 16.4/hour, REM AHI of  39.4/hour, supine AHI of 34.2/hour and O2 nadir of 66%. Treatment with positive airway pressure in the form of CPAP is recommended. This will require a full night titration study to optimize therapy. Other treatment options may include avoidance of supine sleep position along with weight loss, upper airway or jaw surgery in selected patients or the use of an oral appliance in certain patients. ENT evaluation and/or consultation with a maxillofacial surgeon or dentist may be feasible in some instances.    2. Please note that untreated obstructive sleep apnea carries additional perioperative morbidity. Patients with significant obstructive sleep apnea should receive perioperative PAP therapy and the surgeons and particularly the anesthesiologist should be informed of the diagnosis and the severity of the sleep disordered breathing. 3. This study shows sleep fragmentation and abnormal sleep stage percentages; these are nonspecific findings and per se do not signify an intrinsic sleep disorder or a cause for the patient's sleep-related symptoms. Causes include (but are not limited to) the first night effect of the sleep study, circadian rhythm disturbances, medication effect or an underlying mood disorder or medical problem.  4. The patient should be cautioned not to drive, work at heights, or operate dangerous or heavy equipment when tired or sleepy. Review and reiteration of good sleep hygiene measures should be pursued with any patient. 5. Borderline PLMs (periodic limb movements of sleep) were noted during this study with no significant arousals; clinical correlation is recommended. 6. The patient will be seen in follow-up by Dr. Rexene Alberts at [facility] for discussion of the test results and further management strategies. The referring provider will be notified of the test results.  I certify that I have reviewed the entire raw data recording prior to the issuance of this report in accordance with the Standards of  Accreditation of the American Academy of Sleep Medicine (AASM)   Star Age, MD, PhD Diplomat, American Board of Psychiatry and Neurology (Neurology and Sleep Medicine)

## 2016-11-15 NOTE — Addendum Note (Signed)
Addended by: Star Age on: 11/15/2016 08:07 AM   Modules accepted: Orders

## 2016-11-15 NOTE — Progress Notes (Signed)
Patient last seen by me on 10/10/16 for re-eval for OSA, diagnostic PSG on 11/13/16.    Please call and notify the patient that the recent sleep study did confirm the diagnosis of moderate to severe obstructive sleep apnea, with a total AHI of 16.4/hour, REM AHI of 39.4/hour, supine AHI of 34.2/hour and O2 nadir of 66%. I recommend treatment for this in the form of CPAP. This will require a repeat sleep study for proper titration and mask fitting. Please explain to patient and arrange for a CPAP titration study. I have placed an order in the chart. Thanks, and please route to [facility] for scheduling next sleep study.  Star Age, MD, PhD [facility])

## 2016-11-24 DIAGNOSIS — R5383 Other fatigue: Secondary | ICD-10-CM | POA: Diagnosis not present

## 2016-11-24 DIAGNOSIS — M25571 Pain in right ankle and joints of right foot: Secondary | ICD-10-CM | POA: Diagnosis not present

## 2016-11-24 DIAGNOSIS — N189 Chronic kidney disease, unspecified: Secondary | ICD-10-CM | POA: Diagnosis not present

## 2016-11-24 DIAGNOSIS — M25579 Pain in unspecified ankle and joints of unspecified foot: Secondary | ICD-10-CM | POA: Diagnosis not present

## 2016-11-24 DIAGNOSIS — M79604 Pain in right leg: Secondary | ICD-10-CM | POA: Diagnosis not present

## 2016-11-24 DIAGNOSIS — M79605 Pain in left leg: Secondary | ICD-10-CM | POA: Diagnosis not present

## 2016-11-24 DIAGNOSIS — Z7952 Long term (current) use of systemic steroids: Secondary | ICD-10-CM | POA: Diagnosis not present

## 2016-11-24 DIAGNOSIS — M25572 Pain in left ankle and joints of left foot: Secondary | ICD-10-CM | POA: Diagnosis not present

## 2016-11-24 DIAGNOSIS — M19071 Primary osteoarthritis, right ankle and foot: Secondary | ICD-10-CM | POA: Diagnosis not present

## 2016-11-24 DIAGNOSIS — M79606 Pain in leg, unspecified: Secondary | ICD-10-CM | POA: Diagnosis not present

## 2016-11-24 DIAGNOSIS — M19072 Primary osteoarthritis, left ankle and foot: Secondary | ICD-10-CM | POA: Diagnosis not present

## 2016-11-24 DIAGNOSIS — I776 Arteritis, unspecified: Secondary | ICD-10-CM | POA: Diagnosis not present

## 2016-11-30 DIAGNOSIS — H353132 Nonexudative age-related macular degeneration, bilateral, intermediate dry stage: Secondary | ICD-10-CM | POA: Diagnosis not present

## 2016-11-30 DIAGNOSIS — H43813 Vitreous degeneration, bilateral: Secondary | ICD-10-CM | POA: Diagnosis not present

## 2016-11-30 DIAGNOSIS — D3132 Benign neoplasm of left choroid: Secondary | ICD-10-CM | POA: Diagnosis not present

## 2016-12-19 ENCOUNTER — Ambulatory Visit (INDEPENDENT_AMBULATORY_CARE_PROVIDER_SITE_OTHER): Payer: Medicare Other | Admitting: Neurology

## 2016-12-19 DIAGNOSIS — G4761 Periodic limb movement disorder: Secondary | ICD-10-CM

## 2016-12-19 DIAGNOSIS — G4720 Circadian rhythm sleep disorder, unspecified type: Secondary | ICD-10-CM

## 2016-12-19 DIAGNOSIS — G2581 Restless legs syndrome: Secondary | ICD-10-CM

## 2016-12-19 DIAGNOSIS — G4733 Obstructive sleep apnea (adult) (pediatric): Secondary | ICD-10-CM | POA: Diagnosis not present

## 2016-12-19 DIAGNOSIS — G478 Other sleep disorders: Secondary | ICD-10-CM

## 2016-12-21 ENCOUNTER — Other Ambulatory Visit: Payer: Self-pay | Admitting: Neurology

## 2016-12-21 ENCOUNTER — Telehealth: Payer: Self-pay

## 2016-12-21 DIAGNOSIS — Z23 Encounter for immunization: Secondary | ICD-10-CM | POA: Diagnosis not present

## 2016-12-21 DIAGNOSIS — D485 Neoplasm of uncertain behavior of skin: Secondary | ICD-10-CM | POA: Diagnosis not present

## 2016-12-21 DIAGNOSIS — D0471 Carcinoma in situ of skin of right lower limb, including hip: Secondary | ICD-10-CM | POA: Diagnosis not present

## 2016-12-21 DIAGNOSIS — G4733 Obstructive sleep apnea (adult) (pediatric): Secondary | ICD-10-CM

## 2016-12-21 DIAGNOSIS — C44729 Squamous cell carcinoma of skin of left lower limb, including hip: Secondary | ICD-10-CM | POA: Diagnosis not present

## 2016-12-21 NOTE — Telephone Encounter (Signed)
-----   Message from Star Age, MD sent at 12/21/2016  8:01 AM EST ----- Patient last seen by me on 10/10/16 for re-eval for OSA, diagnostic PSG on 11/13/16, CPAP study on 12/19/16.     Please call and inform patient that I have entered an order for treatment with positive airway pressure (PAP) treatment of obstructive sleep apnea (OSA). He did well during the latest sleep study with CPAP. We will, therefore, arrange for a machine for home use through a DME (durable medical equipment) company of His choice; and I will see the patient back in follow-up in about 10 weeks. Please also explain to the patient that I will be looking out for compliance data, which can be downloaded from the machine (stored on an SD card, that is inserted in the machine) or via remote access through a modem, that is built into the machine. At the time of the followup appointment we will discuss sleep study results and how it is going with PAP treatment at home. Please advise patient to bring His machine at the time of the first FU visit, even though this is cumbersome. Bringing the machine for every visit after that will likely not be needed, but often helps for the first visit to troubleshoot if needed. Please re-enforce the importance of compliance with treatment and the need for [place] to monitor compliance data - often an insurance requirement and actually good feedback for the patient as far as how they are doing.  Also remind patient, that any interim PAP machine or mask issues should be first addressed with the DME company, as they can often help better with technical and mask fit issues. Please ask if patient has a preference regarding DME company.  Please also make sure, the patient has a follow-up appointment with me in about 10 weeks from the setup date, thanks.  Once you have spoken to the patient - and faxed/routed report to PCP and referring MD (if other than PCP), you can close this encounter, thanks,   Star Age, MD,  PhD [facility] ([place])

## 2016-12-21 NOTE — Telephone Encounter (Signed)
I called pt to discuss his sleep study results. No answer, left a message asking him to call me back. 

## 2016-12-21 NOTE — Progress Notes (Signed)
Patient last seen by me on 10/10/16 for re-eval for OSA, diagnostic PSG on 11/13/16, CPAP study on 12/19/16.     Please call and inform patient that I have entered an order for treatment with positive airway pressure (PAP) treatment of obstructive sleep apnea (OSA). He did well during the latest sleep study with CPAP. We will, therefore, arrange for a machine for home use through a DME (durable medical equipment) company of His choice; and I will see the patient back in follow-up in about 10 weeks. Please also explain to the patient that I will be looking out for compliance data, which can be downloaded from the machine (stored on an SD card, that is inserted in the machine) or via remote access through a modem, that is built into the machine. At the time of the followup appointment we will discuss sleep study results and how it is going with PAP treatment at home. Please advise patient to bring His machine at the time of the first FU visit, even though this is cumbersome. Bringing the machine for every visit after that will likely not be needed, but often helps for the first visit to troubleshoot if needed. Please re-enforce the importance of compliance with treatment and the need for [place] to monitor compliance data - often an insurance requirement and actually good feedback for the patient as far as how they are doing.  Also remind patient, that any interim PAP machine or mask issues should be first addressed with the DME company, as they can often help better with technical and mask fit issues. Please ask if patient has a preference regarding DME company.  Please also make sure, the patient has a follow-up appointment with me in about 10 weeks from the setup date, thanks.  Once you have spoken to the patient - and faxed/routed report to PCP and referring MD (if other than PCP), you can close this encounter, thanks,   Star Age, MD, PhD [facility] ([place])

## 2016-12-21 NOTE — Procedures (Signed)
PATIENT'S NAME:  Philip [name], Philip [name] DOB:      [birth year]      MR#:    [id number]     DATE OF RECORDING: 12/19/2016 REFERRING M.D.:  Hollace Kinnier, DO Study Performed:   CPAP  Titration HISTORY: 83 year old man with a medical history of prostate cancer, status post seed implants, hypertension, tonsillar cancer, status post XRT, restless leg syndrome, vitamin D deficiency, vitamin B12 deficiency, hyperlipidemia, degenerative disc disease, squamous cell cancer, hypothyroidism, reflux disease, macular degeneration, who presents for full night titration study. His baseline PSG on 11/13/16 showed a total AHI of 16.4/hour, REM AHI of 39.4/hour, supine AHI of 34.2/hour and O2 nadir of 66%.   CURRENT MEDICATIONS: Vitamin C, Ashwagandha, Wellbutrin, Calcium, Vitamin D3, CoQ10, Collagen, Colostrum, Cyanocobalamin, Digestive Enzymes, Glocosamine-Chondroitin, Hyaluronic acid-Vit C, Lisinopril, Methylcobalamin, Multivitamins, Resveratrol, Nature- Thyroid.   PROCEDURE:  This is a multichannel digital polysomnogram utilizing the SomnoStar 11.2 system.  Electrodes and sensors were applied and monitored per AASM Specifications.   EEG, EOG, Chin and Limb EMG, were sampled at 200 Hz.  ECG, Snore and Nasal Pressure, Thermal Airflow, Respiratory Effort, CPAP Flow and Pressure, Oximetry was sampled at 50 Hz. Digital video and audio were recorded.      The patient was fitted with medium nasal pillows. CPAP was initiated at 5 cmH20 with heated humidity per AASM standards and pressure was advanced to 10 cmH20 because of hypopneas, apneas and desaturations.  At a PAP pressure of 10 cmH20, there was a reduction of the AHI to 0/hour with supine NREM sleep achieved and O2 nadir of 91%.    Lights Out was at 22:41 and Lights On at 05:35. Total recording time (TRT) was 413.5 minutes, with a total sleep time (TST) of 357.5 minutes. The patient's sleep latency was 15.5 minutes, which is normal. REM latency was 178 minutes, which is delayed.  The sleep efficiency was 86.5%.    SLEEP ARCHITECTURE: WASO (Wake after sleep onset) was 40 minutes with mild to moderate sleep fragmentation noted. There were 15.5 minutes in Stage N1, 304 minutes Stage N2, 0 minutes Stage N3 and 38 minutes in Stage REM.  The percentage of Stage N1 was 4.3%, Stage N2 was 85.%, which is markedly increased, Stage N3 was absent, and Stage R (REM sleep) was 10.6%, which is reduced. The arousals were noted as: 11 were spontaneous, 101 were associated with PLMs, 8 were associated with respiratory events.  Audio and video analysis did not show any abnormal or unusual movements, behaviors, phonations or vocalizations. The patient took no bathroom breaks. The EKG was in keeping with normal sinus rhythm (NSR).  RESPIRATORY ANALYSIS:  There was a total of 9 respiratory events: 0 obstructive apneas, 0 central apneas and 0 mixed apneas with a total of 0 apneas and an apnea index (AI) of 0 /hour. There were 9 hypopneas with a hypopnea index of 1.5/hour. The patient also had 0 respiratory event related arousals (RERAs).      The total APNEA/HYPOPNEA INDEX  (AHI) was 1.5 /hour and the total RESPIRATORY DISTURBANCE INDEX was 1.5 .hour  1 events occurred in REM sleep and 8 events in NREM. The REM AHI was 1.6 /hour versus a non-REM AHI of 1.5 /hour.  The patient spent 191 minutes of total sleep time in the supine position and 167 minutes in non-supine. The supine AHI was 2.8, versus a non-supine AHI of 0.0.  OXYGEN SATURATION & C02:  The baseline 02 saturation was 94%, with the lowest being 90%. Time  spent below 89% saturation equaled 0 minutes.  PERIODIC LIMB MOVEMENTS: The patient had a total of 589 Periodic Limb Movements. The Periodic Limb Movement (PLM) index was 98.9 and the PLM Arousal index was 17. /hour. Post-study, the patient indicated that sleep was better than usual.   DIAGNOSIS 1. Obstructive Sleep Apnea (OSA) 2. Dysfunctions associated with sleep stages or arousal from  sleep 3. Periodic Limb Movement Disorder    RECOMMENDATIONS: 1. This study demonstrates resolution of the patient's obstructive sleep apnea with CPAP therapy. I will, therefore, start the patient on home CPAP treatment at a pressure of 10 cm via medium nasal pillows with heated humidity. The patient should be reminded to be fully compliant with PAP therapy to improve sleep related symptoms and decrease long term cardiovascular risks. The patient should be reminded, that it may take up to 3 months to get fully used to using PAP with all planned sleep. The earlier full compliance is achieved, the better long term compliance tends to be. Please note that untreated obstructive sleep apnea carries additional perioperative morbidity. Patients with significant obstructive sleep apnea should receive perioperative PAP therapy and the surgeons and particularly the anesthesiologist should be informed of the diagnosis and the severity of the sleep disordered breathing. 2. Severe PLMs (periodic limb movements of sleep) were noted during this study with moderate arousals; clinical correlation is recommended. Medication effect from the patient's antidepressant medication should be considered.  3. This study shows sleep fragmentation and abnormal sleep stage percentages; these are nonspecific findings and per se do not signify an intrinsic sleep disorder or a cause for the patient's sleep-related symptoms. Causes include (but are not limited to) the first night effect of the sleep study, circadian rhythm disturbances, medication effect or an underlying mood disorder or medical problem.  4. The patient should be cautioned not to drive, work at heights, or operate dangerous or heavy equipment when tired or sleepy. Review and reiteration of good sleep hygiene measures should be pursued with any patient. 5. The patient will be seen in follow-up by Dr. Rexene Alberts at [facility] for discussion of the test results and further management  strategies. The referring provider will be notified of the test results.   I certify that I have reviewed the entire raw data recording prior to the issuance of this report in accordance with the Standards of Accreditation of the American Academy of Sleep Medicine (AASM)     Star Age, MD, PhD Diplomat, American Board of Psychiatry and Neurology (Neurology and Sleep Medicine)

## 2016-12-21 NOTE — Telephone Encounter (Signed)
I called pt. I advised pt that Dr. Rexene Alberts reviewed their sleep study results and found that pt did well during the latest sleep study with the cpap. Dr. Rexene Alberts recommends that pt start a cpap at home. I reviewed PAP compliance expectations with the pt. Pt is agreeable to starting a CPAP. I advised pt that an order will be sent to a DME, [facility], and [facility] will call the pt within about one week after they file with the pt's insurance. [facility] will show the pt how to use the machine, fit for masks, and troubleshoot the CPAP if needed. A follow up appt was made for insurance purposes with Dr. Rexene Alberts on 03/08/17 at 2:00pm. Pt verbalized understanding to arrive 15 minutes early and bring their CPAP. A letter with all of this information in it will be mailed to the pt as a reminder. I verified with the pt that the address we have on file is correct. Pt verbalized understanding of results. Pt had no questions at this time but was encouraged to call back if questions arise.

## 2016-12-21 NOTE — Progress Notes (Signed)
cpap order

## 2016-12-29 ENCOUNTER — Encounter: Payer: Self-pay | Admitting: Internal Medicine

## 2016-12-29 ENCOUNTER — Ambulatory Visit (INDEPENDENT_AMBULATORY_CARE_PROVIDER_SITE_OTHER): Payer: Medicare Other | Admitting: Internal Medicine

## 2016-12-29 VITALS — BP 168/100 | HR 64 | Temp 97.5°F | Wt 162.0 lb

## 2016-12-29 DIAGNOSIS — I10 Essential (primary) hypertension: Secondary | ICD-10-CM | POA: Diagnosis not present

## 2016-12-29 DIAGNOSIS — M79661 Pain in right lower leg: Secondary | ICD-10-CM

## 2016-12-29 DIAGNOSIS — B351 Tinea unguium: Secondary | ICD-10-CM | POA: Diagnosis not present

## 2016-12-29 DIAGNOSIS — M316 Other giant cell arteritis: Secondary | ICD-10-CM | POA: Diagnosis not present

## 2016-12-29 NOTE — Progress Notes (Signed)
Location:  [facility] clinic Provider: Virdie Penning L. Mariea Clonts, D.O., C.M.D.  Code Status: DNR Goals of Care:  Advanced Directives 09/28/2016  Does Patient Have a Medical Advance Directive? Yes  Type of Paramedic of [place];Out of facility DNR (pink MOST or yellow form)  Does patient want to make changes to medical advance directive? -  Copy of [place] in Chart? Yes  Would patient like information on creating a medical advance directive? -  Pre-existing out of facility DNR order (yellow form or pink MOST form) Yellow form placed in chart (order not valid for inpatient use)   Chief Complaint  Patient presents with  . Acute Visit    right foot pain x1 mth    HPI: Patient is a 83 y.o. male seen today for an acute visit for right foot and lower leg pain x one month.    Right top of foot hurts more when he's at rest.  It woke him one night.  Feels fine when doing jumping jacks and exercises.  Radiates up the front of his leg.  Nothing like pain from vasculitis which was aching after walking 10 feet.  Only on lower leg, none above knee.    Also has thickened toenail--left great toe is partially detached.    HTN:  Taking lisinopril only some days and some supplement other days.  Today was not a lisinopril day.  Reminded him the importance of taking his bp meds.  Past Medical History:  Diagnosis Date  . Aneurysm of left renal artery (HCC)    per CT 08-20-2015 stable 51mm  . 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 Bladder neck contracture   . BPH (benign prostatic hyperplasia)   . CKD (chronic kidney disease), stage III ([place])   . DDD (degenerative disc disease)   . Full dentures   . GERD (gastroesophageal reflux disease)   . History of prostate cancer DX  2002   S/P EXTERNAL RADIATION/ RADIOACTIVE SEED IMPLANTS  2003  . History of squamous cell carcinoma excision    2012--   RIGHT LOWER EXTREM  . HTN (hypertension)   . Hyperlipemia   . Hypothyroidism   . Large vessel vasculitis (HCC)    aortitis--- rheumotologist-  dr Charlestine Night  . Left renal artery stenosis (HCC)    proximal per CT 08-20-2015 but patent  . Left renal atrophy   . Macular degeneration   . Nocturia 08/08/2011  . OSA (obstructive sleep apnea) intolerant cpap  . Renal cyst, right   . Restless legs syndrome (RLS) 10/31/2011  . S/P radiation therapy ended 01-26-2012    for tonsillar cancer (head and neck) /  and external beam radiation for prostate cancer 2002  . Saliva decreased   . Self-catheterizes urinary bladder    QID   AND   PRN  . 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 Urethral stricture    chronic  . Urinary retention with incomplete bladder emptying   . Vitamin B 12 deficiency 01/28/2011  . Vitamin D deficiency     Past Surgical History:  Procedure Laterality Date  . BALLOON DILATION N/A 11/10/2014   Procedure:  CYSTO BALLOON DILATION AND RETROGRADE URETHROGRAM ;  Surgeon: Bjorn Loser, MD;  Location: [facility];  Service: Urology;  Laterality: N/A;  . CATARACT EXTRACTION W/ INTRAOCULAR LENS  IMPLANT, BILATERAL    . CYSTO/ BALLOON DILATION OF  URETHRAL STRICTURE  12-25-2010  . CYSTOSCOPY WITH RETROGRADE URETHROGRAM N/A 03/30/2016   Procedure: CYSTOSCOPY WITH RETROGRADE URETHROGRAM AND BALLOON DILATION with cystogram;  Surgeon: Bjorn Loser, MD;  Location: [facility];  Service: Urology;  Laterality: N/A;  . CYSTOSCOPY WITH URETHRAL DILATATION  05/31/2011   Procedure: CYSTOSCOPY WITH URETHRAL DILATATION;  Surgeon: Reece Packer, MD;  Location: [facility];   Service: Urology;  Laterality: N/A;  BALLOON DILATION  . CYSTOSCOPY WITH URETHRAL DILATATION N/A 09/13/2016   Procedure: CYSTOSCOPY WITH URETHRAL BALLOON DILATATION;  Surgeon: Bjorn Loser, MD;  Location: [place];  Service: Urology;  Laterality: N/A;  . CYSTOSCOPY/RETROGRADE/URETEROSCOPY N/A 05/22/2012   Procedure: CYSTOSCOPY BALLOON DILATION RETROGRADE URETEROGRAM ;  Surgeon: Reece Packer, MD;  Location: [facility];  Service: Urology;  Laterality: N/A;  . CYTSO/  DILATATION URETHRAL STRICTURE/  BX PROSTATIC URETHRA/  REMOVAL FOREIGN BODIES  06-25-2010   [facility]  . INGUINAL HERNIA REPAIR Right 2000  . RADIOACTIVE SEED IMPLANTS, PROSTATE  JAN  2003  . SKIN LESION EXCISION  07/2012   MOST  right shoulder    Allergies  Allergen Reactions  . Cephalosporins Rash  . Suprax [Cefixime] Rash    Outpatient Encounter Medications as of 12/29/2016  Medication Sig  . Ascorbic Acid (VITAMIN C) 1000 MG tablet Take 1,000 mg by mouth daily.  . ASHWAGANDHA PO Take by mouth daily.  Marland Kitchen buPROPion (WELLBUTRIN) 100 MG tablet TAKE 1 TABLET BY MOUTH TWICE DAILY  . CALCIUM PO Take 800 mg by mouth daily.   . Cholecalciferol (VITAMIN D) 2000 units CAPS Take by mouth daily.  . Coenzyme Q10 (COQ10) 100 MG CAPS Take 100 mg by mouth daily. Take one daily  . COLLAGEN PO Take 600 mg by mouth 2 (two) times daily.   . cyanocobalamin 100 MCG tablet Take 100 mcg by mouth daily.  Marland Kitchen DIGESTIVE ENZYMES PO Take 500 mg by mouth every morning.   . fish oil-omega-3 fatty acids 1000 MG capsule Take 2 g by mouth daily.  Marland Kitchen GLUCOSAMINE-CHONDROITIN-MSM PO Take 500 mg by mouth 2 (two) times daily.   Marland Kitchen Hyaluronic Acid-Vitamin C (HYALURONIC ACID PO) Take by mouth 2 (two) times daily.   Marland Kitchen lisinopril (PRINIVIL,ZESTRIL) 10 MG tablet TAKE ONE TABLET BY MOUTH ONCE DAILY  . Menaquinone-7 (VITAMIN K2 PO) Take by mouth daily.  . Multiple Vitamins-Minerals (MULTIVITAMINS THER. W/MINERALS) TABS Take 2 tablets  by mouth 2 (two) times daily.   . NP THYROID 90 MG tablet Take 90 mg by mouth every morning.   Marland Kitchen PREDNISONE PO Take 7.5 mg by mouth daily.  Marland Kitchen RESVERATROL PO Take 100 mg by mouth 2 (two) times daily.   . TURMERIC CURCUMIN PO Take 650 mg by mouth every morning.  . vitamin E (NATURAL VITAMIN E) 400 UNIT capsule Take 400 Units by mouth daily.   No facility-administered encounter medications on file as of 12/29/2016.     Review of Systems:  Review of Systems  Constitutional: Negative for chills, fever and malaise/fatigue.  Respiratory: Negative for shortness of breath.   Cardiovascular: Negative for chest pain and palpitations.  Gastrointestinal: Negative for abdominal pain.  Genitourinary: Negative for dysuria.  Musculoskeletal: Negative for falls.  Right anterior shin pain  Skin: Negative for itching and rash.  Neurological: Negative for tingling, sensory change, focal weakness, weakness and headaches.  Psychiatric/Behavioral: Negative for depression.    Health Maintenance  Topic Date Due  . TETANUS/TDAP  01/17/2013  . INFLUENZA VACCINE  08/17/2016  . PNA vac Low Risk Adult  Completed    Physical Exam: Vitals:   12/29/16 1017  BP: (!) 168/100  Pulse: 64  Temp: (!) 97.5 F (36.4 C)  TempSrc: Oral  SpO2: 96%  Weight: 162 lb (73.5 kg)   Body mass index is 22.59 kg/m. Physical Exam  Constitutional: He is oriented to person, place, and time. He appears well-developed and well-nourished. No distress.  Cardiovascular: Normal rate, regular rhythm, normal heart sounds and intact distal pulses.  Pulmonary/Chest: Effort normal and breath sounds normal. No respiratory distress.  Abdominal: Soft. Bowel sounds are normal.  Musculoskeletal: Normal range of motion. He exhibits no edema, tenderness or deformity.  No local tenderness of right foot or leg, no color change,pulses intact  Neurological: He is alert and oriented to person, place, and time.  Skin: Skin is warm and dry.  Capillary refill takes less than 2 seconds.  Left great toenail fungal, thickened and coming detached medially about halfway down the shaft  Psychiatric: He has a normal mood and affect.    Labs reviewed: Basic Metabolic Panel: Recent Labs    02/12/16 03/30/16 0801 09/13/16 1215  NA 143 140 143  K 4.3 4.4 3.9  CL  --  103 104  GLUCOSE  --  94 95  BUN 23* 26* 27*  CREATININE 1.6* 1.40* 1.70*   Liver Function Tests: Recent Labs    02/12/16  AST 20  ALT 17  ALKPHOS 67   No results for input(s): LIPASE, AMYLASE in the last 8760 hours. No results for input(s): AMMONIA in the last 8760 hours. CBC: Recent Labs    02/12/16 03/30/16 0801 09/13/16 1215  WBC 4.9  --   --   HGB 14.9 13.3 12.2*  HCT 42 39.0 36.0*  PLT 182  --   --    Assessment/Plan 1. Pain in right shin -suspect muscular injury from his balance class causing the pain, but could also be vasculitis related -has appt tomorrow with his rheumatologist so advised to discuss with her, as well  2. Onychomycosis -left great toenail -advised to apply vicks vaporub nightly and wear a sock, also put a bandaid over the nail so it does not catch on anything and get pulled off -recommended he see podiatry at [facility] also  3. Large vessel vasculitis (HCC) -aortitis -keep f/u with rheum   4.  Essential htn -take lisinopril as directed  Labs/tests ordered:  No orders of the defined types were placed in this encounter.  Next appt:  Visit date not found, prn  Murvin Gift L. Ladale Sherburn, D.O. [facility] [address], [place] [address] Cell Phone (Mon-Fri 8am-5pm):  [phone] On Call:  [phone] & follow prompts after 5pm & weekends Office Phone:  [phone] Office Fax:  [phone]

## 2016-12-30 DIAGNOSIS — I776 Arteritis, unspecified: Secondary | ICD-10-CM | POA: Diagnosis not present

## 2016-12-30 DIAGNOSIS — Z7952 Long term (current) use of systemic steroids: Secondary | ICD-10-CM | POA: Diagnosis not present

## 2016-12-30 DIAGNOSIS — M79606 Pain in leg, unspecified: Secondary | ICD-10-CM | POA: Diagnosis not present

## 2016-12-30 DIAGNOSIS — M25579 Pain in unspecified ankle and joints of unspecified foot: Secondary | ICD-10-CM | POA: Diagnosis not present

## 2016-12-30 DIAGNOSIS — R5383 Other fatigue: Secondary | ICD-10-CM | POA: Diagnosis not present

## 2016-12-30 DIAGNOSIS — N189 Chronic kidney disease, unspecified: Secondary | ICD-10-CM | POA: Diagnosis not present

## 2017-01-19 ENCOUNTER — Other Ambulatory Visit: Payer: Self-pay | Admitting: Internal Medicine

## 2017-01-19 DIAGNOSIS — M25571 Pain in right ankle and joints of right foot: Secondary | ICD-10-CM | POA: Diagnosis not present

## 2017-01-25 ENCOUNTER — Non-Acute Institutional Stay: Payer: Medicare Other | Admitting: Internal Medicine

## 2017-01-25 ENCOUNTER — Encounter: Payer: Self-pay | Admitting: Internal Medicine

## 2017-01-25 VITALS — BP 150/80 | HR 64 | Temp 98.1°F | Wt 165.0 lb

## 2017-01-25 DIAGNOSIS — M316 Other giant cell arteritis: Secondary | ICD-10-CM

## 2017-01-25 DIAGNOSIS — M7989 Other specified soft tissue disorders: Secondary | ICD-10-CM

## 2017-01-25 DIAGNOSIS — C61 Malignant neoplasm of prostate: Secondary | ICD-10-CM

## 2017-01-25 DIAGNOSIS — M79604 Pain in right leg: Secondary | ICD-10-CM | POA: Diagnosis not present

## 2017-01-25 DIAGNOSIS — C44722 Squamous cell carcinoma of skin of right lower limb, including hip: Secondary | ICD-10-CM | POA: Diagnosis not present

## 2017-01-25 DIAGNOSIS — M79661 Pain in right lower leg: Secondary | ICD-10-CM

## 2017-01-25 NOTE — Progress Notes (Signed)
Location:  Occupational psychologist of Service:  Clinic (12)  Provider: Jannet Calip L. Mariea Clonts, D.O., C.M.D.  Code Status: DNR Goals of Care:  Advanced Directives 09/28/2016  Does Patient Have a Medical Advance Directive? Yes  Type of Paramedic of [place];Out of facility DNR (pink MOST or yellow form)  Does patient want to make changes to medical advance directive? -  Copy of [place] in Chart? Yes  Would patient like information on creating a medical advance directive? -  Pre-existing out of facility DNR order (yellow form or pink MOST form) Yellow form placed in chart (order not valid for inpatient use)   Chief Complaint  Patient presents with  . Acute Visit    right shin pain    HPI: Patient is a 84 y.o. male seen today for an acute visit for right shin pain. Right leg was swollen yesterday and remains swollen today.  Right calf was 14.5 vs left 14 earlier today. He had extensive mohs [facility] his right shin [facility].   His wife is concerned that the swelling and tenderness are related to invasive squamous cell cancer in his right shin.  He also had prostate cancer for which he completed radiation therapy and records show "antineoplastic chemotherapy" in 2013--he had brachytherapy.  He has a recent diagnosis of aortitis, large vessel vasculitis in the past year, but his rheumatologist did not think that his symptoms were related to this.  He has good pulses and warmth.   This was noted on a CT abd/pelvis in May of last year as follows:   1. Dominant finding is long segment of vasculitis involving the entirety of the abdominal aorta and extending into the iliac arteries. Recommend rheumatological evaluation as to the etiology of vasculitis. 2. Dilatation of the RIGHT ureter without obstructing lesion. 3. Nodular indentation of the base the bladder from prostate. 4. LEFT kidney small compared to the RIGHT. He denies injury  and had a negative xray of his right lower leg at orthopedics.   He currently needs mohs on one leg and a resection on the other leg, both on shins.    Past Medical History:  Diagnosis Date  . Aneurysm of left renal artery (HCC)    per CT 08-20-2015 stable 75mm  . 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 Bladder neck contracture   . BPH (benign prostatic hyperplasia)   . CKD (chronic kidney disease), stage III ([place])   . DDD (degenerative disc disease)   . Full dentures   . GERD (gastroesophageal reflux disease)   . History of prostate cancer DX  2002   S/P EXTERNAL RADIATION/ RADIOACTIVE SEED IMPLANTS  2003  . History of squamous cell carcinoma excision    2012--  RIGHT LOWER EXTREM  . HTN (hypertension)   . Hyperlipemia   . Hypothyroidism   . Large vessel vasculitis (HCC)    aortitis--- rheumotologist-  dr Charlestine Night  . Left renal artery stenosis (HCC)    proximal per CT 08-20-2015 but patent  . Left renal atrophy   . Macular degeneration   . Nocturia 08/08/2011  . OSA (obstructive sleep apnea) intolerant cpap  . Renal cyst, right   . Restless legs syndrome (RLS) 10/31/2011  . S/P radiation therapy ended 01-26-2012    for tonsillar cancer (head and neck) /  and external beam radiation for prostate cancer 2002  . Saliva  decreased   . Self-catheterizes urinary bladder    QID   AND   PRN  . Tonsillar cancer ([place]) 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+) (Stage IVA)---- dx oct 2013  ----s/p concurent chemo and radiation/  ended 01-26-2012-- no surgical intervention---  residuals ( dry mouth, decreased saliva)   oncologist at [place]--  dr brizel--  HX OF -- NO RECURRENCE  . Urethral stricture    chronic  . Urinary retention with incomplete bladder emptying   . Vitamin B 12 deficiency 01/28/2011  . Vitamin D deficiency     Past Surgical History:  Procedure Laterality Date  .  BALLOON DILATION N/A 11/10/2014   Procedure: CYSTO BALLOON DILATION AND RETROGRADE URETHROGRAM ;  Surgeon: Bjorn Loser, MD;  Location: [facility];  Service: Urology;  Laterality: N/A;  . CATARACT EXTRACTION W/ INTRAOCULAR LENS  IMPLANT, BILATERAL    . CYSTO/ BALLOON DILATION OF  URETHRAL STRICTURE  12-25-2010  . CYSTOSCOPY WITH RETROGRADE URETHROGRAM N/A 03/30/2016   Procedure: CYSTOSCOPY WITH RETROGRADE URETHROGRAM AND BALLOON DILATION with cystogram;  Surgeon: Bjorn Loser, MD;  Location[facility];  Service: Urology;  Laterality: N/A;  . CYSTOSCOPY WITH URETHRAL DILATATION  05/31/2011   Procedure: CYSTOSCOPY WITH URETHRAL DILATATION;  Surgeon: Reece Packer, MD;  Location: [place];  Service: Urology;  Laterality: N/A;  BALLOON DILATION  . CYSTOSCOPY WITH URETHRAL DILATATION N/A 09/13/2016   Procedure: CYSTOSCOPY WITH URETHRAL BALLOON DILATATION;  Surgeon: Bjorn Loser, MD;  Location: [place];  Service: Urology;  Laterality: N/A;  . CYSTOSCOPY/RETROGRADE/URETEROSCOPY N/A 05/22/2012   Procedure: CYSTOSCOPY BALLOON DILATION RETROGRADE URETEROGRAM ;  Surgeon: Reece Packer, MD;  Location: [place];  Service: Urology;  Laterality: N/A;  . CYTSO/  DILATATION URETHRAL STRICTURE/  BX PROSTATIC URETHRA/  REMOVAL FOREIGN BODIES  06-25-2010   [facility]  . INGUINAL HERNIA REPAIR Right 2000  . RADIOACTIVE SEED IMPLANTS, PROSTATE  JAN  2003  . SKIN LESION EXCISION  07/2012   MOST  right shoulder    Allergies  Allergen Reactions  . Cephalosporins Rash  . Suprax [Cefixime] Rash    Outpatient Encounter Medications as of 01/25/2017  Medication Sig  . Ascorbic Acid (VITAMIN C) 1000 MG tablet Take 1,000 mg by mouth daily.  . ASHWAGANDHA PO Take by mouth daily.  Marland Kitchen buPROPion (WELLBUTRIN) 100 MG tablet TAKE 1 TABLET BY MOUTH TWICE DAILY  . CALCIUM PO Take 800 mg by mouth daily.   . Cholecalciferol (VITAMIN  D) 2000 units CAPS Take by mouth daily.  . Coenzyme Q10 (COQ10) 100 MG CAPS Take 100 mg by mouth daily. Take one daily  . COLLAGEN PO Take 600 mg by mouth 2 (two) times daily.   . cyanocobalamin 100 MCG tablet Take 100 mcg by mouth daily.  Marland Kitchen DIGESTIVE ENZYMES PO Take 500 mg by mouth every morning.   . fish oil-omega-3 fatty acids 1000 MG capsule Take 2 g by mouth daily.  Marland Kitchen GLUCOSAMINE-CHONDROITIN-MSM PO Take 500 mg by mouth 2 (two) times daily.   Marland Kitchen Hyaluronic Acid-Vitamin C (HYALURONIC ACID PO) Take by mouth 2 (two) times daily.   Marland Kitchen lisinopril (PRINIVIL,ZESTRIL) 10 MG tablet TAKE ONE TABLET BY MOUTH ONCE DAILY  . Menaquinone-7 (VITAMIN K2 PO) Take by mouth daily.  . Multiple Vitamins-Minerals (MULTIVITAMINS THER. W/MINERALS) TABS Take 2 tablets by mouth 2 (two) times daily.   . NP THYROID 90 MG tablet Take 90 mg by  mouth every morning.   Marland Kitchen RESVERATROL PO Take 100 mg by mouth 2 (two) times daily.   . TURMERIC CURCUMIN PO Take 650 mg by mouth every morning.  . vitamin E (NATURAL VITAMIN E) 400 UNIT capsule Take 400 Units by mouth daily.  . [DISCONTINUED] PREDNISONE PO Take 7.5 mg by mouth daily.   No facility-administered encounter medications on file as of 01/25/2017.     Review of Systems:  Review of Systems  Constitutional: Negative for chills, fever and malaise/fatigue.  HENT: Positive for hearing loss. Negative for congestion.   Eyes: Positive for double vision. Negative for blurred vision.  Respiratory: Negative for cough and shortness of breath.   Cardiovascular: Positive for leg swelling. Negative for chest pain and palpitations.       Right leg swelling up into calf  Gastrointestinal: Positive for constipation. Negative for abdominal pain, blood in stool and melena.  Genitourinary: Negative for dysuria.  Musculoskeletal: Negative for falls.       Right anterior shin and dorsum of foot pain  Skin: Negative for itching and rash.  Neurological: Negative for dizziness, loss of  consciousness and weakness.  Endo/Heme/Allergies: Does not bruise/bleed easily.  Psychiatric/Behavioral: Negative for depression and memory loss.    Health Maintenance  Topic Date Due  . TETANUS/TDAP  01/17/2013  . INFLUENZA VACCINE  08/17/2016  . PNA vac Low Risk Adult  Completed    Physical Exam: Vitals:   01/25/17 1134  BP: (!) 150/80  Pulse: 64  Temp: 98.1 F (36.7 C)  TempSrc: Oral  SpO2: 94%  Weight: 165 lb (74.8 kg)   Body mass index is 23.01 kg/m. Physical Exam  Constitutional: He is oriented to person, place, and time. He appears well-developed. No distress.  HENT:  Head: Normocephalic and atraumatic.  HOH  Eyes:  glasses  Cardiovascular: Normal rate, regular rhythm, normal heart sounds and intact distal pulses.  Pulmonary/Chest: Effort normal and breath sounds normal. No respiratory distress.  Abdominal: Bowel sounds are normal.  Musculoskeletal: Normal range of motion. He exhibits tenderness.  At ankle level anteriorly  Neurological: He is alert and oriented to person, place, and time. Coordination normal.  Skin: Capillary refill takes less than 2 seconds.  Two areas on anterior shins near ankles pending resection and mohs  Psychiatric: He has a normal mood and affect.    Labs reviewed: Basic Metabolic Panel: Recent Labs    02/12/16 03/30/16 0801 09/13/16 1215  NA 143 140 143  K 4.3 4.4 3.9  CL  --  103 104  GLUCOSE  --  94 95  BUN 23* 26* 27*  CREATININE 1.6* 1.40* 1.70*   Liver Function Tests: Recent Labs    02/12/16  AST 20  ALT 17  ALKPHOS 67   No results for input(s): LIPASE, AMYLASE in the last 8760 hours. No results for input(s): AMMONIA in the last 8760 hours. CBC: Recent Labs    02/12/16 03/30/16 0801 09/13/16 1215  WBC 4.9  --   --   HGB 14.9 13.3 12.2*  HCT 42 39.0 36.0*  PLT 182  --   --    Assessment/Plan 1. Pain and swelling of right lower leg -swelling of right vs left leg--variable day to day, pain he reports has  been for 2 mos, I'm aware of a few weeks of pain on anterior shin and dorsum of foot, seems deep, reports bone hurts -concerning due to history of prostate cancer, bilateral LE squamous cell ca s/p mohs and large  vessel vasculitis (aortitis that goes into iliac vessels per CT abd/pelvis) -xray negative for fracture  2. Pain of right lower extremity - see#1 - MR MRA LOWER EXTREMITY RIGHT W WO CONTRAST; Future  3. Large vessel vasculitis ([place]) -known, I suspect this is primary cause of pain and swelling, but pt wants answers and requests further imaging to assess etiology not found on xrays or examination  4. Squamous cell carcinoma, leg, right -for future mohs or resection (pt having one surgery on right and one on left) -?deep disease causing pain  5. Prostate cancer [facility]) -hopefully not metastatic disease--pt said had last tx in 2003 but was 2013 per chart--still distant past, but rule out with better image of bony structures of right leg  Labs/tests ordered:   Orders Placed This Encounter  Procedures  . MR MRA LOWER EXTREMITY RIGHT W WO CONTRAST    Standing Status:   Future    Standing Expiration Date:   03/26/2018    Order Specific Question:   If indicated for the ordered procedure, I authorize the administration of contrast media per Radiology protocol    Answer:   Yes    Order Specific Question:   What is the patient's sedation requirement?    Answer:   No Sedation    Order Specific Question:   Does the patient have a pacemaker or implanted devices?    Answer:   No    Order Specific Question:   Radiology Contrast Protocol - do NOT remove file path    Answer:   [email or web address]    Order Specific Question:   Reason for Exam additional comments    Answer:   right shin and dorsal foot pain, swelling, h/o aortitis, prostate cancer early 2000s and squamous cell s/p mohs    Order Specific Question:   Preferred imaging location?    Answer:   [facility] (table limit-550lbs)    Next appt:  02/08/2017 to review MRI and see how pain is doing  Ahtziri Jeffries L. Bobbye Reinitz, D.O. [facility] [address], [place] [address] Cell Phone (Mon-Fri 8am-5pm):  [phone] On Call:  [phone] & follow prompts after 5pm & weekends Office Phone:  [phone] Office Fax:  [phone]

## 2017-01-31 DIAGNOSIS — M79605 Pain in left leg: Secondary | ICD-10-CM | POA: Diagnosis not present

## 2017-01-31 DIAGNOSIS — Z23 Encounter for immunization: Secondary | ICD-10-CM | POA: Diagnosis not present

## 2017-01-31 DIAGNOSIS — C44722 Squamous cell carcinoma of skin of right lower limb, including hip: Secondary | ICD-10-CM | POA: Diagnosis not present

## 2017-01-31 DIAGNOSIS — M79604 Pain in right leg: Secondary | ICD-10-CM | POA: Diagnosis not present

## 2017-01-31 DIAGNOSIS — C44729 Squamous cell carcinoma of skin of left lower limb, including hip: Secondary | ICD-10-CM | POA: Diagnosis not present

## 2017-02-02 ENCOUNTER — Ambulatory Visit: Payer: No Typology Code available for payment source | Admitting: Family Medicine

## 2017-02-07 ENCOUNTER — Encounter: Payer: Self-pay | Admitting: Family Medicine

## 2017-02-07 ENCOUNTER — Ambulatory Visit (INDEPENDENT_AMBULATORY_CARE_PROVIDER_SITE_OTHER): Payer: Medicare Other | Admitting: Family Medicine

## 2017-02-07 VITALS — BP 149/88 | HR 65 | Ht 71.0 in | Wt 160.0 lb

## 2017-02-07 DIAGNOSIS — M79604 Pain in right leg: Secondary | ICD-10-CM

## 2017-02-07 NOTE — Patient Instructions (Signed)
I'm concerned you have a sarcoma of your lower leg. We will go ahead with an MRI with and without contrast of this area and I will contact you with the results. It's probable we will send you to Dr. Janice Coffin for further evaluation, possible biopsy depending on the MRI. Continue with compression, icing if needed, elevation. Stop physical therapy and home exercises though as these are unlikely to help.

## 2017-02-08 ENCOUNTER — Encounter: Payer: Medicare Other | Admitting: Internal Medicine

## 2017-02-08 ENCOUNTER — Telehealth: Payer: Self-pay | Admitting: Internal Medicine

## 2017-02-08 ENCOUNTER — Encounter: Payer: Self-pay | Admitting: Family Medicine

## 2017-02-08 ENCOUNTER — Telehealth: Payer: Self-pay | Admitting: Family Medicine

## 2017-02-08 DIAGNOSIS — M79604 Pain in right leg: Secondary | ICD-10-CM | POA: Diagnosis not present

## 2017-02-08 LAB — BASIC METABOLIC PANEL
BUN / CREAT RATIO: 15 (ref 10–24)
BUN: 23 mg/dL (ref 8–27)
CHLORIDE: 102 mmol/L (ref 96–106)
CO2: 26 mmol/L (ref 20–29)
Calcium: 9.1 mg/dL (ref 8.6–10.2)
Creatinine, Ser: 1.5 mg/dL — ABNORMAL HIGH (ref 0.76–1.27)
GFR calc Af Amer: 49 mL/min/{1.73_m2} — ABNORMAL LOW (ref >59)
GFR calc non Af Amer: 43 mL/min/{1.73_m2} — ABNORMAL LOW (ref >59)
GLUCOSE: 89 mg/dL (ref 65–99)
Potassium: 4.8 mmol/L (ref 3.5–5.2)
SODIUM: 143 mmol/L (ref 134–144)

## 2017-02-08 NOTE — Telephone Encounter (Signed)
Left message for patient to return my call.

## 2017-02-08 NOTE — Telephone Encounter (Signed)
Pt's wife, Angelita Ingles stopped by the office today at 1:30pm thinking she had an appointment.  Our office had left the usual automatic message saying someone in the household had an appointment, but it was meant to be Cleo's appt.  Angelita Ingles had already called and confirmed that her appt was next week.    Angelita Ingles reported "we never heard about the MRI Dr. Mariea Clonts ordered early this month" for [facility]. It had been two weeks so she took her husband with her to her physical therapist who said he needed to be seen immediately by a specialist (note he'd been to me, ortho and his rheumatologist).  The PT referred him to Dr. Barbaraann Barthel who ordered an MRI just as I had which got done this morning at [facility] due to concerns for a sarcoma.    I had been concerned about his ongoing symptoms myself, hence my MRI order on 1/9.  CMA and I investigated what had happened to the original order from 01/25/17 when pt was seen by me.  Turns out that [place] had left two messages on the main number (home number) voicemail and pt and his wife had never returned their call to schedule the MRI on  1/17 and 1/21.  Unclear what transpired there with their answering machine, unfortunately.    I will be on the lookout for the MRI results from this morning and appreciate Dr. Ericka Pontiff assistance with this situation.  Elysia Grand L. Zekiel Torian, D.O. [facility] [address], [place] [address] Cell Phone (Mon-Fri 8am-5pm):  [phone] On Call:  [phone] & follow prompts after 5pm & weekends Office Phone:  [phone] Office Fax:  [phone]

## 2017-02-08 NOTE — Telephone Encounter (Signed)
Please let her know once we get the results and I review them I will call him with the plan and if seeing Dr. Mylo Red is the next step.  But it all depends on the results of the MRI.

## 2017-02-08 NOTE — Telephone Encounter (Signed)
Patient's wife called requesting patient to be scheduled with Dr. Mylo Red as soon as possible

## 2017-02-08 NOTE — Progress Notes (Addendum)
PCP: Gayland Curry, DO  Subjective:   HPI: Patient is a 84 y.o. male here for right leg pain.  Patient was referred to [place] by [facility] PT with concern of possible DVT or other issue with his right lower leg. He has been seen there only twice now for rehab - they have not tried dry needling. His pain is sharp, worse with walking, anterior lower leg, currently 2/10 level. Associated swelling past 2 months. No numbness. Some redness and tenderness to touch. 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 Pain can wake him up at night. He is using a compression sleeve. Takes 5mg  prednisone daily due to his aortitis. Had an MRI MRA of leg ordered about 2 weeks ago to assess but waiting on scheduling for this.  Past Medical History:  Diagnosis Date  . Aneurysm of left renal artery (HCC)    per CT 08-20-2015 stable 60mm  . 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 Bladder neck contracture   . BPH (benign prostatic hyperplasia)   . CKD (chronic kidney disease), stage III ([place])   . DDD (degenerative disc disease)   . Full dentures   . GERD (gastroesophageal reflux disease)   . History of prostate cancer DX  2002   S/P EXTERNAL RADIATION/ RADIOACTIVE SEED IMPLANTS  2003  . History of squamous cell carcinoma excision    2012--  RIGHT LOWER EXTREM  . HTN (hypertension)   . Hyperlipemia   . Hypothyroidism   . Large vessel vasculitis (HCC)    aortitis--- rheumotologist-  dr Charlestine Night  . Left renal artery stenosis (HCC)    proximal per CT 08-20-2015 but patent  . Left renal atrophy   . Macular degeneration   . Nocturia 08/08/2011  . OSA (obstructive sleep apnea) intolerant cpap  . Renal cyst, right   . Restless legs syndrome (RLS) 10/31/2011  . S/P radiation therapy ended 01-26-2012    for tonsillar cancer (head and neck) /  and external beam radiation for prostate cancer 2002  . Saliva  decreased   . Self-catheterizes urinary bladder    QID   AND   PRN  . 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 Urethral stricture    chronic  . Urinary retention with incomplete bladder emptying   . Vitamin B 12 deficiency 01/28/2011  . Vitamin D deficiency     Current Outpatient Medications on File Prior to Visit  Medication Sig Dispense Refill  . Ascorbic Acid (VITAMIN C) 1000 MG tablet Take 1,000 mg by mouth daily.    . ASHWAGANDHA PO Take by mouth daily.    Marland Kitchen buPROPion (WELLBUTRIN) 100 MG tablet TAKE 1 TABLET BY MOUTH TWICE DAILY 180 tablet 0  . CALCIUM PO Take 800 mg by mouth daily.     . Cholecalciferol (VITAMIN D) 2000 units CAPS Take by mouth daily.    . Coenzyme Q10 (COQ10) 100 MG CAPS Take 100 mg by mouth daily. Take one daily    . COLLAGEN PO Take 600 mg by mouth 2 (two) times daily.     . cyanocobalamin 100 MCG tablet Take 100 mcg by mouth  daily.    Marland Kitchen DIGESTIVE ENZYMES PO Take 500 mg by mouth every morning.     . fish oil-omega-3 fatty acids 1000 MG capsule Take 2 g by mouth daily.    Marland Kitchen GLUCOSAMINE-CHONDROITIN-MSM PO Take 500 mg by mouth 2 (two) times daily.     Marland Kitchen Hyaluronic Acid-Vitamin C (HYALURONIC ACID PO) Take by mouth 2 (two) times daily.     Marland Kitchen lisinopril (PRINIVIL,ZESTRIL) 10 MG tablet TAKE ONE TABLET BY MOUTH ONCE DAILY 90 tablet 3  . Menaquinone-7 (VITAMIN K2 PO) Take by mouth daily.    . Multiple Vitamins-Minerals (MULTIVITAMINS THER. W/MINERALS) TABS Take 2 tablets by mouth 2 (two) times daily.     . NP THYROID 90 MG tablet Take 90 mg by mouth every morning.     Marland Kitchen RESVERATROL PO Take 100 mg by mouth 2 (two) times daily.     . TURMERIC CURCUMIN PO Take 650 mg by mouth every morning.    .  vitamin E (NATURAL VITAMIN E) 400 UNIT capsule Take 400 Units by mouth daily.     No current facility-administered medications on file prior to visit.     Past Surgical History:  Procedure Laterality Date  . BALLOON DILATION N/A 11/10/2014   Procedure: CYSTO BALLOON DILATION AND RETROGRADE URETHROGRAM ;  Surgeon: Bjorn Loser, MD;  Location: [facility];  Service: Urology;  Laterality: N/A;  . CATARACT EXTRACTION W/ INTRAOCULAR LENS  IMPLANT, BILATERAL    . CYSTO/ BALLOON DILATION OF  URETHRAL STRICTURE  12-25-2010  . CYSTOSCOPY WITH RETROGRADE URETHROGRAM N/A 03/30/2016   Procedure: CYSTOSCOPY WITH RETROGRADE URETHROGRAM AND BALLOON DILATION with cystogram;  Surgeon: Bjorn Loser, MD;  Location[facility];  Service: Urology;  Laterality: N/A;  . CYSTOSCOPY WITH URETHRAL DILATATION  05/31/2011   Procedure: CYSTOSCOPY WITH URETHRAL DILATATION;  Surgeon: Reece Packer, MD;  Location: [place];  Service: Urology;  Laterality: N/A;  BALLOON DILATION  . CYSTOSCOPY WITH URETHRAL DILATATION N/A 09/13/2016   Procedure: CYSTOSCOPY WITH URETHRAL BALLOON DILATATION;  Surgeon: Bjorn Loser, MD;  Location: [place];  Service: Urology;  Laterality: N/A;  . CYSTOSCOPY/RETROGRADE/URETEROSCOPY N/A 05/22/2012   Procedure: CYSTOSCOPY BALLOON DILATION RETROGRADE URETEROGRAM ;  Surgeon: Reece Packer, MD;  Location: [place];  Service: Urology;  Laterality: N/A;  . CYTSO/  DILATATION URETHRAL STRICTURE/  BX PROSTATIC URETHRA/  REMOVAL FOREIGN BODIES  06-25-2010   [facility]  . INGUINAL HERNIA REPAIR Right 2000  . RADIOACTIVE SEED IMPLANTS, PROSTATE  JAN  2003  . SKIN LESION EXCISION  07/2012   MOST  right shoulder    Allergies  Allergen Reactions  . Cephalosporins Rash  . Suprax [Cefixime] Rash    Social History   Socioeconomic History  . Marital status: Married    Spouse name: Not on file  . Number of  children: 4  . Years of education: college  . Highest education level: Not on file  Social Needs  . Financial resource strain: Not on file  . Food insecurity - worry: Not on file  . Food insecurity - inability: Not on file  . Transportation needs - medical: Not on file  . Transportation needs - non-medical: Not on file  Occupational History  . Occupation: retired  Tobacco Use  . Smoking status: Never Smoker  . Smokeless tobacco: Never Used  Substance and Sexual Activity  . Alcohol use: Yes    Alcohol/week: 4.2 oz    Types: 7 Glasses  of wine per week    Comment: 8oz, glass or red wine  . Drug use: No  . Sexual activity: Not on file  Other Topics Concern  . 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     Family History  Problem Relation Age of Onset  . Stroke Father     BP (!) 149/88   Pulse 65   Ht 5\' 11"  (1.803 m)   Wt 160 lb (72.6 kg)   BMI 22.32 kg/m   Review of Systems: See HPI above.     Objective:  Physical Exam:  Gen: NAD, comfortable in exam room  Right lower leg/ankle: Firm swollen anterior compartment of lower leg.  No overlying erythema, rash.  1+ pitting edema.   FROM ankle with tightness on dorsiflexion and plantarflexion but 5/5 strength. TTP anterior compartment proximally. Negative ant drawer and talar tilt.   Negative syndesmotic compression. Thompsons test negative. NV intact distally.  Left lower leg/ankle: No deformity. FROM with 5/5 strength. No tenderness to palpation. NVI distally.  MSK u/s right lower leg:  Large mass of anterior compartment displacing muscles measuring 2.84 x 3.44 x 11.37cm in length.  Vascularity noted within mass along with anechoic areas.  No calcifications.   Assessment & Plan:  1. Right lower leg pain - Exam, ultrasound consistent with a soft tissue mass - concern for a sarcoma.  We will go ahead  with MRI with and without contrast to further characterize.  Will likely then refer to Dr. Mylo Red at [facility] depending on results for possible biopsy and treatment options.  Compression, icing, tylenol if needed in meantime.  Stop PT.  Addendum:  MRI reviewed and discussed with patient - he does have large sarcoma noted in anterior compartment displacing anterior tibialis and extensor digitorum.  Will refer to Dr. Mylo Red for possibly biopsy and to discuss treatment options.

## 2017-02-09 ENCOUNTER — Ambulatory Visit: Payer: No Typology Code available for payment source | Admitting: Family Medicine

## 2017-02-09 ENCOUNTER — Encounter: Payer: Self-pay | Admitting: Family Medicine

## 2017-02-13 ENCOUNTER — Ambulatory Visit: Payer: No Typology Code available for payment source | Admitting: Family Medicine

## 2017-02-13 DIAGNOSIS — R2241 Localized swelling, mass and lump, right lower limb: Secondary | ICD-10-CM | POA: Insufficient documentation

## 2017-02-14 DIAGNOSIS — Z85818 Personal history of malignant neoplasm of other sites of lip, oral cavity, and pharynx: Secondary | ICD-10-CM | POA: Diagnosis not present

## 2017-02-14 DIAGNOSIS — D481 Neoplasm of uncertain behavior of connective and other soft tissue: Secondary | ICD-10-CM | POA: Diagnosis not present

## 2017-02-14 DIAGNOSIS — Z923 Personal history of irradiation: Secondary | ICD-10-CM | POA: Diagnosis not present

## 2017-02-14 DIAGNOSIS — C770 Secondary and unspecified malignant neoplasm of lymph nodes of head, face and neck: Secondary | ICD-10-CM | POA: Diagnosis not present

## 2017-02-14 DIAGNOSIS — E039 Hypothyroidism, unspecified: Secondary | ICD-10-CM | POA: Diagnosis not present

## 2017-02-14 DIAGNOSIS — R2241 Localized swelling, mass and lump, right lower limb: Secondary | ICD-10-CM | POA: Diagnosis not present

## 2017-02-14 DIAGNOSIS — Z85828 Personal history of other malignant neoplasm of skin: Secondary | ICD-10-CM | POA: Diagnosis not present

## 2017-02-14 DIAGNOSIS — C4921 Malignant neoplasm of connective and soft tissue of right lower limb, including hip: Secondary | ICD-10-CM | POA: Diagnosis not present

## 2017-02-14 DIAGNOSIS — C099 Malignant neoplasm of tonsil, unspecified: Secondary | ICD-10-CM | POA: Diagnosis not present

## 2017-02-15 DIAGNOSIS — C44729 Squamous cell carcinoma of skin of left lower limb, including hip: Secondary | ICD-10-CM | POA: Diagnosis not present

## 2017-02-15 DIAGNOSIS — D0471 Carcinoma in situ of skin of right lower limb, including hip: Secondary | ICD-10-CM | POA: Diagnosis not present

## 2017-02-28 DIAGNOSIS — M79604 Pain in right leg: Secondary | ICD-10-CM | POA: Diagnosis not present

## 2017-02-28 DIAGNOSIS — K117 Disturbances of salivary secretion: Secondary | ICD-10-CM | POA: Diagnosis not present

## 2017-02-28 DIAGNOSIS — R918 Other nonspecific abnormal finding of lung field: Secondary | ICD-10-CM | POA: Diagnosis not present

## 2017-02-28 DIAGNOSIS — R252 Cramp and spasm: Secondary | ICD-10-CM | POA: Diagnosis not present

## 2017-02-28 DIAGNOSIS — Z9221 Personal history of antineoplastic chemotherapy: Secondary | ICD-10-CM | POA: Diagnosis not present

## 2017-02-28 DIAGNOSIS — C4921 Malignant neoplasm of connective and soft tissue of right lower limb, including hip: Secondary | ICD-10-CM | POA: Diagnosis not present

## 2017-02-28 DIAGNOSIS — E039 Hypothyroidism, unspecified: Secondary | ICD-10-CM | POA: Diagnosis not present

## 2017-02-28 DIAGNOSIS — D481 Neoplasm of uncertain behavior of connective and other soft tissue: Secondary | ICD-10-CM | POA: Diagnosis not present

## 2017-02-28 DIAGNOSIS — Z85818 Personal history of malignant neoplasm of other sites of lip, oral cavity, and pharynx: Secondary | ICD-10-CM | POA: Diagnosis not present

## 2017-02-28 DIAGNOSIS — R9431 Abnormal electrocardiogram [ECG] [EKG]: Secondary | ICD-10-CM | POA: Diagnosis not present

## 2017-02-28 DIAGNOSIS — I119 Hypertensive heart disease without heart failure: Secondary | ICD-10-CM | POA: Diagnosis not present

## 2017-02-28 DIAGNOSIS — R432 Parageusia: Secondary | ICD-10-CM | POA: Diagnosis not present

## 2017-03-07 DIAGNOSIS — I776 Arteritis, unspecified: Secondary | ICD-10-CM | POA: Diagnosis not present

## 2017-03-08 ENCOUNTER — Ambulatory Visit (INDEPENDENT_AMBULATORY_CARE_PROVIDER_SITE_OTHER): Payer: Medicare Other | Admitting: Neurology

## 2017-03-08 ENCOUNTER — Encounter: Payer: Self-pay | Admitting: Neurology

## 2017-03-08 VITALS — BP 164/102 | HR 82 | Ht 71.0 in | Wt 168.0 lb

## 2017-03-08 DIAGNOSIS — Z9989 Dependence on other enabling machines and devices: Secondary | ICD-10-CM | POA: Diagnosis not present

## 2017-03-08 DIAGNOSIS — G4733 Obstructive sleep apnea (adult) (pediatric): Secondary | ICD-10-CM | POA: Diagnosis not present

## 2017-03-08 NOTE — Progress Notes (Signed)
Subjective:    Patient ID: Philip [name] is a 84 y.o. male.  HPI     Interim history:   Philip [name] is an 84 year old right-handed gentleman with an underlying complex medical history of prostate cancer, status post seed implants, hypertension, tonsillar cancer, status post XRT, restless leg syndrome, vitamin D deficiency, vitamin B12 deficiency, hyperlipidemia, degenerative disc disease, squamous cell cancer, hypothyroidism, reflux disease, macular degeneration, who presents for follow-up consultation of his obstructive sleep apnea after her recent sleep study testing. The patient is unaccompanied today. I last saw him on 10/10/2016, at which time he reported having difficulty with sleep maintenance. He had some intermittent restless leg symptoms. He had intermittent vertigo. He had a prior diagnosis of OSA but had CPAP intolerance at the time, likely also tried BiPAP in the past. I invited him for sleep study testing. He had a baseline sleep study, followed by a CPAP titration study. I went over his test results with him in detail today. Baseline sleep study from 11/13/2016 showed a sleep efficiency reduced at 67.2%, sleep latency of 50 minutes which is delayed and REM latency was delayed at 159 minutes. He had an increased percentage of stage II sleep, slow-wave sleep was absent, REM sleep was normal at 21.1%. Total AHI was 16.4 per hour, REM AHI 39.4 per hour, supine AHI 34.2 per hour, average oxygen saturation was 96%, nadir was 66%. He had borderline PLMS. Based on his medical history and sleep related complaints as well as test results with moderate to severe obstructive sleep apnea I asked him to return for a full night CPAP titration study. He had this on 12/19/2016. Sleep efficiency was 86.5%, sleep latency 15.5 minutes and REM latency 178 minutes. He had an increased percentage of stage II sleep, slow-wave sleep was absent, REM sleep was 10.6%. He was fitted with medium nasal pillows and CPAP  was titrated from 5 cm to 10 cm. On the final pressure his AHI was 0 per hour with supine non-REM sleep achieved an O2 nadir of 91%. He had severe PLMS with an index of 98.9 per hour and an associated arousal index of 17 per hour.  Today, 03/08/2017: I reviewed his CPAP compliance data from 02/05/2017 through 03/06/2017 which is a total of 30 days, during which time he used his CPAP 26 days with percent used days greater than 4 hours at 83%, indicating very good compliance with an average usage of 9 hours and 0 minutes, residual AHI at goal at 3 per hour, leak high with the 95th percentile at 41.4 L/m on a pressure of 10 cm with EPR of 3. 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 It started with the pain around the right forefoot and ankle area, then as a swelling which crept up his right leg into the right calf area. He had a biopsy which confirmed sarcoma. He is scheduled for surgery next week at [facility] for this. He has had discomfort. This may explain his leg movements in both sleep studies, particularly and the second study in December. He has had intermittent restless leg symptoms but this is not a consistent or frequent issue. Overall, he is willing to continue with CPAP therapy. He indicates some sleep consolidation but daytime energy is not necessarily improved but he also admits that he has other medical issues going on at this point which could explain the lack of energy.   The patient's allergies, current medications, family history, past medical history, past social  history, past surgical history and problem list were reviewed and updated as appropriate.    Previously (copied from previous notes for reference):    I first met him on 05/12/2015 at the request of his primary care physician, at which time he reported a prior diagnosis of obstructive sleep apnea. He was complaining about nonrestorative sleep and not be able to sleep through the night. He struggled with CPAP and could  not tolerate it, apparently also tried BiPAP. He was invited for a sleep study but did not schedule it at the time. He returns for reevaluation.    05/12/2015: (He) was previously diagnosed with obstructive sleep apnea. I reviewed his CPAP titration study from 02/25/2008, interpreted by Dr. Gwenette Greet. Sleep efficiency was 89.4%, sleep latency was 13.5 minutes, REM latency was 102 minutes. CPAP was titrated from 4 cm to 11 cm. PLM index was 69.7/h.  He had a baseline sleep study on 01/27/2008. This was interpreted by Dr. Mickeal Skinner and I reviewed the results. AHI was 18.6 per hour. Average oxygen saturation was 89%, nadir was 85%. PLM index was 24.6 per hour. Sleep efficiency was 76.6%, sleep latency 7 minutes, REM latency 162.5 minutes.    His main complaint about his sleep is nonrestorative sleep and not being able to sleep through the night with at times multiple nighttime awakenings. He does not have significant difficulty falling asleep. Originally, in 2010 when he had a sleep study evaluation he was having issues with anxiety and his wife had noted pauses in his breathing at night. He struggled with the CPAP for about a year. He tried different masks and even was tried on BiPAP as I understand for about a week and could not tolerate it either. He was about 165 pounds at the time of his sleep study testing, then lost a lot of weight, and gain some back, maintaining now between higher 150s and lower 160s typically. He has no family history of OSA or restless leg syndrome. His leg twitching and kicking can be quite vehement at night and is almost nightly per wife. He takes magnesium over-the-counter for this but not every night and feels it has helped some but would be willing to try a dopamine agonists and did some research on Requip.   He is a retired Insurance underwriter and worked for [employer] for about 30 years and has also been in [employer]. He spent some time in [place] and neuropathy. He has 4 grown  children. He lives with his wife. He has 5 grandchildren. He drinks alcohol in the form of wine, 1-2 glasses per night, he is a never smoker and does not use any illicit drugs. Caffeine is in the form of coffee, usually 2 cups per day in the morning. He does not drink sodas.   He does not take any sleep aids. He likes to take over-the-counter supplements or natural medications rather than prescription medications he admits. He has his radiology oncology follow-up every 6 months at [facility] for his tonsillar cancer.   He self catheterizes, does not have to catheterize in the middle of the night. He denies morning headaches. His Epworth sleepiness score is 3 out of 24 today, his fatigue score is 31 out of 63. He does not typically take a nap. His bedtime is around 10 PM, rise time is around 7:30 AM, he feels marginally or adequately rested on most mornings. He has occasional leg cramps.   His Past Medical History Is Significant For: Past Medical History:  Diagnosis Date  . Aneurysm of left renal artery (HCC)    per CT 08-20-2015 stable 40m  . 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 Bladder neck contracture   . BPH (benign prostatic hyperplasia)   . CKD (chronic kidney disease), stage III [place]   . DDD (degenerative disc disease)   . Full dentures   . GERD (gastroesophageal reflux disease)   . History of prostate cancer DX  2002   S/P EXTERNAL RADIATION/ RADIOACTIVE SEED IMPLANTS  2003  . History of squamous cell carcinoma excision    2012--  RIGHT LOWER EXTREM  . HTN (hypertension)   . Hyperlipemia   . Hypothyroidism   . Large vessel vasculitis (HCC)    aortitis--- rheumotologist-  dr tCharlestine Night . Left renal artery stenosis (HCC)    proximal per CT 08-20-2015 but patent  . Left renal atrophy   . Macular degeneration   . Nocturia 08/08/2011  . OSA (obstructive sleep apnea) intolerant cpap  . Renal cyst, right   .  Restless legs syndrome (RLS) 10/31/2011  . S/P radiation therapy ended 01-26-2012    for tonsillar cancer (head and neck) /  and external beam radiation for prostate cancer 2002  . Saliva decreased   . Self-catheterizes urinary bladder    QID   AND   PRN  . Tonsillar cancer ([place] 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+) (Stage IVA)---- dx oct 2013  ----s/p concurent chemo and radiation/  ended 01-26-2012-- no surgical intervention---  residuals ( dry mouth, decreased saliva)   [facility] [place]  [place]--  HX OF -- NO RECURRENCE  . Urethral stricture    chronic  . Urinary retention with incomplete bladder emptying   . Vitamin B 12 deficiency 01/28/2011  . Vitamin D deficiency     His Past Surgical History Is Significant For: Past Surgical History:  Procedure Laterality Date  . BALLOON DILATION N/A 11/10/2014   Procedure: CYSTO BALLOON DILATION AND RETROGRADE URETHROGRAM ;  Surgeon: SBjorn Loser MD;  [facility]  Service: Urology;  Laterality: N/A;  . CATARACT EXTRACTION W/ INTRAOCULAR LENS  IMPLANT, BILATERAL    . CYSTO/ BALLOON DILATION OF  URETHRAL STRICTURE  12-25-2010  . CYSTOSCOPY WITH RETROGRADE URETHROGRAM N/A 03/30/2016   Procedure: CYSTOSCOPY WITH RETROGRADE URETHROGRAM AND BALLOON DILATION with cystogram;  Surgeon: SBjorn Loser MD;  [facility]  Service: Urology;  Laterality: N/A;  . CYSTOSCOPY WITH URETHRAL DILATATION  05/31/2011   Procedure: CYSTOSCOPY WITH URETHRAL DILATATION;  Surgeon: SReece Packer MD;  [place]  Service: Urology;  Laterality: N/A;  BALLOON DILATION  . CYSTOSCOPY WITH URETHRAL DILATATION N/A 09/13/2016   Procedure: CYSTOSCOPY WITH URETHRAL BALLOON DILATATION;  Surgeon: MBjorn Loser MD;  [place]  Service: Urology;  Laterality: N/A;  . CYSTOSCOPY/RETROGRADE/URETEROSCOPY N/A 05/22/2012    Procedure: CYSTOSCOPY BALLOON DILATION RETROGRADE URETEROGRAM ;  Surgeon: SReece Packer MD;  [place]  Service: Urology;  Laterality: N/A;  . CYTSO/  DILATATION URETHRAL STRICTURE/  BX PROSTATIC URETHRA/  REMOVAL FOREIGN BODIES  06-25-2010   [facility]  . INGUINAL HERNIA REPAIR Right 2000  . RADIOACTIVE SEED IMPLANTS, PROSTATE  JAN  2003  . SKIN LESION EXCISION  07/2012   MOST  right shoulder    His Family History Is Significant For: Family History  Problem Relation Age  of Onset  . Stroke Father     His Social History Is Significant For: Social History   Socioeconomic History  . Marital status: Married    Spouse name: None  . Number of children: 4  . Years of education: college  . Highest education level: None  Social Needs  . Financial resource strain: None  . Food insecurity - worry: None  . Food insecurity - inability: None  . Transportation needs - medical: None  . Transportation needs - non-medical: None  Occupational History  . Occupation: retired  Tobacco Use  . Smoking status: Never Smoker  . Smokeless tobacco: Never Used  Substance and Sexual Activity  . Alcohol use: Yes    Alcohol/week: 4.2 oz    Types: 7 Glasses of wine per week    Comment: 8oz, glass or red wine  . Drug use: No  . Sexual activity: None  Other Topics Concern  . 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     His Allergies Are:  Allergies  Allergen Reactions  . Cephalosporins Rash  . Suprax [Cefixime] Rash  :   His Current Medications Are:  Outpatient Encounter Medications as of 03/08/2017  Medication Sig  . Ascorbic Acid (VITAMIN C) 1000 MG tablet Take 1,000 mg by mouth daily.  . ASHWAGANDHA PO Take by mouth daily.  Marland Kitchen buPROPion (WELLBUTRIN) 100 MG tablet TAKE 1 TABLET BY MOUTH TWICE DAILY  . CALCIUM PO Take 800 mg by mouth daily.   . Cholecalciferol  (VITAMIN D) 2000 units CAPS Take by mouth daily.  . Coenzyme Q10 (COQ10) 100 MG CAPS Take 100 mg by mouth daily. Take one daily  . COLLAGEN PO Take 600 mg by mouth 2 (two) times daily.   . cyanocobalamin 100 MCG tablet Take 100 mcg by mouth daily.  Marland Kitchen DIGESTIVE ENZYMES PO Take 500 mg by mouth every morning.   . fish oil-omega-3 fatty acids 1000 MG capsule Take 2 g by mouth daily.  Marland Kitchen GLUCOSAMINE-CHONDROITIN-MSM PO Take 500 mg by mouth 2 (two) times daily.   Marland Kitchen Hyaluronic Acid-Vitamin C (HYALURONIC ACID PO) Take by mouth 2 (two) times daily.   Marland Kitchen lisinopril (PRINIVIL,ZESTRIL) 10 MG tablet TAKE ONE TABLET BY MOUTH ONCE DAILY  . Menaquinone-7 (VITAMIN K2 PO) Take by mouth daily.  . Multiple Vitamins-Minerals (MULTIVITAMINS THER. W/MINERALS) TABS Take 2 tablets by mouth 2 (two) times daily.   . NP THYROID 90 MG tablet Take 90 mg by mouth every morning.   Marland Kitchen RESVERATROL PO Take 100 mg by mouth 2 (two) times daily.   . TURMERIC CURCUMIN PO Take 650 mg by mouth every morning.  . vitamin E (NATURAL VITAMIN E) 400 UNIT capsule Take 400 Units by mouth daily.   No facility-administered encounter medications on file as of 03/08/2017.   :  Review of Systems:  Out of a complete 14 point review of systems, all are reviewed and negative with the exception of these symptoms as listed below:  Review of Systems  Neurological:       Pt reports that he has developed a sarcoma on his leg. Pt reports that he is using his cpap nightly.    Objective:  Neurological Exam  Physical Exam Physical Examination:   Vitals:   03/08/17 1414  BP: (!) 164/102  Pulse: 82   General Examination: The patient is a very pleasant 85  y.o. male in no acute distress. He appears well-developed and well-nourished and well groomed. Somber.   HEENT:Normocephalic, atraumatic, pupils are equal, round and reactive to light and accommodation. Corrective eye glasses. Extraocular tracking is good without limitation to gaze excursion or  nystagmus noted. Normal smooth pursuit is noted. Hearing is grossly intact. Face is symmetric with normal facial animation and normal facial sensation. Speech is clear with no dysarthria noted. There is no hypophonia. There is no lip, neck/head, jaw or voice tremor. Neck is supple with FROM. Oropharynx exam reveals: mild mouth dryness, adequate dental hygiene with partial plate in place and mild airway crowding. Tongue protrudes centrally and palate elevates symmetrically.   Chest:Clear to auscultation without wheezing, rhonchi or crackles noted.  Heart:S1+S2+0, regular and normal without murmurs, rubs or gallops noted.   Abdomen:Soft, non-tender and non-distended with normal bowel sounds appreciated on auscultation.  Extremities:There is trace pitting edema in the left ankle, 1+ edema in the right ankle and swelling noted in the right lateral leg area, calf area, inconspicuous scar from a biopsy.   Skin: Warm and dry without trophic changes noted. There are no varicose veins.  Musculoskeletal: exam reveals no obvious joint deformities, tenderness or joint swelling or erythema.   Neurologically:  Mental status: The patient is awake, alert and oriented in all 4 spheres. His immediate and remote memory, attention, language skills and fund of knowledge are appropriate. There is no evidence of aphasia, agnosia, apraxia or anomia. Speech is clear with normal prosody and enunciation. Thought process is linear. Mood is normaland affect is normal.  Cranial nerves II - XII are as described above under HEENT exam. In addition: shoulder shrug is normal with equal shoulder height noted. Motor exam: Normal bulk, strength and tone is noted. There is no drift, tremor. Fine motor skills and coordination: intact with normal finger taps, normal hand movements, normal rapid alternating patting, normal foot taps and normal foot agility.  Cerebellar testing: No dysmetria or intention tremor on finger to nose  testing. There is no truncal or gait ataxia.  Sensory exam: intact to light touch in the upper and lower extremities.  Gait, station and balance: He stands easily. No veering to one side is noted. No leaning to one side is noted. Posture is age-appropriate and stance is narrow based. Gait shows normalstride length and normalpace. No problems turning are noted.   Assessment and Plan:  In summary, Philip [name] a very pleasant 84 year old male with an underlying complex medical history of prostate cancer, status post seed implants, hypertension, tonsillar cancer, status post XRT, restless leg syndrome, vitamin D deficiency, vitamin B12 deficiency, hyperlipidemia, degenerative disc disease, squamous cell cancer, hypothyroidism, reflux disease, macular degeneration, and recent sarcoma Dx with pending surgery next week at [facility], whopresents for follow-up consultation of his obstructive sleep apnea, after sleep study testing recently. He had a baseline sleep study in October 2018, followed by a CPAP titration study in December 2018. He is compliant with CPAP therapy. He is commended for this. He indicates reasonably good results, leak is on the high side. AHI at goal on a pressure of 10 cm. We mutually agreed to reduce the pressure to 9 cm to see if the leak improved. He uses nasal pillows. He does not have severe mouth dryness or significant discomfort from the mask. Nevertheless, reducing the pressure may help a little bit, we will track if the AHI stays below 5 per hour on a pressure of 9 cm. For that, I  have asked him to call us in about 6 weeks so we can pull another 30 day download on a pressure of 9 at the time. If all goes well I would like to see him back in 6 months, sooner if needed. I answered all his questions today and he was in agreement.  I spent 25 minutes in total face-to-face time with the patient, more than 50% of which was spent in counseling and coordination of care, reviewing test  results, reviewing medication and discussing or reviewing the diagnosis of OSA, its prognosis and treatment options. Pertinent laboratory and imaging test results that were available during this visit with the patient were reviewed by me and considered in my medical decision making (see chart for details).

## 2017-03-08 NOTE — Patient Instructions (Signed)
Please continue using your CPAP regularly. While your insurance requires that you use CPAP at least 4 hours each night on 70% of the nights, I recommend, that you not skip any nights and use it throughout the night if you can. Getting used to CPAP and staying with the treatment long term does take time and patience and discipline. Untreated obstructive sleep apnea when it is moderate to severe can have an adverse impact on cardiovascular health and raise her risk for heart disease, arrhythmias, hypertension, congestive heart failure, stroke and diabetes. Untreated obstructive sleep apnea causes sleep disruption, nonrestorative sleep, and sleep deprivation. This can have an impact on your day to day functioning and cause daytime sleepiness and impairment of cognitive function, memory loss, mood disturbance, and problems focussing. Using CPAP regularly can improve these symptoms. As discussed, we will reduce your pressure some, to 9 cm, to see if the leak improves.  Call us in about 6 weeks, so we can look at another 30 day download while your are on a pressure of 9 cm.

## 2017-03-09 DIAGNOSIS — C4921 Malignant neoplasm of connective and soft tissue of right lower limb, including hip: Secondary | ICD-10-CM | POA: Diagnosis not present

## 2017-03-09 DIAGNOSIS — M79661 Pain in right lower leg: Secondary | ICD-10-CM | POA: Diagnosis not present

## 2017-03-09 DIAGNOSIS — M7989 Other specified soft tissue disorders: Secondary | ICD-10-CM | POA: Diagnosis not present

## 2017-03-09 DIAGNOSIS — R2241 Localized swelling, mass and lump, right lower limb: Secondary | ICD-10-CM | POA: Diagnosis not present

## 2017-03-10 DIAGNOSIS — D485 Neoplasm of uncertain behavior of skin: Secondary | ICD-10-CM | POA: Diagnosis not present

## 2017-03-14 DIAGNOSIS — Z7952 Long term (current) use of systemic steroids: Secondary | ICD-10-CM | POA: Diagnosis not present

## 2017-03-14 DIAGNOSIS — I776 Arteritis, unspecified: Secondary | ICD-10-CM | POA: Diagnosis not present

## 2017-03-14 DIAGNOSIS — N189 Chronic kidney disease, unspecified: Secondary | ICD-10-CM | POA: Diagnosis not present

## 2017-03-14 DIAGNOSIS — M25579 Pain in unspecified ankle and joints of unspecified foot: Secondary | ICD-10-CM | POA: Diagnosis not present

## 2017-03-14 DIAGNOSIS — M79606 Pain in leg, unspecified: Secondary | ICD-10-CM | POA: Diagnosis not present

## 2017-03-14 DIAGNOSIS — R5383 Other fatigue: Secondary | ICD-10-CM | POA: Diagnosis not present

## 2017-03-15 DIAGNOSIS — Z8546 Personal history of malignant neoplasm of prostate: Secondary | ICD-10-CM | POA: Diagnosis not present

## 2017-03-15 DIAGNOSIS — R911 Solitary pulmonary nodule: Secondary | ICD-10-CM | POA: Diagnosis present

## 2017-03-15 DIAGNOSIS — Z923 Personal history of irradiation: Secondary | ICD-10-CM | POA: Diagnosis not present

## 2017-03-15 DIAGNOSIS — I10 Essential (primary) hypertension: Secondary | ICD-10-CM | POA: Diagnosis not present

## 2017-03-15 DIAGNOSIS — G8918 Other acute postprocedural pain: Secondary | ICD-10-CM | POA: Diagnosis not present

## 2017-03-15 DIAGNOSIS — T86828 Other complications of skin graft (allograft) (autograft): Secondary | ICD-10-CM | POA: Diagnosis not present

## 2017-03-15 DIAGNOSIS — G4733 Obstructive sleep apnea (adult) (pediatric): Secondary | ICD-10-CM | POA: Diagnosis present

## 2017-03-15 DIAGNOSIS — Z85828 Personal history of other malignant neoplasm of skin: Secondary | ICD-10-CM | POA: Diagnosis not present

## 2017-03-15 DIAGNOSIS — G4700 Insomnia, unspecified: Secondary | ICD-10-CM | POA: Diagnosis present

## 2017-03-15 DIAGNOSIS — N35813 Other membranous urethral stricture, male: Secondary | ICD-10-CM | POA: Diagnosis present

## 2017-03-15 DIAGNOSIS — R338 Other retention of urine: Secondary | ICD-10-CM | POA: Diagnosis present

## 2017-03-15 DIAGNOSIS — C4921 Malignant neoplasm of connective and soft tissue of right lower limb, including hip: Secondary | ICD-10-CM | POA: Diagnosis not present

## 2017-03-15 DIAGNOSIS — C499 Malignant neoplasm of connective and soft tissue, unspecified: Secondary | ICD-10-CM | POA: Diagnosis not present

## 2017-03-15 DIAGNOSIS — N401 Enlarged prostate with lower urinary tract symptoms: Secondary | ICD-10-CM | POA: Diagnosis present

## 2017-03-15 DIAGNOSIS — Z85818 Personal history of malignant neoplasm of other sites of lip, oral cavity, and pharynx: Secondary | ICD-10-CM | POA: Diagnosis not present

## 2017-03-15 DIAGNOSIS — E039 Hypothyroidism, unspecified: Secondary | ICD-10-CM | POA: Diagnosis not present

## 2017-03-16 DIAGNOSIS — T86829 Unspecified complication of skin graft (allograft) (autograft): Secondary | ICD-10-CM | POA: Insufficient documentation

## 2017-03-18 MED ORDER — ENOXAPARIN SODIUM 40 MG/0.4ML ~~LOC~~ SOLN
40.00 | SUBCUTANEOUS | Status: DC
Start: 2017-03-18 — End: 2017-03-18

## 2017-03-18 MED ORDER — GENERIC EXTERNAL MEDICATION
Status: DC
Start: ? — End: 2017-03-18

## 2017-03-18 MED ORDER — POLYETHYLENE GLYCOL 3350 17 G PO PACK
17.00 | PACK | ORAL | Status: DC
Start: 2017-03-18 — End: 2017-03-18

## 2017-03-18 MED ORDER — BISACODYL 10 MG RE SUPP
10.00 | RECTAL | Status: DC
Start: ? — End: 2017-03-18

## 2017-03-18 MED ORDER — MULTI-VITAMINS PO TABS
1.00 | ORAL_TABLET | ORAL | Status: DC
Start: 2017-03-18 — End: 2017-03-18

## 2017-03-18 MED ORDER — LACTATED RINGERS IV SOLN
INTRAVENOUS | Status: DC
Start: ? — End: 2017-03-18

## 2017-03-18 MED ORDER — SENNOSIDES-DOCUSATE SODIUM 8.6-50 MG PO TABS
1.00 | ORAL_TABLET | ORAL | Status: DC
Start: 2017-03-18 — End: 2017-03-18

## 2017-03-18 MED ORDER — LISINOPRIL 10 MG PO TABS
10.00 | ORAL_TABLET | ORAL | Status: DC
Start: 2017-03-18 — End: 2017-03-18

## 2017-03-18 MED ORDER — BUPROPION HCL 100 MG PO TABS
100.00 | ORAL_TABLET | ORAL | Status: DC
Start: 2017-03-18 — End: 2017-03-18

## 2017-03-18 MED ORDER — LEVOTHYROXINE SODIUM 100 MCG PO TABS
100.00 | ORAL_TABLET | ORAL | Status: DC
Start: 2017-03-18 — End: 2017-03-18

## 2017-03-18 MED ORDER — HYDROMORPHONE HCL 1 MG/ML IJ SOLN
0.50 | INTRAMUSCULAR | Status: DC
Start: ? — End: 2017-03-18

## 2017-03-18 MED ORDER — PANTOPRAZOLE SODIUM 40 MG PO TBEC
40.00 | DELAYED_RELEASE_TABLET | ORAL | Status: DC
Start: 2017-03-18 — End: 2017-03-18

## 2017-03-18 MED ORDER — ACETAMINOPHEN 325 MG PO TABS
975.00 | ORAL_TABLET | ORAL | Status: DC
Start: 2017-03-18 — End: 2017-03-18

## 2017-03-18 MED ORDER — CHOLECALCIFEROL 25 MCG (1000 UT) PO TABS
2000.00 | ORAL_TABLET | ORAL | Status: DC
Start: 2017-03-18 — End: 2017-03-18

## 2017-03-18 MED ORDER — OXYCODONE HCL 5 MG PO TABS
5.00 | ORAL_TABLET | ORAL | Status: DC
Start: ? — End: 2017-03-18

## 2017-03-19 DIAGNOSIS — C7651 Malignant neoplasm of right lower limb: Secondary | ICD-10-CM | POA: Diagnosis not present

## 2017-03-19 DIAGNOSIS — R262 Difficulty in walking, not elsewhere classified: Secondary | ICD-10-CM | POA: Diagnosis not present

## 2017-03-19 DIAGNOSIS — R2681 Unsteadiness on feet: Secondary | ICD-10-CM | POA: Diagnosis not present

## 2017-03-19 DIAGNOSIS — R2689 Other abnormalities of gait and mobility: Secondary | ICD-10-CM | POA: Diagnosis not present

## 2017-03-19 DIAGNOSIS — Z483 Aftercare following surgery for neoplasm: Secondary | ICD-10-CM | POA: Diagnosis not present

## 2017-03-19 DIAGNOSIS — R278 Other lack of coordination: Secondary | ICD-10-CM | POA: Diagnosis not present

## 2017-03-19 DIAGNOSIS — C499 Malignant neoplasm of connective and soft tissue, unspecified: Secondary | ICD-10-CM | POA: Diagnosis not present

## 2017-03-20 DIAGNOSIS — R2689 Other abnormalities of gait and mobility: Secondary | ICD-10-CM | POA: Diagnosis not present

## 2017-03-20 DIAGNOSIS — C499 Malignant neoplasm of connective and soft tissue, unspecified: Secondary | ICD-10-CM | POA: Diagnosis not present

## 2017-03-20 DIAGNOSIS — R262 Difficulty in walking, not elsewhere classified: Secondary | ICD-10-CM | POA: Diagnosis not present

## 2017-03-20 DIAGNOSIS — R278 Other lack of coordination: Secondary | ICD-10-CM | POA: Diagnosis not present

## 2017-03-20 DIAGNOSIS — C7651 Malignant neoplasm of right lower limb: Secondary | ICD-10-CM | POA: Diagnosis not present

## 2017-03-20 DIAGNOSIS — R2681 Unsteadiness on feet: Secondary | ICD-10-CM | POA: Diagnosis not present

## 2017-03-21 ENCOUNTER — Encounter: Payer: Self-pay | Admitting: Internal Medicine

## 2017-03-21 ENCOUNTER — Non-Acute Institutional Stay (SKILLED_NURSING_FACILITY): Payer: Medicare Other | Admitting: Internal Medicine

## 2017-03-21 DIAGNOSIS — G4733 Obstructive sleep apnea (adult) (pediatric): Secondary | ICD-10-CM | POA: Diagnosis not present

## 2017-03-21 DIAGNOSIS — Z8546 Personal history of malignant neoplasm of prostate: Secondary | ICD-10-CM | POA: Diagnosis not present

## 2017-03-21 DIAGNOSIS — C7651 Malignant neoplasm of right lower limb: Secondary | ICD-10-CM | POA: Diagnosis not present

## 2017-03-21 DIAGNOSIS — R2681 Unsteadiness on feet: Secondary | ICD-10-CM | POA: Diagnosis not present

## 2017-03-21 DIAGNOSIS — C4921 Malignant neoplasm of connective and soft tissue of right lower limb, including hip: Secondary | ICD-10-CM | POA: Diagnosis not present

## 2017-03-21 DIAGNOSIS — R3914 Feeling of incomplete bladder emptying: Secondary | ICD-10-CM | POA: Diagnosis not present

## 2017-03-21 DIAGNOSIS — I10 Essential (primary) hypertension: Secondary | ICD-10-CM

## 2017-03-21 DIAGNOSIS — N183 Chronic kidney disease, stage 3 unspecified: Secondary | ICD-10-CM | POA: Insufficient documentation

## 2017-03-21 DIAGNOSIS — E034 Atrophy of thyroid (acquired): Secondary | ICD-10-CM | POA: Diagnosis not present

## 2017-03-21 DIAGNOSIS — M316 Other giant cell arteritis: Secondary | ICD-10-CM

## 2017-03-21 DIAGNOSIS — R262 Difficulty in walking, not elsewhere classified: Secondary | ICD-10-CM | POA: Diagnosis not present

## 2017-03-21 DIAGNOSIS — N3020 Other chronic cystitis without hematuria: Secondary | ICD-10-CM | POA: Diagnosis not present

## 2017-03-21 DIAGNOSIS — F324 Major depressive disorder, single episode, in partial remission: Secondary | ICD-10-CM

## 2017-03-21 DIAGNOSIS — R2689 Other abnormalities of gait and mobility: Secondary | ICD-10-CM | POA: Diagnosis not present

## 2017-03-21 DIAGNOSIS — T86829 Unspecified complication of skin graft (allograft) (autograft): Secondary | ICD-10-CM | POA: Diagnosis not present

## 2017-03-21 DIAGNOSIS — C499 Malignant neoplasm of connective and soft tissue, unspecified: Secondary | ICD-10-CM | POA: Diagnosis not present

## 2017-03-21 DIAGNOSIS — R278 Other lack of coordination: Secondary | ICD-10-CM | POA: Diagnosis not present

## 2017-03-21 NOTE — Progress Notes (Signed)
Patient ID: Philip [name], male   DOB: 12-22-1932, 84 y.o.   MRN: [id number]  Provider:  Jonelle Sidle L. Mariea Clonts, D.O., C.M.D. Location:  [facility] Room Number: [place] of Service:  SNF (31)  PCP: Gayland Curry, DO Patient Care Team: Gayland Curry, DO as PCP - General (Geriatric Medicine) Bjorn Loser, MD as Consulting Physician (Urology) [facility] Brizel, Youlanda Roys, MD (Radiation Oncology) Jacelyn Pi, MD as Consulting Physician (Endocrinology)  Extended Emergency Contact Information Primary Emergency Contact: Greenman,Roberta Address: [address]          [place], [place] [address] Johnnette Litter of [place] Phone: [phone] Mobile Phone: [phone] Relation: Spouse Secondary Emergency Contact: Bjorn Loser, [place] [address] Johnnette Litter of [place] Phone: [phone] Mobile Phone: [phone] Relation: Daughter  Code Status: DNR  Goals of Care: Advanced Directive information Advanced Directives 03/21/2017  Does Patient Have a Medical Advance Directive? Yes  Type of Paramedic of [place];Out of facility DNR (pink MOST or yellow form)  Does patient want to make changes to medical advance directive? No - Patient declined  Copy of [place] in Chart? Yes  Would patient like information on creating a medical advance directive? -  Pre-existing out of facility DNR order (yellow form or pink MOST form) Yellow form placed in chart (order not valid for inpatient use)   Chief Complaint  Patient presents with  . New Admit To SNF    Rehab admission    HPI: [place] a 84 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  There are plans for adjuvant XRT and he's getting hyperbaric therapy at [facility].  He was screened for metastatic disease and CT chest showed a lung nodule of the LUL of 0.3cm up from 0.1cm in 2014, but not felt to be mets.  He's also recently had LLE squamous cell ca s/p mohs surgery.   History of sarcoma dx:  He had seen me, an orthopedist, his rheumatologist and a physical therapist about his leg and no DVT was found and he was felt not to have any flare of his aortitis. Finally, due to ongoing and worsening pain, I ordered an MRI that never got done due to communication issues between the imaging center and the patient.  Finally, pt wound up seeing Dr. Barbaraann Barthel per recommendation of a PT.  He ordered the same MRI and pt got it done 02/09/17 and it showed a soft tissue sarcoma of his right upper anterior muscular compartment.  He referred him to [facility] for surgery and XRT.  Pt has sutures in place with plans for removal on 3/12.  He has a dressing in place that's to remain for 7 days and pt requests to see his wound today.    He continues to take numerous supplements that he's researched on his own and prefers these over traditional medical therapy.  He's not been compliant with daily lisinopril therapy for his bp  and takes an unusual thyroid replacement therapy rather than synthroid or armour thyroid at a dose of 10mg  daily.  He does NOT take levothyroxine which was listed on his [facility] paperwork.  He has only come to see me when he's had concerns, not for regular visits and hasn't had a recent TSH or free t4 or t3 checked.    He has urinary retention and does self-catheterization.  He's followed by urology.  He has had a stricture and catheter  could not be placed by nursing in the hospital and urology had to get involved.  He now has a foley in place with a leg bag.  There are plans for him to see Dr. McDiarmid in the coming weeks in f/u.  He's had several different recent ophthalmologic issues, as well, recently including some diplopia and macular degeneration.  Past Medical History:  Diagnosis Date  . Aneurysm of left renal artery (HCC)    per CT 08-20-2015 stable 76mm  . Aortitis syndrome ([place]) rheumotologist-  dr Charlestine Night   IgG4 syndrome-- treatment prednisone- (effects abdomine)  . Bladder neck contracture   . BPH (benign prostatic hyperplasia)   . CKD (chronic kidney disease), stage III ([place])   . DDD (degenerative disc disease)   . Full dentures   . GERD (gastroesophageal reflux disease)   . History of prostate cancer DX  2002   S/P EXTERNAL RADIATION/ RADIOACTIVE SEED IMPLANTS  2003  . History of squamous cell carcinoma excision    2012--  RIGHT LOWER EXTREM  . HTN (hypertension)   . Hyperlipemia   . Hypothyroidism   . Large vessel vasculitis (HCC)    aortitis--- rheumotologist-  dr Charlestine Night  . Left renal artery stenosis (HCC)    proximal per CT 08-20-2015 but patent  . Left renal atrophy   . Macular degeneration   . Nocturia 08/08/2011  . OSA (obstructive sleep apnea) intolerant cpap  . Renal cyst, right   . Restless legs syndrome (RLS) 10/31/2011  . S/P radiation therapy ended 01-26-2012    for tonsillar cancer (head and neck) /  and external beam radiation for prostate cancer 2002  . Saliva decreased   . Self-catheterizes urinary bladder    QID   AND   PRN  . Tonsillar cancer ([place]) 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+) (Stage IVA)---- dx oct 2013  ----s/p concurent chemo and radiation/  ended 01-26-2012-- no surgical intervention---  residuals ( dry mouth, decreased saliva)   oncologist at [facility]--  dr brizel--  HX OF -- NO RECURRENCE  . Urethral stricture    chronic  .  Urinary retention with incomplete bladder emptying   . Vitamin B 12 deficiency 01/28/2011  . Vitamin D deficiency    Past Surgical History:  Procedure Laterality Date  . BALLOON DILATION N/A 11/10/2014   Procedure: CYSTO BALLOON DILATION AND RETROGRADE URETHROGRAM ;  Surgeon: Bjorn Loser, MD;  Location[facility];  Service: Urology;  Laterality: N/A;  . CATARACT EXTRACTION W/ INTRAOCULAR LENS  IMPLANT, BILATERAL    . CYSTO/ BALLOON DILATION OF  URETHRAL STRICTURE  12-25-2010  . CYSTOSCOPY WITH RETROGRADE URETHROGRAM N/A 03/30/2016   Procedure: CYSTOSCOPY WITH RETROGRADE URETHROGRAM AND BALLOON DILATION with cystogram;  Surgeon: Bjorn Loser, MD;  Location: [facility];  Service: Urology;  Laterality: N/A;  . CYSTOSCOPY WITH URETHRAL DILATATION  05/31/2011   Procedure: CYSTOSCOPY WITH URETHRAL DILATATION;  Surgeon: Reece Packer, MD;  Location: [facility];  Service: Urology;  Laterality: N/A;  BALLOON DILATION  . CYSTOSCOPY WITH URETHRAL DILATATION N/A 09/13/2016   Procedure: CYSTOSCOPY WITH URETHRAL BALLOON DILATATION;  Surgeon: Bjorn Loser, MD;  Location: [facility];  Service: Urology;  Laterality: N/A;  . CYSTOSCOPY/RETROGRADE/URETEROSCOPY N/A 05/22/2012   Procedure: CYSTOSCOPY BALLOON DILATION RETROGRADE URETEROGRAM ;  Surgeon: Reece Packer, MD;  Location: [facility];  Service: Urology;  Laterality: N/A;  . CYTSO/  DILATATION URETHRAL STRICTURE/  BX PROSTATIC URETHRA/  REMOVAL FOREIGN BODIES  06-25-2010   [facility]  . INGUINAL HERNIA REPAIR Right 2000  . RADIOACTIVE SEED IMPLANTS, PROSTATE  JAN  2003  . SKIN LESION EXCISION  07/2012   MOST  right shoulder    reports that  has never smoked. he has never used smokeless tobacco. He reports that he drinks about 4.2 oz of alcohol per week. He reports that he does not use drugs. Social History   Socioeconomic History  . Marital status: Married     Spouse name: Not on file  . Number of children: 4  . Years of education: college  . Highest education level: Not on file  Social Needs  . Financial resource strain: Not on file  . Food insecurity - worry: Not on file  . Food insecurity - inability: Not on file  . Transportation needs - medical: Not on file  . Transportation needs - non-medical: Not on file  Occupational History  . Occupation: retired  Tobacco Use  . Smoking status: Never Smoker  . Smokeless tobacco: Never Used  Substance and Sexual Activity  . Alcohol use: Yes    Alcohol/week: 4.2 oz    Types: 7 Glasses of wine per week    Comment: 8oz, glass or red wine  . Drug use: No  . Sexual activity: Not on file  Other Topics Concern  . 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     Functional Status Survey:    Family History  Problem Relation Age of Onset  . Stroke Father     Health Maintenance  Topic Date Due  . TETANUS/TDAP  01/17/2013  . INFLUENZA VACCINE  08/17/2016  . PNA vac Low Risk Adult  Completed    Allergies  Allergen Reactions  . Nsaids Other (See Comments)    Has been told no NSAIDs, Ibuprofen, CT or MRI contrast per pt's family. Renal insufficiency hx.   . Cephalosporins Rash  . Suprax [Cefixime] Rash    Outpatient Encounter Medications as of 03/21/2017  Medication Sig  . buPROPion (WELLBUTRIN) 100 MG tablet TAKE 1 TABLET BY MOUTH TWICE DAILY  . cholecalciferol (VITAMIN D) 1000 units tablet Take 2,000 Units by mouth daily.  . Coenzyme Q10 (COQ10) 100 MG CAPS Take 100 mg by mouth daily. Take one daily  . Cyanocobalamin-Methylcobalamin 600-600 MCG SUBL Place under the tongue daily.  Marland Kitchen DIGESTIVE ENZYMES PO Take 500 mg by mouth every morning.   . enoxaparin (LOVENOX) 40 MG/0.4ML injection Inject into the skin.  . fish oil-omega-3 fatty acids 1000 MG capsule Take 2 g by mouth daily.    Marland Kitchen GLUCOSAMINE-CHONDROITIN-MSM PO Take 500 mg by mouth 2 (two) times daily.   Marland Kitchen lisinopril (PRINIVIL,ZESTRIL) 10 MG tablet TAKE ONE TABLET BY MOUTH ONCE DAILY  . Multiple Vitamins-Minerals (MULTIVITAMINS THER. W/MINERALS) TABS Take 1 tablet by mouth daily.   Marland Kitchen  NP THYROID PO Take 10 mg by mouth daily.  . Quercetin Dihydrate POWD Take by mouth daily.  Marland Kitchen RESVERATROL PO Take 100 mg by mouth 2 (two) times daily.   . TURMERIC CURCUMIN PO Take 650 mg by mouth every morning.  Marland Kitchen UNABLE TO FIND [place] Gems take once daily  . UNABLE TO FIND [place] [employer] take once daily  . UNABLE TO FIND [place] [employer]ics take once daily  . [DISCONTINUED] levothyroxine (SYNTHROID, LEVOTHROID) 100 MCG tablet Take 100 mcg by mouth daily before breakfast.  . [DISCONTINUED] Ascorbic Acid (VITAMIN C) 1000 MG tablet Take 1,000 mg by mouth daily.  . [DISCONTINUED] ASHWAGANDHA PO Take by mouth daily.  . [DISCONTINUED] CALCIUM PO Take 800 mg by mouth daily.   . [DISCONTINUED] Cholecalciferol (VITAMIN D) 2000 units CAPS Take by mouth daily.  . [DISCONTINUED] COLLAGEN PO Take 600 mg by mouth 2 (two) times daily.   . [DISCONTINUED] cyanocobalamin 100 MCG tablet Take 100 mcg by mouth daily.  . [DISCONTINUED] Hyaluronic Acid-Vitamin C (HYALURONIC ACID PO) Take by mouth 2 (two) times daily.   . [DISCONTINUED] Menaquinone-7 (VITAMIN K2 PO) Take by mouth daily.  . [DISCONTINUED] NP THYROID 90 MG tablet Take 90 mg by mouth every morning.   . [DISCONTINUED] vitamin E (NATURAL VITAMIN E) 400 UNIT capsule Take 400 Units by mouth daily.   No facility-administered encounter medications on file as of 03/21/2017.     Review of Systems  Constitutional: Negative for activity change, appetite change, chills, fatigue and fever.  HENT: Negative for congestion.   Respiratory: Negative for chest tightness and shortness of breath.   Cardiovascular: Negative for chest pain, palpitations and leg swelling.  Gastrointestinal: Negative for  abdominal pain, blood in stool, constipation and diarrhea.  Genitourinary: Negative for dysuria.  Musculoskeletal: Negative for arthralgias.       Foot drop  Neurological: Negative for dizziness and weakness.  Hematological: Does not bruise/bleed easily.  Psychiatric/Behavioral: Negative for agitation and confusion.    Vitals:   03/21/17 0923  BP: 116/70  Pulse: 77  Resp: 19  Temp: 98.2 F (36.8 C)  TempSrc: Oral  SpO2: 100%  Weight: 166 lb (75.3 kg)  Height: 5\' 11"  (1.803 m)   Body mass index is 23.15 kg/m. Physical Exam  Constitutional: He is oriented to person, place, and time. He appears well-developed and well-nourished. No distress.  HENT:  Head: Normocephalic and atraumatic.  Right Ear: External ear normal.  Left Ear: External ear normal.  Nose: Nose normal.  Mouth/Throat: Oropharynx is clear and moist. No oropharyngeal exudate.  Eyes:  glasses  Cardiovascular: Normal rate, regular rhythm, normal heart sounds and intact distal pulses.  Pulmonary/Chest: Effort normal and breath sounds normal. No respiratory distress.  Abdominal: Soft. Bowel sounds are normal. He exhibits no distension. There is no tenderness.  Musculoskeletal: Normal range of motion.  Slight limp due to recent surgery  Neurological: He is alert and oriented to person, place, and time. No cranial nerve deficit.  Mild right foot drop  Skin:  Right lower leg with long incision site, well approximated, only one spot still bleeding, staples intact, no surrounding erythema, warmth, swelling   Psychiatric: He has a normal mood and affect.    Labs reviewed: Basic Metabolic Panel: Recent Labs    03/30/16 0801 09/13/16 1215 02/07/17 1441  NA 140 143 143  K 4.4 3.9 4.8  CL 103 104 102  CO2  --   --  26  GLUCOSE 94 95  89  BUN 26* 27* 23  CREATININE 1.40* 1.70* 1.50*  CALCIUM  --   --  9.1   Liver Function Tests: No results for input(s): AST, ALT, ALKPHOS, BILITOT, PROT, ALBUMIN in the last 8760  hours. No results for input(s): LIPASE, AMYLASE in the last 8760 hours. No results for input(s): AMMONIA in the last 8760 hours. CBC: Recent Labs    03/30/16 0801 09/13/16 1215  HGB 13.3 12.2*  HCT 39.0 36.0*   Cardiac Enzymes: No results for input(s): CKTOTAL, CKMB, CKMBINDEX, TROPONINI in the last 8760 hours. BNP: Invalid input(s): POCBNP No results found for: HGBA1C Lab Results  Component Value Date   TSH 0.57 05/12/2015   No results found for: VITAMINB12 No results found for: FOLATE No results found for: IRON, TIBC, FERRITIN  Imaging and Procedures obtained prior to SNF admission: See hpi for MRI report results, [facility] records reviewed in care everywhere and paper chart at [facility]  Assessment/Plan 1. Sarcoma of lower extremity, right ([place]) -s/p resection -for f/u XRT txs -was told he did not need further hyperbaric therapy due to good healing of his wound so far  2. Complication of skin graft, unspecified complication -per [facility] record -appears to be healing well, only one small spot bleeding -keep f/u appt with surgeon for staple removal and f/u  3. OSA (obstructive sleep apnea) -ongoing, using CPAP now as directed and reports no improvement, but seems much more alert and less drowsy than he used to be  4. Hypothyroidism due to acquired atrophy of thyroid -check TSH, free t3 and free t4 today due to unusual thyroid medication he insists upon taking instead of levothyroxine,may need dose adjustment  5. History of prostate cancer -prior XRT had some detrimental effects on his urethra--currently requiring catheter and his wife mentions that he may need a urostomy   6. Large vessel vasculitis (HCC) -aortitis, seems this is not active, responded to prednisone--had presented as some abdominal pain   7. Major depressive disorder with single episode, in partial remission ([place]) -continues on wellbutrin therapy--I'm not sure how faithful he is with any of his prescription med,  prefers herbals  8. Essential hypertension -bp well controlled when actually taking his lisinopril  9. Chronic kidney disease, stage 3 (HCC) -ongoing, helped by ace, Avoid nephrotoxic agents like nsaids, dose adjust renally excreted meds, hydrate.  Family/ staff Communication: discussed with pt, rehab nursing, wife; [facility] records reviewed  Labs/tests ordered:  TSH, free T4 and free T3--need results prior to considering d/c home  Sanjeev Main L. Raphael Espe, D.O. [facility] [address], [place] [address] Cell Phone (Mon-Fri 8am-5pm):  [phone] On Call:  [phone] & follow prompts after 5pm & weekends Office Phone:  [phone] Office Fax:  [phone]

## 2017-03-22 ENCOUNTER — Other Ambulatory Visit: Payer: Self-pay | Admitting: Urology

## 2017-03-22 DIAGNOSIS — E039 Hypothyroidism, unspecified: Secondary | ICD-10-CM | POA: Diagnosis not present

## 2017-03-22 DIAGNOSIS — D464 Refractory anemia, unspecified: Secondary | ICD-10-CM | POA: Diagnosis not present

## 2017-03-22 DIAGNOSIS — C7651 Malignant neoplasm of right lower limb: Secondary | ICD-10-CM | POA: Diagnosis not present

## 2017-03-22 DIAGNOSIS — C499 Malignant neoplasm of connective and soft tissue, unspecified: Secondary | ICD-10-CM | POA: Diagnosis not present

## 2017-03-22 DIAGNOSIS — D508 Other iron deficiency anemias: Secondary | ICD-10-CM | POA: Diagnosis not present

## 2017-03-22 DIAGNOSIS — R262 Difficulty in walking, not elsewhere classified: Secondary | ICD-10-CM | POA: Diagnosis not present

## 2017-03-22 DIAGNOSIS — R2689 Other abnormalities of gait and mobility: Secondary | ICD-10-CM | POA: Diagnosis not present

## 2017-03-22 DIAGNOSIS — R278 Other lack of coordination: Secondary | ICD-10-CM | POA: Diagnosis not present

## 2017-03-22 DIAGNOSIS — R2681 Unsteadiness on feet: Secondary | ICD-10-CM | POA: Diagnosis not present

## 2017-03-22 DIAGNOSIS — D649 Anemia, unspecified: Secondary | ICD-10-CM | POA: Diagnosis not present

## 2017-03-22 DIAGNOSIS — M2550 Pain in unspecified joint: Secondary | ICD-10-CM | POA: Diagnosis not present

## 2017-03-22 LAB — CBC AND DIFFERENTIAL
HEMATOCRIT: 40 — AB (ref 41–53)
Hemoglobin: 13.2 — AB (ref 13.5–17.5)
PLATELETS: 297 (ref 150–399)
WBC: 6.4

## 2017-03-22 LAB — HEPATIC FUNCTION PANEL
ALT: 20 (ref 10–40)
AST: 18 (ref 14–40)
Alkaline Phosphatase: 65 (ref 25–125)
BILIRUBIN, TOTAL: 0.3

## 2017-03-22 LAB — BASIC METABOLIC PANEL
BUN: 21 (ref 4–21)
Creatinine: 1.5 — AB (ref 0.6–1.3)
GLUCOSE: 93
POTASSIUM: 4.8 (ref 3.4–5.3)
Sodium: 142 (ref 137–147)

## 2017-03-22 LAB — TSH: TSH: 2.65 (ref 0.41–5.90)

## 2017-03-22 LAB — POCT ERYTHROCYTE SEDIMENTATION RATE, NON-AUTOMATED: SED RATE: 78

## 2017-03-23 ENCOUNTER — Other Ambulatory Visit: Payer: Self-pay | Admitting: Urology

## 2017-03-23 ENCOUNTER — Encounter (HOSPITAL_BASED_OUTPATIENT_CLINIC_OR_DEPARTMENT_OTHER): Payer: Self-pay | Admitting: *Deleted

## 2017-03-24 ENCOUNTER — Other Ambulatory Visit: Payer: Self-pay

## 2017-03-24 ENCOUNTER — Encounter (HOSPITAL_BASED_OUTPATIENT_CLINIC_OR_DEPARTMENT_OTHER): Payer: Self-pay | Admitting: *Deleted

## 2017-03-24 ENCOUNTER — Other Ambulatory Visit: Payer: Self-pay | Admitting: Urology

## 2017-03-24 NOTE — Progress Notes (Signed)
SPOKE W/ PT VIA PHONE FOR PRE-OP INTERVIEW. NPO AFTER MN.  ARRIVE AT 0530.  NEEDS ISTAT.  CURRENT IN CHART AND Epic DATED 03-30-2017 AND ANOTHER EKG IN CARE EVERYWHERE DATED 02-28-2017 ([facility]).  WILL TAKE WELLBUTRIN AM DOS W/ SIPS OF WATER.  PT IS ON LOVENOX INJECTION POST SURGERY 03-15-2017, DOSE IN EVENING.

## 2017-03-28 DIAGNOSIS — C4921 Malignant neoplasm of connective and soft tissue of right lower limb, including hip: Secondary | ICD-10-CM | POA: Diagnosis not present

## 2017-03-28 DIAGNOSIS — E039 Hypothyroidism, unspecified: Secondary | ICD-10-CM | POA: Diagnosis not present

## 2017-03-29 NOTE — H&P (Signed)
Last visit  I reviewed the operative note in detail in March 2018. I think it is prudent to schedule him for the same procedure. He does have a very tenuous situation. I must say him almost surprised at times after the dilation that he does so well.   We had a lengthy discussion again about her disease. We talked about inability cure his problem. He has been radiated. He understands the development of balloon dilation now that he would not be a candidate for the study. He understands that he is almost a bladder cripple and would need a suprapubic tube or divergent if the procedure was not successful. Hopeful that this will work and that he is willing to continue to catheterize which is wonderful.   Today  The patient had balloon dilation a number of weeks ago at end of August/2018. By telephone and he was catheterizing very well.  Soon after catheterizing the catheter went in exceptionally easy. It now go straight in with the slightest of resistance and he is pleased. He clinically is not infected   He has stable erectile dysfunction and wanted to try the Viagra 20 mg generic tablets and protocol was instituted. Contraindications discussed. He's had headaches in the past but thinks he can tolerate perhaps the lower dosages.   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 He currently has a lower leg that is wrapped. He was catheterized and beautifully with his last dilation last August 4 times a day but then developed inability to catheterize. I read the [facility] note and at the bedside the scope noted the membranous urethral stricture. Over a Sensor wire semi rigid dilators to 18 [place] were utilized. Sixteen French Councill catheter was inserted.   He currently has a well during 67 French Councill catheter   It is been  about 8 months and I think the best thing to do is do recommend when he would like is to remove the Foley catheter under anesthesia and dilate him again.   We agreed to the above plan. I will send the urine for culture. I gave him a 7 day prescription of ciprofloxacin 250 mg twice a day to start 3 days prior. I will call if the culture defers. He is to start daily radiation treatment soon at [facility] and we would like to get him catheter free   There is no other aggravating or relieving factors  There is no other associated signs and symptoms  The severity of the symptoms is moderate  The symptoms are ongoing and bothersome      ALLERGIES: Suprax TABS    MEDICATIONS: Acetyl L-Carnitine CAPS Oral  Arginine TABS Oral  Bromelain TABS Oral  Bupropion Hcl 100 mg tablet Oral  Calcium TABS Oral  Chondroitin Sulfate CAPS Oral  Collagen CAPS Oral  CoQ-10 10 MG CAPS Oral  DHA Complete CAPS Oral  Digestive Enzymes Oral Tablet Oral  EPA Plus CAPS Oral  Glucosamine CAPS Oral  Hyaluronic Acid Powder Does Not Apply  L-Arginine TABS Oral  L-Citrulline CAPS Oral  Lisinopril 10 mg tablet Oral  Magnesium TABS Oral  MSM 1000 MG Oral Tablet Oral  Multi-Vitamin TABS Oral  Nature-Throid 180 mg tablet Oral  Nature-Throid 146.25 mg tablet Oral  Pepsin Powder Does Not Apply  Potassium Chloride Powder Does Not  Apply  Prednisone  Quercetin TABS Oral  Resveratrol 100 MG Oral Capsule Oral  Sildenafil 20 mg tablet 1 tablet PO prn patient to take 1-5 tablets 1 hour prior to sexual activity  Silica CAPS Oral  Strontium Chloride Crystals Does Not Apply  Vanadium CAPS Oral  Vitamin D CAPS Oral  Vitamin D3 CAPS Oral  Vitamin E CAPS Oral  Vitamin K TABS Oral     GU PSH: Catheterize For Residual - 03/23/2016 Cysto Dilate Stricture (M or F) - 09/13/2016, 03/30/2016, 11/14/2014, 2014, 2013, 2012 Cystoscopy - 03/29/2016      PSH Notes: Cystoscopy For Urethral Stricture, Cystoscopy For Urethral Stricture,  Cystoscopy For Urethral Stricture, Hernia Repair, Urethra Surgery, Cystoscopy For Urethral Stricture   NON-GU PSH: Hernia Repair - 2013    GU PMH: Incomplete bladder emptying - 03/23/2016 Urethral Stricture, Unspec, Urethral stricture - 11/19/2014 Chronic cystitis (w/o hematuria), Chronic cystitis - 2016 ED due to arterial insufficiency, Erectile dysfunction due to arterial insufficiency - 2016 History of prostate cancer, Prostate Cancer - 2014 Urinary Retention, Unspec, Incomplete bladder emptying - 2014 Urinary Tract Inf, Unspec site, Urinary tract infection - 2014      PMH Notes:  2012-05-07 14:57:00 - Note: Laryngeal Cancer   NON-GU PMH: Encounter for general adult medical examination without abnormal findings, Encounter for preventive health examination - 11/10/2014 Personal history of other diseases of the nervous system and sense organs, History of sleep apnea - 2014 Personal history of other mental and behavioral disorders, History of depression - 2014    FAMILY HISTORY: Class I Angina - Father, Sister Death In The Family Father - Father Death In The Family Mother - Mother Family Health Status Number - Runs In Family   SOCIAL HISTORY: None    Notes: Marital History - Currently Married, Occupation: Retired, Caffeine Use, Never A Smoker, Alcohol Use, Tobacco Use   REVIEW OF SYSTEMS:    GU Review Male:   Patient denies frequent urination, hard to postpone urination, burning/ pain with urination, get up at night to urinate, leakage of urine, stream starts and stops, trouble starting your stream, have to strain to urinate , erection problems, and penile pain.  Gastrointestinal (Upper):   Patient denies nausea, vomiting, and indigestion/ heartburn.  Gastrointestinal (Lower):   Patient denies diarrhea and constipation.  Constitutional:   Patient denies fever, night sweats, weight loss, and fatigue.  Skin:   Patient denies skin rash/ lesion and itching.  Eyes:   Patient denies  blurred vision and double vision.  Ears/ Nose/ Throat:   Patient denies sore throat and sinus problems.  Hematologic/Lymphatic:   Patient denies swollen glands and easy bruising.  Cardiovascular:   Patient denies leg swelling and chest pains.  Respiratory:   Patient denies cough and shortness of breath.  Endocrine:   Patient denies excessive thirst.  Musculoskeletal:   Patient denies back pain and joint pain.  Neurological:   Patient denies headaches and dizziness.  Psychologic:   Patient denies depression and anxiety.   VITAL SIGNS:      03/21/2017 03:23 PM  Weight 162 lb / 73.48 kg  Height 71 in / 180.34 cm  BP 134/82 mmHg  Pulse 67 /min  Temperature 97.9 F / 36.6 C  BMI 22.6 kg/m   PAST DATA REVIEWED:  Source Of History:  Patient   PROCEDURES:          Urinalysis w/Scope Dipstick Dipstick Cont'd Micro  Color: Yellow Bilirubin: Neg WBC/hpf: 10 - 20/hpf  Appearance: Clear Ketones:  Trace RBC/hpf: 3 - 10/hpf  Specific Gravity: 1.025 Blood: 3+ Bacteria: Few (10-25/hpf)  pH: 6.0 Protein: 3+ Cystals: NS (Not Seen)  Glucose: Neg Urobilinogen: 0.2 Casts: Hyaline    Nitrites: Neg Trichomonas: Not Present    Leukocyte Esterase: 2+ Mucous: Not Present      Epithelial Cells: NS (Not Seen)      Yeast: NS (Not Seen)      Sperm: Not Present   After a thorough review of the management options for the patient's condition the patient  elected to proceed with surgical therapy as noted above. We have discussed the potential benefits and risks of the procedure, side effects of the proposed treatment, the likelihood of the patient achieving the goals of the procedure, and any potential problems that might occur during the procedure or recuperation. Informed consent has been obtained.   ASSESSMENT:      ICD-10 Details  1 GU:   Chronic cystitis (w/o hematuria) - N30.20   2   Incomplete bladder emptying - R39.14      PLAN:            Medications New Meds: Cipro 250 mg tablet 1 tablet PO  BID   #14  0 Refill(s)            Orders Labs Urine Culture          Schedule Return Visit/Planned Activity: Return PRN - [facility]             Note: we will call them          Document Letter(s):  Created for Patient: Clinical Summary

## 2017-03-30 ENCOUNTER — Ambulatory Visit (HOSPITAL_BASED_OUTPATIENT_CLINIC_OR_DEPARTMENT_OTHER): Payer: Medicare Other | Admitting: Anesthesiology

## 2017-03-30 ENCOUNTER — Encounter (HOSPITAL_BASED_OUTPATIENT_CLINIC_OR_DEPARTMENT_OTHER)
Admission: RE | Disposition: A | Discharge status: Home / Self Care | Payer: Self-pay | Source: Ambulatory Visit | Attending: Urology

## 2017-03-30 ENCOUNTER — Encounter (HOSPITAL_BASED_OUTPATIENT_CLINIC_OR_DEPARTMENT_OTHER): Payer: Self-pay

## 2017-03-30 ENCOUNTER — Ambulatory Visit (HOSPITAL_BASED_OUTPATIENT_CLINIC_OR_DEPARTMENT_OTHER)
Admission: RE | Admit: 2017-03-30 | Discharge: 2017-03-30 | Disposition: A | Discharge status: Home / Self Care | Payer: Medicare Other | Source: Ambulatory Visit | Attending: Urology | Admitting: Urology

## 2017-03-30 DIAGNOSIS — N35919 Unspecified urethral stricture, male, unspecified site: Secondary | ICD-10-CM | POA: Insufficient documentation

## 2017-03-30 DIAGNOSIS — N183 Chronic kidney disease, stage 3 (moderate): Secondary | ICD-10-CM | POA: Diagnosis not present

## 2017-03-30 DIAGNOSIS — G4730 Sleep apnea, unspecified: Secondary | ICD-10-CM | POA: Diagnosis not present

## 2017-03-30 DIAGNOSIS — Z79899 Other long term (current) drug therapy: Secondary | ICD-10-CM | POA: Insufficient documentation

## 2017-03-30 DIAGNOSIS — R3914 Feeling of incomplete bladder emptying: Secondary | ICD-10-CM | POA: Diagnosis not present

## 2017-03-30 DIAGNOSIS — E039 Hypothyroidism, unspecified: Secondary | ICD-10-CM | POA: Diagnosis not present

## 2017-03-30 DIAGNOSIS — N35913 Unspecified membranous urethral stricture, male: Secondary | ICD-10-CM | POA: Diagnosis not present

## 2017-03-30 DIAGNOSIS — I10 Essential (primary) hypertension: Secondary | ICD-10-CM | POA: Insufficient documentation

## 2017-03-30 DIAGNOSIS — I129 Hypertensive chronic kidney disease with stage 1 through stage 4 chronic kidney disease, or unspecified chronic kidney disease: Secondary | ICD-10-CM | POA: Diagnosis not present

## 2017-03-30 HISTORY — DX: Dependence on other enabling machines and devices: Z99.89

## 2017-03-30 HISTORY — DX: Malignant neoplasm of connective and soft tissue of right lower limb, including hip: C49.21

## 2017-03-30 HISTORY — DX: Obstructive sleep apnea (adult) (pediatric): G47.33

## 2017-03-30 HISTORY — DX: Other complications of anesthesia, initial encounter: T88.59XA

## 2017-03-30 HISTORY — DX: Presence of urogenital implants: Z96.0

## 2017-03-30 HISTORY — DX: Solitary pulmonary nodule: R91.1

## 2017-03-30 HISTORY — DX: Adverse effect of unspecified anesthetic, initial encounter: T41.45XA

## 2017-03-30 HISTORY — PX: CYSTOSCOPY WITH URETHRAL DILATATION: SHX5125

## 2017-03-30 HISTORY — DX: Personal history of irradiation: Z92.3

## 2017-03-30 LAB — POCT I-STAT 4, (NA,K, GLUC, HGB,HCT)
Glucose, Bld: 94 mg/dL (ref 65–99)
HCT: 35 % — ABNORMAL LOW (ref 39.0–52.0)
HEMOGLOBIN: 11.9 g/dL — AB (ref 13.0–17.0)
Potassium: 3.9 mmol/L (ref 3.5–5.1)
Sodium: 144 mmol/L (ref 135–145)

## 2017-03-30 SURGERY — CYSTOSCOPY, WITH URETHRAL DILATION
Anesthesia: General

## 2017-03-30 MED ORDER — PHENYLEPHRINE 40 MCG/ML (10ML) SYRINGE FOR IV PUSH (FOR BLOOD PRESSURE SUPPORT)
PREFILLED_SYRINGE | INTRAVENOUS | Status: DC | PRN
  Administered 2017-03-30 (×2): 80 ug via INTRAVENOUS

## 2017-03-30 MED ORDER — SODIUM CHLORIDE 0.9 % IR SOLN
Status: DC | PRN
  Administered 2017-03-30: 3000 mL via INTRAVESICAL

## 2017-03-30 MED ORDER — FENTANYL CITRATE (PF) 100 MCG/2ML IJ SOLN
INTRAMUSCULAR | Status: DC | PRN
  Administered 2017-03-30: 50 ug via INTRAVENOUS

## 2017-03-30 MED ORDER — PROPOFOL 500 MG/50ML IV EMUL
INTRAVENOUS | Status: DC | PRN
  Administered 2017-03-30: 75 ug/kg/min via INTRAVENOUS

## 2017-03-30 MED ORDER — LIDOCAINE 2% (20 MG/ML) 5 ML SYRINGE
INTRAMUSCULAR | Status: AC
  Filled 2017-03-30: qty 5

## 2017-03-30 MED ORDER — PROPOFOL 10 MG/ML IV BOLUS
INTRAVENOUS | Status: AC
  Filled 2017-03-30: qty 40

## 2017-03-30 MED ORDER — PROMETHAZINE HCL 25 MG/ML IJ SOLN
6.2500 mg | INTRAMUSCULAR | Status: DC | PRN
  Filled 2017-03-30: qty 1

## 2017-03-30 MED ORDER — SODIUM CHLORIDE 0.9 % IV SOLN
INTRAVENOUS | Status: DC
  Administered 2017-03-30: 06:00:00 via INTRAVENOUS
  Filled 2017-03-30: qty 1000

## 2017-03-30 MED ORDER — FENTANYL CITRATE (PF) 100 MCG/2ML IJ SOLN
25.0000 ug | INTRAMUSCULAR | Status: DC | PRN
  Filled 2017-03-30: qty 1

## 2017-03-30 MED ORDER — PHENYLEPHRINE 40 MCG/ML (10ML) SYRINGE FOR IV PUSH (FOR BLOOD PRESSURE SUPPORT)
PREFILLED_SYRINGE | INTRAVENOUS | Status: AC
  Filled 2017-03-30: qty 10

## 2017-03-30 MED ORDER — VANCOMYCIN HCL IN DEXTROSE 1-5 GM/200ML-% IV SOLN
INTRAVENOUS | Status: AC
  Filled 2017-03-30: qty 200

## 2017-03-30 MED ORDER — PROPOFOL 10 MG/ML IV BOLUS
INTRAVENOUS | Status: DC | PRN
  Administered 2017-03-30: 40 mg via INTRAVENOUS

## 2017-03-30 MED ORDER — LIDOCAINE 2% (20 MG/ML) 5 ML SYRINGE
INTRAMUSCULAR | Status: DC | PRN
  Administered 2017-03-30: 80 mg via INTRAVENOUS

## 2017-03-30 MED ORDER — ONDANSETRON HCL 4 MG/2ML IJ SOLN
INTRAMUSCULAR | Status: DC | PRN
  Administered 2017-03-30: 4 mg via INTRAVENOUS

## 2017-03-30 MED ORDER — ONDANSETRON HCL 4 MG/2ML IJ SOLN
INTRAMUSCULAR | Status: AC
  Filled 2017-03-30: qty 2

## 2017-03-30 MED ORDER — FENTANYL CITRATE (PF) 100 MCG/2ML IJ SOLN
INTRAMUSCULAR | Status: AC
  Filled 2017-03-30: qty 2

## 2017-03-30 MED ORDER — VANCOMYCIN HCL IN DEXTROSE 1-5 GM/200ML-% IV SOLN
1000.0000 mg | Freq: Once | INTRAVENOUS | Status: AC
  Administered 2017-03-30: 1000 mg via INTRAVENOUS
  Filled 2017-03-30: qty 200

## 2017-03-30 SURGICAL SUPPLY — 23 items
BAG DRAIN URO-CYSTO SKYTR STRL (DRAIN) ×3 IMPLANT
BALLN NEPHROSTOMY (BALLOONS) ×3
BALLOON NEPHROSTOMY (BALLOONS) ×1 IMPLANT
CATH FOLEY 2W COUNCIL 5CC 16FR (CATHETERS) ×3 IMPLANT
CATH ROBINSON RED A/P 14FR (CATHETERS) IMPLANT
CATH URET 5FR 28IN CONE TIP (BALLOONS)
CATH URET 5FR 70CM CONE TIP (BALLOONS) IMPLANT
CLOTH BEACON ORANGE TIMEOUT ST (SAFETY) ×6 IMPLANT
GLOVE BIO SURGEON STRL SZ7.5 (GLOVE) ×3 IMPLANT
GOWN STRL REUS W/ TWL LRG LVL3 (GOWN DISPOSABLE) ×1 IMPLANT
GOWN STRL REUS W/ TWL XL LVL3 (GOWN DISPOSABLE) ×1 IMPLANT
GOWN STRL REUS W/TWL LRG LVL3 (GOWN DISPOSABLE) ×2
GOWN STRL REUS W/TWL XL LVL3 (GOWN DISPOSABLE) ×2
GUIDEWIRE STR DUAL SENSOR (WIRE) ×3 IMPLANT
HOLDER FOLEY CATH W/STRAP (MISCELLANEOUS) ×3 IMPLANT
KIT TURNOVER CYSTO (KITS) ×3 IMPLANT
MANIFOLD NEPTUNE II (INSTRUMENTS) IMPLANT
NS IRRIG 500ML POUR BTL (IV SOLUTION) IMPLANT
PACK CYSTO (CUSTOM PROCEDURE TRAY) ×3 IMPLANT
SYRINGE IRR TOOMEY STRL 70CC (SYRINGE) IMPLANT
TUBE CONNECTING 12'X1/4 (SUCTIONS) ×1
TUBE CONNECTING 12X1/4 (SUCTIONS) ×2 IMPLANT
WATER STERILE IRR 3000ML UROMA (IV SOLUTION) ×3 IMPLANT

## 2017-03-30 NOTE — Transfer of Care (Signed)
Immediate Anesthesia Transfer of Care Note  Patient: Philip [name]  Procedure(s) Performed: CYSTOSCOPY WITH BALLOON URETHRAL DILATATION (N/A )  Patient Location: PACU  Anesthesia Type:MAC  Level of Consciousness: awake, alert  and oriented  Airway & Oxygen Therapy: Patient Spontanous Breathing and Patient connected to nasal cannula oxygen  Post-op Assessment: Report given to RN  Post vital signs: Reviewed and stable  Last Vitals:  Vitals:   03/30/17 0539 03/30/17 0812  BP: 134/72 (!) 101/56  Pulse: 65 (!) 45  Resp: 16 14  Temp: 36.6 C 36.6 C  SpO2: 99% 99%    Last Pain:  Vitals:   03/30/17 0539  TempSrc: Oral      Patients Stated Pain Goal: 3 (34/19/62 2297)  Complications: No apparent anesthesia complications

## 2017-03-30 NOTE — Anesthesia Postprocedure Evaluation (Signed)
Anesthesia Post Note  Patient: Sanders Manninen Mcgahee  Procedure(s) Performed: CYSTOSCOPY WITH BALLOON URETHRAL DILATATION (N/A )     Patient location during evaluation: PACU Anesthesia Type: MAC Level of consciousness: awake and alert Pain management: pain level controlled Vital Signs Assessment: post-procedure vital signs reviewed and stable Respiratory status: spontaneous breathing, nonlabored ventilation, respiratory function stable and patient connected to nasal cannula oxygen Cardiovascular status: stable and blood pressure returned to baseline Postop Assessment: no apparent nausea or vomiting Anesthetic complications: no    Last Vitals:  Vitals:   03/30/17 0815 03/30/17 0830  BP: 102/62 107/65  Pulse: (!) 45 (!) 44  Resp: 15 (!) 8  Temp:    SpO2: 100% 100%    Last Pain:  Vitals:   03/30/17 0539  TempSrc: Oral                 Rechy Bost S

## 2017-03-30 NOTE — Op Note (Signed)
Preoperative diagnosis: Urethral stricture Postoperative diagnosis: Urethral stricture Surgery: Cystoscopy and balloon dilation of urethral stricture and insertion of urethral catheter Surgeon: Dr. Nicki Reaper Antero Derosia  The patient has the above diagnosis and consented to the above procedure.  He had a Foley catheter in place.  He was covered appropriately with antibiotics.  Catheter was removed.  I cystoscoped the patient with a 72 French cystoscope.  The penile bulbar urethra normal.  He had a mild stricture in the members urethra.  He had a large false passage at 6:00 as noted previously.  He had bilobar enlargement of the prostate.  Bladder neck was open.  He grade 2-4 bladder trabeculation and no obvious significant cystitis  Sensor wire was easily passed into the bladder cystoscopically and under fluoroscopic guidance.  Well-prepared balloon dilation catheter was passed to the appropriate level.  It was balloon dilated for 5 minutes at 18 atm of pressure.  Balloon was deflated and removed  Re-cystoscoped the patient and the findings were as above with an open stricture at the level of the membranous urethra.  There was less rigidity of tissue this time.  26 French also catheter was easily applied and the wire was removed.  Urine draining was clear  Patient will be followed as per protocol

## 2017-03-30 NOTE — Interval H&P Note (Signed)
History and Physical Interval Note:  03/30/2017 7:17 AM  Philip [name]  has presented today for surgery, with the diagnosis of urethral stricture  The various methods of treatment have been discussed with the patient and family. After consideration of risks, benefits and other options for treatment, the patient has consented to  Procedure(s): CYSTOSCOPY WITH BALLOON URETHRAL DILATATION (N/A) as a surgical intervention .  The patient's history has been reviewed, patient examined, no change in status, stable for surgery.  I have reviewed the patient's chart and labs.  Questions were answered to the patient's satisfaction.     Eller Sweis A

## 2017-03-30 NOTE — Discharge Instructions (Signed)
I have reviewed discharge instructions in detail with the patient. They will follow-up with me or their physician as scheduled. My nurse will also be calling the patients as per protocol.    Post Anesthesia Home Care Instructions  Activity: Get plenty of rest for the remainder of the day. A responsible individual must stay with you for 24 hours following the procedure.  For the next 24 hours, DO NOT: -Drive a car -Paediatric nurse -Drink alcoholic beverages -Take any medication unless instructed by your physician -Make any legal decisions or sign important papers.  Meals: Start with liquid foods such as gelatin or soup. Progress to regular foods as tolerated. Avoid greasy, spicy, heavy foods. If nausea and/or vomiting occur, drink only clear liquids until the nausea and/or vomiting subsides. Call your physician if vomiting continues.  Special Instructions/Symptoms: Your throat may feel dry or sore from the anesthesia or the breathing tube placed in your throat during surgery. If this causes discomfort, gargle with warm salt water. The discomfort should disappear within 24 hours.  If you had a scopolamine patch placed behind your ear for the management of post- operative nausea and/or vomiting:  1. The medication in the patch is effective for 72 hours, after which it should be removed.  Wrap patch in a tissue and discard in the trash. Wash hands thoroughly with soap and water. 2. You may remove the patch earlier than 72 hours if you experience unpleasant side effects which may include dry mouth, dizziness or visual disturbances. 3. Avoid touching the patch. Wash your hands with soap and water after contact with the patch.   CYSTOSCOPY HOME CARE INSTRUCTIONS  Activity: Rest for the remainder of the day.  Do not drive or operate equipment today.  You may resume normal activities in one to two days as instructed by your physician.   Meals: Drink plenty of liquids and eat light foods such  as gelatin or soup this evening.  You may return to a normal meal plan tomorrow.  Return to Work: You may return to work in one to two days or as instructed by your physician.  Special Instructions / Symptoms: Call your physician if any of these symptoms occur:   -persistent or heavy bleeding  -bleeding which continues after first few urination  -large blood clots that are difficult to pass  -urine stream diminishes or stops completely  -fever equal to or higher than 101 degrees Farenheit.  -cloudy urine with a strong, foul odor  -severe pain  Females should always wipe from front to back after elimination.  You may feel some burning pain when you urinate.  This should disappear with time.  Applying moist heat to the lower abdomen or a hot tub bath may help relieve the pain. \  Follow-Up / Date of Return Visit to Your Physician:   Call for an appointment to arrange follow-up.  Patient Signature:  ________________________________________________________  Nurse's Signature:  ________________________________________________________

## 2017-03-30 NOTE — Anesthesia Procedure Notes (Signed)
Procedure Name: MAC Date/Time: 03/30/2017 7:34 AM Performed by: Bonney Aid, CRNA Pre-anesthesia Checklist: Patient identified, Timeout performed, Emergency Drugs available, Suction available and Patient being monitored Patient Re-evaluated:Patient Re-evaluated prior to induction Oxygen Delivery Method: Nasal cannula Placement Confirmation: positive ETCO2

## 2017-03-30 NOTE — Anesthesia Preprocedure Evaluation (Addendum)
Anesthesia Evaluation  Patient identified by MRN, date of birth, ID band Patient awake    Reviewed: Allergy & Precautions, NPO status , Patient's Chart, lab work & pertinent test results  Airway Mallampati: II  TM Distance: >3 FB Neck ROM: Full    Dental no notable dental hx.    Pulmonary sleep apnea and Continuous Positive Airway Pressure Ventilation ,    Pulmonary exam normal breath sounds clear to auscultation       Cardiovascular hypertension, Normal cardiovascular exam Rhythm:Regular Rate:Normal     Neuro/Psych negative neurological ROS  negative psych ROS   GI/Hepatic negative GI ROS, Neg liver ROS,   Endo/Other  Hypothyroidism   Renal/GU Renal InsufficiencyRenal disease  negative genitourinary   Musculoskeletal negative musculoskeletal ROS (+)   Abdominal   Peds negative pediatric ROS (+)  Hematology negative hematology ROS (+)   Anesthesia Other Findings   Reproductive/Obstetrics negative OB ROS                             Anesthesia Physical Anesthesia Plan  ASA: III  Anesthesia Plan: MAC   Post-op Pain Management:    Induction: Intravenous  PONV Risk Score and Plan: 1 and Ondansetron and Treatment may vary due to age or medical condition  Airway Management Planned: Simple Face Mask  Additional Equipment:   Intra-op Plan:   Post-operative Plan: Extubation in OR  Informed Consent: I have reviewed the patients History and Physical, chart, labs and discussed the procedure including the risks, benefits and alternatives for the proposed anesthesia with the patient or authorized representative who has indicated his/her understanding and acceptance.   Dental advisory given  Plan Discussed with: CRNA and Surgeon  Anesthesia Plan Comments:        Anesthesia Quick Evaluation

## 2017-03-31 ENCOUNTER — Encounter (HOSPITAL_BASED_OUTPATIENT_CLINIC_OR_DEPARTMENT_OTHER): Payer: Self-pay | Admitting: Urology

## 2017-04-02 NOTE — Op Note (Signed)
Preoperative diagnosis: Urethral stricture Postoperative diagnosis: Urethral stricture Surgery: Cystoscopy and balloon dilation of urethral stricture and insertion of urethral catheter Surgeon: Dr. Nicki Reaper Tyrene Nader  The patient has the above diagnosis and consented to the above procedure.  He had a Foley catheter in place.  He was covered appropriately with antibiotics.  Catheter was removed.  I cystoscoped the patient with a 32 French cystoscope.  The penile bulbar urethra normal.  He had a mild stricture in the members urethra.  He had a large false passage at 6:00 as noted previously.  He had bilobar enlargement of the prostate.  Bladder neck was open.  He grade 2-4 bladder trabeculation and no obvious significant cystitis  Sensor wire was easily passed into the bladder cystoscopically and under fluoroscopic guidance.  Well-prepared balloon dilation catheter was passed to the appropriate level.  It was balloon dilated for 5 minutes at 18 atm of pressure.  Balloon was deflated and removed  Re-cystoscoped the patient and the findings were as above with an open stricture at the level of the membranous urethra.  There was less rigidity of tissue this time.  64 French also catheter was easily applied and the wire was removed.  Urine draining was clear  Patient will be followed as per protocol

## 2017-04-04 DIAGNOSIS — I701 Atherosclerosis of renal artery: Secondary | ICD-10-CM | POA: Diagnosis not present

## 2017-04-04 DIAGNOSIS — C499 Malignant neoplasm of connective and soft tissue, unspecified: Secondary | ICD-10-CM | POA: Diagnosis not present

## 2017-04-04 DIAGNOSIS — M316 Other giant cell arteritis: Secondary | ICD-10-CM | POA: Diagnosis not present

## 2017-04-04 DIAGNOSIS — N183 Chronic kidney disease, stage 3 (moderate): Secondary | ICD-10-CM | POA: Diagnosis not present

## 2017-04-04 DIAGNOSIS — N320 Bladder-neck obstruction: Secondary | ICD-10-CM | POA: Diagnosis not present

## 2017-04-04 DIAGNOSIS — I129 Hypertensive chronic kidney disease with stage 1 through stage 4 chronic kidney disease, or unspecified chronic kidney disease: Secondary | ICD-10-CM | POA: Diagnosis not present

## 2017-04-05 DIAGNOSIS — C4921 Malignant neoplasm of connective and soft tissue of right lower limb, including hip: Secondary | ICD-10-CM | POA: Diagnosis not present

## 2017-04-10 DIAGNOSIS — I776 Arteritis, unspecified: Secondary | ICD-10-CM | POA: Diagnosis not present

## 2017-04-10 DIAGNOSIS — Z7952 Long term (current) use of systemic steroids: Secondary | ICD-10-CM | POA: Diagnosis not present

## 2017-04-12 DIAGNOSIS — C4921 Malignant neoplasm of connective and soft tissue of right lower limb, including hip: Secondary | ICD-10-CM | POA: Diagnosis not present

## 2017-04-24 DIAGNOSIS — C4921 Malignant neoplasm of connective and soft tissue of right lower limb, including hip: Secondary | ICD-10-CM | POA: Diagnosis not present

## 2017-04-26 DIAGNOSIS — C4921 Malignant neoplasm of connective and soft tissue of right lower limb, including hip: Secondary | ICD-10-CM | POA: Diagnosis not present

## 2017-04-27 DIAGNOSIS — C4921 Malignant neoplasm of connective and soft tissue of right lower limb, including hip: Secondary | ICD-10-CM | POA: Diagnosis not present

## 2017-04-28 DIAGNOSIS — C4921 Malignant neoplasm of connective and soft tissue of right lower limb, including hip: Secondary | ICD-10-CM | POA: Diagnosis not present

## 2017-05-01 DIAGNOSIS — C4921 Malignant neoplasm of connective and soft tissue of right lower limb, including hip: Secondary | ICD-10-CM | POA: Diagnosis not present

## 2017-05-02 DIAGNOSIS — C4921 Malignant neoplasm of connective and soft tissue of right lower limb, including hip: Secondary | ICD-10-CM | POA: Diagnosis not present

## 2017-05-03 DIAGNOSIS — C4921 Malignant neoplasm of connective and soft tissue of right lower limb, including hip: Secondary | ICD-10-CM | POA: Diagnosis not present

## 2017-05-04 DIAGNOSIS — C4921 Malignant neoplasm of connective and soft tissue of right lower limb, including hip: Secondary | ICD-10-CM | POA: Diagnosis not present

## 2017-05-05 DIAGNOSIS — C4921 Malignant neoplasm of connective and soft tissue of right lower limb, including hip: Secondary | ICD-10-CM | POA: Diagnosis not present

## 2017-05-08 DIAGNOSIS — C4921 Malignant neoplasm of connective and soft tissue of right lower limb, including hip: Secondary | ICD-10-CM | POA: Diagnosis not present

## 2017-05-09 ENCOUNTER — Other Ambulatory Visit: Payer: Self-pay | Admitting: Internal Medicine

## 2017-05-09 DIAGNOSIS — C4921 Malignant neoplasm of connective and soft tissue of right lower limb, including hip: Secondary | ICD-10-CM | POA: Diagnosis not present

## 2017-05-10 DIAGNOSIS — C4921 Malignant neoplasm of connective and soft tissue of right lower limb, including hip: Secondary | ICD-10-CM | POA: Diagnosis not present

## 2017-05-11 DIAGNOSIS — C4921 Malignant neoplasm of connective and soft tissue of right lower limb, including hip: Secondary | ICD-10-CM | POA: Diagnosis not present

## 2017-05-12 DIAGNOSIS — C4921 Malignant neoplasm of connective and soft tissue of right lower limb, including hip: Secondary | ICD-10-CM | POA: Diagnosis not present

## 2017-05-15 DIAGNOSIS — C4921 Malignant neoplasm of connective and soft tissue of right lower limb, including hip: Secondary | ICD-10-CM | POA: Diagnosis not present

## 2017-05-16 DIAGNOSIS — C4921 Malignant neoplasm of connective and soft tissue of right lower limb, including hip: Secondary | ICD-10-CM | POA: Diagnosis not present

## 2017-05-17 DIAGNOSIS — C4921 Malignant neoplasm of connective and soft tissue of right lower limb, including hip: Secondary | ICD-10-CM | POA: Diagnosis not present

## 2017-05-18 DIAGNOSIS — C4921 Malignant neoplasm of connective and soft tissue of right lower limb, including hip: Secondary | ICD-10-CM | POA: Diagnosis not present

## 2017-05-19 DIAGNOSIS — C4921 Malignant neoplasm of connective and soft tissue of right lower limb, including hip: Secondary | ICD-10-CM | POA: Diagnosis not present

## 2017-05-22 DIAGNOSIS — C4921 Malignant neoplasm of connective and soft tissue of right lower limb, including hip: Secondary | ICD-10-CM | POA: Diagnosis not present

## 2017-05-23 DIAGNOSIS — C4921 Malignant neoplasm of connective and soft tissue of right lower limb, including hip: Secondary | ICD-10-CM | POA: Diagnosis not present

## 2017-05-24 DIAGNOSIS — C4921 Malignant neoplasm of connective and soft tissue of right lower limb, including hip: Secondary | ICD-10-CM | POA: Diagnosis not present

## 2017-05-25 DIAGNOSIS — C4921 Malignant neoplasm of connective and soft tissue of right lower limb, including hip: Secondary | ICD-10-CM | POA: Diagnosis not present

## 2017-05-26 DIAGNOSIS — C4921 Malignant neoplasm of connective and soft tissue of right lower limb, including hip: Secondary | ICD-10-CM | POA: Diagnosis not present

## 2017-05-29 DIAGNOSIS — C4921 Malignant neoplasm of connective and soft tissue of right lower limb, including hip: Secondary | ICD-10-CM | POA: Diagnosis not present

## 2017-05-30 DIAGNOSIS — C4921 Malignant neoplasm of connective and soft tissue of right lower limb, including hip: Secondary | ICD-10-CM | POA: Diagnosis not present

## 2017-05-31 DIAGNOSIS — C4921 Malignant neoplasm of connective and soft tissue of right lower limb, including hip: Secondary | ICD-10-CM | POA: Diagnosis not present

## 2017-06-01 DIAGNOSIS — C4921 Malignant neoplasm of connective and soft tissue of right lower limb, including hip: Secondary | ICD-10-CM | POA: Diagnosis not present

## 2017-06-02 DIAGNOSIS — C4921 Malignant neoplasm of connective and soft tissue of right lower limb, including hip: Secondary | ICD-10-CM | POA: Diagnosis not present

## 2017-06-05 ENCOUNTER — Telehealth: Payer: Self-pay | Admitting: *Deleted

## 2017-06-05 DIAGNOSIS — C4921 Malignant neoplasm of connective and soft tissue of right lower limb, including hip: Secondary | ICD-10-CM | POA: Diagnosis not present

## 2017-06-05 NOTE — Telephone Encounter (Signed)
Spoke with bernadette at [facility] and she will call the patient and have them call his oncologist.

## 2017-06-05 NOTE — Telephone Encounter (Signed)
Why did the radiation doctor not prescribe it for him?  I'm hesitant to do this without their approval.

## 2017-06-05 NOTE — Telephone Encounter (Signed)
Philip [name] with [facility] called and left message on Clinical Intake requesting Rx for Sulfadiazine Cream due to radiation burn. Stated that his last treatment is tomorrow. Requesting the big tube of it called into [facility]. Please Advise.

## 2017-06-06 DIAGNOSIS — I77810 Thoracic aortic ectasia: Secondary | ICD-10-CM | POA: Diagnosis not present

## 2017-06-06 DIAGNOSIS — C4921 Malignant neoplasm of connective and soft tissue of right lower limb, including hip: Secondary | ICD-10-CM | POA: Diagnosis not present

## 2017-06-06 DIAGNOSIS — Z923 Personal history of irradiation: Secondary | ICD-10-CM | POA: Diagnosis not present

## 2017-06-06 DIAGNOSIS — C499 Malignant neoplasm of connective and soft tissue, unspecified: Secondary | ICD-10-CM | POA: Diagnosis not present

## 2017-06-06 DIAGNOSIS — R918 Other nonspecific abnormal finding of lung field: Secondary | ICD-10-CM | POA: Diagnosis not present

## 2017-06-06 DIAGNOSIS — Z9889 Other specified postprocedural states: Secondary | ICD-10-CM | POA: Diagnosis not present

## 2017-06-06 DIAGNOSIS — I890 Lymphedema, not elsewhere classified: Secondary | ICD-10-CM | POA: Diagnosis not present

## 2017-06-28 DIAGNOSIS — Z7952 Long term (current) use of systemic steroids: Secondary | ICD-10-CM | POA: Diagnosis not present

## 2017-06-28 DIAGNOSIS — M8580 Other specified disorders of bone density and structure, unspecified site: Secondary | ICD-10-CM | POA: Diagnosis not present

## 2017-06-28 DIAGNOSIS — N189 Chronic kidney disease, unspecified: Secondary | ICD-10-CM | POA: Diagnosis not present

## 2017-06-28 DIAGNOSIS — I776 Arteritis, unspecified: Secondary | ICD-10-CM | POA: Diagnosis not present

## 2017-06-28 DIAGNOSIS — M79606 Pain in leg, unspecified: Secondary | ICD-10-CM | POA: Diagnosis not present

## 2017-06-28 DIAGNOSIS — R5383 Other fatigue: Secondary | ICD-10-CM | POA: Diagnosis not present

## 2017-07-05 ENCOUNTER — Encounter: Payer: Self-pay | Admitting: Internal Medicine

## 2017-07-05 ENCOUNTER — Non-Acute Institutional Stay: Payer: Medicare Other | Admitting: Internal Medicine

## 2017-07-05 VITALS — BP 140/70 | HR 58 | Temp 97.9°F | Ht 72.0 in | Wt 160.0 lb

## 2017-07-05 DIAGNOSIS — E034 Atrophy of thyroid (acquired): Secondary | ICD-10-CM

## 2017-07-05 DIAGNOSIS — C4921 Malignant neoplasm of connective and soft tissue of right lower limb, including hip: Secondary | ICD-10-CM | POA: Diagnosis not present

## 2017-07-05 DIAGNOSIS — M316 Other giant cell arteritis: Secondary | ICD-10-CM | POA: Diagnosis not present

## 2017-07-05 DIAGNOSIS — I10 Essential (primary) hypertension: Secondary | ICD-10-CM | POA: Diagnosis not present

## 2017-07-05 DIAGNOSIS — F324 Major depressive disorder, single episode, in partial remission: Secondary | ICD-10-CM | POA: Diagnosis not present

## 2017-07-05 DIAGNOSIS — N183 Chronic kidney disease, stage 3 unspecified: Secondary | ICD-10-CM

## 2017-07-05 DIAGNOSIS — G4733 Obstructive sleep apnea (adult) (pediatric): Secondary | ICD-10-CM

## 2017-07-05 DIAGNOSIS — I701 Atherosclerosis of renal artery: Secondary | ICD-10-CM | POA: Diagnosis not present

## 2017-07-05 NOTE — Progress Notes (Signed)
Location:  Occupational psychologist of Service:  Clinic (12)  Provider: Danah Reinecke L. Mariea Clonts, D.O., C.M.D.  Code Status: DNR Goals of Care:  Advanced Directives 07/05/2017  Does Patient Have a Medical Advance Directive? Yes  Type of Paramedic of [place];Out of facility DNR (pink MOST or yellow form)  Does patient want to make changes to medical advance directive? No - Patient declined  Copy of [place] in Chart? Yes  Would patient like information on creating a medical advance directive? -  Pre-existing out of facility DNR order (yellow form or pink MOST form) Yellow form placed in chart (order not valid for inpatient use)     Chief Complaint  Patient presents with  . Medical Management of Chronic Issues    follow-up    HPI: Patient is a 84 y.o. male seen today for medical management of chronic diseases.    Sarcoma:  Leg healing well with burn cream.  Had CT when he finished the XRT and no signs of metastasis.  Is going to have alternating CT and xrays.  Will get xray in august.    Has been wearing his nasal CPAP every night for 8 hrs a night.  He reports it's not making any difference since early December.  He still wakes up tired and wakes up at 3am and not making it back to sleep until 5am.  Worse since radiation.  His wife reports she gets up a few times and he does not budge.    He started playing table tennis here again.  He used to play 3 hrs with short breaks between matches.  Now he's tired after 20 mins.  Discussed  that his recent surgery and radiation .  Returns august 21 at 45 with Dr. Rexene Alberts.    He's back with Dr. Cherre Blanc in August also.    Everything is fine since having his dilation done with Dr. Clide Dales him every 6 mos now.  Mood:  Fair to [employer].  Angelita Ingles was concerned he may have low grade depression causing him to not sleep well.  He is taking wellbutrin which is supposed to be tid but he  forgets it.  Tends to get one per day.  It does make a difference for him.    He admits he's having more difficulty remembering names.  He can't express the answers on jeopardy sometimes.  He remembers numbers very well.    He is taking his lisinopril 1-2x per week for his bp and taking garlic and celery seed.  Has not had to lisinopril in about 5 days.  Explained bp has crept up and he's due to take it.  Explained that pharmacology indicates daily use is needed.    He has a dermatology appt tomorrow for his annual exam.  He's taking the prednisone 5mg  just twice a week not daily as prescribed.   He's taking herbal antiinflammatories.  He needs new glasses and a hearing test.  His wife reports she is having to repeat things more often and he's not hearing them clearly.  He is c/o clarity issues not volume issues.    Past Medical History:  Diagnosis Date  . Aneurysm of left renal artery (HCC)    per CT 08-20-2015 stable 40mm  . 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 Bladder neck contracture   . BPH (benign prostatic hyperplasia)   . CKD (chronic kidney  disease), stage III ([place])   . Complication of anesthesia    post op acute urinary retention  . DDD (degenerative disc disease)   . Foley catheter in place    placed 02-29-2019 secondary to urinary retention post surgery  . Full dentures   . GERD (gastroesophageal reflux disease)   . History of external beam radiation therapy 2002   prostate cancer  and boost with radioative prostate seed implants  . History of prostate cancer DX  2002   S/P EXTERNAL RADIATION/ RADIOACTIVE SEED IMPLANTS  2003  . History of squamous cell carcinoma excision    2012--  RIGHT LOWER EXTREM;  2019 LEFT LOWER EXTREMITIY  . HTN (hypertension)   . Hyperlipemia   . Hypothyroidism    endocrinologist-  dr Chalmers Cater--- per pt takes his own thyroid med. called NP Thyroid 10 mg daily, does not  take synthroid  . Large vessel vasculitis (HCC)    aortitis--- rheumotologist-  dr Charlestine Night  . Left renal artery stenosis (HCC)    proximal per CT 08-20-2015 but patent  . Left renal atrophy   . Macular degeneration   . Nocturia   . OSA on CPAP    pt retested 11-13-2016 at [facility] w/ dr ather-- moderate to severe OSA , AHI 16.4/h/  03-24-2017 per pt is consistant using every night  . Pulmonary nodule    per CT 02-28-2017 in [place] in epic done at [place],  LUL nodule , not mets  . Renal cyst, right   . Restless legs syndrome (RLS) 10/31/2011  . S/P radiation therapy     for tonsillar cancer (head and neck) completed 01-26-2012 at [facility])  . Saliva decreased   . Sarcoma of lower extremity, right ([place]) oncolgoist-  dr Juluis Rainier ([place])   dx 02-14-2017 w/ needle core bx;  high grade pleomorphic spindle cell sarcoma , grade 2 (cT2N0M0); 03-15-2017  radical resection sarcoma tumor right lower leg  and plan radiation  . Self-catheterizes urinary bladder    QID   AND   PRN  . 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 Urethral stricture    chronic---  post urethral dilation's  . Urinary retention with incomplete bladder emptying   . Vitamin B 12 deficiency 01/28/2011  . Vitamin D deficiency     Past Surgical History:  Procedure Laterality Date  . BALLOON DILATION N/A 11/10/2014   Procedure: CYSTO BALLOON DILATION AND RETROGRADE URETHROGRAM ;  Surgeon: Bjorn Loser, MD;  Location: [facility];  Service: Urology;  Laterality: N/A;  . CATARACT EXTRACTION W/ INTRAOCULAR LENS  IMPLANT, BILATERAL    . CYSTO/ BALLOON DILATION OF  URETHRAL STRICTURE  12-25-2010  . CYSTOSCOPY WITH RETROGRADE  URETHROGRAM N/A 03/30/2016   Procedure: CYSTOSCOPY WITH RETROGRADE URETHROGRAM AND BALLOON DILATION with cystogram;  Surgeon: Bjorn Loser, MD;  Location[facility];  Service: Urology;  Laterality: N/A;  . CYSTOSCOPY WITH URETHRAL DILATATION  05/31/2011   Procedure: CYSTOSCOPY WITH URETHRAL DILATATION;  Surgeon: Reece Packer, MD;  Location: [place];  Service: Urology;  Laterality: N/A;  BALLOON DILATION  . CYSTOSCOPY WITH URETHRAL DILATATION N/A 09/13/2016   Procedure: CYSTOSCOPY WITH URETHRAL BALLOON DILATATION;  Surgeon: Matilde Sprang,  Nicki Reaper, MD;  Location: [facility];  Service: Urology;  Laterality: N/A;  . CYSTOSCOPY WITH URETHRAL DILATATION N/A 03/30/2017   Procedure: CYSTOSCOPY WITH BALLOON URETHRAL DILATATION;  Surgeon: Bjorn Loser, MD;  Location: [place];  Service: Urology;  Laterality: N/A;  . CYSTOSCOPY/RETROGRADE/URETEROSCOPY N/A 05/22/2012   Procedure: CYSTOSCOPY BALLOON DILATION RETROGRADE URETEROGRAM ;  Surgeon: Reece Packer, MD;  Location: [place];  Service: Urology;  Laterality: N/A;  . CYTSO/  DILATATION URETHRAL STRICTURE/  BX PROSTATIC URETHRA/  REMOVAL FOREIGN BODIES  06-25-2010   [facility]  . INGUINAL HERNIA REPAIR Right 2000  . MOHS SURGERY  01/ 2019    [facility]   left lower leg for SCC  . RADICAL RESECTION OF SARCOMA TUMOR   03-15-2017   [facility]   RIGHT LOWER LEG , CALF AREA  . RADIOACTIVE SEED IMPLANTS, PROSTATE  JAN  2003  . SKIN LESION EXCISION  07/2012   [facility]ST  right shoulder    Allergies  Allergen Reactions  . Contrast Media [Iodinated Diagnostic Agents] Other (See Comments)    Avoid due to renal disease  . Nitrofurantoin Swelling  . Nsaids Other (See Comments)    Has been told no NSAIDs, Ibuprofen Renal insufficiency hx.   . Cephalosporins Rash  . Suprax [Cefixime] Rash    Outpatient Encounter Medications as of 07/05/2017  Medication Sig  . buPROPion (WELLBUTRIN) 100  MG tablet TAKE 1 TABLET BY MOUTH TWICE DAILY  . cholecalciferol (VITAMIN D) 1000 units tablet Take 2,000 Units by mouth daily.  . Coenzyme Q10 (COQ10) 100 MG CAPS Take 100 mg by mouth daily. Take one daily  . Cyanocobalamin-Methylcobalamin 600-600 MCG SUBL Place under the tongue daily.  Marland Kitchen DIGESTIVE ENZYMES PO Take 500 mg by mouth every morning.   . fish oil-omega-3 fatty acids 1000 MG capsule Take 2 g by mouth daily. Name:  Natural Factors Omega-3  . lisinopril (PRINIVIL,ZESTRIL) 10 MG tablet Take 10 mg by mouth daily.  . Multiple Vitamins-Minerals (ICAPS AREDS 2 PO) Take 2 tablets by mouth daily.  . NP THYROID 90 MG tablet Take 90 mg by mouth daily.  . predniSONE (DELTASONE) 5 MG tablet Take 5 mg by mouth daily with breakfast.  . RESVERATROL PO Take 100 mg by mouth 2 (two) times daily.   . TURMERIC CURCUMIN PO Take 4 tablets by mouth daily.   Marland Kitchen UNABLE TO FIND [place] Gems take once daily  . UNABLE TO FIND [place] Bone take once daily  . UNABLE TO FIND [place] take 4 tablets once daily  . UNABLE TO FIND Comprehensive immune support take 2 tablets once daily  . UNABLE TO FIND Modified citrus pectin take 4 tablets once daily  . UNABLE TO FIND Blood pressure health ( nattokinase, Suntheanine) take 2 tablets once daily  . UNABLE TO FIND Celery seed take 2 tablets by mouth once daily  . UNABLE TO FIND Cataplex B take 4 tablets once daily  . UNABLE TO FIND [place] plus take 4 tablets by mouth once daily  . UNABLE TO FIND Cataplex C take 4 tablets once daily  . UNABLE TO FIND Cataplex E take 4 tablets once daily  . UNABLE TO FIND Arginex take 4 tablets once daily  . UNABLE TO FIND Min-Tran take 4 tablet once daily  . UNABLE TO FIND Immuplex take 4 tablets once daily  . [DISCONTINUED] lisinopril (PRINIVIL,ZESTRIL) 10 MG tablet TAKE ONE TABLET BY MOUTH ONCE DAILY (Patient taking differently: TAKE ONE  TABLET BY MOUTH ONCE DAILY--- takes in pm)  . [DISCONTINUED] GLUCOSAMINE-CHONDROITIN-MSM PO  Take 500 mg by mouth 2 (two) times daily.   . [DISCONTINUED] NP THYROID PO Take 10 mg by mouth daily after breakfast.   . [DISCONTINUED] Quercetin Dihydrate POWD Take by mouth daily.  . [DISCONTINUED] Quercetin Dihydrate POWD Take by mouth.  . [DISCONTINUED] UNABLE TO FIND [employer] Multivitamin take once daily   No facility-administered encounter medications on file as of 07/05/2017.     Review of Systems:  Review of Systems  Constitutional: Positive for malaise/fatigue. Negative for chills and fever.  HENT: Positive for hearing loss. Negative for congestion.        Cerumen bilaterally but not obstructive--appears moist  Eyes: Negative for blurred vision.       Says he needs new glasses  Respiratory: Negative for cough and shortness of breath.   Cardiovascular: Negative for chest pain and palpitations.  Gastrointestinal: Negative for abdominal pain, blood in stool, constipation and melena.  Genitourinary: Negative for dysuria.  Musculoskeletal: Negative for falls and myalgias.  Skin: Negative for itching and rash.  Neurological: Negative for dizziness and loss of consciousness.  Endo/Heme/Allergies: Does not bruise/bleed easily.  Psychiatric/Behavioral: Positive for memory loss. Negative for depression. The patient is not nervous/anxious and does not have insomnia.        Admits to some word-finding difficulty    Health Maintenance  Topic Date Due  . TETANUS/TDAP  01/17/2013  . INFLUENZA VACCINE  08/17/2017  . PNA vac Low Risk Adult  Completed    Physical Exam: Vitals:   07/05/17 1431  BP: 140/70  Pulse: (!) 58  Temp: 97.9 F (36.6 C)  TempSrc: Oral  SpO2: 97%  Weight: 160 lb (72.6 kg)  Height: 6' (1.829 m)   Body mass index is 21.7 kg/m. Physical Exam  Constitutional: He is oriented to person, place, and time. He appears well-developed. No distress.  HENT:  Head: Normocephalic and atraumatic.  Mouth/Throat: No oropharyngeal exudate.  Cerumen  bilaterally but not obstructive--appears moist  Eyes:  glasses  Cardiovascular: Normal rate, regular rhythm, normal heart sounds and intact distal pulses.  Pulmonary/Chest: Effort normal and breath sounds normal. No respiratory distress.  Abdominal: Soft. Bowel sounds are normal.  Musculoskeletal: Normal range of motion.  Right leg deformity has filled in considerably post surgery; mild pink erythema persists and 1+ edema vs left leg  Neurological: He is alert and oriented to person, place, and time.  Psychiatric: He has a normal mood and affect.    Labs reviewed: Basic Metabolic Panel: Recent Labs    09/13/16 1215 02/07/17 1441 03/22/17 03/30/17 0616  NA 143 143 142 144  K 3.9 4.8 4.8 3.9  CL 104 102  --   --   CO2  --  26  --   --   GLUCOSE 95 89  --  94  BUN 27* 23 21  --   CREATININE 1.70* 1.50* 1.5*  --   CALCIUM  --  9.1  --   --   TSH  --   --  2.65  --    Liver Function Tests: Recent Labs    03/22/17  AST 18  ALT 20  ALKPHOS 65   No results for input(s): LIPASE, AMYLASE in the last 8760 hours. No results for input(s): AMMONIA in the last 8760 hours. CBC: Recent Labs    09/13/16 1215 03/22/17 03/30/17 0616  WBC  --  6.4  --   HGB  12.2* 13.2* 11.9*  HCT 36.0* 40* 35.0*  PLT  --  297  --    Assessment/Plan 1. Sarcoma of lower extremity, right [facility]) -s/p surgical excision 03/15/17 and radiation therapy just completed recently at [facility] -has f/u CXR and visit with Dr. Cherre Blanc in august  2. OSA (obstructive sleep apnea) -is using CPAP faithfully, has f/u with Dr. Rexene Alberts in August also -he reports it's not making any difference, but his wife disagrees  3. Large vessel vasculitis (HCC) -aortitis; remains on prednisone 5mg  which he's taking just twice a week--not clear how often he's meant to be taking it--need Dr. Audelia Hives latest note  4. Hypothyroidism due to acquired atrophy of thyroid -continues on his thyroid supplement of his choice-did not want synthroid  "due to side effects"  5. Major depressive disorder with single episode, in partial remission ([place]) -doing ok with wellbutrin, but only takes one a day instead of twice, but he says he's meant to take it three times a day (no idea where he got that instruction)  6. Chronic kidney disease, stage 3 (HCC) -not faithful with lisinopril use so may progress due to this and mysterious supplements he doctors himself with regularly -he reports the lisinopril has side effects but he does not know what they are and has not had any of them, but the things he takes are natural and don't have side effects (discussed that many increase risk of bleeding)  7. Essential hypertension -bp elevated today b/c he's not taken lisinopril for 4 days  Labs/tests ordered:  Needs labs before also--cbc, cmp, tsh Next appt:  Needs 4 month f/u but he left w/o making it  Rogerick Baldwin L. Edeline Greening, D.O. [facility] [address], [place] [address] Cell Phone (Mon-Fri 8am-5pm):  [phone] On Call:  [phone] & follow prompts after 5pm & weekends Office Phone:  [phone] Office Fax:  [phone]

## 2017-07-06 DIAGNOSIS — D225 Melanocytic nevi of trunk: Secondary | ICD-10-CM | POA: Diagnosis not present

## 2017-07-06 DIAGNOSIS — Z85828 Personal history of other malignant neoplasm of skin: Secondary | ICD-10-CM | POA: Diagnosis not present

## 2017-07-06 DIAGNOSIS — D1801 Hemangioma of skin and subcutaneous tissue: Secondary | ICD-10-CM | POA: Diagnosis not present

## 2017-07-06 DIAGNOSIS — L814 Other melanin hyperpigmentation: Secondary | ICD-10-CM | POA: Diagnosis not present

## 2017-07-06 DIAGNOSIS — L570 Actinic keratosis: Secondary | ICD-10-CM | POA: Diagnosis not present

## 2017-07-06 DIAGNOSIS — L905 Scar conditions and fibrosis of skin: Secondary | ICD-10-CM | POA: Diagnosis not present

## 2017-07-06 DIAGNOSIS — L821 Other seborrheic keratosis: Secondary | ICD-10-CM | POA: Diagnosis not present

## 2017-07-07 ENCOUNTER — Telehealth: Payer: Self-pay | Admitting: *Deleted

## 2017-07-07 NOTE — Telephone Encounter (Signed)
This may be a natural localized response, hard to determine if this actually needs an antibiotic without looking at it. If area is not warm or tender would monitor at this time. Most likely if it is a small area will resolve on its own.

## 2017-07-07 NOTE — Telephone Encounter (Signed)
Spoke with patient and wife. They both agreed. Stated that if the sites gets bigger or warm/tender to touch they will go to Urgent care to have it evaluated.

## 2017-07-07 NOTE — Telephone Encounter (Signed)
Patient Environmental education officer Resident) called and stated that he got a tick off his leg yesterday and now there is a red circle all the way around it. No warmth. Does not look like a definite bulls eye. Patient stated he removed the whole tick and saved it. Does not want to go to the Urgent Care. Wants a antibiotic called into pharmacy. Please Advise.

## 2017-07-10 ENCOUNTER — Encounter: Payer: Self-pay | Admitting: Nurse Practitioner

## 2017-07-10 ENCOUNTER — Ambulatory Visit (INDEPENDENT_AMBULATORY_CARE_PROVIDER_SITE_OTHER): Payer: Medicare Other | Admitting: Nurse Practitioner

## 2017-07-10 VITALS — BP 156/88 | HR 62 | Temp 98.7°F | Ht 71.0 in | Wt 151.0 lb

## 2017-07-10 DIAGNOSIS — I701 Atherosclerosis of renal artery: Secondary | ICD-10-CM | POA: Diagnosis not present

## 2017-07-10 DIAGNOSIS — W57XXXA Bitten or stung by nonvenomous insect and other nonvenomous arthropods, initial encounter: Secondary | ICD-10-CM

## 2017-07-10 DIAGNOSIS — C4921 Malignant neoplasm of connective and soft tissue of right lower limb, including hip: Secondary | ICD-10-CM | POA: Diagnosis not present

## 2017-07-10 DIAGNOSIS — I10 Essential (primary) hypertension: Secondary | ICD-10-CM | POA: Diagnosis not present

## 2017-07-10 NOTE — Progress Notes (Signed)
Careteam: Patient Care Team: Gayland Curry, DO as PCP - General (Geriatric Medicine) Bjorn Loser, MD as Consulting Physician (Urology) Community, [facility] Juluis Rainier, Youlanda Roys, MD (Radiation Oncology) Jacelyn Pi, MD as Consulting Physician (Endocrinology) Cherre Blanc Mayford Knife, MD as Referring Physician (Orthopedic Surgery)  Advanced Directive information Does Patient Have a Medical Advance Directive?: Yes, Type of Advance Directive: [place];Out of facility DNR (pink MOST or yellow form), Pre-existing out of facility DNR order (yellow form or pink MOST form): Yellow form placed in chart (order not valid for inpatient use)  Allergies  Allergen Reactions  . Contrast Media [Iodinated Diagnostic Agents] Other (See Comments)    Avoid due to renal disease  . Nitrofurantoin Swelling  . Nsaids Other (See Comments)    Has been told no NSAIDs, Ibuprofen Renal insufficiency hx.   . Cephalosporins Rash  . Suprax [Cefixime] Rash    Chief Complaint  Patient presents with  . Acute Visit    Pt is being seen due a tick bite on right upper thigh 3 days ago. Pt states that there is a brownish-red area at site but it does not itch or hurt.      HPI: Patient is a 84 y.o. male seen in the office today due to tick bite 4 days.  Reports it was on less than 24 hours, had a full body exam the day before by the dermatologist and it was not there.  Got the entire tick out with head intact.  No rash or fever. Overall feels well.  No redness to area but area has small bruising.  Hx of sarcoma of lower extremity, has completed XRT without signs of mets  Review of Systems:  Review of Systems  Constitutional: Negative for chills, fever and malaise/fatigue.  Musculoskeletal: Negative for myalgias.  Skin: Negative for itching and rash.       Tick bite noted to right anterior thigh with small amts of bruising, no redness, heat, or pain  Neurological: Negative  for dizziness and headaches.    Past Medical History:  Diagnosis Date  . Aneurysm of left renal artery (HCC)    per CT 08-20-2015 stable 20mm  . 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 Bladder neck contracture   . BPH (benign prostatic hyperplasia)   . CKD (chronic kidney disease), stage III ([place])   . Complication of anesthesia    post op acute urinary retention  . DDD (degenerative disc disease)   . Foley catheter in place    placed 02-29-2019 secondary to urinary retention post surgery  . Full dentures   . GERD (gastroesophageal reflux disease)   . History of external beam radiation therapy 2002   prostate cancer  and boost with radioative prostate seed implants  . History of prostate cancer DX  2002   S/P EXTERNAL RADIATION/ RADIOACTIVE SEED IMPLANTS  2003  . History of squamous cell carcinoma excision    2012--  RIGHT LOWER EXTREM;  2019 LEFT LOWER EXTREMITIY  . HTN (hypertension)   . Hyperlipemia   . Hypothyroidism    endocrinologist-  dr Chalmers Cater--- per pt takes his own thyroid med. called NP Thyroid 10 mg daily, does not take synthroid  . Large vessel vasculitis (HCC)    aortitis--- rheumotologist-  dr Charlestine Night  . Left renal artery stenosis (HCC)    proximal per CT 08-20-2015 but patent  . Left renal atrophy   .  Macular degeneration   . Nocturia   . OSA on CPAP    pt retested 11-13-2016 at [facility] w/ dr ather-- moderate to severe OSA , AHI 16.4/h/  03-24-2017 per pt is consistant using every night  . Pulmonary nodule    per CT 02-28-2017 in [place] in epic done at [place],  LUL nodule , not mets  . Renal cyst, right   . Restless legs syndrome (RLS) 10/31/2011  . S/P radiation therapy     for tonsillar cancer (head and neck) completed 01-26-2012 at [facility])  . Saliva decreased   . Sarcoma of lower extremity, right ([place]) oncolgoist-  dr Juluis Rainier ([place])   dx 02-14-2017 w/ needle  core bx;  high grade pleomorphic spindle cell sarcoma , grade 2 (cT2N0M0); 03-15-2017  radical resection sarcoma tumor right lower leg  and plan radiation  . Self-catheterizes urinary bladder    QID   AND   PRN  . 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 Urethral stricture    chronic---  post urethral dilation's  . Urinary retention with incomplete bladder emptying   . Vitamin B 12 deficiency 01/28/2011  . Vitamin D deficiency    Past Surgical History:  Procedure Laterality Date  . BALLOON DILATION N/A 11/10/2014   Procedure: CYSTO BALLOON DILATION AND RETROGRADE URETHROGRAM ;  Surgeon: Bjorn Loser, MD;  Location: [facility];  Service: Urology;  Laterality: N/A;  . CATARACT EXTRACTION W/ INTRAOCULAR LENS  IMPLANT, BILATERAL    . CYSTO/ BALLOON DILATION OF  URETHRAL STRICTURE  12-25-2010  . CYSTOSCOPY WITH RETROGRADE URETHROGRAM N/A 03/30/2016   Procedure: CYSTOSCOPY WITH RETROGRADE URETHROGRAM AND BALLOON DILATION with cystogram;  Surgeon: Bjorn Loser, MD;  Location: [facility];  Service: Urology;  Laterality: N/A;  . CYSTOSCOPY WITH URETHRAL DILATATION  05/31/2011   Procedure: CYSTOSCOPY WITH URETHRAL DILATATION;  Surgeon: Reece Packer, MD;  Location: [place];  Service: Urology;  Laterality: N/A;  BALLOON DILATION  . CYSTOSCOPY WITH URETHRAL DILATATION N/A 09/13/2016   Procedure: CYSTOSCOPY WITH URETHRAL BALLOON DILATATION;  Surgeon: Bjorn Loser, MD;  Location: [place];  Service: Urology;  Laterality: N/A;  . CYSTOSCOPY WITH URETHRAL DILATATION N/A 03/30/2017   Procedure: CYSTOSCOPY WITH BALLOON URETHRAL DILATATION;  Surgeon:  Bjorn Loser, MD;  Location: [place];  Service: Urology;  Laterality: N/A;  . CYSTOSCOPY/RETROGRADE/URETEROSCOPY N/A 05/22/2012   Procedure: CYSTOSCOPY BALLOON DILATION RETROGRADE URETEROGRAM ;  Surgeon: Reece Packer, MD;  Location: [place];  Service: Urology;  Laterality: N/A;  . CYTSO/  DILATATION URETHRAL STRICTURE/  BX PROSTATIC URETHRA/  REMOVAL FOREIGN BODIES  06-25-2010   [facility]  . INGUINAL HERNIA REPAIR Right 2000  . MOHS SURGERY  01/ 2019    [facility]   left lower leg for SCC  . RADICAL RESECTION OF SARCOMA TUMOR   03-15-2017   [facility]   RIGHT LOWER LEG , CALF AREA  . RADIOACTIVE SEED IMPLANTS, PROSTATE  JAN  2003  . SKIN LESION EXCISION  07/2012   MOST  right shoulder   Social History:   reports that he has never smoked. He has never used smokeless tobacco. He reports that he  drinks about 4.2 oz of alcohol per week. He reports that he does not use drugs.  Family History  Problem Relation Age of Onset  . Stroke Father     Medications: Patient's Medications  New Prescriptions   No medications on file  Previous Medications   BUPROPION (WELLBUTRIN) 100 MG TABLET    TAKE 1 TABLET BY MOUTH TWICE DAILY   CHOLECALCIFEROL (VITAMIN D) 1000 UNITS TABLET    Take 2,000 Units by mouth daily.   COENZYME Q10 (COQ10) 100 MG CAPS    Take 100 mg by mouth daily. Take one daily   CYANOCOBALAMIN-METHYLCOBALAMIN 600-600 MCG SUBL    Place under the tongue daily.   DIGESTIVE ENZYMES PO    Take 500 mg by mouth every morning.    FISH OIL-OMEGA-3 FATTY ACIDS 1000 MG CAPSULE    Take 2 g by mouth daily. Name:  Natural Factors Omega-3   LISINOPRIL (PRINIVIL,ZESTRIL) 10 MG TABLET    Take 10 mg by mouth daily.   MULTIPLE VITAMINS-MINERALS (ICAPS AREDS 2 PO)    Take 2 tablets by mouth daily.   NP THYROID 90 MG TABLET    Take 90 mg by mouth daily.   PREDNISONE (DELTASONE) 5 MG TABLET    Take 5 mg by mouth daily with breakfast.   RESVERATROL PO    Take 100 mg by mouth 2  (two) times daily.    TURMERIC CURCUMIN PO    Take 2 tablets by mouth daily.    UNABLE TO FIND    [place] Gems take once daily   UNABLE TO FIND    [place] Bone take once daily   UNABLE TO FIND    [place] take 4 tablets once daily   UNABLE TO FIND    Comprehensive immune support take 2 tablets once daily   UNABLE TO FIND    Modified citrus pectin take 4 tablets once daily   UNABLE TO FIND    Celery seed take 2 tablets by mouth once daily   UNABLE TO FIND    Cataplex B take 4 tablets once daily   UNABLE TO FIND    [place]uta plus take 4 tablets by mouth once daily   UNABLE TO FIND    Cataplex C take 4 tablets once daily   UNABLE TO FIND    Cataplex E take 4 tablets once daily   UNABLE TO FIND    Arginex take 4 tablets once daily   UNABLE TO FIND    Min-Tran take 4 tablet once daily   UNABLE TO FIND    Immuplex take 4 tablets once daily  Modified Medications   No medications on file  Discontinued Medications   UNABLE TO FIND    Blood pressure health ( nattokinase, Suntheanine) take 2 tablets once daily     Physical Exam:  Vitals:   07/10/17 1148  BP: (!) 156/88  Pulse: 62  Temp: 98.7 F (37.1 C)  TempSrc: Oral  SpO2: 97%  Weight: 151 lb (68.5 kg)  Height: 5\' 11"  (1.803 m)   Body mass index is 21.06 kg/m.  Physical Exam  Constitutional: He is oriented to person, place, and time. He appears well-developed and well-nourished.  Cardiovascular: Normal rate, regular rhythm and normal heart sounds.  Pulmonary/Chest: Effort normal and breath sounds normal.  Neurological: He is alert and oriented to person, place, and time.  Skin: Skin is warm and dry. No rash noted. No erythema. No pallor.  Dime size area of bruising  noted to right anterior thigh, no redness or tenderness noted. No enlarged inguinal lymph nodes     Labs reviewed: Basic Metabolic Panel: Recent Labs    09/13/16 1215 02/07/17 1441 03/22/17 03/30/17 0616  NA 143 143 142 144  K 3.9 4.8 4.8 3.9  CL 104 102   --   --   CO2  --  26  --   --   GLUCOSE 95 89  --  94  BUN 27* 23 21  --   CREATININE 1.70* 1.50* 1.5*  --   CALCIUM  --  9.1  --   --   TSH  --   --  2.65  --    Liver Function Tests: Recent Labs    03/22/17  AST 18  ALT 20  ALKPHOS 65   No results for input(s): LIPASE, AMYLASE in the last 8760 hours. No results for input(s): AMMONIA in the last 8760 hours. CBC: Recent Labs    09/13/16 1215 03/22/17 03/30/17 0616  WBC  --  6.4  --   HGB 12.2* 13.2* 11.9*  HCT 36.0* 40* 35.0*  PLT  --  297  --    Lipid Panel: No results for input(s): CHOL, HDL, LDLCALC, TRIG, CHOLHDL, LDLDIRECT in the last 8760 hours. TSH: Recent Labs    03/22/17  TSH 2.65   A1C: No results found for: HGBA1C   Assessment/Plan 1. Tick bite, initial encounter No signs of infection noted to continue to monitor.  2. Sarcoma of lower extremity, right ([place]) -s/p surgical excision 03/15/17 and radiation therapy just completed recently at [facility]. Should not effect healing from tick bite, no signs of infection or reaction.   3. Essential hypertension Elevated today, pt reports he has not taken his medication/supplements yet today. Continue proper diet and educated that blood pressure should be <140/90.   Next appt: as scheduled with Dr Mariea Clonts, sooner if needed. Return precautions discussed.   Carlos American. [place], [facility] [phone]

## 2017-07-10 NOTE — Patient Instructions (Addendum)
Blood pressure a little elevated today, goal <140/90.   To notify if area becomes red, swollen, fevers, chills, malaise or fatigue occur If you get a rash

## 2017-07-21 DIAGNOSIS — Z789 Other specified health status: Secondary | ICD-10-CM | POA: Diagnosis not present

## 2017-07-21 DIAGNOSIS — R339 Retention of urine, unspecified: Secondary | ICD-10-CM | POA: Diagnosis not present

## 2017-07-21 DIAGNOSIS — N390 Urinary tract infection, site not specified: Secondary | ICD-10-CM | POA: Diagnosis not present

## 2017-07-25 DIAGNOSIS — R3914 Feeling of incomplete bladder emptying: Secondary | ICD-10-CM | POA: Diagnosis not present

## 2017-07-25 DIAGNOSIS — N3020 Other chronic cystitis without hematuria: Secondary | ICD-10-CM | POA: Diagnosis not present

## 2017-08-03 DIAGNOSIS — H353132 Nonexudative age-related macular degeneration, bilateral, intermediate dry stage: Secondary | ICD-10-CM | POA: Diagnosis not present

## 2017-08-03 DIAGNOSIS — H43813 Vitreous degeneration, bilateral: Secondary | ICD-10-CM | POA: Diagnosis not present

## 2017-08-03 DIAGNOSIS — D3132 Benign neoplasm of left choroid: Secondary | ICD-10-CM | POA: Diagnosis not present

## 2017-08-08 ENCOUNTER — Non-Acute Institutional Stay: Payer: Medicare Other

## 2017-08-08 VITALS — BP 140/80 | HR 78 | Temp 97.8°F | Ht 71.0 in | Wt 157.0 lb

## 2017-08-08 DIAGNOSIS — Z0000 Encounter for general adult medical examination without abnormal findings: Secondary | ICD-10-CM | POA: Diagnosis not present

## 2017-08-08 NOTE — Progress Notes (Signed)
Subjective:   Philip [name] is a 84 y.o. male who presents for Medicare Annual/Subsequent preventive examination at [facility]   Last AWV-08/02/2016    Objective:    Vitals: BP 140/80 (BP Location: Right Arm, Patient Position: Sitting)   Pulse 78   Temp 97.8 F (36.6 C) (Oral)   Ht 5\' 11"  (1.803 m)   Wt 157 lb (71.2 kg)   SpO2 98%   BMI 21.90 kg/m   Body mass index is 21.9 kg/m.  Advanced Directives 08/08/2017 07/10/2017 07/05/2017 03/30/2017 03/21/2017 09/28/2016 09/13/2016  Does Patient Have a Medical Advance Directive? Yes Yes Yes Yes Yes Yes Yes  Type of Paramedic of [place];Out of facility DNR (pink MOST or yellow form) [place];Out of facility DNR (pink MOST or yellow form) [place];Out of facility DNR (pink MOST or yellow form) [place];Living will [place];Out of facility DNR (pink MOST or yellow form) [place];Out of facility DNR (pink MOST or yellow form) [place];Living will  Does patient want to make changes to medical advance directive? No - Patient declined - No - Patient declined - No - Patient declined - No - Patient declined  Copy of [place] in Chart? Yes Yes Yes No - copy requested Yes Yes No - copy requested  Would patient like information on creating a medical advance directive? - - - - - - -  Pre-existing out of facility DNR order (yellow form or pink MOST form) Yellow form placed in chart (order not valid for inpatient use) Yellow form placed in chart (order not valid for inpatient use) Yellow form placed in chart (order not valid for inpatient use) - Yellow form placed in chart (order not valid for inpatient use) Yellow form placed in chart (order not valid for inpatient use) -    Tobacco Social History   Tobacco Use  Smoking Status Never Smoker  Smokeless Tobacco Never Used      Counseling given: Not Answered   Clinical Intake:  Pre-visit preparation completed: No  Pain : No/denies pain     Nutritional Risks: None Diabetes: No  How often do you need to have someone help you when you read instructions, pamphlets, or other written materials from your doctor or pharmacy?: 1 - Never What is the last grade level you completed in school?: High school  Interpreter Needed?: No  Information entered by :: Tyson Dense, RN  Past Medical History:  Diagnosis Date  . Aneurysm of left renal artery (HCC)    per CT 08-20-2015 stable 62mm  . 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 Bladder neck contracture   . BPH (benign prostatic hyperplasia)   . CKD (chronic kidney disease), stage III ([place])   . Complication of anesthesia    post op acute urinary retention  . DDD (degenerative disc disease)   . Foley catheter in place    placed 02-29-2019 secondary to urinary retention post surgery  . Full dentures   . GERD (gastroesophageal reflux disease)   . History of external beam radiation therapy 2002   prostate cancer  and boost with radioative prostate seed implants  . History of prostate cancer DX  2002   S/P EXTERNAL RADIATION/ RADIOACTIVE SEED IMPLANTS  2003  . History of squamous cell carcinoma excision    2012--  RIGHT LOWER EXTREM;  2019 LEFT LOWER EXTREMITIY  . HTN (hypertension)   . Hyperlipemia   . Hypothyroidism    endocrinologist-  dr Chalmers Cater--- per pt takes his own thyroid med. called NP Thyroid 10 mg daily, does not take synthroid  . Large vessel vasculitis (HCC)    aortitis--- rheumotologist-  dr Charlestine Night  . Left renal artery stenosis (HCC)    proximal per CT 08-20-2015 but patent  . Left renal atrophy   . Macular degeneration   . Nocturia   . OSA on CPAP    pt retested 11-13-2016 at [facility] w/ dr ather-- moderate to severe OSA , AHI 16.4/h/  03-24-2017 per pt  is consistant using every night  . Pulmonary nodule    per CT 02-28-2017 in [place] in epic done at [place],  LUL nodule , not mets  . Renal cyst, right   . Restless legs syndrome (RLS) 10/31/2011  . S/P radiation therapy     for tonsillar cancer (head and neck) completed 01-26-2012 at [facility])  . Saliva decreased   . Sarcoma of lower extremity, right ([place]) oncolgoist-  dr Juluis Rainier ([place])   dx 02-14-2017 w/ needle core bx;  high grade pleomorphic spindle cell sarcoma , grade 2 (cT2N0M0); 03-15-2017  radical resection sarcoma tumor right lower leg  and plan radiation  . Self-catheterizes urinary bladder    QID   AND   PRN  . 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 Urethral stricture    chronic---  post urethral dilation's  . Urinary retention with incomplete bladder emptying   . Vitamin B 12 deficiency 01/28/2011  . Vitamin D deficiency    Past Surgical History:  Procedure Laterality Date  . BALLOON DILATION N/A 11/10/2014   Procedure: CYSTO BALLOON DILATION AND RETROGRADE URETHROGRAM ;  Surgeon: Bjorn Loser, MD;  [facility];  Service: Urology;  Laterality: N/A;  . CATARACT EXTRACTION W/ INTRAOCULAR LENS  IMPLANT, BILATERAL    . CYSTO/ BALLOON DILATION OF  URETHRAL STRICTURE  12-25-2010  . CYSTOSCOPY WITH RETROGRADE URETHROGRAM N/A 03/30/2016   Procedure: CYSTOSCOPY WITH RETROGRADE URETHROGRAM AND BALLOON DILATION with cystogram;  Surgeon: Bjorn Loser, MD;  Location: [facility];  Service: Urology;  Laterality: N/A;  . CYSTOSCOPY WITH URETHRAL DILATATION  05/31/2011   Procedure: CYSTOSCOPY WITH URETHRAL DILATATION;  Surgeon: Reece Packer, MD;  Location:  [place];  Service: Urology;  Laterality: N/A;  BALLOON DILATION  . CYSTOSCOPY WITH URETHRAL DILATATION N/A 09/13/2016   Procedure: CYSTOSCOPY WITH URETHRAL BALLOON DILATATION;  Surgeon: Bjorn Loser, MD;  Location: [place];  Service: Urology;  Laterality: N/A;  . CYSTOSCOPY WITH URETHRAL DILATATION N/A 03/30/2017   Procedure: CYSTOSCOPY WITH BALLOON URETHRAL DILATATION;  Surgeon: Bjorn Loser, MD;  Location: [place];  Service: Urology;  Laterality: N/A;  . CYSTOSCOPY/RETROGRADE/URETEROSCOPY N/A 05/22/2012   Procedure: CYSTOSCOPY BALLOON DILATION RETROGRADE URETEROGRAM ;  Surgeon: Reece Packer, MD;  Location: [place];  Service: Urology;  Laterality: N/A;  . CYTSO/  DILATATION URETHRAL STRICTURE/  BX PROSTATIC URETHRA/  REMOVAL FOREIGN BODIES  06-25-2010   [facility]  . INGUINAL HERNIA REPAIR Right 2000  . MOHS SURGERY  01/  2019    [facility]   left lower leg for SCC  . RADICAL RESECTION OF SARCOMA TUMOR   03-15-2017   [facility]   RIGHT LOWER LEG , CALF AREA  . RADIOACTIVE SEED IMPLANTS, PROSTATE  JAN  2003  . SKIN LESION EXCISION  07/2012   MOST  right shoulder   Family History  Problem Relation Age of Onset  . Stroke Father    Social History   Socioeconomic History  . Marital status: Married    Spouse name: Not on file  . Number of children: 4  . Years of education: college  . Highest education level: Not on file  Occupational History  . Occupation: retired  Scientific laboratory technician  . Financial resource strain: Not hard at all  . Food insecurity:    Worry: Never true    Inability: Never true  . Transportation needs:    Medical: No    Non-medical: No  Tobacco Use  . Smoking status: Never Smoker  . Smokeless tobacco: Never Used  Substance and Sexual Activity  . Alcohol use: Yes    Alcohol/week: 4.2 oz    Types: 7 Glasses of wine per week    Comment: 8oz, glass or red wine  . Drug use: No  . Sexual activity: Not  on file  Lifestyle  . Physical activity:    Days per week: 3 days    Minutes per session: 30 min  . Stress: Only a little  Relationships  . Social connections:    Talks on phone: More than three times a week    Gets together: More than three times a week    Attends religious service: Never    Active member of club or organization: No    Attends meetings of clubs or organizations: Never    Relationship status: Married  Other Topics Concern  . 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     Outpatient Encounter Medications as of 08/08/2017  Medication Sig  . buPROPion (WELLBUTRIN) 100 MG tablet TAKE 1 TABLET BY MOUTH TWICE DAILY  . cholecalciferol (VITAMIN D) 1000 units tablet Take 2,000 Units by mouth daily.  . Coenzyme Q10 (COQ10) 100 MG CAPS Take 100 mg by mouth daily. Take one daily  . Cyanocobalamin-Methylcobalamin 600-600 MCG SUBL Place under the tongue daily.  Marland Kitchen DIGESTIVE ENZYMES PO Take 500 mg by mouth every morning.   . fish oil-omega-3 fatty acids 1000 MG capsule Take 2 g by mouth daily. Name:  Natural Factors Omega-3  . lisinopril (PRINIVIL,ZESTRIL) 10 MG tablet Take 10 mg by mouth daily.  . Multiple Vitamins-Minerals (ICAPS AREDS 2 PO) Take 2 tablets by mouth daily.  . NP THYROID 90 MG tablet Take 90 mg by mouth daily.  . predniSONE (DELTASONE) 5 MG tablet Take 5 mg by mouth daily with breakfast.  . RESVERATROL PO Take 100 mg by mouth 2 (two) times daily.   . TURMERIC CURCUMIN PO Take 2 tablets by mouth daily.   Marland Kitchen UNABLE TO FIND [place] Cage Gems take once daily  . UNABLE TO FIND [place] Bone take once daily  . UNABLE TO FIND [place] take 4 tablets once daily  . UNABLE TO FIND Comprehensive immune support take 2 tablets once daily  . UNABLE TO FIND Modified citrus pectin take 4 tablets once daily  . UNABLE TO FIND Celery seed take  2 tablets by mouth once daily   . UNABLE TO FIND Cataplex B take 4 tablets once daily  . UNABLE TO FIND [place] plus take 4 tablets by mouth once daily  . UNABLE TO FIND Cataplex C take 4 tablets once daily  . UNABLE TO FIND Cataplex E take 4 tablets once daily  . UNABLE TO FIND Arginex take 4 tablets once daily  . UNABLE TO FIND Min-Tran take 4 tablet once daily  . UNABLE TO FIND Immuplex take 4 tablets once daily   No facility-administered encounter medications on file as of 08/08/2017.     Activities of Daily Living In your present state of health, do you have any difficulty performing the following activities: 08/08/2017 03/30/2017  Hearing? N Y  Comment - a little  Vision? N N  Difficulty concentrating or making decisions? N N  Walking or climbing stairs? N N  Dressing or bathing? N N  Doing errands, shopping? N -  Preparing Food and eating ? N -  Using the Toilet? N -  In the past six months, have you accidently leaked urine? N -  Do you have problems with loss of bowel control? N -  Managing your Medications? N -  Managing your Finances? N -  Housekeeping or managing your Housekeeping? N -  Some recent data might be hidden    Patient Care Team: Gayland Curry, DO as PCP - General (Geriatric Medicine) Bjorn Loser, MD as Consulting Physician (Urology) [facility], Youlanda Roys, MD (Radiation Oncology) Jacelyn Pi, MD as Consulting Physician (Endocrinology) Cherre Blanc Mayford Knife, MD as Referring Physician (Orthopedic Surgery)   Assessment:   This is a routine wellness examination for [place].  Exercise Activities and Dietary recommendations Current Exercise Habits: Home exercise routine, Type of exercise: strength training/weights;Other - see comments(table tennis), Time (Minutes): 30, Frequency (Times/Week): 3, Weekly Exercise (Minutes/Week): 90, Intensity: Mild, Exercise limited by: None identified  Goals    None      Fall Risk Fall Risk  08/08/2017 07/10/2017  07/05/2017 09/28/2016 08/02/2016  Falls in the past year? No No No No Yes  Number falls in past yr: - - - - 1  Injury with Fall? - - - - Yes  Comment - - - - dislocated shoulder   Is the patient's home free of loose throw rugs in walkways, pet beds, electrical cords, etc?   yes      Grab bars in the bathroom? yes      Handrails on the stairs?   yes      Adequate lighting?   yes  Depression Screen PHQ 2/9 Scores 08/08/2017 07/05/2017 09/28/2016 08/02/2016  PHQ - 2 Score 0 0 0 0    Cognitive Function MMSE - Mini Mental State Exam 08/08/2017 08/02/2016 05/20/2015  Orientation to time 5 4 5   Orientation to Place 5 5 5   Registration 3 3 3   Attention/ Calculation 5 5 5   Recall 3 3 3   Language- name 2 objects 2 2 2   Language- repeat 1 1 1   Language- follow 3 step command 3 3 3   Language- read & follow direction 1 1 1   Write a sentence 1 1 1   Copy design 1 1 1   Total score 30 29 30         Immunization History  Administered Date(s) Administered  . Pneumococcal Conjugate-13 05/06/2015  . Pneumococcal Polysaccharide-23 01/18/2003  . Td 01/18/2003    Qualifies for Shingles Vaccine? Due, educated and declined  Screening Tests Health Maintenance  Topic Date Due  . TETANUS/TDAP  01/17/2013  . INFLUENZA VACCINE  08/17/2017  . PNA vac Low Risk Adult  Completed   Cancer Screenings: Lung: Low Dose CT Chest recommended if Age 30-80 years, 30 pack-year currently smoking OR have quit w/in 15years. Patient does not qualify. Colorectal: up to date  Additional Screenings:  Hepatitis C Screening:declined TDAP due: declined      Plan:  I have personally reviewed and addressed the Medicare Annual Wellness questionnaire and have noted the following in the patient's chart:  A. Medical and social history B. Use of alcohol, tobacco or illicit drugs  C. Current medications and supplements D. Functional ability and status E.  Nutritional status F.  Physical activity G. Advance  directives H. List of other physicians I.  Hospitalizations, surgeries, and ER visits in previous 12 months J.  [place] to include hearing, vision, cognitive, depression L. Referrals and appointments - none  In addition, I have reviewed and discussed with patient certain preventive protocols, quality metrics, and best practice recommendations. A written personalized care plan for preventive services as well as general preventive health recommendations were provided to patient.  See attached scanned questionnaire for additional information.   Signed,   Tyson Dense, RN Nurse Health Advisor  Patient concerns: Sometimes feels a little lightheaded in the mornings

## 2017-08-08 NOTE — Patient Instructions (Signed)
Philip [name] , Thank you for taking time to come for your Medicare Wellness Visit. I appreciate your ongoing commitment to your health goals. Please review the following plan we discussed and let me know if I can assist you in the future.   Screening recommendations/referrals: Colonoscopy excluded, over age 82 Recommended yearly ophthalmology/optometry visit for glaucoma screening and checkup Recommended yearly dental visit for hygiene and checkup  Vaccinations: Influenza vaccine up to date, due 2019 fall season Pneumococcal vaccine up to date, completed Tdap vaccine due, declined Shingles vaccine due, declined    Advanced directives: in chart  Conditions/risks identified: none  Next appointment: Dr. Mariea Clonts 11/01/2017 @ 3:30pm  Preventive Care 80 Years and Older, Male Preventive care refers to lifestyle choices and visits with your health care provider that can promote health and wellness. What does preventive care include?  A yearly physical exam. This is also called an annual well check.  Dental exams once or twice a year.  Routine eye exams. Ask your health care provider how often you should have your eyes checked.  Personal lifestyle choices, including:  Daily care of your teeth and gums.  Regular physical activity.  Eating a healthy diet.  Avoiding tobacco and drug use.  Limiting alcohol use.  Practicing safe sex.  Taking low doses of aspirin every day.  Taking vitamin and mineral supplements as recommended by your health care provider. What happens during an annual well check? The services and screenings done by your health care provider during your annual well check will depend on your age, overall health, lifestyle risk factors, and family history of disease. Counseling  Your health care provider may ask you questions about your:  Alcohol use.  Tobacco use.  Drug use.  Emotional well-being.  Home and relationship well-being.  Sexual  activity.  Eating habits.  History of falls.  Memory and ability to understand (cognition).  Work and work Statistician. Screening  You may have the following tests or measurements:  Height, weight, and BMI.  Blood pressure.  Lipid and cholesterol levels. These may be checked every 5 years, or more frequently if you are over 12 years old.  Skin check.  Lung cancer screening. You may have this screening every year starting at age 82 if you have a 30-pack-year history of smoking and currently smoke or have quit within the past 15 years.  Fecal occult blood test (FOBT) of the stool. You may have this test every year starting at age 14.  Flexible sigmoidoscopy or colonoscopy. You may have a sigmoidoscopy every 5 years or a colonoscopy every 10 years starting at age 39.  Prostate cancer screening. Recommendations will vary depending on your family history and other risks.  Hepatitis C blood test.  Hepatitis B blood test.  Sexually transmitted disease (STD) testing.  Diabetes screening. This is done by checking your blood sugar (glucose) after you have not eaten for a while (fasting). You may have this done every 1-3 years.  Abdominal aortic aneurysm (AAA) screening. You may need this if you are a current or former smoker.  Osteoporosis. You may be screened starting at age 44 if you are at high risk. Talk with your health care provider about your test results, treatment options, and if necessary, the need for more tests. Vaccines  Your health care provider may recommend certain vaccines, such as:  Influenza vaccine. This is recommended every year.  Tetanus, diphtheria, and acellular pertussis (Tdap, Td) vaccine. You may need a Td booster every 10  years.  Zoster vaccine. You may need this after age 92.  Pneumococcal 13-valent conjugate (PCV13) vaccine. One dose is recommended after age 61.  Pneumococcal polysaccharide (PPSV23) vaccine. One dose is recommended after age  72. Talk to your health care provider about which screenings and vaccines you need and how often you need them. This information is not intended to replace advice given to you by your health care provider. Make sure you discuss any questions you have with your health care provider. Document Released: 01/30/2015 Document Revised: 09/23/2015 Document Reviewed: 11/04/2014 Elsevier Interactive Patient Education  2017 [place] Prevention in the Home Falls can cause injuries. They can happen to people of all ages. There are many things you can do to make your home safe and to help prevent falls. What can I do on the outside of my home?  Regularly fix the edges of walkways and driveways and fix any cracks.  Remove anything that might make you trip as you walk through a door, such as a raised step or threshold.  Trim any bushes or trees on the path to your home.  Use bright outdoor lighting.  Clear any walking paths of anything that might make someone trip, such as rocks or tools.  Regularly check to see if handrails are loose or broken. Make sure that both sides of any steps have handrails.  Any raised decks and porches should have guardrails on the edges.  Have any leaves, snow, or ice cleared regularly.  Use sand or salt on walking paths during winter.  Clean up any spills in your garage right away. This includes oil or grease spills. What can I do in the bathroom?  Use night lights.  Install grab bars by the toilet and in the tub and shower. Do not use towel bars as grab bars.  Use non-skid mats or decals in the tub or shower.  If you need to sit down in the shower, use a plastic, non-slip stool.  Keep the floor dry. Clean up any water that spills on the floor as soon as it happens.  Remove soap buildup in the tub or shower regularly.  Attach bath mats securely with double-sided non-slip rug tape.  Do not have throw rugs and other things on the floor that can make  you trip. What can I do in the bedroom?  Use night lights.  Make sure that you have a light by your bed that is easy to reach.  Do not use any sheets or blankets that are too big for your bed. They should not hang down onto the floor.  Have a firm chair that has side arms. You can use this for support while you get dressed.  Do not have throw rugs and other things on the floor that can make you trip. What can I do in the kitchen?  Clean up any spills right away.  Avoid walking on wet floors.  Keep items that you use a lot in easy-to-reach places.  If you need to reach something above you, use a strong step stool that has a grab bar.  Keep electrical cords out of the way.  Do not use floor polish or wax that makes floors slippery. If you must use wax, use non-skid floor wax.  Do not have throw rugs and other things on the floor that can make you trip. What can I do with my stairs?  Do not leave any items on the stairs.  Make sure that  there are handrails on both sides of the stairs and use them. Fix handrails that are broken or loose. Make sure that handrails are as long as the stairways.  Check any carpeting to make sure that it is firmly attached to the stairs. Fix any carpet that is loose or worn.  Avoid having throw rugs at the top or bottom of the stairs. If you do have throw rugs, attach them to the floor with carpet tape.  Make sure that you have a light switch at the top of the stairs and the bottom of the stairs. If you do not have them, ask someone to add them for you. What else can I do to help prevent falls?  Wear shoes that:  Do not have high heels.  Have rubber bottoms.  Are comfortable and fit you well.  Are closed at the toe. Do not wear sandals.  If you use a stepladder:  Make sure that it is fully opened. Do not climb a closed stepladder.  Make sure that both sides of the stepladder are locked into place.  Ask someone to hold it for you, if  possible.  Clearly mark and make sure that you can see:  Any grab bars or handrails.  First and last steps.  Where the edge of each step is.  Use tools that help you move around (mobility aids) if they are needed. These include:  Canes.  Walkers.  Scooters.  Crutches.  Turn on the lights when you go into a dark area. Replace any light bulbs as soon as they burn out.  Set up your furniture so you have a clear path. Avoid moving your furniture around.  If any of your floors are uneven, fix them.  If there are any pets around you, be aware of where they are.  Review your medicines with your doctor. Some medicines can make you feel dizzy. This can increase your chance of falling. Ask your doctor what other things that you can do to help prevent falls. This information is not intended to replace advice given to you by your health care provider. Make sure you discuss any questions you have with your health care provider. Document Released: 10/30/2008 Document Revised: 06/11/2015 Document Reviewed: 02/07/2014 Elsevier Interactive Patient Education  2017 [employer].

## 2017-08-17 DIAGNOSIS — R3914 Feeling of incomplete bladder emptying: Secondary | ICD-10-CM | POA: Diagnosis not present

## 2017-08-17 DIAGNOSIS — N3020 Other chronic cystitis without hematuria: Secondary | ICD-10-CM | POA: Diagnosis not present

## 2017-08-24 ENCOUNTER — Other Ambulatory Visit: Payer: Self-pay

## 2017-08-24 ENCOUNTER — Encounter (HOSPITAL_COMMUNITY): Payer: Self-pay | Admitting: Emergency Medicine

## 2017-08-24 ENCOUNTER — Emergency Department (HOSPITAL_COMMUNITY)
Admission: EM | Admit: 2017-08-24 | Discharge: 2017-08-25 | Disposition: A | Discharge status: Home / Self Care | Payer: Medicare Other | Attending: Emergency Medicine | Admitting: Emergency Medicine

## 2017-08-24 DIAGNOSIS — Z79899 Other long term (current) drug therapy: Secondary | ICD-10-CM | POA: Diagnosis not present

## 2017-08-24 DIAGNOSIS — R509 Fever, unspecified: Secondary | ICD-10-CM | POA: Diagnosis present

## 2017-08-24 DIAGNOSIS — E039 Hypothyroidism, unspecified: Secondary | ICD-10-CM | POA: Insufficient documentation

## 2017-08-24 DIAGNOSIS — Z85828 Personal history of other malignant neoplasm of skin: Secondary | ICD-10-CM | POA: Diagnosis not present

## 2017-08-24 DIAGNOSIS — N3000 Acute cystitis without hematuria: Secondary | ICD-10-CM

## 2017-08-24 DIAGNOSIS — Z8546 Personal history of malignant neoplasm of prostate: Secondary | ICD-10-CM | POA: Insufficient documentation

## 2017-08-24 DIAGNOSIS — Z8521 Personal history of malignant neoplasm of larynx: Secondary | ICD-10-CM | POA: Insufficient documentation

## 2017-08-24 DIAGNOSIS — Z85831 Personal history of malignant neoplasm of soft tissue: Secondary | ICD-10-CM | POA: Diagnosis not present

## 2017-08-24 LAB — CBC WITH DIFFERENTIAL/PLATELET
ABS IMMATURE GRANULOCYTES: 0 10*3/uL (ref 0.0–0.1)
BASOS ABS: 0 10*3/uL (ref 0.0–0.1)
Basophils Relative: 0 %
Eosinophils Absolute: 0.1 10*3/uL (ref 0.0–0.7)
Eosinophils Relative: 0 %
HEMATOCRIT: 42.7 % (ref 39.0–52.0)
HEMOGLOBIN: 14.3 g/dL (ref 13.0–17.0)
Immature Granulocytes: 0 %
LYMPHS ABS: 1.1 10*3/uL (ref 0.7–4.0)
LYMPHS PCT: 9 %
MCH: 32.6 pg (ref 26.0–34.0)
MCHC: 33.5 g/dL (ref 30.0–36.0)
MCV: 97.5 fL (ref 78.0–100.0)
MONO ABS: 0.9 10*3/uL (ref 0.1–1.0)
MONOS PCT: 7 %
NEUTROS ABS: 11.2 10*3/uL — AB (ref 1.7–7.7)
Neutrophils Relative %: 84 %
Platelets: 177 10*3/uL (ref 150–400)
RBC: 4.38 MIL/uL (ref 4.22–5.81)
RDW: 12.6 % (ref 11.5–15.5)
WBC: 13.4 10*3/uL — ABNORMAL HIGH (ref 4.0–10.5)

## 2017-08-24 LAB — COMPREHENSIVE METABOLIC PANEL
ALBUMIN: 3.5 g/dL (ref 3.5–5.0)
ALT: 17 U/L (ref 0–44)
ANION GAP: 12 (ref 5–15)
AST: 20 U/L (ref 15–41)
Alkaline Phosphatase: 51 U/L (ref 38–126)
BUN: 24 mg/dL — ABNORMAL HIGH (ref 8–23)
CO2: 25 mmol/L (ref 22–32)
Calcium: 8.9 mg/dL (ref 8.9–10.3)
Chloride: 100 mmol/L (ref 98–111)
Creatinine, Ser: 1.75 mg/dL — ABNORMAL HIGH (ref 0.61–1.24)
GFR calc Af Amer: 40 mL/min — ABNORMAL LOW (ref >60)
GFR calc non Af Amer: 34 mL/min — ABNORMAL LOW (ref >60)
GLUCOSE: 120 mg/dL — AB (ref 70–99)
POTASSIUM: 4.1 mmol/L (ref 3.5–5.1)
Sodium: 137 mmol/L (ref 135–145)
TOTAL PROTEIN: 6.7 g/dL (ref 6.5–8.1)
Total Bilirubin: 1.2 mg/dL (ref 0.3–1.2)

## 2017-08-24 LAB — URINALYSIS, ROUTINE W REFLEX MICROSCOPIC
BILIRUBIN URINE: NEGATIVE
GLUCOSE, UA: NEGATIVE mg/dL
Ketones, ur: 5 mg/dL — AB
Nitrite: NEGATIVE
PH: 6 (ref 5.0–8.0)
Protein, ur: 100 mg/dL — AB
SPECIFIC GRAVITY, URINE: 1.015 (ref 1.005–1.030)

## 2017-08-24 LAB — I-STAT CG4 LACTIC ACID, ED: Lactic Acid, Venous: 0.92 mmol/L (ref 0.5–1.9)

## 2017-08-24 MED ORDER — SODIUM CHLORIDE 0.9 % IV BOLUS
1000.0000 mL | Freq: Once | INTRAVENOUS | Status: AC
  Administered 2017-08-25: 1000 mL via INTRAVENOUS

## 2017-08-24 MED ORDER — CIPROFLOXACIN IN D5W 400 MG/200ML IV SOLN
400.0000 mg | Freq: Once | INTRAVENOUS | Status: AC
  Administered 2017-08-25: 400 mg via INTRAVENOUS
  Filled 2017-08-24: qty 200

## 2017-08-24 NOTE — ED Triage Notes (Addendum)
Pt states he was unable to sleep last night and has generally felt unwell. Took a nap this afternoon and felt worse after that. Wife states temp was 102 at home. Afebrile. No urinary symptoms. Generally just a feeling of malaise. Has not taken his thyroid medication for a few days.  Called his PCP and states he needs to be evaluated. Wife states this is how he acts when he has a UTI.  Pt does self catheterize and has done so for 16 years.

## 2017-08-24 NOTE — ED Notes (Signed)
Patient's wife up to desk.  States she thinks maybe she should take her husband home now due to him not having a fever upon check in.  Due to his lab results strongly encouraged wife to stay and wait for possible hallway bed for MD to assess patient.  Will attempt for rooming.

## 2017-08-25 DIAGNOSIS — N3000 Acute cystitis without hematuria: Secondary | ICD-10-CM | POA: Diagnosis not present

## 2017-08-25 MED ORDER — CIPROFLOXACIN HCL 500 MG PO TABS: 500.0000 mg | ORAL_TABLET | Freq: Two times a day (BID) | ORAL | 0 refills | Status: DC

## 2017-08-25 NOTE — ED Provider Notes (Signed)
[facility] EMERGENCY DEPARTMENT Provider Note   CSN: [id number] Arrival date & time: 08/24/17  2120     History   Chief Complaint Chief Complaint  Patient presents with  . Fever    HPI Philip [name] is a 84 y.o. male.  HPI Patient is an 84 year old male with a history of urinary self-catheterization who presents to the emergency department tonight with reported fever to 102 at home and feeling somewhat sluggish this afternoon.  No chest pain or cough.  No neck pain.  No headache.  No abdominal pain.  No flank pain.  Some mild suprapubic discomfort.  Wife reports this is how he feels and ask sometimes when he develops a urinary tract infection.  Symptoms are moderate in severity.   Past Medical History:  Diagnosis Date  . Aneurysm of left renal artery (HCC)    per CT 08-20-2015 stable 83mm  . 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 Bladder neck contracture   . BPH (benign prostatic hyperplasia)   . CKD (chronic kidney disease), stage III ([place])   . Complication of anesthesia    post op acute urinary retention  . DDD (degenerative disc disease)   . Foley catheter in place    placed 02-29-2019 secondary to urinary retention post surgery  . Full dentures   . GERD (gastroesophageal reflux disease)   . History of external beam radiation therapy 2002   prostate cancer  and boost with radioative prostate seed implants  . History of prostate cancer DX  2002   S/P EXTERNAL RADIATION/ RADIOACTIVE SEED IMPLANTS  2003  . History of squamous cell carcinoma excision    2012--  RIGHT LOWER EXTREM;  2019 LEFT LOWER EXTREMITIY  . HTN (hypertension)   . Hyperlipemia   . Hypothyroidism    endocrinologist-  dr Chalmers Cater--- per pt takes his own thyroid med. called NP Thyroid 10 mg daily, does not take synthroid  . Large vessel vasculitis (HCC)    aortitis--- rheumotologist-  dr Charlestine Night  . Left renal  artery stenosis (HCC)    proximal per CT 08-20-2015 but patent  . Left renal atrophy   . Macular degeneration   . Nocturia   . OSA on CPAP    pt retested 11-13-2016 at [facility] w/ dr ather-- moderate to severe OSA , AHI 16.4/h/  03-24-2017 per pt is consistant using every night  . Pulmonary nodule    per CT 02-28-2017 in [place] in epic done at [place],  LUL nodule , not mets  . Renal cyst, right   . Restless legs syndrome (RLS) 10/31/2011  . S/P radiation therapy     for tonsillar cancer (head and neck) completed 01-26-2012 at [facility])  . Saliva decreased   . Sarcoma of lower extremity, right ([place]) oncolgoist-  dr Juluis Rainier ([place])   dx 02-14-2017 w/ needle core bx;  high grade pleomorphic spindle cell sarcoma , grade 2 (cT2N0M0); 03-15-2017  radical resection sarcoma tumor right lower leg  and plan radiation  . Self-catheterizes urinary bladder    QID   AND   PRN  . Tonsillar cancer ([place]) 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+) (Stage IVA)---- dx oct 2013  ----s/p concurent chemo and radiation/  ended 01-26-2012-- no surgical intervention---  residuals ( dry mouth, decreased saliva)   oncologist at [facility]--  dr brizel--  HX OF -- NO RECURRENCE  .  Urethral stricture    chronic---  post urethral dilation's  . Urinary retention with incomplete bladder emptying   . Vitamin B 12 deficiency 01/28/2011  . Vitamin D deficiency     Patient Active Problem List   Diagnosis Date Noted  . Atherosclerosis of renal artery ([place]) 07/05/2017  . Chronic kidney disease, stage 3 ([place]) 03/21/2017  . Complication of skin graft 03/16/2017  . Sarcoma of lower extremity, right ([place]) 02/28/2017  . Squamous cell carcinoma, leg, right 01/25/2017  . Large vessel vasculitis ([place]) 06/10/2015  . H/O drug therapy 12/31/2013  . Hyperlipidemia 11/04/2013  . Delayed effect of radiation 05/06/2013  . Depression 04/01/2013  . Male sexual dysfunction 01/20/2013  .  Fatigue 10/22/2012  . Hypothyroidism 07/23/2012  . History of prostate cancer   . HTN (hypertension)   . Tonsillar cancer ([place])   . Binocular vision disorder with diplopia 07/10/2012  . PLMD (periodic limb movement disorder) 09/13/2011  . OSA (obstructive sleep apnea) 09/13/2011  . Actinic keratosis 01/28/2011  . Vitamin B 12 deficiency 01/28/2011  . Spondylosis 08/02/2010  . Insomnia 08/02/2010    Past Surgical History:  Procedure Laterality Date  . BALLOON DILATION N/A 11/10/2014   Procedure: CYSTO BALLOON DILATION AND RETROGRADE URETHROGRAM ;  Surgeon: Bjorn Loser, MD;  Location[facility];  Service: Urology;  Laterality: N/A;  . CATARACT EXTRACTION W/ INTRAOCULAR LENS  IMPLANT, BILATERAL    . CYSTO/ BALLOON DILATION OF  URETHRAL STRICTURE  12-25-2010  . CYSTOSCOPY WITH RETROGRADE URETHROGRAM N/A 03/30/2016   Procedure: CYSTOSCOPY WITH RETROGRADE URETHROGRAM AND BALLOON DILATION with cystogram;  Surgeon: Bjorn Loser, MD;  Location[facility];  Service: Urology;  Laterality: N/A;  . CYSTOSCOPY WITH URETHRAL DILATATION  05/31/2011   Procedure: CYSTOSCOPY WITH URETHRAL DILATATION;  Surgeon: Reece Packer, MD;  Location: [facility];  Service: Urology;  Laterality: N/A;  BALLOON DILATION  . CYSTOSCOPY WITH URETHRAL DILATATION N/A 09/13/2016   Procedure: CYSTOSCOPY WITH URETHRAL BALLOON DILATATION;  Surgeon: Bjorn Loser, MD;  Location: [facility];  Service: Urology;  Laterality: N/A;  . CYSTOSCOPY WITH URETHRAL DILATATION N/A 03/30/2017   Procedure: CYSTOSCOPY WITH BALLOON URETHRAL DILATATION;  Surgeon: Bjorn Loser, MD;  Location: [facility];  Service: Urology;  Laterality: N/A;  . CYSTOSCOPY/RETROGRADE/URETEROSCOPY N/A 05/22/2012   Procedure: CYSTOSCOPY BALLOON DILATION RETROGRADE URETEROGRAM ;  Surgeon: Reece Packer, MD;  Location: [facility];  Service: Urology;   Laterality: N/A;  . CYTSO/  DILATATION URETHRAL STRICTURE/  BX PROSTATIC URETHRA/  REMOVAL FOREIGN BODIES  06-25-2010   [facility]  . INGUINAL HERNIA REPAIR Right 2000  . MOHS SURGERY  01/ 2019    [facility]   left lower leg for SCC  . RADICAL RESECTION OF SARCOMA TUMOR   03-15-2017   [facility]   RIGHT LOWER LEG , CALF AREA  . RADIOACTIVE SEED IMPLANTS, PROSTATE  JAN  2003  . SKIN LESION EXCISION  07/2012   MOST  right shoulder        Home Medications    Prior to Admission medications   Medication Sig Start Date End Date Taking? Authorizing Provider  buPROPion (WELLBUTRIN) 100 MG tablet TAKE 1 TABLET BY MOUTH TWICE DAILY 05/09/17   Reed, Tiffany L, DO  cholecalciferol (VITAMIN D) 1000 units tablet Take 2,000 Units by mouth daily.    [provider]  ciprofloxacin (CIPRO) 500 MG tablet Take 1 tablet (500 mg total) by mouth 2 (two) times daily.  08/25/17   Jola Schmidt, MD  Coenzyme Q10 (COQ10) 100 MG CAPS Take 100 mg by mouth daily. Take one daily    [provider]  Cyanocobalamin-Methylcobalamin 600-600 MCG SUBL Place under the tongue daily.    [provider]  DIGESTIVE ENZYMES PO Take 500 mg by mouth every morning.     [provider]  fish oil-omega-3 fatty acids 1000 MG capsule Take 2 g by mouth daily. Name:  Natural Factors Omega-3    [provider]  lisinopril (PRINIVIL,ZESTRIL) 10 MG tablet Take 10 mg by mouth daily.    [provider]  Multiple Vitamins-Minerals (ICAPS AREDS 2 PO) Take 2 tablets by mouth daily.    [provider]  NP THYROID 90 MG tablet Take 90 mg by mouth daily. 06/14/17   [provider]  predniSONE (DELTASONE) 5 MG tablet Take 5 mg by mouth daily with breakfast.    [provider]  RESVERATROL PO Take 100 mg by mouth 2 (two) times daily.     [provider]  TURMERIC CURCUMIN PO Take 2 tablets by mouth daily.     [provider]  UNABLE TO [place] take once daily     [provider]  Daniel Bone take once daily    [provider]  UNABLE TO FIND [place] take 4 tablets once daily    [provider]  UNABLE TO FIND Comprehensive immune support take 2 tablets once daily    [provider]  UNABLE TO FIND Modified citrus pectin take 4 tablets once daily    [provider]  UNABLE TO FIND Celery seed take 2 tablets by mouth once daily    [provider]  UNABLE TO FIND Cataplex B take 4 tablets once daily    [provider]  UNABLE TO FIND Cyruta plus take 4 tablets by mouth once daily    [provider]  UNABLE TO FIND Cataplex C take 4 tablets once daily    [provider]  UNABLE TO FIND Cataplex E take 4 tablets once daily    [provider]  UNABLE TO FIND Arginex take 4 tablets once daily    [provider]  UNABLE TO FIND Min-Tran take 4 tablet once daily    [provider]  UNABLE TO FIND Immuplex take 4 tablets once daily    [provider]    Family History Family History  Problem Relation Age of Onset  . Stroke Father     Social History Social History   Tobacco Use  . Smoking status: Never Smoker  . Smokeless tobacco: Never Used  Substance Use Topics  . Alcohol use: Yes    Alcohol/week: 7.0 standard drinks    Types: 7 Glasses of wine per week    Comment: 8oz, glass or red wine  . Drug use: No     Allergies   Contrast media [iodinated diagnostic agents]; Nitrofurantoin; Nsaids; Cephalosporins; and Suprax [cefixime]   Review of Systems Review of Systems  All other systems reviewed and are negative.    Physical Exam Updated Vital Signs BP (!) 152/94 (BP Location: Right Arm)   Pulse 70   Temp 98.5 F (36.9 C) (Oral)   Resp 16   Ht 5\' 11"  (1.803 m)   Wt 68.9 kg   SpO2 100%   BMI 21.20 kg/m   Physical Exam  Constitutional: He is oriented to person, place, and  time. He appears  well-developed and well-nourished.  HENT:  Head: Normocephalic and atraumatic.  Eyes: EOM are normal.  Neck: Normal range of motion.  Cardiovascular: Normal rate, regular rhythm, normal heart sounds and intact distal pulses.  Pulmonary/Chest: Effort normal and breath sounds normal. No respiratory distress.  Abdominal: Soft. He exhibits no distension. There is no tenderness.  Musculoskeletal: Normal range of motion.  Neurological: He is alert and oriented to person, place, and time.  Skin: Skin is warm and dry.  Psychiatric: He has a normal mood and affect. Judgment normal.  Nursing note and vitals reviewed.    ED Treatments / Results  Labs (all labs ordered are listed, but only abnormal results are displayed) Labs Reviewed  URINALYSIS, ROUTINE W REFLEX MICROSCOPIC - Abnormal; Notable for the following components:      Result Value   APPearance HAZY (*)    Hgb urine dipstick LARGE (*)    Ketones, ur 5 (*)    Protein, ur 100 (*)    Leukocytes, UA LARGE (*)    WBC, UA >50 (*)    Bacteria, UA MANY (*)    All other components within normal limits  COMPREHENSIVE METABOLIC PANEL - Abnormal; Notable for the following components:   Glucose, Bld 120 (*)    BUN 24 (*)    Creatinine, Ser 1.75 (*)    GFR calc non Af Amer 34 (*)    GFR calc Af Amer 40 (*)    All other components within normal limits  CBC WITH DIFFERENTIAL/PLATELET - Abnormal; Notable for the following components:   WBC 13.4 (*)    Neutro Abs 11.2 (*)    All other components within normal limits  URINE CULTURE  I-STAT CG4 LACTIC ACID, ED  I-STAT CG4 LACTIC ACID, ED    EKG None  Radiology No results found.  Procedures Procedures (including critical care time)  Medications Ordered in ED Medications  ciprofloxacin (CIPRO) IVPB 400 mg (0 mg Intravenous Stopped 08/25/17 0232)  sodium chloride 0.9 % bolus 1,000 mL (0 mLs Intravenous Stopped 08/25/17 0232)     Initial Impression / Assessment and Plan / ED Course    I have reviewed the triage vital signs and the nursing notes.  Pertinent labs & imaging results that were available during my care of the patient were reviewed by me and considered in my medical decision making (see chart for details).     Urine culture sent.  Appears to be acute cystitis.  Vital signs are stable.  Dose of IV ciprofloxacin was given in the emergency department.  Cipro was chosen based on prior urine cultures reviewed in the electronic medical record.  Patient will be discharged home with ciprofloxacin.  Patient and wife are instructed to return the emergency department for any new or worsening symptoms.  Close follow-up with his primary care physician and his urologist.  Final Clinical Impressions(s) / ED Diagnoses   Final diagnoses:  Acute cystitis without hematuria    ED Discharge Orders         Ordered    ciprofloxacin (CIPRO) 500 MG tablet  2 times daily,   Status:  Discontinued     08/25/17 0233    ciprofloxacin (CIPRO) 500 MG tablet  2 times daily     08/25/17 0234           Jola Schmidt, MD 08/25/17 0236

## 2017-08-27 LAB — URINE CULTURE: Culture: >=100000 — AB

## 2017-08-28 ENCOUNTER — Telehealth: Payer: Self-pay | Admitting: Emergency Medicine

## 2017-08-28 NOTE — Telephone Encounter (Signed)
Post ED Visit - Positive Culture Follow-up  Culture report reviewed by antimicrobial stewardship pharmacist:  []  Elenor Quinones, Pharm.D. []  Heide Guile, Pharm.D., BCPS AQ-ID []  Parks Neptune, Pharm.D., BCPS []  Alycia Rossetti, Pharm.D., BCPS []  [place], Pharm.D., BCPS, AAHIVP []  Legrand Como, Pharm.D., BCPS, AAHIVP []  Salome Arnt, PharmD, BCPS []  Johnnette Gourd, PharmD, BCPS []  Hughes Better, PharmD, BCPS []  Leeroy Cha, PharmD Marzetta Merino PharmD  Positive urine culture Treated with ciprofloxacin, organism sensitive to the same and no further patient follow-up is required at this time.  Hazle Nordmann 08/28/2017, 10:52 AM

## 2017-09-04 DIAGNOSIS — D3132 Benign neoplasm of left choroid: Secondary | ICD-10-CM | POA: Diagnosis not present

## 2017-09-04 DIAGNOSIS — H353133 Nonexudative age-related macular degeneration, bilateral, advanced atrophic without subfoveal involvement: Secondary | ICD-10-CM | POA: Diagnosis not present

## 2017-09-04 DIAGNOSIS — H43813 Vitreous degeneration, bilateral: Secondary | ICD-10-CM | POA: Diagnosis not present

## 2017-09-04 DIAGNOSIS — H52203 Unspecified astigmatism, bilateral: Secondary | ICD-10-CM | POA: Diagnosis not present

## 2017-09-05 DIAGNOSIS — C4921 Malignant neoplasm of connective and soft tissue of right lower limb, including hip: Secondary | ICD-10-CM | POA: Diagnosis not present

## 2017-09-05 DIAGNOSIS — Z923 Personal history of irradiation: Secondary | ICD-10-CM | POA: Diagnosis not present

## 2017-09-06 ENCOUNTER — Ambulatory Visit (INDEPENDENT_AMBULATORY_CARE_PROVIDER_SITE_OTHER): Payer: Medicare Other | Admitting: Neurology

## 2017-09-06 ENCOUNTER — Encounter: Payer: Self-pay | Admitting: Neurology

## 2017-09-06 VITALS — BP 170/103 | HR 75 | Ht 71.0 in | Wt 154.0 lb

## 2017-09-06 DIAGNOSIS — Z789 Other specified health status: Secondary | ICD-10-CM | POA: Diagnosis not present

## 2017-09-06 DIAGNOSIS — I701 Atherosclerosis of renal artery: Secondary | ICD-10-CM

## 2017-09-06 DIAGNOSIS — Z9989 Dependence on other enabling machines and devices: Secondary | ICD-10-CM | POA: Diagnosis not present

## 2017-09-06 DIAGNOSIS — G4733 Obstructive sleep apnea (adult) (pediatric): Secondary | ICD-10-CM | POA: Diagnosis not present

## 2017-09-06 NOTE — Progress Notes (Signed)
Order for bipap mask refit sent to [facility].

## 2017-09-06 NOTE — Patient Instructions (Addendum)
You have not been using your CPAP. I would like to encourage you to go back on the CPAP, as you have moderate to severe sleep apnea and should not remain untreated. Other treatment options are rather limited for you.   I will request a mask refit through [facility]. You may benefit from trying a full face mask.   The address for [facility] is: [address] [phone].    You can try Melatonin at night for sleep: take 1 mg to 3 mg, one to 2 hours before your bedtime. You can go up to 5 mg if needed. It is over the counter and comes in pill form, chewable form and spray, if you prefer.

## 2017-09-06 NOTE — Progress Notes (Signed)
Subjective:    Patient ID: Philip [name] is a 84 y.o. male.  HPI     Interim history:   Philip [name] is an 84 year old right-handed gentleman with an underlying complex medical history of prostate cancer, status post seed implants, hypertension, tonsillar cancer, status post XRT, restless leg syndrome, vitamin D deficiency, vitamin B12 deficiency, hyperlipidemia, degenerative disc disease, squamous cell cancer, hypothyroidism, reflux disease, macular degeneration, who presents for follow-up consultation of his obstructive sleep apnea, on CPAP therapy. The patient is unaccompanied today. I last saw him on 03/08/2017, at which time he was compliant with CPAP. His air leak was consistently high and I suggested reduction of his set pressure to 9 cm. Unfortunately, he had recently been diagnosed with sarcoma. He was scheduled for surgery at [facility] soon. I suggested we reduce his pressure to 9 cm.   Today, 09/06/2017: I reviewed his CPAP compliance data from 06/16/2017 through 07/15/2017, which is a total of 30 days, during which time he used his CPAP 18 days. There was no data available sense 07/05/2017. He reports that he stopped using his CPAP because he did not feel any better. He essentially gave up on it. He does not sleep well. He also reports that he had discomfort with a nasal pillows. He would be willing to get back on it and try a different mask. He may have mouth opening without realizing it. Last time he did complain about mouth dryness and we reduced the pressure from 10 cm to 9 cm at the time. Of note, he had radical resection of his right leg sarcoma on 03/15/2017 and had subsequent XRT which he finished in May 2019.   The patient's allergies, current medications, family history, past medical history, past social history, past surgical history and problem list were reviewed and updated as appropriate.    Previously (copied from previous notes for reference):   I saw him on 10/10/2016, at  which time he reported having difficulty with sleep maintenance. He had some intermittent restless leg symptoms. He had intermittent vertigo. He had a prior diagnosis of OSA but had CPAP intolerance at the time, likely also tried BiPAP in the past. I invited him for sleep study testing. He had a baseline sleep study, followed by a CPAP titration study. I went over his test results with him in detail today. Baseline sleep study from 11/13/2016 showed a sleep efficiency reduced at 67.2%, sleep latency of 50 minutes which is delayed and REM latency was delayed at 159 minutes. He had an increased percentage of stage II sleep, slow-wave sleep was absent, REM sleep was normal at 21.1%. Total AHI was 16.4 per hour, REM AHI 39.4 per hour, supine AHI 34.2 per hour, average oxygen saturation was 96%, nadir was 66%. He had borderline PLMS. Based on his medical history and sleep related complaints as well as test results with moderate to severe obstructive sleep apnea I asked him to return for a full night CPAP titration study. He had this on 12/19/2016. Sleep efficiency was 86.5%, sleep latency 15.5 minutes and REM latency 178 minutes. He had an increased percentage of stage II sleep, slow-wave sleep was absent, REM sleep was 10.6%. He was fitted with medium nasal pillows and CPAP was titrated from 5 cm to 10 cm. On the final pressure his AHI was 0 per hour with supine non-REM sleep achieved an O2 nadir of 91%. He had severe PLMS with an index of 98.9 per hour and an associated arousal index of  17 per hour.   I reviewed his CPAP compliance data from 02/05/2017 through 03/06/2017 which is a total of 30 days, during which time he used his CPAP 26 days with percent used days greater than 4 hours at 83%, indicating very good compliance with an average usage of 9 hours and 0 minutes, residual AHI at goal at 3 per hour, leak high with the 95th percentile at 41.4 L/m on a pressure of 10 cm with EPR of 3.    I first met him on  05/12/2015 at the request of his primary care physician, at which time he reported a prior diagnosis of obstructive sleep apnea. He was complaining about nonrestorative sleep and not be able to sleep through the night. He struggled with CPAP and could not tolerate it, apparently also tried BiPAP. He was invited for a sleep study but did not schedule it at the time. He returns for reevaluation.    05/12/2015: (He) was previously diagnosed with obstructive sleep apnea. I reviewed his CPAP titration study from 02/25/2008, interpreted by Dr. Gwenette Greet. Sleep efficiency was 89.4%, sleep latency was 13.5 minutes, REM latency was 102 minutes. CPAP was titrated from 4 cm to 11 cm. PLM index was 69.7/h.  He had a baseline sleep study on 01/27/2008. This was interpreted by Dr. Mickeal Skinner and I reviewed the results. AHI was 18.6 per hour. Average oxygen saturation was 89%, nadir was 85%. PLM index was 24.6 per hour. Sleep efficiency was 76.6%, sleep latency 7 minutes, REM latency 162.5 minutes.    His main complaint about his sleep is nonrestorative sleep and not being able to sleep through the night with at times multiple nighttime awakenings. He does not have significant difficulty falling asleep. Originally, in 2010 when he had a sleep study evaluation he was having issues with anxiety and his wife had noted pauses in his breathing at night. He struggled with the CPAP for about a year. He tried different masks and even was tried on BiPAP as I understand for about a week and could not tolerate it either. He was about 165 pounds at the time of his sleep study testing, then lost a lot of weight, and gain some back, maintaining now between higher 150s and lower 160s typically. He has no family history of OSA or restless leg syndrome. His leg twitching and kicking can be quite vehement at night and is almost nightly per wife. He takes magnesium over-the-counter for this but not every night and feels it has helped some but  would be willing to try a dopamine agonists and did some research on Requip.   He is a retired Insurance underwriter and worked for [employer] for about 30 years and has also been in [employer]. He spent some time in [place] and neuropathy. He has 4 grown children. He lives with his wife. He has 5 grandchildren. He drinks alcohol in the form of wine, 1-2 glasses per night, he is a never smoker and does not use any illicit drugs. Caffeine is in the form of coffee, usually 2 cups per day in the morning. He does not drink sodas.   He does not take any sleep aids. He likes to take over-the-counter supplements or natural medications rather than prescription medications he admits. He has his radiology oncology follow-up every 6 months at [facility] for his tonsillar cancer.   He self catheterizes, does not have to catheterize in the middle of the night. He denies morning headaches. His Epworth sleepiness score  is 3 out of 24 today, his fatigue score is 31 out of 63. He does not typically take a nap. His bedtime is around 10 PM, rise time is around 7:30 AM, he feels marginally or adequately rested on most mornings. He has occasional leg cramps.  His Past Medical History Is Significant For: Past Medical History:  Diagnosis Date  . Aneurysm of left renal artery (HCC)    per CT 08-20-2015 stable 34m  . Aortitis syndrome ([facility] rheumot[place]-  dr tCharlestine Night  IgG4 syndrome-- treatment prednisone- (effects abdomine)  . Bladder neck contracture   . BPH (benign prostatic hyperplasia)   . CKD (chronic kidney disease), stage III ([place]   . Complication of anesthesia    post op acute urinary retention  . DDD (degenerative disc disease)   . Foley catheter in place    placed 02-29-2019 secondary to urinary retention post surgery  . Full dentures   . GERD (gastroesophageal reflux disease)   . History of external beam radiation therapy 2002   prostate cancer  and boost with radioative prostate seed implants  . History of  prostate cancer DX  2002   S/P EXTERNAL RADIATION/ RADIOACTIVE SEED IMPLANTS  2003  . History of squamous cell carcinoma excision    2012--  RIGHT LOWER EXTREM;  2019 LEFT LOWER EXTREMITIY  . HTN (hypertension)   . Hyperlipemia   . Hypothyroidism    endocrinologist-  dr bChalmers Cater-- per pt takes his own thyroid med. called NP Thyroid 10 mg daily, does not take synthroid  . Large vessel vasculitis (HCC)    aortitis--- rheumotologist-  dr tCharlestine Night . Left renal artery stenosis (HCC)    proximal per CT 08-20-2015 but patent  . Left renal atrophy   . Macular degeneration   . Nocturia   . OSA on CPAP    [facility] dr ather-- moderate to severe OSA , AHI 16.4/h/  03-24-2017 per pt is consistant using every night  . Pulmonary nodule    [place]  LUL nodule , not mets  . Renal cyst, right   . Restless legs syndrome (RLS) 10/31/2011  . S/P radiation therapy     [facility]head [facility] neck[facility]  . Saliva decreased   . Sarcoma of lower extremity, right ([facility] [facility]go[facility]-  dr [place]uluis [place]([facility]   dx 02-14-2017 w/ needle core bx;  high grade pleomorphic spindle cell sarcoma , grade 2 (cT2N0M0); 03-15-2017  radical resection sarcoma tumor right lower leg  and plan radiation  . Self-catheterizes urinary bladder    QID   AND   PRN  . Tonsillar cancer ([place] 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+) (Stage IVA)---- dx oct 2013  ----s/p concurent chemo and radiation/  ended 01-26-2012-- no surgical intervention---  residuals ( dry mouth, decreased saliva)   [place]  [place]--  HX OF -- NO RECURRENCE  . Urethral stricture    chronic---  post urethral dilation's  . Urinary retention with incomplete bladder emptying   . Vitamin B 12 deficiency 01/28/2011  . Vitamin D deficiency     His Past Surgical History Is Significant For: Past Surgical  History:  Procedure Laterality Date  . BALLOON DILATION N/A 11/10/2014   Procedure: CYSTO BALLOON DILATION AND RETROGRADE URETHROGRAM ;  Surgeon: SBjorn Loser MD;  [facility]  Service:  Urology;  Laterality: N/A;  . CATARACT EXTRACTION W/ INTRAOCULAR LENS  IMPLANT, BILATERAL    . CYSTO/ BALLOON DILATION OF  URETHRAL STRICTURE  12-25-2010  . CYSTOSCOPY WITH RETROGRADE URETHROGRAM N/A 03/30/2016   Procedure: CYSTOSCOPY WITH RETROGRADE URETHROGRAM AND BALLOON DILATION with cystogram;  Surgeon: Bjorn Loser, MD;  Location[facility];  Service: Urology;  Laterality: N/A;  . CYSTOSCOPY WITH URETHRAL DILATATION  05/31/2011   Procedure: CYSTOSCOPY WITH URETHRAL DILATATION;  Surgeon: Reece Packer, MD;  Location: [place];  Service: Urology;  Laterality: N/A;  BALLOON DILATION  . CYSTOSCOPY WITH URETHRAL DILATATION N/A 09/13/2016   Procedure: CYSTOSCOPY WITH URETHRAL BALLOON DILATATION;  Surgeon: Bjorn Loser, MD;  Location: [place];  Service: Urology;  Laterality: N/A;  . CYSTOSCOPY WITH URETHRAL DILATATION N/A 03/30/2017   Procedure: CYSTOSCOPY WITH BALLOON URETHRAL DILATATION;  Surgeon: Bjorn Loser, MD;  Location: [place];  Service: Urology;  Laterality: N/A;  . CYSTOSCOPY/RETROGRADE/URETEROSCOPY N/A 05/22/2012   Procedure: CYSTOSCOPY BALLOON DILATION RETROGRADE URETEROGRAM ;  Surgeon: Reece Packer, MD;  Location: [place];  Service: Urology;  Laterality: N/A;  . CYTSO/  DILATATION URETHRAL STRICTURE/  BX PROSTATIC URETHRA/  REMOVAL FOREIGN BODIES  06-25-2010   [facility]  . INGUINAL HERNIA REPAIR Right 2000  . MOHS SURGERY  01/ 2019    [facility]   left lower leg for SCC  . RADICAL RESECTION OF SARCOMA TUMOR   03-15-2017   [facility]   RIGHT LOWER LEG , CALF AREA  . RADIOACTIVE SEED IMPLANTS, PROSTATE  JAN  2003  . SKIN LESION EXCISION  07/2012   MOST  right shoulder    His  Family History Is Significant For: Family History  Problem Relation Age of Onset  . Stroke Father     His Social History Is Significant For: Social History   Socioeconomic History  . Marital status: Married    Spouse name: Not on file  . Number of children: 4  . Years of education: college  . Highest education level: Not on file  Occupational History  . Occupation: retired  Scientific laboratory technician  . Financial resource strain: Not hard at all  . Food insecurity:    Worry: Never true    Inability: Never true  . Transportation needs:    Medical: No    Non-medical: No  Tobacco Use  . Smoking status: Never Smoker  . Smokeless tobacco: Never Used  Substance and Sexual Activity  . Alcohol use: Yes    Alcohol/week: 7.0 standard drinks    Types: 7 Glasses of wine per week    Comment: 8oz, glass or red wine  . Drug use: No  . Sexual activity: Not on file  Lifestyle  . Physical activity:    Days per week: 3 days    Minutes per session: 30 min  . Stress: Only a little  Relationships  . Social connections:    Talks on phone: More than three times a week    Gets together: More than three times a week    Attends religious service: Never    Active member of club or organization: No    Attends meetings of clubs or organizations: Never    Relationship status: Married  Other Topics Concern  . 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  His Allergies Are:  Allergies  Allergen Reactions  . Contrast Media [Iodinated Diagnostic Agents] Other (See Comments)    Avoid due to renal disease  . Nitrofurantoin Swelling  . Nsaids Other (See Comments)    Has been told no NSAIDs, Ibuprofen Renal insufficiency hx.   . Cephalosporins Rash  . Suprax [Cefixime] Rash  :   His Current Medications Are:  Outpatient Encounter Medications as of 09/06/2017  Medication Sig  . buPROPion  (WELLBUTRIN) 100 MG tablet TAKE 1 TABLET BY MOUTH TWICE DAILY  . cholecalciferol (VITAMIN D) 1000 units tablet Take 2,000 Units by mouth daily.  . Coenzyme Q10 (COQ10) 100 MG CAPS Take 100 mg by mouth daily. Take one daily  . Cyanocobalamin-Methylcobalamin 600-600 MCG SUBL Place under the tongue daily.  Marland Kitchen DIGESTIVE ENZYMES PO Take 500 mg by mouth every morning.   . fish oil-omega-3 fatty acids 1000 MG capsule Take 2 g by mouth daily. Name:  Natural Factors Omega-3  . lisinopril (PRINIVIL,ZESTRIL) 10 MG tablet Take 10 mg by mouth daily.  . Multiple Vitamins-Minerals (ICAPS AREDS 2 PO) Take 2 tablets by mouth daily.  . NP THYROID 90 MG tablet Take 90 mg by mouth daily.  . predniSONE (DELTASONE) 5 MG tablet Take 2.5 mg by mouth daily with breakfast.   . RESVERATROL PO Take 100 mg by mouth 2 (two) times daily.   . TURMERIC CURCUMIN PO Take 2 tablets by mouth daily.   Marland Kitchen UNABLE TO FIND [place] Cage Gems take once daily  . UNABLE TO FIND [place] Bone take once daily  . UNABLE TO FIND [place] take 4 tablets once daily  . UNABLE TO FIND Comprehensive immune support take 2 tablets once daily  . UNABLE TO FIND Modified citrus pectin take 4 tablets once daily  . UNABLE TO FIND Celery seed take 2 tablets by mouth once daily  . UNABLE TO FIND Cataplex B take 4 tablets once daily  . UNABLE TO FIND [place] plus take 4 tablets by mouth once daily  . UNABLE TO FIND Cataplex C take 4 tablets once daily  . UNABLE TO FIND Cataplex E take 4 tablets once daily  . UNABLE TO FIND Arginex take 4 tablets once daily  . UNABLE TO FIND Min-Tran take 4 tablet once daily  . UNABLE TO FIND Immuplex take 4 tablets once daily  . [DISCONTINUED] ciprofloxacin (CIPRO) 500 MG tablet Take 1 tablet (500 mg total) by mouth 2 (two) times daily.   No facility-administered encounter medications on file as of 09/06/2017.   :  Review of Systems:  Out of a complete 14 point review of systems, all are reviewed and negative with the  exception of these symptoms as listed below:  Review of Systems  Neurological:       Pt presents today for follow up. Pt reports that he has "given up" on cpap because it was not helpful.    Objective:  Neurological Exam  Physical Exam Physical Examination:   Vitals:   09/06/17 1036  BP: (!) 170/103  Pulse: 75    General Examination: The patient is a very pleasant 84 y.o. male in no acute distress. He appears well-developed and well-nourished and well groomed. Looks tired.   HEENT:Normocephalic, atraumatic, pupils are equal, round and reactive to light and accommodation.Corrective eye glasses.Extraocular tracking is good without limitation to gaze excursion or nystagmus noted. Normal smooth pursuit is noted. Hearing is grossly intact. Face is symmetric with normal facial animation and normal facial sensation.  Speech is clear with no dysarthria noted. There is no hypophonia. There is no lip, neck/head, jaw or voice tremor. Neck is supple with FROM. Oropharynx exam reveals: mild mouth dryness, adequate dental hygiene with partial plate in place and mild airway crowding. Tongue protrudes centrally and palate elevates symmetrically.   Chest:Clear to auscultation without wheezing, rhonchi or crackles noted.  Heart:S1+S2+0, regular and normal without murmurs, rubs or gallops noted.   Abdomen:Soft, non-tender and non-distended with normal bowel sounds appreciated on auscultation.  Extremities:There is trace pitting edema in the left ankle, 1+ edema in the right lower leg, lateral scar and missing muscle bulk from recent surgery.   Skin: Warm and dry without trophic changes noted. There are no varicose veins.  Musculoskeletal: exam reveals no obvious joint deformities, tenderness or joint swelling or erythema.   Neurologically:  Mental status: The patient is awake, alert and oriented in all 4 spheres. His immediate and remote memory, attention, language skills and fund of  knowledge are appropriate. There is no evidence of aphasia, agnosia, apraxia or anomia. Speech is clear with normal prosody and enunciation. Thought process is linear. Mood is normaland affect is normal.  Cranial nerves II - XII are as described above under HEENT exam. In addition: shoulder shrug is normal with equal shoulder height noted. Motor exam: Normal bulk, strength and tone is noted. There is no tremor. Fine motor skills and coordination: intact with normal finger taps, normal hand movements, normal rapid alternating patting, normal foot taps and normal foot agility.  Cerebellar testing: No dysmetria or intention tremor. There is no truncal or gait ataxia.  Sensory exam: intact to light touch in the upper and lower extremities.  Gait, station and balance: He stands easily. No veering to one side is noted. No leaning to one side is noted. Posture is age-appropriate and stance is narrow based. Gait shows normalstride length and normalpace. No problems turning are noted.   Assessment and Plan:  In summary, YSMAEL HIRES a very pleasant 84 year old male with an underlying complex medical history of prostate cancer, status post seed implants, hypertension, tonsillar cancer, status post XRT, restless leg syndrome, vitamin D deficiency, vitamin B12 deficiency, hyperlipidemia, degenerative disc disease, squamous cell cancer, hypothyroidism, reflux disease, macular degeneration, and R lower leg sarcoma with s/p surgery at [facility] in 02/2017 with subsequent XRT, whopresents for follow-up consultation of his obstructive sleep apnea, which was determined to be moderate to severe during his baseline sleep study in October 2018. He had a CPAP titration study in December 2018. He has been compliant with CPAP. He has had issues with increased leak and we reduced his pressure from 10 cm to 9 cm in February 2019. He was compliant with treatment until mid June or so. He reports that he gave up on it as he did  not feel any better. He is advised that untreated moderate to severe sleep apnea can increase his risk to develop cardiovascular disease. In addition, apart from CPAP therapy he does not have much in the way of good alternative treatment options. He is encouraged to get back on treatment. He is encouraged to try melatonin at night to help him ease into sleep a little better. He is commended for his treatment adherence thus far. He is advised to also make an appointment with his DME provider as his air leak is still high, AHI at goal on a pressure of 9 cm currently. He may do better with one of the newer full facemask  options. He is willing to try this. I suggested a 6 month follow-up, sooner if needed. I answered all his questions today and he was in agreement. He is commended for his treatment adherence and for his willingness to keep working on it. I spent 25 minutes in total face-to-face time with the patient, more than 50% of which was spent in counseling and coordination of care, reviewing test results, reviewing medication and discussing or reviewing the diagnosis of OSA, its prognosis and treatment options. Pertinent laboratory and imaging test results that were available during this visit with the patient were reviewed by me and considered in my medical decision making (see chart for details).

## 2017-09-07 DIAGNOSIS — E039 Hypothyroidism, unspecified: Secondary | ICD-10-CM | POA: Diagnosis not present

## 2017-09-13 DIAGNOSIS — E039 Hypothyroidism, unspecified: Secondary | ICD-10-CM | POA: Diagnosis not present

## 2017-09-14 DIAGNOSIS — R350 Frequency of micturition: Secondary | ICD-10-CM | POA: Diagnosis not present

## 2017-09-14 DIAGNOSIS — R3914 Feeling of incomplete bladder emptying: Secondary | ICD-10-CM | POA: Diagnosis not present

## 2017-09-14 DIAGNOSIS — N3020 Other chronic cystitis without hematuria: Secondary | ICD-10-CM | POA: Diagnosis not present

## 2017-09-27 DIAGNOSIS — I714 Abdominal aortic aneurysm, without rupture: Secondary | ICD-10-CM | POA: Diagnosis not present

## 2017-09-27 DIAGNOSIS — I776 Arteritis, unspecified: Secondary | ICD-10-CM | POA: Diagnosis not present

## 2017-09-27 DIAGNOSIS — R5383 Other fatigue: Secondary | ICD-10-CM | POA: Diagnosis not present

## 2017-09-27 DIAGNOSIS — M79606 Pain in leg, unspecified: Secondary | ICD-10-CM | POA: Diagnosis not present

## 2017-09-27 DIAGNOSIS — Z7952 Long term (current) use of systemic steroids: Secondary | ICD-10-CM | POA: Diagnosis not present

## 2017-09-27 DIAGNOSIS — N189 Chronic kidney disease, unspecified: Secondary | ICD-10-CM | POA: Diagnosis not present

## 2017-09-27 DIAGNOSIS — M8580 Other specified disorders of bone density and structure, unspecified site: Secondary | ICD-10-CM | POA: Diagnosis not present

## 2017-09-28 DIAGNOSIS — N183 Chronic kidney disease, stage 3 (moderate): Secondary | ICD-10-CM | POA: Diagnosis not present

## 2017-09-28 DIAGNOSIS — I701 Atherosclerosis of renal artery: Secondary | ICD-10-CM | POA: Diagnosis not present

## 2017-09-28 DIAGNOSIS — I129 Hypertensive chronic kidney disease with stage 1 through stage 4 chronic kidney disease, or unspecified chronic kidney disease: Secondary | ICD-10-CM | POA: Diagnosis not present

## 2017-09-28 DIAGNOSIS — M316 Other giant cell arteritis: Secondary | ICD-10-CM | POA: Diagnosis not present

## 2017-09-29 ENCOUNTER — Other Ambulatory Visit: Payer: Self-pay | Admitting: Rheumatology

## 2017-09-29 DIAGNOSIS — I714 Abdominal aortic aneurysm, without rupture, unspecified: Secondary | ICD-10-CM

## 2017-10-02 ENCOUNTER — Other Ambulatory Visit: Payer: Self-pay | Admitting: Nephrology

## 2017-10-02 DIAGNOSIS — N1831 Chronic kidney disease, stage 3a: Secondary | ICD-10-CM

## 2017-10-02 DIAGNOSIS — N183 Chronic kidney disease, stage 3 (moderate): Principal | ICD-10-CM

## 2017-10-21 ENCOUNTER — Other Ambulatory Visit: Payer: Self-pay | Admitting: Internal Medicine

## 2017-10-23 ENCOUNTER — Ambulatory Visit
Admission: RE | Admit: 2017-10-23 | Discharge: 2017-10-23 | Disposition: A | Discharge status: Home / Self Care | Payer: Medicare Other | Source: Ambulatory Visit | Attending: Rheumatology | Admitting: Rheumatology

## 2017-10-23 ENCOUNTER — Ambulatory Visit
Admission: RE | Admit: 2017-10-23 | Discharge: 2017-10-23 | Disposition: A | Discharge status: Home / Self Care | Payer: Medicare Other | Source: Ambulatory Visit | Attending: Nephrology | Admitting: Nephrology

## 2017-10-23 DIAGNOSIS — I714 Abdominal aortic aneurysm, without rupture, unspecified: Secondary | ICD-10-CM

## 2017-10-23 DIAGNOSIS — N2889 Other specified disorders of kidney and ureter: Secondary | ICD-10-CM | POA: Diagnosis not present

## 2017-10-23 DIAGNOSIS — N1831 Chronic kidney disease, stage 3a: Secondary | ICD-10-CM

## 2017-10-23 DIAGNOSIS — N183 Chronic kidney disease, stage 3 (moderate): Principal | ICD-10-CM

## 2017-10-24 ENCOUNTER — Encounter: Payer: Self-pay | Admitting: Internal Medicine

## 2017-10-24 DIAGNOSIS — E785 Hyperlipidemia, unspecified: Secondary | ICD-10-CM | POA: Diagnosis not present

## 2017-10-24 DIAGNOSIS — E039 Hypothyroidism, unspecified: Secondary | ICD-10-CM | POA: Diagnosis not present

## 2017-10-24 DIAGNOSIS — D649 Anemia, unspecified: Secondary | ICD-10-CM | POA: Diagnosis not present

## 2017-10-24 DIAGNOSIS — I10 Essential (primary) hypertension: Secondary | ICD-10-CM | POA: Diagnosis not present

## 2017-10-24 LAB — BASIC METABOLIC PANEL
BUN: 32 — AB (ref 4–21)
Creatinine: 1.5 — AB (ref 0.6–1.3)
Glucose: 101
Potassium: 4.8 (ref 3.4–5.3)
Sodium: 143 (ref 137–147)

## 2017-10-24 LAB — HEPATIC FUNCTION PANEL
ALT: 13 (ref 10–40)
AST: 14 (ref 14–40)
Alkaline Phosphatase: 63 (ref 25–125)
Bilirubin, Total: 0.2

## 2017-10-24 LAB — TSH: TSH: 5.89 (ref 0.41–5.90)

## 2017-10-24 LAB — CBC AND DIFFERENTIAL
HCT: 39 — AB (ref 41–53)
Hemoglobin: 13.5 (ref 13.5–17.5)
Platelets: 234 (ref 150–399)
WBC: 7.1

## 2017-10-25 DIAGNOSIS — E039 Hypothyroidism, unspecified: Secondary | ICD-10-CM | POA: Diagnosis not present

## 2017-10-27 ENCOUNTER — Encounter: Payer: Self-pay | Admitting: Internal Medicine

## 2017-11-01 ENCOUNTER — Non-Acute Institutional Stay: Payer: Medicare Other | Admitting: Internal Medicine

## 2017-11-01 ENCOUNTER — Encounter: Payer: Self-pay | Admitting: Internal Medicine

## 2017-11-01 VITALS — BP 120/70 | HR 58 | Temp 97.7°F | Ht 71.0 in | Wt 154.0 lb

## 2017-11-01 DIAGNOSIS — I10 Essential (primary) hypertension: Secondary | ICD-10-CM | POA: Diagnosis not present

## 2017-11-01 DIAGNOSIS — G4733 Obstructive sleep apnea (adult) (pediatric): Secondary | ICD-10-CM | POA: Diagnosis not present

## 2017-11-01 DIAGNOSIS — M316 Other giant cell arteritis: Secondary | ICD-10-CM

## 2017-11-01 DIAGNOSIS — N183 Chronic kidney disease, stage 3 unspecified: Secondary | ICD-10-CM

## 2017-11-01 DIAGNOSIS — I701 Atherosclerosis of renal artery: Secondary | ICD-10-CM

## 2017-11-01 DIAGNOSIS — F324 Major depressive disorder, single episode, in partial remission: Secondary | ICD-10-CM

## 2017-11-01 DIAGNOSIS — C4921 Malignant neoplasm of connective and soft tissue of right lower limb, including hip: Secondary | ICD-10-CM | POA: Diagnosis not present
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
## 2017-12-04 DIAGNOSIS — Z9889 Other specified postprocedural states: Secondary | ICD-10-CM | POA: Diagnosis not present

## 2017-12-04 DIAGNOSIS — C4921 Malignant neoplasm of connective and soft tissue of right lower limb, including hip: Secondary | ICD-10-CM | POA: Diagnosis not present

## 2017-12-04 DIAGNOSIS — R918 Other nonspecific abnormal finding of lung field: Secondary | ICD-10-CM | POA: Diagnosis not present

## 2017-12-08 DIAGNOSIS — I776 Arteritis, unspecified: Secondary | ICD-10-CM | POA: Diagnosis not present

## 2017-12-08 DIAGNOSIS — Z7952 Long term (current) use of systemic steroids: Secondary | ICD-10-CM | POA: Diagnosis not present

## 2017-12-08 DIAGNOSIS — M79606 Pain in leg, unspecified: Secondary | ICD-10-CM | POA: Diagnosis not present

## 2017-12-08 DIAGNOSIS — R5383 Other fatigue: Secondary | ICD-10-CM | POA: Diagnosis not present

## 2017-12-08 DIAGNOSIS — M8580 Other specified disorders of bone density and structure, unspecified site: Secondary | ICD-10-CM | POA: Diagnosis not present

## 2017-12-08 DIAGNOSIS — N189 Chronic kidney disease, unspecified: Secondary | ICD-10-CM | POA: Diagnosis not present

## 2017-12-11 DIAGNOSIS — N35012 Post-traumatic membranous urethral stricture: Secondary | ICD-10-CM | POA: Diagnosis not present

## 2017-12-11 DIAGNOSIS — N3020 Other chronic cystitis without hematuria: Secondary | ICD-10-CM | POA: Diagnosis not present

## 2017-12-20 ENCOUNTER — Non-Acute Institutional Stay: Payer: Medicare Other | Admitting: Internal Medicine

## 2017-12-20 ENCOUNTER — Encounter: Payer: Self-pay | Admitting: Internal Medicine

## 2017-12-20 VITALS — BP 148/80 | HR 60 | Temp 97.8°F | Ht 71.0 in | Wt 159.0 lb

## 2017-12-20 DIAGNOSIS — R918 Other nonspecific abnormal finding of lung field: Secondary | ICD-10-CM | POA: Diagnosis not present

## 2017-12-20 DIAGNOSIS — I700 Atherosclerosis of aorta: Secondary | ICD-10-CM | POA: Diagnosis not present

## 2017-12-20 DIAGNOSIS — D485 Neoplasm of uncertain behavior of skin: Secondary | ICD-10-CM | POA: Diagnosis not present

## 2017-12-20 DIAGNOSIS — Z23 Encounter for immunization: Secondary | ICD-10-CM | POA: Diagnosis not present

## 2017-12-20 DIAGNOSIS — L821 Other seborrheic keratosis: Secondary | ICD-10-CM | POA: Diagnosis not present

## 2017-12-20 DIAGNOSIS — C44519 Basal cell carcinoma of skin of other part of trunk: Secondary | ICD-10-CM | POA: Diagnosis not present

## 2017-12-20 DIAGNOSIS — N99114 Postprocedural urethral stricture, male, unspecified: Secondary | ICD-10-CM | POA: Diagnosis not present

## 2017-12-20 DIAGNOSIS — L814 Other melanin hyperpigmentation: Secondary | ICD-10-CM | POA: Diagnosis not present

## 2017-12-20 DIAGNOSIS — Z7189 Other specified counseling: Secondary | ICD-10-CM | POA: Diagnosis not present

## 2017-12-20 DIAGNOSIS — N35919 Unspecified urethral stricture, male, unspecified site: Secondary | ICD-10-CM | POA: Insufficient documentation

## 2017-12-20 DIAGNOSIS — I10 Essential (primary) hypertension: Secondary | ICD-10-CM | POA: Diagnosis not present

## 2017-12-20 DIAGNOSIS — L570 Actinic keratosis: Secondary | ICD-10-CM | POA: Diagnosis not present

## 2017-12-20 DIAGNOSIS — D225 Melanocytic nevi of trunk: Secondary | ICD-10-CM | POA: Diagnosis not present

## 2017-12-20 DIAGNOSIS — C44722 Squamous cell carcinoma of skin of right lower limb, including hip: Secondary | ICD-10-CM | POA: Diagnosis not present

## 2017-12-20 DIAGNOSIS — Z85828 Personal history of other malignant neoplasm of skin: Secondary | ICD-10-CM | POA: Diagnosis not present

## 2017-12-20 NOTE — Progress Notes (Signed)
Location:  Occupational psychologist of Service:  Clinic (12)  Provider: Kirstan Fentress L. Mariea Clonts, D.O., C.M.D.  Code Status:DNR, MOST done today Goals of Care:  Advanced Directives 08/08/2017  Does Patient Have a Medical Advance Directive? Yes  Type of Paramedic of [place];Out of facility DNR (pink MOST or yellow form)  Does patient want to make changes to medical advance directive? No - Patient declined  Copy of [place] in Chart? Yes  Would patient like information on creating a medical advance directive? -  Pre-existing out of facility DNR order (yellow form or pink MOST form) Yellow form placed in chart (order not valid for inpatient use)     Chief Complaint  Patient presents with  . advance care planning    advanced care planning    HPI: Patient is a 84 y.o. male seen today for medical management of chronic diseases.     Past Medical History:  Diagnosis Date  . Aneurysm of left renal artery (HCC)    per CT 08-20-2015 stable 70mm  . 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 Bladder neck contracture   . BPH (benign prostatic hyperplasia)   . CKD (chronic kidney disease), stage III ([place])   . Complication of anesthesia    post op acute urinary retention  . DDD (degenerative disc disease)   . Foley catheter in place    placed 02-29-2019 secondary to urinary retention post surgery  . Full dentures   . GERD (gastroesophageal reflux disease)   . History of external beam radiation therapy 2002   prostate cancer  and boost with radioative prostate seed implants  . History of prostate cancer DX  2002   S/P EXTERNAL RADIATION/ RADIOACTIVE SEED IMPLANTS  2003  . History of squamous cell carcinoma excision    2012--  RIGHT LOWER EXTREM;  2019 LEFT LOWER EXTREMITIY  . HTN (hypertension)   . Hyperlipemia   . Hypothyroidism    endocrinologist-  dr  Chalmers Cater--- per pt takes his own thyroid med. called NP Thyroid 10 mg daily, does not take synthroid  . Large vessel vasculitis (HCC)    aortitis--- rheumotologist-  dr Charlestine Night  . Left renal artery stenosis (HCC)    proximal per CT 08-20-2015 but patent  . Left renal atrophy   . Macular degeneration   . Nocturia   . OSA on CPAP    pt retested 11-13-2016 at [facility] w/ dr ather-- moderate to severe OSA , AHI 16.4/h/  03-24-2017 per pt is consistant using every night  . Pulmonary nodule    per CT 02-28-2017 in [place] in epic done at [place],  LUL nodule , not mets  . Renal cyst, right   . Restless legs syndrome (RLS) 10/31/2011  . S/P radiation therapy     for tonsillar cancer (head and neck) completed 01-26-2012 at [facility])  . Saliva decreased   . Sarcoma of lower extremity, right ([place]) oncolgoist-  dr Juluis Rainier ([place])   dx 02-14-2017 w/ needle core bx;  high grade pleomorphic spindle cell sarcoma , grade 2 (cT2N0M0); 03-15-2017  radical resection sarcoma tumor right lower leg  and plan radiation  . Self-catheterizes urinary bladder    QID   AND   PRN  . 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 Urethral stricture    chronic---  post urethral dilation's  . Urinary retention with incomplete bladder emptying   . Vitamin B 12 deficiency 01/28/2011  . Vitamin D deficiency     Past Surgical History:  Procedure Laterality Date  . BALLOON DILATION N/A 11/10/2014   Procedure: CYSTO BALLOON DILATION AND RETROGRADE URETHROGRAM ;  Surgeon: Bjorn Loser, MD;  Location: [facility];  Service: Urology;  Laterality: N/A;  . CATARACT EXTRACTION W/ INTRAOCULAR LENS  IMPLANT, BILATERAL    . CYSTO/  BALLOON DILATION OF  URETHRAL STRICTURE  12-25-2010  . CYSTOSCOPY WITH RETROGRADE URETHROGRAM N/A 03/30/2016   Procedure: CYSTOSCOPY WITH RETROGRADE URETHROGRAM AND BALLOON DILATION with cystogram;  Surgeon: Bjorn Loser, MD;  Location: [facility];  Service: Urology;  Laterality: N/A;  . CYSTOSCOPY WITH URETHRAL DILATATION  05/31/2011   Procedure: CYSTOSCOPY WITH URETHRAL DILATATION;  Surgeon: Reece Packer, MD;  Location: [facility];  Service: Urology;  Laterality: N/A;  BALLOON DILATION  . CYSTOSCOPY WITH URETHRAL DILATATION N/A 09/13/2016   Procedure: CYSTOSCOPY WITH URETHRAL BALLOON DILATATION;  Surgeon: Bjorn Loser, MD;  Location: [facility];  Service: Urology;  Laterality: N/A;  . CYSTOSCOPY WITH URETHRAL DILATATION N/A 03/30/2017   Procedure: CYSTOSCOPY WITH BALLOON URETHRAL DILATATION;  Surgeon: Bjorn Loser, MD;  Location: [facility];  Service: Urology;  Laterality: N/A;  . CYSTOSCOPY/RETROGRADE/URETEROSCOPY N/A 05/22/2012   Procedure: CYSTOSCOPY BALLOON DILATION RETROGRADE URETEROGRAM ;  Surgeon: Reece Packer, MD;  Location: [facility];  Service: Urology;  Laterality: N/A;  . CYTSO/  DILATATION URETHRAL STRICTURE/  BX PROSTATIC URETHRA/  REMOVAL FOREIGN BODIES  06-25-2010   [facility]  . INGUINAL HERNIA REPAIR Right 2000  . MOHS SURGERY  01/ 2019    [facility]   left lower leg for SCC  . RADICAL RESECTION OF SARCOMA TUMOR   03-15-2017   [facility]   RIGHT LOWER LEG , CALF AREA  . RADIOACTIVE SEED IMPLANTS, PROSTATE  JAN  2003  . SKIN LESION EXCISION  07/2012   [facility]  right shoulder    Allergies  Allergen Reactions  . Contrast Media [Iodinated Diagnostic Agents] Other (See Comments)    Avoid due to renal disease  . Nitrofurantoin Swelling  . Nsaids Other (See Comments)    Has been told no NSAIDs, Ibuprofen Renal insufficiency hx.   . Cephalosporins Rash  . Suprax [Cefixime] Rash    Outpatient  Encounter Medications as of 12/20/2017  Medication Sig  . buPROPion (WELLBUTRIN) 100 MG tablet TAKE 1 TABLET BY MOUTH TWICE DAILY  . cholecalciferol (VITAMIN D) 1000 units tablet Take 2,000 Units by mouth daily.  . Coenzyme Q10 (COQ10) 100 MG CAPS Take 100 mg by mouth daily. Take one daily  . Cyanocobalamin-Methylcobalamin 600-600 MCG SUBL Place under the tongue daily.  Marland Kitchen DIGESTIVE ENZYMES PO Take 500 mg by mouth every morning.   . fish oil-omega-3 fatty acids 1000 MG capsule Take 2 g by mouth daily. Name:  Natural Factors Omega-3  . lisinopril (PRINIVIL,ZESTRIL) 10 MG tablet Take 10 mg by mouth daily.  . Multiple Vitamins-Minerals (ICAPS AREDS 2 PO) Take 2 tablets by mouth daily.  . NP THYROID 90 MG tablet Take 90 mg by mouth daily.  . predniSONE (DELTASONE) 5 MG tablet Take 2.5 mg by mouth  daily with breakfast.   . RESVERATROL PO Take 100 mg by mouth 2 (two) times daily.   . TURMERIC CURCUMIN PO Take 2 tablets by mouth daily.   Marland Kitchen UNABLE TO FIND [place] Gems take once daily  . UNABLE TO FIND [place] Bone take once daily  . UNABLE TO FIND [place] take 4 tablets once daily  . UNABLE TO FIND Comprehensive immune support take 2 tablets once daily  . UNABLE TO FIND Modified citrus pectin take 4 tablets once daily  . UNABLE TO FIND Celery seed take 2 tablets by mouth once daily  . UNABLE TO FIND Cataplex B take 4 tablets once daily  . UNABLE TO FIND [place] plus take 4 tablets by mouth once daily  . UNABLE TO FIND Cataplex C take 4 tablets once daily  . UNABLE TO FIND Cataplex E take 4 tablets once daily  . UNABLE TO FIND Arginex take 4 tablets once daily  . UNABLE TO FIND Min-Tran take 4 tablet once daily  . UNABLE TO FIND Immuplex take 4 tablets once daily   No facility-administered encounter medications on file as of 12/20/2017.     Review of Systems:  Review of Systems  Constitutional: Negative for chills, fever and malaise/fatigue.  HENT: Negative for congestion.   Eyes:  Negative for blurred vision.  Respiratory: Negative for shortness of breath.   Cardiovascular: Negative for chest pain.  Gastrointestinal: Negative for abdominal pain.  Genitourinary: Negative for dysuria, frequency and urgency.  Musculoskeletal: Negative for falls and joint pain.  Skin: Negative for itching and rash.  Neurological: Negative for dizziness and loss of consciousness.  Psychiatric/Behavioral:       Increased word-finding difficulty    Health Maintenance  Topic Date Due  . TETANUS/TDAP  01/17/2013  . INFLUENZA VACCINE  08/17/2017  . PNA vac Low Risk Adult  Completed    Physical Exam: Vitals:   12/20/17 1001  BP: (!) 148/80  Pulse: 60  Temp: 97.8 F (36.6 C)  TempSrc: Oral  SpO2: 96%  Weight: 159 lb (72.1 kg)  Height: 5\' 11"  (1.803 m)   Body mass index is 22.18 kg/m. Physical Exam  Constitutional: He is oriented to person, place, and time. He appears well-developed and well-nourished. No distress.  HENT:  Head: Normocephalic and atraumatic.  Eyes:  glasses  Pulmonary/Chest: Effort normal.  Musculoskeletal: Normal range of motion.  Neurological: He is alert and oriented to person, place, and time.  Psychiatric: He has a normal mood and affect.    Labs reviewed: Basic Metabolic Panel: Recent Labs    02/07/17 1441 03/22/17 03/30/17 0616 08/24/17 2136 10/24/17 0800  NA 143 142 144 137 143  K 4.8 4.8 3.9 4.1 4.8  CL 102  --   --  100  --   CO2 26  --   --  25  --   GLUCOSE 89  --  94 120*  --   BUN 23 21  --  24* 32*  CREATININE 1.50* 1.5*  --  1.75* 1.5*  CALCIUM 9.1  --   --  8.9  --   TSH  --  2.65  --   --  5.89   Liver Function Tests: Recent Labs    03/22/17 08/24/17 2136 10/24/17 0800  AST 18 20 14   ALT 20 17 13   ALKPHOS 65 51 63  BILITOT  --  1.2  --   PROT  --  6.7  --   ALBUMIN  --  3.5  --    No results for input(s): LIPASE, AMYLASE in the last 8760 hours. No results for input(s): AMMONIA in the last 8760  hours. CBC: Recent Labs    03/22/17 03/30/17 0616 08/24/17 2136 10/24/17 0800  WBC 6.4  --  13.4* 7.1  NEUTROABS  --   --  11.2*  --   HGB 13.2* 11.9* 14.3 13.5  HCT 40* 35.0* 42.7 39*  MCV  --   --  97.5  --   PLT 297  --  177 234   Lipid Panel: No results for input(s): CHOL, HDL, LDLCALC, TRIG, CHOLHDL, LDLDIRECT in the last 8760 hours. No results found for: HGBA1C  Procedures since last visit: No results found.  Assessment/Plan 1. ACP (advance care planning) - Last ACP Note 09/21/2017 to 12/20/2017       ACP (Advance Care Planning) by Gayland Curry, DO at 12/20/2017 10:00 AM    Date of Service   Author Author Type Status Note Type File Time  12/20/2017 Addend Gayland Curry, DO Physician Addendum ACP (Advance Care Planning) 12/20/2017             Two nodules were found on his CT lungs at [facility].  He has a 3 month check up with them.  He's been treated for sarcoma of his right leg.  He's previously had skin cancers as well as prostate cancer and most recently, the sarcoma of his right leg for which he had excision and radiation.  He also reports today that he is now seeing Dr. Jeffie Pollock as his urologist in place of Dr. Matilde Sprang.  He will be having a cystoscopy soon.  He did not have a MOST form.  He does have a DNR form but an updated form was completed by me today.  We discussed the MOST today and completed it as below.  A copy of it and the new DNR form will be scanned in documents.  He and his wife have been part of the education groups here about advance directives.  He now understands what cardiopulmonary arrest means.  He requests DNR status, limited additional interventions where he could go to the hospital but not ICU where he'd be put on machines.  He would like antibiotics if he has any kind of infection and short term IVFs.  He is clear he does not want feeding tubes.  This was reviewed today with Sharon and his wife, Angelita Ingles.  They will also discuss this with their daughter  Marcene Brawn who lives closest.    30 mins were spent on ACP.                   2. Pulmonary nodules/lesions, multiple -2 noted on CT at [facility], has regular 3 month f/u with oncology  3. Postprocedural male urethral stricture -chronic issue, has to I/O cath, now seeing Dr. Jeffie Pollock who is going to perform a cystoscopy  4. Essential hypertension -bp above goal, pt only takes his ace every few days against medical advice  Labs/tests ordered:  No orders of the defined types were placed in this encounter.  Next appt:  05/02/2018  Nashonda Limberg L. Kassius Battiste, D.O. [facility] [address], [place] [address] Cell Phone (Mon-Fri 8am-5pm):  [phone] On Call:  [phone] & follow prompts after 5pm & weekends Office Phone:  [phone] Office Fax:  [phone]

## 2017-12-20 NOTE — ACP (Advance Care Planning) (Addendum)
Two nodules were found on his CT lungs at [facility].  He has a 3 month check up with them.  He's been treated for sarcoma of his right leg.  He's previously had skin cancers as well as prostate cancer and most recently, the sarcoma of his right leg for which he had excision and radiation.  He also reports today that he is now seeing Dr. Jeffie Pollock as his urologist in place of Dr. Matilde Sprang.  He will be having a cystoscopy soon.  He did not have a MOST form.  He does have a DNR form but an updated form was completed by me today.  We discussed the MOST today and completed it as below.  A copy of it and the new DNR form will be scanned in documents.  He and his wife have been part of the education groups here about advance directives.  He now understands what cardiopulmonary arrest means.  He requests DNR status, limited additional interventions where he could go to the hospital but not ICU where he'd be put on machines.  He would like antibiotics if he has any kind of infection and short term IVFs.  He is clear he does not want feeding tubes.  This was reviewed today with Philip [name] and his wife, Philip [name].  They will also discuss this with their daughter Philip [name] who lives closest.    30 mins were spent on ACP.

## 2017-12-25 DIAGNOSIS — N35012 Post-traumatic membranous urethral stricture: Secondary | ICD-10-CM | POA: Diagnosis not present

## 2017-12-25 DIAGNOSIS — N3020 Other chronic cystitis without hematuria: Secondary | ICD-10-CM | POA: Diagnosis not present

## 2018-02-22 DIAGNOSIS — L905 Scar conditions and fibrosis of skin: Secondary | ICD-10-CM | POA: Diagnosis not present

## 2018-02-22 DIAGNOSIS — C44722 Squamous cell carcinoma of skin of right lower limb, including hip: Secondary | ICD-10-CM | POA: Diagnosis not present

## 2018-02-27 DIAGNOSIS — Z923 Personal history of irradiation: Secondary | ICD-10-CM | POA: Diagnosis not present

## 2018-02-27 DIAGNOSIS — Z08 Encounter for follow-up examination after completed treatment for malignant neoplasm: Secondary | ICD-10-CM | POA: Diagnosis not present

## 2018-02-27 DIAGNOSIS — R5383 Other fatigue: Secondary | ICD-10-CM | POA: Diagnosis not present

## 2018-02-27 DIAGNOSIS — Z7952 Long term (current) use of systemic steroids: Secondary | ICD-10-CM | POA: Diagnosis not present

## 2018-02-27 DIAGNOSIS — M8580 Other specified disorders of bone density and structure, unspecified site: Secondary | ICD-10-CM | POA: Diagnosis not present

## 2018-02-27 DIAGNOSIS — K117 Disturbances of salivary secretion: Secondary | ICD-10-CM | POA: Diagnosis not present

## 2018-02-27 DIAGNOSIS — Z9221 Personal history of antineoplastic chemotherapy: Secondary | ICD-10-CM | POA: Diagnosis not present

## 2018-02-27 DIAGNOSIS — E039 Hypothyroidism, unspecified: Secondary | ICD-10-CM | POA: Diagnosis not present

## 2018-02-27 DIAGNOSIS — Z85818 Personal history of malignant neoplasm of other sites of lip, oral cavity, and pharynx: Secondary | ICD-10-CM | POA: Diagnosis not present

## 2018-02-27 DIAGNOSIS — Z8589 Personal history of malignant neoplasm of other organs and systems: Secondary | ICD-10-CM | POA: Diagnosis not present

## 2018-02-27 DIAGNOSIS — I776 Arteritis, unspecified: Secondary | ICD-10-CM | POA: Diagnosis not present

## 2018-02-27 DIAGNOSIS — M79606 Pain in leg, unspecified: Secondary | ICD-10-CM | POA: Diagnosis not present

## 2018-02-27 DIAGNOSIS — N189 Chronic kidney disease, unspecified: Secondary | ICD-10-CM | POA: Diagnosis not present

## 2018-03-06 DIAGNOSIS — I712 Thoracic aortic aneurysm, without rupture: Secondary | ICD-10-CM | POA: Diagnosis not present

## 2018-03-06 DIAGNOSIS — C4921 Malignant neoplasm of connective and soft tissue of right lower limb, including hip: Secondary | ICD-10-CM | POA: Diagnosis not present

## 2018-03-06 DIAGNOSIS — Z79899 Other long term (current) drug therapy: Secondary | ICD-10-CM | POA: Diagnosis not present

## 2018-03-06 DIAGNOSIS — Z923 Personal history of irradiation: Secondary | ICD-10-CM | POA: Diagnosis not present

## 2018-03-06 DIAGNOSIS — R918 Other nonspecific abnormal finding of lung field: Secondary | ICD-10-CM | POA: Diagnosis not present

## 2018-03-11 ENCOUNTER — Other Ambulatory Visit: Payer: Self-pay | Admitting: Internal Medicine

## 2018-03-12 ENCOUNTER — Telehealth: Payer: Self-pay

## 2018-03-12 NOTE — Telephone Encounter (Signed)
I called pt, he has an appointment tomorrow with Dr. Rexene Alberts but has not been using his cpap since June of 2019. He reports that he has not been using cpap but does plan on starting it back soon and asked that I cancel the appt with Dr. Rexene Alberts tomorrow. He will call me back when he is ready to reschedule.

## 2018-03-13 ENCOUNTER — Ambulatory Visit: Payer: Medicare Other | Admitting: Neurology

## 2018-04-18 DIAGNOSIS — I129 Hypertensive chronic kidney disease with stage 1 through stage 4 chronic kidney disease, or unspecified chronic kidney disease: Secondary | ICD-10-CM | POA: Diagnosis not present

## 2018-04-18 DIAGNOSIS — C499 Malignant neoplasm of connective and soft tissue, unspecified: Secondary | ICD-10-CM | POA: Diagnosis not present

## 2018-04-18 DIAGNOSIS — I701 Atherosclerosis of renal artery: Secondary | ICD-10-CM | POA: Diagnosis not present

## 2018-04-18 DIAGNOSIS — Z8546 Personal history of malignant neoplasm of prostate: Secondary | ICD-10-CM | POA: Diagnosis not present

## 2018-04-18 DIAGNOSIS — N183 Chronic kidney disease, stage 3 (moderate): Secondary | ICD-10-CM | POA: Diagnosis not present

## 2018-04-18 DIAGNOSIS — Z85818 Personal history of malignant neoplasm of other sites of lip, oral cavity, and pharynx: Secondary | ICD-10-CM | POA: Diagnosis not present

## 2018-04-18 DIAGNOSIS — M316 Other giant cell arteritis: Secondary | ICD-10-CM | POA: Diagnosis not present

## 2018-04-18 DIAGNOSIS — N320 Bladder-neck obstruction: Secondary | ICD-10-CM | POA: Diagnosis not present

## 2018-05-02 ENCOUNTER — Other Ambulatory Visit: Payer: Self-pay

## 2018-05-02 ENCOUNTER — Non-Acute Institutional Stay: Payer: Medicare Other | Admitting: Internal Medicine

## 2018-05-02 ENCOUNTER — Encounter: Payer: Self-pay | Admitting: Internal Medicine

## 2018-05-02 VITALS — BP 120/78 | HR 63 | Temp 97.8°F | Ht 71.0 in | Wt 161.0 lb

## 2018-05-02 DIAGNOSIS — I10 Essential (primary) hypertension: Secondary | ICD-10-CM

## 2018-05-02 DIAGNOSIS — N183 Chronic kidney disease, stage 3 unspecified: Secondary | ICD-10-CM

## 2018-05-02 DIAGNOSIS — I712 Thoracic aortic aneurysm, without rupture, unspecified: Secondary | ICD-10-CM

## 2018-05-02 DIAGNOSIS — Z85831 Personal history of malignant neoplasm of soft tissue: Secondary | ICD-10-CM

## 2018-05-02 DIAGNOSIS — N99114 Postprocedural urethral stricture, male, unspecified: Secondary | ICD-10-CM

## 2018-05-02 DIAGNOSIS — F325 Major depressive disorder, single episode, in full remission: Secondary | ICD-10-CM | POA: Diagnosis not present

## 2018-05-02 DIAGNOSIS — R918 Other nonspecific abnormal finding of lung field: Secondary | ICD-10-CM

## 2018-05-02 DIAGNOSIS — E034 Atrophy of thyroid (acquired): Secondary | ICD-10-CM | POA: Diagnosis not present

## 2018-05-02 DIAGNOSIS — I700 Atherosclerosis of aorta: Secondary | ICD-10-CM

## 2018-05-02 DIAGNOSIS — M316 Other giant cell arteritis: Secondary | ICD-10-CM

## 2018-05-02 NOTE — Progress Notes (Signed)
Location:  [facility], manufacturing systems of Service:  Clinic (12)clinic  Provider: Mercer Peifer L. Mariea Clonts, D.O., C.M.D.  Code Status: DNR, MOST Goals of Care:  Advanced Directives 05/02/2018  Does Patient Have a Medical Advance Directive? Yes  Type of Advance Directive Out of facility DNR (pink MOST or yellow form)  Does patient want to make changes to medical advance directive? No - Patient declined  Copy of [place] in Chart? -  Would patient like information on creating a medical advance directive? -  Pre-existing out of facility DNR order (yellow form or pink MOST form) Yellow form placed in chart (order not valid for inpatient use);Pink MOST form placed in chart (order not valid for inpatient use)     Chief Complaint  Patient presents with  . Medical Management of Chronic Issues    37mth follow-up    HPI: Patient is a 85 y.o. male seen today for medical management of chronic diseases.    He's getting back into gardening after a few years of doing nothing,  He's enjoying that.    Last saw Dr. Cherre Blanc in Feb.  He is due again in May for his 3 month imaging but that may not happen due to covid-19.  Having CT alternating with xray.    Needs TSH recehcked due to high normal value 6 mos ago.  He has been taking his blood pressure medication and his BP was great this morning.  He is seeing Dr. Jeffie Pollock with urology now.  Occasionally [facility] cannot get his I/O catheter in.  Since he had a foley when at [facility], he has been on azo and has not had problems with doing his catheterizations.  He went to [place] 04/18/18.  He was to check his bp twice a day for two weeks and call them with results.  He says his bps daily have been 140/90 or lower.    Still mild weakness of his right leg--does one-legged squats.    No abdominal pains.  Aortitis not recently flared.  Had some pain in his TMJ that would come and go for a couple of weeks, then resolved.   His wife notices he touches his jaw.  He says he gets some pain in his neck along the sides--both occasionally.  None for a few days.  Does not recall doing anything that would bring it on.         Past Medical History:  Diagnosis Date  . Aneurysm of left renal artery (HCC)    per CT 08-20-2015 stable 73mm  . 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 Bladder neck contracture   . BPH (benign prostatic hyperplasia)   . CKD (chronic kidney disease), stage III ([place])   . Complication of anesthesia    post op acute urinary retention  . DDD (degenerative disc disease)   . Foley catheter in place    placed 02-29-2019 secondary to urinary retention post surgery  . Full dentures   . GERD (gastroesophageal reflux disease)   . History of external beam radiation therapy 2002   prostate cancer  and boost with radioative prostate seed implants  . History of prostate cancer DX  2002   S/P EXTERNAL RADIATION/ RADIOACTIVE SEED IMPLANTS  2003  . History of squamous cell carcinoma excision    2012--  RIGHT LOWER EXTREM;  2019 LEFT LOWER EXTREMITIY  . HTN (hypertension)   . Hyperlipemia   .  Hypothyroidism    endocrinologist-  dr Chalmers Cater--- per pt takes his own thyroid med. called NP Thyroid 10 mg daily, does not take synthroid  . Large vessel vasculitis (HCC)    aortitis--- rheumotologist-  dr Charlestine Night  . Left renal artery stenosis (HCC)    proximal per CT 08-20-2015 but patent  . Left renal atrophy   . Macular degeneration   . Nocturia   . OSA on CPAP    pt retested 11-13-2016 at [facility] w/ dr ather-- moderate to severe OSA , AHI 16.4/h/  03-24-2017 per pt is consistant using every night  . Pulmonary nodule    per CT 02-28-2017 in [place] in epic done at [place],  LUL nodule , not mets  . Renal cyst, right   . Restless legs syndrome (RLS) 10/31/2011  . S/P radiation therapy     for tonsillar cancer (head and  neck) completed 01-26-2012 at [facility])  . Saliva decreased   . Sarcoma of lower extremity, right ([place]) oncolgoist-  dr Juluis Rainier ([place])   dx 02-14-2017 w/ needle core bx;  high grade pleomorphic spindle cell sarcoma , grade 2 (cT2N0M0); 03-15-2017  radical resection sarcoma tumor right lower leg  and plan radiation  . Self-catheterizes urinary bladder    QID   AND   PRN  . 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 Urethral stricture    chronic---  post urethral dilation's  . Urinary retention with incomplete bladder emptying   . Vitamin B 12 deficiency 01/28/2011  . Vitamin D deficiency     Past Surgical History:  Procedure Laterality Date  . BALLOON DILATION N/A 11/10/2014   Procedure: CYSTO BALLOON DILATION AND RETROGRADE URETHROGRAM ;  Surgeon: Bjorn Loser, MD;  Location[facility];  Service: Urology;  Laterality: N/A;  . CATARACT EXTRACTION W/ INTRAOCULAR LENS  IMPLANT, BILATERAL    . CYSTO/ BALLOON DILATION OF  URETHRAL STRICTURE  12-25-2010  . CYSTOSCOPY WITH RETROGRADE URETHROGRAM N/A 03/30/2016   Procedure: CYSTOSCOPY WITH RETROGRADE URETHROGRAM AND BALLOON DILATION with cystogram;  Surgeon: Bjorn Loser, MD;  Location: [facility];  Service: Urology;  Laterality: N/A;  . CYSTOSCOPY WITH URETHRAL DILATATION  05/31/2011   Procedure: CYSTOSCOPY WITH URETHRAL DILATATION;  Surgeon: Reece Packer, MD;  Location: [place];  Service: Urology;  Laterality: N/A;  BALLOON DILATION  . CYSTOSCOPY WITH URETHRAL DILATATION N/A 09/13/2016   Procedure: CYSTOSCOPY WITH URETHRAL BALLOON DILATATION;  Surgeon: Bjorn Loser, MD;  Location: [place];   Service: Urology;  Laterality: N/A;  . CYSTOSCOPY WITH URETHRAL DILATATION N/A 03/30/2017   Procedure: CYSTOSCOPY WITH BALLOON URETHRAL DILATATION;  Surgeon: Bjorn Loser, MD;  Location: [place];  Service: Urology;  Laterality: N/A;  . CYSTOSCOPY/RETROGRADE/URETEROSCOPY N/A 05/22/2012   Procedure: CYSTOSCOPY BALLOON DILATION RETROGRADE URETEROGRAM ;  Surgeon: Reece Packer, MD;  Location: [place];  Service: Urology;  Laterality: N/A;  . CYTSO/  DILATATION URETHRAL STRICTURE/  BX PROSTATIC URETHRA/  REMOVAL FOREIGN BODIES  06-25-2010   [facility]  . INGUINAL HERNIA REPAIR Right 2000  . MOHS SURGERY  01/ 2019    [facility]   left lower leg for SCC  .  RADICAL RESECTION OF SARCOMA TUMOR   03-15-2017   [facility]   RIGHT LOWER LEG , CALF AREA  . RADIOACTIVE SEED IMPLANTS, PROSTATE  JAN  2003  . SKIN LESION EXCISION  07/2012   MOST  right shoulder    Allergies  Allergen Reactions  . Contrast Media [Iodinated Diagnostic Agents] Other (See Comments)    Avoid due to renal disease  . Nitrofurantoin Swelling  . Nsaids Other (See Comments)    Has been told no NSAIDs, Ibuprofen Renal insufficiency hx.   . Cephalosporins Rash  . Suprax [Cefixime] Rash    Outpatient Encounter Medications as of 05/02/2018  Medication Sig  . buPROPion (WELLBUTRIN) 100 MG tablet TAKE 1 TABLET BY MOUTH TWICE DAILY  . cholecalciferol (VITAMIN D) 1000 units tablet Take 2,000 Units by mouth daily.  . Coenzyme Q10 (COQ10) 100 MG CAPS Take 100 mg by mouth daily. Take one daily  . Cyanocobalamin-Methylcobalamin 600-600 MCG SUBL Place under the tongue daily.  Marland Kitchen DIGESTIVE ENZYMES PO Take 500 mg by mouth every morning.   . fish oil-omega-3 fatty acids 1000 MG capsule Take 2 g by mouth daily. Name:  Natural Factors Omega-3  . lisinopril (PRINIVIL,ZESTRIL) 10 MG tablet Take 10 mg by mouth daily.  . Multiple Vitamins-Minerals (ICAPS AREDS 2 PO) Take 2 tablets by mouth daily.  . NP THYROID 90 MG tablet  Take 90 mg by mouth daily.  . predniSONE (DELTASONE) 5 MG tablet Take 2.5 mg by mouth daily with breakfast.   . RESVERATROL PO Take 100 mg by mouth 2 (two) times daily.   . TURMERIC CURCUMIN PO Take 2 tablets by mouth daily.   Marland Kitchen UNABLE TO FIND [place] Cage Gems take once daily  . UNABLE TO FIND [place] Bone take once daily  . UNABLE TO FIND [place] take 4 tablets once daily  . UNABLE TO FIND Comprehensive immune support take 2 tablets once daily  . UNABLE TO FIND Modified citrus pectin take 4 tablets once daily  . UNABLE TO FIND Celery seed take 2 tablets by mouth once daily  . UNABLE TO FIND Cataplex B take 4 tablets once daily  . UNABLE TO FIND [place] plus take 4 tablets by mouth once daily  . UNABLE TO FIND Cataplex C take 4 tablets once daily  . UNABLE TO FIND Cataplex E take 4 tablets once daily  . UNABLE TO FIND Arginex take 4 tablets once daily  . UNABLE TO FIND Min-Tran take 4 tablet once daily  . UNABLE TO FIND Immuplex take 4 tablets once daily   No facility-administered encounter medications on file as of 05/02/2018.     Review of Systems:  Review of Systems  Constitutional: Negative for chills, fever and malaise/fatigue.  HENT: Positive for hearing loss. Negative for congestion.   Eyes: Negative for blurred vision.  Respiratory: Negative for cough and shortness of breath.   Cardiovascular: Negative for chest pain, palpitations and leg swelling.  Gastrointestinal: Negative for abdominal pain, blood in stool, constipation, diarrhea and melena.  Genitourinary: Negative for dysuria.       No difficulties recently with I/O caths  Musculoskeletal: Positive for myalgias and neck pain. Negative for falls and joint pain.  Skin: Negative for itching.  Neurological: Negative for dizziness, loss of consciousness and headaches.  Endo/Heme/Allergies: Does not bruise/bleed easily.  Psychiatric/Behavioral: Negative for depression and memory loss. The patient is not nervous/anxious and  does not have insomnia.     Health Maintenance  Topic Date Due  .  TETANUS/TDAP  01/17/2013  . INFLUENZA VACCINE  08/18/2018  . PNA vac Low Risk Adult  Completed    Physical Exam: Vitals:   05/02/18 0941  BP: 120/78  Pulse: 63  Temp: 97.8 F (36.6 C)  TempSrc: Oral  SpO2: 94%  Weight: 161 lb (73 kg)  Height: 5\' 11"  (1.803 m)   Body mass index is 22.45 kg/m. Physical Exam Vitals signs reviewed.  Constitutional:      General: He is not in acute distress.    Appearance: Normal appearance. He is normal weight. He is not toxic-appearing.  HENT:     Head: Normocephalic and atraumatic.  Neck:     Musculoskeletal: No neck rigidity or muscular tenderness.     Comments: Slight decreased rotation left and some pulling sensation in muscles during that motion; no tenderness on exam, normal flexion, extension limited Cardiovascular:     Rate and Rhythm: Normal rate and regular rhythm.     Pulses: Normal pulses.     Heart sounds: Normal heart sounds.  Pulmonary:     Effort: Pulmonary effort is normal.     Breath sounds: Normal breath sounds.  Abdominal:     General: Bowel sounds are normal.  Musculoskeletal: Normal range of motion.     Comments: Except neck (see neck)  Lymphadenopathy:     Cervical: No cervical adenopathy.  Skin:    General: Skin is warm and dry.  Neurological:     General: No focal deficit present.     Mental Status: He is alert and oriented to person, place, and time.  Psychiatric:        Mood and Affect: Mood normal.     Labs reviewed: Basic Metabolic Panel: Recent Labs    08/24/17 2136 10/24/17 0800  NA 137 143  K 4.1 4.8  CL 100  --   CO2 25  --   GLUCOSE 120*  --   BUN 24* 32*  CREATININE 1.75* 1.5*  CALCIUM 8.9  --   TSH  --  5.89   Liver Function Tests: Recent Labs    08/24/17 2136 10/24/17 0800  AST 20 14  ALT 17 13  ALKPHOS 51 63  BILITOT 1.2  --   PROT 6.7  --   ALBUMIN 3.5  --    No results for input(s): LIPASE, AMYLASE  in the last 8760 hours. No results for input(s): AMMONIA in the last 8760 hours. CBC: Recent Labs    08/24/17 2136 10/24/17 0800  WBC 13.4* 7.1  NEUTROABS 11.2*  --   HGB 14.3 13.5  HCT 42.7 39*  MCV 97.5  --   PLT 177 234   Lipid Panel: No results for input(s): CHOL, HDL, LDLCALC, TRIG, CHOLHDL, LDLDIRECT in the last 8760 hours. No results found for: HGBA1C  Procedures since last visit: CT chest w/o contrast with 3D MIPS protocol at [facility]:  1. Stable scattered bilateral pulmonary nodules. No new or increasing nodules. 2. Unchanged aneurysmal dilation of the ascending thoracic aorta measuring up to 4.6 cm in diameter.  Assessment/Plan 1. Large vessel vasculitis (HCC) -no recent flare-ups, remains on low dose prednisone at breakfast -cont to monitor, followed by rheumatology  2. History of sarcoma -followed closely at [facility] for this and getting serial CTs and xrays of chest due to pulmonary nodules, but they've been stable and no local signs or symptoms of recurrence  3. Major depressive disorder with single episode, in full remission ([place]) -not c/o this at this point, spirits seem  improved vs last visit even with social isolation at this time; continues home stretching, walking  4. Atherosclerosis of aorta ([place]) -noted on imaging  -he's not a fan of taking prescription meds--on fish oil, refuses asa, only takes lisinopril when he is coming to appts it seems so his bp will be lower--has difficulty making eye contact with me when we discuss if he's taking his meds  5. Thoracic aortic aneurysm without rupture ([place]) -must control bp to keep this from growing and rupturing, at 4.6 cm on CT earlier this year  6. Essential hypertension -bp excellent this morning--reports having taken his lisinopril -cont it  7. Pulmonary nodules/lesions, multiple -noted on CT chest with work-up of sarcoma--being monitored at [facility] with serial CTs alternating with xrays (at his request to decrease  radiation exposure)  8. Hypothyroidism due to acquired atrophy of thyroid -cont his NP thyroid 90 mg -previously had to adjust this -f/u tsh in am  9. Chronic kidney disease, stage 3 (HCC) -Avoid nephrotoxic agents like nsaids, dose adjust renally excreted meds, hydrate. -be careful with supplements due to potential renal side effects -hold lisinopril if dehydrated due to acute illness  10. Postprocedural male urethral stricture -no recent problems with doing his I/O caths, following now with Dr. Jeffie Pollock  Labs/tests ordered:  tsh and fasting lipid in am (no recent checks of these)  Next appt: 6 mos med mgt  Macallan Ord L. Lavanda Nevels, D.O. [facility] [address], [place] [address] Cell Phone (Mon-Fri 8am-5pm):  [phone] On Call:  [phone] & follow prompts after 5pm & weekends Office Phone:  [phone] Office Fax:  [phone]

## 2018-05-03 DIAGNOSIS — E785 Hyperlipidemia, unspecified: Secondary | ICD-10-CM | POA: Diagnosis not present

## 2018-05-03 DIAGNOSIS — E039 Hypothyroidism, unspecified: Secondary | ICD-10-CM | POA: Diagnosis not present

## 2018-05-03 LAB — LIPID PANEL
Cholesterol: 253 — AB (ref 0–200)
HDL: 79 — AB (ref 35–70)
LDL Cholesterol: 157
Triglycerides: 81 (ref 40–160)

## 2018-05-03 LAB — TSH: TSH: 3.85 (ref 0.41–5.90)

## 2018-05-04 ENCOUNTER — Encounter: Payer: Self-pay | Admitting: Internal Medicine

## 2018-05-04 DIAGNOSIS — I712 Thoracic aortic aneurysm, without rupture, unspecified: Secondary | ICD-10-CM | POA: Insufficient documentation

## 2018-05-04 DIAGNOSIS — F324 Major depressive disorder, single episode, in partial remission: Secondary | ICD-10-CM | POA: Insufficient documentation

## 2018-05-04 DIAGNOSIS — I700 Atherosclerosis of aorta: Secondary | ICD-10-CM | POA: Insufficient documentation

## 2018-05-30 DIAGNOSIS — Z7952 Long term (current) use of systemic steroids: Secondary | ICD-10-CM | POA: Diagnosis not present

## 2018-05-30 DIAGNOSIS — E039 Hypothyroidism, unspecified: Secondary | ICD-10-CM | POA: Diagnosis not present

## 2018-05-30 DIAGNOSIS — M79606 Pain in leg, unspecified: Secondary | ICD-10-CM | POA: Diagnosis not present

## 2018-05-30 DIAGNOSIS — R5383 Other fatigue: Secondary | ICD-10-CM | POA: Diagnosis not present

## 2018-05-30 DIAGNOSIS — N189 Chronic kidney disease, unspecified: Secondary | ICD-10-CM | POA: Diagnosis not present

## 2018-05-30 DIAGNOSIS — M8580 Other specified disorders of bone density and structure, unspecified site: Secondary | ICD-10-CM | POA: Diagnosis not present

## 2018-05-30 DIAGNOSIS — I776 Arteritis, unspecified: Secondary | ICD-10-CM | POA: Diagnosis not present

## 2018-06-05 DIAGNOSIS — C4921 Malignant neoplasm of connective and soft tissue of right lower limb, including hip: Secondary | ICD-10-CM | POA: Diagnosis not present

## 2018-06-05 DIAGNOSIS — R918 Other nonspecific abnormal finding of lung field: Secondary | ICD-10-CM | POA: Diagnosis not present

## 2018-06-19 DIAGNOSIS — N3020 Other chronic cystitis without hematuria: Secondary | ICD-10-CM | POA: Diagnosis not present

## 2018-06-19 DIAGNOSIS — N35012 Post-traumatic membranous urethral stricture: Secondary | ICD-10-CM | POA: Diagnosis not present

## 2018-06-20 DIAGNOSIS — R3121 Asymptomatic microscopic hematuria: Secondary | ICD-10-CM | POA: Diagnosis not present

## 2018-06-20 DIAGNOSIS — N35012 Post-traumatic membranous urethral stricture: Secondary | ICD-10-CM | POA: Diagnosis not present

## 2018-07-16 ENCOUNTER — Telehealth: Payer: Self-pay | Admitting: Internal Medicine

## 2018-07-16 ENCOUNTER — Other Ambulatory Visit: Payer: Self-pay | Admitting: Internal Medicine

## 2018-07-16 NOTE — Telephone Encounter (Signed)
Spoke with pt and he declined appt here at [facility], per pt he will call his optometrist and see if they can see him.

## 2018-07-16 NOTE — Telephone Encounter (Signed)
Patient states that he has a stye on his left eye for about two weeks.  He had wanted to schedule an appointment with Dr. Mariea Clonts for this week.  He was told that Dr. Mariea Clonts was out of the office.  Dr. Mariea Clonts did not have any openings for 07/25/18.  He did not want to come to [facility] to be seen.

## 2018-07-19 DIAGNOSIS — H5310 Unspecified subjective visual disturbances: Secondary | ICD-10-CM | POA: Diagnosis not present

## 2018-08-16 DIAGNOSIS — D485 Neoplasm of uncertain behavior of skin: Secondary | ICD-10-CM | POA: Diagnosis not present

## 2018-08-24 DIAGNOSIS — H0231 Blepharochalasis right upper eyelid: Secondary | ICD-10-CM | POA: Diagnosis not present

## 2018-08-24 DIAGNOSIS — H0232 Blepharochalasis right lower eyelid: Secondary | ICD-10-CM | POA: Diagnosis not present

## 2018-08-24 DIAGNOSIS — H57813 Brow ptosis, bilateral: Secondary | ICD-10-CM | POA: Diagnosis not present

## 2018-08-24 DIAGNOSIS — L570 Actinic keratosis: Secondary | ICD-10-CM | POA: Diagnosis not present

## 2018-08-24 DIAGNOSIS — H0235 Blepharochalasis left lower eyelid: Secondary | ICD-10-CM | POA: Diagnosis not present

## 2018-08-24 DIAGNOSIS — L219 Seborrheic dermatitis, unspecified: Secondary | ICD-10-CM | POA: Diagnosis not present

## 2018-08-24 DIAGNOSIS — D485 Neoplasm of uncertain behavior of skin: Secondary | ICD-10-CM | POA: Diagnosis not present

## 2018-08-24 DIAGNOSIS — H0234 Blepharochalasis left upper eyelid: Secondary | ICD-10-CM | POA: Diagnosis not present

## 2018-08-30 DIAGNOSIS — N189 Chronic kidney disease, unspecified: Secondary | ICD-10-CM | POA: Diagnosis not present

## 2018-08-30 DIAGNOSIS — R5383 Other fatigue: Secondary | ICD-10-CM | POA: Diagnosis not present

## 2018-08-30 DIAGNOSIS — M8580 Other specified disorders of bone density and structure, unspecified site: Secondary | ICD-10-CM | POA: Diagnosis not present

## 2018-08-30 DIAGNOSIS — M62552 Muscle wasting and atrophy, not elsewhere classified, left thigh: Secondary | ICD-10-CM | POA: Diagnosis not present

## 2018-08-30 DIAGNOSIS — Z7952 Long term (current) use of systemic steroids: Secondary | ICD-10-CM | POA: Diagnosis not present

## 2018-08-30 DIAGNOSIS — E039 Hypothyroidism, unspecified: Secondary | ICD-10-CM | POA: Diagnosis not present

## 2018-08-30 DIAGNOSIS — M79606 Pain in leg, unspecified: Secondary | ICD-10-CM | POA: Diagnosis not present

## 2018-08-30 DIAGNOSIS — I776 Arteritis, unspecified: Secondary | ICD-10-CM | POA: Diagnosis not present

## 2018-09-12 DIAGNOSIS — C4921 Malignant neoplasm of connective and soft tissue of right lower limb, including hip: Secondary | ICD-10-CM | POA: Diagnosis not present

## 2018-09-12 DIAGNOSIS — Z8589 Personal history of malignant neoplasm of other organs and systems: Secondary | ICD-10-CM | POA: Diagnosis not present

## 2018-09-12 DIAGNOSIS — Z79899 Other long term (current) drug therapy: Secondary | ICD-10-CM | POA: Diagnosis not present

## 2018-09-12 DIAGNOSIS — Z08 Encounter for follow-up examination after completed treatment for malignant neoplasm: Secondary | ICD-10-CM | POA: Diagnosis not present

## 2018-09-12 DIAGNOSIS — M5384 Other specified dorsopathies, thoracic region: Secondary | ICD-10-CM | POA: Diagnosis not present

## 2018-09-12 DIAGNOSIS — Z9889 Other specified postprocedural states: Secondary | ICD-10-CM | POA: Diagnosis not present

## 2018-09-12 DIAGNOSIS — Z923 Personal history of irradiation: Secondary | ICD-10-CM | POA: Diagnosis not present

## 2018-09-12 DIAGNOSIS — Z85818 Personal history of malignant neoplasm of other sites of lip, oral cavity, and pharynx: Secondary | ICD-10-CM | POA: Diagnosis not present

## 2018-09-12 DIAGNOSIS — E039 Hypothyroidism, unspecified: Secondary | ICD-10-CM | POA: Diagnosis not present

## 2018-09-12 DIAGNOSIS — M6250 Muscle wasting and atrophy, not elsewhere classified, unspecified site: Secondary | ICD-10-CM | POA: Diagnosis not present

## 2018-09-20 ENCOUNTER — Other Ambulatory Visit: Payer: Self-pay | Admitting: Internal Medicine

## 2018-09-25 DIAGNOSIS — C4921 Malignant neoplasm of connective and soft tissue of right lower limb, including hip: Secondary | ICD-10-CM | POA: Diagnosis not present

## 2018-10-08 DIAGNOSIS — E039 Hypothyroidism, unspecified: Secondary | ICD-10-CM | POA: Diagnosis not present

## 2018-10-23 DIAGNOSIS — H0234 Blepharochalasis left upper eyelid: Secondary | ICD-10-CM | POA: Diagnosis not present

## 2018-10-23 DIAGNOSIS — D485 Neoplasm of uncertain behavior of skin: Secondary | ICD-10-CM | POA: Diagnosis not present

## 2018-10-23 DIAGNOSIS — H0231 Blepharochalasis right upper eyelid: Secondary | ICD-10-CM | POA: Diagnosis not present

## 2018-10-23 DIAGNOSIS — L219 Seborrheic dermatitis, unspecified: Secondary | ICD-10-CM | POA: Diagnosis not present

## 2018-10-23 DIAGNOSIS — C441192 Basal cell carcinoma of skin of left lower eyelid, including canthus: Secondary | ICD-10-CM | POA: Diagnosis not present

## 2018-10-23 DIAGNOSIS — H57813 Brow ptosis, bilateral: Secondary | ICD-10-CM | POA: Diagnosis not present

## 2018-10-23 DIAGNOSIS — H0232 Blepharochalasis right lower eyelid: Secondary | ICD-10-CM | POA: Diagnosis not present

## 2018-10-23 DIAGNOSIS — H0235 Blepharochalasis left lower eyelid: Secondary | ICD-10-CM | POA: Diagnosis not present

## 2018-10-23 DIAGNOSIS — L570 Actinic keratosis: Secondary | ICD-10-CM | POA: Diagnosis not present

## 2018-10-30 DIAGNOSIS — L814 Other melanin hyperpigmentation: Secondary | ICD-10-CM | POA: Diagnosis not present

## 2018-10-30 DIAGNOSIS — D225 Melanocytic nevi of trunk: Secondary | ICD-10-CM | POA: Diagnosis not present

## 2018-10-30 DIAGNOSIS — L821 Other seborrheic keratosis: Secondary | ICD-10-CM | POA: Diagnosis not present

## 2018-10-30 DIAGNOSIS — L570 Actinic keratosis: Secondary | ICD-10-CM | POA: Diagnosis not present

## 2018-10-30 DIAGNOSIS — Z85828 Personal history of other malignant neoplasm of skin: Secondary | ICD-10-CM | POA: Diagnosis not present

## 2018-10-30 DIAGNOSIS — D485 Neoplasm of uncertain behavior of skin: Secondary | ICD-10-CM | POA: Diagnosis not present

## 2018-10-30 DIAGNOSIS — D0462 Carcinoma in situ of skin of left upper limb, including shoulder: Secondary | ICD-10-CM | POA: Diagnosis not present

## 2018-10-31 ENCOUNTER — Other Ambulatory Visit: Payer: Self-pay

## 2018-10-31 ENCOUNTER — Non-Acute Institutional Stay: Payer: Medicare Other | Admitting: Internal Medicine

## 2018-10-31 ENCOUNTER — Encounter: Payer: Self-pay | Admitting: Internal Medicine

## 2018-10-31 VITALS — BP 152/90 | HR 67 | Temp 98.3°F | Ht 71.0 in | Wt 164.2 lb

## 2018-10-31 DIAGNOSIS — F331 Major depressive disorder, recurrent, moderate: Secondary | ICD-10-CM

## 2018-10-31 DIAGNOSIS — N1831 Chronic kidney disease, stage 3a: Secondary | ICD-10-CM

## 2018-10-31 DIAGNOSIS — Z66 Do not resuscitate: Secondary | ICD-10-CM | POA: Diagnosis not present

## 2018-10-31 DIAGNOSIS — Z85831 Personal history of malignant neoplasm of soft tissue: Secondary | ICD-10-CM

## 2018-10-31 DIAGNOSIS — M316 Other giant cell arteritis: Secondary | ICD-10-CM

## 2018-10-31 DIAGNOSIS — E034 Atrophy of thyroid (acquired): Secondary | ICD-10-CM | POA: Diagnosis not present

## 2018-10-31 DIAGNOSIS — I10 Essential (primary) hypertension: Secondary | ICD-10-CM | POA: Diagnosis not present

## 2018-10-31 NOTE — Progress Notes (Signed)
Location:  Occupational psychologist of Service:  Clinic (12)  Provider: Shandrika Ambers L. Mariea Clonts, D.O., C.M.D.  Code Status: DNR Goals of Care:  Advanced Directives 05/02/2018  Does Patient Have a Medical Advance Directive? Yes  Type of Advance Directive Out of facility DNR (pink MOST or yellow form)  Does patient want to make changes to medical advance directive? No - Patient declined  Copy of [place] of [place] in Chart? -  Would patient like information on creating a medical advance directive? -  Pre-existing out of facility DNR order (yellow form or pink MOST form) Yellow form placed in chart (order not valid for inpatient use);Pink MOST form placed in chart (order not valid for inpatient use)     Chief Complaint  Patient presents with  . Medical Management of Chronic Issues    6 month follow up    HPI: Patient is a 85 y.o. male seen today for medical management of chronic diseases.    He feels sad like he's losing his wife.  She's here for respite/rehab due to challenges with med mgt at home and need to assess her cognitive and physical deficits after her fall with subsequent reverse shoulder replacement and right hip hematomas/muscular tears, hematoma to left face and identified old stroke on her brain imaging.  He reports he only forgot her medications the first time b/c it hadn't sunken in yet.  The same thing happened with warfarin.  He remembered he didn't give it the first night and asked clinic nurse if it should be given.  She advised to only give it on time in the evening.  Apparently their daughter said he should give it right away not in the evening and that made Roberta uncomfortable.    Barnet has no concerns about his wife during the day.  Webb Silversmith wanted them to have help 24x7.  Bear disagreed about daytime needs.  Percell Locus did not feel daytime help was needed either.  Icarus agreed to the 24x7 care if Webb Silversmith did not call about this situation.  This week  has gone ok as a result.    BP at home was 132/77 before walking over here.    He is feeling more tired than usual.  He says he's sleeping like he's always slept.  However, we've heard about Angelita Ingles getting up 5-6 times at night.  Most mornings he could stay in bed two more hours.  Reports that's not changed.  He says it's hard to get things done these days.  He wants to get his office organized.  He has a lot of paperwork due to a lot of interests.   He is planning to do some gardening--planting irises.  He wants to use this to meditate.    He says he feels like he had never grown up.  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  If he could have continued flying, he would have.  He did not miss it b/c he had so many other interests.  In the past few years, he began to feel his age when he's struggles with his eyes (diplopia), the leg with his cancer, have interfered with his table tennis.    Past Medical History:  Diagnosis Date  . Aneurysm of left renal artery (HCC)    per CT 08-20-2015 stable 50mm  . 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 Bladder  neck contracture   . BPH (benign prostatic hyperplasia)   . CKD (chronic kidney disease), stage III   . Complication of anesthesia    post op acute urinary retention  . DDD (degenerative disc disease)   . Foley catheter in place    placed 02-29-2019 secondary to urinary retention post surgery  . Full dentures   . GERD (gastroesophageal reflux disease)   . History of external beam radiation therapy 2002   prostate cancer  and boost with radioative prostate seed implants  . History of prostate cancer DX  2002   S/P EXTERNAL RADIATION/ RADIOACTIVE SEED IMPLANTS  2003  . History of squamous cell carcinoma excision    2012--  RIGHT LOWER EXTREM;  2019 LEFT LOWER EXTREMITIY  . HTN (hypertension)   . Hyperlipemia   . Hypothyroidism     endocrinologist-  dr Chalmers Cater--- per pt takes his own thyroid med. called NP Thyroid 10 mg daily, does not take synthroid  . Large vessel vasculitis (HCC)    aortitis--- rheumotologist-  dr Charlestine Night  . Left renal artery stenosis (HCC)    proximal per CT 08-20-2015 but patent  . Left renal atrophy   . Macular degeneration   . Nocturia   . OSA on CPAP    pt retested 11-13-2016 at [facility] w/ dr ather-- moderate to severe OSA , AHI 16.4/h/  03-24-2017 per pt is consistant using every night  . Pulmonary nodule    per CT 02-28-2017 in [place] in epic done at [place],  LUL nodule , not mets  . Renal cyst, right   . Restless legs syndrome (RLS) 10/31/2011  . S/P radiation therapy     for tonsillar cancer (head and neck) completed 01-26-2012 at [facility])  . Saliva decreased   . Sarcoma of lower extremity, right ([place]) oncolgoist-  dr Juluis Rainier ([place])   dx 02-14-2017 w/ needle core bx;  high grade pleomorphic spindle cell sarcoma , grade 2 (cT2N0M0); 03-15-2017  radical resection sarcoma tumor right lower leg  and plan radiation  . Self-catheterizes urinary bladder    QID   AND   PRN  . 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 Urethral stricture    chronic---  post urethral dilation's  . Urinary retention with incomplete bladder emptying   . Vitamin B 12 deficiency 01/28/2011  . Vitamin D deficiency     Past Surgical History:  Procedure Laterality Date  . BALLOON DILATION N/A 11/10/2014   Procedure: CYSTO BALLOON DILATION AND RETROGRADE URETHROGRAM ;  Surgeon: Bjorn Loser, MD;  Location: [facility];  Service: Urology;  Laterality: N/A;  . CATARACT EXTRACTION W/ INTRAOCULAR LENS  IMPLANT,  BILATERAL    . CYSTO/ BALLOON DILATION OF  URETHRAL STRICTURE  12-25-2010  . CYSTOSCOPY WITH RETROGRADE URETHROGRAM N/A 03/30/2016   Procedure: CYSTOSCOPY WITH RETROGRADE URETHROGRAM AND BALLOON DILATION with cystogram;  Surgeon: Bjorn Loser, MD;  Location: [facility];  Service: Urology;  Laterality: N/A;  . CYSTOSCOPY WITH URETHRAL DILATATION  05/31/2011   Procedure: CYSTOSCOPY WITH URETHRAL DILATATION;  Surgeon: Reece Packer, MD;  Location: [place];  Service: Urology;  Laterality: N/A;  BALLOON DILATION  . CYSTOSCOPY WITH URETHRAL  DILATATION N/A 09/13/2016   Procedure: CYSTOSCOPY WITH URETHRAL BALLOON DILATATION;  Surgeon: Bjorn Loser, MD;  [facility];  Service: Urology;  Laterality: N/A;  . CYSTOSCOPY WITH URETHRAL DILATATION N/A 03/30/2017   Procedure: CYSTOSCOPY WITH BALLOON URETHRAL DILATATION;  Surgeon: Bjorn Loser, MD;  Location: [place];  Service: Urology;  Laterality: N/A;  . CYSTOSCOPY/RETROGRADE/URETEROSCOPY N/A 05/22/2012   Procedure: CYSTOSCOPY BALLOON DILATION RETROGRADE URETEROGRAM ;  Surgeon: Reece Packer, MD;  Location: [place];  Service: Urology;  Laterality: N/A;  . CYTSO/  DILATATION URETHRAL STRICTURE/  BX PROSTATIC URETHRA/  REMOVAL FOREIGN BODIES  06-25-2010   [facility]  . INGUINAL HERNIA REPAIR Right 2000  . MOHS SURGERY  01/ 2019    [facility]   left lower leg for SCC  . RADICAL RESECTION OF SARCOMA TUMOR   03-15-2017   [facility]   RIGHT LOWER LEG , CALF AREA  . RADIOACTIVE SEED IMPLANTS, PROSTATE  JAN  2003  . SKIN LESION EXCISION  07/2012   MOST  right shoulder    Allergies  Allergen Reactions  . Cefixime Rash  . Contrast Media [Iodinated Diagnostic Agents] Other (See Comments)    Avoid due to renal disease  . Nitrofurantoin Swelling  . Nsaids Other (See Comments)    Has been told no NSAIDs, Ibuprofen Renal insufficiency hx.   . Cephalosporins Rash     Outpatient Encounter Medications as of 10/31/2018  Medication Sig  . buPROPion (WELLBUTRIN) 100 MG tablet Take 1 tablet by mouth twice daily  . cholecalciferol (VITAMIN D) 1000 units tablet Take 2,000 Units by mouth daily.  . Coenzyme Q10 (COQ10) 100 MG CAPS Take 100 mg by mouth daily. Take one daily  . Cyanocobalamin-Methylcobalamin 600-600 MCG SUBL Place under the tongue daily.  Marland Kitchen DIGESTIVE ENZYMES PO Take 500 mg by mouth every morning.   . fish oil-omega-3 fatty acids 1000 MG capsule Take 2 g by mouth daily. Name:  Natural Factors Omega-3  . lisinopril (ZESTRIL) 10 MG tablet Take 1 tablet by mouth once daily  . Multiple Vitamins-Minerals (ICAPS AREDS 2 PO) Take 2 tablets by mouth daily.  . NP THYROID 90 MG tablet Take 90 mg by mouth daily.  . predniSONE (DELTASONE) 5 MG tablet Take 2.5 mg by mouth daily with breakfast.   . RESVERATROL PO Take 100 mg by mouth 2 (two) times daily.   . TURMERIC CURCUMIN PO Take 2 tablets by mouth daily.   Marland Kitchen UNABLE TO FIND [place] Gems take once daily  . UNABLE TO FIND [place] Bone take once daily  . UNABLE TO FIND [place] take 4 tablets once daily  . UNABLE TO FIND Comprehensive immune support take 2 tablets once daily  . UNABLE TO FIND Modified citrus pectin take 4 tablets once daily  . UNABLE TO FIND Celery seed take 2 tablets by mouth once daily  . UNABLE TO FIND Cataplex B take 4 tablets once daily  . UNABLE TO FIND [place] plus take 4 tablets by mouth once daily  . UNABLE TO FIND Cataplex C take 4 tablets once daily  . UNABLE TO FIND Cataplex E take 4 tablets once daily  . UNABLE TO FIND Arginex take 4 tablets once daily  . UNABLE TO FIND Min-Tran take 4 tablet once daily  . UNABLE TO FIND Immuplex take 4 tablets once daily   No facility-administered encounter medications on file as of 10/31/2018.     Review of Systems:  Review of  Systems  Constitutional: Positive for malaise/fatigue. Negative for chills and fever.  HENT: Negative for  congestion and hearing loss.   Eyes: Negative for blurred vision.  Respiratory: Negative for cough and shortness of breath.   Cardiovascular: Negative for chest pain, palpitations and leg swelling.  Gastrointestinal: Negative for abdominal pain, blood in stool, constipation, diarrhea and melena.  Genitourinary: Negative for dysuria.  Musculoskeletal: Negative for falls and joint pain.       Atrophy of muscle in leg where he had sarcoma  Skin: Negative for rash.  Neurological: Negative for dizziness and loss of consciousness.  Endo/Heme/Allergies: Bruises/bleeds easily.  Psychiatric/Behavioral: Positive for depression and memory loss. The patient has insomnia. The patient is not nervous/anxious.        Has had little sleep when his wife was home with him    Health Maintenance  Topic Date Due  . TETANUS/TDAP  01/17/2013  . INFLUENZA VACCINE  08/18/2018  . PNA vac Low Risk Adult  Completed    Physical Exam: Vitals:   10/31/18 0935  BP: (!) 152/90  Pulse: 67  Temp: 98.3 F (36.8 C)  TempSrc: Oral  SpO2: 96%  Weight: 164 lb 3.2 oz (74.5 kg)  Height: 5\' 11"  (1.803 m)   Body mass index is 22.9 kg/m. Physical Exam Vitals signs reviewed.  Constitutional:      General: He is not in acute distress.    Appearance: Normal appearance. He is not toxic-appearing.     Comments: Appears fatigued, became tearful more than once during visit  HENT:     Head: Normocephalic and atraumatic.  Cardiovascular:     Rate and Rhythm: Normal rate and regular rhythm.  Pulmonary:     Effort: Pulmonary effort is normal.     Breath sounds: Normal breath sounds.  Abdominal:     General: Bowel sounds are normal.  Musculoskeletal: Normal range of motion.     Right lower leg: No edema.     Left lower leg: No edema.  Skin:    General: Skin is warm and dry.  Neurological:     General: No focal deficit present.     Mental Status: He is alert and oriented to person, place, and time.  Psychiatric:         Mood and Affect: Mood normal.     Labs reviewed: Basic Metabolic Panel: Recent Labs    05/03/18 0700  TSH 3.85   Liver Function Tests: No results for input(s): AST, ALT, ALKPHOS, BILITOT, PROT, ALBUMIN in the last 8760 hours. No results for input(s): LIPASE, AMYLASE in the last 8760 hours. No results for input(s): AMMONIA in the last 8760 hours. CBC: No results for input(s): WBC, NEUTROABS, HGB, HCT, MCV, PLT in the last 8760 hours. Lipid Panel: Recent Labs    05/03/18  CHOL 253*  HDL 79*  LDLCALC 157  TRIG 81   Assessment/Plan 1. Moderate episode of recurrent major depressive disorder (HCC) -worse at present due to his wife's recent fall and complications of it -continue wellbutrin low dose that he's been taking -will need continued support from [facility] team to help him adjust to his wife's decreased independence after her fall -I've gradually been noting increased struggles with recall of our prior discussions about his health  -difficult to tell how much is cognitive vs. Depression--need to continue to monitor and will need MMSE when seen next and ideally at [facility]  2. Large vessel vasculitis ([place]) -continues on low dose prednisone therapy, followed by rheumatology  3. History of sarcoma -s/p surgery and radiation -follow[facility] CT[facility] and [facility]   4. Essential hypertension -bp high today,he was very upset about his wife at present -cont ace, but take faithfully  5. Stage 3a chronic kidney disease -Avoid nephrotoxic agents like nsaids, dose adjust renally excreted meds, hydrate. -must be careful with supplements  6. Hypothyroidism due to acquired atrophy of thyroid Lab Results  Component Value Date   TSH 3.85 05/03/2018   Note:  Pt takes numerous supplements and has not provided info about all of these to me.  He does not faithfully take his ace inhibitor for his bp.  Labs/tests ordered:  * No order type specified * Next appt:  Visit date  not found  Cyan Clippinger L. Renaud Celli, D.O. [facility] [address], [place] [address] Cell Phone (Mon-Fri 8am-5pm):  [phone] On Call:  [phone] & follow prompts after 5pm & weekends Office Phone:  [phone] Office Fax:  [phone]

## 2018-11-08 ENCOUNTER — Ambulatory Visit (INDEPENDENT_AMBULATORY_CARE_PROVIDER_SITE_OTHER): Payer: Medicare Other | Admitting: Nurse Practitioner

## 2018-11-08 ENCOUNTER — Encounter: Payer: Self-pay | Admitting: Nurse Practitioner

## 2018-11-08 ENCOUNTER — Other Ambulatory Visit: Payer: Self-pay

## 2018-11-08 DIAGNOSIS — Z0000 Encounter for general adult medical examination without abnormal findings: Secondary | ICD-10-CM

## 2018-11-08 NOTE — Progress Notes (Signed)
Subjective:   Philip [name] is a 85 y.o. male who presents for Medicare Annual/Subsequent preventive examination.  Review of Systems:   Cardiac Risk Factors include: advanced age (>86men, >18 women);hypertension;male gender     Objective:    Vitals: There were no vitals taken for this visit.  There is no height or weight on file to calculate BMI.  Advanced Directives 11/08/2018 05/02/2018 08/08/2017 07/10/2017 07/05/2017 03/30/2017 03/21/2017  Does Patient Have a Medical Advance Directive? Yes Yes Yes Yes Yes Yes Yes  Type of Advance Directive Out of facility DNR (pink MOST or yellow form) Out of facility DNR (pink MOST or yellow form) [place];Out of facility DNR (pink MOST or yellow form) [place];Out of facility DNR (pink MOST or yellow form) [place];Out of facility DNR (pink MOST or yellow form) [place];Living will [place];Out of facility DNR (pink MOST or yellow form)  Does patient want to make changes to medical advance directive? No - Patient declined No - Patient declined No - Patient declined - No - Patient declined - No - Patient declined  Copy of [place] in Chart? - - Yes Yes Yes No - copy requested Yes  Would patient like information on creating a medical advance directive? - - - - - - -  Pre-existing out of facility DNR order (yellow form or pink MOST form) Pink MOST form placed in chart (order not valid for inpatient use) Yellow form placed in chart (order not valid for inpatient use);Pink MOST form placed in chart (order not valid for inpatient use) Yellow form placed in chart (order not valid for inpatient use) Yellow form placed in chart (order not valid for inpatient use) Yellow form placed in chart (order not valid for inpatient use) - Yellow form placed in chart (order not valid for inpatient use)    Tobacco Social History   Tobacco Use  Smoking  Status Never Smoker  Smokeless Tobacco Never Used     Counseling given: Not Answered   Clinical Intake:  Pre-visit preparation completed: Yes  Pain : No/denies pain     BMI - recorded: 22.9 Nutritional Status: BMI of 19-24  Normal Nutritional Risks: None Diabetes: No  How often do you need to have someone help you when you read instructions, pamphlets, or other written materials from your doctor or pharmacy?: 1 - Never What is the last grade level you completed in school?: college  Interpreter Needed?: No     Past Medical History:  Diagnosis Date  . Aneurysm of left renal artery (HCC)    per CT 08-20-2015 stable 96mm  . 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 Basal cell carcinoma   . Bladder neck contracture   . BPH (benign prostatic hyperplasia)   . CKD (chronic kidney disease), stage III   . Complication of anesthesia    post op acute urinary retention  . DDD (degenerative disc disease)   . Foley catheter in place    placed 02-29-2019 secondary to urinary retention post surgery  . Full dentures   . GERD (gastroesophageal reflux disease)   . History of external beam radiation therapy 2002   prostate cancer  and boost with radioative prostate seed implants  . History of prostate cancer DX  2002   S/P EXTERNAL RADIATION/ RADIOACTIVE SEED IMPLANTS  2003  . History of squamous cell carcinoma  excision    2012--  RIGHT LOWER EXTREM;  2019 LEFT LOWER EXTREMITIY  . HTN (hypertension)   . Hyperlipemia   . Hypothyroidism    endocrinologist-  dr Chalmers Cater--- per pt takes his own thyroid med. called NP Thyroid 10 mg daily, does not take synthroid  . Large vessel vasculitis (HCC)    aortitis--- rheumotologist-  dr Charlestine Night  . Left renal artery stenosis (HCC)    proximal per CT 08-20-2015 but patent  . Left renal atrophy   . Macular degeneration   . Nocturia   . OSA on CPAP    pt retested 11-13-2016  at [facility] w/ dr ather-- moderate to severe OSA , AHI 16.4/h/  03-24-2017 per pt is consistant using every night  . Pulmonary nodule    per CT 02-28-2017 in [place] in epic done at [place],  LUL nodule , not mets  . Renal cyst, right   . Restless legs syndrome (RLS) 10/31/2011  . S/P radiation therapy     for tonsillar cancer (head and neck) completed 01-26-2012 at [facility])  . Saliva decreased   . Sarcoma of lower extremity, right ([place]) oncolgoist-  dr Juluis Rainier ([place])   dx 02-14-2017 w/ needle core bx;  high grade pleomorphic spindle cell sarcoma , grade 2 (cT2N0M0); 03-15-2017  radical resection sarcoma tumor right lower leg  and plan radiation  . Self-catheterizes urinary bladder    QID   AND   PRN  . Tonsillar cancer ([place]) 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+) (Stage IVA)---- dx oct 2013  ----s/p concurent chemo and radiation/  ended 01-26-2012-- no surgical intervention---  residuals ( dry mouth, decreased saliva)   oncologist at [place]--  dr brizel--  HX OF -- NO RECURRENCE  . Urethral stricture    chronic---  post urethral dilation's  . Urinary retention with incomplete bladder emptying   . Vitamin B 12 deficiency 01/28/2011  . Vitamin D deficiency    Past Surgical History:  Procedure Laterality Date  . BALLOON DILATION N/A 11/10/2014   Procedure: CYSTO BALLOON DILATION AND RETROGRADE URETHROGRAM ;  Surgeon: Bjorn Loser, MD;  [facility];  Service: Urology;  Laterality: N/A;  . CATARACT EXTRACTION W/ INTRAOCULAR LENS  IMPLANT, BILATERAL    . CYSTO/ BALLOON DILATION OF  URETHRAL STRICTURE  12-25-2010  . CYSTOSCOPY WITH RETROGRADE URETHROGRAM N/A 03/30/2016   Procedure: CYSTOSCOPY WITH RETROGRADE URETHROGRAM AND BALLOON DILATION with cystogram;  Surgeon: Bjorn Loser, MD;  Location: [facility];  Service: Urology;  Laterality: N/A;  . CYSTOSCOPY WITH URETHRAL DILATATION  05/31/2011    Procedure: CYSTOSCOPY WITH URETHRAL DILATATION;  Surgeon: Reece Packer, MD;  Location: [place];  Service: Urology;  Laterality: N/A;  BALLOON DILATION  . CYSTOSCOPY WITH URETHRAL DILATATION N/A 09/13/2016   Procedure: CYSTOSCOPY WITH URETHRAL BALLOON DILATATION;  Surgeon: Bjorn Loser, MD;  Location: [place];  Service: Urology;  Laterality: N/A;  . CYSTOSCOPY WITH URETHRAL DILATATION N/A 03/30/2017   Procedure: CYSTOSCOPY WITH BALLOON URETHRAL DILATATION;  Surgeon: Bjorn Loser, MD;  Location: [place];  Service: Urology;  Laterality: N/A;  . CYSTOSCOPY/RETROGRADE/URETEROSCOPY N/A 05/22/2012   Procedure: CYSTOSCOPY BALLOON DILATION RETROGRADE URETEROGRAM ;  Surgeon: Reece Packer, MD;  Location: [place];  Service: Urology;  Laterality: N/A;  . CYTSO/  DILATATION URETHRAL STRICTURE/  BX PROSTATIC URETHRA/  REMOVAL FOREIGN BODIES  06-25-2010   [facility]  . INGUINAL  HERNIA REPAIR Right 2000  . MOHS SURGERY  01/ 2019    [facility]   left lower leg for SCC  . RADICAL RESECTION OF SARCOMA TUMOR   03-15-2017   [facility]   RIGHT LOWER LEG , CALF AREA  . RADIOACTIVE SEED IMPLANTS, PROSTATE  JAN  2003  . SKIN LESION EXCISION  07/2012   MOST  right shoulder   Family History  Problem Relation Age of Onset  . Stroke Father    Social History   Socioeconomic History  . Marital status: Married    Spouse name: Not on file  . Number of children: 4  . Years of education: college  . Highest education level: Not on file  Occupational History  . Occupation: retired  Scientific laboratory technician  . Financial resource strain: Not hard at all  . Food insecurity    Worry: Never true    Inability: Never true  . Transportation needs    Medical: No    Non-medical: No  Tobacco Use  . Smoking status: Never Smoker  . Smokeless tobacco: Never Used  Substance and Sexual Activity  . Alcohol use: Yes    Alcohol/week: 7.0 standard drinks    Types: 7  Glasses of wine per week    Comment: 8oz, glass or red wine  . Drug use: No  . Sexual activity: Not on file  Lifestyle  . Physical activity    Days per week: 3 days    Minutes per session: 30 min  . Stress: Only a little  Relationships  . Social connections    Talks on phone: More than three times a week    Gets together: More than three times a week    Attends religious service: Never    Active member of club or organization: No    Attends meetings of clubs or organizations: Never    Relationship status: Married  Other Topics Concern  . 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     Outpatient Encounter Medications as of 11/08/2018  Medication Sig  . buPROPion (WELLBUTRIN) 100 MG tablet Take 1 tablet by mouth twice daily  . cholecalciferol (VITAMIN D) 1000 units tablet Take 2,000 Units by mouth daily.  . Coenzyme Q10 (COQ10) 100 MG CAPS Take 100 mg by mouth daily. Take one daily  . Cyanocobalamin-Methylcobalamin 600-600 MCG SUBL Place under the tongue daily.  Marland Kitchen DIGESTIVE ENZYMES PO Take 500 mg by mouth every morning.   . fish oil-omega-3 fatty acids 1000 MG capsule Take 2 g by mouth daily. Name:  Natural Factors Omega-3  . lisinopril (ZESTRIL) 10 MG tablet Take 10 mg by mouth every other day.  . Multiple Vitamins-Minerals (ICAPS AREDS 2 PO) Take 2 tablets by mouth daily.  . NP THYROID 90 MG tablet Take 90 mg by mouth daily.  . predniSONE (DELTASONE) 5 MG tablet Take 7.5 mg by mouth daily with breakfast.   . RESVERATROL PO Take 100 mg by mouth daily.   . TURMERIC CURCUMIN PO Take 2 tablets by mouth daily.   Marland Kitchen UNABLE TO FIND every other day. Mercie Eon take once daily - Vitamin E  . UNABLE TO FIND [place] take once daily  . UNABLE TO FIND [place] take 4 tablets once daily  . UNABLE TO FIND Comprehensive immune support take 2 tablets once daily  . UNABLE  TO FIND Modified citrus pectin take 4 tablets once daily  . UNABLE TO FIND Celery seed take 2 tablets by mouth once daily  . [DISCONTINUED] lisinopril (ZESTRIL) 10 MG tablet Take 1 tablet by mouth once daily  . [DISCONTINUED] UNABLE TO FIND Cataplex B take 4 tablets once daily  . [DISCONTINUED] UNABLE TO FIND [place] plus take 4 tablets by mouth once daily  . [DISCONTINUED] UNABLE TO FIND Cataplex C take 4 tablets once daily  . [DISCONTINUED] UNABLE TO FIND Cataplex E take 4 tablets once daily  . [DISCONTINUED] UNABLE TO FIND Arginex take 4 tablets once daily  . [DISCONTINUED] UNABLE TO FIND Min-Tran take 4 tablet once daily  . [DISCONTINUED] UNABLE TO FIND Immuplex take 4 tablets once daily   No facility-administered encounter medications on file as of 11/08/2018.     Activities of Daily Living In your present state of health, do you have any difficulty performing the following activities: 11/08/2018  Hearing? Y  Comment some hearing loss  Vision? Y  Comment needs to update prescription, sees eye doctor this month  Difficulty concentrating or making decisions? N  Walking or climbing stairs? N  Dressing or bathing? N  Doing errands, shopping? N  Preparing Food and eating ? N  Using the Toilet? N  In the past six months, have you accidently leaked urine? N  Do you have problems with loss of bowel control? N  Managing your Medications? N  Managing your Finances? N  Housekeeping or managing your Housekeeping? N  Some recent data might be hidden    Patient Care Team: Gayland Curry, DO as PCP - General (Geriatric Medicine) Bjorn Loser, MD as Consulting Physician (Urology) [facility], Youlanda Roys, MD (Radiation Oncology) Jacelyn Pi, MD as Consulting Physician (Endocrinology) Cherre Blanc Mayford Knife, MD as Referring Physician (Orthopedic Surgery)   Assessment:   This is a routine wellness examination for [place].  Exercise Activities and Dietary  recommendations Current Exercise Habits: Home exercise routine, Type of exercise: walking, Time (Minutes): 40, Frequency (Times/Week): 3, Weekly Exercise (Minutes/Week): 120, Intensity: Mild  Goals    . maintain health     Maintain healthy lifestyle with proper diet and exercise.        Fall Risk Fall Risk  11/08/2018 05/02/2018 12/20/2017 11/01/2017 09/06/2017  Falls in the past year? 0 0 0 No No  Number falls in past yr: - 0 0 - -  Injury with Fall? - 0 0 - -  Comment - - - - -   Is the patient's home free of loose throw rugs in walkways, pet beds, electrical cords, etc?   yes      Grab bars in the bathroom? yes      Handrails on the stairs?   no stairs      Adequate lighting?   yes  Timed Get Up and Go Performed: na  Depression Screen PHQ 2/9 Scores 11/08/2018 05/02/2018 12/20/2017 11/01/2017  PHQ - 2 Score 0 0 0 0    Cognitive Function MMSE - Mini Mental State Exam 08/08/2017 08/02/2016 05/20/2015  Orientation to time 5 4 5   Orientation to Place 5 5 5   Registration 3 3 3   Attention/ Calculation 5 5 5   Recall 3 3 3   Language- name 2 objects 2 2 2   Language- repeat 1 1 1   Language- follow 3 step command 3 3 3   Language- read & follow direction 1 1 1   Write a sentence 1 1 1  Copy design 1 1 1   Total score 30 29 30      6CIT Screen 11/08/2018  What Year? 0 points  What month? 0 points  What time? 0 points  Count back from 20 0 points  Months in reverse 0 points  Repeat phrase 0 points  Total Score 0    Immunization History  Administered Date(s) Administered  . Pneumococcal Conjugate-13 05/06/2015  . Pneumococcal Polysaccharide-23 01/18/2003  . Td 01/18/2003    Qualifies for Shingles Vaccine? yes  Screening Tests Health Maintenance  Topic Date Due  . TETANUS/TDAP  01/17/2013  . INFLUENZA VACCINE  08/18/2018  . PNA vac Low Risk Adult  Completed   Cancer Screenings: Lung: Low Dose CT Chest recommended if Age 5-80 years, 30 pack-year currently smoking OR  have quit w/in 15years. Patient does not qualify. Colorectal: aged out  Additional Screenings: Hepatitis C Screening:na      Plan:     I have personally reviewed and noted the following in the patient's chart:   . Medical and social history . Use of alcohol, tobacco or illicit drugs  . Current medications and supplements . Functional ability and status . Nutritional status . Physical activity . Advanced directives . List of other physicians . Hospitalizations, surgeries, and ER visits in previous 12 months . Vitals . Screenings to include cognitive, depression, and falls . Referrals and appointments  In addition, I have reviewed and discussed with patient certain preventive protocols, quality metrics, and best practice recommendations. A written personalized care plan for preventive services as well as general preventive health recommendations were provided to patient.     Lauree Chandler, NP  11/08/2018

## 2018-11-08 NOTE — Patient Instructions (Signed)
Philip [name] , Thank you for taking time to come for your Medicare Wellness Visit. I appreciate your ongoing commitment to your health goals. Please review the following plan we discussed and let me know if I can assist you in the future.   Screening recommendations/referrals: Colonoscopy- aged out. Recommended yearly ophthalmology/optometry visit for glaucoma screening and checkup Recommended yearly dental visit for hygiene and checkup  Vaccinations: Influenza vaccine- recommended to get- [facility].  Pneumococcal vaccine up to date Tdap vaccine -to get at your local pharmacy  Shingles vaccine  Recommended to take throug your local pharmacy     Advanced directives: on file   Conditions/risks identified: none.   Next appointment: 1 year  Preventive Care 24 Years and Older, Male Preventive care refers to lifestyle choices and visits with your health care provider that can promote health and wellness. What does preventive care include?  A yearly physical exam. This is also called an annual well check.  Dental exams once or twice a year.  Routine eye exams. Ask your health care provider how often you should have your eyes checked.  Personal lifestyle choices, including:  Daily care of your teeth and gums.  Regular physical activity.  Eating a healthy diet.  Avoiding tobacco and drug use.  Limiting alcohol use.  Practicing safe sex.  Taking low doses of aspirin every day.  Taking vitamin and mineral supplements as recommended by your health care provider. What happens during an annual well check? The services and screenings done by your health care provider during your annual well check will depend on your age, overall health, lifestyle risk factors, and family history of disease. Counseling  Your health care provider may ask you questions about your:  Alcohol use.  Tobacco use.  Drug use.  Emotional well-being.  Home and relationship  well-being.  Sexual activity.  Eating habits.  History of falls.  Memory and ability to understand (cognition).  Work and work Statistician. Screening  You may have the following tests or measurements:  Height, weight, and BMI.  Blood pressure.  Lipid and cholesterol levels. These may be checked every 5 years, or more frequently if you are over 64 years old.  Skin check.  Lung cancer screening. You may have this screening every year starting at age 38 if you have a 30-pack-year history of smoking and currently smoke or have quit within the past 15 years.  Fecal occult blood test (FOBT) of the stool. You may have this test every year starting at age 32.  Flexible sigmoidoscopy or colonoscopy. You may have a sigmoidoscopy every 5 years or a colonoscopy every 10 years starting at age 65.  Prostate cancer screening. Recommendations will vary depending on your family history and other risks.  Hepatitis C blood test.  Hepatitis B blood test.  Sexually transmitted disease (STD) testing.  Diabetes screening. This is done by checking your blood sugar (glucose) after you have not eaten for a while (fasting). You may have this done every 1-3 years.  Abdominal aortic aneurysm (AAA) screening. You may need this if you are a current or former smoker.  Osteoporosis. You may be screened starting at age 41 if you are at high risk. Talk with your health care provider about your test results, treatment options, and if necessary, the need for more tests. Vaccines  Your health care provider may recommend certain vaccines, such as:  Influenza vaccine. This is recommended every year.  Tetanus, diphtheria, and acellular pertussis (  Tdap, Td) vaccine. You may need a Td booster every 10 years.  Zoster vaccine. You may need this after age 33.  Pneumococcal 13-valent conjugate (PCV13) vaccine. One dose is recommended after age 26.  Pneumococcal polysaccharide (PPSV23) vaccine. One dose is  recommended after age 47. Talk to your health care provider about which screenings and vaccines you need and how often you need them. This information is not intended to replace advice given to you by your health care provider. Make sure you discuss any questions you have with your health care provider. Document Released: 01/30/2015 Document Revised: 09/23/2015 Document Reviewed: 11/04/2014 Elsevier Interactive Patient Education  2017 [place] Prevention in the Home Falls can cause injuries. They can happen to people of all ages. There are many things you can do to make your home safe and to help prevent falls. What can I do on the outside of my home?  Regularly fix the edges of walkways and driveways and fix any cracks.  Remove anything that might make you trip as you walk through a door, such as a raised step or threshold.  Trim any bushes or trees on the path to your home.  Use bright outdoor lighting.  Clear any walking paths of anything that might make someone trip, such as rocks or tools.  Regularly check to see if handrails are loose or broken. Make sure that both sides of any steps have handrails.  Any raised decks and porches should have guardrails on the edges.  Have any leaves, snow, or ice cleared regularly.  Use sand or salt on walking paths during winter.  Clean up any spills in your garage right away. This includes oil or grease spills. What can I do in the bathroom?  Use night lights.  Install grab bars by the toilet and in the tub and shower. Do not use towel bars as grab bars.  Use non-skid mats or decals in the tub or shower.  If you need to sit down in the shower, use a plastic, non-slip stool.  Keep the floor dry. Clean up any water that spills on the floor as soon as it happens.  Remove soap buildup in the tub or shower regularly.  Attach bath mats securely with double-sided non-slip rug tape.  Do not have throw rugs and other things on  the floor that can make you trip. What can I do in the bedroom?  Use night lights.  Make sure that you have a light by your bed that is easy to reach.  Do not use any sheets or blankets that are too big for your bed. They should not hang down onto the floor.  Have a firm chair that has side arms. You can use this for support while you get dressed.  Do not have throw rugs and other things on the floor that can make you trip. What can I do in the kitchen?  Clean up any spills right away.  Avoid walking on wet floors.  Keep items that you use a lot in easy-to-reach places.  If you need to reach something above you, use a strong step stool that has a grab bar.  Keep electrical cords out of the way.  Do not use floor polish or wax that makes floors slippery. If you must use wax, use non-skid floor wax.  Do not have throw rugs and other things on the floor that can make you trip. What can I do with my stairs?  Do  not leave any items on the stairs.  Make sure that there are handrails on both sides of the stairs and use them. Fix handrails that are broken or loose. Make sure that handrails are as long as the stairways.  Check any carpeting to make sure that it is firmly attached to the stairs. Fix any carpet that is loose or worn.  Avoid having throw rugs at the top or bottom of the stairs. If you do have throw rugs, attach them to the floor with carpet tape.  Make sure that you have a light switch at the top of the stairs and the bottom of the stairs. If you do not have them, ask someone to add them for you. What else can I do to help prevent falls?  Wear shoes that:  Do not have high heels.  Have rubber bottoms.  Are comfortable and fit you well.  Are closed at the toe. Do not wear sandals.  If you use a stepladder:  Make sure that it is fully opened. Do not climb a closed stepladder.  Make sure that both sides of the stepladder are locked into place.  Ask someone to  hold it for you, if possible.  Clearly mark and make sure that you can see:  Any grab bars or handrails.  First and last steps.  Where the edge of each step is.  Use tools that help you move around (mobility aids) if they are needed. These include:  Canes.  Walkers.  Scooters.  Crutches.  Turn on the lights when you go into a dark area. Replace any light bulbs as soon as they burn out.  Set up your furniture so you have a clear path. Avoid moving your furniture around.  If any of your floors are uneven, fix them.  If there are any pets around you, be aware of where they are.  Review your medicines with your doctor. Some medicines can make you feel dizzy. This can increase your chance of falling. Ask your doctor what other things that you can do to help prevent falls. This information is not intended to replace advice given to you by your health care provider. Make sure you discuss any questions you have with your health care provider. Document Released: 10/30/2008 Document Revised: 06/11/2015 Document Reviewed: 02/07/2014 Elsevier Interactive Patient Education  2017 [employer].

## 2018-11-08 NOTE — Progress Notes (Signed)
This service is provided via telemedicine  No vital signs collected/recorded due to the encounter was a telemedicine visit.   Location of patient (ex: home, work):  Home  Patient consents to a telephone visit:  Yes  Location of the provider (ex: office, home):  [facility]   Name of any referring provider:  Hollace Kinnier, DO  Names of all persons participating in the telemedicine service and their role in the encounter:  Bonney Leitz, [place]; Sherrie Mustache, NP; patient   Time spent on call:  8.32 minutes CMA time only

## 2018-11-09 DIAGNOSIS — N1831 Chronic kidney disease, stage 3a: Secondary | ICD-10-CM | POA: Diagnosis not present

## 2018-11-09 DIAGNOSIS — M316 Other giant cell arteritis: Secondary | ICD-10-CM | POA: Diagnosis not present

## 2018-11-09 DIAGNOSIS — N320 Bladder-neck obstruction: Secondary | ICD-10-CM | POA: Diagnosis not present

## 2018-11-09 DIAGNOSIS — C499 Malignant neoplasm of connective and soft tissue, unspecified: Secondary | ICD-10-CM | POA: Diagnosis not present

## 2018-11-09 DIAGNOSIS — E559 Vitamin D deficiency, unspecified: Secondary | ICD-10-CM | POA: Diagnosis not present

## 2018-11-14 DIAGNOSIS — D0462 Carcinoma in situ of skin of left upper limb, including shoulder: Secondary | ICD-10-CM | POA: Diagnosis not present

## 2018-11-14 DIAGNOSIS — C44519 Basal cell carcinoma of skin of other part of trunk: Secondary | ICD-10-CM | POA: Diagnosis not present

## 2018-11-15 DIAGNOSIS — R41841 Cognitive communication deficit: Secondary | ICD-10-CM | POA: Diagnosis not present

## 2018-11-15 DIAGNOSIS — R41840 Attention and concentration deficit: Secondary | ICD-10-CM | POA: Diagnosis not present

## 2018-11-15 DIAGNOSIS — R4189 Other symptoms and signs involving cognitive functions and awareness: Secondary | ICD-10-CM | POA: Diagnosis not present

## 2018-12-06 DIAGNOSIS — H353133 Nonexudative age-related macular degeneration, bilateral, advanced atrophic without subfoveal involvement: Secondary | ICD-10-CM | POA: Diagnosis not present

## 2018-12-06 DIAGNOSIS — H532 Diplopia: Secondary | ICD-10-CM | POA: Diagnosis not present

## 2018-12-06 DIAGNOSIS — H52203 Unspecified astigmatism, bilateral: Secondary | ICD-10-CM | POA: Diagnosis not present

## 2018-12-06 DIAGNOSIS — H0014 Chalazion left upper eyelid: Secondary | ICD-10-CM | POA: Diagnosis not present

## 2018-12-10 ENCOUNTER — Other Ambulatory Visit: Payer: Self-pay

## 2018-12-10 ENCOUNTER — Encounter: Payer: Self-pay | Admitting: Family

## 2018-12-10 ENCOUNTER — Ambulatory Visit (INDEPENDENT_AMBULATORY_CARE_PROVIDER_SITE_OTHER): Payer: Medicare Other | Admitting: Family

## 2018-12-10 VITALS — BP 142/82 | HR 60 | Temp 98.0°F | Ht 71.0 in | Wt 167.8 lb

## 2018-12-10 DIAGNOSIS — L853 Xerosis cutis: Secondary | ICD-10-CM

## 2018-12-10 DIAGNOSIS — R600 Localized edema: Secondary | ICD-10-CM

## 2018-12-10 NOTE — Progress Notes (Signed)
Provider: Sallie Maker FNP-C  Gayland Curry, DO  Patient Care Team: Gayland Curry, DO as PCP - General (Geriatric Medicine) Bjorn Loser, MD as Consulting Physician (Urology) [facility] Brizel, Youlanda Roys, MD (Radiation Oncology) Jacelyn Pi, MD as Consulting Physician (Endocrinology) Cherre Blanc Mayford Knife, MD as Referring Physician (Orthopedic Surgery)  Extended Emergency Contact Information Primary Emergency Contact: Knightly,Roberta Address: [address]          [place], [place] [address] Johnnette Litter of [place] Phone: [phone] Mobile Phone: [phone] Relation: Spouse Secondary Emergency Contact: Bjorn Loser,  [address] Johnnette Litter of [place] Phone: [phone] Mobile Phone: [phone] Relation: Daughter  Code Status:  DNR Goals of care: Advanced Directive information Advanced Directives 11/08/2018  Does Patient Have a Medical Advance Directive? Yes  Type of Advance Directive Out of facility DNR (pink MOST or yellow form)  Does patient want to make changes to medical advance directive? No - Patient declined  Copy of [place] in Chart? -  Would patient like information on creating a medical advance directive? -  Pre-existing out of facility DNR order (yellow form or pink MOST form) Pink MOST form placed in chart (order not valid for inpatient use)     Chief Complaint  Patient presents with   Acute Visit    Swollen Left Leg patient states noticed swelling back in august. Patient's daughter states there is a huge rash on leg as well.    Quality Metric Gaps    Patient refused flu vaccine     HPI:  Pt is a 85 y.o. male seen today  for an acute visit for evaluation of left leg swelling since August,2020.he states the whole leg swollen from foot on thigh area.He denies any pain,tenderness or warm to touch.He has had no injury or trauma to left leg.He states has a  history of cancer on the right leg.He is here with daughter who states patient called her yesterday and stated leg was more swollen.Daughter states patient has upcoming appointment with his oncologist schedule for an ultrasound for the left leg and a CT scan for the chest due to medical history of sarcoma.Both daughter and patient states will wait for procedure to be done tomorrow.Discussed with patient to seek medical help in ED or call 9-11 if he develops chest pains,shortness of breath or symptoms worsen.Both verbalized understanding.  On chart review,patient follows up with Dr[facility]curtis at [facility].   Past Medical History:  Diagnosis Date   Aneurysm of left renal artery ([place])    per CT 08-20-2015 stable 50mm   Aortitis syndrome ([place]) rheumotologist-  dr Charlestine Night   IgG4 syndrome-- treatment prednisone- (effects abdomine)   Basal cell carcinoma    Bladder neck contracture    BPH (benign prostatic hyperplasia)    CKD (chronic kidney disease), stage III    Complication of anesthesia    post op acute urinary retention   DDD (degenerative disc disease)    Foley catheter in place    placed 02-29-2019 secondary to urinary retention post surgery   Full dentures    GERD (gastroesophageal reflux disease)    History of external beam radiation therapy 2002   prostate cancer  and boost with radioative prostate seed implants   History of prostate cancer DX  2002   S/P EXTERNAL RADIATION/ RADIOACTIVE SEED IMPLANTS  2003   History of squamous cell carcinoma excision    2012--  RIGHT LOWER EXTREM;  2019 LEFT LOWER EXTREMITIY   HTN (hypertension)    Hyperlipemia    Hypothyroidism    endocrinologist-  dr Chalmers Cater--- per pt takes his own thyroid med. called NP Thyroid 10 mg daily, does not take synthroid   Large vessel vasculitis ([facility])    aortitis--- rheumotologist-  dr Charlestine Night   Left renal artery stenosis ([place])    proximal per CT 08-20-2015 but patent   Left renal atrophy      Macular degeneration    Nocturia    OSA on CPAP    pt retested 11-13-2016 at [facility] w/ dr Dia Sitter-- moderate to severe OSA , AHI 16.4/h/  03-24-2017 per pt is consistant using every night   Pulmonary nodule    per CT 02-28-2017 in [place] in epic done at [place],  LUL nodule , not mets   Renal cyst, right    Restless legs syndrome (RLS) 10/31/2011   S/P radiation therapy     for tonsillar cancer (head and neck) completed 01-26-2012 at [place] (70Gy)   Saliva decreased    Sarcoma of lower extremity, right ([place]) oncolgoist-  dr Juluis Rainier ([facility])   dx 02-14-2017 w/ needle core bx;  high grade pleomorphic spindle cell sarcoma , grade 2 (cT2N0M0); 03-15-2017  radical resection sarcoma tumor right lower leg  and plan radiation   Self-catheterizes urinary bladder    QID   AND   PRN   Tonsillar cancer ([place]) 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+) (Stage IVA)---- dx oct 2013  ----s/p concurent chemo and radiation/  ended 01-26-2012-- no surgical intervention---  residuals ( dry mouth, decreased saliva)   oncologist at [facility]--  dr brizel--  HX OF -- NO RECURRENCE   Urethral stricture    chronic---  post urethral dilation's   Urinary retention with incomplete bladder emptying    Vitamin B 12 deficiency 01/28/2011   Vitamin D deficiency    Past Surgical History:  Procedure Laterality Date   BALLOON DILATION N/A 11/10/2014   Procedure: CYSTO BALLOON DILATION AND RETROGRADE URETHROGRAM ;  Surgeon: Bjorn Loser, MD;  Location: [place];  Service: Urology;  Laterality: N/A;   CATARACT EXTRACTION W/ INTRAOCULAR LENS  IMPLANT, BILATERAL     CYSTO/ BALLOON DILATION OF  URETHRAL STRICTURE  12-25-2010   CYSTOSCOPY WITH RETROGRADE URETHROGRAM N/A 03/30/2016   Procedure: CYSTOSCOPY WITH RETROGRADE URETHROGRAM AND BALLOON DILATION with cystogram;  Surgeon: Bjorn Loser, MD;  Location: [facility];  Service:  Urology;  Laterality: N/A;   CYSTOSCOPY WITH URETHRAL DILATATION  05/31/2011   Procedure: CYSTOSCOPY WITH URETHRAL DILATATION;  Surgeon: Reece Packer, MD;  Location: [place];  Service: Urology;  Laterality: N/A;  BALLOON DILATION   CYSTOSCOPY WITH URETHRAL DILATATION N/A 09/13/2016   Procedure: CYSTOSCOPY WITH URETHRAL BALLOON DILATATION;  Surgeon: Bjorn Loser, MD;  Location: [place];  Service: Urology;  Laterality: N/A;   CYSTOSCOPY WITH URETHRAL DILATATION N/A 03/30/2017   Procedure: CYSTOSCOPY WITH BALLOON URETHRAL DILATATION;  Surgeon: Bjorn Loser, MD;  Location: [place];  Service: Urology;  Laterality: N/A;   CYSTOSCOPY/RETROGRADE/URETEROSCOPY N/A 05/22/2012   Procedure: CYSTOSCOPY BALLOON DILATION RETROGRADE URETEROGRAM ;  Surgeon: Reece Packer, MD;  Location: [place];  Service: Urology;  Laterality: N/A;   CYTSO/  DILATATION URETHRAL STRICTURE/  BX PROSTATIC URETHRA/  REMOVAL FOREIGN BODIES  06-25-2010   [facility]   INGUINAL HERNIA REPAIR Right 2000  MOHS SURGERY  01/ 2019    [facility]   left lower leg for [facility]   RADICAL RESECTION OF SARCOMA TUMOR   03-15-2017   [facility]   RIGHT LOWER LEG , CALF AREA   RADIOACTIVE SEED IMPLANTS, PROSTATE  JAN  2003   SKIN LESION EXCISION  07/2012   [facility]  right shoulder    Allergies  Allergen Reactions   Cefixime Rash   Contrast Media [Iodinated Diagnostic Agents] Other (See Comments)    Avoid due to renal disease   Macrobid [Nitrofurantoin Macrocrystal]    Nitrofurantoin Swelling   Nsaids Other (See Comments)    Has been told no NSAIDs, Ibuprofen Renal insufficiency hx.    Cephalosporins Rash    Outpatient Encounter Medications as of 12/10/2018  Medication Sig   buPROPion (WELLBUTRIN) 100 MG tablet Take 1 tablet by mouth twice daily   cholecalciferol (VITAMIN D) 1000 units tablet Take 2,000 Units by mouth daily.   Coenzyme Q10 (COQ10) 100 MG  CAPS Take 100 mg by mouth daily. Take one daily   Cyanocobalamin-Methylcobalamin 600-600 MCG SUBL Place under the tongue daily.   DIGESTIVE ENZYMES PO Take 500 mg by mouth every morning.    fish oil-omega-3 fatty acids 1000 MG capsule Take 2 g by mouth daily. Name:  Natural Factors Omega-3   lisinopril (ZESTRIL) 10 MG tablet Take 10 mg by mouth every other day.   Multiple Vitamins-Minerals (ICAPS AREDS 2 PO) Take 2 tablets by mouth daily.   NP THYROID 90 MG tablet Take 90 mg by mouth daily.   predniSONE (DELTASONE) 5 MG tablet Take 7.5 mg by mouth daily with breakfast.    RESVERATROL PO Take 100 mg by mouth daily.    TURMERIC CURCUMIN PO Take 2 tablets by mouth daily.    UNABLE TO FIND every other day. Mercie Eon take once daily - Vitamin E   UNABLE TO FIND [place] take once daily   UNABLE TO FIND [place] take 4 tablets once daily   UNABLE TO FIND Comprehensive immune support take 2 tablets once daily   UNABLE TO FIND Modified citrus pectin take 4 tablets once daily   UNABLE TO FIND Celery seed take 2 tablets by mouth once daily   No facility-administered encounter medications on file as of 12/10/2018.     Review of Systems  Constitutional: Negative for appetite change, chills, fatigue and fever.  HENT: Negative for congestion, rhinorrhea, sinus pressure, sinus pain, sneezing and sore throat.   Respiratory: Negative for cough, chest tightness, shortness of breath and wheezing.   Cardiovascular: Positive for leg swelling. Negative for chest pain and palpitations.  Gastrointestinal: Negative for abdominal distention, abdominal pain, constipation, diarrhea, nausea and vomiting.  Musculoskeletal: Negative for arthralgias and gait problem.  Skin: Negative for color change, pallor, rash and wound.  Neurological: Negative for dizziness, weakness, light-headedness, numbness and headaches.  Hematological: Does not bruise/bleed easily.  Psychiatric/Behavioral: Negative  for agitation and sleep disturbance. The patient is not nervous/anxious.     Immunization History  Administered Date(s) Administered   Pneumococcal Conjugate-13 05/06/2015   Pneumococcal Polysaccharide-23 01/18/2003   Td 01/18/2003   Pertinent  Health Maintenance Due  Topic Date Due   INFLUENZA VACCINE  08/18/2018   PNA vac Low Risk Adult  Completed   Fall Risk  12/10/2018 11/08/2018 05/02/2018 12/20/2017 11/01/2017  Falls in the past year? 1 0 0 0 No  Number falls in past yr: 0 - 0 0 -  Injury with Fall? 1 -  0 0 -  Comment - - - - -      Vitals:   12/10/18 1601  BP: (!) 142/82  Pulse: 60  Temp: 98 F (36.7 C)  TempSrc: Temporal  SpO2: 98%  Weight: 167 lb 12.8 oz (76.1 kg)  Height: 5\' 11"  (1.803 m)   Body mass index is 23.4 kg/m. Physical Exam Vitals signs reviewed.  Constitutional:      General: He is not in acute distress.    Appearance: He is normal weight. He is not ill-appearing.  HENT:     Head: Normocephalic.  Eyes:     General: No scleral icterus.       Right eye: No discharge.        Left eye: No discharge.     Conjunctiva/sclera: Conjunctivae normal.     Pupils: Pupils are equal, round, and reactive to light.  Cardiovascular:     Rate and Rhythm: Normal rate and regular rhythm.     Pulses: Normal pulses.     Heart sounds: Normal heart sounds. No murmur. No friction rub. No gallop.   Pulmonary:     Effort: Pulmonary effort is normal. No respiratory distress.     Breath sounds: Normal breath sounds. No wheezing, rhonchi or rales.  Chest:     Chest wall: No tenderness.  Musculoskeletal: Normal range of motion.        General: No swelling or tenderness.     Right lower leg: No edema.     Left lower leg: Edema present.     Comments: Left leg non-pitting edema from foot to thigh area.Non-tender to palpation.Not warm to touch.  Right leg lateral muscle atrophy previous sarcoma site    Skin:    General: Skin is warm and dry.     Coloration: Skin  is not pale.     Findings: No bruising, erythema or rash.     Comments: Dry skin to lower extremities.  Neurological:     Mental Status: He is oriented to person, place, and time.     Cranial Nerves: No cranial nerve deficit.     Sensory: No sensory deficit.     Motor: No weakness.     Gait: Gait normal.  Psychiatric:        Mood and Affect: Mood normal.        Behavior: Behavior normal.        Thought Content: Thought content normal.        Judgment: Judgment normal.    Labs reviewed: No results for input(s): NA, K, CL, CO2, GLUCOSE, BUN, CREATININE, CALCIUM, MG, PHOS in the last 8760 hours. No results for input(s): AST, ALT, ALKPHOS, BILITOT, PROT, ALBUMIN in the last 8760 hours. No results for input(s): WBC, NEUTROABS, HGB, HCT, MCV, PLT in the last 8760 hours. Lab Results  Component Value Date   TSH 3.85 05/03/2018   No results found for: HGBA1C Lab Results  Component Value Date   CHOL 253 (A) 05/03/2018   HDL 79 (A) 05/03/2018   LDLCALC 157 05/03/2018   TRIG 81 05/03/2018    Significant Diagnostic Results in last 30 days:  No results found.  Assessment/Plan 1. Edema of left lower leg Non-pitting edema from foot to thigh area.None tender to palpation or warm to touch.will need to rule out DVT but patient states has upcoming appointment with oncologist 12/11/2018 with plans for Ultrasound tomorrow.Advised to get ultrasound done as directed. Keep leg elevated when seated. Go to ED if having any  chest pain,shortness of breath or symptoms worsen   2. Dry skin dermatitis Apply Aquaphor ointment at least once daily for dry skin.   Family/ staff Communication: Reviewed plan of care with patient and daughter.   Labs/tests ordered: None   Akia Desroches C Keanon Bevins, NP

## 2018-12-10 NOTE — Patient Instructions (Signed)
Please get ultrasound done as directed. Keep leg elevated when seated. Go to ED if having any chest pain,shortness of breath or symptoms worsen

## 2018-12-11 DIAGNOSIS — C499 Malignant neoplasm of connective and soft tissue, unspecified: Secondary | ICD-10-CM | POA: Diagnosis not present

## 2018-12-11 DIAGNOSIS — R918 Other nonspecific abnormal finding of lung field: Secondary | ICD-10-CM | POA: Diagnosis not present

## 2018-12-11 DIAGNOSIS — C4921 Malignant neoplasm of connective and soft tissue of right lower limb, including hip: Secondary | ICD-10-CM | POA: Diagnosis not present

## 2018-12-11 DIAGNOSIS — R21 Rash and other nonspecific skin eruption: Secondary | ICD-10-CM | POA: Diagnosis not present

## 2018-12-11 DIAGNOSIS — R600 Localized edema: Secondary | ICD-10-CM | POA: Diagnosis not present

## 2018-12-11 DIAGNOSIS — R590 Localized enlarged lymph nodes: Secondary | ICD-10-CM | POA: Diagnosis not present

## 2018-12-11 DIAGNOSIS — Z8546 Personal history of malignant neoplasm of prostate: Secondary | ICD-10-CM | POA: Diagnosis not present

## 2018-12-11 DIAGNOSIS — D481 Neoplasm of uncertain behavior of connective and other soft tissue: Secondary | ICD-10-CM | POA: Diagnosis not present

## 2018-12-11 DIAGNOSIS — Z85818 Personal history of malignant neoplasm of other sites of lip, oral cavity, and pharynx: Secondary | ICD-10-CM | POA: Diagnosis not present

## 2018-12-11 DIAGNOSIS — Z923 Personal history of irradiation: Secondary | ICD-10-CM | POA: Diagnosis not present

## 2018-12-11 DIAGNOSIS — Z9889 Other specified postprocedural states: Secondary | ICD-10-CM | POA: Diagnosis not present

## 2018-12-11 DIAGNOSIS — K229 Disease of esophagus, unspecified: Secondary | ICD-10-CM | POA: Diagnosis not present

## 2018-12-19 DIAGNOSIS — M7989 Other specified soft tissue disorders: Secondary | ICD-10-CM | POA: Insufficient documentation

## 2018-12-24 DIAGNOSIS — C441192 Basal cell carcinoma of skin of left lower eyelid, including canthus: Secondary | ICD-10-CM | POA: Diagnosis not present

## 2018-12-25 DIAGNOSIS — Z85828 Personal history of other malignant neoplasm of skin: Secondary | ICD-10-CM | POA: Diagnosis not present

## 2018-12-25 DIAGNOSIS — Z483 Aftercare following surgery for neoplasm: Secondary | ICD-10-CM | POA: Diagnosis not present

## 2018-12-25 DIAGNOSIS — H02115 Cicatricial ectropion of left lower eyelid: Secondary | ICD-10-CM | POA: Diagnosis not present

## 2018-12-25 DIAGNOSIS — Z481 Encounter for planned postprocedural wound closure: Secondary | ICD-10-CM | POA: Diagnosis not present

## 2018-12-25 HISTORY — PX: MOHS SURGERY: SUR867

## 2018-12-27 DIAGNOSIS — N280 Ischemia and infarction of kidney: Secondary | ICD-10-CM | POA: Diagnosis not present

## 2019-01-09 DIAGNOSIS — S0181XS Laceration without foreign body of other part of head, sequela: Secondary | ICD-10-CM | POA: Diagnosis not present

## 2019-01-09 DIAGNOSIS — C441192 Basal cell carcinoma of skin of left lower eyelid, including canthus: Secondary | ICD-10-CM | POA: Diagnosis not present

## 2019-01-09 DIAGNOSIS — Z09 Encounter for follow-up examination after completed treatment for conditions other than malignant neoplasm: Secondary | ICD-10-CM | POA: Diagnosis not present

## 2019-01-30 ENCOUNTER — Other Ambulatory Visit: Payer: Self-pay

## 2019-01-30 ENCOUNTER — Telehealth: Payer: Self-pay | Admitting: Internal Medicine

## 2019-01-30 ENCOUNTER — Encounter: Payer: Self-pay | Admitting: Internal Medicine

## 2019-01-30 ENCOUNTER — Non-Acute Institutional Stay: Payer: Medicare Other | Admitting: Internal Medicine

## 2019-01-30 VITALS — BP 140/86 | HR 72 | Temp 97.9°F | Ht 71.0 in | Wt 164.0 lb

## 2019-01-30 DIAGNOSIS — L853 Xerosis cutis: Secondary | ICD-10-CM

## 2019-01-30 DIAGNOSIS — E034 Atrophy of thyroid (acquired): Secondary | ICD-10-CM

## 2019-01-30 DIAGNOSIS — Z85831 Personal history of malignant neoplasm of soft tissue: Secondary | ICD-10-CM

## 2019-01-30 DIAGNOSIS — R600 Localized edema: Secondary | ICD-10-CM | POA: Diagnosis not present

## 2019-01-30 DIAGNOSIS — Z8546 Personal history of malignant neoplasm of prostate: Secondary | ICD-10-CM | POA: Diagnosis not present

## 2019-01-30 DIAGNOSIS — N1831 Chronic kidney disease, stage 3a: Secondary | ICD-10-CM

## 2019-01-30 DIAGNOSIS — R5382 Chronic fatigue, unspecified: Secondary | ICD-10-CM | POA: Diagnosis not present

## 2019-01-30 NOTE — Telephone Encounter (Signed)
CMA spoke with [facility] and got contact information for two lymphedema management clinic sites:   [facility] [phone] and [facility]  Lattie Haw, can you please do a referral for Jamy to one of these sites for his left leg swelling?  There is no electronic option for this in epic that I can locate.  Thanks  Johnattan Strassman L. Lyndle Pang, D.O. [facility] [address], [place] [address] Cell Phone (Mon-Fri 8am-5pm):  [phone] On Call:  [phone] & follow prompts after 5pm & weekends Office Phone:  [phone] Office Fax:  [phone]

## 2019-01-30 NOTE — Progress Notes (Signed)
Location:  [place] of Service:  Clinic (12)  Provider: Scotti Kosta L. Mariea Clonts, D.O., C.M.D.  Code Status: DNR Goals of Care:  Advanced Directives 01/30/2019  Does Patient Have a Medical Advance Directive? Yes  Type of Advance Directive Living will;Out of facility DNR (pink MOST or yellow form)  Does patient want to make changes to medical advance directive? No - Patient declined  Copy of [place] in Chart? -  Would patient like information on creating a medical advance directive? -  Pre-existing out of facility DNR order (yellow form or pink MOST form) Pink MOST form placed in chart (order not valid for inpatient use)   Chief Complaint  Patient presents with  . Acute Visit    Left leg swelling  since , patient would like to talk about the 3 scans done at [place]     HPI: Patient is a 86 y.o. male seen today for an acute visit for left leg swelling and increased fatigue.  He's had some swelling of the left entire leg "since November."  It was getting worse.  He saw vascular surgery and had a venous duplex of his left leg which was negative for thrombosis.  He did have a small thrombus in his renal artery which was not felt to require intervention.  He then was sent for a noncontrast and contrast CT of abdomen/pelvis which also did not reveal an explanation for his left leg swelling which is from groin to toes.  He has some tenderness moreso in his left foot/ankle area, but minimal.  Not bothered by leg when walking.  No sarcoma seen on CT and no aortitis mentioned either.  He is wanting to see a lymphedema specialist.  Of note, he's not been consistently wearing the left leg compression sock.    He also again mentions dry skin of the lower leg and his several areas of skin concern that require derm f/u but he's not made the appt yet.     He also notes he's more fatigued and can barely make it up the hill when walking around [place]--he requests  labwork for this.  He remains nonadherent with cpap and will not take levothyroxine, takes an alternative to it on his own along with numerous other herbal remedies.    Past Medical History:  Diagnosis Date  . Aneurysm of left renal artery (HCC)    per CT 08-20-2015 stable 20mm  . 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 Basal cell carcinoma   . Bladder neck contracture   . BPH (benign prostatic hyperplasia)   . CKD (chronic kidney disease), stage III   . Complication of anesthesia    post op acute urinary retention  . DDD (degenerative disc disease)   . Foley catheter in place    placed 02-29-2019 secondary to urinary retention post surgery  . Full dentures   . GERD (gastroesophageal reflux disease)   . History of external beam radiation therapy 2002   prostate cancer  and boost with radioative prostate seed implants  . History of prostate cancer DX  2002   S/P EXTERNAL RADIATION/ RADIOACTIVE SEED IMPLANTS  2003  . History of squamous cell carcinoma excision    2012--  RIGHT LOWER EXTREM;  2019 LEFT LOWER EXTREMITIY  . HTN (hypertension)   . Hyperlipemia   . Hypothyroidism    endocrinologist-  dr Chalmers Cater--- per pt takes his own  thyroid med. called NP Thyroid 10 mg daily, does not take synthroid  . Large vessel vasculitis (HCC)    aortitis--- rheumotologist-  dr Charlestine Night  . Left renal artery stenosis (HCC)    proximal per CT 08-20-2015 but patent  . Left renal atrophy   . Macular degeneration   . Nocturia   . OSA on CPAP    pt retested 11-13-2016 at [facility] w/ dr ather-- moderate to severe OSA , AHI 16.4/h/  03-24-2017 per pt is consistant using every night  . Pulmonary nodule    per CT 02-28-2017 in [place] in epic done at [place],  LUL nodule , not mets  . Renal cyst, right   . Restless legs syndrome (RLS) 10/31/2011  . S/P radiation therapy     for tonsillar cancer (head and neck)  completed 01-26-2012 at [facility])  . Saliva decreased   . Sarcoma of lower extremity, right ([place]) oncolgoist-  dr Juluis Rainier ([place])   dx 02-14-2017 w/ needle core bx;  high grade pleomorphic spindle cell sarcoma , grade 2 (cT2N0M0); 03-15-2017  radical resection sarcoma tumor right lower leg  and plan radiation  . Self-catheterizes urinary bladder    QID   AND   PRN  . 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 Urethral stricture    chronic---  post urethral dilation's  . Urinary retention with incomplete bladder emptying   . Vitamin B 12 deficiency 01/28/2011  . Vitamin D deficiency     Past Surgical History:  Procedure Laterality Date  . BALLOON DILATION N/A 11/10/2014   Procedure: CYSTO BALLOON DILATION AND RETROGRADE URETHROGRAM ;  Surgeon: Bjorn Loser, MD;  Location[facility];  Service: Urology;  Laterality: N/A;  . CATARACT EXTRACTION W/ INTRAOCULAR LENS  IMPLANT, BILATERAL    . CYSTO/ BALLOON DILATION OF  URETHRAL STRICTURE  12-25-2010  . CYSTOSCOPY WITH RETROGRADE URETHROGRAM N/A 03/30/2016   Procedure: CYSTOSCOPY WITH RETROGRADE URETHROGRAM AND BALLOON DILATION with cystogram;  Surgeon: Bjorn Loser, MD;  Location: [facility];  Service: Urology;  Laterality: N/A;  . CYSTOSCOPY WITH URETHRAL DILATATION  05/31/2011   Procedure: CYSTOSCOPY WITH URETHRAL DILATATION;  Surgeon: Reece Packer, MD;  Location: [place];  Service: Urology;  Laterality: N/A;  BALLOON DILATION  . CYSTOSCOPY WITH URETHRAL DILATATION N/A 09/13/2016   Procedure: CYSTOSCOPY WITH URETHRAL BALLOON DILATATION;  Surgeon: Bjorn Loser, MD;  Location: [place];   Service: Urology;  Laterality: N/A;  . CYSTOSCOPY WITH URETHRAL DILATATION N/A 03/30/2017   Procedure: CYSTOSCOPY WITH BALLOON URETHRAL DILATATION;  Surgeon: Bjorn Loser, MD;  Location: [place];  Service: Urology;  Laterality: N/A;  . CYSTOSCOPY/RETROGRADE/URETEROSCOPY N/A 05/22/2012   Procedure: CYSTOSCOPY BALLOON DILATION RETROGRADE URETEROGRAM ;  Surgeon: Reece Packer, MD;  Location: [place];  Service: Urology;  Laterality: N/A;  . CYTSO/  DILATATION URETHRAL STRICTURE/  BX PROSTATIC URETHRA/  REMOVAL FOREIGN BODIES  06-25-2010   [facility]  . INGUINAL HERNIA REPAIR Right 2000  . MOHS SURGERY  01/ 2019    [facility]   left lower leg for SCC  . RADICAL RESECTION OF SARCOMA TUMOR   03-15-2017   [facility]  RIGHT LOWER LEG , CALF AREA  . RADIOACTIVE SEED IMPLANTS, PROSTATE  JAN  2003  . SKIN LESION EXCISION  07/2012   [facility]  right shoulder    Allergies  Allergen Reactions  . Cefixime Rash  . Contrast Media [Iodinated Diagnostic Agents] Other (See Comments)    Avoid due to renal disease  . Macrobid [Nitrofurantoin Macrocrystal]   . Nitrofurantoin Swelling  . Nsaids Other (See Comments)    Has been told no NSAIDs, Ibuprofen Renal insufficiency hx.   . Cephalosporins Rash    Outpatient Encounter Medications as of 01/30/2019  Medication Sig  . buPROPion (WELLBUTRIN) 100 MG tablet Take 1 tablet by mouth twice daily  . cholecalciferol (VITAMIN D) 1000 units tablet Take 2,000 Units by mouth daily.  . Coenzyme Q10 (COQ10) 100 MG CAPS Take 100 mg by mouth daily. Take one daily  . Cyanocobalamin-Methylcobalamin 600-600 MCG SUBL Place under the tongue daily.  Marland Kitchen DIGESTIVE ENZYMES PO Take 500 mg by mouth every morning.   . fish oil-omega-3 fatty acids 1000 MG capsule Take 2 g by mouth daily. Name:  Natural Factors Omega-3  . lisinopril (ZESTRIL) 10 MG tablet Take 10 mg by mouth every other day.  . Multiple Vitamins-Minerals (ICAPS AREDS 2 PO) Take 2 tablets by  mouth daily.  . NP THYROID 90 MG tablet Take 90 mg by mouth daily.  . predniSONE (DELTASONE) 5 MG tablet Take 7.5 mg by mouth daily with breakfast.   . RESVERATROL PO Take 100 mg by mouth daily.   . TURMERIC CURCUMIN PO Take 2 tablets by mouth daily.   Marland Kitchen UNABLE TO FIND every other day. Mercie Eon take once daily - Vitamin E  . UNABLE TO FIND [place] take once daily  . UNABLE TO FIND [place] take 4 tablets once daily  . UNABLE TO FIND Comprehensive immune support take 2 tablets once daily  . UNABLE TO FIND Modified citrus pectin take 4 tablets once daily  . UNABLE TO FIND Celery seed take 2 tablets by mouth once daily   No facility-administered encounter medications on file as of 01/30/2019.    Review of Systems:  Review of Systems  Constitutional: Positive for malaise/fatigue. Negative for chills and fever.  HENT: Negative for congestion, hearing loss and sore throat.   Eyes: Negative for blurred vision.  Respiratory: Positive for shortness of breath. Negative for cough and wheezing.   Cardiovascular: Positive for leg swelling. Negative for chest pain, palpitations, orthopnea and PND.       Only left leg but from groin to toes  Gastrointestinal: Negative for abdominal pain and diarrhea.  Genitourinary: Negative for dysuria.  Musculoskeletal: Negative for falls and joint pain.  Skin: Negative for itching and rash.       Dry scaly skin of legs  Neurological: Negative for dizziness, tingling, sensory change, speech change, focal weakness and loss of consciousness.  Endo/Heme/Allergies: Does not bruise/bleed easily.  Psychiatric/Behavioral: Positive for depression. Negative for memory loss. The patient is not nervous/anxious.     Health Maintenance  Topic Date Due  . TETANUS/TDAP  01/17/2013  . INFLUENZA VACCINE  08/18/2018  . PNA vac Low Risk Adult  Completed    Physical Exam: Vitals:   01/30/19 0935  BP: 140/86  Pulse: 72  Temp: 97.9 F (36.6 C)  SpO2: 97%   Weight: 164 lb (74.4 kg)  Height: 5\' 11"  (1.803 m)   Body mass index is 22.87 kg/m. Physical Exam Vitals reviewed.  Constitutional:  General: He is not in acute distress.    Appearance: Normal appearance. He is not ill-appearing or toxic-appearing.  Cardiovascular:     Rate and Rhythm: Normal rate and regular rhythm.     Pulses: Normal pulses.     Heart sounds: Normal heart sounds.  Pulmonary:     Effort: Pulmonary effort is normal.     Breath sounds: Normal breath sounds. No wheezing, rhonchi or rales.  Musculoskeletal:        General: Normal range of motion.     Right lower leg: No edema.     Left lower leg: Edema present.  Skin:    General: Skin is warm and dry.     Capillary Refill: Capillary refill takes less than 2 seconds.     Comments: Slight erythema of left leg and diffuse nonpitting edema from groin to toes, slight tenderness of shin and foot  Neurological:     General: No focal deficit present.     Mental Status: He is alert and oriented to person, place, and time.     Labs reviewed: Basic Metabolic Panel: Recent Labs    05/03/18 0700  TSH 3.85   Liver Function Tests: No results for input(s): AST, ALT, ALKPHOS, BILITOT, PROT, ALBUMIN in the last 8760 hours. No results for input(s): LIPASE, AMYLASE in the last 8760 hours. No results for input(s): AMMONIA in the last 8760 hours. CBC: No results for input(s): WBC, NEUTROABS, HGB, HCT, MCV, PLT in the last 8760 hours. Lipid Panel: Recent Labs    05/03/18 0000  CHOL 253*  HDL 79*  LDLCALC 157  TRIG 81   No results found for: HGBA1C  Procedures since last visit: Reviewed prior imaging in care everywhere (venous duplex and CTA abd/pelvis w/ and w/o contrast)  Assessment/Plan 1. Edema of left lower leg - consistently use compression stocking for entire left day on in am and off at hs as advised by vascular surgery at [facility] -elevate at rest - Ambulatory referral to Physical Therapy  2. Dry  skin dermatitis -apply cerave cream after showers daily  3. History of sarcoma - with prior excision on right leg and XRT  4. Personal history of prostate cancer -with external beam radiation and brachytherapy in the past -now with swelling left leg without findings on venous duplex or CTA abd/pelvis - Ambulatory referral to Physical Therapy  5. Chronic fatigue -worse recently with some increasing dyspnea [place] -will check lab panel:  Cbc with diff, cmp, b12, iron panel, tsh (does take thyroid med) and vitamin D3  6. Stage 3a chronic kidney disease -f/u cmp; Avoid nephrotoxic agents like nsaids, dose adjust renally excreted meds, hydrate.  7. Hypothyroidism due to acquired atrophy of thyroid -needs f/u tsh for sure with his recent increased fatigue  Labs/tests ordered:  Cbc with diff, cmp, b12, iron panel and 25-OH Vit D and TSH next draw at [facility]; referred to PT for lymphedema LLE--appears this may need to be done through the cancer center--CMA investigating but referral was placed to therapy  Next appt:  03/20/2019 f/u on leg swelling  Shylyn Younce L. Krystie Leiter, D.O. [facility] [address], [place] [address] Cell Phone (Mon-Fri 8am-5pm):  [phone] On Call:  [phone] & follow prompts after 5pm & weekends Office Phone:  [phone] Office Fax:  [phone]

## 2019-01-30 NOTE — Patient Instructions (Signed)
Be sure to use the cerave cream on your dry skin areas. Wear your left compression sock all day and take off at bedtime I will refer you to [facility] therapy lymphedema clinic for evaluation.

## 2019-01-30 NOTE — Addendum Note (Signed)
Addended by: Gayland Curry on: 01/30/2019 05:15 PM   Modules accepted: Orders

## 2019-01-31 DIAGNOSIS — E039 Hypothyroidism, unspecified: Secondary | ICD-10-CM | POA: Diagnosis not present

## 2019-01-31 DIAGNOSIS — R5381 Other malaise: Secondary | ICD-10-CM | POA: Diagnosis not present

## 2019-01-31 DIAGNOSIS — D519 Vitamin B12 deficiency anemia, unspecified: Secondary | ICD-10-CM | POA: Diagnosis not present

## 2019-01-31 DIAGNOSIS — I10 Essential (primary) hypertension: Secondary | ICD-10-CM | POA: Diagnosis not present

## 2019-01-31 DIAGNOSIS — D649 Anemia, unspecified: Secondary | ICD-10-CM | POA: Diagnosis not present

## 2019-01-31 DIAGNOSIS — Z23 Encounter for immunization: Secondary | ICD-10-CM | POA: Diagnosis not present

## 2019-01-31 DIAGNOSIS — E559 Vitamin D deficiency, unspecified: Secondary | ICD-10-CM | POA: Diagnosis not present

## 2019-01-31 DIAGNOSIS — E538 Deficiency of other specified B group vitamins: Secondary | ICD-10-CM | POA: Diagnosis not present

## 2019-01-31 LAB — CBC AND DIFFERENTIAL
HCT: 41 (ref 41–53)
Hemoglobin: 14 (ref 13.5–17.5)
Platelets: 233 (ref 150–399)
WBC: 6.2

## 2019-01-31 LAB — BASIC METABOLIC PANEL
BUN: 25 — AB (ref 4–21)
CO2: 30 — AB (ref 13–22)
Chloride: 102 (ref 99–108)
Creatinine: 1.5 — AB (ref 0.6–1.3)
Glucose: 97
Potassium: 4.7 (ref 3.4–5.3)
Sodium: 141 (ref 137–147)

## 2019-01-31 LAB — IRON,TIBC AND FERRITIN PANEL
Iron: 129
TIBC: 242
UIBC: 113

## 2019-01-31 LAB — COMPREHENSIVE METABOLIC PANEL
Albumin: 4.2 (ref 3.5–5.0)
Calcium: 9.2 (ref 8.7–10.7)
Globulin: 2.3

## 2019-01-31 LAB — VITAMIN D 25 HYDROXY (VIT D DEFICIENCY, FRACTURES): Vit D, 25-Hydroxy: 60

## 2019-01-31 LAB — CBC: RBC: 4.28 (ref 3.87–5.11)

## 2019-01-31 LAB — VITAMIN B12: Vitamin B-12: 230

## 2019-01-31 LAB — TSH: TSH: 4.21 (ref 0.41–5.90)

## 2019-01-31 NOTE — Telephone Encounter (Signed)
Spoke with pt & he has sch appt with [facility] [address] 02/01/19 at [place].  Thanks, Vilinda Blanks

## 2019-01-31 NOTE — Telephone Encounter (Signed)
Great - thanks

## 2019-02-01 ENCOUNTER — Encounter: Payer: Self-pay | Admitting: Internal Medicine

## 2019-02-01 ENCOUNTER — Other Ambulatory Visit: Payer: Self-pay

## 2019-02-01 ENCOUNTER — Telehealth: Payer: Self-pay | Admitting: Internal Medicine

## 2019-02-01 ENCOUNTER — Ambulatory Visit: Payer: Medicare Other | Attending: Internal Medicine

## 2019-02-01 DIAGNOSIS — I890 Lymphedema, not elsewhere classified: Secondary | ICD-10-CM | POA: Diagnosis not present

## 2019-02-01 DIAGNOSIS — R2689 Other abnormalities of gait and mobility: Secondary | ICD-10-CM | POA: Diagnosis not present

## 2019-02-01 LAB — VITAMIN B12: Vitamin B-12: 230

## 2019-02-01 IMAGING — US US RENAL ARTERY STENOSIS
1 series · 13 of 25 positions shown · non-contrast
Comparison: None.

CLINICAL DATA: 85-year-old male with a history chronic kidney
disease

EXAM:
RENAL/URINARY TRACT ULTRASOUND
RENAL DUPLEX ULTRASOUND

[Series 1: us renal artery stenosis · 0.23mm/px · 13 of 87 slices shown]
[im 1/87]
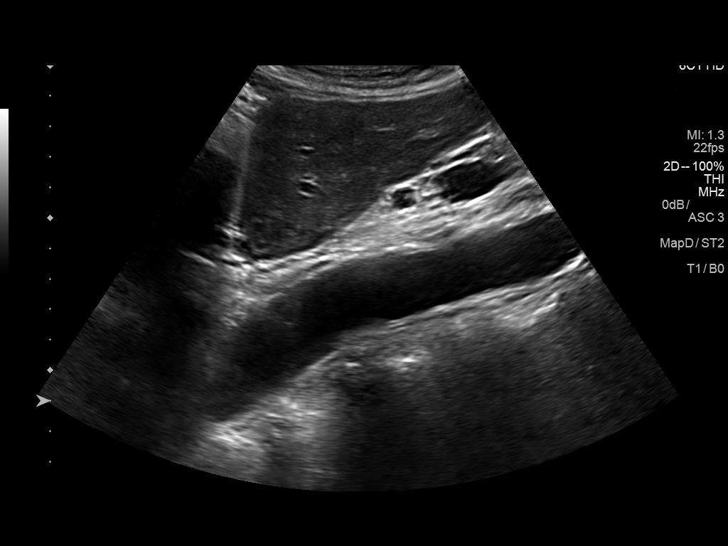
[im 8/87]
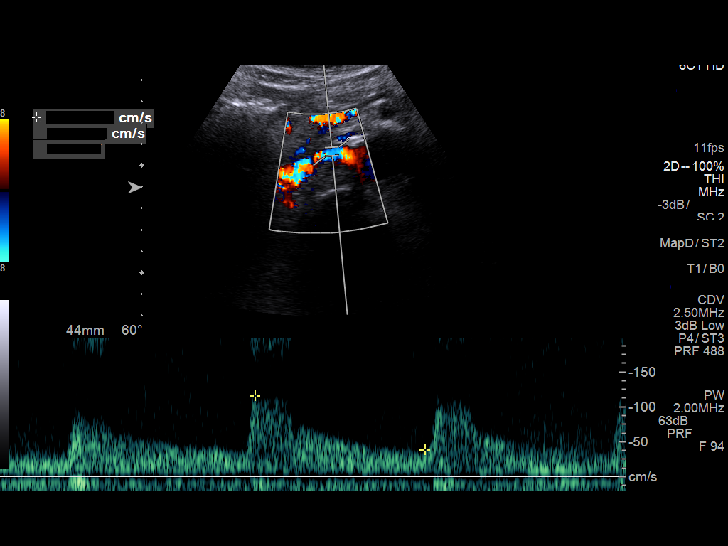
[im 15/87]
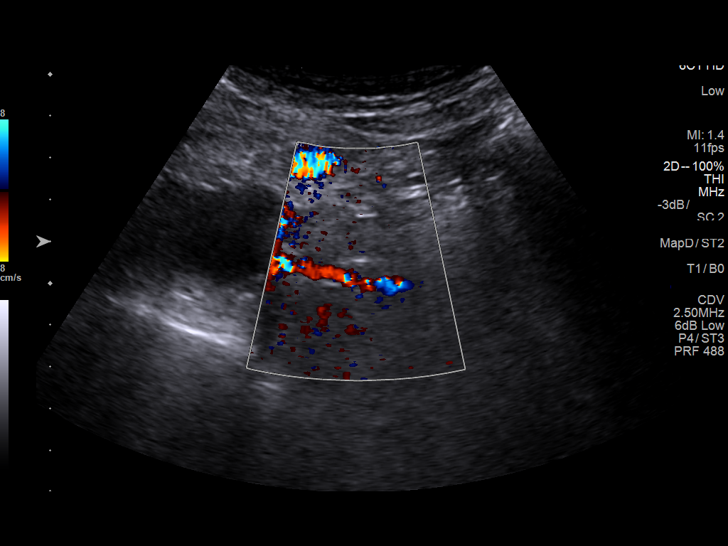
[im 22/87]
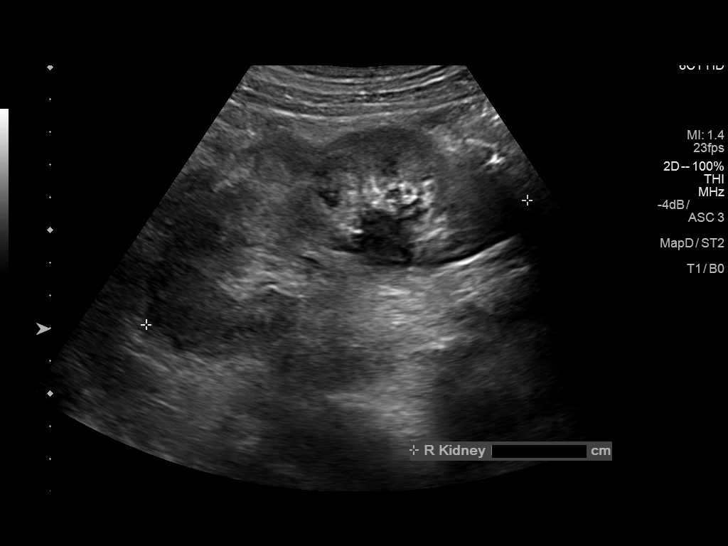
[im 29/87]
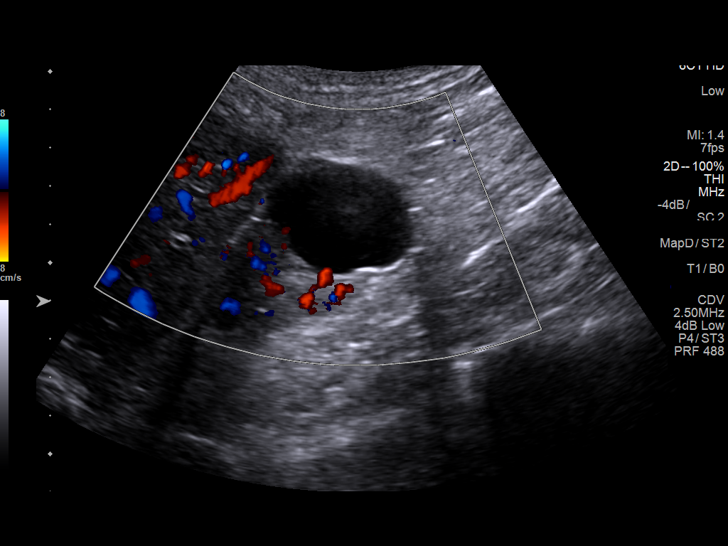
[im 36/87]
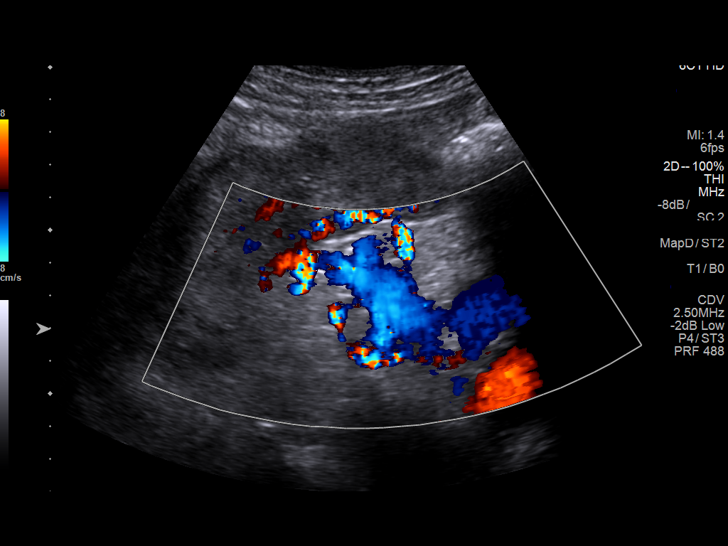
[im 44/87]
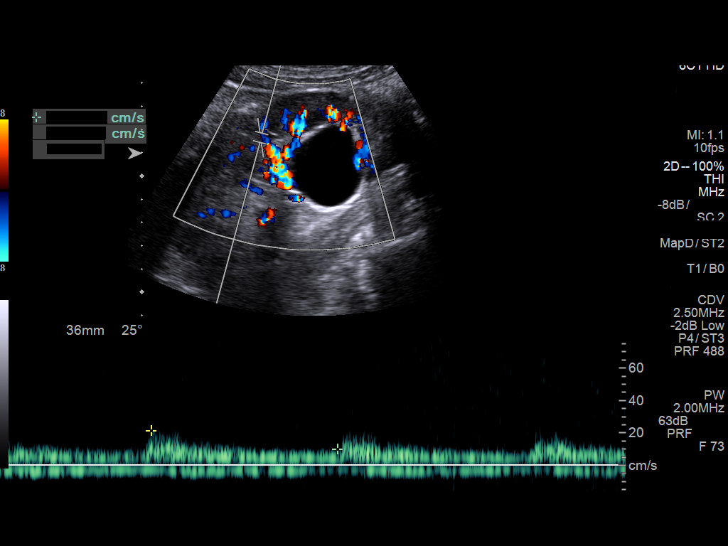
[im 51/87]
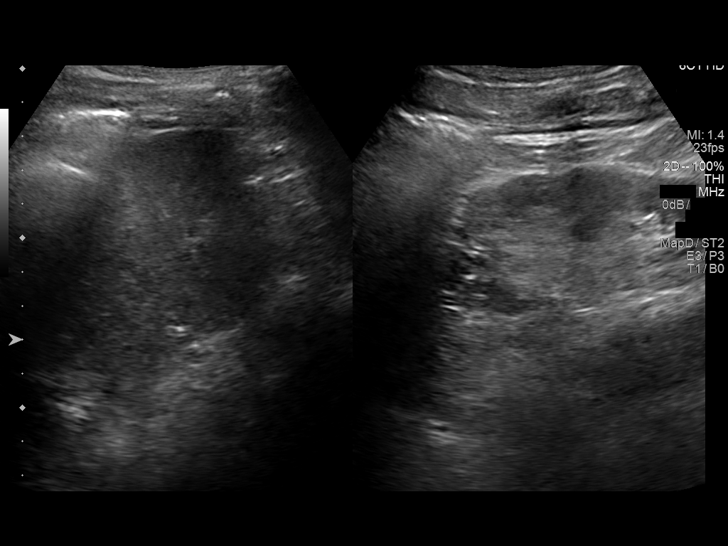
[im 58/87]
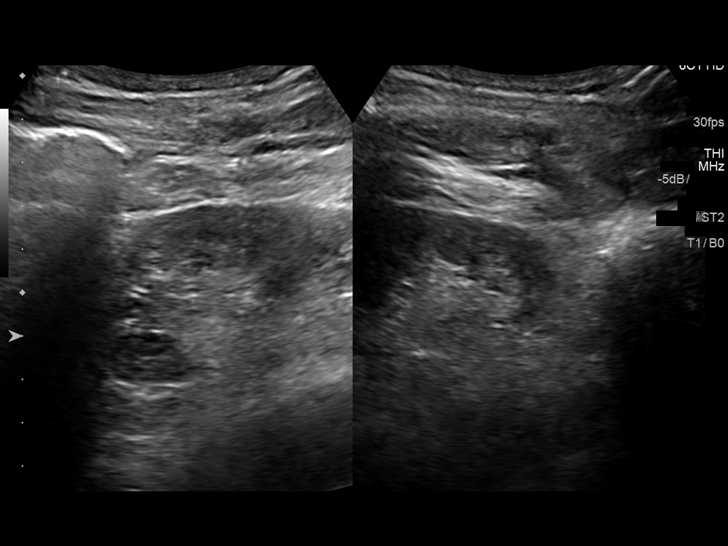
[im 65/87]
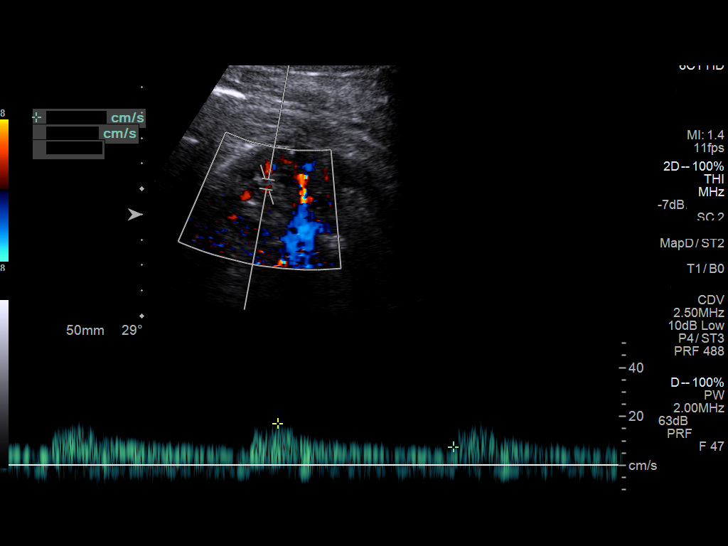
[im 72/87]
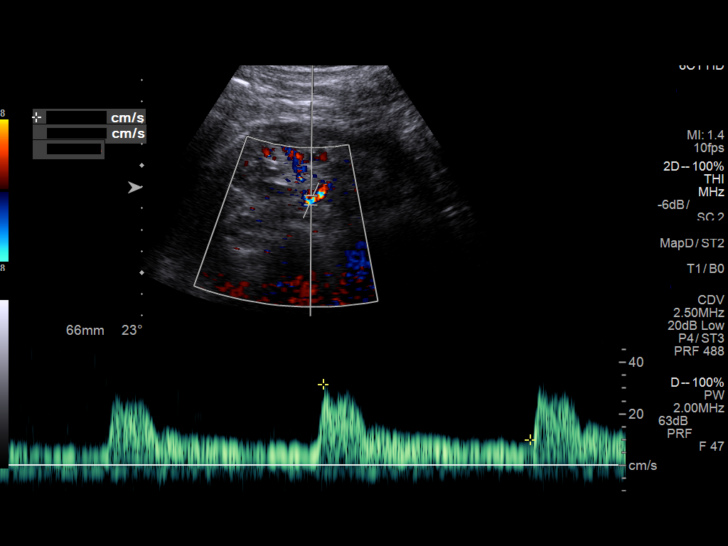
[im 79/87]
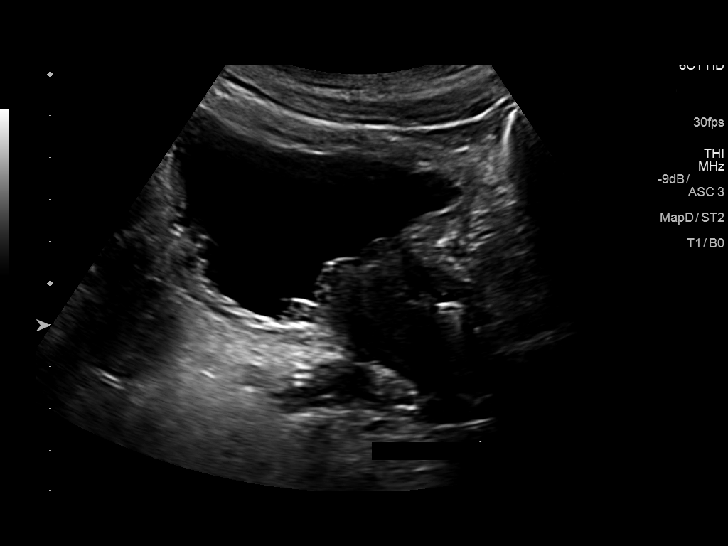
[im 87/87]
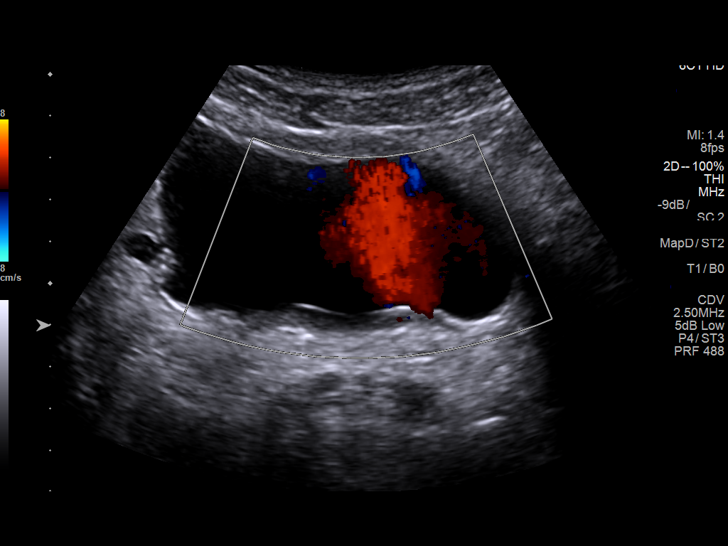

[13 of 25 positions shown; findings below may reference images not displayed]

FINDINGS: Right Kidney:

Length: 12.3 cm. Increased echogenicity of the right kidney compared
to the adjacent liver. Flow confirmed in the hilum of the right
kidney. Parapelvic cyst with no evidence of hydronephrosis. Cyst
measures 3.2 cm x 3.6 cm x 3.8 cm with no internal complexity, flow,
and with through transmission. There is an additional cystic lesion
of the right kidney measuring 3.9 cm x 2.7 cm x 3.2 cm with no
internal complexity or flow and with through transmission.

Left Kidney:

Length: 8.5 cm. Increased echogenicity of the left kidney. No
hydronephrosis.

Bladder: Unremarkable

RENAL DUPLEX ULTRASOUND

Right Renal Artery Velocities:

Origin:  116 cm/sec

Mid:  130 cm/sec

Hilum:  96 cm/sec

Interlobar:  43 cm/sec

Arcuate:  26 cm/sec

Left Renal Artery Velocities:

Origin:  80 cm/sec

Mid:  91 cm/sec

Hilum:  32 cm/sec

Interlobar:  2 cm/sec

Arcuate:  21 cm/sec

Aortic Velocity:  77 cm/sec

Right Renal-Aortic Ratios:

Origin:

Mid:

Hilum:

Interlobar:

Arcuate:

Left Renal-Aortic Ratios:

Origin:

Mid:

Hilum:

Interlobar:

Arcuate:
IMPRESSION: Mildly increased echogenicity of the bilateral kidneys, can be seen
with chronic medical renal disease.

Right-sided Bosniak 1 cysts.

Directed duplex demonstrates no evidence renal artery stenosis.

## 2019-02-01 NOTE — Therapy (Signed)
[place], [place], [address] Phone: [phone]   Fax:  [phone]  Physical Therapy Evaluation  Patient Details  Name: Philip [name] MRN: [id number] Date of Birth: 08/08/1932 Referring Provider (PT): Hollace Kinnier DO   Encounter Date: 02/01/2019  PT End of Session - 02/01/19 0959    Visit Number  1    Number of Visits  9    Date for PT Re-Evaluation  03/08/19    PT Start Time  0801    PT Stop Time  0850    PT Time Calculation (min)  49 min    Activity Tolerance  Patient tolerated treatment well    Behavior During Therapy  [facility] for tasks assessed/performed       Past Medical History:  Diagnosis Date  . Aneurysm of left renal artery (HCC)    per CT 08-20-2015 stable 3mm  . 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 Basal cell carcinoma   . Bladder neck contracture   . BPH (benign prostatic hyperplasia)   . CKD (chronic kidney disease), stage III   . Complication of anesthesia    post op acute urinary retention  . DDD (degenerative disc disease)   . Foley catheter in place    placed 02-29-2019 secondary to urinary retention post surgery  . Full dentures   . GERD (gastroesophageal reflux disease)   . History of external beam radiation therapy 2002   prostate cancer  and boost with radioative prostate seed implants  . History of prostate cancer DX  2002   S/P EXTERNAL RADIATION/ RADIOACTIVE SEED IMPLANTS  2003  . History of squamous cell carcinoma excision    2012--  RIGHT LOWER EXTREM;  2019 LEFT LOWER EXTREMITIY  . HTN (hypertension)   . Hyperlipemia   . Hypothyroidism    endocrinologist-  dr Chalmers Cater--- per pt takes his own thyroid med. called NP Thyroid 10 mg daily, does not take synthroid  . Large vessel vasculitis (HCC)    aortitis--- rheumotologist-  dr Charlestine Night  . Left renal artery stenosis (HCC)    proximal per  CT 08-20-2015 but patent  . Left renal atrophy   . Macular degeneration   . Nocturia   . OSA on CPAP    pt retested 11-13-2016 at [facility] w/ dr ather-- moderate to severe OSA , AHI 16.4/h/  03-24-2017 per pt is consistant using every night  . Pulmonary nodule    per CT 02-28-2017 in [place] in epic done at [facility],  LUL nodule , not mets  . Renal cyst, right   . Restless legs syndrome (RLS) 10/31/2011  . S/P radiation therapy     for tonsillar cancer (head and neck) completed 01-26-2012 at [facility])  . Saliva decreased   . Sarcoma of lower extremity, right ([place]) oncolgoist-  dr Juluis Rainier ([place])   dx 02-14-2017 w/ needle core bx;  high grade pleomorphic spindle cell sarcoma , grade 2 (cT2N0M0); 03-15-2017  radical resection sarcoma tumor right lower leg  and plan radiation  . Self-catheterizes urinary bladder    QID   AND   PRN  . 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 Urethral stricture    chronic---  post urethral dilation's  . Urinary retention with incomplete bladder emptying   . Vitamin B 12 deficiency 01/28/2011  . Vitamin D deficiency     Past Surgical History:  Procedure Laterality Date  . BALLOON DILATION N/A 11/10/2014   Procedure: CYSTO BALLOON DILATION AND RETROGRADE URETHROGRAM ;  Surgeon: Bjorn Loser, MD;  Location[facility];  Service: Urology;  Laterality: N/A;  . CATARACT EXTRACTION W/ INTRAOCULAR LENS  IMPLANT, BILATERAL    . CYSTO/ BALLOON DILATION OF  URETHRAL STRICTURE  12-25-2010  . CYSTOSCOPY WITH RETROGRADE URETHROGRAM N/A 03/30/2016   Procedure: CYSTOSCOPY WITH RETROGRADE URETHROGRAM AND BALLOON DILATION with cystogram;  Surgeon: Bjorn Loser, MD;   Location: [facility];  Service: Urology;  Laterality: N/A;  . CYSTOSCOPY WITH URETHRAL DILATATION  05/31/2011   Procedure: CYSTOSCOPY WITH URETHRAL DILATATION;  Surgeon: Reece Packer, MD;  Location: [place];  Service: Urology;  Laterality: N/A;  BALLOON DILATION  . CYSTOSCOPY WITH URETHRAL DILATATION N/A 09/13/2016   Procedure: CYSTOSCOPY WITH URETHRAL BALLOON DILATATION;  Surgeon: Bjorn Loser, MD;  Location: [place];  Service: Urology;  Laterality: N/A;  . CYSTOSCOPY WITH URETHRAL DILATATION N/A 03/30/2017   Procedure: CYSTOSCOPY WITH BALLOON URETHRAL DILATATION;  Surgeon: Bjorn Loser, MD;  Location: [place];  Service: Urology;  Laterality: N/A;  . CYSTOSCOPY/RETROGRADE/URETEROSCOPY N/A 05/22/2012   Procedure: CYSTOSCOPY BALLOON DILATION RETROGRADE URETEROGRAM ;  Surgeon: Reece Packer, MD;  Location: [place];  Service: Urology;  Laterality: N/A;  . CYTSO/  DILATATION URETHRAL STRICTURE/  BX PROSTATIC URETHRA/  REMOVAL FOREIGN BODIES  06-25-2010   [facility]  . INGUINAL HERNIA REPAIR Right 2000  . MOHS SURGERY  01/ 2019    [facility]   left lower leg for SCC  . RADICAL RESECTION OF SARCOMA TUMOR   03-15-2017   [facility]   RIGHT LOWER LEG , CALF AREA  . RADIOACTIVE SEED IMPLANTS, PROSTATE  JAN  2003  . SKIN LESION EXCISION  07/2012   MOST  right shoulder    There were no vitals filed for this visit.   Subjective Assessment - 02/01/19 0806    Subjective  Pt reports that he had a sarcome in his R calf and when he saw his MD around the end of September he noticed a little bit of swelling in his R calf. He states he asked his MD about it and he did not seem concerned at the time. Pt states that he does have a thigh high compression garment that he wore for a couple of days but then stopped wearing it. He has recently started wearing it again. He states that he has a foam wedge that he props his leg up at  night and the first night his leg went down 1 inch in the thigh but since that time it has stayed about the same.    Pertinent History  Hx Prostate cancer and R lower leg sarcoma    How long can you walk comfortably?  Pt states that his legs get achy and he feels winded after walking about 40 minutes    Diagnostic tests  MRI, CT scan and doppler without significant findings.    Patient Stated Goals  I want to get this swelling down and work on my endurance.    Currently in Pain?  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   Overall Cognitive Status  Within Functional Limits for tasks assessed      ROM / Strength   AROM / PROM / Strength  Strength      Strength   Strength Assessment Site  Hip;Knee;Ankle    Right/Left Hip  Right;Left    Right Hip Flexion  5/5    Right Hip ABduction  5/5    Right Hip ADduction  5/5    Left Hip Flexion  5/5    Left Hip ABduction  5/5    Left Hip ADduction  5/5    Right/Left Knee  Right;Left    Right Knee Flexion  5/5    Right Knee Extension  5/5    Left Knee Flexion  5/5    Left Knee Extension  5/5    Right/Left Ankle  Right;Left    Right Ankle Dorsiflexion  3+/5    Left Ankle Dorsiflexion  5/5        LYMPHEDEMA/ONCOLOGY QUESTIONNAIRE - 02/01/19 0836      Type   Cancer Type  Hx prostate  and sarcoma on the R lower leg      Treatment   Past Chemotherapy Treatment  Yes    Past Radiation Treatment  Yes      What other symptoms do you have   Are you Having Heaviness or Tightness  Yes    Are you having Pain  No    Are you having pitting edema  Yes    Body Site  L ankle    Is it Hard or Difficult finding clothes that fit  No    Do you have infections  No    Is there Decreased scar mobility  No      Lymphedema Stage   Stage  STAGE 1 SPONTANEOUSLY REVERSIBLE      Lymphedema Assessments   Lymphedema Assessments  Lower extremities      Right Lower Extremity Lymphedema   30 cm Proximal to Suprapatella  47 cm    20 cm Proximal to Suprapatella  40.7 cm    10 cm Proximal to Suprapatella  36 cm    At Midpatella/Popliteal Crease  37.9 cm    30 cm Proximal to Floor at Lateral Plantar Foot  29 cm    20 cm Proximal to Floor at Lateral Plantar Foot  23 1    10  cm Proximal to Floor at Lateral Malleoli  23 cm    5 cm Proximal to 1st MTP Joint  23 cm    Around Proximal Great Toe  8.4 cm  Left Lower Extremity Lymphedema   30 cm Proximal to Suprapatella  52.7 cm    20 cm Proximal to Suprapatella  48.5 cm    10 cm Proximal to Suprapatella  44.3 cm    At Midpatella/Popliteal Crease  40.3 cm    30 cm Proximal to Floor at Lateral Plantar Foot  34 cm    20 cm Proximal to Floor at Lateral Plantar Foot  26 cm    10 cm Proximal to Floor at Lateral Malleoli  27.3 cm    5 cm Proximal to 1st MTP Joint  23.5 cm    Around Proximal Great Toe  9 cm             Outpatient Re[facility]/01/[facility]  Lymphedema Life Impact Scale Total Score  17.65 %      Objective measurements completed on examination: See above findings.      [place] Adult PT Treatment/Exercise - 02/01/19 0001      Exercises   Exercises  Other Exercises    Other Exercises   Lymphatic facilitation exercises w/VC to avoid pain with all movement and demonstration for pt  to perform return demonstration: Cervical flexion, extension, R side bending, L side bending, R Rotation/L rotation, Shoulder flexion, Shoulder shrug/depression, scapular retraction, trunk side bending R/L, Hip extension in standing, Seated knee flexion/extension, ankle DF/PF 5x each.              PT Education - 02/01/19 0957    Education Details  Lymphatic facilitation exercises. Pt was educated in depth on the anatomy and physiology of the lymphatic system and reasons that lymphedema can occur. Discussed complete decongestive therapy including skin care, exercise, compresion and manual lymph drainage. Discussed the importance of compression and the role of compression in managing lymphedema.    Person(s) Educated  Patient    Methods  Explanation;Demonstration;Handout;Verbal cues    Comprehension  Verbalized understanding;Returned demonstration       PT Short Term Goals - 02/01/19 1007      PT SHORT TERM GOAL #1   Title  Pt will be independent with self MLD and HEP within 2 weeks in order to demonstrate autonomy of care.    Baseline  Pt did not have an HEP and is unaware of how to perform MLD.    Time  2    Period  Weeks    Status  New    Target Date  02/22/19        PT Long Term Goals - 02/01/19 1007      PT LONG TERM GOAL #1   Title  Patient will have reduction of L distal thigh girth by 3 cm to demonstrate decreased fluid build up in the L lower extremity.    Baseline  see measurements    Time  4    Period  Weeks    Status  New    Target Date  03/08/19      PT LONG TERM GOAL #2   Title  Patient to be properly fitted with compression garment to wear on daily basis.    Baseline  Pt has a garment but is unsure if it fits correctly or is the correct compression    Time  4    Period  Weeks    Status  New    Target Date  03/08/19      PT LONG TERM GOAL #3   Title  Patient will be independent in prevention/self-care  management principles including self-massage and long  term management plan for edema.    Baseline  Pt is unaware of how to manage lymphedema    Time  4    Period  Weeks    Status  New    Target Date  03/08/19      PT LONG TERM GOAL #4   Title  Pt endurance goals will be set following 6 minute walk test.    Time  4    Period  Weeks    Target Date  03/08/19      PT LONG TERM GOAL #5   Title  Pt will improve LLIS score to 10% or less within 4 weeks to demonstrate impove quality of life with lymphedema    Baseline  17.65%    Time  4    Period  Weeks    Status  New    Target Date  03/08/19             Plan - 02/01/19 0959    Clinical Impression Statement  Pt presents to physical therapy services with reports of decreased endurance and edema in the L lower extremity. Pt has a history of prostate cancer in 2012 and a sarcoma in the R lower leg in 2019 that were both treated with radiation. Pt reports that in september of this year he started to notice a little swelling in his LLE which is the side he had most of the radiation for his prostate. Pt has worn a compression garment intermittently but never consistently and reports initial significant improvement with elevation of the leg. Pt circumferential measurements of the LLE are significantly larger than the RLE (lower leg measurement difference can also be contributed to significant tissue removal related to sarcoma). Pt gets short of breathe easily with not much activity. Due to time restraits the 6 min walk test was not performed today but pt did get short of breathe with very little activity. Pt will benefit from skilled physical therapy services 2x/week for 4 weeks in order to address the above mentioned limitations.    Personal Factors and Comorbidities  Comorbidity 2    Comorbidities  Hx prostate cancer and R lower leg sarcoma    Stability/Clinical Decision Making  Stable/Uncomplicated    Clinical Decision Making  Low    Rehab Potential  Good    PT Frequency  2x / week    PT  Duration  4 weeks    PT Treatment/Interventions  Functional mobility training;Therapeutic activities;Therapeutic exercise;Neuromuscular re-education;Patient/family education;Manual techniques    PT Next Visit Plan  6 min walk test on the treadmill (set endurance goals), Teach self MLD, look at compression garmen    PT Home Exercise Plan  Access Code: [email or web address]    Consulted and Agree with Plan of Care  Patient       Patient will benefit from skilled therapeutic intervention in order to improve the following deficits and impairments:  Decreased endurance, Increased edema  Visit Diagnosis: Lymphedema, not elsewhere classified - Plan: PT plan of care cert/re-cert  Other abnormalities of gait and mobility - Plan: PT plan of care cert/re-cert     Problem List Patient Active Problem List   Diagnosis Date Noted  . Major depressive disorder with single episode, in partial remission ([place]) 05/04/2018  . Atherosclerosis of aorta ([place]) 05/04/2018  . Thoracic aortic aneurysm without rupture ([place]) 05/04/2018  . Urethral stricture in male 12/20/2017  . Pulmonary nodules/lesions, multiple 12/20/2017  . Atherosclerosis of renal  artery ([place]) 07/05/2017  . Chronic kidney disease, stage 3 03/21/2017  . Complication of skin graft 03/16/2017  . Squamous cell carcinoma, leg, right 01/25/2017  . Large vessel vasculitis ([place]) 06/10/2015  . H/O drug therapy 12/31/2013  . Hyperlipidemia 11/04/2013  . Delayed effect of radiation 05/06/2013  . Depression 04/01/2013  . Male sexual dysfunction 01/20/2013  . Fatigue 10/22/2012  . Hypothyroidism 07/23/2012  . History of prostate cancer   . Essential hypertension   . Tonsillar cancer ([place])   . Binocular vision disorder with diplopia 07/10/2012  . PLMD (periodic limb movement disorder) 09/13/2011  . OSA (obstructive sleep apnea) 09/13/2011  . Actinic keratosis 01/28/2011  . Vitamin B 12 deficiency 01/28/2011  . Spondylosis 08/02/2010  . Insomnia 08/02/2010     Ander Purpura, PT 02/01/2019, 10:14 AM  [place], [place], [address] Phone: [phone]   Fax:  [phone]  Name: Philip [name] MRN: [id number] Date of Birth: [birth year]

## 2019-02-01 NOTE — Patient Instructions (Signed)
Access Code: [email or web address]  URL: [email or web address]  Date: 02/01/2019  Prepared by: Tomma Rakers   Exercises Standing Upper Cervical Flexion and Extension - 20 reps - 1 sets - 1x daily - 7x weekly Standing Cervical Sidebending AROM - 20 reps - 1 sets - 1x daily - 7x weekly Standing Cervical Rotation AROM - 20 reps - 1 sets - 1x daily - 7x weekly Standing Shoulder Flexion Full Range - 20 reps - 1 sets - 1x daily - 7x weekly Standing Shoulder Shrugs - 20 reps - 1 sets - 1x daily - 7x weekly Standing Scapular Retraction - 20 reps - 1 sets - 1x daily - 7x weekly Trunk Sidebending with Compression Garment - 20 reps - 1 sets - 1x daily - 7x weekly Standing Hip Extension with Counter Support - 20 reps - 1 sets - 1x daily - 7x weekly Seated Knee Flexion Extension AROM - 20 reps - 1 sets - 1x daily - 7x weekly Heel Toe Raises with Counter Support - 20 reps - 1 sets - 1x daily - 7x weekly

## 2019-02-01 NOTE — Telephone Encounter (Signed)
Labs returned from 01/31/19.  Please notify him of his results and abstract them. Electrolytes normal. Kidneys stable. Liver normal. Blood counts also within normal range but red blood cells large in size. B12 is low at 230 with recommendation for older adults to have b12 of about [phone]. I recommend he begin a daily B12 supplement of 1075mcg daily--please add to his med list--he can purchase over the counter.  We should recheck his B12 at [facility] in 3 months. His thyroid level is borderline.  Not at a point warranting a change in his medication. Iron panel is ok.   Vitamin D level is ok.  Philip [name] L. Soumya Colson, D.O. [facility] [address], [place] [address] Cell Phone (Mon-Fri 8am-5pm):  [phone] On Call:  [phone] & follow prompts after 5pm & weekends Office Phone:  [phone] Office Fax:  [phone]

## 2019-02-04 ENCOUNTER — Ambulatory Visit: Payer: Medicare Other

## 2019-02-04 ENCOUNTER — Other Ambulatory Visit: Payer: Self-pay

## 2019-02-04 DIAGNOSIS — R2689 Other abnormalities of gait and mobility: Secondary | ICD-10-CM | POA: Diagnosis not present

## 2019-02-04 DIAGNOSIS — I890 Lymphedema, not elsewhere classified: Secondary | ICD-10-CM

## 2019-02-04 NOTE — Patient Instructions (Addendum)
Deep Effective Breath   Standing, sitting, or laying down place both hands on the belly. Take a deep breath IN, expanding the belly; then breath OUT, contracting the belly. Repeat __5__ times. Do __2-3__ sessions per day and before each self massage.  http://gt2.exer.us/866   Copyright  VHI. All rights reserved.  Inguinal Nodes to Axilla - Clear   On involved side, at armpit, make _5__ in-place circles then perform the same thing in the groin on the involved side. Then from hip proceed in sections to armpit with stationary circles or pumps _5_ times, this is your pathway. Do _1__ time per day.  Copyright  VHI. All rights reserved.  LEG: Knee to Hip - Clear   Pump up outer thigh of involved leg from knee to outer hip. Then do stationary circles from inner to outer thigh, then do outer thigh again. Next, interlace fingers behind knee IF ABLE and make in-place circles. Do _5_ times of each sequence.  Do _1__ time per day.  Copyright  VHI. All rights reserved.  LEG: Ankle to Hip Sweep   Hands on sides of ankle of involved leg, pump _5__ times up both sides of lower leg, then pump your hands at the ankle 5x pushing fluid up. From toes push fluid from to ankle 5x;  then retrace steps up outer thigh to hip as before and back to pathway. Do _2-3_ times. Do __1_ time per day.  Copyright  VHI. All rights reserved.  FOOT: Dorsum of Foot and Toes Massage   One hand on top of foot make _5_ stationary circles or pumps, then either on top of toes or each individual toe do _5_ pumps. Then retrace all steps pumping back up both sides of lower leg, outer thigh, and then pathway. Finish with what you started with, _5_ circles at involved side arm pit. All _2-3_ times at each sequence. Do _1__ time per day.  Copyright  VHI. All rights reserved.

## 2019-02-04 NOTE — Therapy (Signed)
[place], [place], [address] Phone: [phone]   Fax:  [phone]  Physical Therapy Treatment  Patient Details  Name: Philip [name] MRN: [id number] Date of Birth: [birth year] Referring Provider (PT): Hollace Kinnier DO   Encounter Date: 02/04/2019  PT End of Session - 02/04/19 0950    Visit Number  2    Number of Visits  9    Date for PT Re-Evaluation  03/08/19    PT Start Time  0910    PT Stop Time  1003    PT Time Calculation (min)  53 min    Activity Tolerance  Patient tolerated treatment well    Behavior During Therapy  [facility] for tasks assessed/performed       Past Medical History:  Diagnosis Date  . Aneurysm of left renal artery (HCC)    per CT 08-20-2015 stable 52mm  . 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 Basal cell carcinoma   . Bladder neck contracture   . BPH (benign prostatic hyperplasia)   . CKD (chronic kidney disease), stage III   . Complication of anesthesia    post op acute urinary retention  . DDD (degenerative disc disease)   . Foley catheter in place    placed 02-29-2019 secondary to urinary retention post surgery  . Full dentures   . GERD (gastroesophageal reflux disease)   . History of external beam radiation therapy 2002   prostate cancer  and boost with radioative prostate seed implants  . History of prostate cancer DX  2002   S/P EXTERNAL RADIATION/ RADIOACTIVE SEED IMPLANTS  2003  . History of squamous cell carcinoma excision    2012--  RIGHT LOWER EXTREM;  2019 LEFT LOWER EXTREMITIY  . HTN (hypertension)   . Hyperlipemia   . Hypothyroidism    endocrinologist-  dr Chalmers Cater--- per pt takes his own thyroid med. called NP Thyroid 10 mg daily, does not take synthroid  . Large vessel vasculitis (HCC)    aortitis--- rheumotologist-  dr Charlestine Night  . Left renal artery stenosis (HCC)    proximal per  CT 08-20-2015 but patent  . Left renal atrophy   . Macular degeneration   . Nocturia   . OSA on CPAP    pt retested 11-13-2016 at [address] w/ dr ather-- moderate to severe OSA , AHI 16.4/h/  03-24-2017 per pt is consistant using every night  . Pulmonary nodule    per CT 02-28-2017 in [place] in epic done at [place],  LUL nodule , not mets  . Renal cyst, right   . Restless legs syndrome (RLS) 10/31/2011  . S/P radiation therapy     for tonsillar cancer (head and neck) completed 01-26-2012 at [facility])  . Saliva decreased   . Sarcoma of lower extremity, right ([place]) oncolgoist-  dr Juluis Rainier ([place])   dx 02-14-2017 w/ needle core bx;  high grade pleomorphic spindle cell sarcoma , grade 2 (cT2N0M0); 03-15-2017  radical resection sarcoma tumor right lower leg  and plan radiation  . Self-catheterizes urinary bladder    QID   AND   PRN  . 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 Urethral stricture    chronic---  post urethral dilation's  . Urinary retention with incomplete bladder emptying   . Vitamin B 12 deficiency 01/28/2011  . Vitamin D deficiency     Past Surgical History:  Procedure Laterality Date  . BALLOON DILATION N/A 11/10/2014   Procedure: CYSTO BALLOON DILATION AND RETROGRADE URETHROGRAM ;  Surgeon: Bjorn Loser, MD;  Location: [facility];  Service: Urology;  Laterality: N/A;  . CATARACT EXTRACTION W/ INTRAOCULAR LENS  IMPLANT, BILATERAL    . CYSTO/ BALLOON DILATION OF  URETHRAL STRICTURE  12-25-2010  . CYSTOSCOPY WITH RETROGRADE URETHROGRAM N/A 03/30/2016   Procedure: CYSTOSCOPY WITH RETROGRADE URETHROGRAM AND BALLOON DILATION with cystogram;  Surgeon: Bjorn Loser, MD;   Location: [facility];  Service: Urology;  Laterality: N/A;  . CYSTOSCOPY WITH URETHRAL DILATATION  05/31/2011   Procedure: CYSTOSCOPY WITH URETHRAL DILATATION;  Surgeon: Reece Packer, MD;  Location: [place];  Service: Urology;  Laterality: N/A;  BALLOON DILATION  . CYSTOSCOPY WITH URETHRAL DILATATION N/A 09/13/2016   Procedure: CYSTOSCOPY WITH URETHRAL BALLOON DILATATION;  Surgeon: Bjorn Loser, MD;  Location: [place];  Service: Urology;  Laterality: N/A;  . CYSTOSCOPY WITH URETHRAL DILATATION N/A 03/30/2017   Procedure: CYSTOSCOPY WITH BALLOON URETHRAL DILATATION;  Surgeon: Bjorn Loser, MD;  Location: [place];  Service: Urology;  Laterality: N/A;  . CYSTOSCOPY/RETROGRADE/URETEROSCOPY N/A 05/22/2012   Procedure: CYSTOSCOPY BALLOON DILATION RETROGRADE URETEROGRAM ;  Surgeon: Reece Packer, MD;  Location: [place];  Service: Urology;  Laterality: N/A;  . CYTSO/  DILATATION URETHRAL STRICTURE/  BX PROSTATIC URETHRA/  REMOVAL FOREIGN BODIES  06-25-2010   [facility]  . INGUINAL HERNIA REPAIR Right 2000  . MOHS SURGERY  01/ 2019    [facility]   left lower leg for SCC  . RADICAL RESECTION OF SARCOMA TUMOR   03-15-2017   [facility]   RIGHT LOWER LEG , CALF AREA  . RADIOACTIVE SEED IMPLANTS, PROSTATE  JAN  2003  . SKIN LESION EXCISION  07/2012   MOST  right shoulder    There were no vitals filed for this visit.  Subjective Assessment - 02/04/19 0925    Subjective  Pt and daughters had questions this AM; Daughters were on the phone that pt was holding with questions concerning imaging and lymphedema. (See education) Pt reports he has been wearing his compression sock since I saw him at his last visit.    Pertinent History  Hx Prostate cancer and R lower leg sarcoma    How long can you walk comfortably?  Pt states that his legs get achy and he feels winded after walking about 40 minutes    Diagnostic tests  MRI,  CT scan and doppler without significant findings.    Patient Stated Goals  I want to get this swelling down and work on my endurance.    Currently in Pain?  No/denies    Multiple Pain Sites  No            LYMPHEDEMA/ONCOLOGY QUESTIONNAIRE - 02/04/19 0931      Left Lower Extremity Lymphedema   30 cm Proximal to Suprapatella  52 cm    20 cm Proximal to Suprapatella  47.8 cm    10 cm Proximal to Suprapatella  44.8 cm    At Midpatella/Popliteal Crease  39.5 cm    30 cm Proximal to Floor at Lateral Plantar Foot  33 cm  20 cm Proximal to Floor at Lateral Plantar Foot  24 cm    10 cm Proximal to Floor at Lateral Malleoli  26.5 cm    5 cm Proximal to 1st MTP Joint  23.2 cm    Around Proximal Great Toe  8.8 cm           Outpatient [facility]  Lymphedema Life Impact Scale Total Score  17.65 %           [facility] Adult PT Treatment/Exercise - 02/04/19 0001      Bed Mobility   Bed Mobility  Sit to Sidelying Left      Manual Therapy   Manual Therapy  Edema management;Manual Lymphatic Drainage (MLD)    Edema Management  looked at pt compression garment.     Manual Lymphatic Drainage (MLD)  in supine: 5 diaphragmatic breathes, L axillary and inguinal nodes, L inguino-axillary anastomosis, IN sitting: lateral L thigh, medial to lateral thigh, lateral L thigh, posterior knee, all surfaces of lower leg, Bil malleoli, dorsum of the foot. Re-worked all surfaces. Pt was educated throughout with an emphasison direction, skin stretch and pressure to ensure accuracy and best results.              PT Education - 02/04/19 0945    Education Details  Pt and daughters were educatedon the anatomy and physiology of the lymphatic system and CDT including manual lymph drainage, proper compression and exercise. Pt consented and had his daughers on speaker phone to speak about his care. Pt daughers were told that per imaging report that  pt has already spoke to his MD about there were no significant findings (within PT scope of practice) and that pt is demonstrating unique symptoms that are common with lymphedema including assymetric edema, swelling at the malleoli, and positive stemmers sign. Pt was educated on self MLD with demonstration, hand over hand contact, emphasizing correct direction, pressure, and skin stretch. Discussed compression garment with pt including he has a 15-20 mmHG compression garment but will benefit from a greater amount of compression a this time.    Person(s) Educated  Patient;Child(ren)    Methods  Explanation;Demonstration;Verbal cues;Handout;Tactile cues    Comprehension  Verbalized understanding;Returned demonstration       PT Short Term Goals - 02/01/19 1007      PT SHORT TERM GOAL #1   Title  Pt will be independent with self MLD and HEP within 2 weeks in order to demonstrate autonomy of care.    Baseline  Pt did not have an HEP and is unaware of how to perform MLD.    Time  2    Period  Weeks    Status  New    Target Date  02/22/19        PT Long Term Goals - 02/01/19 1007      PT LONG TERM GOAL #1   Title  Patient will have reduction of L distal thigh girth by 3 cm to demonstrate decreased fluid build up in the L lower extremity.    Baseline  see measurements    Time  4    Period  Weeks    Status  New    Target Date  03/08/19      PT LONG TERM GOAL #2   Title  Patient to be properly fitted with compression garment to wear on daily basis.    Baseline  Pt has a garment but is unsure if it  fits correctly or is the correct compression    Time  4    Period  Weeks    Status  New    Target Date  03/08/19      PT LONG TERM GOAL #3   Title  Patient will be independent in prevention/self-care management principles including self-massage and long term management plan for edema.    Baseline  Pt is unaware of how to manage lymphedema    Time  4    Period  Weeks    Status  New     Target Date  03/08/19      PT LONG TERM GOAL #4   Title  Pt endurance goals will be set following 6 minute walk test.    Time  4    Period  Weeks    Target Date  03/08/19      PT LONG TERM GOAL #5   Title  Pt will improve LLIS score to 10% or less within 4 weeks to demonstrate impove quality of life with lymphedema    Baseline  17.65%    Time  4    Period  Weeks    Status  New    Target Date  03/08/19            Plan - 02/04/19 0950    Clinical Impression Statement  Pt presents to physical therapy on the phone with his daughters who pt stated had questions concerning lymphdema. All questions were answered to [facility] satisfaction. Pt presented with his compression garment in placed. He has a 15-20 off the shelf garment that provides very little compression at the thigh. Pt was educated that he requires slightly more compression than this and physical therapist will get script signed by MD. Pt was educated on self MLD of the L lower leg and was provided with a hand out. Pt will benefit from continued POC at this time.    Personal Factors and Comorbidities  Comorbidity 2    Comorbidities  Hx prostate cancer and R lower leg sarcoma    Rehab Potential  Good    PT Frequency  2x / week    PT Duration  4 weeks    PT Treatment/Interventions  Functional mobility training;Therapeutic activities;Therapeutic exercise;Neuromuscular re-education;Patient/family education;Manual techniques    PT Next Visit Plan  6 min walk test on the treadmill (set endurance goals), look to see if script has been signed by MD.    PT Home Exercise Plan  Access Code: [email or web address]    Consulted and Agree with Plan of Care  Patient       Patient will benefit from skilled therapeutic intervention in order to improve the following deficits and impairments:  Decreased endurance, Increased edema  Visit Diagnosis: Lymphedema, not elsewhere classified  Other abnormalities of gait and mobility     Problem  List Patient Active Problem List   Diagnosis Date Noted  . Major depressive disorder with single episode, in partial remission ([place]) 05/04/2018  . Atherosclerosis of aorta ([place]) 05/04/2018  . Thoracic aortic aneurysm without rupture ([place]) 05/04/2018  . Urethral stricture in male 12/20/2017  . Pulmonary nodules/lesions, multiple 12/20/2017  . Atherosclerosis of renal artery ([place]) 07/05/2017  . Chronic kidney disease, stage 3 03/21/2017  . Complication of skin graft 03/16/2017  . Squamous cell carcinoma, leg, right 01/25/2017  . Large vessel vasculitis ([place]) 06/10/2015  . H/O drug therapy 12/31/2013  . Hyperlipidemia 11/04/2013  . Delayed effect of radiation 05/06/2013  . Depression 04/01/2013  .  Male sexual dysfunction 01/20/2013  . Fatigue 10/22/2012  . Hypothyroidism 07/23/2012  . History of prostate cancer   . Essential hypertension   . Tonsillar cancer ([place])   . Binocular vision disorder with diplopia 07/10/2012  . PLMD (periodic limb movement disorder) 09/13/2011  . OSA (obstructive sleep apnea) 09/13/2011  . Actinic keratosis 01/28/2011  . Vitamin B 12 deficiency 01/28/2011  . Spondylosis 08/02/2010  . Insomnia 08/02/2010    Ander Purpura, PT 02/04/2019, 10:05 AM  [place], [place], [address] Phone: [phone]   Fax:  [phone]  Name: Philip [name] MRN: [id number] Date of Birth: December 05, 1932

## 2019-02-05 ENCOUNTER — Ambulatory Visit: Payer: Medicare Other

## 2019-02-07 ENCOUNTER — Ambulatory Visit: Payer: Medicare Other

## 2019-02-07 ENCOUNTER — Other Ambulatory Visit: Payer: Self-pay

## 2019-02-07 DIAGNOSIS — R2689 Other abnormalities of gait and mobility: Secondary | ICD-10-CM

## 2019-02-07 DIAGNOSIS — I890 Lymphedema, not elsewhere classified: Secondary | ICD-10-CM | POA: Diagnosis not present

## 2019-02-07 NOTE — Therapy (Signed)
[place], [place], [address] Phone: [phone]   Fax:  [phone]  Physical Therapy Treatment  Patient Details  Name: Philip [name] MRN: [id number] Date of Birth: 11/23/1932 Referring Provider (PT): Hollace Kinnier DO   Encounter Date: 02/07/2019  PT End of Session - 02/07/19 1111    Visit Number  3    Number of Visits  9    Date for PT Re-Evaluation  03/08/19    PT Start Time  1107    PT Stop Time  1200    PT Time Calculation (min)  53 min    Activity Tolerance  Patient tolerated treatment well    Behavior During Therapy  [facility] for tasks assessed/performed       Past Medical History:  Diagnosis Date  . Aneurysm of left renal artery (HCC)    per CT 08-20-2015 stable 22mm  . 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 Basal cell carcinoma   . Bladder neck contracture   . BPH (benign prostatic hyperplasia)   . CKD (chronic kidney disease), stage III   . Complication of anesthesia    post op acute urinary retention  . DDD (degenerative disc disease)   . Foley catheter in place    placed 02-29-2019 secondary to urinary retention post surgery  . Full dentures   . GERD (gastroesophageal reflux disease)   . History of external beam radiation therapy 2002   prostate cancer  and boost with radioative prostate seed implants  . History of prostate cancer DX  2002   S/P EXTERNAL RADIATION/ RADIOACTIVE SEED IMPLANTS  2003  . History of squamous cell carcinoma excision    2012--  RIGHT LOWER EXTREM;  2019 LEFT LOWER EXTREMITIY  . HTN (hypertension)   . Hyperlipemia   . Hypothyroidism    endocrinologist-  dr Chalmers Cater--- per pt takes his own thyroid med. called NP Thyroid 10 mg daily, does not take synthroid  . Large vessel vasculitis (HCC)    aortitis--- rheumotologist-  dr Charlestine Night  . Left renal artery stenosis (HCC)    proximal per  CT 08-20-2015 but patent  . Left renal atrophy   . Macular degeneration   . Nocturia   . OSA on CPAP    pt retested 11-13-2016 at [facility] w/ dr ather-- moderate to severe OSA , AHI 16.4/h/  03-24-2017 per pt is consistant using every night  . Pulmonary nodule    per CT 02-28-2017 in [place] in epic done at [place],  LUL nodule , not mets  . Renal cyst, right   . Restless legs syndrome (RLS) 10/31/2011  . S/P radiation therapy     for tonsillar cancer (head and neck) completed 01-26-2012 at [facility])  . Saliva decreased   . Sarcoma of lower extremity, right ([place]) oncolgoist-  dr Juluis Rainier ([place])   dx 02-14-2017 w/ needle core bx;  high grade pleomorphic spindle cell sarcoma , grade 2 (cT2N0M0); 03-15-2017  radical resection sarcoma tumor right lower leg  and plan radiation  . Self-catheterizes urinary bladder    QID   AND   PRN  . 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 Urethral stricture    chronic---  post urethral dilation's  . Urinary retention with incomplete bladder emptying   . Vitamin B 12 deficiency 01/28/2011  . Vitamin D deficiency     Past Surgical History:  Procedure Laterality Date  . BALLOON DILATION N/A 11/10/2014   Procedure: CYSTO BALLOON DILATION AND RETROGRADE URETHROGRAM ;  Surgeon: Bjorn Loser, MD;  Location: [facility];  Service: Urology;  Laterality: N/A;  . CATARACT EXTRACTION W/ INTRAOCULAR LENS  IMPLANT, BILATERAL    . CYSTO/ BALLOON DILATION OF  URETHRAL STRICTURE  12-25-2010  . CYSTOSCOPY WITH RETROGRADE URETHROGRAM N/A 03/30/2016   Procedure: CYSTOSCOPY WITH RETROGRADE URETHROGRAM AND BALLOON DILATION with cystogram;  Surgeon: Bjorn Loser, MD;   Location[facility];  Service: Urology;  Laterality: N/A;  . CYSTOSCOPY WITH URETHRAL DILATATION  05/31/2011   Procedure: CYSTOSCOPY WITH URETHRAL DILATATION;  Surgeon: Reece Packer, MD;  Location: [facility];  Service: Urology;  Laterality: N/A;  BALLOON DILATION  . CYSTOSCOPY WITH URETHRAL DILATATION N/A 09/13/2016   Procedure: CYSTOSCOPY WITH URETHRAL BALLOON DILATATION;  Surgeon: Bjorn Loser, MD;  Location: [place] [facility];  Service: Urology;  Laterality: N/A;  . CYSTOSCOPY WITH URETHRAL DILATATION N/A 03/30/2017   Procedure: CYSTOSCOPY WITH BALLOON URETHRAL DILATATION;  Surgeon: Bjorn Loser, MD;  Location: [place];  Service: Urology;  Laterality: N/A;  . CYSTOSCOPY/RETROGRADE/URETEROSCOPY N/A 05/22/2012   Procedure: CYSTOSCOPY BALLOON DILATION RETROGRADE URETEROGRAM ;  Surgeon: Reece Packer, MD;  Location: [facility];  Service: Urology;  Laterality: N/A;  . CYTSO/  DILATATION URETHRAL STRICTURE/  BX PROSTATIC URETHRA/  REMOVAL FOREIGN BODIES  06-25-2010   [facility]  . INGUINAL HERNIA REPAIR Right 2000  . MOHS SURGERY  01/ 2019    [facility]   left lower leg for SCC  . RADICAL RESECTION OF SARCOMA TUMOR   03-15-2017   [facility]   RIGHT LOWER LEG , CALF AREA  . RADIOACTIVE SEED IMPLANTS, PROSTATE  JAN  2003  . SKIN LESION EXCISION  07/2012   MOST  right shoulder    There were no vitals filed for this visit.  Subjective Assessment - 02/07/19 1112    Subjective  Pt states that he has been wearing his compression garment consistently but did not wear it today because he knew that he was coming to physical therapy. Pt states that it is okay for his daughter to have a copy of MD script to help him purchase a garment.    Pertinent History  Hx Prostate cancer and R lower leg sarcoma    How long can you walk comfortably?  Pt states that his legs get achy and he feels winded after walking about 40 minutes     Diagnostic tests  MRI, CT scan and doppler without significant findings.    Patient Stated Goals  I want to get this swelling down and work on my endurance.    Currently in Pain?  No/denies    Multiple Pain Sites  No            LYMPHEDEMA/ONCOLOGY QUESTIONNAIRE - 02/07/19 1154      Left Lower Extremity Lymphedema   30 cm Proximal to Suprapatella  52.8 cm   proximal thigh    10 cm Proximal to Suprapatella  43.5 cm   distal thigh    30 cm Proximal to Floor at Lateral Plantar Foot  36.7 cm   calf   10 cm  Proximal to Floor at Lateral Malleoli  25 cm   ankle   5 cm Proximal to 1st MTP Joint  24.5 cm   mid foot   Other  50 cm    heel to knee crease   Other  34 cm   knee crease to buttocks   Other  41 cm    ankle to knee, inseam 70 cm, waist 88 cm, hips 95.5 cm          Outpatient Re[facility] from [facility]/01/[facility]  Lymphedema Life Impact Scale Total Score  17.65 %           [facility] Adult PT Treatment/Exercise - 02/07/19 0001      Manual Therapy   Manual Therapy  Manual Lymphatic Drainage (MLD);Edema management    Edema Management  pt circumferential measurements were taken today for compression garments including leg and shorts     Manual Lymphatic Drainage (MLD)  in supine: 5 diaphragmatic breathes, L axillary and Bil inguinal nodes, L inguino-axillary anastomosis, In sitting: lateral L thigh, medial to lateral thigh, lateral L thigh, posterior knee, all surfaces of lower leg, Bil malleoli, dorsum of the foot , deep abdominals. Re-worked all surfaces. Re-iterated education throughout with an emphasison direction, skin stretch and pressure to ensure accuracy and best results with pt performing MLD step by step with physical therapist supervision.              PT Education - 02/07/19 1123    Education Details  re-iterated education on self MLD with changing words from pump/stationary circles to push up/pull up, push out for  better understanding by patient. Re-iterated educationon correct pressure, direction and technique using VC, demonstration    Person(s) Educated  Patient    Methods  Explanation;Demonstration;Verbal cues    Comprehension  Verbalized understanding;Returned demonstration       PT Short Term Goals - 02/01/19 1007      PT SHORT TERM GOAL #1   Title  Pt will be independent with self MLD and HEP within 2 weeks in order to demonstrate autonomy of care.    Baseline  Pt did not have an HEP and is unaware of how to perform MLD.    Time  2    Period  Weeks    Status  New    Target Date  02/22/19        PT Long Term Goals - 02/01/19 1007      PT LONG TERM GOAL #1   Title  Patient will have reduction of L distal thigh girth by 3 cm to demonstrate decreased fluid build up in the L lower extremity.    Baseline  see measurements    Time  4    Period  Weeks    Status  New    Target Date  03/08/19      PT LONG TERM GOAL #2   Title  Patient to be properly fitted with compression garment to wear on daily basis.    Baseline  Pt has a garment but is unsure if it fits correctly or is the correct compression    Time  4    Period  Weeks    Status  New    Target Date  03/08/19      PT LONG TERM GOAL #3   Title  Patient will be independent in prevention/self-care management principles including self-massage and long term management plan for edema.    Baseline  Pt is  unaware of how to manage lymphedema    Time  4    Period  Weeks    Status  New    Target Date  03/08/19      PT LONG TERM GOAL #4   Title  Pt endurance goals will be set following 6 minute walk test.    Time  4    Period  Weeks    Target Date  03/08/19      PT LONG TERM GOAL #5   Title  Pt will improve LLIS score to 10% or less within 4 weeks to demonstrate impove quality of life with lymphedema    Baseline  17.65%    Time  4    Period  Weeks    Status  New    Target Date  03/08/19            Plan - 02/07/19 1111     Clinical Impression Statement  Pt presents to physical therapy with questions concerning self MLD due to language in informational handout provided. Pt was educated on correct technique and re-iterated education as well as wording was changed on the handout to assist with better understanding. Pt asked while in the clinic to have his garment script sent to his daughter who helps him with ordering medical supplies. Pt measurements were taken today for garments so markers were slightly different but is demonstrating slight increase in fluid most likely due to pt presented today without his compression garment. Pt will benefit from continued POC at this time.    Personal Factors and Comorbidities  Comorbidity 2    Comorbidities  Hx prostate cancer and R lower leg sarcoma    Rehab Potential  Good    PT Frequency  2x / week    PT Duration  4 weeks    PT Treatment/Interventions  Functional mobility training;Therapeutic activities;Therapeutic exercise;Neuromuscular re-education;Patient/family education;Manual techniques    PT Next Visit Plan  6 min walk test on the treadmill (set endurance goals), ask if daughter has ordered garment,  assess accuracy with MLD    PT Home Exercise Plan  Access Code: [email or web address]    Recommended Other Services  compression garment for LLE velcro wrap vs. light wgt garment and possible pump.    Consulted and Agree with Plan of Care  Patient       Patient will benefit from skilled therapeutic intervention in order to improve the following deficits and impairments:  Decreased endurance, Increased edema  Visit Diagnosis: Lymphedema, not elsewhere classified  Other abnormalities of gait and mobility     Problem List Patient Active Problem List   Diagnosis Date Noted  . Major depressive disorder with single episode, in partial remission ([place]) 05/04/2018  . Atherosclerosis of aorta ([place]) 05/04/2018  . Thoracic aortic aneurysm without rupture ([place]) 05/04/2018  . Urethral  stricture in male 12/20/2017  . Pulmonary nodules/lesions, multiple 12/20/2017  . Atherosclerosis of renal artery ([place]) 07/05/2017  . Chronic kidney disease, stage 3 03/21/2017  . Complication of skin graft 03/16/2017  . Squamous cell carcinoma, leg, right 01/25/2017  . Large vessel vasculitis ([place]) 06/10/2015  . H/O drug therapy 12/31/2013  . Hyperlipidemia 11/04/2013  . Delayed effect of radiation 05/06/2013  . Depression 04/01/2013  . Male sexual dysfunction 01/20/2013  . Fatigue 10/22/2012  . Hypothyroidism 07/23/2012  . History of prostate cancer   . Essential hypertension   . Tonsillar cancer ([place])   . Binocular vision disorder with diplopia 07/10/2012  . PLMD (periodic limb  movement disorder) 09/13/2011  . OSA (obstructive sleep apnea) 09/13/2011  . Actinic keratosis 01/28/2011  . Vitamin B 12 deficiency 01/28/2011  . Spondylosis 08/02/2010  . Insomnia 08/02/2010    Ander Purpura, PT 02/07/2019, 12:03 PM  [place], [place], [address] Phone: [phone]   Fax:  [phone]  Name: Philip [name] MRN: [id number] Date of Birth: 15-Apr-1932

## 2019-02-12 ENCOUNTER — Ambulatory Visit: Payer: Medicare Other

## 2019-02-12 ENCOUNTER — Other Ambulatory Visit: Payer: Self-pay

## 2019-02-12 DIAGNOSIS — I890 Lymphedema, not elsewhere classified: Secondary | ICD-10-CM

## 2019-02-12 DIAGNOSIS — R2689 Other abnormalities of gait and mobility: Secondary | ICD-10-CM | POA: Diagnosis not present

## 2019-02-12 NOTE — Patient Instructions (Signed)
Access Code: [email or web address]  URL: [email or web address]  Date: 02/12/2019  Prepared by: Tomma Rakers   Exercises Supine Active Straight Leg Raise - 10 reps - 2 sets - hold your back flat to the table the whole time. hold - 1x daily - 7x weekly Supine Bridge with Gluteal Set and Spinal Articulation - 15 reps - 2 sets - 1x daily - 7x weekly Supine Lower Trunk Rotation - 10 reps - 1 sets - 5 seconds stretch 5 x on each side for a total of 10 hold - 1x daily - 7x weekly Supine March with Posterior Pelvic Tilt - 20 reps - 1 sets - hold your back flat to the table the whole time as your are bringing one leg up bring the other leg down do not hold your breathe. hold - 1x daily - 7x weekly

## 2019-02-12 NOTE — Therapy (Signed)
[employer], [place], [address] Phone: [phone]   Fax:  [phone]  Physical Therapy Treatment  Patient Details  Name: Philip [name] MRN: [id number] Date of Birth: December 30, 1932 Referring Provider (PT): Hollace Kinnier DO   Encounter Date: 02/12/2019  PT End of Session - 02/12/19 1117    Visit Number  4    Number of Visits  9    Date for PT Re-Evaluation  03/08/19    PT Start Time  1100    PT Stop Time  1158    PT Time Calculation (min)  58 min    Activity Tolerance  Patient tolerated treatment well    Behavior During Therapy  [facility] for tasks assessed/performed       Past Medical History:  Diagnosis Date  . Aneurysm of left renal artery (HCC)    per CT 08-20-2015 stable 79mm  . Aortitis syndrome ([place]) rheumotologist-  dr Charlestine Night   IgG4 syndrome-- treatment prednisone- (effects abdomine)  . Basal cell carcinoma   . Bladder neck contracture   . BPH (benign prostatic hyperplasia)   . CKD (chronic kidney disease), stage III   . Complication of anesthesia    post op acute urinary retention  . DDD (degenerative disc disease)   . Foley catheter in place    placed 02-29-2019 secondary to urinary retention post surgery  . Full dentures   . GERD (gastroesophageal reflux disease)   . History of external beam radiation therapy 2002   prostate cancer  and boost with radioative prostate seed implants  . History of prostate cancer DX  2002   S/P EXTERNAL RADIATION/ RADIOACTIVE SEED IMPLANTS  2003  . History of squamous cell carcinoma excision    2012--  RIGHT LOWER EXTREM;  2019 LEFT LOWER EXTREMITIY  . HTN (hypertension)   . Hyperlipemia   . Hypothyroidism    endocrinologist-  dr Chalmers Cater--- per pt takes his own thyroid med. called NP Thyroid 10 mg daily, does not take synthroid  . Large vessel vasculitis (HCC)    aortitis--- rheumotologist-  dr Charlestine Night  . Left renal artery stenosis (HCC)    proximal per  CT 08-20-2015 but patent  . Left renal atrophy   . Macular degeneration   . Nocturia   . OSA on CPAP    pt retested 11-13-2016 at [facility] Neurology w/ dr ather-- moderate to severe OSA , AHI 16.4/h/  03-24-2017 per pt is consistant using every night  . Pulmonary nodule    per CT 02-28-2017 in [place] in epic done at [place],  LUL nodule , not mets  . Renal cyst, right   . Restless legs syndrome (RLS) 10/31/2011  . S/P radiation therapy     for tonsillar cancer (head and neck) completed 01-26-2012 at [facility])  . Saliva decreased   . Sarcoma of lower extremity, right ([place]) oncolgoist-  dr Juluis Rainier ([place])   dx 02-14-2017 w/ needle core bx;  high grade pleomorphic spindle cell sarcoma , grade 2 (cT2N0M0); 03-15-2017  radical resection sarcoma tumor right lower leg  and plan radiation  . Self-catheterizes urinary bladder    QID   AND   PRN  . Tonsillar cancer ([place]) 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+) (Stage IVA)---- dx oct 2013  ----s/p concurent chemo and radiation/  ended 01-26-2012-- no surgical intervention---  residuals ( dry mouth, decreased saliva)   oncologist at [place]--  dr brizel--  HX OF --  NO RECURRENCE  . Urethral stricture    chronic---  post urethral dilation's  . Urinary retention with incomplete bladder emptying   . Vitamin B 12 deficiency 01/28/2011  . Vitamin D deficiency     Past Surgical History:  Procedure Laterality Date  . BALLOON DILATION N/A 11/10/2014   Procedure: CYSTO BALLOON DILATION AND RETROGRADE URETHROGRAM ;  Surgeon: Bjorn Loser, MD;  Location: [facility];  Service: Urology;  Laterality: N/A;  . CATARACT EXTRACTION W/ INTRAOCULAR LENS  IMPLANT, BILATERAL    . CYSTO/ BALLOON DILATION OF  URETHRAL STRICTURE  12-25-2010  . CYSTOSCOPY WITH RETROGRADE URETHROGRAM N/A 03/30/2016   Procedure: CYSTOSCOPY WITH RETROGRADE URETHROGRAM AND BALLOON DILATION with cystogram;  Surgeon: Bjorn Loser, MD;   Location: [facility];  Service: Urology;  Laterality: N/A;  . CYSTOSCOPY WITH URETHRAL DILATATION  05/31/2011   Procedure: CYSTOSCOPY WITH URETHRAL DILATATION;  Surgeon: Reece Packer, MD;  Location: [place];  Service: Urology;  Laterality: N/A;  BALLOON DILATION  . CYSTOSCOPY WITH URETHRAL DILATATION N/A 09/13/2016   Procedure: CYSTOSCOPY WITH URETHRAL BALLOON DILATATION;  Surgeon: Bjorn Loser, MD;  Location: [place];  Service: Urology;  Laterality: N/A;  . CYSTOSCOPY WITH URETHRAL DILATATION N/A 03/30/2017   Procedure: CYSTOSCOPY WITH BALLOON URETHRAL DILATATION;  Surgeon: Bjorn Loser, MD;  Location: [place];  Service: Urology;  Laterality: N/A;  . CYSTOSCOPY/RETROGRADE/URETEROSCOPY N/A 05/22/2012   Procedure: CYSTOSCOPY BALLOON DILATION RETROGRADE URETEROGRAM ;  Surgeon: Reece Packer, MD;  Location: [place];  Service: Urology;  Laterality: N/A;  . CYTSO/  DILATATION URETHRAL STRICTURE/  BX PROSTATIC URETHRA/  REMOVAL FOREIGN BODIES  06-25-2010   [facility]  . INGUINAL HERNIA REPAIR Right 2000  . MOHS SURGERY  01/ 2019    [facility]   left lower leg for SCC  . RADICAL RESECTION OF SARCOMA TUMOR   03-15-2017   [facility]   RIGHT LOWER LEG , CALF AREA  . RADIOACTIVE SEED IMPLANTS, PROSTATE  JAN  2003  . SKIN LESION EXCISION  07/2012   MOST  right shoulder    There were no vitals filed for this visit.  Subjective Assessment - 02/12/19 1117    Subjective  Pt reports that he has been doing pretty good but that he continues to feel very fatigued with walking and increased activity. Pt reports that he has ordered his new compression garment but has not recieved it yet.    Pertinent History  Hx Prostate cancer and R lower leg sarcoma    How long can you walk comfortably?  Pt states that his legs get achy and he feels winded after walking about 40 minutes    Diagnostic tests  MRI, CT scan and doppler without  significant findings.    Patient Stated Goals  I want to get this swelling down and work on my endurance.    Currently in Pain?  No/denies    Multiple Pain Sites  No                  Outpatient [facility]  Lymphedema Life Impact Scale Total Score  17.65 %           [facility] Adult PT Treatment/Exercise - 02/12/19 0001      Exercises   Exercises  Knee/Hip      Knee/Hip Exercises: Aerobic   Stationary Bike  2 min level 3 RPE 5-6, up to6 min on  level 2 RPE remained 5-6      Knee/Hip Exercises: Supine   Bridges  2 sets;Strengthening;15 reps    Bridges Limitations  demonstration for correct movement and VC to avoid pain/squeeze glutes on ascension.     Straight Leg Raises  Strengthening;Right;Left;2 sets;10 reps    Straight Leg Raises Limitations  VC for correct movement and to maintian flat back/breathe throughout movement.     Other Supine Knee/Hip Exercises  hooked lying lumbar stretch 5x 5 seconds VC for correct movement and to avoid pain.     Other Supine Knee/Hip Exercises  Supine marching with pelvic tilt, VC for maintaining flat back throughout movement, lifting leg while other was descending and continuously breathing.       Manual Therapy   Manual Therapy  Manual Lymphatic Drainage (MLD);Edema management    Manual therapy comments  assisted with donning compression garment.     Manual Lymphatic Drainage (MLD)  in supine: 5 diaphragmatic breathes, L axillary and Bil inguinal nodes, L inguino-axillary anastomosis, In sitting: lateral L thigh, medial to lateral thigh, lateral L thigh, posterior knee, all surfaces of lower leg, Bil malleoli, dorsum of the foot , deep abdominals. Re-worked all surfaces. Re-iterated education throughout with an emphasison direction, skin stretch and pressure to ensure accuracy and best results with pt performing MLD step by step with physical therapist supervision.               PT Education - 02/12/19 1157    Education Details  Access Code: [email or web address], pt will begin HEp at home only 1x/day. He will continue wearing compression garment then wear new one when it arrives.    Person(s) Educated  Patient    Methods  Explanation;Demonstration;Verbal cues;Handout    Comprehension  Verbalized understanding;Returned demonstration       PT Short Term Goals - 02/01/19 1007      PT SHORT TERM GOAL #1   Title  Pt will be independent with self MLD and HEP within 2 weeks in order to demonstrate autonomy of care.    Baseline  Pt did not have an HEP and is unaware of how to perform MLD.    Time  2    Period  Weeks    Status  New    Target Date  02/22/19        PT Long Term Goals - 02/12/19 1208      Additional Long Term Goals   Additional Long Term Goals  Yes      PT LONG TERM GOAL #6   Title  Pt will rate himself walking for 6 minutes on the bike at level 4 at 5-6 RPE within 4 weeks to demonstrate improved endurance.    Baseline  currently pt is 5-6 RPE after 6 min on the bike at level 3 after 2 min and at level 2 after 6 min.    Time  4    Period  Weeks    Status  New    Target Date  03/12/19            Plan - 02/12/19 1116    Clinical Impression Statement  Pt presents to physical therapy today wearing his initial compression garment. He states that he has ordered a new one but it has not yet arrived. He is reporting he is still experiencing aching in his LLE and feels that he continues with difficulty with endurance. Pt was put on the seated bike today on level 3 and after 2  minutes repots a rate of perceived exertion at 5-6; level was reduced down to level 2 and he was able to stay at a 5-6 RPE for 6 minutes. Pt was provided with home exercises for the lower extremities in core in order to begin building up functional strength. MLD was performed for the L lower extremity and then he was assisted with donning his compression garment. Pt will  benefit from continued POC at this time.    Personal Factors and Comorbidities  Comorbidity 2    Comorbidities  Hx prostate cancer and R lower leg sarcoma    Rehab Potential  Good    PT Frequency  2x / week    PT Duration  4 weeks    PT Treatment/Interventions  Functional mobility training;Therapeutic activities;Therapeutic exercise;Neuromuscular re-education;Patient/family education;Manual techniques    PT Next Visit Plan  assess HEP,  ask if he has recieved garment/check garment,  assess accuracy with MLD    PT Home Exercise Plan  Access Code: [email or web address]    Consulted and Agree with Plan of Care  Patient       Patient will benefit from skilled therapeutic intervention in order to improve the following deficits and impairments:  Decreased endurance, Increased edema  Visit Diagnosis: Lymphedema, not elsewhere classified  Other abnormalities of gait and mobility     Problem List Patient Active Problem List   Diagnosis Date Noted  . Major depressive disorder with single episode, in partial remission ([place]) 05/04/2018  . Atherosclerosis of aorta ([place]) 05/04/2018  . Thoracic aortic aneurysm without rupture ([place]) 05/04/2018  . Urethral stricture in male 12/20/2017  . Pulmonary nodules/lesions, multiple 12/20/2017  . Atherosclerosis of renal artery ([place]) 07/05/2017  . Chronic kidney disease, stage 3 03/21/2017  . Complication of skin graft 03/16/2017  . Squamous cell carcinoma, leg, right 01/25/2017  . Large vessel vasculitis ([place]) 06/10/2015  . H/O drug therapy 12/31/2013  . Hyperlipidemia 11/04/2013  . Delayed effect of radiation 05/06/2013  . Depression 04/01/2013  . Male sexual dysfunction 01/20/2013  . Fatigue 10/22/2012  . Hypothyroidism 07/23/2012  . History of prostate cancer   . Essential hypertension   . Tonsillar cancer ([place])   . Binocular vision disorder with diplopia 07/10/2012  . PLMD (periodic limb movement disorder) 09/13/2011  . OSA (obstructive sleep apnea)  09/13/2011  . Actinic keratosis 01/28/2011  . Vitamin B 12 deficiency 01/28/2011  . Spondylosis 08/02/2010  . Insomnia 08/02/2010    Ander Purpura, PT 02/12/2019, 12:13 PM  [place], [place], [address] Phone: [phone]   Fax:  [phone]  Name: KALLE DONAIS MRN: [id number] Date of Birth: 1932/06/26

## 2019-02-14 ENCOUNTER — Other Ambulatory Visit: Payer: Self-pay

## 2019-02-14 ENCOUNTER — Ambulatory Visit: Payer: Medicare Other

## 2019-02-14 DIAGNOSIS — R2689 Other abnormalities of gait and mobility: Secondary | ICD-10-CM | POA: Diagnosis not present

## 2019-02-14 DIAGNOSIS — I890 Lymphedema, not elsewhere classified: Secondary | ICD-10-CM | POA: Diagnosis not present

## 2019-02-14 NOTE — Therapy (Signed)
[place], [place], [address] Phone: [phone]   Fax:  [phone]  Physical Therapy Treatment  Patient Details  Name: Philip [name] MRN: [id number] Date of Birth: Aug 01, 1932 Referring Provider (PT): Hollace Kinnier DO   Encounter Date: 02/14/2019  PT End of Session - 02/14/19 1107    Visit Number  5    Number of Visits  9    Date for PT Re-Evaluation  03/08/19    PT Start Time  1105    PT Stop Time  1200    PT Time Calculation (min)  55 min    Activity Tolerance  Patient tolerated treatment well    Behavior During Therapy  [facility] for tasks assessed/performed       Past Medical History:  Diagnosis Date  . Aneurysm of left renal artery (HCC)    per CT 08-20-2015 stable 66mm  . Aortitis syndrome ([place]) rheumotologist-  dr Charlestine Night   IgG4 syndrome-- treatment prednisone- (effects abdomine)  . Basal cell carcinoma   . Bladder neck contracture   . BPH (benign prostatic hyperplasia)   . CKD (chronic kidney disease), stage III   . Complication of anesthesia    post op acute urinary retention  . DDD (degenerative disc disease)   . Foley catheter in place    placed 02-29-2019 secondary to urinary retention post surgery  . Full dentures   . GERD (gastroesophageal reflux disease)   . History of external beam radiation therapy 2002   prostate cancer  and boost with radioative prostate seed implants  . History of prostate cancer DX  2002   S/P EXTERNAL RADIATION/ RADIOACTIVE SEED IMPLANTS  2003  . History of squamous cell carcinoma excision    2012--  RIGHT LOWER EXTREM;  2019 LEFT LOWER EXTREMITIY  . HTN (hypertension)   . Hyperlipemia   . Hypothyroidism    endocrinologist-  dr Chalmers Cater--- per pt takes his own thyroid med. called NP Thyroid 10 mg daily, does not take synthroid  . Large vessel vasculitis (HCC)    aortitis--- rheumotologist-  dr Charlestine Night  . Left renal artery stenosis (HCC)    proximal per  CT 08-20-2015 but patent  . Left renal atrophy   . Macular degeneration   . Nocturia   . OSA on CPAP    pt retested 11-13-2016 at [facility] Neurology w/ dr ather-- moderate to severe OSA , AHI 16.4/h/  03-24-2017 per pt is consistant using every night  . Pulmonary nodule    per CT 02-28-2017 in [place] in epic done at [place],  LUL nodule , not mets  . Renal cyst, right   . Restless legs syndrome (RLS) 10/31/2011  . S/P radiation therapy     for tonsillar cancer (head and neck) completed 01-26-2012 at [facility])  . Saliva decreased   . Sarcoma of lower extremity, right ([place]) oncolgoist-  dr Juluis Rainier ([place])   dx 02-14-2017 w/ needle core bx;  high grade pleomorphic spindle cell sarcoma , grade 2 (cT2N0M0); 03-15-2017  radical resection sarcoma tumor right lower leg  and plan radiation  . Self-catheterizes urinary bladder    QID   AND   PRN  . Tonsillar cancer ([place]) 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+) (Stage IVA)---- dx oct 2013  ----s/p concurent chemo and radiation/  ended 01-26-2012-- no surgical intervention---  residuals ( dry mouth, decreased saliva)   oncologist at [place]--  dr brizel--  HX OF --  NO RECURRENCE  . Urethral stricture    chronic---  post urethral dilation's  . Urinary retention with incomplete bladder emptying   . Vitamin B 12 deficiency 01/28/2011  . Vitamin D deficiency     Past Surgical History:  Procedure Laterality Date  . BALLOON DILATION N/A 11/10/2014   Procedure: CYSTO BALLOON DILATION AND RETROGRADE URETHROGRAM ;  Surgeon: Bjorn Loser, MD;  Location[facility];  Service: Urology;  Laterality: N/A;  . CATARACT EXTRACTION W/ INTRAOCULAR LENS  IMPLANT, BILATERAL    . CYSTO/ BALLOON DILATION OF  URETHRAL STRICTURE  12-25-2010  . CYSTOSCOPY WITH RETROGRADE URETHROGRAM N/A 03/30/2016   Procedure: CYSTOSCOPY WITH RETROGRADE URETHROGRAM AND BALLOON DILATION with cystogram;  Surgeon: Bjorn Loser, MD;   Location: [facility];  Service: Urology;  Laterality: N/A;  . CYSTOSCOPY WITH URETHRAL DILATATION  05/31/2011   Procedure: CYSTOSCOPY WITH URETHRAL DILATATION;  Surgeon: Reece Packer, MD;  Location: [place];  Service: Urology;  Laterality: N/A;  BALLOON DILATION  . CYSTOSCOPY WITH URETHRAL DILATATION N/A 09/13/2016   Procedure: CYSTOSCOPY WITH URETHRAL BALLOON DILATATION;  Surgeon: Bjorn Loser, MD;  Location: [place];  Service: Urology;  Laterality: N/A;  . CYSTOSCOPY WITH URETHRAL DILATATION N/A 03/30/2017   Procedure: CYSTOSCOPY WITH BALLOON URETHRAL DILATATION;  Surgeon: Bjorn Loser, MD;  Location: [place];  Service: Urology;  Laterality: N/A;  . CYSTOSCOPY/RETROGRADE/URETEROSCOPY N/A 05/22/2012   Procedure: CYSTOSCOPY BALLOON DILATION RETROGRADE URETEROGRAM ;  Surgeon: Reece Packer, MD;  Location: [place];  Service: Urology;  Laterality: N/A;  . CYTSO/  DILATATION URETHRAL STRICTURE/  BX PROSTATIC URETHRA/  REMOVAL FOREIGN BODIES  06-25-2010   [facility]  . INGUINAL HERNIA REPAIR Right 2000  . MOHS SURGERY  01/ 2019    [facility]   left lower leg for SCC  . RADICAL RESECTION OF SARCOMA TUMOR   03-15-2017   [facility]   RIGHT LOWER LEG , CALF AREA  . RADIOACTIVE SEED IMPLANTS, PROSTATE  JAN  2003  . SKIN LESION EXCISION  07/2012   MOST  right shoulder    There were no vitals filed for this visit.  Subjective Assessment - 02/14/19 1107    Subjective  Pt reports that he is down today due to the pandemic and the politics and he is just tired of it and is ready for it all to be over and things to calm down. Pt reports that his leg is feeling pretty good and that if he couldn't see it he wouldn't know anything was wrong with it.    Pertinent History  Hx Prostate cancer and R lower leg sarcoma    How long can you walk comfortably?  Pt states that his legs get achy and he feels winded after walking about  40 minutes    Diagnostic tests  MRI, CT scan and doppler without significant findings.    Patient Stated Goals  I want to get this swelling down and work on my endurance.    Currently in Pain?  No/denies    Multiple Pain Sites  No                  Outpatient [facility]  Lymphedema Life Impact Scale Total Score  17.65 %           [facility] Adult PT Treatment/Exercise - 02/14/19 0001      Knee/Hip Exercises: Aerobic   Stationary Bike  level 2 8 min HR 83, SPO2 95% initially, after 2 min HR 89 SPO2 98%, after 4 min HR 96 SPO2 98%, at 8 min HR 106, SPO2 100%  pt reports 5 on RPE throughout activity. INitially he said 7 after 2 min but then decreased and said it was more a 4-5      Knee/Hip Exercises: Standing   Knee Flexion  Strengthening;Both;2 sets    Hip Flexion Limitations  marching on airex 2x 1 min with seated rest break between sets w/CGA and VC to speed up marching as well as to be very careful when stepping up on airex.     Other Standing Knee Exercises  sit to stand 2x 10 w/rest intermittently from elevated plinth table no UE use    Other Standing Knee Exercises  side stepping w/red theraband 4 laps 2x w/prolonged rest break between sets and supervision w/VC to slow down movement.       Manual Therapy   Manual Therapy  Manual Lymphatic Drainage (MLD);Edema management    Manual Lymphatic Drainage (MLD)  in supine: 5 diaphragmatic breathes, L axillary and Bil inguinal nodes, L inguino-axillary anastomosis, figure 7 toward L axillo-inguinal anastomosis on anterior trunk lateral L thigh, medial to lateral thigh, lateral L thigh, posterior knee, all surfaces of lower leg, Bil malleoli, dorsum of the foot , deep abdominals. Re-worked all surfaces.              PT Education - 02/14/19 1145    Education Details  Pt will continue with HEP and MLD at home. He will continue to wera his compression garment. Pt will  look for compression shorts.    Person(s) Educated  Patient    Methods  Explanation    Comprehension  Verbalized understanding       PT Short Term Goals - 02/01/19 1007      PT SHORT TERM GOAL #1   Title  Pt will be independent with self MLD and HEP within 2 weeks in order to demonstrate autonomy of care.    Baseline  Pt did not have an HEP and is unaware of how to perform MLD.    Time  2    Period  Weeks    Status  New    Target Date  02/22/19        PT Long Term Goals - 02/12/19 1208      Additional Long Term Goals   Additional Long Term Goals  Yes      PT LONG TERM GOAL #6   Title  Pt will rate himself walking for 6 minutes on the bike at level 4 at 5-6 RPE within 4 weeks to demonstrate improved endurance.    Baseline  currently pt is 5-6 RPE after 6 min on the bike at level 3 after 2 min and at level 2 after 6 min.    Time  4    Period  Weeks    Status  New    Target Date  03/12/19            Plan - 02/14/19 1107    Clinical Impression Statement  Pt has received his compression garment and has been wearing it daily. It seems to fit appropriately and is a 20-30 mmHG compression. He is able to don his garment accurately w/o difficulty. Pt was able to tolerate an increase in endurance activities this session. He initially was rating himself very high on the RPE scale but after a couple of  minutes of work he was rating himself at a 4-5. He was able to perform increased activities this session with seated rest breaks between all sets. MLD was performed for the LLE with pt in supine. Pt will benefit from continued POC at this time.    Personal Factors and Comorbidities  Comorbidity 2    Comorbidities  Hx prostate cancer and R lower leg sarcoma    Rehab Potential  Good    PT Frequency  2x / week    PT Duration  4 weeks    PT Treatment/Interventions  Functional mobility training;Therapeutic activities;Therapeutic exercise;Neuromuscular re-education;Patient/family  education;Manual techniques    PT Next Visit Plan  assess HEP, progress endurance exercises, assess accuracy with MLD    PT Home Exercise Plan  Access Code: [email or web address]    Consulted and Agree with Plan of Care  Patient       Patient will benefit from skilled therapeutic intervention in order to improve the following deficits and impairments:  Decreased endurance, Increased edema  Visit Diagnosis: Lymphedema, not elsewhere classified  Other abnormalities of gait and mobility     Problem List Patient Active Problem List   Diagnosis Date Noted  . Major depressive disorder with single episode, in partial remission ([place]) 05/04/2018  . Atherosclerosis of aorta ([place]) 05/04/2018  . Thoracic aortic aneurysm without rupture ([place]) 05/04/2018  . Urethral stricture in male 12/20/2017  . Pulmonary nodules/lesions, multiple 12/20/2017  . Atherosclerosis of renal artery ([place]) 07/05/2017  . Chronic kidney disease, stage 3 03/21/2017  . Complication of skin graft 03/16/2017  . Squamous cell carcinoma, leg, right 01/25/2017  . Large vessel vasculitis ([place]) 06/10/2015  . H/O drug therapy 12/31/2013  . Hyperlipidemia 11/04/2013  . Delayed effect of radiation 05/06/2013  . Depression 04/01/2013  . Male sexual dysfunction 01/20/2013  . Fatigue 10/22/2012  . Hypothyroidism 07/23/2012  . History of prostate cancer   . Essential hypertension   . Tonsillar cancer ([place])   . Binocular vision disorder with diplopia 07/10/2012  . PLMD (periodic limb movement disorder) 09/13/2011  . OSA (obstructive sleep apnea) 09/13/2011  . Actinic keratosis 01/28/2011  . Vitamin B 12 deficiency 01/28/2011  . Spondylosis 08/02/2010  . Insomnia 08/02/2010    Ander Purpura, PT 02/14/2019, 12:05 PM  [place], [place], [address] Phone: [phone]   Fax:  [phone]  Name: TACARI CLOUTIER MRN: [id number] Date of Birth:  04-15-1932

## 2019-02-19 ENCOUNTER — Ambulatory Visit: Payer: Medicare Other | Attending: Internal Medicine

## 2019-02-19 ENCOUNTER — Other Ambulatory Visit: Payer: Self-pay

## 2019-02-19 DIAGNOSIS — R2689 Other abnormalities of gait and mobility: Secondary | ICD-10-CM | POA: Diagnosis not present

## 2019-02-19 DIAGNOSIS — I890 Lymphedema, not elsewhere classified: Secondary | ICD-10-CM | POA: Diagnosis not present

## 2019-02-19 NOTE — Therapy (Signed)
[place], [place], [address] Phone: [phone]   Fax:  [phone]  Physical Therapy Treatment  Patient Details  Name: Philip [name] MRN: [id number] Date of Birth: 01/10/1933 Referring Provider (PT): Hollace Kinnier DO   Encounter Date: 02/19/2019  PT End of Session - 02/19/19 1117    Visit Number  6    Number of Visits  9    Date for PT Re-Evaluation  03/08/19    PT Start Time  1107    PT Stop Time  1153    PT Time Calculation (min)  46 min    Activity Tolerance  Patient tolerated treatment well    Behavior During Therapy  [facility] for tasks assessed/performed       Past Medical History:  Diagnosis Date  . Aneurysm of left renal artery (HCC)    per CT 08-20-2015 stable 35mm  . Aortitis syndrome ([place]) rheumotologist-  dr Charlestine Night   IgG4 syndrome-- treatment prednisone- (effects abdomine)  . Basal cell carcinoma   . Bladder neck contracture   . BPH (benign prostatic hyperplasia)   . CKD (chronic kidney disease), stage III   . Complication of anesthesia    post op acute urinary retention  . DDD (degenerative disc disease)   . Foley catheter in place    placed 02-29-2019 secondary to urinary retention post surgery  . Full dentures   . GERD (gastroesophageal reflux disease)   . History of external beam radiation therapy 2002   prostate cancer  and boost with radioative prostate seed implants  . History of prostate cancer DX  2002   S/P EXTERNAL RADIATION/ RADIOACTIVE SEED IMPLANTS  2003  . History of squamous cell carcinoma excision    2012--  RIGHT LOWER EXTREM;  2019 LEFT LOWER EXTREMITIY  . HTN (hypertension)   . Hyperlipemia   . Hypothyroidism    endocrinologist-  dr Chalmers Cater--- per pt takes his own thyroid med. called NP Thyroid 10 mg daily, does not take synthroid  . Large vessel vasculitis (HCC)    aortitis--- rheumotologist-  dr Charlestine Night  . Left renal artery stenosis (HCC)    proximal per  CT 08-20-2015 but patent  . Left renal atrophy   . Macular degeneration   . Nocturia   . OSA on CPAP    pt retested 11-13-2016 at [facility] w/ dr ather-- moderate to severe OSA , AHI 16.4/h/  03-24-2017 per pt is consistant using every night  . Pulmonary nodule    per CT 02-28-2017 in [place] in epic done at [place],  LUL nodule , not mets  . Renal cyst, right   . Restless legs syndrome (RLS) 10/31/2011  . S/P radiation therapy     for tonsillar cancer (head and neck) completed 01-26-2012 at [facility])  . Saliva decreased   . Sarcoma of lower extremity, right ([place]) oncolgoist-  dr Juluis Rainier ([place])   dx 02-14-2017 w/ needle core bx;  high grade pleomorphic spindle cell sarcoma , grade 2 (cT2N0M0); 03-15-2017  radical resection sarcoma tumor right lower leg  and plan radiation  . Self-catheterizes urinary bladder    QID   AND   PRN  . Tonsillar cancer ([place]) 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+) (Stage IVA)---- dx oct 2013  ----s/p concurent chemo and radiation/  ended 01-26-2012-- no surgical intervention---  residuals ( dry mouth, decreased saliva)   oncologist at [place]--  dr brizel--  HX OF --  NO RECURRENCE  . Urethral stricture    chronic---  post urethral dilation's  . Urinary retention with incomplete bladder emptying   . Vitamin B 12 deficiency 01/28/2011  . Vitamin D deficiency     Past Surgical History:  Procedure Laterality Date  . BALLOON DILATION N/A 11/10/2014   Procedure: CYSTO BALLOON DILATION AND RETROGRADE URETHROGRAM ;  Surgeon: Bjorn Loser, MD;  Location[facility];  Service: Urology;  Laterality: N/A;  . CATARACT EXTRACTION W/ INTRAOCULAR LENS  IMPLANT, BILATERAL    . CYSTO/ BALLOON DILATION OF  URETHRAL STRICTURE  12-25-2010  . CYSTOSCOPY WITH RETROGRADE URETHROGRAM N/A 03/30/2016   Procedure: CYSTOSCOPY WITH RETROGRADE URETHROGRAM AND BALLOON DILATION with cystogram;  Surgeon: Bjorn Loser, MD;   Location[facility];  Service: Urology;  Laterality: N/A;  . CYSTOSCOPY WITH URETHRAL DILATATION  05/31/2011   Procedure: CYSTOSCOPY WITH URETHRAL DILATATION;  Surgeon: Reece Packer, MD;  Location: [place];  Service: Urology;  Laterality: N/A;  BALLOON DILATION  . CYSTOSCOPY WITH URETHRAL DILATATION N/A 09/13/2016   Procedure: CYSTOSCOPY WITH URETHRAL BALLOON DILATATION;  Surgeon: Bjorn Loser, MD;  Location: [place];  Service: Urology;  Laterality: N/A;  . CYSTOSCOPY WITH URETHRAL DILATATION N/A 03/30/2017   Procedure: CYSTOSCOPY WITH BALLOON URETHRAL DILATATION;  Surgeon: Bjorn Loser, MD;  Location: [place];  Service: Urology;  Laterality: N/A;  . CYSTOSCOPY/RETROGRADE/URETEROSCOPY N/A 05/22/2012   Procedure: CYSTOSCOPY BALLOON DILATION RETROGRADE URETEROGRAM ;  Surgeon: Reece Packer, MD;  Location: [place];  Service: Urology;  Laterality: N/A;  . CYTSO/  DILATATION URETHRAL STRICTURE/  BX PROSTATIC URETHRA/  REMOVAL FOREIGN BODIES  06-25-2010   [facility]  . INGUINAL HERNIA REPAIR Right 2000  . MOHS SURGERY  01/ 2019    [facility]   left lower leg for SCC  . RADICAL RESECTION OF SARCOMA TUMOR   03-15-2017   [facility]   RIGHT LOWER LEG , CALF AREA  . RADIOACTIVE SEED IMPLANTS, PROSTATE  JAN  2003  . SKIN LESION EXCISION  07/2012   MOST  right shoulder    There were no vitals filed for this visit.  Subjective Assessment - 02/19/19 1117    Subjective  Pt states that his leg continues to swell and he has been wearing his stocking every day. He states that his stocking is sliding down his leg and bunching at the ankle.    Pertinent History  Hx Prostate cancer and R lower leg sarcoma    How long can you walk comfortably?  Pt states that his legs get achy and he feels winded after walking about 40 minutes    Diagnostic tests  MRI, CT scan and doppler without significant findings.    Patient Stated  Goals  I want to get this swelling down and work on my endurance.    Currently in Pain?  No/denies    Multiple Pain Sites  No            LYMPHEDEMA/ONCOLOGY QUESTIONNAIRE - 02/19/19 1118      Left Lower Extremity Lymphedema   30 cm Proximal to Suprapatella  53 cm    20 cm Proximal to Suprapatella  49.9 cm    10 cm Proximal to Suprapatella  44.4 cm    At Midpatella/Popliteal Crease  39.7 cm    30 cm Proximal to Floor at Lateral Plantar Foot  34.4 cm    20 cm Proximal to Floor at Lateral Plantar Foot  24.9 cm    10 cm Proximal to Floor at Lateral Malleoli  25.9 cm    5 cm Proximal to 1st MTP Joint  24.7 cm    Around Proximal Great Toe  8.8 cm           [facility]patient [facility]  Lymphedema Life Impact Scale Total Score  17.65 %           [facility] Adult PT Treatment/Exercise - 02/19/19 0001      Manual Therapy   Manual Therapy  Compression Bandaging    Compression Bandaging  TG soft for base layer, 1 inch elastomull at toes digits 1-5, 3 artiflex from foot to groin, 1 8 cm at the foot for roman sandal, 1 10 cm for ankle sole heel then spiral wrap, 1 10 cm for spiral to below the knee, 2 12 cm for the knee and the thigh to the groin.              PT Education - 02/19/19 1200    Education Details  Pt will continue with HEP at home. He was educated on reasons to remove bandage including pain, numbness, tingling that does not improve with exercise or removing the top wrap. He was educated to remove the wrap if this happens and to then don his compression garment until his next visit. If he has no difficulties with wrap he will wear until his next session.    Person(s) Educated  Patient    Methods  Explanation;Verbal cues    Comprehension  Verbalized understanding       PT Short Term Goals - 02/01/19 1007      PT SHORT TERM GOAL #1   Title  Pt will be independent with self MLD and HEP within 2 weeks in order to  demonstrate autonomy of care.    Baseline  Pt did not have an HEP and is unaware of how to perform MLD.    Time  2    Period  Weeks    Status  New    Target Date  02/22/19        PT Long Term Goals - 02/12/19 1208      Additional Long Term Goals   Additional Long Term Goals  Yes      PT LONG TERM GOAL #6   Title  Pt will rate himself walking for 6 minutes on the bike at level 4 at 5-6 RPE within 4 weeks to demonstrate improved endurance.    Baseline  currently pt is 5-6 RPE after 6 min on the bike at level 3 after 2 min and at level 2 after 6 min.    Time  4    Period  Weeks    Status  New    Target Date  03/12/19            Plan - 02/19/19 1117    Clinical Impression Statement  Pt circumferential measurements were slightly larger this session despite wearing compression garment and performing MLD at home consistently. Discussed wrapping this session with patient to see if this doesn't help decrease fluid. Discussed staging with patient including that he is most likely in stage II lymphedema and he will most likely need to worrry about life long management. He was open to trying to get a vasopneumatic pump as well as wrap until he reduces. Discussed reasons to remove the wrap. Pt will benefit from continued POC at this time.  Personal Factors and Comorbidities  Comorbidity 2    Comorbidities  Hx prostate cancer and R lower leg sarcoma    Rehab Potential  Good    PT Frequency  2x / week    PT Duration  4 weeks    PT Treatment/Interventions  Functional mobility training;Therapeutic activities;Therapeutic exercise;Neuromuscular re-education;Patient/family education;Manual techniques    PT Next Visit Plan  measure, assess wrap, assess HEP, progress endurance exercises, assess accuracy with MLD    PT Home Exercise Plan  Access Code: [email or web address]    Recommended Other Services  compression garment for LLE velcro wrap vs. light wgt garment and possible pump    Consulted and Agree with  Plan of Care  Patient       Patient will benefit from skilled therapeutic intervention in order to improve the following deficits and impairments:  Decreased endurance, Increased edema  Visit Diagnosis: Lymphedema, not elsewhere classified  Other abnormalities of gait and mobility     Problem List Patient Active Problem List   Diagnosis Date Noted  . Major depressive disorder with single episode, in partial remission ([place]) 05/04/2018  . Atherosclerosis of aorta ([place]) 05/04/2018  . Thoracic aortic aneurysm without rupture ([place]) 05/04/2018  . Urethral stricture in male 12/20/2017  . Pulmonary nodules/lesions, multiple 12/20/2017  . Atherosclerosis of renal artery ([place]) 07/05/2017  . Chronic kidney disease, stage 3 03/21/2017  . Complication of skin graft 03/16/2017  . Squamous cell carcinoma, leg, right 01/25/2017  . Large vessel vasculitis ([place]) 06/10/2015  . H/O drug therapy 12/31/2013  . Hyperlipidemia 11/04/2013  . Delayed effect of radiation 05/06/2013  . Depression 04/01/2013  . Male sexual dysfunction 01/20/2013  . Fatigue 10/22/2012  . Hypothyroidism 07/23/2012  . History of prostate cancer   . Essential hypertension   . Tonsillar cancer ([place])   . Binocular vision disorder with diplopia 07/10/2012  . PLMD (periodic limb movement disorder) 09/13/2011  . OSA (obstructive sleep apnea) 09/13/2011  . Actinic keratosis 01/28/2011  . Vitamin B 12 deficiency 01/28/2011  . Spondylosis 08/02/2010  . Insomnia 08/02/2010    Ander Purpura, PT 02/19/2019, 12:04 PM  [place], [place], [address] Phone: [phone]   Fax:  [phone]  Name: TAGEN NIX MRN: [id number] Date of Birth: Jul 01, 1932

## 2019-02-21 ENCOUNTER — Ambulatory Visit: Payer: Medicare Other

## 2019-02-21 ENCOUNTER — Other Ambulatory Visit: Payer: Self-pay

## 2019-02-21 DIAGNOSIS — R2689 Other abnormalities of gait and mobility: Secondary | ICD-10-CM

## 2019-02-21 DIAGNOSIS — I890 Lymphedema, not elsewhere classified: Secondary | ICD-10-CM

## 2019-02-21 NOTE — Patient Instructions (Signed)

## 2019-02-21 NOTE — Therapy (Signed)
[place], [place], [address] Phone: [phone]   Fax:  [phone]  Physical Therapy Treatment  Patient Details  Name: Philip [name] MRN: [id number] Date of Birth: [birth year] Referring Provider (PT): Hollace Kinnier DO   Encounter Date: 02/21/2019  PT End of Session - 02/21/19 1102    Visit Number  7    Number of Visits  9    Date for PT Re-Evaluation  03/08/19    PT Start Time  1100    PT Stop Time  1154    PT Time Calculation (min)  54 min    Activity Tolerance  Patient tolerated treatment well    Behavior During Therapy  [facility] for tasks assessed/performed       Past Medical History:  Diagnosis Date  . Aneurysm of left renal artery (HCC)    per CT 08-20-2015 stable 44mm  . Aortitis syndrome ([place]) rheumotologist-  dr Charlestine Night   IgG4 syndrome-- treatment prednisone- (effects abdomine)  . Basal cell carcinoma   . Bladder neck contracture   . BPH (benign prostatic hyperplasia)   . CKD (chronic kidney disease), stage III   . Complication of anesthesia    post op acute urinary retention  . DDD (degenerative disc disease)   . Foley catheter in place    placed 02-29-2019 secondary to urinary retention post surgery  . Full dentures   . GERD (gastroesophageal reflux disease)   . History of external beam radiation therapy 2002   prostate cancer  and boost with radioative prostate seed implants  . History of prostate cancer DX  2002   S/P EXTERNAL RADIATION/ RADIOACTIVE SEED IMPLANTS  2003  . History of squamous cell carcinoma excision    2012--  RIGHT LOWER EXTREM;  2019 LEFT LOWER EXTREMITIY  . HTN (hypertension)   . Hyperlipemia   . Hypothyroidism    endocrinologist-  dr Chalmers Cater--- per pt takes his own thyroid med. called NP Thyroid 10 mg daily, does not take synthroid  . Large vessel vasculitis (HCC)    aortitis--- rheumotologist-  dr Charlestine Night  . Left renal artery stenosis (HCC)    proximal per  CT 08-20-2015 but patent  . Left renal atrophy   . Macular degeneration   . Nocturia   . OSA on CPAP    pt retested 11-13-2016 at [facility] w/ dr ather-- moderate to severe OSA , AHI 16.4/h/  03-24-2017 per pt is consistant using every night  . Pulmonary nodule    per CT 02-28-2017 in [place] in epic done at [place],  LUL nodule , not mets  . Renal cyst, right   . Restless legs syndrome (RLS) 10/31/2011  . S/P radiation therapy     for tonsillar cancer (head and neck) completed 01-26-2012 at [facility])  . Saliva decreased   . Sarcoma of lower extremity, right ([place]) oncolgoist-  dr Juluis Rainier ([place])   dx 02-14-2017 w/ needle core bx;  high grade pleomorphic spindle cell sarcoma , grade 2 (cT2N0M0); 03-15-2017  radical resection sarcoma tumor right lower leg  and plan radiation  . Self-catheterizes urinary bladder    QID   AND   PRN  . Tonsillar cancer ([place]) 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+) (Stage IVA)---- dx oct 2013  ----s/p concurent chemo and radiation/  ended 01-26-2012-- no surgical intervention---  residuals ( dry mouth, decreased saliva)   oncologist at [place]--  dr brizel--  HX OF --  NO RECURRENCE  . Urethral stricture    chronic---  post urethral dilation's  . Urinary retention with incomplete bladder emptying   . Vitamin B 12 deficiency 01/28/2011  . Vitamin D deficiency     Past Surgical History:  Procedure Laterality Date  . BALLOON DILATION N/A 11/10/2014   Procedure: CYSTO BALLOON DILATION AND RETROGRADE URETHROGRAM ;  Surgeon: Bjorn Loser, MD;  Location: [facility];  Service: Urology;  Laterality: N/A;  . CATARACT EXTRACTION W/ INTRAOCULAR LENS  IMPLANT, BILATERAL    . CYSTO/ BALLOON DILATION OF  URETHRAL STRICTURE  12-25-2010  . CYSTOSCOPY WITH RETROGRADE URETHROGRAM N/A 03/30/2016   Procedure: CYSTOSCOPY WITH RETROGRADE URETHROGRAM AND BALLOON DILATION with cystogram;  Surgeon: Bjorn Loser, MD;   Location[facility];  Service: Urology;  Laterality: N/A;  . CYSTOSCOPY WITH URETHRAL DILATATION  05/31/2011   Procedure: CYSTOSCOPY WITH URETHRAL DILATATION;  Surgeon: Reece Packer, MD;  Location: [place];  Service: Urology;  Laterality: N/A;  BALLOON DILATION  . CYSTOSCOPY WITH URETHRAL DILATATION N/A 09/13/2016   Procedure: CYSTOSCOPY WITH URETHRAL BALLOON DILATATION;  Surgeon: Bjorn Loser, MD;  Location: [place];  Service: Urology;  Laterality: N/A;  . CYSTOSCOPY WITH URETHRAL DILATATION N/A 03/30/2017   Procedure: CYSTOSCOPY WITH BALLOON URETHRAL DILATATION;  Surgeon: Bjorn Loser, MD;  Location: [place];  Service: Urology;  Laterality: N/A;  . CYSTOSCOPY/RETROGRADE/URETEROSCOPY N/A 05/22/2012   Procedure: CYSTOSCOPY BALLOON DILATION RETROGRADE URETEROGRAM ;  Surgeon: Reece Packer, MD;  Location: [place];  Service: Urology;  Laterality: N/A;  . CYTSO/  DILATATION URETHRAL STRICTURE/  BX PROSTATIC URETHRA/  REMOVAL FOREIGN BODIES  06-25-2010   [facility]  . INGUINAL HERNIA REPAIR Right 2000  . MOHS SURGERY  01/ 2019    [facility]   left lower leg for SCC  . RADICAL RESECTION OF SARCOMA TUMOR   03-15-2017   [facility]   RIGHT LOWER LEG , CALF AREA  . RADIOACTIVE SEED IMPLANTS, PROSTATE  JAN  2003  . SKIN LESION EXCISION  07/2012   MOST  right shoulder    There were no vitals filed for this visit.  Subjective Assessment - 02/21/19 1102    Subjective  Pt reports that his little toe was bothering him then he moved it and adjusted his wrap; that settled down. Then the big toe started to bother him so he moved it and adjusted the wrap then that settled down. He states that since that time he has not had difficulty with the wrap. He states that he slept well without pain last night.    Pertinent History  Hx Prostate cancer and R lower leg sarcoma    How long can you walk comfortably?  Pt states that his  legs get achy and he feels winded after walking about 40 minutes    Diagnostic tests  MRI, CT scan and doppler without significant findings.    Patient Stated Goals  I want to get this swelling down and work on my endurance.    Currently in Pain?  No/denies    Multiple Pain Sites  No            LYMPHEDEMA/ONCOLOGY QUESTIONNAIRE - 02/21/19 1115      Left Lower Extremity Lymphedema   30 cm Proximal to Suprapatella  55 cm    20 cm Proximal to Suprapatella  47 cm    10 cm Proximal to Suprapatella  44.2 cm    At  Midpatella/Popliteal Crease  39 cm    30 cm Proximal to Floor at Lateral Plantar Foot  32.2 cm    20 cm Proximal to Floor at Lateral Plantar Foot  24.3 cm    10 cm Proximal to Floor at Lateral Malleoli  25 cm    5 cm Proximal to 1st MTP Joint  23 cm    Around Proximal Great Toe  8.5 cm           [facility]  Lymphedema Life Impact Scale Total Score  17.65 %           [facility] Adult PT Treatment/Exercise - 02/21/19 0001      Manual Therapy   Manual Therapy  Compression Bandaging    Manual therapy comments  Pt let was washed with soap/warm water and rinsed with lotion applicaion after removal of wraps and prior to measurements.     Edema Management  circumferential measurements were taken this session.     Compression Bandaging  TG soft for base layer, 1 inch elastomull at toes digits 1-5, 3 artiflex from foot to groin, 1 8 cm at the foot for roman sandal, 1 10 cm for ankle sole heel then spiral wrap, 1 10 cm for spiral to below the knee, 2 12 cm for the knee and the thigh to the groin. TG soft was placed so that it was completely covered at the base of the toes by short stretch bandages and then tape applid on the bottom/top of the foot toprevent rolling of the TG soft for ease of shoe donnine.              PT Education - 02/21/19 1153    Education Details  Pt was provided with written handout  for bandage care and removal. Re-iterated education on reasons to remove the wrap.    Person(s) Educated  Patient    Methods  Explanation;Handout    Comprehension  Verbalized understanding       PT Short Term Goals - 02/01/19 1007      PT SHORT TERM GOAL #1   Title  Pt will be independent with self MLD and HEP within 2 weeks in order to demonstrate autonomy of care.    Baseline  Pt did not have an HEP and is unaware of how to perform MLD.    Time  2    Period  Weeks    Status  New    Target Date  02/22/19        PT Long Term Goals - 02/12/19 1208      Additional Long Term Goals   Additional Long Term Goals  Yes      PT LONG TERM GOAL #6   Title  Pt will rate himself walking for 6 minutes on the bike at level 4 at 5-6 RPE within 4 weeks to demonstrate improved endurance.    Baseline  currently pt is 5-6 RPE after 6 min on the bike at level 3 after 2 min and at level 2 after 6 min.    Time  4    Period  Weeks    Status  New    Target Date  03/12/19            Plan - 02/21/19 1102    Clinical Impression Statement  Pt demonstrates significant reduction with circumferential measurements this session except at the proximal thigh where the wraps had slid down. Discussed the importance  of compression shorts to prevent edema in the testicles and to prevent slippage of bandages. Pt was provided with hand out and re-iterated education on reasons to remove the wrap. Discussed lymphedema stages with patient and on pt request re-iterated education on anatomy/physiology of the lymphatic system. Pt will benefit from continud POC at this time.    Personal Factors and Comorbidities  Comorbidity 2    Comorbidities  Hx prostate cancer and R lower leg sarcoma    Rehab Potential  Good    PT Frequency  2x / week    PT Duration  4 weeks    PT Treatment/Interventions  Functional mobility training;Therapeutic activities;Therapeutic exercise;Neuromuscular re-education;Patient/family  education;Manual techniques    PT Next Visit Plan  measure, assess wrap, assess HEP, progress endurance exercises, assess accuracy with MLD    PT Home Exercise Plan  Access Code: [email or web address]    Consulted and Agree with Plan of Care  Patient       Patient will benefit from skilled therapeutic intervention in order to improve the following deficits and impairments:  Decreased endurance, Increased edema  Visit Diagnosis: Lymphedema, not elsewhere classified  Other abnormalities of gait and mobility     Problem List Patient Active Problem List   Diagnosis Date Noted  . Major depressive disorder with single episode, in partial remission ([place]) 05/04/2018  . Atherosclerosis of aorta ([place]) 05/04/2018  . Thoracic aortic aneurysm without rupture ([place]) 05/04/2018  . Urethral stricture in male 12/20/2017  . Pulmonary nodules/lesions, multiple 12/20/2017  . Atherosclerosis of renal artery ([place]) 07/05/2017  . Chronic kidney disease, stage 3 03/21/2017  . Complication of skin graft 03/16/2017  . Squamous cell carcinoma, leg, right 01/25/2017  . Large vessel vasculitis ([place]) 06/10/2015  . H/O drug therapy 12/31/2013  . Hyperlipidemia 11/04/2013  . Delayed effect of radiation 05/06/2013  . Depression 04/01/2013  . Male sexual dysfunction 01/20/2013  . Fatigue 10/22/2012  . Hypothyroidism 07/23/2012  . History of prostate cancer   . Essential hypertension   . Tonsillar cancer ([place])   . Binocular vision disorder with diplopia 07/10/2012  . PLMD (periodic limb movement disorder) 09/13/2011  . OSA (obstructive sleep apnea) 09/13/2011  . Actinic keratosis 01/28/2011  . Vitamin B 12 deficiency 01/28/2011  . Spondylosis 08/02/2010  . Insomnia 08/02/2010    Gretel Acre Alilah Mcmeans,PT 02/21/2019, 12:03 PM  [place], [place], [address] Phone: [phone]   Fax:  [phone]  Name: Philip [name] MRN: [id number] Date  of Birth: 05-Sep-1932

## 2019-02-25 ENCOUNTER — Other Ambulatory Visit: Payer: Self-pay

## 2019-02-25 ENCOUNTER — Ambulatory Visit: Payer: Medicare Other

## 2019-02-25 DIAGNOSIS — I890 Lymphedema, not elsewhere classified: Secondary | ICD-10-CM

## 2019-02-25 DIAGNOSIS — R2689 Other abnormalities of gait and mobility: Secondary | ICD-10-CM

## 2019-02-25 NOTE — Therapy (Signed)
[place], [place], [address] Phone: [phone]   Fax:  [phone]  Physical Therapy Treatment  Patient Details  Name: Philip [name] MRN: [id number] Date of Birth: April 30, 1932 Referring Provider (PT): Hollace Kinnier DO   Encounter Date: 02/25/2019  PT End of Session - 02/25/19 1111    Visit Number  8    Number of Visits  9    Date for PT Re-Evaluation  03/08/19    PT Start Time  1107    PT Stop Time  1200    PT Time Calculation (min)  53 min    Activity Tolerance  Patient tolerated treatment well    Behavior During Therapy  [facility] for tasks assessed/performed       Past Medical History:  Diagnosis Date  . Aneurysm of left renal artery (HCC)    per CT 08-20-2015 stable 59mm  . 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 Basal cell carcinoma   . Bladder neck contracture   . BPH (benign prostatic hyperplasia)   . CKD (chronic kidney disease), stage III   . Complication of anesthesia    post op acute urinary retention  . DDD (degenerative disc disease)   . Foley catheter in place    placed 02-29-2019 secondary to urinary retention post surgery  . Full dentures   . GERD (gastroesophageal reflux disease)   . History of external beam radiation therapy 2002   prostate cancer  and boost with radioative prostate seed implants  . History of prostate cancer DX  2002   S/P EXTERNAL RADIATION/ RADIOACTIVE SEED IMPLANTS  2003  . History of squamous cell carcinoma excision    2012--  RIGHT LOWER EXTREM;  2019 LEFT LOWER EXTREMITIY  . HTN (hypertension)   . Hyperlipemia   . Hypothyroidism    endocrinologist-  dr Chalmers Cater--- per pt takes his own thyroid med. called NP Thyroid 10 mg daily, does not take synthroid  . Large vessel vasculitis (HCC)    aortitis--- rheumotologist-  dr Charlestine Night  . Left renal artery stenosis (HCC)    proximal per  CT 08-20-2015 but patent  . Left renal atrophy   . Macular degeneration   . Nocturia   . OSA on CPAP    pt retested 11-13-2016 at [facility] w/ dr ather-- moderate to severe OSA , AHI 16.4/h/  03-24-2017 per pt is consistant using every night  . Pulmonary nodule    per CT 02-28-2017 in [place] in epic done at [place],  LUL nodule , not mets  . Renal cyst, right   . Restless legs syndrome (RLS) 10/31/2011  . S/P radiation therapy     for tonsillar cancer (head and neck) completed 01-26-2012 at [facility])  . Saliva decreased   . Sarcoma of lower extremity, right ([place]) oncolgoist-  dr Juluis Rainier ([place])   dx 02-14-2017 w/ needle core bx;  high grade pleomorphic spindle cell sarcoma , grade 2 (cT2N0M0); 03-15-2017  radical resection sarcoma tumor right lower leg  and plan radiation  . Self-catheterizes urinary bladder    QID   AND   PRN  . 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 Urethral stricture    chronic---  post urethral dilation's  . Urinary retention with incomplete bladder emptying   . Vitamin B 12 deficiency 01/28/2011  . Vitamin D deficiency     Past Surgical History:  Procedure Laterality Date  . BALLOON DILATION N/A 11/10/2014   Procedure: CYSTO BALLOON DILATION AND RETROGRADE URETHROGRAM ;  Surgeon: Bjorn Loser, MD;  Location: [facility];  Service: Urology;  Laterality: N/A;  . CATARACT EXTRACTION W/ INTRAOCULAR LENS  IMPLANT, BILATERAL    . CYSTO/ BALLOON DILATION OF  URETHRAL STRICTURE  12-25-2010  . CYSTOSCOPY WITH RETROGRADE URETHROGRAM N/A 03/30/2016   Procedure: CYSTOSCOPY WITH RETROGRADE URETHROGRAM AND BALLOON DILATION with cystogram;  Surgeon: Bjorn Loser, MD;   Location: [facility];  Service: Urology;  Laterality: N/A;  . CYSTOSCOPY WITH URETHRAL DILATATION  05/31/2011   Procedure: CYSTOSCOPY WITH URETHRAL DILATATION;  Surgeon: Reece Packer, MD;  Location: [place];  Service: Urology;  Laterality: N/A;  BALLOON DILATION  . CYSTOSCOPY WITH URETHRAL DILATATION N/A 09/13/2016   Procedure: CYSTOSCOPY WITH URETHRAL BALLOON DILATATION;  Surgeon: Bjorn Loser, MD;  Location: [place];  Service: Urology;  Laterality: N/A;  . CYSTOSCOPY WITH URETHRAL DILATATION N/A 03/30/2017   Procedure: CYSTOSCOPY WITH BALLOON URETHRAL DILATATION;  Surgeon: Bjorn Loser, MD;  Location: [place];  Service: Urology;  Laterality: N/A;  . CYSTOSCOPY/RETROGRADE/URETEROSCOPY N/A 05/22/2012   Procedure: CYSTOSCOPY BALLOON DILATION RETROGRADE URETEROGRAM ;  Surgeon: Reece Packer, MD;  Location: [place];  Service: Urology;  Laterality: N/A;  . CYTSO/  DILATATION URETHRAL STRICTURE/  BX PROSTATIC URETHRA/  REMOVAL FOREIGN BODIES  06-25-2010   [facility]  . INGUINAL HERNIA REPAIR Right 2000  . MOHS SURGERY  01/ 2019    [facility]   left lower leg for SCC  . RADICAL RESECTION OF SARCOMA TUMOR   03-15-2017   [facility]   RIGHT LOWER LEG , CALF AREA  . RADIOACTIVE SEED IMPLANTS, PROSTATE  JAN  2003  . SKIN LESION EXCISION  07/2012   MOST  right shoulder    There were no vitals filed for this visit.  Subjective Assessment - 02/25/19 1111    Subjective  Pt states that he sees 2 doctors tomorrow but he has been very fatigued the past 2 weeks. He states that his swelling is down by the measurements that he has been taking by 3/4 of an inch. Pt reports he took his wrap off this morning.    Pertinent History  Hx Prostate cancer and R lower leg sarcoma    How long can you walk comfortably?  Pt states that his legs get achy and he feels winded after walking about 40 minutes    Diagnostic tests  MRI, CT scan  and doppler without significant findings.    Patient Stated Goals  I want to get this swelling down and work on my endurance.    Currently in Pain?  No/denies    Multiple Pain Sites  No                  Outpatient Re[facility]01/[facility]  Lymphedema Life Impact Scale Total Score  17.65 %           [facility] Adult PT Treatment/Exercise - 02/25/19 0001      Manual Therapy   Manual Therapy  Compression Bandaging;Manual Lymphatic Drainage (MLD)    Manual therapy comments  Pt  was educated to look for any increased edema due to toes were not wraped this session to prevent skin integrity issues at the dorsum of the foot.     Manual Lymphatic Drainage (MLD)  in supine: 5 diaphragmatic breathes, L axillary and Bil inguinal nodes, L inguino-axillary anastomosis, figure 7 toward L axillo-inguinal anastomosis on anterior trunk lateral L thigh, medial to lateral thigh, lateral L thigh, posterior knee, all surfaces of lower leg, Bil malleoli, dorsum of the foot , 5 diaphragmatic breathes. Re-worked all surfaces. Pt leg was elevated throughout.     Compression Bandaging  TG soft for base layer, 1 inch elastomull at toes digits 1-5 not used this session due to red area on dorsum of the foot, 3 artiflex from foot to groin, 1 8 cm at the foot for roman sandal, 1 10 cm for ankle sole heel then spiral wrap, 1 10 cm for spiral to below the knee, 2 12 cm for the knee and the thigh to the groin. TG soft was placed so that it was completely covered at the base of the toes by short stretch bandages             PT Education - 02/25/19 1155    Education Details  Re-iterated reasons to remove bandage. Pt was able to name 2 including pain/change sensation prior to re-iteration of education. Pt educated on reasons to remove wrap in relationship to no toe wraps this session. He will remove the wrap immediately if he notices any swelling in the toes, aching or  pain in order to prevent formation of blisters. He will immediately remove the wrap and don nhis compression garment. Re-iterated this education at the end of session.    Person(s) Educated  Patient    Methods  Explanation    Comprehension  Verbalized understanding       PT Short Term Goals - 02/01/19 1007      PT SHORT TERM GOAL #1   Title  Pt will be independent with self MLD and HEP within 2 weeks in order to demonstrate autonomy of care.    Baseline  Pt did not have an HEP and is unaware of how to perform MLD.    Time  2    Period  Weeks    Status  New    Target Date  02/22/19        PT Long Term Goals - 02/12/19 1208      Additional Long Term Goals   Additional Long Term Goals  Yes      PT LONG TERM GOAL #6   Title  Pt will rate himself walking for 6 minutes on the bike at level 4 at 5-6 RPE within 4 weeks to demonstrate improved endurance.    Baseline  currently pt is 5-6 RPE after 6 min on the bike at level 3 after 2 min and at level 2 after 6 min.    Time  4    Period  Weeks    Status  New    Target Date  03/12/19            Plan - 02/25/19 1110    Clinical Impression Statement  Pt continues to reduce; he reports that his home measurements have improved as well. Pt was educated to get some compression shorts due to possible congest in the lower quadrant to prevent fluid build up in the testicles and lower quadrant. MLD was performed this session for the LLE in supine with  the leg elevated and significant reduction at the Bil malleoli with MLD in elevation. 1/4 inch gray foam was used at Bil malleoli in order to decrease edema at the ankle. Toe bandages were not used this session due to red area on the dorsum of the foot that was tender to touch right were the toe wraps normally fall. Extra padding was added and toe wraps were not donned this session with extra time spent educating pt on removing wrap in relationship to toes. Pt was wrapped with layered compression  wrap this session. Pt will benefit from continued POC at this time.    Personal Factors and Comorbidities  Comorbidity 2    Comorbidities  Hx prostate cancer and R lower leg sarcoma    Stability/Clinical Decision Making  Stable/Uncomplicated    PT Frequency  2x / week    PT Duration  4 weeks    PT Treatment/Interventions  Functional mobility training;Therapeutic activities;Therapeutic exercise;Neuromuscular re-education;Patient/family education;Manual techniques    PT Next Visit Plan  sign him up to get measured for garment. measure, assess wrap, assess HEP, progress endurance exercises, assess accuracy with MLD    PT Home Exercise Plan  Access Code: [email or web address]    Consulted and Agree with Plan of Care  Patient       Patient will benefit from skilled therapeutic intervention in order to improve the following deficits and impairments:  Decreased endurance, Increased edema  Visit Diagnosis: Lymphedema, not elsewhere classified  Other abnormalities of gait and mobility     Problem List Patient Active Problem List   Diagnosis Date Noted  . Major depressive disorder with single episode, in partial remission ([place]) 05/04/2018  . Atherosclerosis of aorta ([place]) 05/04/2018  . Thoracic aortic aneurysm without rupture ([place]) 05/04/2018  . Urethral stricture in male 12/20/2017  . Pulmonary nodules/lesions, multiple 12/20/2017  . Atherosclerosis of renal artery ([place]) 07/05/2017  . Chronic kidney disease, stage 3 03/21/2017  . Complication of skin graft 03/16/2017  . Squamous cell carcinoma, leg, right 01/25/2017  . Large vessel vasculitis ([place]) 06/10/2015  . H/O drug therapy 12/31/2013  . Hyperlipidemia 11/04/2013  . Delayed effect of radiation 05/06/2013  . Depression 04/01/2013  . Male sexual dysfunction 01/20/2013  . Fatigue 10/22/2012  . Hypothyroidism 07/23/2012  . History of prostate cancer   . Essential hypertension   . Tonsillar cancer ([place])   . Binocular vision disorder with  diplopia 07/10/2012  . PLMD (periodic limb movement disorder) 09/13/2011  . OSA (obstructive sleep apnea) 09/13/2011  . Actinic keratosis 01/28/2011  . Vitamin B 12 deficiency 01/28/2011  . Spondylosis 08/02/2010  . Insomnia 08/02/2010    Ander Purpura, PT 02/25/2019, 12:02 PM  [place], [place], [address] Phone: [phone]   Fax:  [phone]  Name: SHANNEN TOBEN MRN: [id number] Date of Birth: [birth year]

## 2019-02-26 DIAGNOSIS — C099 Malignant neoplasm of tonsil, unspecified: Secondary | ICD-10-CM | POA: Diagnosis not present

## 2019-02-26 DIAGNOSIS — K117 Disturbances of salivary secretion: Secondary | ICD-10-CM | POA: Diagnosis not present

## 2019-02-26 DIAGNOSIS — I6523 Occlusion and stenosis of bilateral carotid arteries: Secondary | ICD-10-CM | POA: Diagnosis not present

## 2019-02-26 DIAGNOSIS — E039 Hypothyroidism, unspecified: Secondary | ICD-10-CM | POA: Diagnosis not present

## 2019-02-26 DIAGNOSIS — R2241 Localized swelling, mass and lump, right lower limb: Secondary | ICD-10-CM | POA: Diagnosis not present

## 2019-02-26 DIAGNOSIS — Z08 Encounter for follow-up examination after completed treatment for malignant neoplasm: Secondary | ICD-10-CM | POA: Diagnosis not present

## 2019-02-26 DIAGNOSIS — Z85818 Personal history of malignant neoplasm of other sites of lip, oral cavity, and pharynx: Secondary | ICD-10-CM | POA: Diagnosis not present

## 2019-02-26 DIAGNOSIS — R5383 Other fatigue: Secondary | ICD-10-CM | POA: Diagnosis not present

## 2019-02-26 DIAGNOSIS — C4921 Malignant neoplasm of connective and soft tissue of right lower limb, including hip: Secondary | ICD-10-CM | POA: Diagnosis not present

## 2019-02-26 DIAGNOSIS — C770 Secondary and unspecified malignant neoplasm of lymph nodes of head, face and neck: Secondary | ICD-10-CM | POA: Diagnosis not present

## 2019-02-26 DIAGNOSIS — Z923 Personal history of irradiation: Secondary | ICD-10-CM | POA: Diagnosis not present

## 2019-02-26 DIAGNOSIS — I890 Lymphedema, not elsewhere classified: Secondary | ICD-10-CM | POA: Diagnosis not present

## 2019-02-27 DIAGNOSIS — Z23 Encounter for immunization: Secondary | ICD-10-CM | POA: Diagnosis not present

## 2019-02-28 ENCOUNTER — Other Ambulatory Visit: Payer: Self-pay

## 2019-02-28 ENCOUNTER — Ambulatory Visit: Payer: Medicare Other

## 2019-02-28 DIAGNOSIS — I890 Lymphedema, not elsewhere classified: Secondary | ICD-10-CM | POA: Diagnosis not present

## 2019-02-28 DIAGNOSIS — R2689 Other abnormalities of gait and mobility: Secondary | ICD-10-CM

## 2019-02-28 NOTE — Therapy (Signed)
[place], [place], [address] Phone: [phone]   Fax:  [phone]  Physical Therapy Progress Note  Progress Note Reporting Period 02/01/19 to 02/28/19  See note below for Objective Data and Assessment of Progress/Goals.       Patient Details  Name: Philip [name] MRN: [id number] Date of Birth: 06-Dec-1932 Referring Provider (PT): Hollace Kinnier DO   Encounter Date: 02/28/2019  PT End of Session - 02/28/19 1011    Visit Number  9    Number of Visits  9    Date for PT Re-Evaluation  03/08/19    PT Start Time  1003    PT Stop Time  1100    PT Time Calculation (min)  57 min    Activity Tolerance  Patient tolerated treatment well    Behavior During Therapy  [facility] for tasks assessed/performed       Past Medical History:  Diagnosis Date  . Aneurysm of left renal artery (HCC)    per CT 08-20-2015 stable 64mm  . 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 Basal cell carcinoma   . Bladder neck contracture   . BPH (benign prostatic hyperplasia)   . CKD (chronic kidney disease), stage III   . Complication of anesthesia    post op acute urinary retention  . DDD (degenerative disc disease)   . Foley catheter in place    placed 02-29-2019 secondary to urinary retention post surgery  . Full dentures   . GERD (gastroesophageal reflux disease)   . History of external beam radiation therapy 2002   prostate cancer  and boost with radioative prostate seed implants  . History of prostate cancer DX  2002   S/P EXTERNAL RADIATION/ RADIOACTIVE SEED IMPLANTS  2003  . History of squamous cell carcinoma excision    2012--  RIGHT LOWER EXTREM;  2019 LEFT LOWER EXTREMITIY  . HTN (hypertension)   . Hyperlipemia   . Hypothyroidism    endocrinologist-  dr Chalmers Cater--- per pt takes his own thyroid med. called NP Thyroid 10 mg daily, does not take  synthroid  . Large vessel vasculitis (HCC)    aortitis--- rheumotologist-  dr Charlestine Night  . Left renal artery stenosis (HCC)    proximal per CT 08-20-2015 but patent  . Left renal atrophy   . Macular degeneration   . Nocturia   . OSA on CPAP    pt retested 11-13-2016 at [facility] w/ dr ather-- moderate to severe OSA , AHI 16.4/h/  03-24-2017 per pt is consistant using every night  . Pulmonary nodule    per CT 02-28-2017 in [place] in epic done at [facility],  LUL nodule , not mets  . Renal cyst, right   . Restless legs syndrome (RLS) 10/31/2011  . S/P radiation therapy     for tonsillar cancer (head and neck) completed 01-26-2012 at [facility])  . Saliva decreased   . Sarcoma of lower extremity, right ([place]) oncolgoist-  dr Juluis Rainier ([place])   dx 02-14-2017 w/ needle core bx;  high grade pleomorphic spindle cell sarcoma , grade 2 (cT2N0M0); 03-15-2017  radical resection sarcoma tumor right lower leg  and plan radiation  . Self-catheterizes urinary bladder    QID   AND   PRN  . Tonsillar cancer ([place]) 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+) (Stage IVA)---- dx oct 2013  ----s/p concurent chemo and  radiation/  ended 01-26-2012-- no surgical intervention---  residuals ( dry mouth, decreased saliva)   oncologist at [place]--  dr brizel--  [place]  . Urethral stricture    chronic---  post urethral dilation's  . Urinary retention with incomplete bladder emptying   . Vitamin B 12 deficiency 01/28/2011  . Vitamin D deficiency     Past Surgical History:  Procedure Laterality Date  . BALLOON DILATION N/A 11/10/2014   Procedure: CYSTO BALLOON DILATION AND RETROGRADE URETHROGRAM ;  Surgeon: Bjorn Loser, MD;  Location[facility];  Service: Urology;  Laterality: N/A;  . CATARACT EXTRACTION W/ INTRAOCULAR LENS  IMPLANT, BILATERAL    . CYSTO/ BALLOON DILATION OF  URETHRAL STRICTURE  12-25-2010  . CYSTOSCOPY WITH RETROGRADE  URETHROGRAM N/A 03/30/2016   Procedure: CYSTOSCOPY WITH RETROGRADE URETHROGRAM AND BALLOON DILATION with cystogram;  Surgeon: Bjorn Loser, MD;  Location: [facility];  Service: Urology;  Laterality: N/A;  . CYSTOSCOPY WITH URETHRAL DILATATION  05/31/2011   Procedure: CYSTOSCOPY WITH URETHRAL DILATATION;  Surgeon: Reece Packer, MD;  Location: [facility];  Service: Urology;  Laterality: N/A;  BALLOON DILATION  . CYSTOSCOPY WITH URETHRAL DILATATION N/A 09/13/2016   Procedure: CYSTOSCOPY WITH URETHRAL BALLOON DILATATION;  Surgeon: Bjorn Loser, MD;  Location: [place];  Service: Urology;  Laterality: N/A;  . CYSTOSCOPY WITH URETHRAL DILATATION N/A 03/30/2017   Procedure: CYSTOSCOPY WITH BALLOON URETHRAL DILATATION;  Surgeon: Bjorn Loser, MD;  Location: [place];  Service: Urology;  Laterality: N/A;  . CYSTOSCOPY/RETROGRADE/URETEROSCOPY N/A 05/22/2012   Procedure: CYSTOSCOPY BALLOON DILATION RETROGRADE URETEROGRAM ;  Surgeon: Reece Packer, MD;  Location: [place];  Service: Urology;  Laterality: N/A;  . CYTSO/  DILATATION URETHRAL STRICTURE/  BX PROSTATIC URETHRA/  REMOVAL FOREIGN BODIES  06-25-2010   [facility]  . INGUINAL HERNIA REPAIR Right 2000  . MOHS SURGERY  01/ 2019    [facility]   left lower leg for SCC  . RADICAL RESECTION OF SARCOMA TUMOR   03-15-2017   [facility]   RIGHT LOWER LEG , CALF AREA  . RADIOACTIVE SEED IMPLANTS, PROSTATE  JAN  2003  . SKIN LESION EXCISION  07/2012   MOST  right shoulder    There were no vitals filed for this visit.  Subjective Assessment - 02/28/19 1011    Pertinent History  Hx Prostate cancer and R lower leg sarcoma    How long can you walk comfortably?  Pt states that his legs get achy and he feels winded after walking about 40 minutes    Diagnostic tests  MRI, CT scan and doppler without significant findings.    Patient Stated Goals  I want to get this swelling down  and work on my endurance.    Currently in Pain?  No/denies    Multiple Pain Sites  No            LYMPHEDEMA/ONCOLOGY QUESTIONNAIRE - 02/28/19 1033      Left Lower Extremity Lymphedema   30 cm Proximal to Suprapatella  53 cm    20 cm Proximal to Suprapatella  46.8 cm    10 cm Proximal to Suprapatella  41.9 cm    At Midpatella/Popliteal Crease  38 cm    30 cm Proximal to Floor at Lateral Plantar Foot  32.2 cm    20 cm Proximal to Floor at Lateral Plantar Foot  23.7 cm    10 cm Proximal to Floor  at Lateral Malleoli  24.2 cm    5 cm Proximal to 1st MTP Joint  23 cm    Around Proximal Great Toe  8.9 cm           Outpatient Re[facility]28/[facility]  Lymphedema Life Impact Scale Total Score  11.76 %           [facility] Adult PT Treatment/Exercise - 02/28/19 0001      Manual Therapy   Manual Therapy  Compression Bandaging;Manual Lymphatic Drainage (MLD)    Manual Lymphatic Drainage (MLD)  in supine: 5 diaphragmatic breathes, L axillary and Bil inguinal nodes, L inguino-axillary anastomosis, figure 7 toward L axillo-inguinal anastomosis on anterior trunk lateral L thigh, medial to lateral thigh, lateral L thigh, posterior knee, all surfaces of lower leg, Bil malleoli, dorsum of the foot , 5 diaphragmatic breathes. Re-worked all surfaces. Pt leg was elevated throughout.     Compression Bandaging  TG soft for base layer, 1 inch elastomull at toes digits 1-5 not used this session due to red area on dorsum of the foot and minimal increase in edema at the toe small piece of ABD pad used under TG soft over red area to help decrease pressure/friction here, 3 artiflex from foot to groin, 1/2 ABD pad at BIl ankles to help with edema in this area; 1 8 cm at the foot for roman sandal, 1 10 cm for ankle sole heel then spiral wrap, 1 10 cm for spiral to below the knee, 2 12 cm for the knee and the thigh to the groin. TG soft was placed so that it was  completely covered at the base of the toes by short stretch bandages             PT Education - 02/28/19 1101    Education Details  Pt will continue with MLD at home. He will come tomorrow to be measured for a compression garment. Discussed importance of wearing compression shorts due to definite edema in the L hip.    Person(s) Educated  Patient    Methods  Explanation    Comprehension  Verbalized understanding       PT Short Term Goals - 02/28/19 1020      PT SHORT TERM GOAL #1   Title  Pt will be independent with self MLD and HEP within 2 weeks in order to demonstrate autonomy of care.    Baseline  Pt reports that he does some exercises at home and occasoinally performing self MLD.    Time  4    Period  Weeks    Status  On-going    Target Date  04/04/19        PT Long Term Goals - 02/28/19 1021      PT LONG TERM GOAL #1   Title  Patient will have reduction of L distal thigh girth by 3 cm to demonstrate decreased fluid build up in the L lower extremity.    Baseline  see measurements    Time  4    Period  Weeks    Status  On-going      PT LONG TERM GOAL #2   Title  Patient to be properly fitted with compression garment to wear on daily basis.    Baseline  pt has 20-30 mmHG garment but requires higher compression to control edema.    Time  4    Period  Weeks    Status  On-going  Target Date  04/04/19      PT LONG TERM GOAL #3   Title  Patient will be independent in prevention/self-care management principles including self-massage and long term management plan for edema.    Baseline  Pt is more aware of how to manage his lymphedema but still has questions.    Time  4    Period  Weeks    Status  On-going    Target Date  04/04/19      PT LONG TERM GOAL #4   Title  Pt endurance goals will be set following 6 minute walk test.    Baseline  see below      PT LONG TERM GOAL #5   Title  Pt will improve LLIS score to 10% or less within 4 weeks to demonstrate  impove quality of life with lymphedema    Baseline  11.76    Time  4    Period  Weeks    Status  On-going    Target Date  04/04/19      PT LONG TERM GOAL #6   Title  Pt will rate himself walking for 6 minutes on the bike at level 4 at 5-6 RPE within 4 weeks to demonstrate improved endurance.    Baseline  have not been able to work on this due to wrapping pt leg.    Time  4    Period  Weeks    Status  On-going    Target Date  04/04/19            Plan - 02/28/19 1011    Clinical Impression Statement  Pt presents to physical therapy with improvement in circumferential measurements. He is progressing with all of his goals but continues with edema in the LLE that has progressed into the L hip. Re-iterated education on importance of compression shorts. MLD was performed this session for the LLE in supine with the leg elevated and significant reduction at the Bil malleoli with MLD. ABD pad was used this session over the red area on the dorsum of the foot and at Bil ankles to help with friction and increased edema at malleoli. Toe bandages were not used this session due to no significant increase in edema the toes. Pt will be measured by the fitter for a stronger garment tomorrow; he was educated to bring his 20-30 mmHg garment to wear after he is measured due to he will not be wrapped. Pt will benefit from continued skilled physical therapy services at this time 2x/week for 4 weeks to decrease risk for hospitalization related to immobility and infection secondary to edema.    Personal Factors and Comorbidities  Comorbidity 2    Comorbidities  Hx prostate cancer and R lower leg sarcoma    Rehab Potential  Good    PT Frequency  2x / week    PT Duration  4 weeks    PT Treatment/Interventions  Functional mobility training;Therapeutic activities;Therapeutic exercise;Neuromuscular re-education;Patient/family education;Manual techniques    PT Next Visit Plan  assess wrap, red spoton dorsum of the  foot, assess HEP, progress endurance exercises, assess accuracy with MLD    PT Home Exercise Plan  Access Code: [email or web address]    Consulted and Agree with Plan of Care  Patient       Patient will benefit from skilled therapeutic intervention in order to improve the following deficits and impairments:  Decreased endurance, Increased edema  Visit Diagnosis: Lymphedema, not elsewhere classified  Other abnormalities of gait and  mobility     Problem List Patient Active Problem List   Diagnosis Date Noted  . Major depressive disorder with single episode, in partial remission ([place]) 05/04/2018  . Atherosclerosis of aorta ([place]) 05/04/2018  . Thoracic aortic aneurysm without rupture ([place]) 05/04/2018  . Urethral stricture in male 12/20/2017  . Pulmonary nodules/lesions, multiple 12/20/2017  . Atherosclerosis of renal artery ([place]) 07/05/2017  . Chronic kidney disease, stage 3 03/21/2017  . Complication of skin graft 03/16/2017  . Squamous cell carcinoma, leg, right 01/25/2017  . Large vessel vasculitis ([place]) 06/10/2015  . H/O drug therapy 12/31/2013  . Hyperlipidemia 11/04/2013  . Delayed effect of radiation 05/06/2013  . Depression 04/01/2013  . Male sexual dysfunction 01/20/2013  . Fatigue 10/22/2012  . Hypothyroidism 07/23/2012  . History of prostate cancer   . Essential hypertension   . Tonsillar cancer ([place])   . Binocular vision disorder with diplopia 07/10/2012  . PLMD (periodic limb movement disorder) 09/13/2011  . OSA (obstructive sleep apnea) 09/13/2011  . Actinic keratosis 01/28/2011  . Vitamin B 12 deficiency 01/28/2011  . Spondylosis 08/02/2010  . Insomnia 08/02/2010    Ander Purpura, PT 02/28/2019, 11:06 AM  [place], [place], [address] Phone: [phone]   Fax:  [phone]  Name: MORROW FISS MRN: [id number] Date of Birth: [birth year]

## 2019-03-05 ENCOUNTER — Ambulatory Visit: Payer: Medicare Other | Admitting: Physical Therapy

## 2019-03-05 ENCOUNTER — Encounter: Payer: Self-pay | Admitting: Physical Therapy

## 2019-03-05 ENCOUNTER — Other Ambulatory Visit: Payer: Self-pay

## 2019-03-05 DIAGNOSIS — R2689 Other abnormalities of gait and mobility: Secondary | ICD-10-CM | POA: Diagnosis not present

## 2019-03-05 DIAGNOSIS — I890 Lymphedema, not elsewhere classified: Secondary | ICD-10-CM | POA: Diagnosis not present

## 2019-03-05 NOTE — Therapy (Signed)
Progress Note Reporting Period 02/01/19 to 03/05/19   See note below for Objective Data and Assessment of Progress/Goals.      [place], [place], [address] Phone: [phone]   Fax:  [phone]  Physical Therapy Treatment  Patient Details  Name: Philip [name] MRN: [id number] Date of Birth: 07-06-1932 Referring Provider (PT): Hollace Kinnier DO   Encounter Date: 03/05/2019  PT End of Session - 03/05/19 1654    Visit Number  10    Number of Visits  17    Date for PT Re-Evaluation  04/04/19    PT Start Time  1601    PT Stop Time  1655    PT Time Calculation (min)  54 min    Activity Tolerance  Patient tolerated treatment well    Behavior During Therapy  [facility] for tasks assessed/performed       Past Medical History:  Diagnosis Date  . Aneurysm of left renal artery (HCC)    per CT 08-20-2015 stable 74mm  . Aortitis syndrome ([place]) rheumotologist-  dr Charlestine Night   IgG4 syndrome-- treatment prednisone- (effects abdomine)  . Basal cell carcinoma   . Bladder neck contracture   . BPH (benign prostatic hyperplasia)   . CKD (chronic kidney disease), stage III   . Complication of anesthesia    post op acute urinary retention  . DDD (degenerative disc disease)   . Foley catheter in place    placed 02-29-2019 secondary to urinary retention post surgery  . Full dentures   . GERD (gastroesophageal reflux disease)   . History of external beam radiation therapy 2002   prostate cancer  and boost with radioative prostate seed implants  . History of prostate cancer DX  2002   S/P EXTERNAL RADIATION/ RADIOACTIVE SEED IMPLANTS  2003  . History of squamous cell carcinoma excision    2012--  RIGHT LOWER EXTREM;  2019 LEFT LOWER EXTREMITIY  . HTN (hypertension)   . Hyperlipemia   . Hypothyroidism    endocrinologist-  dr Chalmers Cater--- per pt takes his own thyroid med. called NP Thyroid 10 mg daily, does not take  synthroid  . Large vessel vasculitis (HCC)    aortitis--- rheumotologist-  dr Charlestine Night  . Left renal artery stenosis (HCC)    proximal per CT 08-20-2015 but patent  . Left renal atrophy   . Macular degeneration   . Nocturia   . OSA on CPAP    pt retested 11-13-2016 at [facility]rology w/ dr ather-- moderate to severe OSA , AHI 16.4/h/  03-24-2017 per pt is consistant using every night  . Pulmonary nodule    per CT 02-28-2017 in [place] in epic done at [place],  LUL nodule , not mets  . Renal cyst, right   . Restless legs syndrome (RLS) 10/31/2011  . S/P radiation therapy     for tonsillar cancer (head and neck) completed 01-26-2012 at [facility])  . Saliva decreased   . Sarcoma of lower extremity, right ([place]) oncolgoist-  dr Juluis Rainier ([place])   dx 02-14-2017 w/ needle core bx;  high grade pleomorphic spindle cell sarcoma , grade 2 (cT2N0M0); 03-15-2017  radical resection sarcoma tumor right lower leg  and plan radiation  . Self-catheterizes urinary bladder    QID   AND   PRN  . Tonsillar cancer ([place]) 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+) (Stage IVA)---- dx oct 2013  ----s/p concurent chemo and radiation/  ended 01-26-2012-- no surgical intervention---  residuals ( dry mouth, decreased saliva)   oncologist at [place]--  dr brizel--  [place]  . Urethral stricture    chronic---  post urethral dilation's  . Urinary retention with incomplete bladder emptying   . Vitamin B 12 deficiency 01/28/2011  . Vitamin D deficiency     Past Surgical History:  Procedure Laterality Date  . BALLOON DILATION N/A 11/10/2014   Procedure: CYSTO BALLOON DILATION AND RETROGRADE URETHROGRAM ;  Surgeon: Bjorn Loser, MD;  Location[facility];  Service: Urology;  Laterality: N/A;  . CATARACT EXTRACTION W/ INTRAOCULAR LENS  IMPLANT, BILATERAL    . CYSTO/ BALLOON DILATION OF  URETHRAL STRICTURE  12-25-2010  . CYSTOSCOPY WITH RETROGRADE  URETHROGRAM N/A 03/30/2016   Procedure: CYSTOSCOPY WITH RETROGRADE URETHROGRAM AND BALLOON DILATION with cystogram;  Surgeon: Bjorn Loser, MD;  Location: [facility];  Service: Urology;  Laterality: N/A;  . CYSTOSCOPY WITH URETHRAL DILATATION  05/31/2011   Procedure: CYSTOSCOPY WITH URETHRAL DILATATION;  Surgeon: Reece Packer, MD;  Location: [place];  Service: Urology;  Laterality: N/A;  BALLOON DILATION  . CYSTOSCOPY WITH URETHRAL DILATATION N/A 09/13/2016   Procedure: CYSTOSCOPY WITH URETHRAL BALLOON DILATATION;  Surgeon: Bjorn Loser, MD;  Location: [place];  Service: Urology;  Laterality: N/A;  . CYSTOSCOPY WITH URETHRAL DILATATION N/A 03/30/2017   Procedure: CYSTOSCOPY WITH BALLOON URETHRAL DILATATION;  Surgeon: Bjorn Loser, MD;  Location: [place];  Service: Urology;  Laterality: N/A;  . CYSTOSCOPY/RETROGRADE/URETEROSCOPY N/A 05/22/2012   Procedure: CYSTOSCOPY BALLOON DILATION RETROGRADE URETEROGRAM ;  Surgeon: Reece Packer, MD;  Location: [place];  Service: Urology;  Laterality: N/A;  . CYTSO/  DILATATION URETHRAL STRICTURE/  BX PROSTATIC URETHRA/  REMOVAL FOREIGN BODIES  06-25-2010   [facility]  . INGUINAL HERNIA REPAIR Right 2000  . MOHS SURGERY  01/ 2019    [facility]   left lower leg for SCC  . RADICAL RESECTION OF SARCOMA TUMOR   03-15-2017   [facility]   RIGHT LOWER LEG , CALF AREA  . RADIOACTIVE SEED IMPLANTS, PROSTATE  JAN  2003  . SKIN LESION EXCISION  07/2012   MOST  right shoulder    There were no vitals filed for this visit.  Subjective Assessment - 03/05/19 1615    Subjective  I do not have any bandages. I do not think I have any bandages. I got the bicycle shorts and they work well but the padding does not hide well in my pants.    Pertinent History  Hx Prostate cancer and R lower leg sarcoma    How long can you walk comfortably?  Pt states that his legs get achy and he feels  winded after walking about 40 minutes    Diagnostic tests  MRI, CT scan and doppler without significant findings.    Patient Stated Goals  I want to get this swelling down and work on my endurance.    Currently in Pain?  No/denies    Multiple Pain Sites  No            LYMPHEDEMA/ONCOLOGY QUESTIONNAIRE - 03/05/19 1619      Left Lower Extremity Lymphedema   30 cm Proximal to Suprapatella  56 cm    20 cm Proximal to Suprapatella  52.1 cm    10 cm Proximal to Suprapatella  46.5 cm    At Midpatella/Popliteal Crease  43.5 cm  30 cm Proximal to Floor at Lateral Plantar Foot  34 cm    20 cm Proximal to Floor at Lateral Plantar Foot  25.5 cm    10 cm Proximal to Floor at Lateral Malleoli  27.1 cm    5 cm Proximal to 1st MTP Joint  24 cm    Around Proximal Great Toe  10 cm           [facility]patient [facility]  Lymphedema Life Impact Scale Total Score  11.76 %           [facility] Adult PT Treatment/Exercise - 03/05/19 0001      Manual Therapy   Edema Management  pt forgot bandages so donned compression stocking and compression bike short at end of session- he was going to look in his other car for his bandages when he got home    Manual Lymphatic Drainage (MLD)  in supine: short neck, 5 diaphragmatic breaths, left axillary nodes and establishment of inginoaxillary pathway, left lower extremity working proximal to distal then retracing all steps               PT Short Term Goals - 02/28/19 1020      PT SHORT TERM GOAL #1   Title  Pt will be independent with self MLD and HEP within 2 weeks in order to demonstrate autonomy of care.    Baseline  Pt reports that he does some exercises at home and occasoinally performing self MLD.    Time  4    Period  Weeks    Status  On-going    Target Date  04/04/19        PT Long Term Goals - 02/28/19 1021      PT LONG TERM GOAL #1   Title  Patient will have reduction of L  distal thigh girth by 3 cm to demonstrate decreased fluid build up in the L lower extremity.    Baseline  see measurements    Time  4    Period  Weeks    Status  On-going      PT LONG TERM GOAL #2   Title  Patient to be properly fitted with compression garment to wear on daily basis.    Baseline  pt has 20-30 mmHG garment but requires higher compression to control edema.    Time  4    Period  Weeks    Status  On-going    Target Date  04/04/19      PT LONG TERM GOAL #3   Title  Patient will be independent in prevention/self-care management principles including self-massage and long term management plan for edema.    Baseline  Pt is more aware of how to manage his lymphedema but still has questions.    Time  4    Period  Weeks    Status  On-going    Target Date  04/04/19      PT LONG TERM GOAL #4   Title  Pt endurance goals will be set following 6 minute walk test.    Baseline  see below      PT LONG TERM GOAL #5   Title  Pt will improve LLIS score to 10% or less within 4 weeks to demonstrate impove quality of life with lymphedema    Baseline  11.76    Time  4    Period  Weeks    Status  On-going    Target  Date  04/04/19      PT LONG TERM GOAL #6   Title  Pt will rate himself walking for 6 minutes on the bike at level 4 at 5-6 RPE within 4 weeks to demonstrate improved endurance.    Baseline  have not been able to work on this due to wrapping pt leg.    Time  4    Period  Weeks    Status  On-going    Target Date  04/04/19            Plan - 03/05/19 1655    Clinical Impression Statement  Pt arrived without compression bandages today. Circumferential measurements taken today demosntrate an increase in edema throghout LLE. When first asked about where his bandages were he was unable to recall ever being bandaged. He eventually remembered being bandaged but was not sure where his bandages were. By end of session he believed they were probably left in the car he drove last  Friday. Pt did not have another follow up appointment until next Monday so another set of bandages were not donned today. Next week pt has two appointments so he can be bandaged again next week. He was going to look for his bandages when he got home. He was educated to bring them to next session. Focused on MLD today and donned 20-30 mmHg thigh high with bicycle short donned over thigh portion to help hold them in place and provide additional compression.    PT Frequency  2x / week    PT Duration  4 weeks    PT Treatment/Interventions  Functional mobility training;Therapeutic activities;Therapeutic exercise;Neuromuscular re-education;Patient/family education;Manual techniques    PT Next Visit Plan  see if pt remembered to bring bandages, assess red spot on dorsum of the foot, assess HEP, progress endurance exercises, assess accuracy with MLD    PT Home Exercise Plan  Access Code: [email or web address]    Consulted and Agree with Plan of Care  Patient       Patient will benefit from skilled therapeutic intervention in order to improve the following deficits and impairments:  Decreased endurance, Increased edema  Visit Diagnosis: Lymphedema, not elsewhere classified     Problem List Patient Active Problem List   Diagnosis Date Noted  . Major depressive disorder with single episode, in partial remission ([place]) 05/04/2018  . Atherosclerosis of aorta ([place]) 05/04/2018  . Thoracic aortic aneurysm without rupture ([place]) 05/04/2018  . Urethral stricture in male 12/20/2017  . Pulmonary nodules/lesions, multiple 12/20/2017  . Atherosclerosis of renal artery ([place]) 07/05/2017  . Chronic kidney disease, stage 3 03/21/2017  . Complication of skin graft 03/16/2017  . Squamous cell carcinoma, leg, right 01/25/2017  . Large vessel vasculitis ([place]) 06/10/2015  . H/O drug therapy 12/31/2013  . Hyperlipidemia 11/04/2013  . Delayed effect of radiation 05/06/2013  . Depression 04/01/2013  . Male sexual dysfunction  01/20/2013  . Fatigue 10/22/2012  . Hypothyroidism 07/23/2012  . History of prostate cancer   . Essential hypertension   . Tonsillar cancer ([place])   . Binocular vision disorder with diplopia 07/10/2012  . PLMD (periodic limb movement disorder) 09/13/2011  . OSA (obstructive sleep apnea) 09/13/2011  . Actinic keratosis 01/28/2011  . Vitamin B 12 deficiency 01/28/2011  . Spondylosis 08/02/2010  . Insomnia 08/02/2010    Allyson Sabal [facility] 03/05/2019, 4:59 PM  [place], [place], [address] Phone: [phone]   Fax:  [phone]  Name: Philip [name] MRN:  [id number] Date of Birth: February 19, 1932  Allyson Sabal [place], PT 03/05/19 5:01 PM

## 2019-03-06 DIAGNOSIS — D0462 Carcinoma in situ of skin of left upper limb, including shoulder: Secondary | ICD-10-CM | POA: Diagnosis not present

## 2019-03-06 DIAGNOSIS — L578 Other skin changes due to chronic exposure to nonionizing radiation: Secondary | ICD-10-CM | POA: Diagnosis not present

## 2019-03-06 DIAGNOSIS — Z85828 Personal history of other malignant neoplasm of skin: Secondary | ICD-10-CM | POA: Diagnosis not present

## 2019-03-06 DIAGNOSIS — L570 Actinic keratosis: Secondary | ICD-10-CM | POA: Diagnosis not present

## 2019-03-06 DIAGNOSIS — Z23 Encounter for immunization: Secondary | ICD-10-CM | POA: Diagnosis not present

## 2019-03-06 DIAGNOSIS — D485 Neoplasm of uncertain behavior of skin: Secondary | ICD-10-CM | POA: Diagnosis not present

## 2019-03-06 DIAGNOSIS — C44629 Squamous cell carcinoma of skin of left upper limb, including shoulder: Secondary | ICD-10-CM | POA: Diagnosis not present

## 2019-03-06 DIAGNOSIS — L814 Other melanin hyperpigmentation: Secondary | ICD-10-CM | POA: Diagnosis not present

## 2019-03-06 DIAGNOSIS — L821 Other seborrheic keratosis: Secondary | ICD-10-CM | POA: Diagnosis not present

## 2019-03-06 DIAGNOSIS — D225 Melanocytic nevi of trunk: Secondary | ICD-10-CM | POA: Diagnosis not present

## 2019-03-11 ENCOUNTER — Encounter: Payer: Self-pay | Admitting: Rehabilitation

## 2019-03-11 ENCOUNTER — Ambulatory Visit: Payer: Medicare Other | Admitting: Rehabilitation

## 2019-03-11 ENCOUNTER — Other Ambulatory Visit: Payer: Self-pay

## 2019-03-11 DIAGNOSIS — R2689 Other abnormalities of gait and mobility: Secondary | ICD-10-CM | POA: Diagnosis not present

## 2019-03-11 DIAGNOSIS — I890 Lymphedema, not elsewhere classified: Secondary | ICD-10-CM

## 2019-03-11 NOTE — Therapy (Signed)
[place], [place], [address] Phone: [phone]   Fax:  [phone]  Physical Therapy Treatment  Patient Details  Name: Philip [name] MRN: [id number] Date of Birth: 01-Jul-1932 Referring Provider (PT): Hollace Kinnier DO   Encounter Date: 03/11/2019  PT End of Session - 03/11/19 1343    Visit Number  11    Number of Visits  17    Date for PT Re-Evaluation  04/04/19    PT Start Time  1108    PT Stop Time  1155    PT Time Calculation (min)  47 min       Past Medical History:  Diagnosis Date  . Aneurysm of left renal artery (HCC)    per CT 08-20-2015 stable 70mm  . Aortitis syndrome ([place]) rheumotologist-  dr Charlestine Night   IgG4 syndrome-- treatment prednisone- (effects abdomine)  . Basal cell carcinoma   . Bladder neck contracture   . BPH (benign prostatic hyperplasia)   . CKD (chronic kidney disease), stage III   . Complication of anesthesia    post op acute urinary retention  . DDD (degenerative disc disease)   . Foley catheter in place    placed 02-29-2019 secondary to urinary retention post surgery  . Full dentures   . GERD (gastroesophageal reflux disease)   . History of external beam radiation therapy 2002   prostate cancer  and boost with radioative prostate seed implants  . History of prostate cancer DX  2002   S/P EXTERNAL RADIATION/ RADIOACTIVE SEED IMPLANTS  2003  . History of squamous cell carcinoma excision    2012--  RIGHT LOWER EXTREM;  2019 LEFT LOWER EXTREMITIY  . HTN (hypertension)   . Hyperlipemia   . Hypothyroidism    endocrinologist-  dr Chalmers Cater--- per pt takes his own thyroid med. called NP Thyroid 10 mg daily, does not take synthroid  . Large vessel vasculitis (HCC)    aortitis--- rheumotologist-  dr Charlestine Night  . Left renal artery stenosis (HCC)    proximal per CT 08-20-2015 but patent  . Left renal atrophy   . Macular degeneration   . Nocturia   . OSA on CPAP    pt  retested 11-13-2016 at [facility] w/ dr ather-- moderate to severe OSA , AHI 16.4/h/  03-24-2017 per pt is consistant using every night  . Pulmonary nodule    per CT 02-28-2017 in [place] in epic done at [place],  LUL nodule , not mets  . Renal cyst, right   . Restless legs syndrome (RLS) 10/31/2011  . S/P radiation therapy     for tonsillar cancer (head and neck) completed 01-26-2012 at [facility]  . Saliva decreased   . Sarcoma of lower extremity, right ([place]) oncolgoist-  dr Juluis Rainier ([place])   dx 02-14-2017 w/ needle core bx;  high grade pleomorphic spindle cell sarcoma , grade 2 (cT2N0M0); 03-15-2017  radical resection sarcoma tumor right lower leg  and plan radiation  . Self-catheterizes urinary bladder    QID   AND   PRN  . Tonsillar cancer ([place]) 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+) (Stage IVA)---- dx oct 2013  ----s/p concurent chemo and radiation/  ended 01-26-2012-- no surgical intervention---  residuals ( dry mouth, decreased saliva)   oncologist at [place]--  dr brizel--  HX OF -- NO RECURRENCE  . Urethral stricture    chronic---  post urethral dilation's  . Urinary retention with incomplete  bladder emptying   . Vitamin B 12 deficiency 01/28/2011  . Vitamin D deficiency     Past Surgical History:  Procedure Laterality Date  . BALLOON DILATION N/A 11/10/2014   Procedure: CYSTO BALLOON DILATION AND RETROGRADE URETHROGRAM ;  Surgeon: Bjorn Loser, MD;  Location: [facility];  Service: Urology;  Laterality: N/A;  . CATARACT EXTRACTION W/ INTRAOCULAR LENS  IMPLANT, BILATERAL    . CYSTO/ BALLOON DILATION OF  URETHRAL STRICTURE  12-25-2010  . CYSTOSCOPY WITH RETROGRADE URETHROGRAM N/A 03/30/2016   Procedure: CYSTOSCOPY WITH RETROGRADE URETHROGRAM AND BALLOON DILATION with cystogram;  Surgeon: Bjorn Loser, MD;  [facility];  Service: Urology;  Laterality: N/A;  . CYSTOSCOPY WITH URETHRAL  DILATATION  05/31/2011   Procedure: CYSTOSCOPY WITH URETHRAL DILATATION;  Surgeon: Reece Packer, MD;  Location: [place];  Service: Urology;  Laterality: N/A;  BALLOON DILATION  . CYSTOSCOPY WITH URETHRAL DILATATION N/A 09/13/2016   Procedure: CYSTOSCOPY WITH URETHRAL BALLOON DILATATION;  Surgeon: Bjorn Loser, MD;  Location: [place];  Service: Urology;  Laterality: N/A;  . CYSTOSCOPY WITH URETHRAL DILATATION N/A 03/30/2017   Procedure: CYSTOSCOPY WITH BALLOON URETHRAL DILATATION;  Surgeon: Bjorn Loser, MD;  Location: [place];  Service: Urology;  Laterality: N/A;  . CYSTOSCOPY/RETROGRADE/URETEROSCOPY N/A 05/22/2012   Procedure: CYSTOSCOPY BALLOON DILATION RETROGRADE URETEROGRAM ;  Surgeon: Reece Packer, MD;  Location: [place];  Service: Urology;  Laterality: N/A;  . CYTSO/  DILATATION URETHRAL STRICTURE/  BX PROSTATIC URETHRA/  REMOVAL FOREIGN BODIES  06-25-2010   [facility]  . INGUINAL HERNIA REPAIR Right 2000  . MOHS SURGERY  01/ 2019    [facility]   left lower leg for SCC  . RADICAL RESECTION OF SARCOMA TUMOR   03-15-2017   [facility]   RIGHT LOWER LEG , CALF AREA  . RADIOACTIVE SEED IMPLANTS, PROSTATE  JAN  2003  . SKIN LESION EXCISION  07/2012   MOST  right shoulder    There were no vitals filed for this visit.  Subjective Assessment - 03/11/19 1108    Subjective  I'm tired of all of this.  I was not able to bring my bandages. I have been measuring myself and it has seemed a bit settled around 18.75 inches.  We can put my stocking on today.  I can get it on but it is very hard to get on and off    Pertinent History  Hx Prostate cancer and R lower leg sarcoma    Currently in Pain?  No/denies                  Outpatient [facility]  Lymphedema Life Impact Scale Total Score  11.76 %           [facility] Adult PT Treatment/Exercise - 03/11/19  0001      Manual Therapy   Manual therapy comments  helped pt donn stocking; showed him more compression shorts options on [employer] as he realized he had ordered a padded pair.      Edema Management  pt opted for compression garment donning and will bring his bandages next time.  Discussed setting a reminder or putting them in his car right away    Manual Lymphatic Drainage (MLD)  in supine: short neck, superficial abdominals, 5 diaphragmatic breaths, left axillary nodes and establishment of inginoaxillary pathway, left lower extremity working proximal to distal then retracing all steps.  Focus on some deeper pressure and techniques around the malleoli, dorsum of the foot, and lower leg               PT Short Term Goals - 02/28/19 1020      PT SHORT TERM GOAL #1   Title  Pt will be independent with self MLD and HEP within 2 weeks in order to demonstrate autonomy of care.    Baseline  Pt reports that he does some exercises at home and occasoinally performing self MLD.    Time  4    Period  Weeks    Status  On-going    Target Date  04/04/19        PT Long Term Goals - 02/28/19 1021      PT LONG TERM GOAL #1   Title  Patient will have reduction of L distal thigh girth by 3 cm to demonstrate decreased fluid build up in the L lower extremity.    Baseline  see measurements    Time  4    Period  Weeks    Status  On-going      PT LONG TERM GOAL #2   Title  Patient to be properly fitted with compression garment to wear on daily basis.    Baseline  pt has 20-30 mmHG garment but requires higher compression to control edema.    Time  4    Period  Weeks    Status  On-going    Target Date  04/04/19      PT LONG TERM GOAL #3   Title  Patient will be independent in prevention/self-care management principles including self-massage and long term management plan for edema.    Baseline  Pt is more aware of how to manage his lymphedema but still has questions.    Time  4    Period  Weeks     Status  On-going    Target Date  04/04/19      PT LONG TERM GOAL #4   Title  Pt endurance goals will be set following 6 minute walk test.    Baseline  see below      PT LONG TERM GOAL #5   Title  Pt will improve LLIS score to 10% or less within 4 weeks to demonstrate impove quality of life with lymphedema    Baseline  11.76    Time  4    Period  Weeks    Status  On-going    Target Date  04/04/19      PT LONG TERM GOAL #6   Title  Pt will rate himself walking for 6 minutes on the bike at level 4 at 5-6 RPE within 4 weeks to demonstrate improved endurance.    Baseline  have not been able to work on this due to wrapping pt leg.    Time  4    Period  Weeks    Status  On-going    Target Date  04/04/19            Plan - 03/11/19 1343    Clinical Impression Statement  Pt a bit overwhelmed with health and life situations currently leading to difficulty remembering to bring his garments, do his self care, etc.  Pt did bring his new garment for donning.  Pt reported that he slept in it so we discussed not sleeping in the garments.  Continued with LE MLD.  30-90mmHg garment fits well but a bit hard for him  to donn and doff.  Pt may benefit from butler or similar.    PT Frequency  2x / week    PT Duration  4 weeks    PT Treatment/Interventions  Functional mobility training;Therapeutic activities;Therapeutic exercise;Neuromuscular re-education;Patient/family education;Manual techniques    PT Next Visit Plan  see if pt remembered to bring bandages, assess HEP, progress endurance exercises, assess accuracy with MLD    Consulted and Agree with Plan of Care  Patient       Patient will benefit from skilled therapeutic intervention in order to improve the following deficits and impairments:     Visit Diagnosis: Lymphedema, not elsewhere classified  Other abnormalities of gait and mobility     Problem List Patient Active Problem List   Diagnosis Date Noted  . Major depressive  disorder with single episode, in partial remission ([place]) 05/04/2018  . Atherosclerosis of aorta ([place]) 05/04/2018  . Thoracic aortic aneurysm without rupture ([place]) 05/04/2018  . Urethral stricture in male 12/20/2017  . Pulmonary nodules/lesions, multiple 12/20/2017  . Atherosclerosis of renal artery ([place]) 07/05/2017  . Chronic kidney disease, stage 3 03/21/2017  . Complication of skin graft 03/16/2017  . Squamous cell carcinoma, leg, right 01/25/2017  . Large vessel vasculitis ([place]) 06/10/2015  . H/O drug therapy 12/31/2013  . Hyperlipidemia 11/04/2013  . Delayed effect of radiation 05/06/2013  . Depression 04/01/2013  . Male sexual dysfunction 01/20/2013  . Fatigue 10/22/2012  . Hypothyroidism 07/23/2012  . History of prostate cancer   . Essential hypertension   . Tonsillar cancer ([place])   . Binocular vision disorder with diplopia 07/10/2012  . PLMD (periodic limb movement disorder) 09/13/2011  . OSA (obstructive sleep apnea) 09/13/2011  . Actinic keratosis 01/28/2011  . Vitamin B 12 deficiency 01/28/2011  . Spondylosis 08/02/2010  . Insomnia 08/02/2010    Stark Bray 03/11/2019, 1:46 PM  [place], [place], [address] Phone: [phone]   Fax:  [phone]  Name: CADEL OVERMAN MRN: [id number] Date of Birth: 1932/03/11

## 2019-03-13 ENCOUNTER — Ambulatory Visit: Payer: Medicare Other

## 2019-03-13 ENCOUNTER — Other Ambulatory Visit: Payer: Self-pay

## 2019-03-13 DIAGNOSIS — I890 Lymphedema, not elsewhere classified: Secondary | ICD-10-CM

## 2019-03-13 DIAGNOSIS — R2689 Other abnormalities of gait and mobility: Secondary | ICD-10-CM

## 2019-03-13 NOTE — Therapy (Signed)
[place], [place], [address] Phone: [phone]   Fax:  [phone]  Physical Therapy Treatment  Patient Details  Name: Philip [name] MRN: [id number] Date of Birth: 03-26-1932 Referring Provider (PT): Hollace Kinnier DO   Encounter Date: 03/13/2019  PT End of Session - 03/13/19 1109    Visit Number  12    Number of Visits  17    Date for PT Re-Evaluation  04/04/19    PT Start Time  1107    PT Stop Time  1200    PT Time Calculation (min)  53 min    Activity Tolerance  Patient tolerated treatment well    Behavior During Therapy  [facility] for tasks assessed/performed       Past Medical History:  Diagnosis Date  . Aneurysm of left renal artery (HCC)    per CT 08-20-2015 stable 13mm  . 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 Basal cell carcinoma   . Bladder neck contracture   . BPH (benign prostatic hyperplasia)   . CKD (chronic kidney disease), stage III   . Complication of anesthesia    post op acute urinary retention  . DDD (degenerative disc disease)   . Foley catheter in place    placed 02-29-2019 secondary to urinary retention post surgery  . Full dentures   . GERD (gastroesophageal reflux disease)   . History of external beam radiation therapy 2002   prostate cancer  and boost with radioative prostate seed implants  . History of prostate cancer DX  2002   S/P EXTERNAL RADIATION/ RADIOACTIVE SEED IMPLANTS  2003  . History of squamous cell carcinoma excision    2012--  RIGHT LOWER EXTREM;  2019 LEFT LOWER EXTREMITIY  . HTN (hypertension)   . Hyperlipemia   . Hypothyroidism    endocrinologist-  dr Chalmers Cater--- per pt takes his own thyroid med. called NP Thyroid 10 mg daily, does not take synthroid  . Large vessel vasculitis (HCC)    aortitis--- rheumotologist-  dr Charlestine Night  . Left renal artery stenosis (HCC)    proximal  per CT 08-20-2015 but patent  . Left renal atrophy   . Macular degeneration   . Nocturia   . OSA on CPAP    pt retested 11-13-2016 at [facility] w/ dr ather-- moderate to severe OSA , AHI 16.4/h/  03-24-2017 per pt is consistant using every night  . Pulmonary nodule    per CT 02-28-2017 in [place] in epic done at [facility],  LUL nodule , not mets  . Renal cyst, right   . Restless legs syndrome (RLS) 10/31/2011  . S/P radiation therapy     for tonsillar cancer (head and neck) completed 01-26-2012 at [facility])  . Saliva decreased   . Sarcoma of lower extremity, right ([place]) oncolgoist-  dr Juluis Rainier ([place])   dx 02-14-2017 w/ needle core bx;  high grade pleomorphic spindle cell sarcoma , grade 2 (cT2N0M0); 03-15-2017  radical resection sarcoma tumor right lower leg  and plan radiation  . Self-catheterizes urinary bladder    QID   AND   PRN  . 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 Urethral stricture    chronic---  post urethral dilation's  . Urinary retention with incomplete bladder emptying   . Vitamin B 12 deficiency 01/28/2011  . Vitamin D deficiency     Past Surgical History:  Procedure Laterality Date  . BALLOON DILATION N/A 11/10/2014   Procedure: CYSTO BALLOON DILATION AND RETROGRADE URETHROGRAM ;  Surgeon: Bjorn Loser, MD;  Location: [facility];  Service: Urology;  Laterality: N/A;  . CATARACT EXTRACTION W/ INTRAOCULAR LENS  IMPLANT, BILATERAL    . CYSTO/ BALLOON DILATION OF  URETHRAL STRICTURE  12-25-2010  . CYSTOSCOPY WITH RETROGRADE URETHROGRAM N/A 03/30/2016   Procedure: CYSTOSCOPY WITH RETROGRADE URETHROGRAM AND BALLOON DILATION with cystogram;  Surgeon: Bjorn Loser,  MD;  Location: [facility];  Service: Urology;  Laterality: N/A;  . CYSTOSCOPY WITH URETHRAL DILATATION  05/31/2011   Procedure: CYSTOSCOPY WITH URETHRAL DILATATION;  Surgeon: Reece Packer, MD;  Location: [facility];  Service: Urology;  Laterality: N/A;  BALLOON DILATION  . CYSTOSCOPY WITH URETHRAL DILATATION N/A 09/13/2016   Procedure: CYSTOSCOPY WITH URETHRAL BALLOON DILATATION;  Surgeon: Bjorn Loser, MD;  Location: [facility];  Service: Urology;  Laterality: N/A;  . CYSTOSCOPY WITH URETHRAL DILATATION N/A 03/30/2017   Procedure: CYSTOSCOPY WITH BALLOON URETHRAL DILATATION;  Surgeon: Bjorn Loser, MD;  Location: [facility];  Service: Urology;  Laterality: N/A;  . CYSTOSCOPY/RETROGRADE/URETEROSCOPY N/A 05/22/2012   Procedure: CYSTOSCOPY BALLOON DILATION RETROGRADE URETEROGRAM ;  Surgeon: Reece Packer, MD;  Location: [facility];  Service: Urology;  Laterality: N/A;  . CYTSO/  DILATATION URETHRAL STRICTURE/  BX PROSTATIC URETHRA/  REMOVAL FOREIGN BODIES  06-25-2010   [facility]  . INGUINAL HERNIA REPAIR Right 2000  . MOHS SURGERY  01/ 2019    [facility]   left lower leg for SCC  . RADICAL RESECTION OF SARCOMA TUMOR   03-15-2017   [facility]   RIGHT LOWER LEG , CALF AREA  . RADIOACTIVE SEED IMPLANTS, PROSTATE  JAN  2003  . SKIN LESION EXCISION  07/2012   MOST  right shoulder    There were no vitals filed for this visit.  Subjective Assessment - 03/13/19 1109    Subjective  Pt states that his leg is less swollen today. He had [employer] come to his house the other day and he was able to demonstrate the pump. He got a little confused about the insurance instructions ([employer] was contacted to see if this can be cleared up for patient).    Pertinent History  Hx Prostate cancer and R lower leg sarcoma    Currently in Pain?  No/denies    Multiple Pain Sites  No                  Outpatient Rehab from  [facility]/28/2019 in [facility]  Lymphedema Life Impact Scale Total Score  11.76 %           [facility] Adult PT Treatment/Exercise - 03/13/19 0001      Manual Therapy   Manual Therapy  Manual Lymphatic Drainage (MLD);Compression Bandaging    Manual Lymphatic Drainage (MLD)  in supine: short neck, swimming in the terminus 5 diphragmatic breathes, left axillary nodes/inguinal nodes and establishment of inginoaxillary pathway, left lower extremity working proximal to distal then retracing all steps.  Focus on some deeper pressure and techniques around the malleoli, dorsum of the foot, and lower leg; deep abominals    Compression Bandaging  TG soft for base layer, 1 inch elastomull at toes digits 1-5 not used this session due to minimal increase in edema at the toes, 3 artiflex from foot to groin, 1/2 inch gray foam at posterior/medial L knee near bottle neck due to visual stagnation here; 1 8 cm at the foot for roman sandal and ankle/sole/heel, 2 10 cm for spiral to below the knee, 2 12 cm for the knee and the thigh to the groin. TG soft was placed so that it was completely covered at the base of the toes by short stretch bandages; extra time taken to ensure all air was out of foam.              PT Education - 03/13/19 1159    Education Details  Re-iterated education on when pt should remove wraps including with any numbness/tingling/pain or bunching as well as after 2-3 days then he will don his compression garment until he returns to physical therapy and bring all of his supplies.    Person(s) Educated  Patient    Methods  Explanation    Comprehension  Verbalized understanding       PT Short Term Goals - 02/28/19 1020      PT SHORT TERM GOAL #1   Title  Pt will be independent with self MLD and HEP within 2 weeks in order to demonstrate autonomy of care.    Baseline  Pt reports that he does some exercises at home and occasoinally performing self MLD.    Time   4    Period  Weeks    Status  On-going    Target Date  04/04/19        PT Long Term Goals - 02/28/19 1021      PT LONG TERM GOAL #1   Title  Patient will have reduction of L distal thigh girth by 3 cm to demonstrate decreased fluid build up in the L lower extremity.    Baseline  see measurements    Time  4    Period  Weeks    Status  On-going      PT LONG TERM GOAL #2   Title  Patient to be properly fitted with compression garment to wear on daily basis.    Baseline  pt has 20-30 mmHG garment but requires higher compression to control edema.    Time  4    Period  Weeks    Status  On-going    Target Date  04/04/19      PT LONG TERM GOAL #3   Title  Patient will be independent in prevention/self-care management principles including self-massage and long term management plan for edema.    Baseline  Pt is more aware of how to manage his lymphedema but still has questions.    Time  4    Period  Weeks    Status  On-going    Target Date  04/04/19      PT LONG TERM GOAL #4   Title  Pt endurance goals will be set following 6 minute walk test.    Baseline  see below      PT LONG TERM GOAL #5   Title  Pt will improve LLIS score to 10% or less within 4 weeks to demonstrate impove quality of life with lymphedema    Baseline  11.76    Time  4    Period  Weeks    Status  On-going    Target Date  04/04/19      PT LONG TERM GOAL #6   Title  Pt will rate himself walking for 6 minutes on the bike at level 4 at 5-6 RPE within 4 weeks to demonstrate improved endurance.    Baseline  have not been able to work on this due to wrapping pt leg.    Time  4    Period  Weeks    Status  On-going    Target Date  04/04/19            Plan - 03/13/19 1108    Clinical Impression Statement  Pt presented wearing his garment that he had donned appropriately. Pt was able to recall his conversation with the PTA last session to not sleep in his garment and brought up this conversation  independently when re-iterating education on when to remove wraps and don garment. He did remember his wraps this session. MLD was performed with pt leg in elevation when extra time spent over fibrotic areas. Pt was wrapped using 1/2 inch gray foam at the posterior/medial knee this session due to visual pooling in this area at the knee where the lymphatic system bottle necks. Pt will benefit from continued POC at this time.    Personal Factors and Comorbidities  Comorbidity 2    Comorbidities  Hx prostate cancer and R lower leg sarcoma    Rehab Potential  Good    PT Frequency  2x / week    PT Duration  4 weeks    PT Treatment/Interventions  Functional mobility training;Therapeutic activities;Therapeutic exercise;Neuromuscular re-education;Patient/family education;Manual techniques    PT Next Visit Plan  perform MLD/wrapping and ask if he has heard from [employer] or see if PT has recieved any info, assess HEP, progress endurance exercises, assess accuracy with MLD    PT Home Exercise Plan  Access Code: [email or web address]    Consulted and Agree with Plan of Care  Patient       Patient will benefit from skilled therapeutic intervention in order to improve the following deficits and impairments:  Decreased endurance, Increased edema  Visit Diagnosis: Lymphedema, not elsewhere classified  Other abnormalities of gait and mobility     Problem List Patient Active Problem List   Diagnosis Date Noted  . Major depressive disorder with single episode, in partial remission ([place]) 05/04/2018  . Atherosclerosis of aorta ([place]) 05/04/2018  . Thoracic aortic aneurysm without rupture ([place]) 05/04/2018  . Urethral stricture in male 12/20/2017  . Pulmonary nodules/lesions, multiple 12/20/2017  . Atherosclerosis of renal artery ([place]) 07/05/2017  . Chronic kidney disease, stage 3 03/21/2017  . Complication of skin graft 03/16/2017  . Squamous cell carcinoma, leg, right 01/25/2017  . Large vessel vasculitis  ([place]) 06/10/2015  . H/O drug therapy 12/31/2013  . Hyperlipidemia 11/04/2013  . Delayed effect of radiation 05/06/2013  . Depression 04/01/2013  . Male sexual dysfunction 01/20/2013  . Fatigue 10/22/2012  . Hypothyroidism 07/23/2012  . History of prostate cancer   . Essential hypertension   . Tonsillar cancer ([place])   . Binocular vision disorder with diplopia 07/10/2012  . PLMD (periodic limb movement disorder) 09/13/2011  . OSA (obstructive sleep apnea) 09/13/2011  . Actinic keratosis 01/28/2011  . Vitamin B 12 deficiency 01/28/2011  . Spondylosis 08/02/2010  . Insomnia 08/02/2010    Ander Purpura, PT 03/13/2019, 12:03 PM  [place], [place], [address] Phone: [phone]   Fax:  [phone]  Name: Philip [name] [facility]  MRN: [id number] Date of Birth: 1932/06/04

## 2019-03-18 ENCOUNTER — Ambulatory Visit: Payer: Medicare Other | Attending: Internal Medicine

## 2019-03-18 ENCOUNTER — Other Ambulatory Visit: Payer: Self-pay | Admitting: Internal Medicine

## 2019-03-18 ENCOUNTER — Other Ambulatory Visit: Payer: Self-pay

## 2019-03-18 DIAGNOSIS — H02115 Cicatricial ectropion of left lower eyelid: Secondary | ICD-10-CM | POA: Diagnosis not present

## 2019-03-18 DIAGNOSIS — L905 Scar conditions and fibrosis of skin: Secondary | ICD-10-CM | POA: Diagnosis not present

## 2019-03-18 DIAGNOSIS — R2689 Other abnormalities of gait and mobility: Secondary | ICD-10-CM | POA: Diagnosis not present

## 2019-03-18 DIAGNOSIS — Z09 Encounter for follow-up examination after completed treatment for conditions other than malignant neoplasm: Secondary | ICD-10-CM | POA: Diagnosis not present

## 2019-03-18 DIAGNOSIS — I890 Lymphedema, not elsewhere classified: Secondary | ICD-10-CM

## 2019-03-18 DIAGNOSIS — H11432 Conjunctival hyperemia, left eye: Secondary | ICD-10-CM | POA: Diagnosis not present

## 2019-03-18 NOTE — Therapy (Signed)
[place], [place], [address] Phone: [phone]   Fax:  [phone]  Physical Therapy Treatment  Patient Details  Name: Philip [name] MRN: [id number] Date of Birth: 03/14/1932 Referring Provider (PT): Hollace Kinnier DO   Encounter Date: 03/18/2019  PT End of Session - 03/18/19 1151    Visit Number  13    Number of Visits  17    Date for PT Re-Evaluation  04/04/19    PT Start Time  1106    PT Stop Time  1159    PT Time Calculation (min)  53 min    Activity Tolerance  Patient tolerated treatment well    Behavior During Therapy  [facility] for tasks assessed/performed       Past Medical History:  Diagnosis Date  . Aneurysm of left renal artery (HCC)    per CT 08-20-2015 stable 74mm  . 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 Basal cell carcinoma   . Bladder neck contracture   . BPH (benign prostatic hyperplasia)   . CKD (chronic kidney disease), stage III   . Complication of anesthesia    post op acute urinary retention  . DDD (degenerative disc disease)   . Foley catheter in place    placed 02-29-2019 secondary to urinary retention post surgery  . Full dentures   . GERD (gastroesophageal reflux disease)   . History of external beam radiation therapy 2002   prostate cancer  and boost with radioative prostate seed implants  . History of prostate cancer DX  2002   S/P EXTERNAL RADIATION/ RADIOACTIVE SEED IMPLANTS  2003  . History of squamous cell carcinoma excision    2012--  RIGHT LOWER EXTREM;  2019 LEFT LOWER EXTREMITIY  . HTN (hypertension)   . Hyperlipemia   . Hypothyroidism    endocrinologist-  dr Chalmers Cater--- per pt takes his own thyroid med. called NP Thyroid 10 mg daily, does not take synthroid  . Large vessel vasculitis (HCC)    aortitis--- rheumotologist-  dr Charlestine Night  . Left renal artery stenosis (HCC)    proximal per  CT 08-20-2015 but patent  . Left renal atrophy   . Macular degeneration   . Nocturia   . OSA on CPAP    pt retested 11-13-2016 at [facility] w/ dr ather-- moderate to severe OSA , AHI 16.4/h/  03-24-2017 per pt is consistant using every night  . Pulmonary nodule    per CT 02-28-2017 in [place] in epic done at [place],  LUL nodule , not mets  . Renal cyst, right   . Restless legs syndrome (RLS) 10/31/2011  . S/P radiation therapy     for tonsillar cancer (head and neck) completed 01-26-2012 at [facility])  . Saliva decreased   . Sarcoma of lower extremity, right ([place]) oncolgoist-  dr Juluis Rainier ([place])   dx 02-14-2017 w/ needle core bx;  high grade pleomorphic spindle cell sarcoma , grade 2 (cT2N0M0); 03-15-2017  radical resection sarcoma tumor right lower leg  and plan radiation  . Self-catheterizes urinary bladder    QID   AND   PRN  . 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 Urethral stricture    chronic---  post urethral dilation's  . Urinary retention with incomplete bladder emptying   . Vitamin B 12 deficiency 01/28/2011  . Vitamin D deficiency     Past Surgical History:  Procedure Laterality Date  . BALLOON DILATION N/A 11/10/2014   Procedure: CYSTO BALLOON DILATION AND RETROGRADE URETHROGRAM ;  Surgeon: Bjorn Loser, MD;  Location: [facility];  Service: Urology;  Laterality: N/A;  . CATARACT EXTRACTION W/ INTRAOCULAR LENS  IMPLANT, BILATERAL    . CYSTO/ BALLOON DILATION OF  URETHRAL STRICTURE  12-25-2010  . CYSTOSCOPY WITH RETROGRADE URETHROGRAM N/A 03/30/2016   Procedure: CYSTOSCOPY WITH RETROGRADE URETHROGRAM AND BALLOON DILATION with cystogram;  Surgeon: Bjorn Loser, MD;   Location[facility];  Service: Urology;  Laterality: N/A;  . CYSTOSCOPY WITH URETHRAL DILATATION  05/31/2011   Procedure: CYSTOSCOPY WITH URETHRAL DILATATION;  Surgeon: Reece Packer, MD;  Location: [place];  Service: Urology;  Laterality: N/A;  BALLOON DILATION  . CYSTOSCOPY WITH URETHRAL DILATATION N/A 09/13/2016   Procedure: CYSTOSCOPY WITH URETHRAL BALLOON DILATATION;  Surgeon: Bjorn Loser, MD;  Location: [place];  Service: Urology;  Laterality: N/A;  . CYSTOSCOPY WITH URETHRAL DILATATION N/A 03/30/2017   Procedure: CYSTOSCOPY WITH BALLOON URETHRAL DILATATION;  Surgeon: Bjorn Loser, MD;  [facility];  Service: Urology;  Laterality: N/A;  . CYSTOSCOPY/RETROGRADE/URETEROSCOPY N/A 05/22/2012   Procedure: CYSTOSCOPY BALLOON DILATION RETROGRADE URETEROGRAM ;  Surgeon: Reece Packer, MD;  Location: [place];  Service: Urology;  Laterality: N/A;  . CYTSO/  DILATATION URETHRAL STRICTURE/  BX PROSTATIC URETHRA/  REMOVAL FOREIGN BODIES  06-25-2010   [facility]  . INGUINAL HERNIA REPAIR Right 2000  . MOHS SURGERY  01/ 2019    [facility]   left lower leg for SCC  . RADICAL RESECTION OF SARCOMA TUMOR   03-15-2017   [facility]   RIGHT LOWER LEG , CALF AREA  . RADIOACTIVE SEED IMPLANTS, PROSTATE  JAN  2003  . SKIN LESION EXCISION  07/2012   MOST  right shoulder    There were no vitals filed for this visit.  Subjective Assessment - 03/18/19 1149    Subjective  Pt reports that he has been wearing his leg garment consistently every day. He continues with fluctuations of circumference when measureing at home and has not purchased compression shorts that fit.    Pertinent History  Hx Prostate cancer and R lower leg sarcoma    How long can you walk comfortably?  Pt states that his legs get achy and he feels winded after walking about 40 minutes    Diagnostic tests  MRI, CT scan and doppler without significant  findings.    Currently in Pain?  No/denies    Multiple Pain Sites  No            LYMPHEDEMA/ONCOLOGY QUESTIONNAIRE - 03/18/19 1152      Left Lower Extremity Lymphedema   30 cm Proximal to Suprapatella  50 cm    20 cm Proximal to Suprapatella  46.3 cm    10 cm Proximal to Suprapatella  42.5 cm    At Midpatella/Popliteal Crease  35.8 cm    30 cm Proximal to Floor at Lateral Plantar Foot  30.8 cm    20 cm Proximal to Floor at Lateral Plantar Foot  26.3 cm    10 cm Proximal to Floor at Lateral Malleoli  23.4 cm  5 cm Proximal to 1st MTP Joint  22.4 cm    Around Proximal Great Toe  8.8 cm           Outpatient [facility]28/[facility]  Lymphedema Life Impact Scale Total Score  11.76 %           [facility] Adult PT Treatment/Exercise - 03/18/19 0001      Manual Therapy   Manual Therapy  Manual Lymphatic Drainage (MLD)    Manual Lymphatic Drainage (MLD)  in supine: short neck, swimming in the terminus 5 diphragmatic breathes, left axillary nodes/inguinal nodes and establishment of inginoaxillary pathway, left lower extremity working proximal to distal then retracing all steps.  Focus on some deeper pressure and techniques around the malleoli, dorsum of the foot, and lower leg; deep abominals             PT Education - 03/18/19 1150    Education Details  Discussed the importance of compression shorts and how this will help with congestion at the L thigh. Pt wasprovided with size andpicture of compression shorts to purchase. Re-iterated education on the lymphatic system anatomy/physiology due to pt was asking questions about lymphatic flow in the body.    Person(s) Educated  Patient    Methods  Explanation    Comprehension  Verbalized understanding       PT Short Term Goals - 02/28/19 1020      PT SHORT TERM GOAL #1   Title  Pt will be independent with self MLD and HEP within 2 weeks in order to demonstrate autonomy of  care.    Baseline  Pt reports that he does some exercises at home and occasoinally performing self MLD.    Time  4    Period  Weeks    Status  On-going    Target Date  04/04/19        PT Long Term Goals - 02/28/19 1021      PT LONG TERM GOAL #1   Title  Patient will have reduction of L distal thigh girth by 3 cm to demonstrate decreased fluid build up in the L lower extremity.    Baseline  see measurements    Time  4    Period  Weeks    Status  On-going      PT LONG TERM GOAL #2   Title  Patient to be properly fitted with compression garment to wear on daily basis.    Baseline  pt has 20-30 mmHG garment but requires higher compression to control edema.    Time  4    Period  Weeks    Status  On-going    Target Date  04/04/19      PT LONG TERM GOAL #3   Title  Patient will be independent in prevention/self-care management principles including self-massage and long term management plan for edema.    Baseline  Pt is more aware of how to manage his lymphedema but still has questions.    Time  4    Period  Weeks    Status  On-going    Target Date  04/04/19      PT LONG TERM GOAL #4   Title  Pt endurance goals will be set following 6 minute walk test.    Baseline  see below      PT LONG TERM GOAL #5   Title  Pt will improve LLIS score to 10% or less within 4 weeks to  demonstrate impove quality of life with lymphedema    Baseline  11.76    Time  4    Period  Weeks    Status  On-going    Target Date  04/04/19      PT LONG TERM GOAL #6   Title  Pt will rate himself walking for 6 minutes on the bike at level 4 at 5-6 RPE within 4 weeks to demonstrate improved endurance.    Baseline  have not been able to work on this due to wrapping pt leg.    Time  4    Period  Weeks    Status  On-going    Target Date  04/04/19            Plan - 03/18/19 1152    Clinical Impression Statement  Pt presents to physical therapy wearing his compression garment. His circumferential  measurements have decreased significantly since his initial evaluation. He continues with congestion and some fibrotic tissue in the posterior thigh; discussed compression shorts and pt was provided with information on which ones to order as well as how to size. MLD was performed to the LLE using the L inguino-axillary anastomosis with pt leg in elevation and extra time spent over fibrotic areas. Pt donned compression garment at end of session. Discussed further POC including potentially increasing endurance activities once pt has all the equipment necessary to manage lymphedema at home. Pt will benefit from continued POC at this time.    Personal Factors and Comorbidities  Comorbidity 2    Comorbidities  Hx prostate cancer and R lower leg sarcoma    Rehab Potential  Good    PT Frequency  2x / week    PT Duration  4 weeks    PT Treatment/Interventions  Functional mobility training;Therapeutic activities;Therapeutic exercise;Neuromuscular re-education;Patient/family education;Manual techniques    PT Next Visit Plan  perform MLD and ask if he has heard from [employer] or see if PT has recieved any info, assess HEP, progress endurance exercises, assess accuracy with MLD    PT Home Exercise Plan  Access Code: [email or web address]    Consulted and Agree with Plan of Care  Patient       Patient will benefit from skilled therapeutic intervention in order to improve the following deficits and impairments:  Decreased endurance, Increased edema  Visit Diagnosis: Lymphedema, not elsewhere classified  Other abnormalities of gait and mobility     Problem List Patient Active Problem List   Diagnosis Date Noted  . Major depressive disorder with single episode, in partial remission ([place]) 05/04/2018  . Atherosclerosis of aorta ([place]) 05/04/2018  . Thoracic aortic aneurysm without rupture ([place]) 05/04/2018  . Urethral stricture in male 12/20/2017  . Pulmonary nodules/lesions, multiple 12/20/2017  .  Atherosclerosis of renal artery ([place]) 07/05/2017  . Chronic kidney disease, stage 3 03/21/2017  . Complication of skin graft 03/16/2017  . Squamous cell carcinoma, leg, right 01/25/2017  . Large vessel vasculitis ([place]) 06/10/2015  . H/O drug therapy 12/31/2013  . Hyperlipidemia 11/04/2013  . Delayed effect of radiation 05/06/2013  . Depression 04/01/2013  . Male sexual dysfunction 01/20/2013  . Fatigue 10/22/2012  . Hypothyroidism 07/23/2012  . History of prostate cancer   . Essential hypertension   . Tonsillar cancer ([place])   . Binocular vision disorder with diplopia 07/10/2012  . PLMD (periodic limb movement disorder) 09/13/2011  . OSA (obstructive sleep apnea) 09/13/2011  . Actinic keratosis 01/28/2011  . Vitamin B 12 deficiency 01/28/2011  .  Spondylosis 08/02/2010  . Insomnia 08/02/2010    Ander Purpura, PT 03/18/2019, 12:05 PM  [place], [place], [address] Phone: [phone]   Fax:  [phone]  Name: Philip [name] MRN: [id number] Date of Birth: Jul 18, 1932

## 2019-03-19 ENCOUNTER — Telehealth: Payer: Self-pay

## 2019-03-19 NOTE — Telephone Encounter (Signed)
Message left on clinical intake voicemail:    Philip [name] with [employer] left message stating she spoke with Shelton Silvas yesterday and Shelton Silvas told her she would resend a fax that was previously sent and cut off. Philip [name] has not received the fax as of today and questions if Shelton Silvas resent or if they need to refax to [place] to complete again.    When viewing patients chart I did not see any documentation of Philip [name] conversing with [place]. Shelton Silvas please document within this encounter the interaction/converstion that took place yesterday and what actions were taken.  We will need to follow-up with Philip [name] to give a status update of this inquiry.

## 2019-03-19 NOTE — Telephone Encounter (Signed)
Unable to locate original fax.  Called and got form refaxed.  Labels put on fax and then given to Charlene to take to [facility] for signature/faxing tomorrow.

## 2019-03-20 ENCOUNTER — Non-Acute Institutional Stay: Payer: Medicare Other | Admitting: Internal Medicine

## 2019-03-20 ENCOUNTER — Encounter: Payer: Self-pay | Admitting: Internal Medicine

## 2019-03-20 ENCOUNTER — Ambulatory Visit: Payer: Medicare Other

## 2019-03-20 ENCOUNTER — Other Ambulatory Visit: Payer: Self-pay

## 2019-03-20 VITALS — BP 142/92 | HR 72 | Temp 97.5°F | Ht 71.0 in | Wt 164.0 lb

## 2019-03-20 DIAGNOSIS — E538 Deficiency of other specified B group vitamins: Secondary | ICD-10-CM | POA: Diagnosis not present

## 2019-03-20 DIAGNOSIS — E034 Atrophy of thyroid (acquired): Secondary | ICD-10-CM | POA: Diagnosis not present

## 2019-03-20 DIAGNOSIS — R2689 Other abnormalities of gait and mobility: Secondary | ICD-10-CM

## 2019-03-20 DIAGNOSIS — Z85831 Personal history of malignant neoplasm of soft tissue: Secondary | ICD-10-CM | POA: Diagnosis not present

## 2019-03-20 DIAGNOSIS — M316 Other giant cell arteritis: Secondary | ICD-10-CM | POA: Diagnosis not present

## 2019-03-20 DIAGNOSIS — I890 Lymphedema, not elsewhere classified: Secondary | ICD-10-CM | POA: Diagnosis not present

## 2019-03-20 DIAGNOSIS — R5382 Chronic fatigue, unspecified: Secondary | ICD-10-CM | POA: Diagnosis not present

## 2019-03-20 MED ORDER — CYANOCOBALAMIN 1000 MCG/ML IJ SOLN: 1000.0000 ug | INTRAMUSCULAR | 0 refills | Status: AC

## 2019-03-20 NOTE — Progress Notes (Signed)
Location:  Occupational psychologist of Service:  Clinic (12)  Provider: Thedore Pickel L. Mariea Clonts, D.O., C.M.D.  Code Status: DNR Goals of Care:  Advanced Directives 03/20/2019  Does Patient Have a Medical Advance Directive? Yes  Type of Advance Directive Out of facility DNR (pink MOST or yellow form);Healthcare Power of Attorney  Does patient want to make changes to medical advance directive? No - Patient declined  Copy of [place] in Chart? -  Would patient like information on creating a medical advance directive? -  Pre-existing out of facility DNR order (yellow form or pink MOST form) -   Chief Complaint  Patient presents with  . Medical Management of Chronic Issues    Follow up on Edema of lower left leg     HPI: Patient is a 86 y.o. male with h/o prior prostate ca with radiation and more recent sarcoma with right LE radiation seen today for medical management of chronic diseases--f/u on lymphedema of left leg.  He has been getting regular unnaboot wraps of the leg which has helped.  There were plans to get him a compression device for his leg.   Unfortunately, the swelling will be long-term.  He's going to the lymphedema twice a week.  Right now he has a thigh-high stocking on the left leg.  He has not gotten the compression device yet.  19 inches at first--never got worse than that.  Was down to 18.5 in this am.    Having redness and swelling of lateral aspect of left eyes since surgery there.  He did have a visit again Monday and had some of the redness cauterized and steroid injection was given.  He's had drooping of the left lower lid also so she's supposed to be pushing the lid up and using an ointment.    He's been taking B12.  Level was 230 in Jan of this year.  He'd had a problem with absorption of B12 at first.  He had injections for many years.  He stopped the injections when he moved to [facility].  He'd been taking b12 pills, but he's slacked on that.   Says it is hard to do anything this year.    He is taking the bupropion and he does think it helps.  Things are getting him down which Roberta's health--walking and memory plus covid.  He thinks his aortitis is better.   He is still taking prednisone 7.5mg  daily.  He has not seen Dr. Dossie Der in quite a while.    Past Medical History:  Diagnosis Date  . Aneurysm of left renal artery (HCC)    per CT 08-20-2015 stable 1mm  . Aortitis syndrome ([place]) rheumotologist-  dr Charlestine Night   IgG4 syndrome-- treatment prednisone- (effects abdomine)  . Basal cell carcinoma   . Bladder neck contracture   . BPH (benign prostatic hyperplasia)   . CKD (chronic kidney disease), stage III   . Complication of anesthesia    post op acute urinary retention  . DDD (degenerative disc disease)   . Foley catheter in place    placed 02-29-2019 secondary to urinary retention post surgery  . Full dentures   . GERD (gastroesophageal reflux disease)   . History of external beam radiation therapy 2002   prostate cancer  and boost with radioative prostate seed implants  . History of prostate cancer DX  2002   S/P EXTERNAL RADIATION/ RADIOACTIVE SEED IMPLANTS  2003  . History of squamous cell  carcinoma excision    2012--  RIGHT LOWER EXTREM;  2019 LEFT LOWER EXTREMITIY  . HTN (hypertension)   . Hyperlipemia   . Hypothyroidism    endocrinologist-  dr Chalmers Cater--- per pt takes his own thyroid med. called NP Thyroid 10 mg daily, does not take synthroid  . Large vessel vasculitis (HCC)    aortitis--- rheumotologist-  dr Charlestine Night  . Left renal artery stenosis (HCC)    proximal per CT 08-20-2015 but patent  . Left renal atrophy   . Macular degeneration   . Nocturia   . OSA on CPAP    pt retested 11-13-2016 at [facility] w/ dr ather-- moderate to severe OSA , AHI 16.4/h/  03-24-2017 per pt is consistant using every night  . Pulmonary nodule    per CT 02-28-2017 in [place] in epic done at [place],  LUL nodule , not  mets  . Renal cyst, right   . Restless legs syndrome (RLS) 10/31/2011  . S/P radiation therapy     for tonsillar cancer (head and neck) completed 01-26-2012 at [facility])  . Saliva decreased   . Sarcoma of lower extremity, right ([place]) oncolgoist-  dr Juluis Rainier ([place])   dx 02-14-2017 w/ needle core bx;  high grade pleomorphic spindle cell sarcoma , grade 2 (cT2N0M0); 03-15-2017  radical resection sarcoma tumor right lower leg  and plan radiation  . Self-catheterizes urinary bladder    QID   AND   PRN  . 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 Urethral stricture    chronic---  post urethral dilation's  . Urinary retention with incomplete bladder emptying   . Vitamin B 12 deficiency 01/28/2011  . Vitamin D deficiency     Past Surgical History:  Procedure Laterality Date  . BALLOON DILATION N/A 11/10/2014   Procedure: CYSTO BALLOON DILATION AND RETROGRADE URETHROGRAM ;  Surgeon: Bjorn Loser, MD;  Location: [facility];  Service: Urology;  Laterality: N/A;  . CATARACT EXTRACTION W/ INTRAOCULAR LENS  IMPLANT, BILATERAL    . CYSTO/ BALLOON DILATION OF  URETHRAL STRICTURE  12-25-2010  . CYSTOSCOPY WITH RETROGRADE URETHROGRAM N/A 03/30/2016   Procedure: CYSTOSCOPY WITH RETROGRADE URETHROGRAM AND BALLOON DILATION with cystogram;  Surgeon: Bjorn Loser, MD;  Location: [facility];  Service: Urology;  Laterality: N/A;  . CYSTOSCOPY WITH URETHRAL DILATATION  05/31/2011   Procedure: CYSTOSCOPY WITH URETHRAL DILATATION;  Surgeon: Reece Packer, MD;  Location: [place];  Service: Urology;  Laterality: N/A;  BALLOON DILATION  . CYSTOSCOPY WITH URETHRAL DILATATION N/A  09/13/2016   Procedure: CYSTOSCOPY WITH URETHRAL BALLOON DILATATION;  Surgeon: Bjorn Loser, MD;  Location: [place];  Service: Urology;  Laterality: N/A;  . CYSTOSCOPY WITH URETHRAL DILATATION N/A 03/30/2017   Procedure: CYSTOSCOPY WITH BALLOON URETHRAL DILATATION;  Surgeon: Bjorn Loser, MD;  Location: [place];  Service: Urology;  Laterality: N/A;  . CYSTOSCOPY/RETROGRADE/URETEROSCOPY N/A 05/22/2012   Procedure: CYSTOSCOPY BALLOON DILATION RETROGRADE URETEROGRAM ;  Surgeon: Reece Packer, MD;  Location: [place];  Service: Urology;  Laterality: N/A;  . CYTSO/  DILATATION URETHRAL STRICTURE/  BX PROSTATIC URETHRA/  REMOVAL FOREIGN BODIES  06-25-2010   [facility]  .  INGUINAL HERNIA REPAIR Right 2000  . MOHS SURGERY  01/ 2019    [facility]   left lower leg for SCC  . RADICAL RESECTION OF SARCOMA TUMOR   03-15-2017   [facility]   RIGHT LOWER LEG , CALF AREA  . RADIOACTIVE SEED IMPLANTS, PROSTATE  JAN  2003  . SKIN LESION EXCISION  07/2012   [facility]  right shoulder    Allergies  Allergen Reactions  . Cefixime Rash  . Contrast Media [Iodinated Diagnostic Agents] Other (See Comments)    Avoid due to renal disease  . Macrobid [Nitrofurantoin Macrocrystal]   . Nitrofurantoin Swelling  . Nsaids Other (See Comments)    Has been told no NSAIDs, Ibuprofen Renal insufficiency hx.   . Cephalosporins Rash    Outpatient Encounter Medications as of 03/20/2019  Medication Sig  . buPROPion (WELLBUTRIN) 100 MG tablet Take 1 tablet by mouth twice daily  . cholecalciferol (VITAMIN D) 1000 units tablet Take 2,000 Units by mouth daily.  . Coenzyme Q10 (COQ10) 100 MG CAPS Take 100 mg by mouth daily. Take one daily  . cyanocobalamin 1000 MCG tablet Take 1,000 mcg by mouth daily. Take 1 tablet daily  . Cyanocobalamin-Methylcobalamin 600-600 MCG SUBL Place under the tongue daily.  Marland Kitchen DIGESTIVE ENZYMES PO Take 500 mg by mouth every morning.   . fish oil-omega-3  fatty acids 1000 MG capsule Take 2 g by mouth daily. Name:  Natural Factors Omega-3  . lisinopril (ZESTRIL) 10 MG tablet Take 1 tablet by mouth once daily  . Multiple Vitamins-Minerals (ICAPS AREDS 2 PO) Take 2 tablets by mouth daily.  . NP THYROID 90 MG tablet Take 90 mg by mouth daily.  . predniSONE (DELTASONE) 5 MG tablet Take 7.5 mg by mouth daily with breakfast.   . RESVERATROL PO Take 100 mg by mouth daily.   . TURMERIC CURCUMIN PO Take 2 tablets by mouth daily.   Marland Kitchen UNABLE TO FIND every other day. Mercie Eon take once daily - Vitamin E  . UNABLE TO FIND [place] take once daily  . UNABLE TO FIND [place] take 4 tablets once daily  . UNABLE TO FIND Comprehensive immune support take 2 tablets once daily  . UNABLE TO FIND Modified citrus pectin take 4 tablets once daily  . UNABLE TO FIND Celery seed take 2 tablets by mouth once daily   No facility-administered encounter medications on file as of 03/20/2019.    Review of Systems:  Review of Systems  Constitutional: Positive for malaise/fatigue. Negative for chills and fever.  HENT: Positive for ear pain. Negative for congestion and sore throat.   Eyes: Negative for blurred vision.  Respiratory: Negative for shortness of breath.   Cardiovascular: Positive for leg swelling. Negative for chest pain and palpitations.  Gastrointestinal: Negative for abdominal pain.  Genitourinary: Negative for dysuria.  Musculoskeletal: Negative for falls and joint pain.  Skin: Negative for itching and rash.  Neurological: Negative for dizziness and loss of consciousness.  Endo/Heme/Allergies: Bruises/bleeds easily.  Psychiatric/Behavioral: Positive for depression. Negative for memory loss. The patient is not nervous/anxious.        Improved with wellbutrin    Health Maintenance  Topic Date Due  . TETANUS/TDAP  01/17/2013  . INFLUENZA VACCINE  08/18/2018  . PNA vac Low Risk Adult  Completed    Physical Exam: Vitals:   03/20/19 1408    BP: (!) 142/92  Pulse: 72  Temp: (!) 97.5 F (36.4 C)  TempSrc: Temporal  SpO2: 97%  Weight: 164 lb (74.4 kg)  Height: 5\' 11"  (1.803 m)   Body mass index is 22.87 kg/m. Physical Exam Vitals reviewed.  Constitutional:      Appearance: Normal appearance.  Eyes:     Comments: Left lower lid cautery notable  Cardiovascular:     Rate and Rhythm: Normal rate and regular rhythm.     Pulses: Normal pulses.     Heart sounds: Normal heart sounds.  Pulmonary:     Effort: Pulmonary effort is normal.     Breath sounds: Normal breath sounds. No wheezing, rhonchi or rales.  Abdominal:     General: Bowel sounds are normal.     Palpations: Abdomen is soft.  Musculoskeletal:     Left lower leg: Edema present.     Comments: Atrophy of right leg, swelling of left--wearing thigh high compression stocking  Skin:    General: Skin is warm and dry.  Neurological:     General: No focal deficit present.     Mental Status: He is alert and oriented to person, place, and time.  Psychiatric:        Mood and Affect: Mood normal.     Labs reviewed: Basic Metabolic Panel: Recent Labs    05/03/18 0700 01/31/19 0800  NA  --  141  K  --  4.7  CL  --  102  CO2  --  30*  BUN  --  25*  CREATININE  --  1.5*  CALCIUM  --  9.2  TSH 3.85 4.21   Liver Function Tests: Recent Labs    01/31/19 0800  ALBUMIN 4.2   No results for input(s): LIPASE, AMYLASE in the last 8760 hours. No results for input(s): AMMONIA in the last 8760 hours. CBC: Recent Labs    01/31/19 0000  WBC 6.2  HGB 14.0  HCT 41  PLT 233   Lipid Panel: Recent Labs    05/03/18 0000  CHOL 253*  HDL 79*  LDLCALC 157  TRIG 81   No results found for: HGBA1C Assessment/Plan 1. B12 deficiency -he is willing to try b12 injections due to his deficiency so Rx sent to pharmacy--he says he already has needles/syringes from before when he took it - cyanocobalamin (,VITAMIN B-12,) 1000 MCG/ML injection; Inject 1 mL (1,000 mcg  total) into the muscle every 30 (thirty) days for 3 doses.  Dispense: 10 mL; Refill: 0 -recheck B12 level after 3 doses  2. Large vessel vasculitis (HCC) -reduced prednisone to 5mg  at his request  -Dr.Syed did not think he would tolerate this (did not when tried before), but he would like to try anyway so I agreed to monitor him   3. Chronic fatigue -has had for several years even prior to his aortitis dx -has never been adherent to his CPAP  4. Hypothyroidism due to acquired atrophy of thyroid -cont current levothyroxine, last tsh at goal  5. History of sarcoma -s/p surgery and XRT to right leg, followed at [facility]  6. Lymphedema of left leg -cont compression hose and then compression device--order was sent to my office and I signed it  Labs/tests ordered:  b12 at 3 mos Next appt:  4 mos  Xan Ingraham L. Leelan Rajewski, D.O. [facility] [address], [place] [address] Cell Phone (Mon-Fri 8am-5pm):  [phone] On Call:  [phone] & follow prompts after 5pm & weekends Office Phone:  [phone] Office Fax:  [phone]

## 2019-03-20 NOTE — Therapy (Signed)
[place], [place], [address] Phone: [phone]   Fax:  [phone]  Physical Therapy Treatment  Patient Details  Name: Philip [name] MRN: [id number] Date of Birth: [birth year] Referring Provider (PT): Hollace Kinnier DO   Encounter Date: 03/20/2019  PT End of Session - 03/20/19 1112    Visit Number  14    Number of Visits  17    Date for PT Re-Evaluation  04/04/19    PT Start Time  1104    PT Stop Time  1159    PT Time Calculation (min)  55 min    Activity Tolerance  Patient tolerated treatment well    Behavior During Therapy  [facility] for tasks assessed/performed       Past Medical History:  Diagnosis Date  . Aneurysm of left renal artery (HCC)    per CT 08-20-2015 stable 58mm  . 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 Basal cell carcinoma   . Bladder neck contracture   . BPH (benign prostatic hyperplasia)   . CKD (chronic kidney disease), stage III   . Complication of anesthesia    post op acute urinary retention  . DDD (degenerative disc disease)   . Foley catheter in place    placed 02-29-2019 secondary to urinary retention post surgery  . Full dentures   . GERD (gastroesophageal reflux disease)   . History of external beam radiation therapy 2002   prostate cancer  and boost with radioative prostate seed implants  . History of prostate cancer DX  2002   S/P EXTERNAL RADIATION/ RADIOACTIVE SEED IMPLANTS  2003  . History of squamous cell carcinoma excision    2012--  RIGHT LOWER EXTREM;  2019 LEFT LOWER EXTREMITIY  . HTN (hypertension)   . Hyperlipemia   . Hypothyroidism    endocrinologist-  dr Chalmers Cater--- per pt takes his own thyroid med. called NP Thyroid 10 mg daily, does not take synthroid  . Large vessel vasculitis (HCC)    aortitis--- rheumotologist-  dr Charlestine Night  . Left renal artery stenosis (HCC)    proximal per  CT 08-20-2015 but patent  . Left renal atrophy   . Macular degeneration   . Nocturia   . OSA on CPAP    pt retested 11-13-2016 at [facility] w/ dr ather-- moderate to severe OSA , AHI 16.4/h/  03-24-2017 per pt is consistant using every night  . Pulmonary nodule    per CT 02-28-2017 in [place] in epic done at [facility],  LUL nodule , not mets  . Renal cyst, right   . Restless legs syndrome (RLS) 10/31/2011  . S/P radiation therapy     for tonsillar cancer (head and neck) completed 01-26-2012 at [facility])  . Saliva decreased   . Sarcoma of lower extremity, right ([place]) oncolgoist-  dr Juluis Rainier ([place])   dx 02-14-2017 w/ needle core bx;  high grade pleomorphic spindle cell sarcoma , grade 2 (cT2N0M0); 03-15-2017  radical resection sarcoma tumor right lower leg  and plan radiation  . Self-catheterizes urinary bladder    QID   AND   PRN  . 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 Urethral stricture    chronic---  post urethral dilation's  . Urinary retention with incomplete bladder emptying   . Vitamin B 12 deficiency 01/28/2011  . Vitamin D deficiency     Past Surgical History:  Procedure Laterality Date  . BALLOON DILATION N/A 11/10/2014   Procedure: CYSTO BALLOON DILATION AND RETROGRADE URETHROGRAM ;  Surgeon: Bjorn Loser, MD;  [facility];  Service: Urology;  Laterality: N/A;  . CATARACT EXTRACTION W/ INTRAOCULAR LENS  IMPLANT, BILATERAL    . CYSTO/ BALLOON DILATION OF  URETHRAL STRICTURE  12-25-2010  . CYSTOSCOPY WITH RETROGRADE URETHROGRAM N/A 03/30/2016   Procedure: CYSTOSCOPY WITH RETROGRADE URETHROGRAM AND BALLOON DILATION with cystogram;  Surgeon: Bjorn Loser, MD;   Location: [facility];  Service: Urology;  Laterality: N/A;  . CYSTOSCOPY WITH URETHRAL DILATATION  05/31/2011   Procedure: CYSTOSCOPY WITH URETHRAL DILATATION;  Surgeon: Reece Packer, MD;  Location: [place];  Service: Urology;  Laterality: N/A;  BALLOON DILATION  . CYSTOSCOPY WITH URETHRAL DILATATION N/A 09/13/2016   Procedure: CYSTOSCOPY WITH URETHRAL BALLOON DILATATION;  Surgeon: Bjorn Loser, MD;  Location: [place];  Service: Urology;  Laterality: N/A;  . CYSTOSCOPY WITH URETHRAL DILATATION N/A 03/30/2017   Procedure: CYSTOSCOPY WITH BALLOON URETHRAL DILATATION;  Surgeon: Bjorn Loser, MD;  Location: [place];  Service: Urology;  Laterality: N/A;  . CYSTOSCOPY/RETROGRADE/URETEROSCOPY N/A 05/22/2012   Procedure: CYSTOSCOPY BALLOON DILATION RETROGRADE URETEROGRAM ;  Surgeon: Reece Packer, MD;  Location: [place];  Service: Urology;  Laterality: N/A;  . CYTSO/  DILATATION URETHRAL STRICTURE/  BX PROSTATIC URETHRA/  REMOVAL FOREIGN BODIES  06-25-2010   [facility]  . INGUINAL HERNIA REPAIR Right 2000  . MOHS SURGERY  01/ 2019    [facility]   left lower leg for SCC  . RADICAL RESECTION OF SARCOMA TUMOR   03-15-2017   [facility]   RIGHT LOWER LEG , CALF AREA  . RADIOACTIVE SEED IMPLANTS, PROSTATE  JAN  2003  . SKIN LESION EXCISION  07/2012   MOST  right shoulder    There were no vitals filed for this visit.  Subjective Assessment - 03/20/19 1112    Subjective  Pt states that he ordered some shorts off of [employer]. He did not order the ones that he was provided with becuase he was experiencing difficulty navigating the website. He continues to wear his compression garment consistently.    Pertinent History  Hx Prostate cancer and R lower leg sarcoma    How long can you walk comfortably?  Pt states that his legs get achy and he feels winded after walking about 40 minutes    Diagnostic tests  MRI, CT scan and  doppler without significant findings.    Currently in Pain?  No/denies    Multiple Pain Sites  No            LYMPHEDEMA/ONCOLOGY QUESTIONNAIRE - 03/20/19 1114      Left Lower Extremity Lymphedema   30 cm Proximal to Suprapatella  50 cm    20 cm Proximal to Suprapatella  45.8 cm    10 cm Proximal to Suprapatella  41 cm    At Midpatella/Popliteal Crease  36 cm    30 cm Proximal to Floor at Lateral Plantar Foot  30.8 cm    20 cm Proximal to Floor at Lateral Plantar Foot  23.2 cm    10 cm Proximal to Floor at Lateral Malleoli  23.4 cm    5 cm Proximal to 1st MTP Joint  22 cm    Around Proximal Great Toe  8.7 cm           Outpatient Re[facility]/28/[facility]  Lymphedema Life Impact Scale Total Score  11.76 %           [facility] Adult PT Treatment/Exercise - 03/20/19 0001      Knee/Hip Exercises: Standing   Heel Raises  Both;1 set    Heel Raises Limitations  18 reps Demonstration and VC for correct movement.     Other Standing Knee Exercises  Toe raises 18x demonstration and VC to prevent compensation w/rocking back.       Knee/Hip Exercises: Supine   Quad Sets  Strengthening;Right;Left;1 set    Quad Sets Limitations  10x 5 seconds hip extension isometric into table VC for correct movement.     Bridges  Strengthening;1 [employer] Limitations  18 reps w/VC for correct movement.     Straight Leg Raises  Strengthening;Right;Left;1 set    Straight Leg Raises Limitations  18x VC for correct movement    Other Supine Knee/Hip Exercises  Lower trunk rotation stretch, Supine hamstring stretch (dynamic) 3x 10 seconds, supine modified thomas stretch 1x30 seconds, Supine pec stretch bil hands behind head 3x 10 seconds  w/VC for correct movement.     Other Supine Knee/Hip Exercises  Supine scapular retraction 10x 3 seconds and Pelvic tilt w/marching 36x alternating w/intermittent VC for correct movement and to maintain Transverse abdominus  activation.       Manual Therapy   Manual Therapy  Edema management    Edema Management  circumferential measurements taken. Pt has reduced slightly. Educated to keep wearing his compression garment and to wear his compression shorts once he receives them. If they are not comfrotable or appropriate he will order the ones that he was assisted with picking out in the clinic today.              PT Education - 03/20/19 1121    Education Details  Access Code:[email or web address], pt will perform new HEP. He will continue to wear his compression garment and once he gets compression shorts will wear those if they fit and are comfortable. If not he will return and purchase the ones that we picked out on-line.    Person(s) Educated  Patient    Methods  Explanation;Demonstration;Verbal cues;Handout    Comprehension  Verbalized understanding       PT Short Term Goals - 02/28/19 1020      PT SHORT TERM GOAL #1   Title  Pt will be independent with self MLD and HEP within 2 weeks in order to demonstrate autonomy of care.    Baseline  Pt reports that he does some exercises at home and occasoinally performing self MLD.    Time  4    Period  Weeks    Status  On-going    Target Date  04/04/19        PT Long Term Goals - 02/28/19 1021      PT LONG TERM GOAL #1   Title  Patient will have reduction of L distal thigh girth by 3 cm to demonstrate decreased fluid build up in the L lower extremity.    Baseline  see measurements    Time  4    Period  Weeks    Status  On-going  PT LONG TERM GOAL #2   Title  Patient to be properly fitted with compression garment to wear on daily basis.    Baseline  pt has 20-30 mmHG garment but requires higher compression to control edema.    Time  4    Period  Weeks    Status  On-going    Target Date  04/04/19      PT LONG TERM GOAL #3   Title  Patient will be independent in prevention/self-care management principles including self-massage and long term management  plan for edema.    Baseline  Pt is more aware of how to manage his lymphedema but still has questions.    Time  4    Period  Weeks    Status  On-going    Target Date  04/04/19      PT LONG TERM GOAL #4   Title  Pt endurance goals will be set following 6 minute walk test.    Baseline  see below      PT LONG TERM GOAL #5   Title  Pt will improve LLIS score to 10% or less within 4 weeks to demonstrate impove quality of life with lymphedema    Baseline  11.76    Time  4    Period  Weeks    Status  On-going    Target Date  04/04/19      PT LONG TERM GOAL #6   Title  Pt will rate himself walking for 6 minutes on the bike at level 4 at 5-6 RPE within 4 weeks to demonstrate improved endurance.    Baseline  have not been able to work on this due to wrapping pt leg.    Time  4    Period  Weeks    Status  On-going    Target Date  04/04/19            Plan - 03/20/19 1112    Clinical Impression Statement  Pt demonstrates decreased circumferential measurements in the thigh this session. He presents in his compression garment. He continues with increased edema in his L thigh compared to his R despite wearing constant compression. Postural and LE strength/stretching was performed today with pt HEP updated for performance at home to begin to work on endurance. Pt required occasional VC and demonstration for correct movement and muscle activation. Pt will benefit from continued POC at this time.    Personal Factors and Comorbidities  Comorbidity 2    Comorbidities  Hx prostate cancer and R lower leg sarcoma    Rehab Potential  Good    PT Frequency  2x / week    PT Duration  4 weeks    PT Treatment/Interventions  Functional mobility training;Therapeutic activities;Therapeutic exercise;Neuromuscular re-education;Patient/family education;Manual techniques    PT Next Visit Plan  assess HEP, progress endurance exercises, measure circumference and look at compression shorts.    PT Home Exercise  Plan  Access Code: [email or web address]    Consulted and Agree with Plan of Care  Patient       Patient will benefit from skilled therapeutic intervention in order to improve the following deficits and impairments:  Decreased endurance, Increased edema  Visit Diagnosis: Lymphedema, not elsewhere classified  Other abnormalities of gait and mobility     Problem List Patient Active Problem List   Diagnosis Date Noted  . Major depressive disorder with single episode, in partial remission ([place]) 05/04/2018  . Atherosclerosis of aorta ([place]) 05/04/2018  . Thoracic aortic  aneurysm without rupture ([place]) 05/04/2018  . Urethral stricture in male 12/20/2017  . Pulmonary nodules/lesions, multiple 12/20/2017  . Atherosclerosis of renal artery ([place]) 07/05/2017  . Chronic kidney disease, stage 3 03/21/2017  . Complication of skin graft 03/16/2017  . Squamous cell carcinoma, leg, right 01/25/2017  . Large vessel vasculitis ([place]) 06/10/2015  . H/O drug therapy 12/31/2013  . Hyperlipidemia 11/04/2013  . Delayed effect of radiation 05/06/2013  . Depression 04/01/2013  . Male sexual dysfunction 01/20/2013  . Fatigue 10/22/2012  . Hypothyroidism 07/23/2012  . History of prostate cancer   . Essential hypertension   . Tonsillar cancer ([place])   . Binocular vision disorder with diplopia 07/10/2012  . PLMD (periodic limb movement disorder) 09/13/2011  . OSA (obstructive sleep apnea) 09/13/2011  . Actinic keratosis 01/28/2011  . Vitamin B 12 deficiency 01/28/2011  . Spondylosis 08/02/2010  . Insomnia 08/02/2010    Ander Purpura, PT 03/20/2019, 12:00 PM  [place], [place], [address] Phone: [phone]   Fax:  [phone]  Name: SAKARI WOFFORD MRN: [id number] Date of Birth: 01/08/1933

## 2019-03-25 ENCOUNTER — Other Ambulatory Visit: Payer: Self-pay

## 2019-03-25 ENCOUNTER — Ambulatory Visit: Payer: Medicare Other

## 2019-03-25 DIAGNOSIS — I890 Lymphedema, not elsewhere classified: Secondary | ICD-10-CM

## 2019-03-25 DIAGNOSIS — R2689 Other abnormalities of gait and mobility: Secondary | ICD-10-CM | POA: Diagnosis not present

## 2019-03-25 NOTE — Therapy (Signed)
[place], [place], [address] Phone: [phone]   Fax:  [phone]  Physical Therapy Treatment  Patient Details  Name: Philip [name] MRN: [id number] Date of Birth: Apr 19, 1932 Referring Provider (PT): Hollace Kinnier DO   Encounter Date: 03/25/2019  PT End of Session - 03/25/19 1105    Visit Number  15    Number of Visits  17    Date for PT Re-Evaluation  04/04/19    PT Start Time  1100    PT Stop Time  1155    PT Time Calculation (min)  55 min    Activity Tolerance  Patient tolerated treatment well    Behavior During Therapy  [facility] for tasks assessed/performed       Past Medical History:  Diagnosis Date  . Aneurysm of left renal artery (HCC)    per CT 08-20-2015 stable 72mm  . 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 Basal cell carcinoma   . Bladder neck contracture   . BPH (benign prostatic hyperplasia)   . CKD (chronic kidney disease), stage III   . Complication of anesthesia    post op acute urinary retention  . DDD (degenerative disc disease)   . Foley catheter in place    placed 02-29-2019 secondary to urinary retention post surgery  . Full dentures   . GERD (gastroesophageal reflux disease)   . History of external beam radiation therapy 2002   prostate cancer  and boost with radioative prostate seed implants  . History of prostate cancer DX  2002   S/P EXTERNAL RADIATION/ RADIOACTIVE SEED IMPLANTS  2003  . History of squamous cell carcinoma excision    2012--  RIGHT LOWER EXTREM;  2019 LEFT LOWER EXTREMITIY  . HTN (hypertension)   . Hyperlipemia   . Hypothyroidism    endocrinologist-  dr Chalmers Cater--- per pt takes his own thyroid med. called NP Thyroid 10 mg daily, does not take synthroid  . Large vessel vasculitis (HCC)    aortitis--- rheumotologist-  dr Charlestine Night  . Left renal artery stenosis (HCC)    proximal per  CT 08-20-2015 but patent  . Left renal atrophy   . Macular degeneration   . Nocturia   . OSA on CPAP    pt retested 11-13-2016 at [facility] w/ dr ather-- moderate to severe OSA , AHI 16.4/h/  03-24-2017 per pt is consistant using every night  . Pulmonary nodule    per CT 02-28-2017 in [place] in epic done at [place],  LUL nodule , not mets  . Renal cyst, right   . Restless legs syndrome (RLS) 10/31/2011  . S/P radiation therapy     for tonsillar cancer (head and neck) completed 01-26-2012 at [facility])  . Saliva decreased   . Sarcoma of lower extremity, right ([place]) oncolgoist-  dr Juluis Rainier ([place])   dx 02-14-2017 w/ needle core bx;  high grade pleomorphic spindle cell sarcoma , grade 2 (cT2N0M0); 03-15-2017  radical resection sarcoma tumor right lower leg  and plan radiation  . Self-catheterizes urinary bladder    QID   AND   PRN  . 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 Urethral stricture    chronic---  post urethral dilation's  . Urinary retention with incomplete bladder emptying   . Vitamin B 12 deficiency 01/28/2011  . Vitamin D deficiency     Past Surgical History:  Procedure Laterality Date  . BALLOON DILATION N/A 11/10/2014   Procedure: CYSTO BALLOON DILATION AND RETROGRADE URETHROGRAM ;  Surgeon: Bjorn Loser, MD;  Location: [facility];  Service: Urology;  Laterality: N/A;  . CATARACT EXTRACTION W/ INTRAOCULAR LENS  IMPLANT, BILATERAL    . CYSTO/ BALLOON DILATION OF  URETHRAL STRICTURE  12-25-2010  . CYSTOSCOPY WITH RETROGRADE URETHROGRAM N/A 03/30/2016   Procedure: CYSTOSCOPY WITH RETROGRADE URETHROGRAM AND BALLOON DILATION with cystogram;  Surgeon: Bjorn Loser, MD;   Location: [facility];  Service: Urology;  Laterality: N/A;  . CYSTOSCOPY WITH URETHRAL DILATATION  05/31/2011   Procedure: CYSTOSCOPY WITH URETHRAL DILATATION;  Surgeon: Reece Packer, MD;  Location: [place];  Service: Urology;  Laterality: N/A;  BALLOON DILATION  . CYSTOSCOPY WITH URETHRAL DILATATION N/A 09/13/2016   Procedure: CYSTOSCOPY WITH URETHRAL BALLOON DILATATION;  Surgeon: Bjorn Loser, MD;  Location: [place];  Service: Urology;  Laterality: N/A;  . CYSTOSCOPY WITH URETHRAL DILATATION N/A 03/30/2017   Procedure: CYSTOSCOPY WITH BALLOON URETHRAL DILATATION;  Surgeon: Bjorn Loser, MD;  Location: [place];  Service: Urology;  Laterality: N/A;  . CYSTOSCOPY/RETROGRADE/URETEROSCOPY N/A 05/22/2012   Procedure: CYSTOSCOPY BALLOON DILATION RETROGRADE URETEROGRAM ;  Surgeon: Reece Packer, MD;  Location: [place];  Service: Urology;  Laterality: N/A;  . CYTSO/  DILATATION URETHRAL STRICTURE/  BX PROSTATIC URETHRA/  REMOVAL FOREIGN BODIES  06-25-2010   [facility]  . INGUINAL HERNIA REPAIR Right 2000  . MOHS SURGERY  01/ 2019    [facility]   left lower leg for SCC  . RADICAL RESECTION OF SARCOMA TUMOR   03-15-2017   [facility]   RIGHT LOWER LEG , CALF AREA  . RADIOACTIVE SEED IMPLANTS, PROSTATE  JAN  2003  . SKIN LESION EXCISION  07/2012   MOST  right shoulder    There were no vitals filed for this visit.  Subjective Assessment - 03/25/19 1105    Subjective  Pt states that he has not yet received his compression shorts. He states that his compression garment is tight around his leg today which is causing some discomfort (see education) Pt states that he has done his exercises every day except Saturday he got busy.    Pertinent History  Hx Prostate cancer and R lower leg sarcoma    How long can you walk comfortably?  Pt states that his legs get achy and he feels winded after walking about 40 minutes     Diagnostic tests  MRI, CT scan and doppler without significant findings.    Patient Stated Goals  I want to get this swelling down and work on my endurance.    Currently in Pain?  No/denies    Multiple Pain Sites  No                  Outpatient Rehab from [facility]/28/[facility]  Lymphedema Life Impact Scale Total Score  11.76 %           [facility] Adult PT Treatment/Exercise - 03/25/19 0001      Knee/Hip Exercises: Aerobic   Recumbent Bike  Lvl 3 8 min HR 112 slight increaed in resperations w/o significant shortness of  breathe and SPO2 96% after 6.5 min      Knee/Hip Exercises: Standing   Heel Raises  Both;1 set    Heel Raises Limitations  18 reps with VC for going as high as possible.     Forward Step Up  1 set;Right;Left    Forward Step Up Limitations  12 6" step  w/SBAto CGA no LOB    Functional Squat  1 set;15 reps    Functional Squat Limitations  sit to stand from raised plinth table     Other Standing Knee Exercises  Toe raises 18x demonstration and VC to prevent compensation w/rocking back and UE support    Other Standing Knee Exercises  D2 flexion pattern in standing with YTB for core/upper back stabilization 12x L then R Tactile cueing and demonstration for correct movement.       Knee/Hip Exercises: Supine   Quad Sets  Strengthening;Right;Left;1 set    Quad Sets Limitations  10x 5 seconds hip isometrics into the plinth table     [employer];Both    Bridges Limitations  18 reps w/VC to go as high as he can    Straight Leg Raises  Strengthening;Right;Left;1 set    Straight Leg Raises Limitations  18x VC for soft landings in order to prevent compensation    Other Supine Knee/Hip Exercises  Lower trunk rotation stretch, Supine hamstring stretch (dynamic) 3x 10 seconds, supine modified thomas stretch 1x30 seconds, Supine pec stretch bil hands behind head 3x 10 seconds  w/VC to avoid pain    Other Supine Knee/Hip  Exercises  Supine scapular retraction 10x 3 seconds and Pelvic tilt w/marching 36x alternating w/intermittent VC for soft landing at the LLE and for mantaining transverse abdominus activation             PT Education - 03/25/19 1108    Education Details  Pt compression garment was bunched up at his  mid-thigh causing significant indentation at the mid-thigh he was instructed to correct this and that he needs to be very careful to keep wrinkles out of his garment to decrease risk of increased edema below the level of the bunched fabric. Pt will continue with current HEP at this time.    Person(s) Educated  Patient    Methods  Explanation    Comprehension  Verbalized understanding       PT Short Term Goals - 02/28/19 1020      PT SHORT TERM GOAL #1   Title  Pt will be independent with self MLD and HEP within 2 weeks in order to demonstrate autonomy of care.    Baseline  Pt reports that he does some exercises at home and occasoinally performing self MLD.    Time  4    Period  Weeks    Status  On-going    Target Date  04/04/19        PT Long Term Goals - 02/28/19 1021      PT LONG TERM GOAL #1   Title  Patient will have reduction of L distal thigh girth by 3 cm to demonstrate decreased fluid build up in the L lower extremity.    Baseline  see measurements    Time  4    Period  Weeks    Status  On-going      PT LONG TERM GOAL #2   Title  Patient to be properly fitted with compression garment to wear on daily basis.    Baseline  pt  has 20-30 mmHG garment but requires higher compression to control edema.    Time  4    Period  Weeks    Status  On-going    Target Date  04/04/19      PT LONG TERM GOAL #3   Title  Patient will be independent in prevention/self-care management principles including self-massage and long term management plan for edema.    Baseline  Pt is more aware of how to manage his lymphedema but still has questions.    Time  4    Period  Weeks    Status   On-going    Target Date  04/04/19      PT LONG TERM GOAL #4   Title  Pt endurance goals will be set following 6 minute walk test.    Baseline  see below      PT LONG TERM GOAL #5   Title  Pt will improve LLIS score to 10% or less within 4 weeks to demonstrate impove quality of life with lymphedema    Baseline  11.76    Time  4    Period  Weeks    Status  On-going    Target Date  04/04/19      PT LONG TERM GOAL #6   Title  Pt will rate himself walking for 6 minutes on the bike at level 4 at 5-6 RPE within 4 weeks to demonstrate improved endurance.    Baseline  have not been able to work on this due to wrapping pt leg.    Time  4    Period  Weeks    Status  On-going    Target Date  04/04/19            Plan - 03/25/19 1104    Clinical Impression Statement  Pt presents with his L LE compression garment on that is significantly bunched in the mid thigh causing indentation (see education). Pt was assisted with smoothing out his compression wrap following education. Pt was progressed full body strengthening activities beginning in supine, followed by stretching and then standing activities including core/hip stabilization. Pt was able to tolerate increased activities w/o shortness of breathe this session. Pt still has not received his compression shorts and continues with edema in the L thigh and LE though he has not increased in circumference. Pt will benefit from continued POC at this time.    Personal Factors and Comorbidities  Comorbidity 2    Comorbidities  Hx prostate cancer and R lower leg sarcoma    Stability/Clinical Decision Making  Stable/Uncomplicated    Rehab Potential  Good    PT Frequency  2x / week    PT Duration  4 weeks    PT Treatment/Interventions  Functional mobility training;Therapeutic activities;Therapeutic exercise;Neuromuscular re-education;Patient/family education;Manual techniques    PT Next Visit Plan  update HEP next session, add forward and side lunges,  progress endurance exercises, measure circumference and look at compression shorts.    PT Home Exercise Plan  Access Code: [email or web address]    Consulted and Agree with Plan of Care  Patient       Patient will benefit from skilled therapeutic intervention in order to improve the following deficits and impairments:  Decreased endurance, Increased edema  Visit Diagnosis: Lymphedema, not elsewhere classified  Other abnormalities of gait and mobility     Problem List Patient Active Problem List   Diagnosis Date Noted  . Major depressive disorder with single episode, in partial remission ([place]) 05/04/2018  .  Atherosclerosis of aorta ([place]) 05/04/2018  . Thoracic aortic aneurysm without rupture ([place]) 05/04/2018  . Urethral stricture in male 12/20/2017  . Pulmonary nodules/lesions, multiple 12/20/2017  . Atherosclerosis of renal artery ([place]) 07/05/2017  . Chronic kidney disease, stage 3 03/21/2017  . Complication of skin graft 03/16/2017  . Squamous cell carcinoma, leg, right 01/25/2017  . Large vessel vasculitis ([place]) 06/10/2015  . H/O drug therapy 12/31/2013  . Hyperlipidemia 11/04/2013  . Delayed effect of radiation 05/06/2013  . Depression 04/01/2013  . Male sexual dysfunction 01/20/2013  . Fatigue 10/22/2012  . Hypothyroidism 07/23/2012  . History of prostate cancer   . Essential hypertension   . Tonsillar cancer ([place])   . Binocular vision disorder with diplopia 07/10/2012  . PLMD (periodic limb movement disorder) 09/13/2011  . OSA (obstructive sleep apnea) 09/13/2011  . Actinic keratosis 01/28/2011  . Vitamin B 12 deficiency 01/28/2011  . Spondylosis 08/02/2010  . Insomnia 08/02/2010    Ander Purpura, PT 03/25/2019, 11:56 AM  [place], [place], [address] Phone: [phone]   Fax:  [phone]  Name: Philip [name] MRN: [id number] Date of Birth: Oct 27, 1932

## 2019-03-26 DIAGNOSIS — L905 Scar conditions and fibrosis of skin: Secondary | ICD-10-CM | POA: Diagnosis not present

## 2019-03-26 DIAGNOSIS — D485 Neoplasm of uncertain behavior of skin: Secondary | ICD-10-CM | POA: Diagnosis not present

## 2019-03-26 DIAGNOSIS — C44629 Squamous cell carcinoma of skin of left upper limb, including shoulder: Secondary | ICD-10-CM | POA: Diagnosis not present

## 2019-03-26 DIAGNOSIS — D0462 Carcinoma in situ of skin of left upper limb, including shoulder: Secondary | ICD-10-CM | POA: Diagnosis not present

## 2019-03-27 ENCOUNTER — Ambulatory Visit: Payer: Medicare Other

## 2019-03-27 ENCOUNTER — Other Ambulatory Visit: Payer: Self-pay

## 2019-03-27 DIAGNOSIS — R2689 Other abnormalities of gait and mobility: Secondary | ICD-10-CM | POA: Diagnosis not present

## 2019-03-27 DIAGNOSIS — I890 Lymphedema, not elsewhere classified: Secondary | ICD-10-CM | POA: Diagnosis not present

## 2019-03-27 NOTE — Patient Instructions (Addendum)
Access Code: [email or web address]  URL: [email or web address]  Date: 03/27/2019  Prepared by: Tomma Rakers   Exercises Supine Lower Trunk Rotation - 3 reps - 1 sets - 3xto eash side and hold for 10 seconds each hold - 1x daily - 7x weekly Supine Hamstring Stretch - 3 reps - 1 sets - 3 x each side 10 seconds hold - 1x daily - 7x weekly Modified Thomas Stretch - 1 reps - 1 sets - 1x each side 30 seconds on the edge of the bed do not fall off hold - 1x daily - 7x weekly Supine Chest Stretch with Elbows Bent - 3 reps - 1 sets - 10 seconds hold - 1x daily - 7x weekly Supine Shoulder Horizontal Abduction with Resistance - 20 reps - 1 sets - 1x daily - 7x weekly DNS Bug Heel Touches - 40 reps - 1 sets - keep your back flat on the bed you can have your hands anywhere you would like. hold - 1x daily - 7x weekly Supine Bridge with Gluteal Set and Spinal Articulation - 20 reps - 1 sets - 1x daily - 7x weekly Active Straight Leg Raise with Quad Set - 20 reps - 1 sets - 18x on each side hold - 1x daily - 7x weekly Supine Isometric Hamstring Set - 10 reps - 1 sets - 10x on each side then hold for 10 seconds hold - 1x daily - 7x weekly Heel rises with counter support - 20 reps - 1 sets - 1x daily - 7x weekly Lunge with Counter Support - 18 reps - 3 sets - 1x daily - 7x weekly Toe Raises with Counter Support - 20 reps - 1 sets - 1x daily - 7x weekly Side Lunge with Counter Support - 18 reps - 3 sets - 1x daily - 7x weekly Sit to Stand with Arms Crossed - 15 reps - 1 sets - 1x daily - 7x weekly

## 2019-03-27 NOTE — Therapy (Signed)
[place], [place], [address] Phone: [phone]   Fax:  [phone]  Physical Therapy Treatment  Patient Details  Name: Philip [name] MRN: [id number] Date of Birth: 1932/04/28 Referring Provider (PT): Hollace Kinnier DO   Encounter Date: 03/27/2019  PT End of Session - 03/27/19 1117    Visit Number  16    Number of Visits  17    Date for PT Re-Evaluation  04/04/19    PT Start Time  1105    PT Stop Time  1200    PT Time Calculation (min)  55 min    Activity Tolerance  Patient tolerated treatment well    Behavior During Therapy  [facility] for tasks assessed/performed       Past Medical History:  Diagnosis Date  . Aneurysm of left renal artery (HCC)    per CT 08-20-2015 stable 27mm  . Aortitis syndrome ([place]) rheumotologist-  dr Charlestine Night   IgG4 syndrome-- treatment prednisone- (effects abdomine)  . Basal cell carcinoma   . Bladder neck contracture   . BPH (benign prostatic hyperplasia)   . CKD (chronic kidney disease), stage III   . Complication of anesthesia    post op acute urinary retention  . DDD (degenerative disc disease)   . Foley catheter in place    placed 02-29-2019 secondary to urinary retention post surgery  . Full dentures   . GERD (gastroesophageal reflux disease)   . History of external beam radiation therapy 2002   prostate cancer  and boost with radioative prostate seed implants  . History of prostate cancer DX  2002   S/P EXTERNAL RADIATION/ RADIOACTIVE SEED IMPLANTS  2003  . History of squamous cell carcinoma excision    2012--  RIGHT LOWER EXTREM;  2019 LEFT LOWER EXTREMITIY  . HTN (hypertension)   . Hyperlipemia   . Hypothyroidism    endocrinologist-  dr Chalmers Cater--- per pt takes his own thyroid med. called NP Thyroid 10 mg daily, does not take synthroid  . Large vessel vasculitis (HCC)    aortitis--- rheumotologist-  dr Charlestine Night  . Left renal artery stenosis (HCC)    proximal  per CT 08-20-2015 but patent  . Left renal atrophy   . Macular degeneration   . Nocturia   . OSA on CPAP    pt retested 11-13-2016 at [facility] w/ dr ather-- moderate to severe OSA , AHI 16.4/h/  03-24-2017 per pt is consistant using every night  . Pulmonary nodule    per CT 02-28-2017 in [place] in epic done at [place],  LUL nodule , not mets  . Renal cyst, right   . Restless legs syndrome (RLS) 10/31/2011  . S/P radiation therapy     for tonsillar cancer (head and neck) completed 01-26-2012 at [facility])  . Saliva decreased   . Sarcoma of lower extremity, right ([place]) oncolgoist-  dr Juluis Rainier ([place])   dx 02-14-2017 w/ needle core bx;  high grade pleomorphic spindle cell sarcoma , grade 2 (cT2N0M0); 03-15-2017  radical resection sarcoma tumor right lower leg  and plan radiation  . Self-catheterizes urinary bladder    QID   AND   PRN  . 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 Urethral stricture    chronic---  post urethral dilation's  . Urinary retention with incomplete bladder emptying   . Vitamin B 12 deficiency 01/28/2011  . Vitamin D deficiency     Past Surgical History:  Procedure Laterality Date  . BALLOON DILATION N/A 11/10/2014   Procedure: CYSTO BALLOON DILATION AND RETROGRADE URETHROGRAM ;  Surgeon: Bjorn Loser, MD;  Location: [facility];  Service: Urology;  Laterality: N/A;  . CATARACT EXTRACTION W/ INTRAOCULAR LENS  IMPLANT, BILATERAL    . CYSTO/ BALLOON DILATION OF  URETHRAL STRICTURE  12-25-2010  . CYSTOSCOPY WITH RETROGRADE URETHROGRAM N/A 03/30/2016   Procedure: CYSTOSCOPY WITH RETROGRADE URETHROGRAM AND BALLOON DILATION with cystogram;  Surgeon: Bjorn Loser,  MD;  Location: [facility];  Service: Urology;  Laterality: N/A;  . CYSTOSCOPY WITH URETHRAL DILATATION  05/31/2011   Procedure: CYSTOSCOPY WITH URETHRAL DILATATION;  Surgeon: Reece Packer, MD;  Location: [place];  Service: Urology;  Laterality: N/A;  BALLOON DILATION  . CYSTOSCOPY WITH URETHRAL DILATATION N/A 09/13/2016   Procedure: CYSTOSCOPY WITH URETHRAL BALLOON DILATATION;  Surgeon: Bjorn Loser, MD;  Location: [place];  Service: Urology;  Laterality: N/A;  . CYSTOSCOPY WITH URETHRAL DILATATION N/A 03/30/2017   Procedure: CYSTOSCOPY WITH BALLOON URETHRAL DILATATION;  Surgeon: Bjorn Loser, MD;  Location: [place];  Service: Urology;  Laterality: N/A;  . CYSTOSCOPY/RETROGRADE/URETEROSCOPY N/A 05/22/2012   Procedure: CYSTOSCOPY BALLOON DILATION RETROGRADE URETEROGRAM ;  Surgeon: Reece Packer, MD;  Location: [place];  Service: Urology;  Laterality: N/A;  . CYTSO/  DILATATION URETHRAL STRICTURE/  BX PROSTATIC URETHRA/  REMOVAL FOREIGN BODIES  06-25-2010   [facility]  . INGUINAL HERNIA REPAIR Right 2000  . MOHS SURGERY  01/ 2019    [facility]   left lower leg for SCC  . RADICAL RESECTION OF SARCOMA TUMOR   03-15-2017   [facility]   RIGHT LOWER LEG , CALF AREA  . RADIOACTIVE SEED IMPLANTS, PROSTATE  JAN  2003  . SKIN LESION EXCISION  07/2012   MOST  right shoulder    There were no vitals filed for this visit.  Subjective Assessment - 03/27/19 1117    Subjective  Pt reports that he still has not received his compression shorts.    Pertinent History  Hx Prostate cancer and R lower leg sarcoma    How long can you walk comfortably?  Pt states that his legs get achy and he feels winded after walking about 40 minutes    Diagnostic tests  MRI, CT scan and doppler without significant findings.    Patient Stated Goals  I want to get this swelling down and work on my endurance.    Currently in Pain?  No/denies     Multiple Pain Sites  No                  Outpatient Rehab [facility]/28/[facility]  Lymphedema Life Impact Scale Total Score  11.76 %           [facility] Adult PT Treatment/Exercise - 03/27/19 0001      Knee/Hip Exercises: Standing   Heel Raises  Both;1 set    Heel Raises Limitations  20 reps pt performed well w/o UE support     Forward Lunges  Right;Left;1 set    Forward Lunges Limitations  18x following demonstration w/unilateral UE support. VC to not go as deep due to abduction  on the posterior leg with R leg back and tremors    Side Lunges  Right;Left;1 set    Side Lunges Limitations  18x demonstration and VC for correct movement to prevent anterior tibial progression over the toes and for correct hip alignment    Forward Step Up  1 set;Right;Left    Forward Step Up Limitations  12x 6" step w/SBA 1x difficulty on the first step then pt was smooth with movement slight increase in momentum with step down due to compensation related to muscle weakness.     Functional Squat  1 set;15 reps    Functional Squat Limitations  sit to stand from raised pllinth table arms crossed good form slight increased in momentum in the last 10 degrees of descent.     Other Standing Knee Exercises  Toe raises 20x no demonstration required this session.     Other Standing Knee Exercises  D2 flexion pattern in standing with YTB for core/upper back stabilization 15x L then R Tactile cueing to prevent thoracic rotation and for correct movement of the UE pt had good carry over from the contralateral arm w/o need for cueing.       Knee/Hip Exercises: Supine   Quad Sets  Strengthening;Right;Left;1 set    Quad Sets Limitations  10x 5 seconds hip extension isometric into table VC to perform DF    [employer];Both    [place] Limitations  20 reps w/VC for movement    Straight Leg Raises  Strengthening;Right;Left;1 set    Straight Leg Raises  Limitations  20x pt had improved landing this session on need for VC to prevent compensation w/momentum.     Other Supine Knee/Hip Exercises  Lower trunk rotation stretch, Supine hamstring stretch (dynamic) 3x 10 seconds, supine modified thomas stretch 1x30 seconds, Supine pec stretch bil hands behind head 3x 10 seconds  w/VC to avoid pain    Other Supine Knee/Hip Exercises  Supine scapular retraction 18x into shoulder horizontal abd w/yellow theraband demonstration tactile cues and VC for correct movement w/VC for slow return and Pelvic tilt w/marching 40x alternating w/intermittent VC for transverse abdominus activation w/VC back flat on table             PT Education - 03/27/19 1118    Education Details  Pt was educated to check and see when his compression shorts are going to come in due to he will not have significant reduction in the L leg until he is compressed at the appropriate level. He will start new HEP at home and be careful using UE support when needed and indicated    Person(s) Educated  Patient    Methods  Explanation    Comprehension  Verbalized understanding       PT Short Term Goals - 02/28/19 1020      PT SHORT TERM GOAL #1   Title  Pt will be independent with self MLD and HEP within 2 weeks in order to demonstrate autonomy of care.    Baseline  Pt reports that he does some exercises at home and occasoinally performing self MLD.    Time  4    Period  Weeks    Status  On-going    Target Date  04/04/19        PT Long Term Goals - 02/28/19 1021      PT LONG TERM GOAL #1   Title  Patient will have reduction of L distal thigh girth by 3 cm to demonstrate  decreased fluid build up in the L lower extremity.    Baseline  see measurements    Time  4    Period  Weeks    Status  On-going      PT LONG TERM GOAL #2   Title  Patient to be properly fitted with compression garment to wear on daily basis.    Baseline  pt has 20-30 mmHG garment but requires higher  compression to control edema.    Time  4    Period  Weeks    Status  On-going    Target Date  04/04/19      PT LONG TERM GOAL #3   Title  Patient will be independent in prevention/self-care management principles including self-massage and long term management plan for edema.    Baseline  Pt is more aware of how to manage his lymphedema but still has questions.    Time  4    Period  Weeks    Status  On-going    Target Date  04/04/19      PT LONG TERM GOAL #4   Title  Pt endurance goals will be set following 6 minute walk test.    Baseline  see below      PT LONG TERM GOAL #5   Title  Pt will improve LLIS score to 10% or less within 4 weeks to demonstrate impove quality of life with lymphedema    Baseline  11.76    Time  4    Period  Weeks    Status  On-going    Target Date  04/04/19      PT LONG TERM GOAL #6   Title  Pt will rate himself walking for 6 minutes on the bike at level 4 at 5-6 RPE within 4 weeks to demonstrate improved endurance.    Baseline  have not been able to work on this due to wrapping pt leg.    Time  4    Period  Weeks    Status  On-going    Target Date  04/04/19            Plan - 03/27/19 1116    Clinical Impression Statement  Pt presents to physical therapy this session with his compression garment donned appropriately without bunching. Pt was able to tolerate increased repetitions this session for all exercises w/o need for rest breaks increased rest breaks. Stretching was performed intermittently with exercises to help manage fatigue. HEP was updated this session with greater resistance and more upright exercises to work on dynamic standing balance and functional strength. Pt still has not recieved his compression shorts he was educated to check on his receipt and order date to see if he possibly has a tracking number to get his shorts as soon as possible. Pt continues with congestion at the L hip that is slowing down fluid flow out of the LLE. Pt  will benefit from continued POC at this time.    Personal Factors and Comorbidities  Comorbidity 2    Comorbidities  Hx prostate cancer and R lower leg sarcoma    PT Frequency  2x / week    PT Duration  4 weeks    PT Treatment/Interventions  Functional mobility training;Therapeutic activities;Therapeutic exercise;Neuromuscular re-education;Patient/family education;Manual techniques    PT Next Visit Plan  Continue progressing stregth/balance exercises for the LE, progress endurance exercises, measure circumference and look at compression shorts if pt has recieved them or help with ordering a new one.  PT Home Exercise Plan  Access Code: [email or web address]    Consulted and Agree with Plan of Care  Patient       Patient will benefit from skilled therapeutic intervention in order to improve the following deficits and impairments:  Decreased endurance, Increased edema  Visit Diagnosis: Lymphedema, not elsewhere classified  Other abnormalities of gait and mobility     Problem List Patient Active Problem List   Diagnosis Date Noted  . Major depressive disorder with single episode, in partial remission ([place]) 05/04/2018  . Atherosclerosis of aorta ([place]) 05/04/2018  . Thoracic aortic aneurysm without rupture ([place]) 05/04/2018  . Urethral stricture in male 12/20/2017  . Pulmonary nodules/lesions, multiple 12/20/2017  . Atherosclerosis of renal artery ([place]) 07/05/2017  . Chronic kidney disease, stage 3 03/21/2017  . Complication of skin graft 03/16/2017  . Squamous cell carcinoma, leg, right 01/25/2017  . Large vessel vasculitis ([place]) 06/10/2015  . H/O drug therapy 12/31/2013  . Hyperlipidemia 11/04/2013  . Delayed effect of radiation 05/06/2013  . Depression 04/01/2013  . Male sexual dysfunction 01/20/2013  . Fatigue 10/22/2012  . Hypothyroidism 07/23/2012  . History of prostate cancer   . Essential hypertension   . Tonsillar cancer ([place])   . Binocular vision disorder with diplopia  07/10/2012  . PLMD (periodic limb movement disorder) 09/13/2011  . OSA (obstructive sleep apnea) 09/13/2011  . Actinic keratosis 01/28/2011  . Vitamin B 12 deficiency 01/28/2011  . Spondylosis 08/02/2010  . Insomnia 08/02/2010    Ander Purpura, PT 03/27/2019, 12:00 PM  [place], [place], [address] Phone: [phone]   Fax:  [phone]  Name: DEMETRIOUS SKENANDORE MRN: [id number] Date of Birth: [birth year]

## 2019-04-01 ENCOUNTER — Other Ambulatory Visit: Payer: Self-pay

## 2019-04-01 ENCOUNTER — Ambulatory Visit: Payer: Medicare Other

## 2019-04-01 DIAGNOSIS — R2689 Other abnormalities of gait and mobility: Secondary | ICD-10-CM | POA: Diagnosis not present

## 2019-04-01 DIAGNOSIS — I890 Lymphedema, not elsewhere classified: Secondary | ICD-10-CM

## 2019-04-01 NOTE — Therapy (Signed)
[place], [place], [address] Phone: [phone]   Fax:  [phone]  Physical Therapy Progress Note Progress Note Reporting Period 02/28/2019 to 04/01/2019  See note below for Objective Data and Assessment of Progress/Goals.       Patient Details  Name: Philip [name] MRN: [id number] Date of Birth: July 18, 1932 Referring Provider (PT): Hollace Kinnier DO   Encounter Date: 04/01/2019  PT End of Session - 04/01/19 1108    Visit Number  17    Number of Visits  22    Date for PT Re-Evaluation  05/06/19    PT Start Time  1105    PT Stop Time  1200    PT Time Calculation (min)  55 min    Activity Tolerance  Patient tolerated treatment well    Behavior During Therapy  [facility] for tasks assessed/performed       Past Medical History:  Diagnosis Date  . Aneurysm of left renal artery (HCC)    per CT 08-20-2015 stable 52m  . Aortitis syndrome ([place]-  dr tCharlestine Night  IgG4 syndrome-- treatment prednisone- (effects abdomine)  . Basal cell carcinoma   . Bladder neck contracture   . BPH (benign prostatic hyperplasia)   . CKD (chronic kidney disease), stage III   . Complication of anesthesia    post op acute urinary retention  . DDD (degenerative disc disease)   . Foley catheter in place    placed 02-29-2019 secondary to urinary retention post surgery  . Full dentures   . GERD (gastroesophageal reflux disease)   . History of external beam radiation therapy 2002   prostate cancer  and boost with radioative prostate seed implants  . History of prostate cancer DX  2002   S/P EXTERNAL RADIATION/ RADIOACTIVE SEED IMPLANTS  2003  . History of squamous cell carcinoma excision    2012--  RIGHT LOWER EXTREM;  2019 LEFT LOWER EXTREMITIY  . HTN (hypertension)   . Hyperlipemia   . Hypothyroidism    endocrinologist-  dr bChalmers Cater-- per pt takes his own thyroid med. called NP Thyroid 10 mg daily, does not take  synthroid  . Large vessel vasculitis (HCC)    aortitis--- rheumotologist-  dr tCharlestine Night . Left renal artery stenosis (HCC)    proximal per CT 08-20-2015 but patent  . Left renal atrophy   . Macular degeneration   . Nocturia   . OSA on CPAP    [facility]-- moderate to severe OSA , AHI 16.4/h/  03-24-2017 per pt is consistant using every night  . Pulmonary nodule    [facility] CT [facility]28-[place] [facility]  LUL nodule , not mets  . Renal cyst, right   . Restless legs syndrome (RLS) 10/31/2011  . S/P radiation therapy     [facility]  . Saliva decreased   . Sarcoma of lower extremity, right ([place] [facility]  [place]   dx 02-14-2017 w/ needle core bx;  high grade pleomorphic spindle cell sarcoma , grade 2 (cT2N0M0); 03-15-2017  radical resection sarcoma tumor right lower leg  and plan radiation  . Self-catheterizes urinary bladder    QID   AND   PRN  . 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 Urethral stricture    chronic---  post urethral dilation's  . Urinary retention with incomplete bladder emptying   . Vitamin B 12 deficiency 01/28/2011  . Vitamin D deficiency     Past Surgical History:  Procedure Laterality Date  . BALLOON DILATION N/A 11/10/2014   Procedure: CYSTO BALLOON DILATION AND RETROGRADE URETHROGRAM ;  Surgeon: Bjorn Loser, MD;  Location: [facility];  Service: Urology;  Laterality: N/A;  . CATARACT EXTRACTION W/ INTRAOCULAR LENS  IMPLANT, BILATERAL    . CYSTO/ BALLOON DILATION OF  URETHRAL STRICTURE  12-25-2010  . CYSTOSCOPY WITH RETROGRADE  URETHROGRAM N/A 03/30/2016   Procedure: CYSTOSCOPY WITH RETROGRADE URETHROGRAM AND BALLOON DILATION with cystogram;  Surgeon: Bjorn Loser, MD;  Location: [facility];  Service: Urology;  Laterality: N/A;  . CYSTOSCOPY WITH URETHRAL DILATATION  05/31/2011   Procedure: CYSTOSCOPY WITH URETHRAL DILATATION;  Surgeon: Reece Packer, MD;  Location: [place];  Service: Urology;  Laterality: N/A;  BALLOON DILATION  . CYSTOSCOPY WITH URETHRAL DILATATION N/A 09/13/2016   Procedure: CYSTOSCOPY WITH URETHRAL BALLOON DILATATION;  Surgeon: Bjorn Loser, MD;  Location: [place];  Service: Urology;  Laterality: N/A;  . CYSTOSCOPY WITH URETHRAL DILATATION N/A 03/30/2017   Procedure: CYSTOSCOPY WITH BALLOON URETHRAL DILATATION;  Surgeon: Bjorn Loser, MD;  Location: [place];  Service: Urology;  Laterality: N/A;  . CYSTOSCOPY/RETROGRADE/URETEROSCOPY N/A 05/22/2012   Procedure: CYSTOSCOPY BALLOON DILATION RETROGRADE URETEROGRAM ;  Surgeon: Reece Packer, MD;  Location: [place];  Service: Urology;  Laterality: N/A;  . CYTSO/  DILATATION URETHRAL STRICTURE/  BX PROSTATIC URETHRA/  REMOVAL FOREIGN BODIES  06-25-2010   [facility]  . INGUINAL HERNIA REPAIR Right 2000  . MOHS SURGERY  01/ 2019    [facility]   left lower leg for SCC  . RADICAL RESECTION OF SARCOMA TUMOR   03-15-2017   [facility]   RIGHT LOWER LEG , CALF AREA  . RADIOACTIVE SEED IMPLANTS, PROSTATE  JAN  2003  . SKIN LESION EXCISION  07/2012   MOST  right shoulder    There were no vitals filed for this visit.  Subjective Assessment - 04/01/19 1108    Subjective  Pt states that he has been very fatigued lately. He reports that he has low B12 and has had a B12 shot a couple weeks ago. He reports he is to have 3 injections before he sees his MD again. He states that he has felt like he needed to take naps. HE has received his compression shorts in the mail and tried to  wear them a few times.    Pertinent History  Hx Prostate cancer and R lower leg sarcoma    How long can you walk comfortably?  Pt states that his legs get achy and he feels winded after walking about 40 minutes    Diagnostic tests  MRI, CT scan and doppler without significant findings.    Patient Stated Goals  I want to get this swelling down and work on my endurance.    Currently in Pain?  No/denies    Multiple Pain Sites  No            LYMPHEDEMA/ONCOLOGY QUESTIONNAIRE - 04/01/19 1113      Left Lower Extremity Lymphedema   30 cm Proximal to Suprapatella  50 cm    20 cm Proximal to Suprapatella  46.3 cm    10 cm Proximal to Suprapatella  42 cm    At Midpatella/Popliteal Crease  35 cm    30 cm Proximal to Floor at Lateral Plantar Foot  30.8 cm    20 cm Proximal to Floor at Lateral Plantar Foot  23 cm    10 cm Proximal to Floor at Lateral Malleoli  24.5 cm    5 cm Proximal to 1st MTP Joint  22 cm    Around Proximal Great Toe  9 cm           [facility]patient [facility]  Lymphedema Life Impact Scale Total Score  10.29 %           [facility] Adult PT Treatment/Exercise - 04/01/19 0001      Knee/Hip Exercises: Aerobic   Recumbent Bike  Lvl 4 6 min 2 min HR 97 O2 sat 97%, 4 min Hr 114, SPO2 96%, 6 min Hr 108, SPO2 96% RPE 8      Knee/Hip Exercises: Standing   Heel Raises  Both;1 set    Heel Raises Limitations  20 reps pt performed well w/o UE support     Forward Lunges  Right;Left;1 set    Forward Lunges Limitations  18x following demonstration w/unilateral UE support. VC for bending both knees and to not go as deep in order to not stress muscles    Side Lunges  --    Side Lunges Limitations  --    Other Standing Knee Exercises  Toe raises 20x no demonstration required this session.       Knee/Hip Exercises: Supine   Bridges  Strengthening;1 set;Both    Bridges Limitations  20 reps with good form on cueing needed.      Straight Leg Raises  Strengthening;Right;Left;1 set    Straight Leg Raises Limitations  20x good form no cueig needed.     Other Supine Knee/Hip Exercises  Lower trunk rotation stretch, Supine hamstring stretch (dynamic) 3x 10 seconds, supine modified thomas stretch 1x30 seconds with tactile cueing and help with positioning, Supine pec stretch bil hands behind head 3x 10 seconds good form throughout except needed help with thomas stretch              PT Education - 04/01/19 1119    Education Details  Pt was educated to wear compression garment and shorts throughout the day. He will set up time for the tactile people to come and help set up his pump.    Person(s) Educated  Patient    Methods  Explanation    Comprehension  Verbalized understanding       PT Short Term Goals - 04/01/19 1128      PT SHORT TERM GOAL #1   Title  Pt will be independent with self MLD and HEP within 2 weeks in order to demonstrate autonomy of care.    Baseline  Pt has just received a vasopneumatic pump he states that he plays table tennis and does some exercises at home.    Time  4    Period  Weeks    Status  On-going    Target Date  05/06/19        PT Long Term Goals - 04/01/19 1128      PT LONG TERM GOAL #1   Title  Patient will have reduction of L distal thigh girth by 3 cm to  demonstrate decreased fluid build up in the L lower extremity.    Baseline  goal met see measurements    Status  Achieved      PT LONG TERM GOAL #2   Title  Patient to be properly fitted with compression garment to wear on daily basis.    Baseline  Pt has a lower extremity garment and compression shorts he just received.    Status  Achieved      PT LONG TERM GOAL #3   Title  Patient will be independent in prevention/self-care management principles including self-massage and long term management plan for edema.    Baseline  Pt is aware of how to manage his lymphedema, he just got some equipment that he needs to help  decrease fluid that he needs to learn to use.    Time  4    Period  Weeks    Status  On-going    Target Date  05/06/19      PT LONG TERM GOAL #4   Title  see below    Baseline  see below    Time  4    Period  Weeks    Status  New    Target Date  05/06/19      PT LONG TERM GOAL #5   Title  Pt will improve LLIS score to 10% or less within 4 weeks to demonstrate impove quality of life with lymphedema    Baseline  10.29    Time  4    Period  Weeks    Status  On-going    Target Date  05/06/19      PT LONG TERM GOAL #6   Title  Pt will rate himself riding for 6 minutes on the bike at level 4 at 5-6 RPE within 4 weeks to demonstrate improved endurance.    Baseline  RPE 8 at level 4 for 6 minutes    Time  4    Period  Weeks    Status  On-going    Target Date  05/06/19            Plan - 04/01/19 1108    Clinical Impression Statement  Pt has decreased 2-3 cm and maintained this in circumferential measurements in the LLE with compression wrapping, exercise and compression garment with physical therapy using complete decongestive therapy. Pt has just recently received a pair of compression shorts to address the edema in his L hip/abdominal areathat he has not worn consistently at this time. He has just received a vasopneumatic pump in the mail that he has not yet set up or used. Continued to work on easy endurance exercises this session with high repetitions low load. Pt will benefit from 1x more this week then 1x/week for 4 weeks for a total of 5 visits more to ensure appropriate home management of lymphedema in order to decrease risk for immobility and infection related to swelling/decreased endurance.    Personal Factors and Comorbidities  Comorbidity 2    Comorbidities  Hx prostate cancer and R lower leg sarcoma    Rehab Potential  Good    PT Frequency  2x / week    PT Duration  4 weeks    PT Treatment/Interventions  Functional mobility training;Therapeutic activities;Therapeutic  exercise;Neuromuscular re-education;Patient/family education;Manual techniques    PT Next Visit Plan  Continue progressing stregth/balance exercises for the LE, progress endurance exercises, measure circumference and look at compression shorts if pt has recieved them or help with ordering  a new one.    PT Home Exercise Plan  Access Code: [email or web address]    Consulted and Agree with Plan of Care  Patient       Patient will benefit from skilled therapeutic intervention in order to improve the following deficits and impairments:  Decreased endurance, Increased edema  Visit Diagnosis: Lymphedema, not elsewhere classified  Other abnormalities of gait and mobility     Problem List Patient Active Problem List   Diagnosis Date Noted  . Major depressive disorder with single episode, in partial remission ([place]) 05/04/2018  . Atherosclerosis of aorta ([place]) 05/04/2018  . Thoracic aortic aneurysm without rupture ([place]) 05/04/2018  . Urethral stricture in male 12/20/2017  . Pulmonary nodules/lesions, multiple 12/20/2017  . Atherosclerosis of renal artery ([place]) 07/05/2017  . Chronic kidney disease, stage 3 03/21/2017  . Complication of skin graft 03/16/2017  . Squamous cell carcinoma, leg, right 01/25/2017  . Large vessel vasculitis ([place]) 06/10/2015  . H/O drug therapy 12/31/2013  . Hyperlipidemia 11/04/2013  . Delayed effect of radiation 05/06/2013  . Depression 04/01/2013  . Male sexual dysfunction 01/20/2013  . Fatigue 10/22/2012  . Hypothyroidism 07/23/2012  . History of prostate cancer   . Essential hypertension   . Tonsillar cancer ([place])   . Binocular vision disorder with diplopia 07/10/2012  . PLMD (periodic limb movement disorder) 09/13/2011  . OSA (obstructive sleep apnea) 09/13/2011  . Actinic keratosis 01/28/2011  . Vitamin B 12 deficiency 01/28/2011  . Spondylosis 08/02/2010  . Insomnia 08/02/2010    Ander Purpura, PT 04/01/2019, 12:04 PM  [place], [place], [address] Phone: [phone]   Fax:  [phone]  Name: GJON LETARTE MRN: [id number] Date of Birth: Oct 31, 1932

## 2019-04-03 ENCOUNTER — Ambulatory Visit: Payer: Medicare Other

## 2019-04-03 ENCOUNTER — Other Ambulatory Visit: Payer: Self-pay

## 2019-04-03 DIAGNOSIS — R2689 Other abnormalities of gait and mobility: Secondary | ICD-10-CM | POA: Diagnosis not present

## 2019-04-03 DIAGNOSIS — I890 Lymphedema, not elsewhere classified: Secondary | ICD-10-CM | POA: Diagnosis not present

## 2019-04-03 NOTE — Therapy (Signed)
[place], [place], [address] Phone: [phone]   Fax:  [phone]  Physical Therapy Treatment  Patient Details  Name: Philip [name] MRN: [id number] Date of Birth: 07-03-1932 Referring Provider (PT): Hollace Kinnier DO   Encounter Date: 04/03/2019  PT End of Session - 04/03/19 1103    Visit Number  18    Number of Visits  22    Date for PT Re-Evaluation  05/06/19    PT Start Time  1100    PT Stop Time  1156    PT Time Calculation (min)  56 min    Activity Tolerance  Patient tolerated treatment well    Behavior During Therapy  [facility] for tasks assessed/performed       Past Medical History:  Diagnosis Date  . Aneurysm of left renal artery (HCC)    per CT 08-20-2015 stable 72m  . [place]itis [place]  dr tCharlestine Night  IgG4 syndrome-- treatment prednisone- (effects abdomine)  . Basal cell carcinoma   . Bladder neck contracture   . BPH (benign prostatic hyperplasia)   . CKD (chronic kidney disease), stage III   . Complication of anesthesia    post op acute urinary retention  . DDD (degenerative disc disease)   . Foley catheter in place    placed 02-29-2019 secondary to urinary retention post surgery  . Full dentures   . GERD (gastroesophageal reflux disease)   . History of external beam radiation therapy 2002   prostate cancer  and boost with radioative prostate seed implants  . History of prostate cancer DX  2002   S/P EXTERNAL RADIATION/ RADIOACTIVE SEED IMPLANTS  2003  . History of squamous cell carcinoma excision    2012--  RIGHT LOWER EXTREM;  2019 LEFT LOWER EXTREMITIY  . HTN (hypertension)   . Hyperlipemia   . Hypothyroidism    endocrinologist-  dr bChalmers Cater-- per pt takes his own thyroid med. called NP Thyroid 10 mg daily, does not take synthroid  . Large vessel vasculitis (HCC)    aortitis--- rheumotologist-  dr tCharlestine Night . Left renal artery stenosis (HCC)    proximal  per CT 08-20-2015 but patent  . Left renal atrophy   . Macular degeneration   . Nocturia   . OSA on CPAP    [facility]-- moderate to severe OSA , AHI 16.4/h/  03-24-2017 per pt is consistant using every night  . Pulmonary nodule    [place] [facility]  LUL nodule , not mets  . Renal cyst, right   . Restless legs syndrome (RLS) 10/31/2011  . S/P radiation therapy     [facility]  . Saliva decreased   . Sarcoma of [place], right [place]  [place]   dx 02-14-2017 w/ needle core bx;  high grade pleomorphic spindle cell sarcoma , grade 2 (cT2N0M0); 03-15-2017  radical resection sarcoma tumor right lower leg  and plan radiation  . Self-catheterizes urinary bladder    QID   AND   PRN  . Tonsillar cancer ([place] 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+) (Stage IVA)---- dx oct 2013  ----s/p concurent chemo and radiation/  ended 01-26-2012-- no surgical intervention---  residuals ( dry mouth, decreased saliva)   [place]  [place]--  HX OF --  NO RECURRENCE  . Urethral stricture    chronic---  post urethral dilation's  . Urinary retention with incomplete bladder emptying   . Vitamin B 12 deficiency 01/28/2011  . Vitamin D deficiency     Past Surgical History:  Procedure Laterality Date  . BALLOON DILATION N/A 11/10/2014   Procedure: CYSTO BALLOON DILATION AND RETROGRADE URETHROGRAM ;  Surgeon: Bjorn Loser, MD;  Location: [facility];  Service: Urology;  Laterality: N/A;  . CATARACT EXTRACTION W/ INTRAOCULAR LENS  IMPLANT, BILATERAL    . CYSTO/ BALLOON DILATION OF  URETHRAL STRICTURE  12-25-2010  . CYSTOSCOPY WITH RETROGRADE URETHROGRAM N/A 03/30/2016   Procedure: CYSTOSCOPY WITH RETROGRADE URETHROGRAM AND BALLOON DILATION with cystogram;  Surgeon: Bjorn Loser,  MD;  Location[facility];  Service: Urology;  Laterality: N/A;  . CYSTOSCOPY WITH URETHRAL DILATATION  05/31/2011   Procedure: CYSTOSCOPY WITH URETHRAL DILATATION;  Surgeon: Reece Packer, MD;  Location: [place];  Service: Urology;  Laterality: N/A;  BALLOON DILATION  . CYSTOSCOPY WITH URETHRAL DILATATION N/A 09/13/2016   Procedure: CYSTOSCOPY WITH URETHRAL BALLOON DILATATION;  Surgeon: Bjorn Loser, MD;  Location: [place];  Service: Urology;  Laterality: N/A;  . CYSTOSCOPY WITH URETHRAL DILATATION N/A 03/30/2017   Procedure: CYSTOSCOPY WITH BALLOON URETHRAL DILATATION;  Surgeon: Bjorn Loser, MD;  Location: [place];  Service: Urology;  Laterality: N/A;  . CYSTOSCOPY/RETROGRADE/URETEROSCOPY N/A 05/22/2012   Procedure: CYSTOSCOPY BALLOON DILATION RETROGRADE URETEROGRAM ;  Surgeon: Reece Packer, MD;  Location: [place];  Service: Urology;  Laterality: N/A;  . CYTSO/  DILATATION URETHRAL STRICTURE/  BX PROSTATIC URETHRA/  REMOVAL FOREIGN BODIES  06-25-2010   [facility]  . INGUINAL HERNIA REPAIR Right 2000  . MOHS SURGERY  01/ 2019    [facility]   left lower leg for SCC  . RADICAL RESECTION OF SARCOMA TUMOR   03-15-2017   [facility]   RIGHT LOWER LEG , CALF AREA  . RADIOACTIVE SEED IMPLANTS, PROSTATE  JAN  2003  . SKIN LESION EXCISION  07/2012   MOST  right shoulder    There were no vitals filed for this visit.  Subjective Assessment - 04/03/19 1104    Subjective  Pt states that he hasn't been sleeping well sinc he stopped flying and is thinking about seeing a specialist for PTSD which adds to his fatigue. He has been wearing his compression shorts and states that his thigh has improved in swelling.    Pertinent History  Hx Prostate cancer and R lower leg sarcoma    How long can you walk comfortably?  Pt states that his legs get achy and he feels winded after walking about 40 minutes    Diagnostic tests   MRI, CT scan and doppler without significant findings.    Patient Stated Goals  I want to get this swelling down and work on my endurance.    Currently in Pain?  No/denies    Multiple Pain Sites  No                  Outpatient [facility]  Lymphedema Life Impact Scale Total Score  10.29 %           [facility] Adult PT Treatment/Exercise - 04/03/19 0001      Knee/Hip Exercises: Aerobic   Recumbent Bike  Lvl 3 8 min no shortness of breathe noted.       Knee/Hip Exercises:  Standing   Heel Raises  Both;1 set    Heel Raises Limitations  20 reps pt performed well w/o UE support     Forward Lunges  Right;Left;1 set    Forward Lunges Limitations  20x w/o UE support; discussed using UE support at home.     Side Lunges  Right;Left;1 set    Side Lunges Limitations  20x with UE support short demonstration for correct movement and VC for performing with knee lined up with toes to decrease stress on the knee.     Forward Step Up  1 set;Right;Left    Forward Step Up Limitations  15x 6" step w/o SBA this session good control, slight increase in momentum in last 1-2 inches of movement with step down     Functional Squat  1 set;20 reps    Functional Squat Limitations  sit to stand from raised pllinth table arms crossed good form; improvement in last 10 degrees of descent with better control today.     Other Standing Knee Exercises  Toe raises 20x no demonstration required this session.     Other Standing Knee Exercises  retro and side to side gait in the hall 3 laps with SBA pt began to fatigue on the last lap with greater side to side LOB w/o need for assistance to recover using hip strategy.       Knee/Hip Exercises: Supine   Bridges  Strengthening;1 set;Both    Bridges Limitations  20 reps with short demonstration good form    Straight Leg Raises  Strengthening;Right;Left;1 set    Straight Leg Raises Limitations  20x good form pt  is able to continuously perform with good ROM at this point.     Other Supine Knee/Hip Exercises  Lower trunk rotation stretch, Supine hamstring stretch (dynamic) 3x 10 seconds, supine modified thomas stretch 1x30 seconds with tactile cueing and help with positioning, Supine pec stretch bil hands behind head 3x 10 seconds good form throughout no need for help with Marcello Moores stretch this session.     Other Supine Knee/Hip Exercises  Pelvic tilt w/marching 40x alternating w/intermittent VC for transverse abdominus activation w/VC back flat on table             PT Education - 04/03/19 1139    Education Details  Pt will continue to wear his compression garment and shorts. He will look to see if [employer] has called him to show him how to set up the pump.    Person(s) Educated  Patient    Methods  Explanation    Comprehension  Verbalized understanding       PT Short Term Goals - 04/01/19 1128      PT SHORT TERM GOAL #1   Title  Pt will be independent with self MLD and HEP within 2 weeks in order to demonstrate autonomy of care.    Baseline  Pt has just received a vasopneumatic pump he states that he plays table tennis and does some exercises at home.    Time  4    Period  Weeks    Status  On-going    Target Date  05/06/19        PT Long Term Goals - 04/01/19 1128      PT LONG TERM GOAL #1   Title  Patient will have reduction of L distal thigh girth by 3 cm to demonstrate decreased fluid build up in the L lower extremity.    Baseline  goal met see  measurements    Status  Achieved      PT LONG TERM GOAL #2   Title  Patient to be properly fitted with compression garment to wear on daily basis.    Baseline  Pt has a lower extremity garment and compression shorts he just received.    Status  Achieved      PT LONG TERM GOAL #3   Title  Patient will be independent in prevention/self-care management principles including self-massage and long term management plan for edema.     Baseline  Pt is aware of how to manage his lymphedema, he just got some equipment that he needs to help decrease fluid that he needs to learn to use.    Time  4    Period  Weeks    Status  On-going    Target Date  05/06/19      PT LONG TERM GOAL #4   Title  see below    Baseline  see below    Time  4    Period  Weeks    Status  New    Target Date  05/06/19      PT LONG TERM GOAL #5   Title  Pt will improve LLIS score to 10% or less within 4 weeks to demonstrate impove quality of life with lymphedema    Baseline  10.29    Time  4    Period  Weeks    Status  On-going    Target Date  05/06/19      PT LONG TERM GOAL #6   Title  Pt will rate himself riding for 6 minutes on the bike at level 4 at 5-6 RPE within 4 weeks to demonstrate improved endurance.    Baseline  RPE 8 at level 4 for 6 minutes    Time  4    Period  Weeks    Status  On-going    Target Date  05/06/19            Plan - 04/03/19 1103    Clinical Impression Statement  Pt continues to wear his compression. He presents in his compression today. Pt exercises were increased today with more stanidng ambulating activities requiring seated rest break between activities due to fatigue and increased LOB with fatiuge. Pt immediately uses hip strategy with LOB by passing ankle strategy. Pt will benefit from continued POC at this time to address endurance and ensure he has adequate equipment at home to control lymphedema.    Personal Factors and Comorbidities  Comorbidity 2    Comorbidities  Hx prostate cancer and R lower leg sarcoma    Rehab Potential  Good    PT Frequency  2x / week    PT Duration  4 weeks    PT Treatment/Interventions  Functional mobility training;Therapeutic activities;Therapeutic exercise;Neuromuscular re-education;Patient/family education;Manual techniques    PT Next Visit Plan  Continue progressing stregth/balance exercises for the LE, progress endurance exercises, measure circumference and look at  compression shorts if pt has recieved them or help with ordering a new one.    PT Home Exercise Plan  Access Code: [email or web address]    Consulted and Agree with Plan of Care  Patient       Patient will benefit from skilled therapeutic intervention in order to improve the following deficits and impairments:  Decreased endurance, Increased edema  Visit Diagnosis: Lymphedema, not elsewhere classified  Other abnormalities of gait and mobility     Problem List Patient Active Problem  List   Diagnosis Date Noted  . Major depressive disorder with single episode, in partial remission ([place]) 05/04/2018  . Atherosclerosis of aorta ([place]) 05/04/2018  . Thoracic aortic aneurysm without rupture ([place]) 05/04/2018  . Urethral stricture in male 12/20/2017  . Pulmonary nodules/lesions, multiple 12/20/2017  . Atherosclerosis of renal artery ([place]) 07/05/2017  . Chronic kidney disease, stage 3 03/21/2017  . Complication of skin graft 03/16/2017  . Squamous cell carcinoma, leg, right 01/25/2017  . Large vessel vasculitis ([place]) 06/10/2015  . H/O drug therapy 12/31/2013  . Hyperlipidemia 11/04/2013  . Delayed effect of radiation 05/06/2013  . Depression 04/01/2013  . Male sexual dysfunction 01/20/2013  . Fatigue 10/22/2012  . Hypothyroidism 07/23/2012  . History of prostate cancer   . Essential hypertension   . Tonsillar cancer ([place])   . Binocular vision disorder with diplopia 07/10/2012  . PLMD (periodic limb movement disorder) 09/13/2011  . OSA (obstructive sleep apnea) 09/13/2011  . Actinic keratosis 01/28/2011  . Vitamin B 12 deficiency 01/28/2011  . Spondylosis 08/02/2010  . Insomnia 08/02/2010    Ander Purpura, PT 04/03/2019, 11:59 AM  [place], [place], [address] Phone: [phone]   Fax:  [phone]  Name: Philip [name] MRN: [id number] Date of Birth: 07-27-1932

## 2019-04-11 ENCOUNTER — Ambulatory Visit: Payer: Medicare Other

## 2019-04-11 ENCOUNTER — Other Ambulatory Visit: Payer: Self-pay | Admitting: Internal Medicine

## 2019-04-11 ENCOUNTER — Other Ambulatory Visit: Payer: Self-pay

## 2019-04-11 DIAGNOSIS — G4733 Obstructive sleep apnea (adult) (pediatric): Secondary | ICD-10-CM

## 2019-04-11 DIAGNOSIS — R5382 Chronic fatigue, unspecified: Secondary | ICD-10-CM

## 2019-04-11 DIAGNOSIS — R2689 Other abnormalities of gait and mobility: Secondary | ICD-10-CM

## 2019-04-11 DIAGNOSIS — M316 Other giant cell arteritis: Secondary | ICD-10-CM

## 2019-04-11 DIAGNOSIS — I890 Lymphedema, not elsewhere classified: Secondary | ICD-10-CM

## 2019-04-11 DIAGNOSIS — E538 Deficiency of other specified B group vitamins: Secondary | ICD-10-CM

## 2019-04-11 NOTE — Therapy (Signed)
[place], [place], [address] Phone: [phone]   Fax:  [phone]  Physical Therapy Treatment  Patient Details  Name: Philip [name] MRN: [id number] Date of Birth: 06-01-1932 Referring Provider (PT): Hollace Kinnier DO   Encounter Date: 04/11/2019  PT End of Session - 04/11/19 1104    Visit Number  19    Number of Visits  22    Date for PT Re-Evaluation  05/06/19    PT Start Time  1107    PT Stop Time  1200    PT Time Calculation (min)  53 min    Activity Tolerance  Patient tolerated treatment well    Behavior During Therapy  [facility] for tasks assessed/performed       Past Medical History:  Diagnosis Date  . Aneurysm of left renal artery (HCC)    per CT 08-20-2015 stable 46m  . [place]itis syndrome ([place]-  dr tCharlestine Night  IgG4 syndrome-- treatment prednisone- (effects abdomine)  . Basal cell carcinoma   . Bladder neck contracture   . BPH (benign prostatic hyperplasia)   . CKD (chronic kidney disease), stage III   . Complication of anesthesia    post op acute urinary retention  . DDD (degenerative disc disease)   . Foley catheter in place    placed 02-29-2019 secondary to urinary retention post surgery  . Full dentures   . GERD (gastroesophageal reflux disease)   . History of external beam radiation therapy 2002   prostate cancer  and boost with radioative prostate seed implants  . History of prostate cancer DX  2002   S/P EXTERNAL RADIATION/ RADIOACTIVE SEED IMPLANTS  2003  . History of squamous cell carcinoma excision    2012--  RIGHT LOWER EXTREM;  2019 LEFT LOWER EXTREMITIY  . HTN (hypertension)   . Hyperlipemia   . Hypothyroidism    endocrinologist-  dr bChalmers Cater-- per pt takes his own thyroid med. called NP Thyroid 10 mg daily, does not take synthroid  . Large vessel vasculitis (HCC)    aortitis--- rheumotologist-  dr tCharlestine Night . Left renal artery stenosis (HCC)    proximal  per CT 08-20-2015 but patent  . Left renal atrophy   . Macular degeneration   . Nocturia   . OSA on CPAP    [facility]-- moderate to severe OSA , AHI 16.4/h/  03-24-2017 per pt is consistant using every night  . Pulmonary nodule    [place]  L[place] nodule , not mets  . Renal cyst, right   . Restless legs syndrome (RLS) 10/31/2011  . S/P radiation therapy     [facility]  . Saliva decreased   . Sarcoma of lower extremity, right ([place]  [place]is [place]   dx 02-14-2017 w/ needle core bx;  high grade pleomorphic spindle cell sarcoma , grade 2 (cT2N0M0); 03-15-2017  radical resection sarcoma tumor right lower leg  and plan radiation  . Self-catheterizes urinary bladder    QID   AND   PRN  . 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 Urethral stricture    chronic---  post urethral dilation's  . Urinary retention with incomplete bladder emptying   . Vitamin B 12 deficiency 01/28/2011  . Vitamin D deficiency     Past Surgical History:  Procedure Laterality Date  . BALLOON DILATION N/A 11/10/2014   Procedure: CYSTO BALLOON DILATION AND RETROGRADE URETHROGRAM ;  Surgeon: Bjorn Loser, MD;  Location[facility];  Service: Urology;  Laterality: N/A;  . CATARACT EXTRACTION W/ INTRAOCULAR LENS  IMPLANT, BILATERAL    . CYSTO/ BALLOON DILATION OF  URETHRAL STRICTURE  12-25-2010  . CYSTOSCOPY WITH RETROGRADE URETHROGRAM N/A 03/30/2016   Procedure: CYSTOSCOPY WITH RETROGRADE URETHROGRAM AND BALLOON DILATION with cystogram;  Surgeon: Bjorn Loser,  MD;  Location: [facility];  Service: Urology;  Laterality: N/A;  . CYSTOSCOPY WITH URETHRAL DILATATION  05/31/2011   Procedure: CYSTOSCOPY WITH URETHRAL DILATATION;  Surgeon: Reece Packer, MD;  Location: [place];  Service: Urology;  Laterality: N/A;  BALLOON DILATION  . CYSTOSCOPY WITH URETHRAL DILATATION N/A 09/13/2016   Procedure: CYSTOSCOPY WITH URETHRAL BALLOON DILATATION;  Surgeon: Bjorn Loser, MD;  Location: [place];  Service: Urology;  Laterality: N/A;  . CYSTOSCOPY WITH URETHRAL DILATATION N/A 03/30/2017   Procedure: CYSTOSCOPY WITH BALLOON URETHRAL DILATATION;  Surgeon: Bjorn Loser, MD;  Location: [place];  Service: Urology;  Laterality: N/A;  . CYSTOSCOPY/RETROGRADE/URETEROSCOPY N/A 05/22/2012   Procedure: CYSTOSCOPY BALLOON DILATION RETROGRADE URETEROGRAM ;  Surgeon: Reece Packer, MD;  Location: [place];  Service: Urology;  Laterality: N/A;  . CYTSO/  DILATATION URETHRAL STRICTURE/  BX PROSTATIC URETHRA/  REMOVAL FOREIGN BODIES  06-25-2010   [facility]  . INGUINAL HERNIA REPAIR Right 2000  . MOHS SURGERY  01/ 2019    [facility]   left lower leg for SCC  . RADICAL RESECTION OF SARCOMA TUMOR   03-15-2017   [facility]   RIGHT LOWER LEG , CALF AREA  . RADIOACTIVE SEED IMPLANTS, PROSTATE  JAN  2003  . SKIN LESION EXCISION  07/2012   MOST  right shoulder    There were no vitals filed for this visit.  Subjective Assessment - 04/11/19 1104    Subjective  Pt states that he has received his pump and has been using it at home. He has been using it since Monday.    Pertinent History  Hx Prostate cancer and R lower leg sarcoma    How long can you walk comfortably?  Pt states that his legs get achy and he feels winded after walking about 40 minutes    Diagnostic tests  MRI, CT scan and doppler without significant findings.    Patient Stated Goals  I want to get this swelling down and work on my  endurance.    Currently in Pain?  No/denies            LYMPHEDEMA/ONCOLOGY QUESTIONNAIRE - 04/11/19 1125      Left Lower Extremity Lymphedema   30 cm Proximal to Suprapatella  53.8 cm    20 cm Proximal to Suprapatella  48 cm    10 cm Proximal to Suprapatella  42.4 cm    At Midpatella/Popliteal Crease  37 cm    30 cm Proximal to Floor at Lateral Plantar Foot  32.8 cm    20 cm Proximal to Floor at Lateral Plantar Foot  24.5 cm    10 cm Proximal to Floor at Lateral Malleoli  25.2 cm  5 cm Proximal to 1st MTP Joint  23.5 cm    Around Proximal Great Toe  9.2 cm           [facility]patient [facility]  Lymphedema Life Impact Scale Total Score  10.29 %           [facility] Adult PT Treatment/Exercise - 04/11/19 0001      Knee/Hip Exercises: Aerobic   Recumbent Bike  Lvl 3 8 min initialy HR 69, SPO2 96%, at 2 min HR 91, SPO2 91%, at 4 min HR 98, SPO2 94%, at 6 min HR 105 SPO2 93%, at 8 min HR 103 SPO2 93%      Knee/Hip Exercises: Standing   Other Standing Knee Exercises  retro gait 3 laps quickly with SBA pt had no LOB this session had to stop x 1x to regain balance but was able to perform w/o stepping strategy. He continues with poor ankle strategy for balance.       Knee/Hip Exercises: Supine   Bridges  Strengthening;1 set;Both    Bridges Limitations  20 reps with good form.     Other Supine Knee/Hip Exercises  Lower trunk rotation stretch, Supine hamstring stretch (dynamic) 3x 10 seconds, supine modified thomas stretch 1x30 seconds with 1x VC for correct form due to pt wants to stay sitting up to stretch opposite hamstring, Supine pec stretch bil hands behind head 3x 10 seconds good form throughout       Manual Therapy   Manual Therapy  Edema management    Edema Management  circumferential measurments and helped pt don sock with increased ease             PT Education - 04/11/19 1147    Education Details  Pt  will continue to wear his compression and use his pump at home. Pt doffed sock to be measured and then donned his sock with great difficulty pt was educated on how he can don his sock with decreased energy out put including folding sock over and getting the heel of the sock over the heel of his foot before beginning to pull up.    Person(s) Educated  Patient    Methods  Explanation    Comprehension  Verbalized understanding       PT Short Term Goals - 04/01/19 1128      PT SHORT TERM GOAL #1   Title  Pt will be independent with self MLD and HEP within 2 weeks in order to demonstrate autonomy of care.    Baseline  Pt has just received a vasopneumatic pump he states that he plays table tennis and does some exercises at home.    Time  4    Period  Weeks    Status  On-going    Target Date  05/06/19        PT Long Term Goals - 04/01/19 1128      PT LONG TERM GOAL #1   Title  Patient will have reduction of L distal thigh girth by 3 cm to demonstrate decreased fluid build up in the L lower extremity.    Baseline  goal met see measurements    Status  Achieved      PT LONG TERM GOAL #2   Title  Patient to be properly fitted with compression garment to wear on daily basis.    Baseline  Pt has a lower extremity garment and compression shorts he just received.    Status  Achieved      PT LONG TERM GOAL #3   Title  Patient will be independent in prevention/self-care management principles including self-massage and long term management plan for edema.    Baseline  Pt is aware of how to manage his lymphedema, he just got some equipment that he needs to help decrease fluid that he needs to learn to use.    Time  4    Period  Weeks    Status  On-going    Target Date  05/06/19      PT LONG TERM GOAL #4   Title  see below    Baseline  see below    Time  4    Period  Weeks    Status  New    Target Date  05/06/19      PT LONG TERM GOAL #5   Title  Pt will improve LLIS score to 10% or less  within 4 weeks to demonstrate impove quality of life with lymphedema    Baseline  10.29    Time  4    Period  Weeks    Status  On-going    Target Date  05/06/19      PT LONG TERM GOAL #6   Title  Pt will rate himself riding for 6 minutes on the bike at level 4 at 5-6 RPE within 4 weeks to demonstrate improved endurance.    Baseline  RPE 8 at level 4 for 6 minutes    Time  4    Period  Weeks    Status  On-going    Target Date  05/06/19            Plan - 04/11/19 1104    Clinical Impression Statement  Pt presents with slight increase in edema in the L lower extremity this session most likely due to warmer weather. He has been wearing his compression and just started using the pump on Monday. Pt was supervised donning compression garment today with a few tips including folding over and getting the heel over his heel before pulling up in order to help him speed up his process and use less energy due to pt continues to feel fatigued with increased movement. Pt became diaphoretic donning his compression garment today; with pt permission his MD Dr. Hollace Kinnier was contacted for possible referral to cardiology. Pt was able to perform gait activities w/o shortness of breathe today after rest from donning sock. Pt will benefit from continued POC at this time.    Personal Factors and Comorbidities  Comorbidity 2    Comorbidities  Hx prostate cancer and R lower leg sarcoma    Rehab Potential  Good    PT Frequency  2x / week    PT Duration  4 weeks    PT Treatment/Interventions  Functional mobility training;Therapeutic activities;Therapeutic exercise;Neuromuscular re-education;Patient/family education;Manual techniques    PT Next Visit Plan  Continue progressing stregth/balance exercises for the LE, progress endurance exercises, measure circumference, ask if he is still using pump    PT Home Exercise Plan  Access Code: [email or web address]    Consulted and Agree with Plan of Care  Patient       Patient  will benefit from skilled therapeutic intervention in order to improve the following deficits and impairments:  Decreased endurance, Increased edema  Visit Diagnosis: Lymphedema, not elsewhere classified  Other abnormalities of gait and mobility     Problem List Patient Active Problem List   Diagnosis Date Noted  .  Major depressive disorder with single episode, in partial remission ([place]) 05/04/2018  . Atherosclerosis of aorta ([place]) 05/04/2018  . Thoracic aortic aneurysm without rupture ([place]) 05/04/2018  . Urethral stricture in male 12/20/2017  . Pulmonary nodules/lesions, multiple 12/20/2017  . Atherosclerosis of renal artery ([place]) 07/05/2017  . Chronic kidney disease, stage 3 03/21/2017  . Complication of skin graft 03/16/2017  . Squamous cell carcinoma, leg, right 01/25/2017  . Large vessel vasculitis ([place]) 06/10/2015  . H/O drug therapy 12/31/2013  . Hyperlipidemia 11/04/2013  . Delayed effect of radiation 05/06/2013  . Depression 04/01/2013  . Male sexual dysfunction 01/20/2013  . Fatigue 10/22/2012  . Hypothyroidism 07/23/2012  . History of prostate cancer   . Essential hypertension   . Tonsillar cancer ([place])   . Binocular vision disorder with diplopia 07/10/2012  . PLMD (periodic limb movement disorder) 09/13/2011  . OSA (obstructive sleep apnea) 09/13/2011  . Actinic keratosis 01/28/2011  . Vitamin B 12 deficiency 01/28/2011  . Spondylosis 08/02/2010  . Insomnia 08/02/2010    Ander Purpura, PT 04/11/2019, 12:05 PM  [place], [place], [address] Phone: [phone]   Fax:  [phone]  Name: Philip [name] MRN: [id number] Date of Birth: February 01, 1932

## 2019-04-18 ENCOUNTER — Ambulatory Visit: Payer: Medicare Other | Attending: Internal Medicine

## 2019-04-18 ENCOUNTER — Other Ambulatory Visit: Payer: Self-pay

## 2019-04-18 DIAGNOSIS — I890 Lymphedema, not elsewhere classified: Secondary | ICD-10-CM | POA: Diagnosis not present

## 2019-04-18 DIAGNOSIS — R2689 Other abnormalities of gait and mobility: Secondary | ICD-10-CM | POA: Diagnosis not present

## 2019-04-18 NOTE — Therapy (Addendum)
[place], [place], [address] Phone: [phone]   Fax:  [phone]  Physical Therapy 10th visit Note  Reporting Period 04/01/2019 to 04/18/2019  See note below for Objective Data and Assessment of Progress/Goals.       Patient Details  Name: Philip [name] MRN: [id number] Date of Birth: 1932/11/25 Referring Provider (PT): Hollace Kinnier DO   Encounter Date: 04/18/2019  PT End of Session - 04/18/19 1207    Visit Number  20    Number of Visits  22    Date for PT Re-Evaluation  05/06/19    PT Start Time  1110    PT Stop Time  1206    PT Time Calculation (min)  56 min    Activity Tolerance  Patient tolerated treatment well    Behavior During Therapy  [facility] for tasks assessed/performed       Past Medical History:  Diagnosis Date  . Aneurysm of left renal artery (HCC)    per CT 08-20-2015 stable 19m  . Aortitis syndrome (HHarding rheumotologist-  dr tCharlestine Night  IgG4 syndrome-- treatment prednisone- (effects abdomine)  . Basal cell carcinoma   . Bladder neck contracture   . BPH (benign prostatic hyperplasia)   . CKD (chronic kidney disease), stage III   . Complication of anesthesia    post op acute urinary retention  . DDD (degenerative disc disease)   . Foley catheter in place    placed 02-29-2019 secondary to urinary retention post surgery  . Full dentures   . GERD (gastroesophageal reflux disease)   . History of external beam radiation therapy 2002   prostate cancer  and boost with radioative prostate seed implants  . History of prostate cancer DX  2002   S/P EXTERNAL RADIATION/ RADIOACTIVE SEED IMPLANTS  2003  . History of squamous cell carcinoma excision    2012--  RIGHT LOWER EXTREM;  2019 LEFT LOWER EXTREMITIY  . HTN (hypertension)   . Hyperlipemia   . Hypothyroidism    endocrinologist-  dr bChalmers Cater-- per pt takes his own thyroid med. called NP Thyroid 10 mg daily, does not take synthroid   . Large vessel vasculitis (HCC)    aortitis--- rheumotologist-  dr tCharlestine Night . Left renal artery stenosis (HCC)    proximal per CT 08-20-2015 but patent  . Left renal atrophy   . Macular degeneration   . Nocturia   . OSA on CPAP    [facility]-- moderate to severe OSA , AHI 16.4/h/  03-24-2017 per pt is consistant using every night  . Pulmonary nodule    [facility] [place] [facility] [facility] [place]  LUL nodule , not mets  . Renal cyst, right   . Restless legs syndrome (RLS) 10/31/2011  . S/P radiation therapy     [facility]  . Saliva decreased   . Sarcoma of lower extremity, right ([place] on[place]goist-  [place]is [place]   dx 02-14-2017 w/ needle core bx;  high grade pleomorphic spindle cell sarcoma , grade 2 (cT2N0M0); 03-15-2017  radical resection sarcoma tumor right lower leg  and plan radiation  . Self-catheterizes urinary bladder    QID   AND   PRN  . 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 Urethral stricture    chronic---  post urethral dilation's  . Urinary retention with incomplete bladder emptying   . Vitamin B 12 deficiency 01/28/2011  . Vitamin D deficiency     Past Surgical History:  Procedure Laterality Date  . BALLOON DILATION N/A 11/10/2014   Procedure: CYSTO BALLOON DILATION AND RETROGRADE URETHROGRAM ;  Surgeon: Bjorn Loser, MD;  Location: [facility];  Service: Urology;  Laterality: N/A;  . CATARACT EXTRACTION W/ INTRAOCULAR LENS  IMPLANT, BILATERAL    . CYSTO/ BALLOON DILATION OF  URETHRAL STRICTURE  12-25-2010  . CYSTOSCOPY WITH RETROGRADE URETHROGRAM N/A  03/30/2016   Procedure: CYSTOSCOPY WITH RETROGRADE URETHROGRAM AND BALLOON DILATION with cystogram;  Surgeon: Bjorn Loser, MD;  Location: [facility];  Service: Urology;  Laterality: N/A;  . CYSTOSCOPY WITH URETHRAL DILATATION  05/31/2011   Procedure: CYSTOSCOPY WITH URETHRAL DILATATION;  Surgeon: Reece Packer, MD;  Location: [place];  Service: Urology;  Laterality: N/A;  BALLOON DILATION  . CYSTOSCOPY WITH URETHRAL DILATATION N/A 09/13/2016   Procedure: CYSTOSCOPY WITH URETHRAL BALLOON DILATATION;  Surgeon: Bjorn Loser, MD;  Location: [place];  Service: Urology;  Laterality: N/A;  . CYSTOSCOPY WITH URETHRAL DILATATION N/A 03/30/2017   Procedure: CYSTOSCOPY WITH BALLOON URETHRAL DILATATION;  Surgeon: Bjorn Loser, MD;  Location: [place];  Service: Urology;  Laterality: N/A;  . CYSTOSCOPY/RETROGRADE/URETEROSCOPY N/A 05/22/2012   Procedure: CYSTOSCOPY BALLOON DILATION RETROGRADE URETEROGRAM ;  Surgeon: Reece Packer, MD;  Location: [place];  Service: Urology;  Laterality: N/A;  . CYTSO/  DILATATION URETHRAL STRICTURE/  BX PROSTATIC URETHRA/  REMOVAL FOREIGN BODIES  06-25-2010   [facility]  . INGUINAL HERNIA REPAIR Right 2000  . MOHS SURGERY  01/ 2019    [facility]   left lower leg for SCC  . RADICAL RESECTION OF SARCOMA TUMOR   03-15-2017   [facility]   RIGHT LOWER LEG , CALF AREA  . RADIOACTIVE SEED IMPLANTS, PROSTATE  JAN  2003  . SKIN LESION EXCISION  07/2012   MOST  right shoulder    There were no vitals filed for this visit.  Subjective Assessment - 04/18/19 1118    Subjective  I'm using my compression pump every night. I'm not sure it's getting lower abdominal region well. I had an appt made with a cardiologist for next week on 04/26/19.    Pertinent History  Hx Prostate cancer and R lower leg sarcoma    Patient Stated Goals  I want to get this swelling down and work on my endurance.    Currently  in Pain?  No/denies         [facility] PT Assessment - 04/18/19 0001      Sit to Stand   Comments  14 reps in 30 sec, HR 111 bpm and SpO2 95% after              [facility]  Lymphedema Life Impact Scale Total Score  10.29 %           [facility] Adult PT Treatment/Exercise - 04/18/19 0001      Neuro Re-ed    Neuro Re-ed Details   On Airex at back of bike: Hip extension 15x bil, bil tandem stance 1x30 sec each each and  then bil SLS x30 sec each; then on 4" step with airex x10 each with VCs for fingertip support for improved technique. In hallway: Front and retro heel-toe walking, fornt and retro toe walking, side stepping with deep squat, and bil grape[place] all with fingertip support on wall throughout and HR 124 bpm and SpO2 93% after, then HR 100 bpm and SpO2 96% 1 minute after rest and deep breathing.       Knee/Hip Exercises: Stretches   Passive Hamstring Stretch  Right;Left;2 reps;20 seconds   seated edge of chair   Gastroc Stretch  Right;Left;2 reps;20 seconds   runners lunger at [employer] Stretch Limitations  stretches done at end of session      Knee/Hip Exercises: Aerobic   Recumbent Bike  Level 3x10 mins: at 2 mins HR 107 bpm, SpO2 97%, then at 6 mins HR 115 bpm, SpO2 97%; 8 mins HR 120 bpm, SpO2 94%, and at 10 mins HR 112 bpm, SpO2 95%      Knee/Hip Exercises: Standing   Forward Lunges  Right;Left;1 set;10 reps   in treadmill              PT Short Term Goals - 04/01/19 1128      PT SHORT TERM GOAL #1   Title  Pt will be independent with self MLD and HEP within 2 weeks in order to demonstrate autonomy of care.    Baseline  Pt has just received a vasopneumatic pump he states that he plays table tennis and does some exercises at home.    Time  4    Period  Weeks    Status  On-going    Target Date  05/06/19        PT Long Term Goals - 04/18/19 1209      PT LONG TERM GOAL #1    Title  Patient will have reduction of L distal thigh girth by 3 cm to demonstrate decreased fluid build up in the L lower extremity.    Baseline  goal met see measurements    Status  Achieved      PT LONG TERM GOAL #2   Title  Patient to be properly fitted with compression garment to wear on daily basis.    Baseline  Pt has a lower extremity garment and compression shorts he just received.    Status  Achieved      PT LONG TERM GOAL #3   Title  Patient will be independent in prevention/self-care management principles including self-massage and long term management plan for edema.    Baseline  Pt is aware of how to manage his lymphedema, he just got some equipment that he needs to help decrease fluid that he needs to learn to use; pt now has his compression pump that he is using daily and wears his compression stocking-04/18/19    Status  Achieved      PT LONG TERM GOAL #5   Title  Pt will improve LLIS score to 10% or less within 4 weeks to demonstrate impove quality of life with lymphedema    Status  On-going      PT LONG TERM GOAL #6   Title  Pt will rate himself riding for 6 minutes on the bike at level 4 at 5-6 RPE within 4 weeks to demonstrate improved endurance.    Baseline  RPE 8 at level 4 for 6 minutes; today did level 3 x10 mins and rated 3-4 after-04/18/19  Status  On-going            Plan - 04/18/19 1211    Clinical Impression Statement  Pt was reporting not feeling compression pump gives him adequate compression between ASIS so briefly instructed him how to perform self MLD here after using pump, he was able to verbalize understanding and return demo. Pt also reports has a new appt with a cardiologist next week. Continued to monitor his HR during exercise today and it ranged from 111-120 during activity. Pt reports RPE no higher than 3-4 with all activity except slightly higher after 30 seconds of sit-stands. Overall pt able to tolerate high level balance well with minimal UE  support as well as did not require seated rest breaks during session. He was also able to perform 14 sit-stands in 30 sec. Encouraged pt to try to start using his gym again at [facility] as he repotrs feeling depressed since all of cancer treatment as he feels his endurance just isn't as good as it was before, thoug it is improving. Also tried to encourage him to get back into table tennis as he truly loves this and that could help him better cope with his low enduarnce and feeling depressed. Pt verbalizes understanding.    Personal Factors and Comorbidities  Comorbidity 2    Comorbidities  Hx prostate cancer and R lower leg sarcoma    Stability/Clinical Decision Making  Stable/Uncomplicated    Rehab Potential  Good    PT Frequency  2x / week    PT Duration  4 weeks    PT Treatment/Interventions  Functional mobility training;Therapeutic activities;Therapeutic exercise;Neuromuscular re-education;Patient/family education;Manual techniques    PT Next Visit Plan  Continue progressing stregth/balance exercises for the LE, progress endurance exercises, measure circumference    Consulted and Agree with Plan of Care  Patient       Patient will benefit from skilled therapeutic intervention in order to improve the following deficits and impairments:  Decreased endurance, Increased edema  Visit Diagnosis: Lymphedema, not elsewhere classified  Other abnormalities of gait and mobility     Problem List Patient Active Problem List   Diagnosis Date Noted  . Major depressive disorder with single episode, in partial remission ([place]) 05/04/2018  . Atherosclerosis of aorta ([place]) 05/04/2018  . Thoracic aortic aneurysm without rupture ([place]) 05/04/2018  . Urethral stricture in male 12/20/2017  . Pulmonary nodules/lesions, multiple 12/20/2017  . Atherosclerosis of renal artery ([place]) 07/05/2017  . Chronic kidney disease, stage 3 03/21/2017  . Complication of skin graft 03/16/2017  . Squamous cell carcinoma,  leg, right 01/25/2017  . Large vessel vasculitis ([place]) 06/10/2015  . H/O drug therapy 12/31/2013  . Hyperlipidemia 11/04/2013  . Delayed effect of radiation 05/06/2013  . Depression 04/01/2013  . Male sexual dysfunction 01/20/2013  . Fatigue 10/22/2012  . Hypothyroidism 07/23/2012  . History of prostate cancer   . Essential hypertension   . Tonsillar cancer ([place])   . Binocular vision disorder with diplopia 07/10/2012  . PLMD (periodic limb movement disorder) 09/13/2011  . OSA (obstructive sleep apnea) 09/13/2011  . Actinic keratosis 01/28/2011  . Vitamin B 12 deficiency 01/28/2011  . Spondylosis 08/02/2010  . Insomnia 08/02/2010    Tomma Rakers, PT 04/18/19 3:21 PM   Otelia Limes, PTA 04/18/2019, 12:19 PM  [place], [place], [address] Phone: [phone]   Fax:  [phone]  Name: Philip [name] MRN: [id number] Date of Birth: [birth year]

## 2019-04-25 ENCOUNTER — Other Ambulatory Visit: Payer: Self-pay

## 2019-04-25 ENCOUNTER — Ambulatory Visit: Payer: Medicare Other

## 2019-04-25 DIAGNOSIS — R2689 Other abnormalities of gait and mobility: Secondary | ICD-10-CM

## 2019-04-25 DIAGNOSIS — I890 Lymphedema, not elsewhere classified: Secondary | ICD-10-CM | POA: Diagnosis not present

## 2019-04-25 NOTE — Therapy (Signed)
[place], [place], [address] Phone: [phone]   Fax:  [phone]  Physical Therapy Treatment  Patient Details  Name: Philip [name] MRN: [id number] Date of Birth: 13-Mar-1932 Referring Provider (PT): Hollace Kinnier DO   Encounter Date: 04/25/2019  PT End of Session - 04/25/19 1001    Visit Number  21    Number of Visits  22    Date for PT Re-Evaluation  05/06/19    PT Start Time  1000    PT Stop Time  1058    PT Time Calculation (min)  58 min    Activity Tolerance  Patient tolerated treatment well    Behavior During Therapy  [facility] for tasks assessed/performed       Past Medical History:  Diagnosis Date  . Aneurysm of left renal artery (HCC)    per CT 08-20-2015 stable 48m  . Aortitis syndrome ([place]-  dr tCharlestine Night  IgG4 syndrome-- treatment prednisone- (effects abdomine)  . Basal cell carcinoma   . Bladder neck contracture   . BPH (benign prostatic hyperplasia)   . CKD (chronic kidney disease), stage III   . Complication of anesthesia    post op acute urinary retention  . DDD (degenerative disc disease)   . Foley catheter in place    placed 02-29-2019 secondary to urinary retention post surgery  . Full dentures   . GERD (gastroesophageal reflux disease)   . History of external beam radiation therapy 2002   prostate cancer  and boost with radioative prostate seed implants  . History of prostate cancer DX  2002   S/P EXTERNAL RADIATION/ RADIOACTIVE SEED IMPLANTS  2003  . History of squamous cell carcinoma excision    2012--  RIGHT LOWER EXTREM;  2019 LEFT LOWER EXTREMITIY  . HTN (hypertension)   . Hyperlipemia   . Hypothyroidism    endocrinologist-  dr bChalmers Cater-- per pt takes his own thyroid med. called NP Thyroid 10 mg daily, does not take synthroid  . Large vessel vasculitis (HCC)    aortitis--- rheumotologist-  dr tCharlestine Night . Left renal artery stenosis (HCC)    proximal per  CT 08-20-2015 but patent  . Left renal atrophy   . Macular degeneration   . Nocturia   . OSA on CPAP    [facility]-- moderate to severe OSA , AHI 16.4/h/  03-24-2017 per pt is consistant using every night  . Pulmonary nodule    [place]  LUL nodule , not mets  . Renal cyst, right   . Restless legs syndrome (RLS) 10/31/2011  . S/P radiation therapy     [facility]  . Saliva decreased   . Sarcoma of [place], right [place]   dx 02-14-2017 w/ needle core bx;  high grade pleomorphic spindle cell sarcoma , grade 2 (cT2N0M0); 03-15-2017  radical resection sarcoma tumor right lower leg  and plan radiation  . Self-catheterizes urinary bladder    QID   AND   PRN  . Tonsillar cancer ([place] 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+) (Stage IVA)---- dx oct 2013  ----s/p concurent chemo and radiation/  ended 01-26-2012-- no surgical intervention---  residuals ( dry mouth, decreased saliva)   [place]--  HX OF --  NO RECURRENCE  . Urethral stricture    chronic---  post urethral dilation's  . Urinary retention with incomplete bladder emptying   . Vitamin B 12 deficiency 01/28/2011  . Vitamin D deficiency     Past Surgical History:  Procedure Laterality Date  . BALLOON DILATION N/A 11/10/2014   Procedure: CYSTO BALLOON DILATION AND RETROGRADE URETHROGRAM ;  Surgeon: Bjorn Loser, MD;  Location[facility];  Service: Urology;  Laterality: N/A;  . CATARACT EXTRACTION W/ INTRAOCULAR LENS  IMPLANT, BILATERAL    . CYSTO/ BALLOON DILATION OF  URETHRAL STRICTURE  12-25-2010  . CYSTOSCOPY WITH RETROGRADE URETHROGRAM N/A 03/30/2016   Procedure: CYSTOSCOPY WITH RETROGRADE URETHROGRAM AND BALLOON DILATION with cystogram;  Surgeon: Bjorn Loser, MD;   Location[facility];  Service: Urology;  Laterality: N/A;  . CYSTOSCOPY WITH URETHRAL DILATATION  05/31/2011   Procedure: CYSTOSCOPY WITH URETHRAL DILATATION;  Surgeon: Reece Packer, MD;  Location: [place];  Service: Urology;  Laterality: N/A;  BALLOON DILATION  . CYSTOSCOPY WITH URETHRAL DILATATION N/A 09/13/2016   Procedure: CYSTOSCOPY WITH URETHRAL BALLOON DILATATION;  Surgeon: Bjorn Loser, MD;  Location: [place];  Service: Urology;  Laterality: N/A;  . CYSTOSCOPY WITH URETHRAL DILATATION N/A 03/30/2017   Procedure: CYSTOSCOPY WITH BALLOON URETHRAL DILATATION;  Surgeon: Bjorn Loser, MD;  Location: [place];  Service: Urology;  Laterality: N/A;  . CYSTOSCOPY/RETROGRADE/URETEROSCOPY N/A 05/22/2012   Procedure: CYSTOSCOPY BALLOON DILATION RETROGRADE URETEROGRAM ;  Surgeon: Reece Packer, MD;  Location: [place];  Service: Urology;  Laterality: N/A;  . CYTSO/  DILATATION URETHRAL STRICTURE/  BX PROSTATIC URETHRA/  REMOVAL FOREIGN BODIES  06-25-2010   [facility]  . INGUINAL HERNIA REPAIR Right 2000  . MOHS SURGERY  01/ 2019    [facility]   left lower leg for SCC  . RADICAL RESECTION OF SARCOMA TUMOR   03-15-2017   [facility]   RIGHT LOWER LEG , CALF AREA  . RADIOACTIVE SEED IMPLANTS, PROSTATE  JAN  2003  . SKIN LESION EXCISION  07/2012   MOST  right shoulder    There were no vitals filed for this visit.  Subjective Assessment - 04/25/19 1001    Subjective  Pt states that he has an appointment with the cardiologist tomorrow. He states that he took his second B12 shot but he continues with fatigue. Pt states he has been using his pump and compression every day.    Pertinent History  Hx Prostate cancer and R lower leg sarcoma    How long can you walk comfortably?  Pt states that his legs get achy and he feels winded after walking about 40 minutes    Diagnostic tests  MRI, CT scan and doppler without  significant findings.    Patient Stated Goals  I want to get this swelling down and work on my endurance.    Currently in Pain?  No/denies    Multiple Pain Sites  No            LYMPHEDEMA/ONCOLOGY QUESTIONNAIRE - 04/25/19 1024      Left Lower Extremity Lymphedema   30 cm Proximal to Suprapatella  52 cm    20 cm Proximal to Suprapatella  47.5 cm    10 cm Proximal to Suprapatella  41.9 cm    At Midpatella/Popliteal Crease  35.4 cm    30 cm Proximal to Floor at Lateral Plantar Foot  33 cm    20 cm Proximal to  Floor at Lateral Plantar Foot  24.4 cm    10 cm Proximal to Floor at Lateral Malleoli  25 cm    5 cm Proximal to 1st MTP Joint  22.5 cm    Around Proximal Great Toe  8.8 cm           [facility]patient [facility]  Lymphedema Life Impact Scale Total Score  10.29 %           [facility] Adult PT Treatment/Exercise - 04/25/19 0001      Neuro Re-ed    Neuro Re-ed Details   On airex at raised plinth table: Hip extension 15x bil, tandem stance on airex SBA intermittent proprioceptive input from PT, SLS 30 seconds bil on airex with intermittent UE use and proprioceptive input from PT with intermittent foot support with R >L, airex on 4" step 10x bil with CGA and no UE support, exaggerated stepping 2 laps in the hall due to pt reporting he doesn't feel like his knee is bending apporpriately HR 91, SPo2 96% at end of session.       Knee/Hip Exercises: Aerobic   Recumbent Bike  Level 3x 10 min HR 98, SPo2 94% at 2 min, HR 102, SPO2 95% at 4 min, HR 103 SPO2 95% at 6 min, HR 102 SPO2 95% at 8 min       Knee/Hip Exercises: Seated   Other Seated Knee/Hip Exercises  seated hamstring stretch 1x 30 seconds Bil short demonstration for correct form.              PT Education - 04/25/19 1058    Education Details  Pt will continue wearing all of his compression and using pump at home. He will continue with his current HEP     Person(s) Educated  Patient    Methods  Explanation    Comprehension  Verbalized understanding       PT Short Term Goals - 04/01/19 1128      PT SHORT TERM GOAL #1   Title  Pt will be independent with self MLD and HEP within 2 weeks in order to demonstrate autonomy of care.    Baseline  Pt has just received a vasopneumatic pump he states that he plays table tennis and does some exercises at home.    Time  4    Period  Weeks    Status  On-going    Target Date  05/06/19        PT Long Term Goals - 04/18/19 1209      PT LONG TERM GOAL #1   Title  Patient will have reduction of L distal thigh girth by 3 cm to demonstrate decreased fluid build up in the L lower extremity.    Baseline  goal met see measurements    Status  Achieved      PT LONG TERM GOAL #2   Title  Patient to be properly fitted with compression garment to wear on daily basis.    Baseline  Pt has a lower extremity garment and compression shorts he just received.    Status  Achieved      PT LONG TERM GOAL #3   Title  Patient will be independent in prevention/self-care management principles including self-massage and long term management plan for edema.    Baseline  Pt is aware of how to manage his lymphedema, he just got some equipment that he needs to help decrease fluid that he needs to  learn to use; pt now has his compression pump that he is using daily and wears his compression stocking-04/18/19    Status  Achieved      PT LONG TERM GOAL #5   Title  Pt will improve LLIS score to 10% or less within 4 weeks to demonstrate impove quality of life with lymphedema    Status  On-going      PT LONG TERM GOAL #6   Title  Pt will rate himself riding for 6 minutes on the bike at level 4 at 5-6 RPE within 4 weeks to demonstrate improved endurance.    Baseline  RPE 8 at level 4 for 6 minutes; today did level 3 x10 mins and rated 3-4 after-04/18/19    Status  On-going            Plan - 04/25/19 0959    Clinical  Impression Statement  Pt states he does not feel like his compression shorts are giving him enough compression; discussed that the "flabby" skin he sees is becuase of decreased fluid in this area. Overall pt circumferential measurements have decreased this session and he was encouraged to continue with the compression and vasopneumatic pump at home. Pt HR has improved with exercise an is no longer increasing significantly with relatively low level activities; he was able to tolerate high level balance activities with CGA/SBA. Pt continues with poor safety awareness and states he has "good reflexes" and can catch himself. Pt will benefit from continued POC at this time.    Personal Factors and Comorbidities  Comorbidity 2    Comorbidities  Hx prostate cancer and R lower leg sarcoma    Rehab Potential  Good    PT Frequency  2x / week    PT Duration  4 weeks    PT Treatment/Interventions  Functional mobility training;Therapeutic activities;Therapeutic exercise;Neuromuscular re-education;Patient/family education;Manual techniques    PT Next Visit Plan  Continue progressing stregth/balance exercises for the LE, progress endurance exercises, measure circumference    PT Home Exercise Plan  Access Code: [email or web address]    Consulted and Agree with Plan of Care  Patient       Patient will benefit from skilled therapeutic intervention in order to improve the following deficits and impairments:  Decreased endurance, Increased edema  Visit Diagnosis: Lymphedema, not elsewhere classified  Other abnormalities of gait and mobility     Problem List Patient Active Problem List   Diagnosis Date Noted  . Major depressive disorder with single episode, in partial remission ([place]) 05/04/2018  . Atherosclerosis of aorta ([place]) 05/04/2018  . Thoracic aortic aneurysm without rupture ([place]) 05/04/2018  . Urethral stricture in male 12/20/2017  . Pulmonary nodules/lesions, multiple 12/20/2017  . Atherosclerosis of renal  artery ([place]) 07/05/2017  . Chronic kidney disease, stage 3 03/21/2017  . Complication of skin graft 03/16/2017  . Squamous cell carcinoma, leg, right 01/25/2017  . Large vessel vasculitis ([place]) 06/10/2015  . H/O drug therapy 12/31/2013  . Hyperlipidemia 11/04/2013  . Delayed effect of radiation 05/06/2013  . Depression 04/01/2013  . Male sexual dysfunction 01/20/2013  . Fatigue 10/22/2012  . Hypothyroidism 07/23/2012  . History of prostate cancer   . Essential hypertension   . Tonsillar cancer ([place])   . Binocular vision disorder with diplopia 07/10/2012  . PLMD (periodic limb movement disorder) 09/13/2011  . OSA (obstructive sleep apnea) 09/13/2011  . Actinic keratosis 01/28/2011  . Vitamin B 12 deficiency 01/28/2011  . Spondylosis 08/02/2010  . Insomnia 08/02/2010  Ander Purpura, PT 04/25/2019, 11:04 AM  [place], [place], [address] Phone: [phone]   Fax:  [phone]  Name: Philip [name] MRN: [id number] Date of Birth: 1932-08-08

## 2019-04-26 ENCOUNTER — Encounter: Payer: Self-pay | Admitting: Cardiovascular Disease

## 2019-04-26 ENCOUNTER — Ambulatory Visit (INDEPENDENT_AMBULATORY_CARE_PROVIDER_SITE_OTHER): Payer: Medicare Other | Admitting: Cardiovascular Disease

## 2019-04-26 VITALS — BP 176/60 | HR 64 | Ht 71.0 in | Wt 161.0 lb

## 2019-04-26 DIAGNOSIS — I493 Ventricular premature depolarization: Secondary | ICD-10-CM | POA: Diagnosis not present

## 2019-04-26 DIAGNOSIS — R5382 Chronic fatigue, unspecified: Secondary | ICD-10-CM

## 2019-04-26 DIAGNOSIS — M316 Other giant cell arteritis: Secondary | ICD-10-CM

## 2019-04-26 DIAGNOSIS — R0602 Shortness of breath: Secondary | ICD-10-CM | POA: Diagnosis not present

## 2019-04-26 NOTE — Patient Instructions (Signed)
Medication Instructions:  The current medical regimen is effective;  continue present plan and medications.  *If you need a refill on your cardiac medications before your next appointment, please call your pharmacy*   Lab Work: BNP today  If you have labs (blood work) drawn today and your tests are completely normal, you will receive your results only by: . MyChart Message (if you have MyChart) OR . A paper copy in the mail If you have any lab test that is abnormal or we need to change your treatment, we will call you to review the results.   Testing/Procedures: Echocardiogram - Your physician has requested that you have an echocardiogram. Echocardiography is a painless test that uses sound waves to create images of your heart. It provides your doctor with information about the size and shape of your heart and how well your heart's chambers and valves are working. This procedure takes approximately one hour. There are no restrictions for this procedure. This will be performed at our [address] location - [address], [address].    Follow-Up: At [facility], you and your health needs are our priority.  As part of our continuing mission to provide you with exceptional heart care, we have created designated Provider Care Teams.  These Care Teams include your primary Cardiologist (physician) and Advanced Practice Providers (APPs -  Physician Assistants and Nurse Practitioners) who all work together to provide you with the care you need, when you need it.  We recommend signing up for the patient portal called "MyChart".  Sign up information is provided on this After Visit Summary.  MyChart is used to connect with patients for Virtual Visits (Telemedicine).  Patients are able to view lab/test results, encounter notes, upcoming appointments, etc.  Non-urgent messages can be sent to your provider as well.   To learn more about what you can do with MyChart, go to [email or web address].     Your next appointment:   1 month(s)  The format for your next appointment:   In Person  Provider:   Tullos O'Neal, MD    

## 2019-04-26 NOTE — Progress Notes (Signed)
1 Cardiology Office Note:   Date:  04/26/2019  NAME:  Philip [name]    MRN: [id number] DOB:  Aug 19, 1932   PCP:  Gayland Curry, DO  Cardiologist:  No primary care provider on file.   Referring MD: Gayland Curry, DO   Chief Complaint  Patient presents with  . Shortness of Breath   History of Present Illness:   Philip [name] is a 86 y.o. male with a hx of IGG4 vasculitis, OSA, cancer who is being seen today for the evaluation of shortness of breath at the request of Reed, Tiffany L, DO.  He has a long standing history of shortness of breath.  He reports for several years he has no energy and fatigue.  He is able to walk 1.5 miles per day about 3 times per week but reports that this has become more burdensome to do.  He used to play competitive table tennis for 3-hour sessions but is unable to continue to do this.  This is also coincided with a diagnosis of hypertension.  His blood pressure today is 176/60 and he did not take his medication.  He is recently been started on lisinopril by his primary care physician.  Other things in his medical history are sleep apnea but he noticed no benefit from the sleep mask.  He reports back in 2010 he was diagnosed with OSA and encouraged to wear a CPAP at night.  He reports he noticed no benefit and just stopped.  He reports he has trouble sleeping but he does not think it is sleep apnea.  He denies any chest pain or palpitations with exercise.  There is no prior history of heart attack or stroke per his report.  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  He did 13 years in [employer] in the 18s and 18s.  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  He has trouble with prostate cancer as well as a right lower extremity sarcoma that required surgery and radiation.  He is in remission for this.  He also has been treated for IgG4 vasculitis and aorta on chronic prednisone therapy.  His EKG today  demonstrates normal sinus rhythm with LVH and a single PVC.  He is a never smoker but was exposed to secondhand smoke in the past.  He rarely consumes alcohol and no illicit drug use.  No strong family history of heart disease.  He has never had a heart attack or stroke.  Problem List 1. 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 Prostate CA 3. 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 Lymphedema  5. OSA  Past Medical History: Past Medical History:  Diagnosis Date  . Aneurysm of left renal artery (HCC)    per CT 08-20-2015 stable 4mm  . Aortitis syndrome ([place]) rheumotologist-  dr Charlestine Night   IgG4 syndrome-- treatment prednisone- (effects abdomine)  . Basal cell carcinoma   . Bladder neck contracture   . BPH (benign prostatic hyperplasia)   . CKD (chronic kidney disease), stage III   . Complication of anesthesia    post op acute urinary retention  . DDD (degenerative disc disease)   . Foley catheter in place    placed 02-29-2019 secondary to urinary retention post surgery  . Full dentures   . GERD (gastroesophageal reflux disease)   . History of external  beam radiation therapy 2002   prostate cancer  and boost with radioative prostate seed implants  . History of prostate cancer DX  2002   S/P EXTERNAL RADIATION/ RADIOACTIVE SEED IMPLANTS  2003  . History of squamous cell carcinoma excision    2012--  RIGHT LOWER EXTREM;  2019 LEFT LOWER EXTREMITIY  . HTN (hypertension)   . Hyperlipemia   . Hypothyroidism    endocrinologist-  dr Chalmers Cater--- per pt takes his own thyroid med. called NP Thyroid 10 mg daily, does not take synthroid  . Large vessel vasculitis (HCC)    aortitis--- rheumotologist-  dr Charlestine Night  . Left renal artery stenosis (HCC)    proximal per CT 08-20-2015 but patent  . Left renal atrophy   . Macular degeneration   . Nocturia   . OSA on CPAP    pt retested 11-13-2016 at [facility] w/ dr ather--  moderate to severe OSA , AHI 16.4/h/  03-24-2017 per pt is consistant using every night  . Pulmonary nodule    per CT 02-28-2017 in [place] in epic done at [place],  LUL nodule , not mets  . Renal cyst, right   . Restless legs syndrome (RLS) 10/31/2011  . S/P radiation therapy     for tonsillar cancer (head and neck) completed 01-26-2012 at [facility])  . Saliva decreased   . Sarcoma of lower extremity, right ([place]) oncolgoist-  dr Juluis Rainier ([place])   dx 02-14-2017 w/ needle core bx;  high grade pleomorphic spindle cell sarcoma , grade 2 (cT2N0M0); 03-15-2017  radical resection sarcoma tumor right lower leg  and plan radiation  . Self-catheterizes urinary bladder    QID   AND   PRN  . 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 Urethral stricture    chronic---  post urethral dilation's  . Urinary retention with incomplete bladder emptying   . Vitamin B 12 deficiency 01/28/2011  . Vitamin D deficiency     Past Surgical History: Past Surgical History:  Procedure Laterality Date  . BALLOON DILATION N/A 11/10/2014   Procedure: CYSTO BALLOON DILATION AND RETROGRADE URETHROGRAM ;  Surgeon: Bjorn Loser, MD;  Location: [facility];  Service: Urology;  Laterality: N/A;  . CATARACT EXTRACTION W/ INTRAOCULAR LENS  IMPLANT, BILATERAL    . CYSTO/ BALLOON DILATION OF  URETHRAL STRICTURE  12-25-2010  . CYSTOSCOPY WITH RETROGRADE URETHROGRAM N/A 03/30/2016   Procedure: CYSTOSCOPY WITH RETROGRADE URETHROGRAM AND BALLOON DILATION with cystogram;  Surgeon: Bjorn Loser, MD;  Location[facility];  Service: Urology;  Laterality: N/A;  . CYSTOSCOPY WITH URETHRAL DILATATION  05/31/2011   Procedure:  CYSTOSCOPY WITH URETHRAL DILATATION;  Surgeon: Reece Packer, MD;  Location: [place];  Service: Urology;  Laterality: N/A;  BALLOON DILATION  . CYSTOSCOPY WITH URETHRAL DILATATION N/A 09/13/2016   Procedure: CYSTOSCOPY WITH URETHRAL BALLOON DILATATION;  Surgeon: Bjorn Loser, MD;  Location: [place];  Service: Urology;  Laterality: N/A;  . CYSTOSCOPY WITH URETHRAL DILATATION N/A 03/30/2017   Procedure: CYSTOSCOPY WITH BALLOON URETHRAL DILATATION;  Surgeon: Bjorn Loser, MD;  Location: [place];  Service: Urology;  Laterality: N/A;  . CYSTOSCOPY/RETROGRADE/URETEROSCOPY N/A 05/22/2012   Procedure: CYSTOSCOPY  BALLOON DILATION RETROGRADE URETEROGRAM ;  Surgeon: Reece Packer, MD;  Location: [facility];  Service: Urology;  Laterality: N/A;  . CYTSO/  DILATATION URETHRAL STRICTURE/  BX PROSTATIC URETHRA/  REMOVAL FOREIGN BODIES  06-25-2010   [facility]  . INGUINAL HERNIA REPAIR Right 2000  . MOHS SURGERY  01/ 2019    [facility]   left lower leg for SCC  . RADICAL RESECTION OF SARCOMA TUMOR   03-15-2017   [facility]   RIGHT LOWER LEG , CALF AREA  . RADIOACTIVE SEED IMPLANTS, PROSTATE  JAN  2003  . SKIN LESION EXCISION  07/2012   MOST  right shoulder    Current Medications: Current Meds  Medication Sig  . buPROPion (WELLBUTRIN) 100 MG tablet Take 1 tablet by mouth twice daily  . cholecalciferol (VITAMIN D) 1000 units tablet Take 2,000 Units by mouth daily.  . Coenzyme Q10 (COQ10) 100 MG CAPS Take 100 mg by mouth daily. Take one daily  . cyanocobalamin (,VITAMIN B-12,) 1000 MCG/ML injection Inject 1 mL (1,000 mcg total) into the muscle every 30 (thirty) days for 3 doses.  . Cyanocobalamin-Methylcobalamin 600-600 MCG SUBL Place under the tongue daily.  Marland Kitchen DIGESTIVE ENZYMES PO Take 500 mg by mouth every morning.   . fish oil-omega-3 fatty acids 1000 MG capsule Take 2 g by mouth daily. Name:  Natural Factors Omega-3  . lisinopril  (ZESTRIL) 10 MG tablet Take 1 tablet by mouth once daily  . Multiple Vitamins-Minerals (ICAPS AREDS 2 PO) Take 2 tablets by mouth daily.  . NP THYROID 90 MG tablet Take 90 mg by mouth daily.  . predniSONE (DELTASONE) 5 MG tablet Take 5 mg by mouth daily with breakfast.  . RESVERATROL PO Take 100 mg by mouth daily.   . TURMERIC CURCUMIN PO Take 2 tablets by mouth daily.   Marland Kitchen UNABLE TO FIND every other day. Mercie Eon take once daily - Vitamin E  . UNABLE TO FIND [place] take once daily  . UNABLE TO FIND [place] take 4 tablets once daily  . UNABLE TO FIND Comprehensive immune support take 2 tablets once daily  . UNABLE TO FIND Modified citrus pectin take 4 tablets once daily  . UNABLE TO FIND Celery seed take 2 tablets by mouth once daily     Allergies:    Cefixime, Contrast media [iodinated diagnostic agents], Macrobid [nitrofurantoin macrocrystal], Nitrofurantoin, Nsaids, and Cephalosporins   Social History: Social History   Socioeconomic History  . Marital status: Married    Spouse name: Not on file  . Number of children: 4  . Years of education: college  . Highest education level: Not on file  Occupational History  . Occupation: retired  Tobacco Use  . Smoking status: Never Smoker  . Smokeless tobacco: Never Used  Substance and Sexual Activity  . Alcohol use: Yes    Alcohol/week: 7.0 standard drinks    Types: 7 Glasses of wine per week    Comment: 8oz, glass or red wine  . Drug use: No  . Sexual activity: Not on file  Other Topics Concern  . 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    Social Determinants of Health   Financial Resource Strain:   . Difficulty of Paying Living Expenses:   Food Insecurity:   . Worried About Charity fundraiser in the Last  Year:   . Ran Out of Food in the Last Year:   Transportation Needs:   . Lexicographer (Medical):   Marland Kitchen Lack of Transportation (Non-Medical):   Physical Activity:   . Days of Exercise per Week:   . Minutes of Exercise per Session:   Stress:   . Feeling of Stress :   Social Connections:   . Frequency of Communication with Friends and Family:   . Frequency of Social Gatherings with Friends and Family:   . Attends Religious Services:   . Active Member of Clubs or Organizations:   . Attends Archivist Meetings:   Marland Kitchen Marital Status:      Family History: The patient's family history includes Stroke in his father.  ROS:   All other ROS reviewed and negative. Pertinent positives noted in the HPI.     EKGs/Labs/Other Studies Reviewed:   The following studies were personally reviewed by me today:  EKG:  EKG is ordered today.  The ekg ordered today demonstrates normal sinus rhythm, first-degree AV block, 210 ms, single PVC noted, LVH by voltage, and was personally reviewed by me.   Recent Labs: 01/31/2019: BUN 25; Creatinine 1.5; Hemoglobin 14.0; Platelets 233; Potassium 4.7; Sodium 141; TSH 4.21   Recent Lipid Panel    Component Value Date/Time   CHOL 253 (A) 05/03/2018 0000   TRIG 81 05/03/2018 0000   HDL 79 (A) 05/03/2018 0000   LDLCALC 157 05/03/2018 0000    Physical Exam:   VS:  BP (!) 176/60   Pulse 64   Ht 5\' 11"  (1.803 m)   Wt 161 lb (73 kg)   SpO2 95%   BMI 22.45 kg/m    Wt Readings from Last 3 Encounters:  04/26/19 161 lb (73 kg)  03/20/19 164 lb (74.4 kg)  01/30/19 164 lb (74.4 kg)    General: Well nourished, well developed, in no acute distress Heart: Atraumatic, normal size  Eyes: PEERLA, EOMI  Neck: Supple, no JVD Endocrine: No thryomegaly Cardiac: Normal S1, S2; RRR; no murmurs, extra beats noted, likely PVCs Lungs: Clear to auscultation bilaterally, no wheezing, rhonchi or rales  Abd: Soft, nontender, no hepatomegaly  Ext: No edema, pulses 2+ Musculoskeletal: No deformities, BUE and BLE strength normal and  equal Skin: Warm and dry, no rashes   Neuro: Alert and oriented to person, place, time, and situation, CNII-XII grossly intact, no focal deficits  Psych: Normal mood and affect   ASSESSMENT:   Philip [name] is a 86 y.o. male who presents for the following: 1. SOB (shortness of breath) on exertion   2. Chronic fatigue   3. Large vessel vasculitis ([place])   4. PVC (premature ventricular contraction)     PLAN:   1. SOB (shortness of breath) on exertion 2. Chronic fatigue 3. PVC -Unclear etiology.  No real symptoms of angina.  EKG does show a single PVC.  When I listen to him he has no murmurs rubs or gallops but does have extra and irregular beats noted which are likely PVCs.  He has no evidence of volume overload or congestive heart failure my examination.  We need more information. -We will check a BNP, echocardiogram and 3-day Zio patch.  We need to exclude congestive heart failure with a BNP and echocardiogram.  I think it would be good to quantify his PVCs because these could make him feel poorly.  We will do this with a 3-day Zio patch.  Lungs sound clear and  I see no need for chest x-ray at this time. -He may need a stress test depending on time.  We will hold this for now.  He describes no chest pain or pressure to suggest angina.  4. Large vessel vasculitis ([place]) -He will continue prednisone for this.  Well-controlled per reports.  Disposition: Return in about 1 month (around 05/26/2019).  Medication Adjustments/Labs and Tests Ordered: Current medicines are reviewed at length with the patient today.  Concerns regarding medicines are outlined above.  Orders Placed This Encounter  Procedures  . Brain natriuretic peptide  . EKG 12-Lead  . ECHOCARDIOGRAM COMPLETE   No orders of the defined types were placed in this encounter.   Patient Instructions  Medication Instructions:  The current medical regimen is effective;  continue present plan and medications.  *If you need a  refill on your cardiac medications before your next appointment, please call your pharmacy*   Lab Work: BNP today  If you have labs (blood work) drawn today and your tests are completely normal, you will receive your results only by: Marland Kitchen MyChart Message (if you have MyChart) OR . A paper copy in the mail If you have any lab test that is abnormal or we need to change your treatment, we will call you to review the results.   Testing/Procedures: Echocardiogram - Your physician has requested that you have an echocardiogram. Echocardiography is a painless test that uses sound waves to create images of your heart. It provides your doctor with information about the size and shape of your heart and how well your heart's chambers and valves are working. This procedure takes approximately one hour. There are no restrictions for this procedure. This will be performed at our [facility] location - [address].    Follow-Up: At [facility], you and your health needs are our priority.  As part of our continuing mission to provide you with exceptional heart care, we have created designated Provider Care Teams.  These Care Teams include your primary Cardiologist (physician) and Advanced Practice Providers (APPs -  Physician Assistants and Nurse Practitioners) who all work together to provide you with the care you need, when you need it.  We recommend signing up for the patient portal called "MyChart".  Sign up information is provided on this After Visit Summary.  MyChart is used to connect with patients for Virtual Visits (Telemedicine).  Patients are able to view lab/test results, encounter notes, upcoming appointments, etc.  Non-urgent messages can be sent to your provider as well.   To learn more about what you can do with MyChart, go to [email or web address].    Your next appointment:   1 month(s)  The format for your next appointment:   In Person  Provider:   Eleonore Chiquito,  MD        Signed, Addison Naegeli. Audie Box, [place]  [address], [place], [place] [address] [phone]  04/26/2019 10:30 AM

## 2019-04-30 LAB — BRAIN NATRIURETIC PEPTIDE: BNP: 99.5 pg/mL (ref 0.0–100.0)

## 2019-05-01 ENCOUNTER — Telehealth: Payer: Self-pay | Admitting: Cardiovascular Disease

## 2019-05-01 ENCOUNTER — Other Ambulatory Visit: Payer: Self-pay

## 2019-05-01 DIAGNOSIS — I493 Ventricular premature depolarization: Secondary | ICD-10-CM

## 2019-05-01 NOTE — Telephone Encounter (Signed)
Unaware of the ZIO, I read over notes from MD, seen that he wanted it ordered. I have ordered the ZIO and they will call him to have it mailed out to him.

## 2019-05-01 NOTE — Telephone Encounter (Signed)
Patient states that he was supposed to have received a heart monitor but has not heard anything else about it.

## 2019-05-02 ENCOUNTER — Telehealth: Payer: Self-pay | Admitting: Radiology

## 2019-05-02 ENCOUNTER — Other Ambulatory Visit: Payer: Self-pay

## 2019-05-02 ENCOUNTER — Ambulatory Visit: Payer: Medicare Other

## 2019-05-02 DIAGNOSIS — R2689 Other abnormalities of gait and mobility: Secondary | ICD-10-CM

## 2019-05-02 DIAGNOSIS — I890 Lymphedema, not elsewhere classified: Secondary | ICD-10-CM | POA: Diagnosis not present

## 2019-05-02 NOTE — Telephone Encounter (Signed)
Enrolled patient for a 3 day Zio monitor to be mailed to patients home.  

## 2019-05-02 NOTE — Therapy (Addendum)
[place], [place], [address] Phone: [phone]   Fax:  [phone]  Physical Therapy Discharge Note  Patient Details  Name: Philip [name] MRN: [id number] Date of Birth: 1932/06/11 Referring Provider (PT): Hollace Kinnier DO   Encounter Date: 05/02/2019  PT End of Session - 05/02/19 1112    Visit Number  22    Number of Visits  22    Date for PT Re-Evaluation  05/06/19    PT Start Time  1107    PT Stop Time  1146    PT Time Calculation (min)  39 min    Activity Tolerance  Patient tolerated treatment well    Behavior During Therapy  [facility] for tasks assessed/performed       Past Medical History:  Diagnosis Date  . Aneurysm of left renal artery (HCC)    per CT 08-20-2015 stable 30m  . Aortitis syndrome ([place]ot[place]-  dr tCharlestine Night  IgG4 syndrome-- treatment prednisone- (effects abdomine)  . Basal cell carcinoma   . Bladder neck contracture   . BPH (benign prostatic hyperplasia)   . CKD (chronic kidney disease), stage III   . Complication of anesthesia    post op acute urinary retention  . DDD (degenerative disc disease)   . Foley catheter in place    placed 02-29-2019 secondary to urinary retention post surgery  . Full dentures   . GERD (gastroesophageal reflux disease)   . History of external beam radiation therapy 2002   prostate cancer  and boost with radioative prostate seed implants  . History of prostate cancer DX  2002   S/P EXTERNAL RADIATION/ RADIOACTIVE SEED IMPLANTS  2003  . History of squamous cell carcinoma excision    2012--  RIGHT LOWER EXTREM;  2019 LEFT LOWER EXTREMITIY  . HTN (hypertension)   . Hyperlipemia   . Hypothyroidism    endocrinologist-  dr bChalmers Cater-- per pt takes his own thyroid med. called NP Thyroid 10 mg daily, does not take synthroid  . Large vessel vasculitis (HCC)    aortitis--- rheumotologist-  dr tCharlestine Night . Left renal artery stenosis (HCC)    proximal per CT 08-20-2015 but patent  . Left renal atrophy   . Macular degeneration   . Nocturia   . OSA on CPAP    [facility]-- moderate to severe OSA , AHI 16.4/h/  03-24-2017 per pt is consistant using every night  . Pulmonary nodule    [place] [facility]  LUL nodule , not mets  . Renal cyst, right   . Restless legs syndrome (RLS) 10/31/2011  . S/P radiation therapy     [facility] tonsillar cancer (head and neck) [facility]  . Saliva decreased   . Sarcoma of lower extremity, right ([place]  [place]   dx 02-14-2017 w/ needle core bx;  high grade pleomorphic spindle cell sarcoma , grade 2 (cT2N0M0); 03-15-2017  radical resection sarcoma tumor right lower leg  and plan radiation  . Self-catheterizes urinary bladder    QID   AND   PRN  . Tonsillar cancer ([place] 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+) (Stage IVA)---- dx oct 2013  ----s/p concurent chemo and radiation/  ended 01-26-2012-- no surgical intervention---  residuals ( dry mouth, decreased saliva)   [facility] [place]  [place]--  HX OF --  NO RECURRENCE  . Urethral stricture    chronic---  post urethral dilation's  . Urinary retention with incomplete bladder emptying   . Vitamin B 12 deficiency 01/28/2011  . Vitamin D deficiency     Past Surgical History:  Procedure Laterality Date  . BALLOON DILATION N/A 11/10/2014   Procedure: CYSTO BALLOON DILATION AND RETROGRADE URETHROGRAM ;  Surgeon: Bjorn Loser, MD;  Location: [facility];  Service: Urology;  Laterality: N/A;  . CATARACT EXTRACTION W/ INTRAOCULAR LENS  IMPLANT, BILATERAL    . CYSTO/ BALLOON DILATION OF  URETHRAL STRICTURE  12-25-2010  . CYSTOSCOPY WITH RETROGRADE URETHROGRAM N/A 03/30/2016   Procedure: CYSTOSCOPY WITH RETROGRADE URETHROGRAM AND BALLOON DILATION with cystogram;  Surgeon: Bjorn Loser, MD;  Location: [facility];  Service: Urology;  Laterality: N/A;  . CYSTOSCOPY WITH URETHRAL DILATATION  05/31/2011   Procedure: CYSTOSCOPY WITH URETHRAL DILATATION;  Surgeon: Reece Packer, MD;  Location: [place];  Service: Urology;  Laterality: N/A;  BALLOON DILATION  . CYSTOSCOPY WITH URETHRAL DILATATION N/A 09/13/2016   Procedure: CYSTOSCOPY WITH URETHRAL BALLOON DILATATION;  Surgeon: Bjorn Loser, MD;  Location: [place];  Service: Urology;  Laterality: N/A;  . CYSTOSCOPY WITH URETHRAL DILATATION N/A 03/30/2017   Procedure: CYSTOSCOPY WITH BALLOON URETHRAL DILATATION;  Surgeon: Bjorn Loser, MD;  Location: [place];  Service: Urology;  Laterality: N/A;  . CYSTOSCOPY/RETROGRADE/URETEROSCOPY N/A 05/22/2012   Procedure: CYSTOSCOPY BALLOON DILATION RETROGRADE URETEROGRAM ;  Surgeon: Reece Packer, MD;  Location: [place];  Service: Urology;  Laterality: N/A;  . CYTSO/  DILATATION URETHRAL STRICTURE/  BX PROSTATIC URETHRA/  REMOVAL FOREIGN BODIES  06-25-2010   [facility]  . INGUINAL HERNIA REPAIR Right 2000  . MOHS SURGERY  01/ 2019    [facility]   left lower leg for SCC  . RADICAL RESECTION OF SARCOMA TUMOR   03-15-2017   [facility]   RIGHT LOWER LEG , CALF AREA  . RADIOACTIVE SEED IMPLANTS, PROSTATE  JAN  2003  . SKIN LESION EXCISION  07/2012   MOST  right shoulder    There were no vitals filed for this visit.  Subjective Assessment - 05/02/19 1113    Subjective  Pt states that he feels like he knows how to manage his lymphedema at home using his compression stocking/shorts and his pump at home.    Pertinent History  Hx Prostate cancer and R lower leg sarcoma    How long can you walk comfortably?  Pt states that his legs get achy and he feels winded after walking about 40 minutes    Diagnostic tests  MRI, CT scan and doppler without significant findings.    Patient Stated Goals  I want to get  this swelling down and work on my endurance.    Currently in Pain?  No/denies    Multiple Pain Sites  No            LYMPHEDEMA/ONCOLOGY QUESTIONNAIRE - 05/02/19 1127      Left Lower Extremity Lymphedema   30 cm Proximal to Suprapatella  51 cm    20 cm Proximal to Suprapatella  47.8 cm    10 cm Proximal to Suprapatella  43.8 cm    At Midpatella/Popliteal Crease  35.1 cm    30 cm Proximal to Floor at Lateral Plantar Foot  33.5 cm    20 cm Proximal to Floor at Lateral Plantar Foot  24.2 cm    10 cm  Proximal to Floor at Lateral Malleoli  25 cm    5 cm Proximal to 1st MTP Joint  23 cm    Around Proximal Great Toe  8.8 cm           Outpatient Re[facility]02/[facility]  Lymphedema Life Impact Scale Total Score  7.35 %           [facility] Adult PT Treatment/Exercise - 05/02/19 0001      Knee/Hip Exercises: Aerobic   Recumbent Bike  Level 4x 8 min       Manual Therapy   Manual Therapy  Edema management    Edema Management  circumferential measurements. pt assisted with donning his compression garment and shorts.              PT Education - 05/02/19 1138    Education Details  Pt will continue wearing all of his compression, using the vasopnuematic pump and exercising. He will return to physical therapy if he is unable to manage at home or if he needs to order a new garment. He was supplied in information from [facility] where his first garment was ordered. Pt was educated on flat knit vs. circular knit and discussed options other than compression stocking if pt finds the compression stocking is too difficult to don or seems like it is no longer containing his fluid.    Person(s) Educated  Patient    Methods  Explanation    Comprehension  Verbalized understanding       PT Short Term Goals - 05/02/19 1113      PT SHORT TERM GOAL #1   Title  Pt will be independent with self MLD and HEP within 2 weeks in order to demonstrate  autonomy of care.    Baseline  Pt is independent with pump and exercises at home.    Status  Achieved        PT Long Term Goals - 05/02/19 1114      PT LONG TERM GOAL #3   Title  Patient will be independent in prevention/self-care management principles including self-massage and long term management plan for edema.    Baseline  Pt is able to state how to manage his lymphedema with vasopnumatic pump, exercise and compress. He understand and is able to state when to come back to physical therapy.    Status  Achieved      PT LONG TERM GOAL #4   Title  see below    Baseline  see below      PT LONG TERM GOAL #5   Title  Pt will improve LLIS score to 10% or less within 4 weeks to demonstrate impove quality of life with lymphedema    Baseline  7.35%    Status  Achieved      PT LONG TERM GOAL #6   Title  Pt will rate himself riding for 6 minutes on the bike at level 4 at 5-6 RPE within 4 weeks to demonstrate improved endurance.    Baseline  RPE 2 at level 4 for 8 minutes    Status  Achieved            Plan - 05/02/19 1112    Clinical Impression Statement  Pt goals were checked today and he has met all of his goals. Pt has had a decrease in circumferential measurements and improved endurance since his initial evaluation. He went to the cardiologist last week and is going to get an  EKG done due to some findings. Pt is consistently wearing his compression and using his vasopneumatic pump at home. Discussed the importance of changing his compression garment every 6 months for effectiveness and was provided with a card from [facility]. Pt had questions about his EKG and was referred to ask the cardiologist due to that is outside this physical therapists scope of practice. Pt will be discharged today and is aware of discharge. He will contact physical therapy if he is unable to manage edema independently.    Personal Factors and Comorbidities  Comorbidity 2    Comorbidities  Hx prostate cancer  and R lower leg sarcoma    Rehab Potential  Good    PT Frequency  2x / week    PT Duration  4 weeks    PT Treatment/Interventions  Functional mobility training;Therapeutic activities;Therapeutic exercise;Neuromuscular re-education;Patient/family education;Manual techniques    PT Next Visit Plan  Pt will be D/C'd today    PT Home Exercise Plan  Access Code: [email or web address]    Consulted and Agree with Plan of Care  Patient       Patient will benefit from skilled therapeutic intervention in order to improve the following deficits and impairments:  Decreased endurance, Increased edema  Visit Diagnosis: Lymphedema, not elsewhere classified  Other abnormalities of gait and mobility     Problem List Patient Active Problem List   Diagnosis Date Noted  . Major depressive disorder with single episode, in partial remission ([place]) 05/04/2018  . Atherosclerosis of aorta ([place]) 05/04/2018  . Thoracic aortic aneurysm without rupture ([place]) 05/04/2018  . Urethral stricture in male 12/20/2017  . Pulmonary nodules/lesions, multiple 12/20/2017  . Atherosclerosis of renal artery ([place]) 07/05/2017  . Chronic kidney disease, stage 3 03/21/2017  . Complication of skin graft 03/16/2017  . Squamous cell carcinoma, leg, right 01/25/2017  . Large vessel vasculitis ([place]) 06/10/2015  . H/O drug therapy 12/31/2013  . Hyperlipidemia 11/04/2013  . Delayed effect of radiation 05/06/2013  . Depression 04/01/2013  . Male sexual dysfunction 01/20/2013  . Fatigue 10/22/2012  . Hypothyroidism 07/23/2012  . History of prostate cancer   . Essential hypertension   . Tonsillar cancer ([place])   . Binocular vision disorder with diplopia 07/10/2012  . PLMD (periodic limb movement disorder) 09/13/2011  . OSA (obstructive sleep apnea) 09/13/2011  . Actinic keratosis 01/28/2011  . Vitamin B 12 deficiency 01/28/2011  . Spondylosis 08/02/2010  . Insomnia 08/02/2010    PHYSICAL THERAPY DISCHARGE SUMMARY  Plan: Patient  agrees to discharge.  Patient goals were met. Patient is being discharged due to meeting the stated rehab goals.  ?????       Ander Purpura, PT 05/02/2019, 11:51 AM  [place], [place], [address] Phone: [phone]   Fax:  [phone]  Name: ZEBULIN SIEGEL MRN: [id number] Date of Birth: 1932/06/12

## 2019-05-06 ENCOUNTER — Other Ambulatory Visit: Payer: Self-pay

## 2019-05-06 ENCOUNTER — Ambulatory Visit (HOSPITAL_COMMUNITY): Payer: Medicare Other | Attending: Cardiovascular Disease

## 2019-05-06 DIAGNOSIS — L905 Scar conditions and fibrosis of skin: Secondary | ICD-10-CM | POA: Diagnosis not present

## 2019-05-06 DIAGNOSIS — Z09 Encounter for follow-up examination after completed treatment for conditions other than malignant neoplasm: Secondary | ICD-10-CM | POA: Diagnosis not present

## 2019-05-06 DIAGNOSIS — Z85828 Personal history of other malignant neoplasm of skin: Secondary | ICD-10-CM | POA: Diagnosis not present

## 2019-05-06 DIAGNOSIS — C44229 Squamous cell carcinoma of skin of left ear and external auricular canal: Secondary | ICD-10-CM | POA: Diagnosis not present

## 2019-05-06 DIAGNOSIS — D0462 Carcinoma in situ of skin of left upper limb, including shoulder: Secondary | ICD-10-CM | POA: Diagnosis not present

## 2019-05-06 DIAGNOSIS — D485 Neoplasm of uncertain behavior of skin: Secondary | ICD-10-CM | POA: Diagnosis not present

## 2019-05-06 DIAGNOSIS — H11432 Conjunctival hyperemia, left eye: Secondary | ICD-10-CM | POA: Diagnosis not present

## 2019-05-06 DIAGNOSIS — R0602 Shortness of breath: Secondary | ICD-10-CM | POA: Diagnosis not present

## 2019-05-06 DIAGNOSIS — H02115 Cicatricial ectropion of left lower eyelid: Secondary | ICD-10-CM | POA: Diagnosis not present

## 2019-05-06 DIAGNOSIS — L570 Actinic keratosis: Secondary | ICD-10-CM | POA: Diagnosis not present

## 2019-05-08 ENCOUNTER — Ambulatory Visit (INDEPENDENT_AMBULATORY_CARE_PROVIDER_SITE_OTHER): Payer: Medicare Other

## 2019-05-08 DIAGNOSIS — I493 Ventricular premature depolarization: Secondary | ICD-10-CM | POA: Diagnosis not present

## 2019-05-08 DIAGNOSIS — N35012 Post-traumatic membranous urethral stricture: Secondary | ICD-10-CM | POA: Diagnosis not present

## 2019-05-13 ENCOUNTER — Telehealth: Payer: Self-pay

## 2019-05-13 NOTE — Telephone Encounter (Signed)
I signed this form and placed it to fax back to them this morning.

## 2019-05-13 NOTE — Telephone Encounter (Signed)
Received a call from [employer] stated she had faxed a form to be signed for patient to start Compression pump for left leg pleased confirm you received and once signed it is to be faxed back to [phone]

## 2019-05-17 ENCOUNTER — Other Ambulatory Visit: Payer: Self-pay

## 2019-05-19 NOTE — Progress Notes (Signed)
Cardiology Office Note:   Date:  05/21/2019  NAME:  Philip [name]    MRN: [id number] DOB:  11-Aug-1932   PCP:  Gayland Curry, DO  Cardiologist:  No primary care provider on file.   Referring MD: Gayland Curry, DO   Chief Complaint  Patient presents with  . Fatigue   History of Present Illness:   Philip [name] is a 86 y.o. male with a hx of aortitis, prostate CA, RLE sarcoma, OSA who presents for follow-up of fatigue. Echo normal without significant valvular heart disease. BNP 99. He did have a PVC on ECG and irregular rhythm, we planned for zio but not done yet.   He reports he continues to have fatigue. He is able to complete activities such as walking just reports his energy is low. We did check a BNP which was normal and echocardiogram which shows normal left ventricular function and no evidence of diastolic heart failure. Most recent thyroid studies were normal. He denies any shortness of breath or chest pain. His Zio patch is pending for PVCs. However, given normal LV function I highly doubt this will be an issue for him. I did ask him if he feels that he is depressed. He reports that he possibly is. He reports that he possibly has PTSD from his childhood. He did live through the [place] in [place] during [place]. His wife has also suggested this may be a possibility. He reports that he is going to discuss talking with someone about issues. He is also had a lot of life changes. 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 Overall I suspect his fatigue is related to possible depression and with some deconditioning. He has no chest pain suggest underlying CAD. I do not feel strongly about a stress test.  Problem List 1. 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 Prostate CA 3. 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 Lymphedema  5. OSA  Past Medical History: Past Medical  History:  Diagnosis Date  . Aneurysm of left renal artery (HCC)    per CT 08-20-2015 stable 43mm  . Aortitis syndrome ([place]) rheumotologist-  dr Charlestine Night   IgG4 syndrome-- treatment prednisone- (effects abdomine)  . Basal cell carcinoma   . Bladder neck contracture   . BPH (benign prostatic hyperplasia)   . CKD (chronic kidney disease), stage III   . Complication of anesthesia    post op acute urinary retention  . DDD (degenerative disc disease)   . Foley catheter in place    placed 02-29-2019 secondary to urinary retention post surgery  . Full dentures   . GERD (gastroesophageal reflux disease)   . History of external beam radiation therapy 2002   prostate cancer  and boost with radioative prostate seed implants  . History of prostate cancer DX  2002   S/P EXTERNAL RADIATION/ RADIOACTIVE SEED IMPLANTS  2003  . History of squamous cell carcinoma excision    2012--  RIGHT LOWER EXTREM;  2019 LEFT LOWER EXTREMITIY  . HTN (hypertension)   . Hyperlipemia   . Hypothyroidism    endocrinologist-  dr Chalmers Cater--- per pt takes his own thyroid med. called NP Thyroid 10 mg daily, does not take synthroid  . Large vessel vasculitis (HCC)    aortitis--- rheumotologist-  dr Charlestine Night  . Left renal artery stenosis (HCC)    proximal per CT 08-20-2015  but patent  . Left renal atrophy   . Macular degeneration   . Nocturia   . OSA on CPAP    pt retested 11-13-2016 at [facility] w/ dr ather-- moderate to severe OSA , AHI 16.4/h/  03-24-2017 per pt is consistant using every night  . Pulmonary nodule    per CT 02-28-2017 in [place] in epic done at [place],  LUL nodule , not mets  . Renal cyst, right   . Restless legs syndrome (RLS) 10/31/2011  . S/P radiation therapy     for tonsillar cancer (head and neck) completed 01-26-2012 at [facility])  . Saliva decreased   . Sarcoma of lower extremity, right ([place]) oncolgoist-  dr Juluis Rainier ([place])   dx 02-14-2017 w/ needle core bx;  high grade  pleomorphic spindle cell sarcoma , grade 2 (cT2N0M0); 03-15-2017  radical resection sarcoma tumor right lower leg  and plan radiation  . Self-catheterizes urinary bladder    QID   AND   PRN  . 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 Urethral stricture    chronic---  post urethral dilation's  . Urinary retention with incomplete bladder emptying   . Vitamin B 12 deficiency 01/28/2011  . Vitamin D deficiency     Past Surgical History: Past Surgical History:  Procedure Laterality Date  . BALLOON DILATION N/A 11/10/2014   Procedure: CYSTO BALLOON DILATION AND RETROGRADE URETHROGRAM ;  Surgeon: Bjorn Loser, MD;  Location: [facility];  Service: Urology;  Laterality: N/A;  . CATARACT EXTRACTION W/ INTRAOCULAR LENS  IMPLANT, BILATERAL    . CYSTO/ BALLOON DILATION OF  URETHRAL STRICTURE  12-25-2010  . CYSTOSCOPY WITH RETROGRADE URETHROGRAM N/A 03/30/2016   Procedure: CYSTOSCOPY WITH RETROGRADE URETHROGRAM AND BALLOON DILATION with cystogram;  Surgeon: Bjorn Loser, MD;  Location[facility];  Service: Urology;  Laterality: N/A;  . CYSTOSCOPY WITH URETHRAL DILATATION  05/31/2011   Procedure: CYSTOSCOPY WITH URETHRAL DILATATION;  Surgeon: Reece Packer, MD;  Location: [place];  Service: Urology;  Laterality: N/A;  BALLOON DILATION  . CYSTOSCOPY WITH URETHRAL DILATATION N/A 09/13/2016   Procedure: CYSTOSCOPY WITH URETHRAL BALLOON DILATATION;  Surgeon: Bjorn Loser, MD;  Location: [place];  Service: Urology;  Laterality: N/A;  . CYSTOSCOPY WITH URETHRAL DILATATION N/A 03/30/2017   Procedure: CYSTOSCOPY WITH BALLOON URETHRAL DILATATION;  Surgeon:  Bjorn Loser, MD;  Location: [place];  Service: Urology;  Laterality: N/A;  . CYSTOSCOPY/RETROGRADE/URETEROSCOPY N/A 05/22/2012   Procedure: CYSTOSCOPY BALLOON DILATION RETROGRADE URETEROGRAM ;  Surgeon: Reece Packer, MD;  Location: [place];  Service: Urology;  Laterality: N/A;  . CYTSO/  DILATATION URETHRAL STRICTURE/  BX PROSTATIC URETHRA/  REMOVAL FOREIGN BODIES  06-25-2010   [facility]  . INGUINAL HERNIA REPAIR Right 2000  . MOHS SURGERY  01/ 2019    [facility]   left lower leg for SCC  . RADICAL RESECTION OF SARCOMA TUMOR   03-15-2017   [facility]   RIGHT LOWER LEG , CALF AREA  . RADIOACTIVE SEED IMPLANTS, PROSTATE  JAN  2003  . SKIN LESION EXCISION  07/2012   MOST  right shoulder    Current Medications: Current Meds  Medication Sig  . buPROPion (WELLBUTRIN) 100 MG tablet Take 1 tablet by mouth twice daily  . cholecalciferol (VITAMIN D) 1000 units tablet Take 2,000 Units by mouth daily.  . Coenzyme Q10 (COQ10) 100 MG CAPS Take 100 mg by mouth daily. Take one daily  . Cyanocobalamin-Methylcobalamin 600-600 MCG SUBL Place under the tongue daily.  Marland Kitchen DIGESTIVE ENZYMES PO Take 500 mg by mouth every morning.   . fish oil-omega-3 fatty acids 1000 MG capsule Take 2 g by mouth daily. Name:  Natural Factors Omega-3  . lisinopril (ZESTRIL) 10 MG tablet Take 1 tablet by mouth once daily  . Multiple Vitamins-Minerals (ICAPS AREDS 2 PO) Take 2 tablets by mouth daily.  . NP THYROID 90 MG tablet Take 90 mg by mouth daily.  . predniSONE (DELTASONE) 5 MG tablet Take 5 mg by mouth daily with breakfast.  . RESVERATROL PO Take 100 mg by mouth daily.   . TURMERIC CURCUMIN PO Take 2 tablets by mouth daily.   Marland Kitchen UNABLE TO FIND every other day. Mercie Eon take once daily - Vitamin E  . UNABLE TO FIND [place] take once daily  . UNABLE TO FIND [place] take 4 tablets once daily  . UNABLE TO FIND Comprehensive immune support take 2 tablets once daily  . UNABLE TO FIND  Modified citrus pectin take 4 tablets once daily  . UNABLE TO FIND Celery seed take 2 tablets by mouth once daily     Allergies:    Cefixime, Contrast media [iodinated diagnostic agents], Macrobid [nitrofurantoin macrocrystal], Nitrofurantoin, Nsaids, and Cephalosporins   Social History: Social History   Socioeconomic History  . Marital status: Married    Spouse name: Not on file  . Number of children: 4  . Years of education: college  . Highest education level: Not on file  Occupational History  . Occupation: retired  Tobacco Use  . Smoking status: Never Smoker  . Smokeless tobacco: Never Used  Substance and Sexual Activity  . Alcohol use: Yes    Alcohol/week: 7.0 standard drinks    Types: 7 Glasses of wine per week    Comment: 8oz, glass or red wine  . Drug use: No  . Sexual activity: Not on file  Other Topics Concern  . 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    Social Determinants of Health   Financial Resource Strain:   . Difficulty of Paying Living Expenses:   Food Insecurity:   . Worried About Charity fundraiser in the Last Year:   . Arboriculturist in the Last Year:   Transportation Needs:   . Film/video editor (Medical):   Marland Kitchen Lack of Transportation (Non-Medical):   Physical Activity:   . Days of Exercise per Week:   . Minutes of Exercise per Session:   Stress:   . Feeling of Stress :   Social Connections:   . Frequency of Communication with Friends and Family:   . Frequency of Social Gatherings with Friends and Family:   . Attends Religious Services:   . Active Member of Clubs or Organizations:   . Attends Archivist Meetings:   Marland Kitchen Marital Status:      Family History: The patient's family history includes Stroke in his father.  ROS:   All other ROS reviewed and negative. Pertinent positives noted in the  HPI.      EKGs/Labs/Other Studies Reviewed:   The following studies were personally reviewed by me today:   TTE 05/06/2019 1. Normal GLS -21.5. Left ventricular ejection fraction, by estimation,  is 55 to 60%. The left ventricle has normal function. The left ventricle  has no regional wall motion abnormalities. There is mild left ventricular  hypertrophy. Left ventricular  diastolic parameters were normal.  2. Right ventricular systolic function is normal. The right ventricular  size is normal. There is normal pulmonary artery systolic pressure.  3. The mitral valve is normal in structure. Trivial mitral valve  regurgitation. No evidence of mitral stenosis.  4. The aortic valve is tricuspid. Aortic valve regurgitation is not  visualized. Mild aortic valve sclerosis is present, with no evidence of  aortic valve stenosis.  5. Aortic dilatation noted. There is mild dilatation of the aortic root  measuring 42 mm.  6. The inferior vena cava is normal in size with greater than 50%  respiratory variability, suggesting right atrial pressure of 3 mmHg.   Recent Labs: 01/31/2019: BUN 25; Creatinine 1.5; Hemoglobin 14.0; Platelets 233; Potassium 4.7; Sodium 141; TSH 4.21 04/26/2019: BNP 99.5   Recent Lipid Panel    Component Value Date/Time   CHOL 253 (A) 05/03/2018 0000   TRIG 81 05/03/2018 0000   HDL 79 (A) 05/03/2018 0000   LDLCALC 157 05/03/2018 0000    Physical Exam:   VS:  BP (!) 150/90   Pulse 69   Temp (!) 97.2 F (36.2 C)   Ht 5\' 11"  (1.803 m)   Wt 164 lb 3.2 oz (74.5 kg)   SpO2 99%   BMI 22.90 kg/m    Wt Readings from Last 3 Encounters:  05/21/19 164 lb 3.2 oz (74.5 kg)  04/26/19 161 lb (73 kg)  03/20/19 164 lb (74.4 kg)    General: Well nourished, well developed, in no acute distress Heart: Atraumatic, normal size  Eyes: PEERLA, EOMI  Neck: Supple, no JVD Endocrine: No thryomegaly Cardiac: Normal S1, S2; RRR; no murmurs, rubs, or gallops Lungs: Clear to  auscultation bilaterally, no wheezing, rhonchi or rales  Abd: Soft, nontender, no hepatomegaly  Ext: No edema, pulses 2+ Musculoskeletal: No deformities, BUE and BLE strength normal and equal Skin: Warm and dry, no rashes   Neuro: Alert and oriented to person, place, time, and situation, CNII-XII grossly intact, no focal deficits  Psych: Normal mood and affect   ASSESSMENT:   Philip [name] is a 86 y.o. male who presents for the following: 1. SOB (shortness of breath) on exertion   2. Chronic fatigue   3. PVC (premature ventricular contraction)   4. Large vessel vasculitis (HCC)     PLAN:   1. SOB (shortness of breath) on exertion 2. Chronic fatigue -Recent thyroid studies are normal. BNP 99 which is normal. Echocardiogram shows normal left ventricular function without any significant diastolic heart failure. His cardiovascular examination is benign without any evidence of murmurs or congestive heart failure. His symptoms appear to be low energy at rest and stress. He has no symptoms of chest pain to suggest underlying CAD. Overall, I did discuss with him the possibility of depression. He does report changes in his life that have occurred with aging as well as possible PTSD due to living in [place] during [place]. I do think this could be a possibility as he does have difficulty concentrating as well as fatigue and poor sleep. There is no sleep apnea reported per  his report. I see no real strong cardiovascular reason for his fatigue. We will follow-up the results of his Zio patch by phone. However given his normal left ventricular function I do not suspect that he will have any significant PVC burden. His blood pressure is a bit elevated and he will keep an eye on this. This could also be the etiology for his fatigue. We will have him follow-up with his primary care physician for management of this. He will see [place] as needed.  3. PVC (premature ventricular contraction) -EKG with  PVCs. -Recent echo with normal left ventricular function. -3-day Zio patch is pending. We will follow-up the results of this by phone. I do not suspect he will have significant PVCs as his left ventricular function is normal. He will see [place] as needed.  4. Large vessel vasculitis ([place]) -Remains on treatment for this.  Disposition: Return if symptoms worsen or fail to improve.  Medication Adjustments/Labs and Tests Ordered: Current medicines are reviewed at length with the patient today.  Concerns regarding medicines are outlined above.  No orders of the defined types were placed in this encounter.  No orders of the defined types were placed in this encounter.   Patient Instructions  Medication Instructions:  The current medical regimen is effective;  continue present plan and medications.  *If you need a refill on your cardiac medications before your next appointment, please call your pharmacy*    Follow-Up: At [facility], you and your health needs are our priority.  As part of our continuing mission to provide you with exceptional heart care, we have created designated Provider Care Teams.  These Care Teams include your primary Cardiologist (physician) and Advanced Practice Providers (APPs -  Physician Assistants and Nurse Practitioners) who all work together to provide you with the care you need, when you need it.  We recommend signing up for the patient portal called "MyChart".  Sign up information is provided on this After Visit Summary.  MyChart is used to connect with patients for Virtual Visits (Telemedicine).  Patients are able to view lab/test results, encounter notes, upcoming appointments, etc.  Non-urgent messages can be sent to your provider as well.   To learn more about what you can do with MyChart, go to [email or web address].    Your next appointment:   As needed  The format for your next appointment:   In Person  Provider:   Eleonore Chiquito, MD         Time Spent with Patient: I have spent a total of 25 minutes with patient reviewing hospital notes, telemetry, EKGs, labs and examining the patient as well as establishing an assessment and plan that was discussed with the patient.  > 50% of time was spent in direct patient care.  Signed, Addison Naegeli. Audie Box, [place]  [address], [place], [place] [address] [phone]  05/21/2019 2:46 PM

## 2019-05-21 ENCOUNTER — Other Ambulatory Visit: Payer: Self-pay

## 2019-05-21 ENCOUNTER — Ambulatory Visit (INDEPENDENT_AMBULATORY_CARE_PROVIDER_SITE_OTHER): Payer: Medicare Other | Admitting: Cardiovascular Disease

## 2019-05-21 ENCOUNTER — Encounter: Payer: Self-pay | Admitting: Cardiovascular Disease

## 2019-05-21 VITALS — BP 150/90 | HR 69 | Temp 97.2°F | Ht 71.0 in | Wt 164.2 lb

## 2019-05-21 DIAGNOSIS — R5382 Chronic fatigue, unspecified: Secondary | ICD-10-CM | POA: Diagnosis not present

## 2019-05-21 DIAGNOSIS — M316 Other giant cell arteritis: Secondary | ICD-10-CM | POA: Diagnosis not present

## 2019-05-21 DIAGNOSIS — I493 Ventricular premature depolarization: Secondary | ICD-10-CM | POA: Diagnosis not present

## 2019-05-21 DIAGNOSIS — R0602 Shortness of breath: Secondary | ICD-10-CM | POA: Diagnosis not present

## 2019-05-21 NOTE — Patient Instructions (Signed)
Medication Instructions:  The current medical regimen is effective;  continue present plan and medications.  *If you need a refill on your cardiac medications before your next appointment, please call your pharmacy*    Follow-Up: At [facility], you and your health needs are our priority.  As part of our continuing mission to provide you with exceptional heart care, we have created designated Provider Care Teams.  These Care Teams include your primary Cardiologist (physician) and Advanced Practice Providers (APPs -  Physician Assistants and Nurse Practitioners) who all work together to provide you with the care you need, when you need it.  We recommend signing up for the patient portal called "MyChart".  Sign up information is provided on this After Visit Summary.  MyChart is used to connect with patients for Virtual Visits (Telemedicine).  Patients are able to view lab/test results, encounter notes, upcoming appointments, etc.  Non-urgent messages can be sent to your provider as well.   To learn more about what you can do with MyChart, go to [email or web address].    Your next appointment:   As needed  The format for your next appointment:   In Person  Provider:   [place] O'Neal, MD      

## 2019-05-27 DIAGNOSIS — I493 Ventricular premature depolarization: Secondary | ICD-10-CM | POA: Diagnosis not present

## 2019-06-07 DIAGNOSIS — H532 Diplopia: Secondary | ICD-10-CM | POA: Diagnosis not present

## 2019-06-07 DIAGNOSIS — H353132 Nonexudative age-related macular degeneration, bilateral, intermediate dry stage: Secondary | ICD-10-CM | POA: Diagnosis not present

## 2019-06-07 DIAGNOSIS — Z961 Presence of intraocular lens: Secondary | ICD-10-CM | POA: Diagnosis not present

## 2019-06-07 DIAGNOSIS — H43813 Vitreous degeneration, bilateral: Secondary | ICD-10-CM | POA: Diagnosis not present

## 2019-06-10 DIAGNOSIS — I129 Hypertensive chronic kidney disease with stage 1 through stage 4 chronic kidney disease, or unspecified chronic kidney disease: Secondary | ICD-10-CM | POA: Diagnosis not present

## 2019-06-10 DIAGNOSIS — M316 Other giant cell arteritis: Secondary | ICD-10-CM | POA: Diagnosis not present

## 2019-06-10 DIAGNOSIS — E538 Deficiency of other specified B group vitamins: Secondary | ICD-10-CM | POA: Diagnosis not present

## 2019-06-10 DIAGNOSIS — C499 Malignant neoplasm of connective and soft tissue, unspecified: Secondary | ICD-10-CM | POA: Diagnosis not present

## 2019-06-10 DIAGNOSIS — N1831 Chronic kidney disease, stage 3a: Secondary | ICD-10-CM | POA: Diagnosis not present

## 2019-06-10 LAB — VITAMIN B12: Vitamin B-12: 360

## 2019-06-11 DIAGNOSIS — Z85828 Personal history of other malignant neoplasm of skin: Secondary | ICD-10-CM | POA: Diagnosis not present

## 2019-06-11 DIAGNOSIS — I77810 Thoracic aortic ectasia: Secondary | ICD-10-CM | POA: Diagnosis not present

## 2019-06-11 DIAGNOSIS — M7501 Adhesive capsulitis of right shoulder: Secondary | ICD-10-CM | POA: Diagnosis not present

## 2019-06-11 DIAGNOSIS — Z08 Encounter for follow-up examination after completed treatment for malignant neoplasm: Secondary | ICD-10-CM | POA: Diagnosis not present

## 2019-06-11 DIAGNOSIS — R918 Other nonspecific abnormal finding of lung field: Secondary | ICD-10-CM | POA: Diagnosis not present

## 2019-06-11 DIAGNOSIS — C499 Malignant neoplasm of connective and soft tissue, unspecified: Secondary | ICD-10-CM | POA: Diagnosis not present

## 2019-06-11 DIAGNOSIS — Z923 Personal history of irradiation: Secondary | ICD-10-CM | POA: Diagnosis not present

## 2019-06-11 DIAGNOSIS — I871 Compression of vein: Secondary | ICD-10-CM | POA: Diagnosis not present

## 2019-06-11 DIAGNOSIS — C4921 Malignant neoplasm of connective and soft tissue of right lower limb, including hip: Secondary | ICD-10-CM | POA: Diagnosis not present

## 2019-06-13 ENCOUNTER — Telehealth: Payer: Self-pay | Admitting: *Deleted

## 2019-06-13 DIAGNOSIS — E538 Deficiency of other specified B group vitamins: Secondary | ICD-10-CM

## 2019-06-13 NOTE — Telephone Encounter (Signed)
Patient called and stated that he is done with the B12 Injections and requesting his levels to be checked.   Reviewed OV Note Dated 03/20/2019: Assessment/Plan 1. B12 deficiency -he is willing to try b12 injections due to his deficiency so Rx sent to pharmacy--he says he already has needles/syringes from before when he took it - cyanocobalamin (,VITAMIN B-12,) 1000 MCG/ML injection; Inject 1 mL (1,000 mcg total) into the muscle every 30 (thirty) days for 3 doses.  Dispense: 10 mL; Refill: 0 -recheck B12 level after 3 doses    I called and spoke with Patriciaann Clan Nurse, and she stated she needed an order faxed [phone] Dx: E53.8 (B12 Deficiency) Please Advise.

## 2019-06-13 NOTE — Telephone Encounter (Signed)
Ok, let's do an order for him to get his b12 checked next week to see if he needs to continue the injections

## 2019-06-14 ENCOUNTER — Encounter: Payer: Self-pay | Admitting: *Deleted

## 2019-06-14 MED ORDER — CYANOCOBALAMIN 1000 MCG/ML IJ SOLN: 1000.0000 ug | INTRAMUSCULAR | 0 refills | Status: DC

## 2019-06-14 NOTE — Progress Notes (Signed)
Kidney Dr. Vonzella Nipple lab

## 2019-06-14 NOTE — Telephone Encounter (Signed)
Fine to renew B12 order.  If you can find out the exact date of his B12, we could abstract into his chart.  Thanks

## 2019-06-14 NOTE — Telephone Encounter (Signed)
Patient stated that his Kidney Dr. Georgina Quint his Vitamin B12 this week and he got the results back and it was 360. Patient is requesting to continue his Vitamin B12 Shots.  Needs refill sent to his pharmacy so he can continue his injections.  Please Advise.    Pharmacy: [facility]

## 2019-06-14 NOTE — Telephone Encounter (Signed)
Patient notified and agreed. Stated that the bloodwork was done on Monday 06/10/19. Abstracted.  Pended Rx and sent to Dr. Mariea Clonts for approval.

## 2019-06-18 DIAGNOSIS — L905 Scar conditions and fibrosis of skin: Secondary | ICD-10-CM | POA: Diagnosis not present

## 2019-06-18 DIAGNOSIS — H11432 Conjunctival hyperemia, left eye: Secondary | ICD-10-CM | POA: Diagnosis not present

## 2019-06-18 DIAGNOSIS — H02115 Cicatricial ectropion of left lower eyelid: Secondary | ICD-10-CM | POA: Diagnosis not present

## 2019-06-18 HISTORY — PX: ECTROPION REPAIR: SHX357

## 2019-06-27 DIAGNOSIS — L570 Actinic keratosis: Secondary | ICD-10-CM | POA: Diagnosis not present

## 2019-06-27 DIAGNOSIS — C44229 Squamous cell carcinoma of skin of left ear and external auricular canal: Secondary | ICD-10-CM | POA: Diagnosis not present

## 2019-07-01 ENCOUNTER — Encounter (INDEPENDENT_AMBULATORY_CARE_PROVIDER_SITE_OTHER): Payer: Self-pay | Admitting: Ophthalmology

## 2019-07-01 ENCOUNTER — Ambulatory Visit (INDEPENDENT_AMBULATORY_CARE_PROVIDER_SITE_OTHER): Payer: Medicare Other | Admitting: Ophthalmology

## 2019-07-01 ENCOUNTER — Other Ambulatory Visit: Payer: Self-pay

## 2019-07-01 DIAGNOSIS — H353114 Nonexudative age-related macular degeneration, right eye, advanced atrophic with subfoveal involvement: Secondary | ICD-10-CM | POA: Insufficient documentation

## 2019-07-01 DIAGNOSIS — H353132 Nonexudative age-related macular degeneration, bilateral, intermediate dry stage: Secondary | ICD-10-CM

## 2019-07-01 NOTE — Assessment & Plan Note (Addendum)
The nature of age realated macular degeneration (ARMD)is explained as follows: The dry form refers to the progressive loss of normal blood supply to the central vision as a result of a combination of factors which include aging blood supply (arteriosclerosis, hardening of the arteries), genetics, smoking habits, and history of hypertension. Currently, no eye medications or vitamins slow this type of aging effect upon vision, however cessation of smoking and controlling hypertension help slow the disorder.    The following analogy helps explain this: I describe the dry form of ARMD like a house of the same age as your eyes, which shows typical wear and tear of age upon the house structure and appearance. Like the aging house which can fall down structurally, the dry form of ARMD can deteriorate the structure of the macula (center) of the retina, most often gradually and affect central vision tasks such as reading and driving. The wet form of ARMD refers to the development of abnormally growing blood vessels usually near or under the central vision, with potential risk of permanent visual changes or vision losses. This complication of the Dry form of ARMD may be moderately reduced by use of AREDS 2 formula multivitamins daily. I describe the Wet form of ARMD as like the development of a fire in an aging house (DRY ARMD). It may develop suddenly, progress rapidly and be destructive based on where it starts and how big the fire is when found. In the eye, the house fire analogy refers to the abnormal blood vessels growing destructively near the central vison in the retina, or film of the eye. Halting the growth of blood vessels with laser (hot or cold) or injectable medications is best way "to put the fire out". Many patients will experience a stabilization or even improvement in vision with a treatment course, while others may still face a loss of vision. The use of injectable medications has revolutionized therapy  and is currently the only proven therapy to provide the chance of stable or improved acuity from new and recent destructive wet ARMD.The nature of age--related macular degeneration was discussed with the patient as well as the distinction between dry and wet types. Checking an Amsler Grid daily with advice to return immediately should a distortion develop, was given to the patient. The patient 's smoking status now and in the past was determined and advice based on the AREDS study was provided regarding the consumption of antioxidant supplements. AREDS 2 vitamin formulation was recommended. Consumption of dark leafy vegetables and fresh fruits of various colors was recommended. Treatment modalities for wet macular degeneration particularly the use of intravitreal injections of anti-blood vessel growth factors was discussed with the patient. Avastin, Lucentis, and Eylea are the available options. On occasion, therapy includes the use of photodynamic therapy and thermal laser. Stressed to the patient do not rub eyes. All patient questions were answered.

## 2019-07-01 NOTE — Progress Notes (Signed)
07/01/2019     CHIEF COMPLAINT Patient presents for Retina Evaluation   HISTORY OF PRESENT ILLNESS: Philip [name] is a 86 y.o. male who presents to the clinic today for:   HPI    Retina Evaluation    In both eyes.  Duration of 3 weeks.  Associated Symptoms Negative for Flashes and Floaters.  Context:  distance vision.  Treatments tried include no treatments.          Comments    Referred by Kathrin Penner - Dry AMD OU Patient states that he is not having any issues with his vision, that Kathrin Penner found a problem in his eyes.       Last edited by Gerda Diss on 07/01/2019  2:26 PM. (History)      Referring physician: Shon Hough, MD [place],  [place] [address]  HISTORICAL INFORMATION:   Selected notes from the [facility]: No current outpatient medications on file. (Ophthalmic Drugs)   No current facility-administered medications for this visit. (Ophthalmic Drugs)   Current Outpatient Medications (Other)  Medication Sig  . buPROPion (WELLBUTRIN) 100 MG tablet Take 1 tablet by mouth twice daily  . cholecalciferol (VITAMIN D) 1000 units tablet Take 2,000 Units by mouth daily.  . Coenzyme Q10 (COQ10) 100 MG CAPS Take 100 mg by mouth daily. Take one daily  . cyanocobalamin (,VITAMIN B-12,) 1000 MCG/ML injection Inject 1 mL (1,000 mcg total) into the muscle every 30 (thirty) days.  . Cyanocobalamin-Methylcobalamin 600-600 MCG SUBL Place under the tongue daily.  Marland Kitchen DIGESTIVE ENZYMES PO Take 500 mg by mouth every morning.   . fish oil-omega-3 fatty acids 1000 MG capsule Take 2 g by mouth daily. Name:  Natural Factors Omega-3  . lisinopril (ZESTRIL) 10 MG tablet Take 1 tablet by mouth once daily  . Multiple Vitamins-Minerals (ICAPS AREDS 2 PO) Take 2 tablets by mouth daily.  . NP THYROID 90 MG tablet Take 90 mg by mouth daily.  . predniSONE (DELTASONE) 5 MG tablet Take 5 mg by mouth daily with breakfast.  .  RESVERATROL PO Take 100 mg by mouth daily.   . TURMERIC CURCUMIN PO Take 2 tablets by mouth daily.   Marland Kitchen UNABLE TO FIND every other day. Mercie Eon take once daily - Vitamin E  . UNABLE TO FIND [place] take once daily  . UNABLE TO FIND [place] take 4 tablets once daily  . UNABLE TO FIND Comprehensive immune support take 2 tablets once daily  . UNABLE TO FIND Modified citrus pectin take 4 tablets once daily  . UNABLE TO FIND Celery seed take 2 tablets by mouth once daily   No current facility-administered medications for this visit. (Other)      REVIEW OF SYSTEMS:    ALLERGIES Allergies  Allergen Reactions  . Cefixime Rash  . Contrast Media [Iodinated Diagnostic Agents] Other (See Comments)    Avoid due to renal disease  . Macrobid [Nitrofurantoin Macrocrystal]   . Nitrofurantoin Swelling  . Nsaids Other (See Comments)    Has been told no NSAIDs, Ibuprofen Renal insufficiency hx.   . Cephalosporins Rash    PAST MEDICAL HISTORY Past Medical History:  Diagnosis Date  . Aneurysm of left renal artery (HCC)    per CT 08-20-2015 stable 81mm  . 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 Basal cell carcinoma   . Bladder  neck contracture   . BPH (benign prostatic hyperplasia)   . CKD (chronic kidney disease), stage III   . Complication of anesthesia    post op acute urinary retention  . DDD (degenerative disc disease)   . Foley catheter in place    placed 02-29-2019 secondary to urinary retention post surgery  . Full dentures   . GERD (gastroesophageal reflux disease)   . History of external beam radiation therapy 2002   prostate cancer  and boost with radioative prostate seed implants  . History of prostate cancer DX  2002   S/P EXTERNAL RADIATION/ RADIOACTIVE SEED IMPLANTS  2003  . History of squamous cell carcinoma excision    2012--  RIGHT LOWER EXTREM;  2019 LEFT LOWER EXTREMITIY  .  HTN (hypertension)   . Hyperlipemia   . Hypothyroidism    endocrinologist-  dr Chalmers Cater--- per pt takes his own thyroid med. called NP Thyroid 10 mg daily, does not take synthroid  . Large vessel vasculitis (HCC)    aortitis--- rheumotologist-  dr Charlestine Night  . Left renal artery stenosis (HCC)    proximal per CT 08-20-2015 but patent  . Left renal atrophy   . Macular degeneration   . Nocturia   . OSA on CPAP    pt retested 11-13-2016 at [facility] w/ dr ather-- moderate to severe OSA , AHI 16.4/h/  03-24-2017 per pt is consistant using every night  . Pulmonary nodule    per CT 02-28-2017 in [place] in epic done at [facility],  LUL nodule , not mets  . Renal cyst, right   . Restless legs syndrome (RLS) 10/31/2011  . S/P radiation therapy     for tonsillar cancer (head and neck) completed 01-26-2012 at [facility])  . Saliva decreased   . Sarcoma of lower extremity, right ([place]) oncolgoist-  dr Juluis Rainier ([place])   dx 02-14-2017 w/ needle core bx;  high grade pleomorphic spindle cell sarcoma , grade 2 (cT2N0M0); 03-15-2017  radical resection sarcoma tumor right lower leg  and plan radiation  . Self-catheterizes urinary bladder    QID   AND   PRN  . 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 Urethral stricture    chronic---  post urethral dilation's  . Urinary retention with incomplete bladder emptying   . Vitamin B 12 deficiency 01/28/2011  . Vitamin D deficiency    Past Surgical History:  Procedure Laterality Date  . BALLOON DILATION N/A 11/10/2014   Procedure: CYSTO BALLOON DILATION AND RETROGRADE URETHROGRAM ;  Surgeon: Bjorn Loser, MD;  [facility];  Service: Urology;  Laterality:  N/A;  . CATARACT EXTRACTION W/ INTRAOCULAR LENS  IMPLANT, BILATERAL    . CYSTO/ BALLOON DILATION OF  URETHRAL STRICTURE  12-25-2010  . CYSTOSCOPY WITH RETROGRADE URETHROGRAM N/A 03/30/2016   Procedure: CYSTOSCOPY WITH RETROGRADE URETHROGRAM AND BALLOON DILATION with cystogram;  Surgeon: Bjorn Loser, MD;  Location: [facility];  Service: Urology;  Laterality: N/A;  . CYSTOSCOPY WITH URETHRAL DILATATION  05/31/2011   Procedure: CYSTOSCOPY WITH URETHRAL DILATATION;  Surgeon: Reece Packer, MD;  Location: [facility];  Service: Urology;  Laterality: N/A;  BALLOON DILATION  . CYSTOSCOPY WITH URETHRAL DILATATION  N/A 09/13/2016   Procedure: CYSTOSCOPY WITH URETHRAL BALLOON DILATATION;  Surgeon: Bjorn Loser, MD;  Location[facility];  Service: Urology;  Laterality: N/A;  . CYSTOSCOPY WITH URETHRAL DILATATION N/A 03/30/2017   Procedure: CYSTOSCOPY WITH BALLOON URETHRAL DILATATION;  Surgeon: Bjorn Loser, MD;  Location: [facility];  Service: Urology;  Laterality: N/A;  . CYSTOSCOPY/RETROGRADE/URETEROSCOPY N/A 05/22/2012   Procedure: CYSTOSCOPY BALLOON DILATION RETROGRADE URETEROGRAM ;  Surgeon: Reece Packer, MD;  Location: [facility];  Service: Urology;  Laterality: N/A;  . CYTSO/  DILATATION URETHRAL STRICTURE/  BX PROSTATIC URETHRA/  REMOVAL FOREIGN BODIES  06-25-2010   [facility]  . INGUINAL HERNIA REPAIR Right 2000  . MOHS SURGERY  01/ 2019    [facility]   left lower leg for SCC  . RADICAL RESECTION OF SARCOMA TUMOR   03-15-2017   [facility]   RIGHT LOWER LEG , CALF AREA  . RADIOACTIVE SEED IMPLANTS, PROSTATE  JAN  2003  . SKIN LESION EXCISION  07/2012   MOST  right shoulder    FAMILY HISTORY Family History  Problem Relation Age of Onset  . Stroke Father     SOCIAL HISTORY Social History   Tobacco Use  . Smoking status: Never Smoker  . Smokeless tobacco: Never Used  Vaping Use  . Vaping Use: Never used    Substance Use Topics  . Alcohol use: Yes    Alcohol/week: 7.0 standard drinks    Types: 7 Glasses of wine per week    Comment: 8oz, glass or red wine  . Drug use: No         OPHTHALMIC EXAM:  Base Eye Exam    Visual Acuity (Snellen - Linear)      Right Left   Dist cc 20/20-2 20/30+1       Tonometry (Tonopen, 2:31 PM)      Right Left   Pressure 14 13       Visual Fields (Counting fingers)      Left Right    Full Full       Neuro/Psych    Oriented x3: Yes   Mood/Affect: Normal       Dilation    Both eyes: 1.0% Mydriacyl, 2.5% Phenylephrine @ 2:31 PM        Slit Lamp and Fundus Exam    External Exam      Right Left   External Normal Normal       Slit Lamp Exam      Right Left   Lids/Lashes Normal Normal   Conjunctiva/Sclera White and quiet White and quiet   Cornea Clear Clear   Anterior Chamber Deep and quiet Deep and quiet   Iris Round and reactive Round and reactive   Lens Posterior chamber intraocular lens Posterior chamber intraocular lens   Anterior Vitreous Normal Normal       Fundus Exam      Right Left   Posterior Vitreous Posterior vitreous detachment Posterior vitreous detachment   Disc Normal Normal   C/D Ratio 0.2 0.2   Macula Intermediate age related macular degeneration, Retinal pigment epithelial atrophy, Soft drusen, no exudates, no hemorrhage, Retinal pigment epithelial mottling Intermediate age related macular degeneration, Retinal pigment epithelial atrophy, Soft drusen, no exudates, no hemorrhage, Retinal pigment epithelial mottling   Vessels Normal Normal   Periphery Normal Normal          IMAGING AND PROCEDURES  Imaging and Procedures for 07/01/19  OCT, Retina - OU - Both  Eyes       Right Eye Quality was good. Scan locations included subfoveal. Central Foveal Thickness: 313. Progression has no prior data. Findings include retinal drusen , pigment epithelial detachment.   Left Eye Quality was good. Scan locations  included subfoveal. Central Foveal Thickness: 366. Progression has no prior data. Findings include retinal drusen , pigment epithelial detachment.                 ASSESSMENT/PLAN:  Intermediate stage nonexudative age-related macular degeneration of both eyes The nature of age realated macular degeneration (ARMD)is explained as follows: The dry form refers to the progressive loss of normal blood supply to the central vision as a result of a combination of factors which include aging blood supply (arteriosclerosis, hardening of the arteries), genetics, smoking habits, and history of hypertension. Currently, no eye medications or vitamins slow this type of aging effect upon vision, however cessation of smoking and controlling hypertension help slow the disorder.    The following analogy helps explain this: I describe the dry form of ARMD like a house of the same age as your eyes, which shows typical wear and tear of age upon the house structure and appearance. Like the aging house which can fall down structurally, the dry form of ARMD can deteriorate the structure of the macula (center) of the retina, most often gradually and affect central vision tasks such as reading and driving. The wet form of ARMD refers to the development of abnormally growing blood vessels usually near or under the central vision, with potential risk of permanent visual changes or vision losses. This complication of the Dry form of ARMD may be moderately reduced by use of AREDS 2 formula multivitamins daily. I describe the Wet form of ARMD as like the development of a fire in an aging house (DRY ARMD). It may develop suddenly, progress rapidly and be destructive based on where it starts and how big the fire is when found. In the eye, the house fire analogy refers to the abnormal blood vessels growing destructively near the central vison in the retina, or film of the eye. Halting the growth of blood vessels with laser (hot or  cold) or injectable medications is best way "to put the fire out". Many patients will experience a stabilization or even improvement in vision with a treatment course, while others may still face a loss of vision. The use of injectable medications has revolutionized therapy and is currently the only proven therapy to provide the chance of stable or improved acuity from new and recent destructive wet ARMD.The nature of age--related macular degeneration was discussed with the patient as well as the distinction between dry and wet types. Checking an Amsler Grid daily with advice to return immediately should a distortion develop, was given to the patient. The patient 's smoking status now and in the past was determined and advice based on the AREDS study was provided regarding the consumption of antioxidant supplements. AREDS 2 vitamin formulation was recommended. Consumption of dark leafy vegetables and fresh fruits of various colors was recommended. Treatment modalities for wet macular degeneration particularly the use of intravitreal injections of anti-blood vessel growth factors was discussed with the patient. Avastin, Lucentis, and Eylea are the available options. On occasion, therapy includes the use of photodynamic therapy and thermal laser. Stressed to the patient do not rub eyes. All patient questions were answered.      ICD-10-CM   1. Intermediate stage nonexudative age-related macular degeneration of both eyes  H35.3132 OCT, Retina - OU - Both Eyes    1.  Recommend follow-up in 4 months for large subfoveal drusen with pigment epithelial detachments which are high risk for conversion to choroidal neovascular membrane  2.  Has a past history of obstructive sleep apnea currently not using CPAP as he feels he does not  Need it  3.  I encouraged the patient to use it on a nightly basis as this maximize his oxygenation to this critical area of visual acuity and functioning in addition to the critical  functioning of neurological system both cognitively and visually  Ophthalmic Meds Ordered this visit:  No orders of the defined types were placed in this encounter.      Return in about 4 months (around 10/31/2019) for DILATE OU, OCT.  There are no Patient Instructions on file for this visit.   Explained the diagnoses, plan, and follow up with the patient and they expressed understanding.  Patient expressed understanding of the importance of proper follow up care.   Clent Demark Shakti Fleer M.D. Diseases & Surgery of the Retina and Vitreous [facility] [place] 07/01/19     Abbreviations: M myopia (nearsighted); A astigmatism; H hyperopia (farsighted); P presbyopia; Mrx spectacle prescription;  CTL contact lenses; OD right eye; OS left eye; OU both eyes  XT exotropia; ET esotropia; PEK punctate epithelial keratitis; PEE punctate epithelial erosions; DES dry eye syndrome; MGD meibomian gland dysfunction; ATs artificial tears; PFAT's preservative free artificial tears; [place] nuclear sclerotic cataract; PSC posterior subcapsular cataract; ERM epi-retinal membrane; PVD posterior vitreous detachment; RD retinal detachment; DM diabetes mellitus; DR diabetic retinopathy; NPDR non-proliferative diabetic retinopathy; PDR proliferative diabetic retinopathy; CSME clinically significant macular edema; DME diabetic macular edema; dbh dot blot hemorrhages; CWS cotton wool spot; POAG primary open angle glaucoma; C/D cup-to-disc ratio; HVF humphrey visual field; GVF goldmann visual field; OCT optical coherence tomography; IOP intraocular pressure; BRVO Branch retinal vein occlusion; CRVO central retinal vein occlusion; CRAO central retinal artery occlusion; BRAO branch retinal artery occlusion; RT retinal tear; SB scleral buckle; PPV pars plana vitrectomy; VH Vitreous hemorrhage; PRP panretinal laser photocoagulation; IVK intravitreal kenalog; VMT vitreomacular traction; MH Macular hole;  NVD neovascularization  of the disc; NVE neovascularization elsewhere; AREDS age related eye disease study; ARMD age related macular degeneration; POAG primary open angle glaucoma; EBMD epithelial/anterior basement membrane dystrophy; ACIOL anterior chamber intraocular lens; IOL intraocular lens; PCIOL posterior chamber intraocular lens; Phaco/IOL phacoemulsification with intraocular lens placement; [place] photorefractive keratectomy; LASIK laser assisted in situ keratomileusis; HTN hypertension; DM diabetes mellitus; COPD chronic obstructive pulmonary disease

## 2019-07-04 DIAGNOSIS — H0100A Unspecified blepharitis right eye, upper and lower eyelids: Secondary | ICD-10-CM | POA: Diagnosis not present

## 2019-07-04 DIAGNOSIS — H0012 Chalazion right lower eyelid: Secondary | ICD-10-CM | POA: Diagnosis not present

## 2019-07-05 DIAGNOSIS — I890 Lymphedema, not elsewhere classified: Secondary | ICD-10-CM | POA: Diagnosis not present

## 2019-07-05 DIAGNOSIS — R600 Localized edema: Secondary | ICD-10-CM | POA: Diagnosis not present

## 2019-07-18 DIAGNOSIS — N365 Urethral false passage: Secondary | ICD-10-CM

## 2019-07-18 HISTORY — DX: Urethral false passage: N36.5

## 2019-08-01 DIAGNOSIS — H02115 Cicatricial ectropion of left lower eyelid: Secondary | ICD-10-CM | POA: Diagnosis not present

## 2019-08-01 DIAGNOSIS — Z09 Encounter for follow-up examination after completed treatment for conditions other than malignant neoplasm: Secondary | ICD-10-CM | POA: Diagnosis not present

## 2019-08-01 DIAGNOSIS — H019 Unspecified inflammation of eyelid: Secondary | ICD-10-CM | POA: Diagnosis not present

## 2019-08-07 ENCOUNTER — Non-Acute Institutional Stay: Payer: Medicare Other | Admitting: Internal Medicine

## 2019-08-07 ENCOUNTER — Other Ambulatory Visit: Payer: Self-pay

## 2019-08-07 ENCOUNTER — Encounter: Payer: Self-pay | Admitting: Internal Medicine

## 2019-08-07 VITALS — BP 116/70 | HR 75 | Temp 97.5°F | Ht 71.0 in | Wt 161.0 lb

## 2019-08-07 DIAGNOSIS — R5382 Chronic fatigue, unspecified: Secondary | ICD-10-CM | POA: Diagnosis not present

## 2019-08-07 DIAGNOSIS — Z66 Do not resuscitate: Secondary | ICD-10-CM

## 2019-08-07 DIAGNOSIS — E538 Deficiency of other specified B group vitamins: Secondary | ICD-10-CM

## 2019-08-07 DIAGNOSIS — G4733 Obstructive sleep apnea (adult) (pediatric): Secondary | ICD-10-CM

## 2019-08-07 NOTE — Progress Notes (Signed)
Location:  [facility] clinic Provider:  Derrin Currey L. Mariea Clonts, D.O., C.M.D.  Code Status: DNR Goals of Care:  Advanced Directives 08/07/2019  Does Patient Have a Medical Advance Directive? Yes  Type of Advance Directive Out of facility DNR (pink MOST or yellow form);Healthcare Power of Attorney  Does patient want to make changes to medical advance directive? No - Patient declined  Copy of [place] in Chart? Yes - validated most recent copy scanned in chart (See row information)  Would patient like information on creating a medical advance directive? -  Pre-existing out of facility DNR order (yellow form or pink MOST form) Pink MOST/Yellow Form most recent copy in chart - Physician notified to receive inpatient order     Chief Complaint  Patient presents with  . Medical Management of Chronic Issues    3 month follow up     HPI: Patient is a 86 y.o. male seen today for medical management of chronic diseases.    Walks from here to there and out of energy--100 yards.  Used to walk around campus at a rapid pace.  Now needs to sit down.  Not playing competitive table tennis partially due to his eyes.  Still happening despite the B12 shots.  6th shot should be in Sept.  Not feeling better.  He has started doing yoga again--went for third time yesterday--that helped.  Says he did not feel better after using the CPAP.  He thinks he does not breathe fully under normal circumstances.  bp improves if he does deep breathing first.  He will have to take a big breath about every 15 mins.  He wonders if his chiildhood smoke exposure has anything to do with it.  April echo and heart monitor were negative and did not explain his symptoms.    Admits his mental acuity has gone down.  He's doing the boat competition again here at [place] where the residents see whose boat can make it across the pool.  His team previously won this.  Past Medical History:  Diagnosis Date  . Aneurysm of left renal  artery (HCC)    per CT 08-20-2015 stable 28mm  . Aortitis syndrome ([place]) rheumotologist-  dr Charlestine Night   IgG4 syndrome-- treatment prednisone- (effects abdomine)  . Basal cell carcinoma   . Bladder neck contracture   . BPH (benign prostatic hyperplasia)   . CKD (chronic kidney disease), stage III   . Complication of anesthesia    post op acute urinary retention  . DDD (degenerative disc disease)   . Foley catheter in place    placed 02-29-2019 secondary to urinary retention post surgery  . Full dentures   . GERD (gastroesophageal reflux disease)   . History of external beam radiation therapy 2002   prostate cancer  and boost with radioative prostate seed implants  . History of prostate cancer DX  2002   S/P EXTERNAL RADIATION/ RADIOACTIVE SEED IMPLANTS  2003  . History of squamous cell carcinoma excision    2012--  RIGHT LOWER EXTREM;  2019 LEFT LOWER EXTREMITIY  . HTN (hypertension)   . Hyperlipemia   . Hypothyroidism    endocrinologist-  dr Chalmers Cater--- per pt takes his own thyroid med. called NP Thyroid 10 mg daily, does not take synthroid  . Large vessel vasculitis (HCC)    aortitis--- rheumotologist-  dr Charlestine Night  . Left renal artery stenosis (HCC)    proximal per CT 08-20-2015 but patent  . Left renal atrophy   .  Macular degeneration   . Nocturia   . OSA on CPAP    pt retested 11-13-2016 at [facility] w/ dr ather-- moderate to severe OSA , AHI 16.4/h/  03-24-2017 per pt is consistant using every night  . Pulmonary nodule    per CT 02-28-2017 in [place] in epic done at [place],  LUL nodule , not mets  . Renal cyst, right   . Restless legs syndrome (RLS) 10/31/2011  . S/P radiation therapy     for tonsillar cancer (head and neck) completed 01-26-2012 at [facility])  . Saliva decreased   . Sarcoma of lower extremity, right ([place]) oncolgoist-  dr Juluis Rainier ([place])   dx 02-14-2017 w/ needle core bx;  high grade pleomorphic spindle cell sarcoma , grade 2 (cT2N0M0);  03-15-2017  radical resection sarcoma tumor right lower leg  and plan radiation  . Self-catheterizes urinary bladder    QID   AND   PRN  . Tonsillar cancer ([place]) 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+) (Stage IVA)---- dx oct 2013  ----s/p concurent chemo and radiation/  ended 01-26-2012-- no surgical intervention---  residuals ( dry mouth, decreased saliva)   oncologist at [place]--  dr brizel--  HX OF -- NO RECURRENCE  . Urethral stricture    chronic---  post urethral dilation's  . Urinary retention with incomplete bladder emptying   . Vitamin B 12 deficiency 01/28/2011  . Vitamin D deficiency     Past Surgical History:  Procedure Laterality Date  . BALLOON DILATION N/A 11/10/2014   Procedure: CYSTO BALLOON DILATION AND RETROGRADE URETHROGRAM ;  Surgeon: Bjorn Loser, MD;  [facility];  Service: Urology;  Laterality: N/A;  . CATARACT EXTRACTION W/ INTRAOCULAR LENS  IMPLANT, BILATERAL    . CYSTO/ BALLOON DILATION OF  URETHRAL STRICTURE  12-25-2010  . CYSTOSCOPY WITH RETROGRADE URETHROGRAM N/A 03/30/2016   Procedure: CYSTOSCOPY WITH RETROGRADE URETHROGRAM AND BALLOON DILATION with cystogram;  Surgeon: Bjorn Loser, MD;  Location: [facility];  Service: Urology;  Laterality: N/A;  . CYSTOSCOPY WITH URETHRAL DILATATION  05/31/2011   Procedure: CYSTOSCOPY WITH URETHRAL DILATATION;  Surgeon: Reece Packer, MD;  Location: [place];  Service: Urology;  Laterality: N/A;  BALLOON DILATION  . CYSTOSCOPY WITH URETHRAL DILATATION N/A 09/13/2016   Procedure: CYSTOSCOPY WITH URETHRAL BALLOON DILATATION;  Surgeon: Bjorn Loser, MD;  Location: [place];  Service: Urology;  Laterality: N/A;  . CYSTOSCOPY WITH URETHRAL DILATATION N/A 03/30/2017   Procedure: CYSTOSCOPY WITH BALLOON URETHRAL DILATATION;  Surgeon: Bjorn Loser, MD;  Location: [place];  Service: Urology;   Laterality: N/A;  . CYSTOSCOPY/RETROGRADE/URETEROSCOPY N/A 05/22/2012   Procedure: CYSTOSCOPY BALLOON DILATION RETROGRADE URETEROGRAM ;  Surgeon: Reece Packer, MD;  Location: [place];  Service: Urology;  Laterality: N/A;  . CYTSO/  DILATATION URETHRAL STRICTURE/  BX PROSTATIC URETHRA/  REMOVAL FOREIGN BODIES  06-25-2010   [facility]  . INGUINAL HERNIA REPAIR Right 2000  . MOHS SURGERY  01/ 2019    [facility]   left lower leg for SCC  . RADICAL RESECTION OF SARCOMA TUMOR   03-15-2017   [facility]   RIGHT LOWER LEG , CALF AREA  . RADIOACTIVE SEED IMPLANTS, PROSTATE  JAN  2003  . SKIN LESION EXCISION  07/2012   MOST  right shoulder    Allergies  Allergen Reactions  . Cefixime Rash  . Contrast Media [Iodinated Diagnostic Agents] Other (See Comments)  Avoid due to renal disease  . Macrobid [Nitrofurantoin Macrocrystal]   . Nitrofurantoin Swelling  . Nsaids Other (See Comments)    Has been told no NSAIDs, Ibuprofen Renal insufficiency hx.   . Cephalosporins Rash    Outpatient Encounter Medications as of 08/07/2019  Medication Sig  . buPROPion (WELLBUTRIN) 100 MG tablet Take 1 tablet by mouth twice daily  . cholecalciferol (VITAMIN D) 1000 units tablet Take 2,000 Units by mouth daily.  . Coenzyme Q10 (COQ10) 100 MG CAPS Take 100 mg by mouth daily. Take one daily  . cyanocobalamin (,VITAMIN B-12,) 1000 MCG/ML injection Inject 1 mL (1,000 mcg total) into the muscle every 30 (thirty) days.  . Cyanocobalamin-Methylcobalamin 600-600 MCG SUBL Place under the tongue daily.  Marland Kitchen DIGESTIVE ENZYMES PO Take 500 mg by mouth every morning.   . fish oil-omega-3 fatty acids 1000 MG capsule Take 2 g by mouth daily. Name:  Natural Factors Omega-3  . lisinopril (ZESTRIL) 10 MG tablet Take 1 tablet by mouth once daily  . Multiple Vitamins-Minerals (ICAPS AREDS 2 PO) Take 2 tablets by mouth daily.  . NP THYROID 90 MG tablet Take 90 mg by mouth daily.  . predniSONE (DELTASONE) 5 MG tablet Take 5 mg by  mouth daily with breakfast.  . RESVERATROL PO Take 100 mg by mouth daily.   . TURMERIC CURCUMIN PO Take 2 tablets by mouth daily.   Marland Kitchen UNABLE TO FIND every other day. Mercie Eon take once daily - Vitamin E  . UNABLE TO FIND [place] Bone take once daily  . UNABLE TO FIND [place] take 4 tablets once daily  . UNABLE TO FIND Comprehensive immune support take 2 tablets once daily  . UNABLE TO FIND Modified citrus pectin take 4 tablets once daily  . UNABLE TO FIND Celery seed take 2 tablets by mouth once daily   No facility-administered encounter medications on file as of 08/07/2019.    Review of Systems:  Review of Systems  Constitutional: Negative for chills and fever.  HENT: Positive for hearing loss.   Eyes: Negative for blurred vision.  Respiratory: Negative for shortness of breath.   Cardiovascular: Positive for leg swelling. Negative for chest pain and palpitations.       Left lymphedema--improved with compression sock to thigh  Gastrointestinal: Negative for abdominal pain.  Genitourinary: Negative for dysuria.       Does I/O caths for retention, but occasionally has obstruction and has to go to ED--does not keep regular follow-ups to avoid this  Musculoskeletal: Negative for falls and joint pain.  Skin: Negative for itching and rash.  Neurological: Negative for dizziness and loss of consciousness.  Psychiatric/Behavioral: Positive for depression and memory loss. The patient is not nervous/anxious and does not have insomnia.     Health Maintenance  Topic Date Due  . TETANUS/TDAP  01/17/2013  . INFLUENZA VACCINE  08/18/2019  . COVID-19 Vaccine  Completed  . PNA vac Low Risk Adult  Completed    Physical Exam: Vitals:   08/07/19 1511  BP: 116/70  Pulse: 75  Temp: (!) 97.5 F (36.4 C)  SpO2: 97%  Weight: 161 lb (73 kg)  Height: 5\' 11"  (1.803 m)   Body mass index is 22.45 kg/m. Physical Exam Vitals reviewed.  Constitutional:      Appearance: Normal appearance.   Cardiovascular:     Rate and Rhythm: Normal rate and regular rhythm.     Pulses: Normal pulses.     Heart sounds: Normal heart  sounds.  Pulmonary:     Effort: Pulmonary effort is normal.     Breath sounds: Normal breath sounds. No wheezing, rhonchi or rales.  Abdominal:     General: Bowel sounds are normal.  Musculoskeletal:        General: Normal range of motion.     Left lower leg: Edema present.     Comments: Improved with the compression hose  Neurological:     General: No focal deficit present.     Mental Status: He is alert and oriented to person, place, and time.     Gait: Gait normal.     Comments: Starting to mix up appointments and not recalling all instructions well from appts  Psychiatric:        Mood and Affect: Mood normal.     Labs reviewed: Basic Metabolic Panel: Recent Labs    01/31/19 0800  NA 141  K 4.7  CL 102  CO2 30*  BUN 25*  CREATININE 1.5*  CALCIUM 9.2  TSH 4.21   Liver Function Tests: Recent Labs    01/31/19 0800  ALBUMIN 4.2   No results for input(s): LIPASE, AMYLASE in the last 8760 hours. No results for input(s): AMMONIA in the last 8760 hours. CBC: Recent Labs    01/31/19 0000  WBC 6.2  HGB 14.0  HCT 41  PLT 233   Lipid Panel: No results for input(s): CHOL, HDL, LDLCALC, TRIG, CHOLHDL, LDLDIRECT in the last 8760 hours. No results found for: HGBA1C  Assessment/Plan 1. Chronic fatigue -suspect due to advancing age and OSA that he opts not to treat (because I remember a time where he wore his cpap for a short time and actually felt better but he denies that ever happened) -r/o any tick-borne illness which labs will take a bit to return  2. OSA (obstructive sleep apnea) -recommended he get retitrated and wear his cpap  3. B12 deficiency -reports that the b12 injections are not helping him feel better, recheck levels  4. DNR (do not resuscitate) - Do not attempt resuscitation (DNR)  Labs/tests ordered:  B12, lyme/RMSF  labs in am Next appt:  3 mos med mgt after completes all b12 (last will be done in sept)  Philip [name] L. Adrijana Haros, D.O. [facility] [address], [place] [address] Cell Phone (Mon-Fri 8am-5pm):  [phone] On Call:  [phone] & follow prompts after 5pm & weekends Office Phone:  [phone] Office Fax:  [phone]

## 2019-08-08 ENCOUNTER — Telehealth: Payer: Self-pay | Admitting: *Deleted

## 2019-08-08 ENCOUNTER — Encounter: Payer: Self-pay | Admitting: Internal Medicine

## 2019-08-08 DIAGNOSIS — D649 Anemia, unspecified: Secondary | ICD-10-CM | POA: Diagnosis not present

## 2019-08-08 DIAGNOSIS — D519 Vitamin B12 deficiency anemia, unspecified: Secondary | ICD-10-CM | POA: Diagnosis not present

## 2019-08-08 DIAGNOSIS — I10 Essential (primary) hypertension: Secondary | ICD-10-CM | POA: Diagnosis not present

## 2019-08-08 DIAGNOSIS — E559 Vitamin D deficiency, unspecified: Secondary | ICD-10-CM | POA: Diagnosis not present

## 2019-08-08 DIAGNOSIS — R5381 Other malaise: Secondary | ICD-10-CM | POA: Diagnosis not present

## 2019-08-08 DIAGNOSIS — R5383 Other fatigue: Secondary | ICD-10-CM | POA: Diagnosis not present

## 2019-08-08 LAB — VITAMIN B12: Vitamin B-12: 367

## 2019-08-08 NOTE — Telephone Encounter (Signed)
Heather Notified and agreed.

## 2019-08-08 NOTE — Telephone Encounter (Signed)
[place] with me to check 25 hydroxy Vitamin D due to vitamin D deficiency.  He should be made aware that sometimes this is not covered by insurance if it's been checked anywhere else this year.

## 2019-08-08 NOTE — Telephone Encounter (Signed)
[place], [facility] Nurse called and stated that patient is having labwork done and wants his Vitamin D also checked.  Is this ok. And Diagnosis for nurse.  Please Advise.

## 2019-08-11 ENCOUNTER — Other Ambulatory Visit: Payer: Self-pay

## 2019-08-11 ENCOUNTER — Emergency Department (HOSPITAL_COMMUNITY)
Admission: EM | Admit: 2019-08-11 | Discharge: 2019-08-11 | Disposition: A | Discharge status: Home / Self Care | Payer: Medicare Other | Attending: Emergency Medicine | Admitting: Emergency Medicine

## 2019-08-11 ENCOUNTER — Encounter (HOSPITAL_COMMUNITY): Payer: Self-pay | Admitting: Emergency Medicine

## 2019-08-11 DIAGNOSIS — N183 Chronic kidney disease, stage 3 unspecified: Secondary | ICD-10-CM | POA: Diagnosis not present

## 2019-08-11 DIAGNOSIS — E039 Hypothyroidism, unspecified: Secondary | ICD-10-CM | POA: Diagnosis not present

## 2019-08-11 DIAGNOSIS — Z79899 Other long term (current) drug therapy: Secondary | ICD-10-CM | POA: Insufficient documentation

## 2019-08-11 DIAGNOSIS — I129 Hypertensive chronic kidney disease with stage 1 through stage 4 chronic kidney disease, or unspecified chronic kidney disease: Secondary | ICD-10-CM | POA: Insufficient documentation

## 2019-08-11 DIAGNOSIS — R339 Retention of urine, unspecified: Secondary | ICD-10-CM | POA: Diagnosis not present

## 2019-08-11 DIAGNOSIS — Z8546 Personal history of malignant neoplasm of prostate: Secondary | ICD-10-CM | POA: Insufficient documentation

## 2019-08-11 DIAGNOSIS — R338 Other retention of urine: Secondary | ICD-10-CM | POA: Diagnosis not present

## 2019-08-11 LAB — URINALYSIS, ROUTINE W REFLEX MICROSCOPIC
Bacteria, UA: NONE SEEN
Bilirubin Urine: NEGATIVE
Glucose, UA: NEGATIVE mg/dL
Ketones, ur: NEGATIVE mg/dL
Nitrite: NEGATIVE
Protein, ur: NEGATIVE mg/dL
Specific Gravity, Urine: 1.008 (ref 1.005–1.030)
pH: 5 (ref 5.0–8.0)

## 2019-08-11 NOTE — ED Triage Notes (Signed)
Patient self-caths and was unable to cath himself today to empty.

## 2019-08-11 NOTE — ED Notes (Signed)
Attempted multiple time by staff to in and out patient, unable to pass catheter.

## 2019-08-11 NOTE — Consult Note (Signed)
Urology Consult   Physician requesting consult: Dr Shanon Rosser  Reason for consult: Urinary retention, difficult foley  History of Present Illness: Philip [name] is a 86 y.o. male with history of prostate cancer s/p brachytherapy and urinary retention requiring CIC for past 20 years for whom urology is consulted to assist with catheter placement.   Patient reports that he had difficulty passing his 14 Fr catheter yesterday morning, was going to present to the ED, but then was subsequently able to cath. Performed catheterization with ease three more time during the day, but then again was unable to do so last night. Presents to the ED with urinary retention to 1L. ED staff unable to place a catheter after multiple attempts.   Patient does report prior history of requiring catheter scoped in. No recent infections.   Past Medical History:  Diagnosis Date   Aneurysm of left renal artery ([place])    per CT 08-20-2015 stable 66mm   Aortitis syndrome ([place]) rheumotologist-  dr Charlestine Night   IgG4 syndrome-- treatment prednisone- (effects abdomine)   Basal cell carcinoma    Bladder neck contracture    BPH (benign prostatic hyperplasia)    CKD (chronic kidney disease), stage III    Complication of anesthesia    post op acute urinary retention   DDD (degenerative disc disease)    Foley catheter in place    placed 02-29-2019 secondary to urinary retention post surgery   Full dentures    GERD (gastroesophageal reflux disease)    History of external beam radiation therapy 2002   prostate cancer  and boost with radioative prostate seed implants   History of prostate cancer DX  2002   S/P EXTERNAL RADIATION/ RADIOACTIVE SEED IMPLANTS  2003   History of squamous cell carcinoma excision    2012--  RIGHT LOWER EXTREM;  2019 LEFT LOWER EXTREMITIY   HTN (hypertension)    Hyperlipemia    Hypothyroidism    endocrinologist-  dr Chalmers Cater--- per pt takes his own thyroid med. called NP Thyroid 10  mg daily, does not take synthroid   Large vessel vasculitis (HCC)    aortitis--- rheumotologist-  dr Charlestine Night   Left renal artery stenosis ([place])    proximal per CT 08-20-2015 but patent   Left renal atrophy    Macular degeneration    Nocturia    OSA on CPAP    pt retested 11-13-2016 at [facility] w/ dr Dia Sitter-- moderate to severe OSA , AHI 16.4/h/  03-24-2017 per pt is consistant using every night   Pulmonary nodule    per CT 02-28-2017 in [place] in epic done at [place],  LUL nodule , not mets   Renal cyst, right    Restless legs syndrome (RLS) 10/31/2011   S/P radiation therapy     for tonsillar cancer (head and neck) completed 01-26-2012 at [place] (70Gy)   Saliva decreased    Sarcoma of lower extremity, right ([place]) oncolgoist-  dr Juluis Rainier ([facility])   dx 02-14-2017 w/ needle core bx;  high grade pleomorphic spindle cell sarcoma , grade 2 (cT2N0M0); 03-15-2017  radical resection sarcoma tumor right lower leg  and plan radiation   Self-catheterizes urinary bladder    QID   AND   PRN   Tonsillar cancer ([place]) 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+) (Stage IVA)---- dx oct 2013  ----s/p concurent chemo and radiation/  ended 01-26-2012-- no surgical intervention---  residuals ( dry mouth, decreased saliva)   oncologist  at [place]--  dr brizel--  HX OF -- NO RECURRENCE   Urethral stricture    chronic---  post urethral dilation's   Urinary retention with incomplete bladder emptying    Vitamin B 12 deficiency 01/28/2011   Vitamin D deficiency     Past Surgical History:  Procedure Laterality Date   BALLOON DILATION N/A 11/10/2014   Procedure: CYSTO BALLOON DILATION AND RETROGRADE URETHROGRAM ;  Surgeon: Bjorn Loser, MD;  Location: [facility];  Service: Urology;  Laterality: N/A;   CATARACT EXTRACTION W/ INTRAOCULAR LENS  IMPLANT, BILATERAL     CYSTO/ BALLOON DILATION OF  URETHRAL STRICTURE  12-25-2010    CYSTOSCOPY WITH RETROGRADE URETHROGRAM N/A 03/30/2016   Procedure: CYSTOSCOPY WITH RETROGRADE URETHROGRAM AND BALLOON DILATION with cystogram;  Surgeon: Bjorn Loser, MD;  [facility];  Service: Urology;  Laterality: N/A;   CYSTOSCOPY WITH URETHRAL DILATATION  05/31/2011   Procedure: CYSTOSCOPY WITH URETHRAL DILATATION;  Surgeon: Reece Packer, MD;  Location: [place];  Service: Urology;  Laterality: N/A;  BALLOON DILATION   CYSTOSCOPY WITH URETHRAL DILATATION N/A 09/13/2016   Procedure: CYSTOSCOPY WITH URETHRAL BALLOON DILATATION;  Surgeon: Bjorn Loser, MD;  Location: [place];  Service: Urology;  Laterality: N/A;   CYSTOSCOPY WITH URETHRAL DILATATION N/A 03/30/2017   Procedure: CYSTOSCOPY WITH BALLOON URETHRAL DILATATION;  Surgeon: Bjorn Loser, MD;  Location: [place];  Service: Urology;  Laterality: N/A;   CYSTOSCOPY/RETROGRADE/URETEROSCOPY N/A 05/22/2012   Procedure: CYSTOSCOPY BALLOON DILATION RETROGRADE URETEROGRAM ;  Surgeon: Reece Packer, MD;  Location: [place];  Service: Urology;  Laterality: N/A;   CYTSO/  DILATATION URETHRAL STRICTURE/  BX PROSTATIC URETHRA/  REMOVAL FOREIGN BODIES  06-25-2010   [facility]   INGUINAL HERNIA REPAIR Right 2000   MOHS SURGERY  01/ 2019    [facility]   left lower leg for [facility]   RADICAL RESECTION OF SARCOMA TUMOR   03-15-2017   [facility]   RIGHT LOWER LEG , CALF AREA   RADIOACTIVE SEED IMPLANTS, PROSTATE  JAN  2003   SKIN LESION EXCISION  07/2012   MOST  right shoulder    Medications:  Home meds:  No current facility-administered medications on file prior to encounter.   Current Outpatient Medications on File Prior to Encounter  Medication Sig Dispense Refill   buPROPion (WELLBUTRIN) 100 MG tablet Take 1 tablet by mouth twice daily 180 tablet 0   cholecalciferol (VITAMIN D) 1000 units tablet Take 2,000 Units by mouth daily.     Coenzyme  Q10 (COQ10) 100 MG CAPS Take 100 mg by mouth daily. Take one daily     cyanocobalamin (,VITAMIN B-12,) 1000 MCG/ML injection Inject 1 mL (1,000 mcg total) into the muscle every 30 (thirty) days. 10 mL 0   Cyanocobalamin-Methylcobalamin 600-600 MCG SUBL Place under the tongue daily.     DIGESTIVE ENZYMES PO Take 500 mg by mouth every morning.      fish oil-omega-3 fatty acids 1000 MG capsule Take 2 g by mouth daily. Name:  Natural Factors Omega-3     lisinopril (ZESTRIL) 10 MG tablet Take 1 tablet by mouth once daily 90 tablet 0   Multiple Vitamins-Minerals (ICAPS AREDS 2 PO) Take 2 tablets by mouth daily.     NP THYROID 90 MG tablet Take 90 mg by mouth daily.  5   predniSONE (DELTASONE) 5 MG tablet Take 5 mg by mouth daily with breakfast.     RESVERATROL PO  Take 100 mg by mouth daily.      TURMERIC CURCUMIN PO Take 2 tablets by mouth daily.      UNABLE TO FIND every other day. Mercie Eon take once daily - Vitamin E     UNABLE TO FIND [place] Bone take once daily     UNABLE TO FIND [place] take 4 tablets once daily     UNABLE TO FIND Comprehensive immune support take 2 tablets once daily     UNABLE TO FIND Modified citrus pectin take 4 tablets once daily     UNABLE TO FIND Celery seed take 2 tablets by mouth once daily       Scheduled Meds: Continuous Infusions: PRN Meds:.  Allergies:  Allergies  Allergen Reactions   Cefixime Rash   Contrast Media [Iodinated Diagnostic Agents] Other (See Comments)    Avoid due to renal disease   Macrobid [Nitrofurantoin Macrocrystal]    Nitrofurantoin Swelling   Nsaids Other (See Comments)    Has been told no NSAIDs, Ibuprofen Renal insufficiency hx.    Cephalosporins Rash    Family History  Problem Relation Age of Onset   Stroke Father     Social History:  reports that he has never smoked. He has never used smokeless tobacco. He reports current alcohol use of about 7.0 standard drinks of alcohol per week. He  reports that he does not use drugs.  ROS: A complete review of systems was performed.  All systems are negative except for pertinent findings as noted.  Physical Exam:  Vital signs in last 24 hours: Temp:  [97.9 F (36.6 C)] 97.9 F (36.6 C) (07/25 0432) Pulse Rate:  [83] 83 (07/25 0432) Resp:  [18] 18 (07/25 0432) BP: (198)/(106) 198/106 (07/25 0432) SpO2:  [95 %] 95 % (07/25 0432) Weight:  [71.7 kg] 71.7 kg (07/25 0432) Constitutional:  Alert and oriented, No acute distress Cardiovascular: Regular rate and rhythm, No JVD Respiratory: Normal respiratory effort, Lungs clear bilaterally GI: Abdomen is soft, nontender, nondistended, no abdominal masses Genitourinary: No CVAT. Circumcised normal male phallus, testes are descended bilaterally and non-tender and without masses, scrotum is normal in appearance without lesions or masses, perineum is normal on inspection. Lymphatic: No lymphadenopathy Neurologic: Grossly intact, no focal deficits Psychiatric: Normal mood and affect  Foley Catheter Placement Note  Pre-operative Diagnosis: Urinary retention  Post-operative Diagnosis: Same  Surgeon: Carmie Kanner, MD  Assistants: None  Procedure Details  Patient was placed in the supine position, prepped with Betadine and draped in the usual sterile fashion.  We injected lidocaine jelly per urethra prior to the procedure.  We then inserted a 18 [place] coude catheter per urethra which easily passed into the bladder without any resistance at the prostatic urethra.  We achieved return of clear yellow urine and then proceeded to insert 10 mL of sterile water into the Foley balloon.  The catheter was attached to a drainage bag and secured with a StatLock.  Placement of the catheter had return of greater than 1000 mL of clear urine.               Complications: None; patient tolerated the procedure well.  Impression/Recommendation 86 y.o. male with history of PCa, brachytherapy, CIC x 20  years with difficulty passing a catheter today. Likely secondary to small false passage. Was able to easily pass an 18Fr coude. Recommended continuing indwelling catheter for 4 days, and then resuming CIC. Patient feels comfortable removing catheter at home.   Altha Harm  Lucerito Rosinski 08/11/2019, 6:37 AM

## 2019-08-11 NOTE — ED Provider Notes (Signed)
[facility] DEPT Provider Note: Georgena Spurling, MD, FACEP  CSN: [id number] MRN: [id number] ARRIVAL: 08/11/19 at [place]: [id number]/WLPT1   CHIEF COMPLAINT  Urinary Retention   HISTORY OF PRESENT ILLNESS  08/11/19 5:14 AM Philip [name] is a 86 y.o. male who usually self catheterizes his bladder. He awoke this morning with a sensation of a full bladder but was unable to pass his usual catheter into his bladder. He arrives with mild to moderate discomfort in his bladder. He has had no fever, chills or back pain to suggest an infection. Bedside bladder scan shows greater than 999 mL of retained urine.   Past Medical History:  Diagnosis Date  . Aneurysm of left renal artery (HCC)    per CT 08-20-2015 stable 13mm  . Aortitis syndrome ([place]) rheumotologist-  dr Charlestine Night   IgG4 syndrome-- treatment prednisone- (effects abdomine)  . Basal cell carcinoma   . Bladder neck contracture   . BPH (benign prostatic hyperplasia)   . CKD (chronic kidney disease), stage III   . Complication of anesthesia    post op acute urinary retention  . DDD (degenerative disc disease)   . Foley catheter in place    placed 02-29-2019 secondary to urinary retention post surgery  . Full dentures   . GERD (gastroesophageal reflux disease)   . History of external beam radiation therapy 2002   prostate cancer  and boost with radioative prostate seed implants  . History of prostate cancer DX  2002   S/P EXTERNAL RADIATION/ RADIOACTIVE SEED IMPLANTS  2003  . History of squamous cell carcinoma excision    2012--  RIGHT LOWER EXTREM;  2019 LEFT LOWER EXTREMITIY  . HTN (hypertension)   . Hyperlipemia   . Hypothyroidism    endocrinologist-  dr Chalmers Cater--- per pt takes his own thyroid med. called NP Thyroid 10 mg daily, does not take synthroid  . Large vessel vasculitis (HCC)    aortitis--- rheumotologist-  dr Charlestine Night  . Left renal artery stenosis (HCC)    proximal per CT 08-20-2015 but patent  . Left renal atrophy    . Macular degeneration   . Nocturia   . OSA on CPAP    pt retested 11-13-2016 at [facility] w/ dr ather-- moderate to severe OSA , AHI 16.4/h/  03-24-2017 per pt is consistant using every night  . Pulmonary nodule    per CT 02-28-2017 in [place] in epic done at [facility],  LUL nodule , not mets  . Renal cyst, right   . Restless legs syndrome (RLS) 10/31/2011  . S/P radiation therapy     for tonsillar cancer (head and neck) completed 01-26-2012 at [facility])  . Saliva decreased   . Sarcoma of lower extremity, right ([place]) oncolgoist-  dr Juluis Rainier ([place])   dx 02-14-2017 w/ needle core bx;  high grade pleomorphic spindle cell sarcoma , grade 2 (cT2N0M0); 03-15-2017  radical resection sarcoma tumor right lower leg  and plan radiation  . Self-catheterizes urinary bladder    QID   AND   PRN  . 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 Urethral stricture    chronic---  post urethral dilation's  . Urinary retention with incomplete bladder emptying   .  Vitamin B 12 deficiency 01/28/2011  . Vitamin D deficiency     Past Surgical History:  Procedure Laterality Date  . BALLOON DILATION N/A 11/10/2014   Procedure: CYSTO BALLOON DILATION AND RETROGRADE URETHROGRAM ;  Surgeon: Bjorn Loser, MD;  Location[facility];  Service: Urology;  Laterality: N/A;  . CATARACT EXTRACTION W/ INTRAOCULAR LENS  IMPLANT, BILATERAL    . CYSTO/ BALLOON DILATION OF  URETHRAL STRICTURE  12-25-2010  . CYSTOSCOPY WITH RETROGRADE URETHROGRAM N/A 03/30/2016   Procedure: CYSTOSCOPY WITH RETROGRADE URETHROGRAM AND BALLOON DILATION with cystogram;  Surgeon: Bjorn Loser, MD;  Location[facility];  Service:  Urology;  Laterality: N/A;  . CYSTOSCOPY WITH URETHRAL DILATATION  05/31/2011   Procedure: CYSTOSCOPY WITH URETHRAL DILATATION;  Surgeon: Reece Packer, MD;  Location: [facility];  Service: Urology;  Laterality: N/A;  BALLOON DILATION  . CYSTOSCOPY WITH URETHRAL DILATATION N/A 09/13/2016   Procedure: CYSTOSCOPY WITH URETHRAL BALLOON DILATATION;  Surgeon: Bjorn Loser, MD;  Location: [place] [facility];  Service: Urology;  Laterality: N/A;  . CYSTOSCOPY WITH URETHRAL DILATATION N/A 03/30/2017   Procedure: CYSTOSCOPY WITH BALLOON URETHRAL DILATATION;  Surgeon: Bjorn Loser, MD;  Location: [place];  Service: Urology;  Laterality: N/A;  . CYSTOSCOPY/RETROGRADE/URETEROSCOPY N/A 05/22/2012   Procedure: CYSTOSCOPY BALLOON DILATION RETROGRADE URETEROGRAM ;  Surgeon: Reece Packer, MD;  Location: [place];  Service: Urology;  Laterality: N/A;  . CYTSO/  DILATATION URETHRAL STRICTURE/  BX PROSTATIC URETHRA/  REMOVAL FOREIGN BODIES  06-25-2010   [facility]  . INGUINAL HERNIA REPAIR Right 2000  . MOHS SURGERY  01/ 2019    [facility]   left lower leg for SCC  . RADICAL RESECTION OF SARCOMA TUMOR   03-15-2017   [facility]   RIGHT LOWER LEG , CALF AREA  . RADIOACTIVE SEED IMPLANTS, PROSTATE  JAN  2003  . SKIN LESION EXCISION  07/2012   MOST  right shoulder    Family History  Problem Relation Age of Onset  . Stroke Father     Social History   Tobacco Use  . Smoking status: Never Smoker  . Smokeless tobacco: Never Used  Vaping Use  . Vaping Use: Never used  Substance Use Topics  . Alcohol use: Yes    Alcohol/week: 7.0 standard drinks    Types: 7 Glasses of wine per week    Comment: 8oz, glass or red wine  . Drug use: No    Prior to Admission medications   Medication Sig Start Date End Date Taking? Authorizing Provider  buPROPion [facility]) 100 MG tablet Take 1 tablet by mouth twice daily 03/19/19   Reed, Tiffany L, DO  cholecalciferol  (VITAMIN D) 1000 units tablet Take 2,000 Units by mouth daily.    [provider]  Coenzyme Q10 (COQ10) 100 MG CAPS Take 100 mg by mouth daily. Take one daily    [provider]  cyanocobalamin (,VITAMIN B-12,) 1000 MCG/ML injection Inject 1 mL (1,000 mcg total) into the muscle every 30 (thirty) days. 06/14/19   Reed, Tiffany L, DO  Cyanocobalamin-Methylcobalamin 600-600 MCG SUBL Place under the tongue daily.    [provider]  DIGESTIVE ENZYMES PO Take 500 mg by mouth every morning.     [provider]  fish oil-omega-3 fatty acids 1000 MG capsule Take 2 g by mouth daily. Name:  Natural Factors Omega-3    [provider]  lisinopril (ZESTRIL) 10 MG tablet Take 1 tablet by  mouth once daily 03/19/19   Reed, Tiffany L, DO  Multiple Vitamins-Minerals (ICAPS AREDS 2 PO) Take 2 tablets by mouth daily.    [provider]  NP THYROID 90 MG tablet Take 90 mg by mouth daily. 06/14/17   [provider]  predniSONE (DELTASONE) 5 MG tablet Take 5 mg by mouth daily with breakfast.    [provider]  RESVERATROL PO Take 100 mg by mouth daily.     [provider]  TURMERIC CURCUMIN PO Take 2 tablets by mouth daily.     [provider]  UNABLE TO FIND every other day. Mercie Eon take once daily - Vitamin E    [provider]  UNABLE TO FIND [place] Bone take once daily    [provider]  UNABLE TO FIND [place] take 4 tablets once daily    [provider]  UNABLE TO FIND Comprehensive immune support take 2 tablets once daily    [provider]  UNABLE TO FIND Modified citrus pectin take 4 tablets once daily    [provider]  UNABLE TO FIND Celery seed take 2 tablets by mouth once daily    [provider]    Allergies Cefixime, Contrast media [iodinated diagnostic agents], Macrobid [nitrofurantoin macrocrystal], Nitrofurantoin, Nsaids, and  Cephalosporins   REVIEW OF SYSTEMS  Negative except as noted here or in the History of Present Illness.   PHYSICAL EXAMINATION  Initial Vital Signs Blood pressure (!) 198/106, pulse 83, temperature 97.9 F (36.6 C), temperature source Oral, resp. rate 18, height 5\' 11"  (1.803 m), weight 71.7 kg, SpO2 95 %.  Examination General: Well-developed, well-nourished male in no acute distress; appearance consistent with age of record HENT: normocephalic; atraumatic Eyes: pupils equal, round and reactive to light; extraocular muscles intact Neck: supple Heart: regular rate and rhythm Lungs: clear to auscultation bilaterally Abdomen: soft; nondistended; mild bladder tenderness; bowel sounds present GU: Tanner V male, uncircumcised Extremities: No deformity; full range of motion Neurologic: Awake, alert and oriented; motor function intact in all extremities and symmetric; no facial droop Skin: Warm and dry Psychiatric: Normal mood and affect   RESULTS  Summary of this visit's results, reviewed and interpreted by myself:   EKG Interpretation  Date/Time:    Ventricular Rate:    PR Interval:    QRS Duration:   QT Interval:    QTC Calculation:   R Axis:     Text Interpretation:        Laboratory Studies: No results found for this or any previous visit (from the past 24 hour(s)). Imaging Studies: No results found.  ED COURSE and MDM  Nursing notes, initial and subsequent vitals signs, including pulse oximetry, reviewed and interpreted by myself.  Vitals:   08/11/19 0432  BP: (!) 198/106  Pulse: 83  Resp: 18  Temp: 97.9 F (36.6 C)  TempSrc: Oral  SpO2: 95%  Weight: 71.7 kg  Height: 5\' 11"  (1.803 m)   Medications - No data to display  5:30 AM Consulted with Dr. Everlena Cooper of urology. She would like the urology cart at the patient's bedside.  6:40 AM Foley placed by Dr. Everlena Cooper.  She advised patient to leave it in for 3 days before he attempts to self  catheterize.  PROCEDURES  Procedures  Several attempts were made to catheterize the patient using a 16 French red rubber catheter as well as 16 [place], 18 [place] and 22 [place] coud catheters. These were attempted by nurse  Rowland Lathe as well as myself. We were unsuccessful in entering the bladder. The patient tolerated these attempts well and there were no immediate complications apart from a small amount of blood on the catheters.  ED DIAGNOSES     ICD-10-CM   1. Acute urinary retention  R33.8        Shanon Rosser, MD 08/11/19 [phone]

## 2019-08-13 ENCOUNTER — Encounter: Payer: Self-pay | Admitting: Internal Medicine

## 2019-08-13 DIAGNOSIS — C44311 Basal cell carcinoma of skin of nose: Secondary | ICD-10-CM | POA: Diagnosis not present

## 2019-08-13 DIAGNOSIS — L821 Other seborrheic keratosis: Secondary | ICD-10-CM | POA: Diagnosis not present

## 2019-08-13 DIAGNOSIS — D225 Melanocytic nevi of trunk: Secondary | ICD-10-CM | POA: Diagnosis not present

## 2019-08-13 DIAGNOSIS — L814 Other melanin hyperpigmentation: Secondary | ICD-10-CM | POA: Diagnosis not present

## 2019-08-13 DIAGNOSIS — D485 Neoplasm of uncertain behavior of skin: Secondary | ICD-10-CM | POA: Diagnosis not present

## 2019-08-13 DIAGNOSIS — Z85828 Personal history of other malignant neoplasm of skin: Secondary | ICD-10-CM | POA: Diagnosis not present

## 2019-08-13 DIAGNOSIS — L570 Actinic keratosis: Secondary | ICD-10-CM | POA: Diagnosis not present

## 2019-08-14 LAB — URINE CULTURE

## 2019-08-15 ENCOUNTER — Other Ambulatory Visit: Payer: Self-pay | Admitting: Urology

## 2019-08-15 DIAGNOSIS — R339 Retention of urine, unspecified: Secondary | ICD-10-CM | POA: Diagnosis not present

## 2019-08-15 DIAGNOSIS — N35012 Post-traumatic membranous urethral stricture: Secondary | ICD-10-CM | POA: Diagnosis not present

## 2019-08-15 DIAGNOSIS — R3915 Urgency of urination: Secondary | ICD-10-CM | POA: Diagnosis not present

## 2019-08-15 DIAGNOSIS — N3020 Other chronic cystitis without hematuria: Secondary | ICD-10-CM | POA: Diagnosis not present

## 2019-08-15 DIAGNOSIS — N365 Urethral false passage: Secondary | ICD-10-CM | POA: Diagnosis not present

## 2019-08-16 NOTE — Progress Notes (Signed)
Spoke with patient by phone and patient stated he had called dr Jeffie Pollock to reschedule 08-22-2019 surgery and he lives at [facility]

## 2019-08-20 ENCOUNTER — Encounter: Payer: Self-pay | Admitting: Internal Medicine

## 2019-08-21 ENCOUNTER — Telehealth: Payer: Self-pay

## 2019-08-21 NOTE — Telephone Encounter (Signed)
Negative for Rocky [place] spotted fever and negative for lime disease ... patient was notified

## 2019-09-04 ENCOUNTER — Encounter: Payer: Self-pay | Admitting: Internal Medicine

## 2019-09-04 ENCOUNTER — Non-Acute Institutional Stay: Payer: Medicare Other | Admitting: Internal Medicine

## 2019-09-04 ENCOUNTER — Other Ambulatory Visit: Payer: Self-pay

## 2019-09-04 VITALS — BP 158/82 | HR 76 | Temp 98.1°F | Ht 71.0 in | Wt 160.4 lb

## 2019-09-04 DIAGNOSIS — Z7189 Other specified counseling: Secondary | ICD-10-CM

## 2019-09-04 DIAGNOSIS — R5382 Chronic fatigue, unspecified: Secondary | ICD-10-CM

## 2019-09-04 DIAGNOSIS — E538 Deficiency of other specified B group vitamins: Secondary | ICD-10-CM

## 2019-09-04 MED ORDER — CYANOCOBALAMIN 1000 MCG/ML IJ SOLN: 1000.0000 ug | INTRAMUSCULAR | 3 refills | Status: DC

## 2019-09-04 NOTE — Progress Notes (Signed)
Location:  Occupational psychologist of Service:  Clinic (12)  Provider: Soma Lizak L. Mariea Clonts, D.O., C.M.D.  Code Status: DNR, MOST updated today and new to be uploaded to [email or web address] Goals of Care:  Advanced Directives 09/04/2019  Does Patient Have a Medical Advance Directive? -  Type of Advance Directive Out of facility DNR (pink MOST or yellow form)  Does patient want to make changes to medical advance directive? No - Patient declined  Copy of [place] in Chart? -  Would patient like information on creating a medical advance directive? -  Pre-existing out of facility DNR order (yellow form or pink MOST form) -     Chief Complaint  Patient presents with  . Medical Management of Chronic Issues    Filling out Most forms     HPI: Patient is a 86 y.o. male seen today for medical management of chronic diseases and advance care planning.    MOST:  DNR unless immediately witnessed arrest Medical interventions:  Comfort measures Abx:  Determine use if infection occurs IVFs:  No IVFs Feeding tube:  No feeding tube.  Patient -entered wishes/end of life planning: Experiences around illness and death:  He has not had any experiences around this.  Lost friends in the [employer] when he was young.  His first child died at 62 days old.  The mother had german measles and it was not known.  His father was the next one and he did not have a relationship with him.  His mother passed many many years later--had better relationship with her after he died.  It was sad, but he'd been in the [place] for many years and they just spoke occasionally and met if he went to [place].  Fears or worries about end of life:  Does not have.  Knows it is going to happen eventually.  If he were to die before his wife, he'd be concerned about her surviving without him.  They do have a great family.  He tears up.    He has the upcoming boat race here (cardboard boat competition).  He wants to stay  as healthy as he can and get fit again.    He's at [facility] and can move to higher level of care.  Get caregiver help.    Financially they are life plan community members.  If he could not speak or communicate, he would stop eating and drinking b/c he would not want to go on in that state.  If he could read or watch tv, he might want to keep going and get cared for by others.    During his last days, having his family close-by is most important to him.    Discussed low b12 levels.  He has taken 6 b12 injections w/o considerable improvement.  Last was end of July.  We will go up to every 2 weeks for 3 months and then recheck.    Past Medical History:  Diagnosis Date  . Aneurysm of left renal artery (HCC)    per CT 08-20-2015 stable 60m  . Aortitis syndrome ([place] rheumotologist-  dr tCharlestine Night  IgG4 syndrome-- treatment prednisone- (effects abdomine)  . Basal cell carcinoma   . Bladder neck contracture   . BPH (benign prostatic hyperplasia)   . CKD (chronic kidney disease), stage III   . Complication of anesthesia    post op acute urinary retention  . DDD (degenerative disc disease)   . Foley catheter in  place    placed 02-29-2019 secondary to urinary retention post surgery  . Full dentures   . GERD (gastroesophageal reflux disease)   . History of external beam radiation therapy 2002   prostate cancer  and boost with radioative prostate seed implants  . History of prostate cancer DX  2002   S/P EXTERNAL RADIATION/ RADIOACTIVE SEED IMPLANTS  2003  . History of squamous cell carcinoma excision    2012--  RIGHT LOWER EXTREM;  2019 LEFT LOWER EXTREMITIY  . HTN (hypertension)   . Hyperlipemia   . Hypothyroidism    endocrinologist-  dr Chalmers Cater--- per pt takes his own thyroid med. called NP Thyroid 10 mg daily, does not take synthroid  . Large vessel vasculitis (HCC)    aortitis--- rheumotologist-  dr Charlestine Night  . Left renal artery stenosis (HCC)    proximal per CT 08-20-2015 but  patent  . Left renal atrophy   . Macular degeneration   . Nocturia   . OSA on CPAP    pt retested 11-13-2016 at [facility] w/ dr ather-- moderate to severe OSA , AHI 16.4/h/  03-24-2017 per pt is consistant using every night  . Pulmonary nodule    per CT 02-28-2017 in [place] in epic done at [facility],  LUL nodule , not mets  . Renal cyst, right   . Restless legs syndrome (RLS) 10/31/2011  . S/P radiation therapy     for tonsillar cancer (head and neck) completed 01-26-2012 at [facility])  . Saliva decreased   . Sarcoma of lower extremity, right ([place]) oncolgoist-  dr Juluis Rainier ([place])   dx 02-14-2017 w/ needle core bx;  high grade pleomorphic spindle cell sarcoma , grade 2 (cT2N0M0); 03-15-2017  radical resection sarcoma tumor right lower leg  and plan radiation  . Self-catheterizes urinary bladder    QID   AND   PRN  . Tonsillar cancer ([place]) 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+) (Stage IVA)---- dx oct 2013  ----s/p concurent chemo and radiation/  ended 01-26-2012-- no surgical intervention---  residuals ( dry mouth, decreased saliva)   oncologist at [place]--  dr brizel--  HX OF -- NO RECURRENCE  . Urethral stricture    chronic---  post urethral dilation's  . Urinary retention with incomplete bladder emptying   . Vitamin B 12 deficiency 01/28/2011  . Vitamin D deficiency     Past Surgical History:  Procedure Laterality Date  . BALLOON DILATION N/A 11/10/2014   Procedure: CYSTO BALLOON DILATION AND RETROGRADE URETHROGRAM ;  Surgeon: Bjorn Loser, MD;  [facility];  Service: Urology;  Laterality: N/A;  . CATARACT EXTRACTION W/ INTRAOCULAR LENS  IMPLANT, BILATERAL    . CYSTO/ BALLOON DILATION OF  URETHRAL STRICTURE  12-25-2010  . CYSTOSCOPY WITH RETROGRADE URETHROGRAM N/A 03/30/2016   Procedure: CYSTOSCOPY WITH RETROGRADE URETHROGRAM AND BALLOON DILATION with cystogram;  Surgeon: Bjorn Loser, MD;  Location[facility];  Service: Urology;  Laterality: N/A;  . CYSTOSCOPY WITH URETHRAL DILATATION  05/31/2011   Procedure: CYSTOSCOPY WITH URETHRAL DILATATION;  Surgeon: Reece Packer, MD;  Location: [place];  Service: Urology;  Laterality: N/A;  BALLOON DILATION  . CYSTOSCOPY WITH URETHRAL DILATATION N/A 09/13/2016   Procedure: CYSTOSCOPY WITH URETHRAL BALLOON DILATATION;  Surgeon: Bjorn Loser, MD;  Location: [place];  Service: Urology;  Laterality: N/A;  . CYSTOSCOPY WITH URETHRAL DILATATION N/A 03/30/2017   Procedure: CYSTOSCOPY WITH BALLOON URETHRAL DILATATION;  Surgeon: Bjorn Loser, MD;  [facility];  Service: Urology;  Laterality: N/A;  . CYSTOSCOPY/RETROGRADE/URETEROSCOPY N/A 05/22/2012   Procedure: CYSTOSCOPY BALLOON DILATION RETROGRADE URETEROGRAM ;  Surgeon: Reece Packer, MD;  Location: [place];  Service: Urology;  Laterality: N/A;  . CYTSO/  DILATATION URETHRAL STRICTURE/  BX PROSTATIC URETHRA/  REMOVAL FOREIGN BODIES  06-25-2010   [facility]  . INGUINAL HERNIA REPAIR Right 2000  . MOHS SURGERY  01/ 2019    [facility]   left lower leg for SCC  . RADICAL RESECTION OF SARCOMA TUMOR   03-15-2017   [facility]   RIGHT LOWER LEG , CALF AREA  . RADIOACTIVE SEED IMPLANTS, PROSTATE  JAN  2003  . SKIN LESION EXCISION  07/2012   MOST  right shoulder    Allergies  Allergen Reactions  . Cefixime Rash  . Contrast Media [Iodinated Diagnostic Agents] Other (See Comments)    Avoid due to renal disease  . Macrobid [Nitrofurantoin Macrocrystal]   . Nitrofurantoin Swelling  . Nsaids Other (See Comments)    Has been told no NSAIDs, Ibuprofen Renal insufficiency hx.   . Cephalosporins Rash    Outpatient Encounter Medications as of 09/04/2019  Medication Sig  . buPROPion (WELLBUTRIN) 100 MG tablet Take 1 tablet by mouth twice daily  . cholecalciferol (VITAMIN D) 1000 units tablet Take 2,000 Units by mouth daily.    . Coenzyme Q10 (COQ10) 100 MG CAPS Take 100 mg by mouth daily. Take one daily  . cyanocobalamin (,VITAMIN B-12,) 1000 MCG/ML injection Inject 1 mL (1,000 mcg total) into the muscle every 30 (thirty) days.  . Cyanocobalamin-Methylcobalamin 600-600 MCG SUBL Place under the tongue daily.  Marland Kitchen DIGESTIVE ENZYMES PO Take 500 mg by mouth every morning.   . fish oil-omega-3 fatty acids 1000 MG capsule Take 2 g by mouth daily. Name:  Natural Factors Omega-3  . lisinopril (ZESTRIL) 10 MG tablet Take 1 tablet by mouth once daily  . Multiple Vitamins-Minerals (ICAPS AREDS 2 PO) Take 2 tablets by mouth daily.  . NP THYROID 90 MG tablet Take 90 mg by mouth daily.  Marland Kitchen RESVERATROL PO Take 100 mg by mouth daily.   . TURMERIC CURCUMIN PO Take 2 tablets by mouth daily.   Marland Kitchen UNABLE TO FIND every other day. Mercie Eon take once daily - Vitamin E  . UNABLE TO FIND [place] Bone take once daily  . UNABLE TO FIND [place] take 4 tablets once daily  . UNABLE TO FIND Comprehensive immune support take 2 tablets once daily  . UNABLE TO FIND Modified citrus pectin take 4 tablets once daily  . UNABLE TO FIND Celery seed take 2 tablets by mouth once daily  . [DISCONTINUED] predniSONE (DELTASONE) 5 MG tablet Take 5 mg by mouth daily with breakfast.   No facility-administered encounter medications on file as of 09/04/2019.    Review of Systems:  Review of Systems  Constitutional: Positive for malaise/fatigue. Negative for chills and fever.  Respiratory: Negative for cough and shortness of breath.   Cardiovascular: Positive for leg swelling. Negative for chest pain and palpitations.       Left lymphedema  Gastrointestinal: Negative for abdominal pain.  Genitourinary: Negative for dysuria.  Musculoskeletal: Negative for falls and joint pain.  Neurological: Positive for weakness. Negative for loss of consciousness.  Psychiatric/Behavioral: Negative for depression. The patient is not nervous/anxious and does not have  insomnia.     Health Maintenance  Topic Date Due  .  INFLUENZA VACCINE  08/18/2019  . TETANUS/TDAP  08/06/2020 (Originally 01/17/2013)  . COVID-19 Vaccine  Completed  . PNA vac Low Risk Adult  Completed    Physical Exam: Vitals:   09/04/19 0919  BP: (!) 158/82  Pulse: 76  Temp: 98.1 F (36.7 C)  TempSrc: Temporal  SpO2: 95%  Weight: 160 lb 6.4 oz (72.8 kg)  Height: 5' 11"  (1.803 m)   Body mass index is 22.37 kg/m. Physical Exam Vitals reviewed.  Constitutional:      General: He is not in acute distress.    Appearance: Normal appearance. He is not toxic-appearing.  HENT:     Head: Normocephalic and atraumatic.  Pulmonary:     Effort: Pulmonary effort is normal.  Musculoskeletal:        General: Normal range of motion.  Neurological:     General: No focal deficit present.     Mental Status: He is alert and oriented to person, place, and time.     Motor: No weakness.     Gait: Gait normal.  Psychiatric:        Mood and Affect: Mood normal.        Behavior: Behavior normal.     Labs reviewed: Basic Metabolic Panel: Recent Labs    01/31/19 0800  NA 141  K 4.7  CL 102  CO2 30*  BUN 25*  CREATININE 1.5*  CALCIUM 9.2  TSH 4.21   Liver Function Tests: Recent Labs    01/31/19 0800  ALBUMIN 4.2   No results for input(s): LIPASE, AMYLASE in the last 8760 hours. No results for input(s): AMMONIA in the last 8760 hours. CBC: Recent Labs    01/31/19 0000  WBC 6.2  HGB 14.0  HCT 41  PLT 233   Lipid Panel: No results for input(s): CHOL, HDL, LDLCALC, TRIG, CHOLHDL, LDLDIRECT in the last 8760 hours. No results found for: HGBA1C  Procedures since last visit: No results found.  Assessment/Plan 1. ACP (advance care planning) -30 mins spent today updating his MOST form--he requested changes after giving this more thought over the past few years -also completed some other goals but the system did not allow me to put them in the proper part of the chart so  in my note above   2. Chronic fatigue -ongoing issue -B12 remains only in 300s after several b12 injections over the past 6 mos -he will increase to 1000cg IM every 2 wks for 3 mos, recheck level and then have appt with me--this should not be changed -also due to advancing age, autoimmune disease, multiple cancer histories and radiation, sleep apnea  3. B12 deficiency -contributory to his fatigue  Labs/tests ordered:  b12 level in 3 mos, then appt Next appt:  12/11/2019 (10/27 appt canceled for him and his wife)  Donta Fuster L. Marenda Accardi, D.O. [facility] [address], [place] [address] Cell Phone (Mon-Fri 8am-5pm):  [phone] On Call:  [phone] & follow prompts after 5pm & weekends Office Phone:  [phone] Office Fax:  [phone]

## 2019-09-06 ENCOUNTER — Encounter (HOSPITAL_BASED_OUTPATIENT_CLINIC_OR_DEPARTMENT_OTHER): Payer: Self-pay | Admitting: Urology

## 2019-09-06 ENCOUNTER — Other Ambulatory Visit: Payer: Self-pay

## 2019-09-06 NOTE — Progress Notes (Addendum)
ADDENDUM:  Received pt's lov w/ Dr [name], placed with chart.  And pt has not seen Dr Dossie Der since 09-06-2018, copy of note w/ chart. Chart reviewed by anesthesia, Konrad Felix PA, pt stable and meets guidelines.   Spoke w/ via phone for pre-op interview--- PT Lab needs dos----Istat              Lab results------ current ekg (epic/ chart) and chest CT (care everywhere/ chart) COVID test ------ 09-10-2019 @ 1000 Arrive at ------- 1030 NPO after MN NO Solid Food.  Clear liquids from MN until--- 0930 Medications to take morning of surgery ----- Wellbutrin, NPThyroid w/ sips of water Diabetic medication ----- n/a Patient Special Instructions ----- n/a Pre-Op special Istructions ----- n/a Patient verbalized understanding of instructions that were given at this phone interview. Patient denies shortness of breath, chest pain, fever, cough at this phone interview.   Anesthesia Review:  Hx HTN;  Large vessel vasculitis related aoritis; hx Tonsillar cancer; hx Prostate cancer;  Right lower leg Sarcoma; CKD3; OSA (last used cpap 2019).  Pt denies sob with any activity.  Chart to be reviewed by anesthesia, Konrad Felix PA.  PCP:  Dr Hollace Kinnier (lov 09-04-2019 epic)   Cardiologist : evaluation by Dr Fransico Michael for fatique Cassell Clement 05-21-2019 epic)  Rheumatologist:  Dr Dossie Der (lov per pt 6 months ago, request note)  Nephrologist:  Dr Laurena Bering (lov per pt 06-10-2019, request note)  Oncology:  Dr Juluis Rainier (lov 02-26-2019), Dr Cherre Blanc Cassell Clement 06-11-2019) both care everywhere  Chest x-ray : 02-26-2019 care everywhere Chest CT:  06-11-2019 care everywhere EKG : 04-26-2019 epic Echo : 05-06-2019 epic Stress test:  No Event monitor:  05-28-2019 epic Cardiac Cath :  no Activity level:  See above Sleep Study/ CPAP : YES/ No Fasting Blood Sugar :      / Checks Blood Sugar -- times a day:  N/A Blood Thinner/ Instructions Maryjane Hurter Dose: NO ASA / Instructions/ Last Dose : NO

## 2019-09-10 ENCOUNTER — Other Ambulatory Visit (HOSPITAL_COMMUNITY)
Admission: RE | Admit: 2019-09-10 | Discharge: 2019-09-10 | Disposition: A | Discharge status: Home / Self Care | Payer: Medicare Other | Source: Ambulatory Visit | Attending: Urology | Admitting: Urology

## 2019-09-10 DIAGNOSIS — Z01812 Encounter for preprocedural laboratory examination: Secondary | ICD-10-CM | POA: Diagnosis not present

## 2019-09-10 DIAGNOSIS — Z20822 Contact with and (suspected) exposure to covid-19: Secondary | ICD-10-CM | POA: Insufficient documentation

## 2019-09-10 LAB — SARS CORONAVIRUS 2 (TAT 6-24 HRS): SARS Coronavirus 2: NEGATIVE

## 2019-09-11 DIAGNOSIS — C770 Secondary and unspecified malignant neoplasm of lymph nodes of head, face and neck: Secondary | ICD-10-CM | POA: Diagnosis not present

## 2019-09-11 DIAGNOSIS — C099 Malignant neoplasm of tonsil, unspecified: Secondary | ICD-10-CM | POA: Diagnosis not present

## 2019-09-11 DIAGNOSIS — Z85818 Personal history of malignant neoplasm of other sites of lip, oral cavity, and pharynx: Secondary | ICD-10-CM | POA: Diagnosis not present

## 2019-09-11 DIAGNOSIS — R2241 Localized swelling, mass and lump, right lower limb: Secondary | ICD-10-CM | POA: Diagnosis not present

## 2019-09-11 DIAGNOSIS — C4921 Malignant neoplasm of connective and soft tissue of right lower limb, including hip: Secondary | ICD-10-CM | POA: Diagnosis not present

## 2019-09-11 DIAGNOSIS — R918 Other nonspecific abnormal finding of lung field: Secondary | ICD-10-CM | POA: Diagnosis not present

## 2019-09-11 DIAGNOSIS — Z8546 Personal history of malignant neoplasm of prostate: Secondary | ICD-10-CM | POA: Diagnosis not present

## 2019-09-11 DIAGNOSIS — Z08 Encounter for follow-up examination after completed treatment for malignant neoplasm: Secondary | ICD-10-CM | POA: Diagnosis not present

## 2019-09-11 DIAGNOSIS — I890 Lymphedema, not elsewhere classified: Secondary | ICD-10-CM | POA: Diagnosis not present

## 2019-09-11 DIAGNOSIS — Z8589 Personal history of malignant neoplasm of other organs and systems: Secondary | ICD-10-CM | POA: Diagnosis not present

## 2019-09-11 DIAGNOSIS — Z923 Personal history of irradiation: Secondary | ICD-10-CM | POA: Diagnosis not present

## 2019-09-11 DIAGNOSIS — E039 Hypothyroidism, unspecified: Secondary | ICD-10-CM | POA: Diagnosis not present

## 2019-09-11 DIAGNOSIS — M19011 Primary osteoarthritis, right shoulder: Secondary | ICD-10-CM | POA: Diagnosis not present

## 2019-09-12 ENCOUNTER — Ambulatory Visit (HOSPITAL_BASED_OUTPATIENT_CLINIC_OR_DEPARTMENT_OTHER)
Admission: RE | Admit: 2019-09-12 | Discharge: 2019-09-12 | Disposition: A | Discharge status: Home / Self Care | Payer: Medicare Other | Attending: Urology | Admitting: Urology

## 2019-09-12 ENCOUNTER — Ambulatory Visit (HOSPITAL_BASED_OUTPATIENT_CLINIC_OR_DEPARTMENT_OTHER): Payer: Medicare Other | Admitting: Physician Assistant

## 2019-09-12 ENCOUNTER — Encounter (HOSPITAL_BASED_OUTPATIENT_CLINIC_OR_DEPARTMENT_OTHER): Payer: Self-pay | Admitting: Urology

## 2019-09-12 ENCOUNTER — Encounter (HOSPITAL_BASED_OUTPATIENT_CLINIC_OR_DEPARTMENT_OTHER)
Admission: RE | Disposition: A | Discharge status: Home / Self Care | Payer: Self-pay | Source: Home / Self Care | Attending: Urology

## 2019-09-12 DIAGNOSIS — I129 Hypertensive chronic kidney disease with stage 1 through stage 4 chronic kidney disease, or unspecified chronic kidney disease: Secondary | ICD-10-CM | POA: Insufficient documentation

## 2019-09-12 DIAGNOSIS — N183 Chronic kidney disease, stage 3 unspecified: Secondary | ICD-10-CM | POA: Insufficient documentation

## 2019-09-12 DIAGNOSIS — Z85818 Personal history of malignant neoplasm of other sites of lip, oral cavity, and pharynx: Secondary | ICD-10-CM | POA: Insufficient documentation

## 2019-09-12 DIAGNOSIS — N35012 Post-traumatic membranous urethral stricture: Secondary | ICD-10-CM | POA: Diagnosis not present

## 2019-09-12 DIAGNOSIS — Z8546 Personal history of malignant neoplasm of prostate: Secondary | ICD-10-CM | POA: Diagnosis not present

## 2019-09-12 DIAGNOSIS — N35912 Unspecified bulbous urethral stricture, male: Secondary | ICD-10-CM | POA: Insufficient documentation

## 2019-09-12 DIAGNOSIS — N3000 Acute cystitis without hematuria: Secondary | ICD-10-CM | POA: Diagnosis not present

## 2019-09-12 DIAGNOSIS — N3020 Other chronic cystitis without hematuria: Secondary | ICD-10-CM | POA: Diagnosis not present

## 2019-09-12 DIAGNOSIS — Z923 Personal history of irradiation: Secondary | ICD-10-CM | POA: Insufficient documentation

## 2019-09-12 DIAGNOSIS — N429 Disorder of prostate, unspecified: Secondary | ICD-10-CM | POA: Diagnosis not present

## 2019-09-12 DIAGNOSIS — Z85828 Personal history of other malignant neoplasm of skin: Secondary | ICD-10-CM | POA: Insufficient documentation

## 2019-09-12 DIAGNOSIS — G4733 Obstructive sleep apnea (adult) (pediatric): Secondary | ICD-10-CM | POA: Insufficient documentation

## 2019-09-12 DIAGNOSIS — F329 Major depressive disorder, single episode, unspecified: Secondary | ICD-10-CM | POA: Diagnosis not present

## 2019-09-12 DIAGNOSIS — E785 Hyperlipidemia, unspecified: Secondary | ICD-10-CM | POA: Diagnosis not present

## 2019-09-12 DIAGNOSIS — Z20822 Contact with and (suspected) exposure to covid-19: Secondary | ICD-10-CM | POA: Diagnosis not present

## 2019-09-12 DIAGNOSIS — N365 Urethral false passage: Secondary | ICD-10-CM | POA: Diagnosis not present

## 2019-09-12 DIAGNOSIS — Z79890 Hormone replacement therapy (postmenopausal): Secondary | ICD-10-CM | POA: Diagnosis not present

## 2019-09-12 DIAGNOSIS — Z79899 Other long term (current) drug therapy: Secondary | ICD-10-CM | POA: Insufficient documentation

## 2019-09-12 DIAGNOSIS — G2581 Restless legs syndrome: Secondary | ICD-10-CM | POA: Insufficient documentation

## 2019-09-12 DIAGNOSIS — K219 Gastro-esophageal reflux disease without esophagitis: Secondary | ICD-10-CM | POA: Diagnosis not present

## 2019-09-12 DIAGNOSIS — E039 Hypothyroidism, unspecified: Secondary | ICD-10-CM | POA: Insufficient documentation

## 2019-09-12 HISTORY — DX: Hyperlipidemia, unspecified: E78.5

## 2019-09-12 HISTORY — DX: Personal history of other malignant neoplasm of skin: Z85.828

## 2019-09-12 HISTORY — DX: Other fatigue: R53.83

## 2019-09-12 HISTORY — DX: Nonexudative age-related macular degeneration, bilateral, stage unspecified: H35.3130

## 2019-09-12 HISTORY — PX: CYSTOSCOPY WITH URETHRAL DILATATION: SHX5125

## 2019-09-12 HISTORY — DX: Personal history of other malignant neoplasm of skin: Z98.890

## 2019-09-12 HISTORY — PX: TRANSURETHRAL INCISION OF BLADDER NECK: SHX6152

## 2019-09-12 HISTORY — DX: Lymphedema, not elsewhere classified: I89.0

## 2019-09-12 LAB — POCT I-STAT, CHEM 8
BUN: 21 mg/dL (ref 8–23)
Calcium, Ion: 1.18 mmol/L (ref 1.15–1.40)
Chloride: 103 mmol/L (ref 98–111)
Creatinine, Ser: 1.4 mg/dL — ABNORMAL HIGH (ref 0.61–1.24)
Glucose, Bld: 102 mg/dL — ABNORMAL HIGH (ref 70–99)
HCT: 42 % (ref 39.0–52.0)
Hemoglobin: 14.3 g/dL (ref 13.0–17.0)
Potassium: 4.2 mmol/L (ref 3.5–5.1)
Sodium: 142 mmol/L (ref 135–145)
TCO2: 25 mmol/L (ref 22–32)

## 2019-09-12 LAB — SARS CORONAVIRUS 2 BY RT PCR (HOSPITAL ORDER, PERFORMED IN ~~LOC~~ HOSPITAL LAB): SARS Coronavirus 2: NEGATIVE

## 2019-09-12 SURGERY — INCISION, BLADDER NECK, TRANSURETHRAL
Anesthesia: General | Site: Renal

## 2019-09-12 MED ORDER — PHENYLEPHRINE 40 MCG/ML (10ML) SYRINGE FOR IV PUSH (FOR BLOOD PRESSURE SUPPORT)
PREFILLED_SYRINGE | INTRAVENOUS | Status: DC | PRN
  Administered 2019-09-12 (×5): 80 ug via INTRAVENOUS

## 2019-09-12 MED ORDER — LIDOCAINE 2% (20 MG/ML) 5 ML SYRINGE
INTRAMUSCULAR | Status: AC
  Filled 2019-09-12: qty 5

## 2019-09-12 MED ORDER — LIDOCAINE 2% (20 MG/ML) 5 ML SYRINGE
INTRAMUSCULAR | Status: DC | PRN
  Administered 2019-09-12: 80 mg via INTRAVENOUS

## 2019-09-12 MED ORDER — PROPOFOL 10 MG/ML IV BOLUS
INTRAVENOUS | Status: AC
  Filled 2019-09-12: qty 20

## 2019-09-12 MED ORDER — SODIUM CHLORIDE 0.9% FLUSH: 3.0000 mL | Freq: Two times a day (BID) | INTRAVENOUS | Status: DC

## 2019-09-12 MED ORDER — PROPOFOL 10 MG/ML IV BOLUS
INTRAVENOUS | Status: DC | PRN
  Administered 2019-09-12: 140 mg via INTRAVENOUS

## 2019-09-12 MED ORDER — CIPROFLOXACIN HCL 250 MG PO TABS: 250.0000 mg | ORAL_TABLET | Freq: Two times a day (BID) | ORAL | 0 refills | Status: AC

## 2019-09-12 MED ORDER — CIPROFLOXACIN IN D5W 400 MG/200ML IV SOLN
INTRAVENOUS | Status: AC
  Filled 2019-09-12: qty 200

## 2019-09-12 MED ORDER — FENTANYL CITRATE (PF) 100 MCG/2ML IJ SOLN
INTRAMUSCULAR | Status: AC
  Filled 2019-09-12: qty 2

## 2019-09-12 MED ORDER — FENTANYL CITRATE (PF) 100 MCG/2ML IJ SOLN
INTRAMUSCULAR | Status: DC | PRN
  Administered 2019-09-12: 50 ug via INTRAVENOUS

## 2019-09-12 MED ORDER — SODIUM CHLORIDE 0.9 % IV SOLN: INTRAVENOUS | Status: DC

## 2019-09-12 MED ORDER — ONDANSETRON HCL 4 MG/2ML IJ SOLN
INTRAMUSCULAR | Status: AC
  Filled 2019-09-12: qty 2

## 2019-09-12 MED ORDER — EPHEDRINE SULFATE-NACL 50-0.9 MG/10ML-% IV SOSY
PREFILLED_SYRINGE | INTRAVENOUS | Status: DC | PRN
  Administered 2019-09-12: 5 mg via INTRAVENOUS
  Administered 2019-09-12 (×4): 10 mg via INTRAVENOUS
  Administered 2019-09-12: 5 mg via INTRAVENOUS

## 2019-09-12 MED ORDER — SODIUM CHLORIDE 0.9 % IR SOLN
Status: DC | PRN
  Administered 2019-09-12: 3000 mL via INTRAVESICAL

## 2019-09-12 MED ORDER — ONDANSETRON HCL 4 MG/2ML IJ SOLN
INTRAMUSCULAR | Status: DC | PRN
  Administered 2019-09-12: 4 mg via INTRAVENOUS

## 2019-09-12 MED ORDER — CIPROFLOXACIN IN D5W 400 MG/200ML IV SOLN
400.0000 mg | INTRAVENOUS | Status: AC
  Administered 2019-09-12: 400 mg via INTRAVENOUS

## 2019-09-12 SURGICAL SUPPLY — 34 items
BAG DRAIN URO-CYSTO SKYTR STRL (DRAIN) ×4 IMPLANT
BAG DRN UROCATH (DRAIN) ×2
BAG URINE LEG 500ML (DRAIN) ×4 IMPLANT
BALLN NEPHROSTOMY (BALLOONS)
BALLOON NEPHROSTOMY (BALLOONS) IMPLANT
CATH FOLEY 2WAY SLVR  5CC 16FR (CATHETERS)
CATH FOLEY 2WAY SLVR  5CC 22FR (CATHETERS) ×2
CATH FOLEY 2WAY SLVR 5CC 16FR (CATHETERS) IMPLANT
CATH FOLEY 2WAY SLVR 5CC 22FR (CATHETERS) ×2 IMPLANT
CLOTH BEACON ORANGE TIMEOUT ST (SAFETY) ×4 IMPLANT
ELECT REM PT RETURN 9FT ADLT (ELECTROSURGICAL) ×4
ELECTRODE REM PT RTRN 9FT ADLT (ELECTROSURGICAL) ×2 IMPLANT
GLOVE BIO SURGEON STRL SZ7 (GLOVE) ×4 IMPLANT
GLOVE BIOGEL PI IND STRL 6.5 (GLOVE) ×2 IMPLANT
GLOVE BIOGEL PI INDICATOR 6.5 (GLOVE) ×2
GLOVE ECLIPSE 6.5 STRL STRAW (GLOVE) ×4 IMPLANT
GLOVE SURG SS PI 8.0 STRL IVOR (GLOVE) ×4 IMPLANT
GOWN STRL REUS W/ TWL LRG LVL3 (GOWN DISPOSABLE) ×2 IMPLANT
GOWN STRL REUS W/TWL LRG LVL3 (GOWN DISPOSABLE) ×8 IMPLANT
GOWN STRL REUS W/TWL XL LVL3 (GOWN DISPOSABLE) ×4 IMPLANT
GUIDEWIRE STR DUAL SENSOR (WIRE) IMPLANT
KIT TURNOVER CYSTO (KITS) ×4 IMPLANT
LOOP CUT BIPOLAR 24F LRG (ELECTROSURGICAL) ×4 IMPLANT
MANIFOLD NEPTUNE II (INSTRUMENTS) ×4 IMPLANT
NDL SAFETY ECLIPSE 18X1.5 (NEEDLE) IMPLANT
NEEDLE HYPO 18GX1.5 SHARP (NEEDLE)
NEEDLE HYPO 22GX1.5 SAFETY (NEEDLE) IMPLANT
NS IRRIG 500ML POUR BTL (IV SOLUTION) IMPLANT
PACK CYSTO (CUSTOM PROCEDURE TRAY) ×4 IMPLANT
SYR 20ML LL LF (SYRINGE) IMPLANT
SYR 30ML LL (SYRINGE) IMPLANT
TUBE CONNECTING 12'X1/4 (SUCTIONS)
TUBE CONNECTING 12X1/4 (SUCTIONS) IMPLANT
WATER STERILE IRR 3000ML UROMA (IV SOLUTION) ×4 IMPLANT

## 2019-09-12 NOTE — Anesthesia Postprocedure Evaluation (Signed)
Anesthesia Post Note  Patient: [name]  Procedure(s) Performed: TRANSURETHRAL INCISION OF BLADDER NECK (N/A Bladder) CYSTOSCOPY WITH URETHRAL DILATATION (N/A Renal)     Patient location during evaluation: PACU Anesthesia Type: General Level of consciousness: awake and alert Pain management: pain level controlled Vital Signs Assessment: post-procedure vital signs reviewed and stable Respiratory status: spontaneous breathing, nonlabored ventilation, respiratory function stable and patient connected to nasal cannula oxygen Cardiovascular status: blood pressure returned to baseline and stable Postop Assessment: no apparent nausea or vomiting Anesthetic complications: no   No complications documented.  Last Vitals:  Vitals:   09/12/19 1245 09/12/19 1250  BP:  135/77  Pulse:  68  Resp:  12  Temp: (!) 36.4 C (!) 36.4 C  SpO2:  100%    Last Pain:  Vitals:   09/12/19 1330  TempSrc:   PainSc: 0-No pain                 Ortencia Askari DAVID

## 2019-09-12 NOTE — Progress Notes (Signed)
Patient left voicemail message he broke quarantine and needs rapid covid test spoke with patient he will leave now for rapid covid test and will be in cream colored infinity car. Ok for rapid per [employer]

## 2019-09-12 NOTE — H&P (Signed)
Subjective: CC: Urethral false passage with difficult catheter passage.   [name] is on chronic CIC and has had some difficulty passing the catheter recently.  He was round on cystoscopy to have a posterior false passage just distal to the bladder neck.  He is to undergo bladder neck incision to eliminate the false passage.  ROS:  Review of Systems  All other systems reviewed and are negative.   Allergies  Allergen Reactions  . Cefixime Rash  . Contrast Media [Iodinated Diagnostic Agents] Other (See Comments)    Avoid due to renal disease  . Macrobid [Nitrofurantoin Macrocrystal] Swelling  . Nsaids Other (See Comments)    Has been told no NSAIDs, Ibuprofen Renal insufficiency hx.   . Cephalosporins Rash    Past Medical History:  Diagnosis Date  . Age-related macular degeneration, dry, both eyes   . 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 CKD (chronic kidney disease), stage III    nephrologist-- dr Johnette Abraham. [name]  . Complication of anesthesia    post op acute urinary retention  . DDD (degenerative disc disease)   . Fatigue    pt evalulation by cardiology--- dr Fransico Michael, note 05-21-2019 in epic,(non cardiac)  normal echo and event monitor showed extra beats of atrial tachycardia , no atrial fib, and rare ecotpy  . Full dentures   . GERD (gastroesophageal reflux disease)   . History of basal cell carcinoma (BCC) excision    12-25-2018 MOH's reconstruction left lower eyelid  . History of external beam radiation therapy 2002   prostate cancer  and boost with radioative prostate seed implants  . History of prostate cancer DX  2002   S/P EXTERNAL RADIATION/ RADIOACTIVE SEED IMPLANTS  2003  . History of squamous cell carcinoma excision    2012--  RIGHT LOWER EXTREM;  2019 LEFT LOWER EXTREMITIY  . HTN (hypertension)    followed by pcp  . Hyperlipidemia   .  Hypothyroidism    followed by pcp--- acquired atrophy due to radiation;  previously seen by endocrinologist-  dr balan--- per pt takes his own thyroid med. called NP Thyroid 90 mg daily, does not take synthroid  . Large vessel vasculitis ([name])    rheumotologist-  dr syed --- IgG4 related aoritis involving renal arteries ,  pt treated with predisone.  (09-06-2019  per pt no longer prednisone last taken one month ago 07/ 2021, weaned off by pcp per pt request)  . Left renal artery stenosis [name])    nephrologist--- dr [name]--- with aneurysm proximal   . Lymphedema of left leg    followed by [name]---- pt stated uses compression hose  . OSA (obstructive sleep apnea) 09-06-2019  per pt lasted used cpap approx. 2019, stated felt he no longer needed    pt retested 11-13-2016 at [name] w/ dr ather-- moderate to severe OSA , AHI 16.4/h/  03-24-2017   . Pulmonary nodule followed by [name]   per CT 06-11-2019  in [place] in epic done at [name],  LUL nodule , not mets  . Renal cyst, right   . Restless legs syndrome (RLS)   . S/P radiation therapy     for tonsillar cancer (head and neck) completed 01-26-2012 at [name]);   and XRT for right lower leg sarcoma --- completed 06-06-2017  . Saliva decreased   . Sarcoma of  lower extremity, right ([name]) oncolgoist-  dr Juluis Rainier ([name]) & Dr Viona Gilmore. Cherre Blanc ([name])   dx 02-14-2017 w/ needle core bx;  high grade pleomorphic spindle cell sarcoma , grade 2 (cT2N0M0); 03-15-2017  radical resection sarcoma tumor right lower leg  and completed radiation 06-06-2017  . Self-catheterizes urinary bladder    QID   AND   PRN  . 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 Urethral false passage 07/2019  . Urethral stricture urologist--- dr Jeffie Pollock   chronic---  post urethral dilation's  . Urinary retention with incomplete bladder emptying   . Vitamin B 12 deficiency 01/28/2011  . Vitamin D deficiency     Past Surgical History:  Procedure Laterality Date  . BALLOON DILATION N/A 11/10/2014   Procedure: CYSTO BALLOON DILATION AND RETROGRADE URETHROGRAM ;  Surgeon: Bjorn Loser, MD;  Location: [name];  Service: Urology;  Laterality: N/A;  . CATARACT EXTRACTION W/ INTRAOCULAR LENS  IMPLANT, BILATERAL  2012  approx  . CYSTO/ BALLOON DILATION OF  URETHRAL STRICTURE  12-25-2010  . CYSTOSCOPY WITH RETROGRADE URETHROGRAM N/A 03/30/2016   Procedure: CYSTOSCOPY WITH RETROGRADE URETHROGRAM AND BALLOON DILATION with cystogram;  Surgeon: Bjorn Loser, MD;  Location: [facility];  Service: Urology;  Laterality: N/A;  . CYSTOSCOPY WITH URETHRAL DILATATION  05/31/2011   Procedure: CYSTOSCOPY WITH URETHRAL DILATATION;  Surgeon: Reece Packer, MD;  Location: [facility];  Service: Urology;  Laterality: N/A;  BALLOON DILATION  . CYSTOSCOPY WITH URETHRAL DILATATION N/A 09/13/2016   Procedure: CYSTOSCOPY WITH URETHRAL BALLOON DILATATION;  Surgeon: Bjorn Loser, MD;  Location: [place];  Service: Urology;  Laterality: N/A;  . CYSTOSCOPY WITH URETHRAL DILATATION N/A 03/30/2017   Procedure: CYSTOSCOPY WITH BALLOON URETHRAL DILATATION;  Surgeon: Bjorn Loser, MD;  Location: [place];  Service: Urology;  Laterality: N/A;  . CYSTOSCOPY/RETROGRADE/URETEROSCOPY N/A 05/22/2012   Procedure: CYSTOSCOPY BALLOON DILATION RETROGRADE URETEROGRAM ;  Surgeon: Reece Packer, MD;  Location: [place];  Service: Urology;  Laterality: N/A;  . CYTSO/  DILATATION URETHRAL STRICTURE/  BX PROSTATIC URETHRA/  REMOVAL FOREIGN BODIES  06-25-2010   [facility]   . ECTROPION REPAIR Left 06/18/2019   lower eyelid  . INGUINAL HERNIA REPAIR Right 2000  . MOHS SURGERY  01/ 2019    [facility]   left lower leg for SCC  . MOHS SURGERY  12/25/2018   for [facility] of left lower eyelid;  the second stage Hughs flap release 01-09-2019  . RADICAL RESECTION OF SARCOMA TUMOR   03-15-2017   [facility]   RIGHT LOWER LEG , CALF AREA  . RADIOACTIVE SEED IMPLANTS, PROSTATE  JAN  2003  . SKIN LESION EXCISION  07/2012   MOST  right shoulder  . TONSILLECTOMY Left 11-10-2011  @[facility]    tonsillar cancer    Social History   Socioeconomic History  . Marital status: Married    Spouse name: Not on file  . Number of children: 4  . Years of education: college  . Highest education level: Not on file  Occupational History  . Occupation: retired  Tobacco Use  . Smoking status: Never Smoker  . Smokeless tobacco: Never  Used  Vaping Use  . Vaping Use: Never used  Substance and Sexual Activity  . Alcohol use: Yes    Alcohol/week: 7.0 standard drinks    Types: 7 Glasses of wine per week    Comment: 8oz, glass or red wine  . Drug use: No  . Sexual activity: Not on file  Other Topics Concern  . 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    Social Determinants of Health   Financial Resource Strain:   . Difficulty of Paying Living Expenses: Not on file  Food Insecurity:   . Worried About Charity fundraiser in the Last Year: Not on file  . Ran Out of Food in the Last Year: Not on file  Transportation Needs:   . Lack of Transportation (Medical): Not on file  . Lack of Transportation (Non-Medical): Not on file  Physical Activity:   . Days of Exercise per Week: Not on file  . Minutes of Exercise per Session: Not on file  Stress:   . Feeling of Stress : Not on file  Social Connections:   . Frequency of Communication with Friends and Family: Not on file  . Frequency of  Social Gatherings with Friends and Family: Not on file  . Attends Religious Services: Not on file  . Active Member of Clubs or Organizations: Not on file  . Attends Archivist Meetings: Not on file  . Marital Status: Not on file  Intimate Partner Violence:   . Fear of Current or Ex-Partner: Not on file  . Emotionally Abused: Not on file  . Physically Abused: Not on file  . Sexually Abused: Not on file    Family History  Problem Relation Age of Onset  . Stroke Father     Anti-infectives: Anti-infectives (From admission, onward)   Start     Dose/Rate Route Frequency Ordered Stop   09/12/19 1034  ciprofloxacin (CIPRO) IVPB 400 mg        400 mg 200 mL/hr over 60 Minutes Intravenous 60 min pre-op 09/12/19 1034        Current Facility-Administered Medications  Medication Dose Route Frequency Provider Last Rate Last Admin  . 0.9 %  sodium chloride infusion   Intravenous Continuous Oleta Mouse, MD      . ciprofloxacin (CIPRO) IVPB 400 mg  400 mg Intravenous 60 min Pre-Op Irine Seal, MD         Objective: Vital signs in last 24 hours: Ht 5\' 11"  (1.803 m)   Wt 71.2 kg   BMI 21.90 kg/m   Intake/Output from previous day: No intake/output data recorded. Intake/Output this shift: No intake/output data recorded.   Physical Exam Vitals reviewed.  Constitutional:      Appearance: Normal appearance.  Cardiovascular:     Rate and Rhythm: Normal rate and regular rhythm.  Pulmonary:     Effort: Pulmonary effort is normal. No respiratory distress.  Neurological:     Mental Status: He is alert.     Lab Results:  Results for orders placed or performed during the [name] encounter of 09/12/19 (from the past 24 hour(s))  SARS Coronavirus 2 by RT PCR ([name] order, performed in [name]) Nasopharyngeal Nasopharyngeal Swab     Status: None   Collection Time: 09/12/19  9:02 AM   Specimen: Nasopharyngeal Swab  Result Value Ref Range  SARS  Coronavirus 2 NEGATIVE NEGATIVE    BMET No results for input(s): NA, K, CL, CO2, GLUCOSE, BUN, CREATININE, CALCIUM in the last 72 hours. PT/INR No results for input(s): LABPROT, INR in the last 72 hours. ABG No results for input(s): PHART, HCO3 in the last 72 hours.  Invalid input(s): PCO2, PO2  Studies/Results: No results found.   Assessment/Plan: Urethral false passage in the prostatic urethra.   I am going to try to incise the bladder neck to smooth out the false passage and ease his self cathing.     Meds ordered this encounter  Medications  . 0.9 %  sodium chloride infusion  . ciprofloxacin (CIPRO) IVPB 400 mg    Order Specific Question:   Indication:    Answer:   Surgical Prophylaxis        No follow-ups on file.        Irine Seal 09/12/2019 [phone]

## 2019-09-12 NOTE — Discharge Instructions (Addendum)
Transurethral Resection of the Prostate, Care After This sheet gives you information about how to care for yourself after your procedure. Your health care provider may also give you more specific instructions. If you have problems or questions, contact your health care provider. What can I expect after the procedure? After the procedure, it is common to have:  Mild pain in your lower abdomen.  Soreness or mild discomfort in your penis from having the catheter inserted during the procedure.  A feeling of urgency when you need to urinate.  A small amount of blood in your urine. You may notice some small blood clots in your urine. These are normal. Follow these instructions at home: Medicines  Take over-the-counter and prescription medicines only as told by your health care provider.  If you were prescribed an antibiotic medicine, take it as told by your health care provider. Do not stop taking the antibiotic even if you start to feel better.  Ask your health care provider if the medicine prescribed to you: ? Requires you to avoid driving or using heavy machinery. ? Can cause constipation. You may need to take actions to prevent or treat constipation, such as:  Take over-the-counter or prescription medicines.  Eat foods that are high in fiber, such as fresh fruits and vegetables, whole grains, and beans.  Limit foods that are high in fat and processed sugars, such as fried or sweet foods.  Do not drive for 24 hours if you were given a sedative during your procedure. Activity   Return to your normal activities as told by your health care provider. Ask your health care provider what activities are safe for you.  Do not lift anything that is heavier than 10 lb (4.5 kg), or the limit that you are told, for 3 weeks after the procedure or until your health care provider says that it is safe.  Avoid intense physical activity for as long as told by your health care provider.  Avoid  sitting for a long time without moving. Get up and move around one or more times every few hours. This helps to prevent blood clots. You may increase your physical activity gradually as you start to feel better. Lifestyle  Do not drink alcohol for as long as told by your health care provider. This is especially important if you are taking prescription pain medicines.  Do not engage in sexual activity until your health care provider says that you can do this. General instructions   Do not take baths, swim, or use a hot tub until your health care provider approves.  Drink enough fluid to keep your urine pale yellow.  Urinate as soon as you feel the need to. Do not try to hold your urine for long periods of time.  If your health care provider approves, you may take a stool softener for 2-3 weeks to prevent you from straining to have a bowel movement.  Wear compression stockings as told by your health care provider. These stockings help to prevent blood clots and reduce swelling in your legs.  Keep all follow-up visits as told by your health care provider. This is important. Contact a health care provider if you have:  Difficulty urinating.  A fever.  Pain that gets worse or does not improve with medicine.  Blood in your urine that does not go away after 1 week of resting and drinking more fluids.  Swelling in your penis or testicles. Get help right away if:  You are unable   to urinate.  You are having more blood clots in your urine instead of fewer.  You have: ? Large blood clots. ? A lot of blood in your urine. ? Pain in your back or lower abdomen. ? Pain or swelling in your legs. ? Chills and you are shaking. ? Difficulty breathing or shortness of breath. Summary  After the procedure, it is common to have a small amount of blood in your urine.  Avoid heavy lifting and intense physical activity for as long as told by your health care provider.  Urinate as soon as you  feel the need to. Do not try to hold your urine for long periods of time.  Keep all follow-up visits as told by your health care provider. This is important. This information is not intended to replace advice given to you by your health care provider. Make sure you discuss any questions you have with your health care provider. Document Revised: 04/25/2018 Document Reviewed: 10/04/2017 Elsevier Patient Education  [place], Adult An indwelling urinary catheter is a thin, flexible, germ-free (sterile) tube that is placed into the bladder to help drain urine out of the body. The catheter is inserted into the part of the body that drains urine from the bladder (urethra). Urine drains from the catheter into a drainage bag outside of the body. Taking good care of your catheter will keep it working properly and help to prevent problems from developing. What are the risks?  Bacteria may get into your bladder and cause a urinary tract infection.  Urine flow can become blocked. This can happen if the catheter is not working correctly, or if you have sediment or a blood clot in your bladder or the catheter.  Tissue near the catheter may become irritated and bleed. How to wear your catheter and your drainage bag Supplies needed  Adhesive tape or a leg strap.  Alcohol wipe or soap and water (if you use tape).  A clean towel (if you use tape).  Overnight drainage bag.  Smaller drainage bag (leg bag). Wearing your catheter and bag Use adhesive tape or a leg strap to attach your catheter to your leg.  Make sure the catheter is not pulled tight.  If a leg strap gets wet, replace it with a dry one.  If you use adhesive tape: 1. Use an alcohol wipe or soap and water to wash off any stickiness on your skin where you had tape before. 2. Use a clean towel to pat-dry the area. 3. Apply the new tape. You should have received a large overnight drainage bag and  a smaller leg bag that fits underneath clothing.  You may wear the overnight bag at any time, but you should not wear the leg bag at night.  Always wear the leg bag below your knee.  Make sure the overnight drainage bag is always lower than the level of your bladder, but do not let it touch the floor. Before you go to sleep, hang the bag inside a wastebasket that is covered by a clean plastic bag. How to care for your skin around the catheter     Supplies needed  A clean washcloth.  Water and mild soap.  A clean towel. Caring for your skin and catheter  Every day, use a clean washcloth and soapy water to clean the skin around your catheter. 1. Wash your hands with soap and water. 2. Wet a washcloth in warm water and mild soap.  3. Clean the skin around your urethra.  If you are male:  Use one hand to gently spread the folds of skin around your vagina (labia).  With the washcloth in your other hand, wipe the inner side of your labia on each side. Do this in a front-to-back direction.  If you are male:  Use one hand to pull back any skin that covers the end of your penis (foreskin).  With the washcloth in your other hand, wipe your penis in small circles. Start wiping at the tip of your penis, then move outward from the catheter.  Move the foreskin back in place, if this applies. 4. With your free hand, hold the catheter close to where it enters your body. Keep holding the catheter during cleaning so it does not get pulled out. 5. Use your other hand to clean the catheter with the washcloth.  Only wipe downward on the catheter.  Do not wipe upward toward your body, because that may push bacteria into your urethra and cause infection. 6. Use a clean towel to pat-dry the catheter and the skin around it. Make sure to wipe off all soap. 7. Wash your hands with soap and water.  Shower every day. Do not take baths.  Do not use cream, ointment, or lotion on the area where the  catheter enters your body, unless your health care provider tells you to do that.  Do not use powders, sprays, or lotions on your genital area.  Check your skin around the catheter every day for signs of infection. Check for: ? Redness, swelling, or pain. ? Fluid or blood. ? Warmth. ? Pus or a bad smell. How to empty the drainage bag Supplies needed  Rubbing alcohol.  Gauze pad or cotton ball.  Adhesive tape or a leg strap. Emptying the bag Empty your drainage bag (your overnight drainage bag or your leg bag) when it is ?- full, or at least 2-3 times a day. Clean the drainage bag according to the manufacturer's instructions or as told by your health care provider. 1. Wash your hands with soap and water. 2. Detach the drainage bag from your leg. 3. Hold the drainage bag over the toilet or a clean container. Make sure the drainage bag is lower than your hips and bladder. This stops urine from going back into the tubing and into your bladder. 4. Open the pour spout at the bottom of the bag. 5. Empty the urine into the toilet or container. Do not let the pour spout touch any surface. This precaution is important to prevent bacteria from getting in the bag and causing infection. 6. Apply rubbing alcohol to a gauze pad or cotton ball. 7. Use the gauze pad or cotton ball to clean the pour spout. 8. Close the pour spout. 9. Attach the bag to your leg with adhesive tape or a leg strap. 10. Wash your hands with soap and water. How to change the drainage bag Supplies needed:  Alcohol wipes.  A clean drainage bag.  Adhesive tape or a leg strap. Changing the bag Replace your drainage bag with a clean bag if it leaks, starts to smell bad, or looks dirty. 1. Wash your hands with soap and water. 2. Detach the dirty drainage bag from your leg. 3. Pinch the catheter with your fingers so that urine does not spill out. 4. Disconnect the catheter tube from the drainage tube at the connection  valve. Do not let the tubes touch any surface. 5.  Clean the end of the catheter tube with an alcohol wipe. Use a different alcohol wipe to clean the end of the drainage tube. 6. Connect the catheter tube to the drainage tube of the clean bag. 7. Attach the clean bag to your leg with adhesive tape or a leg strap. Avoid attaching the new bag too tightly. 8. Wash your hands with soap and water. General instructions   Never pull on your catheter or try to remove it. Pulling can damage your internal tissues.  Always wash your hands before and after you handle your catheter or drainage bag. Use a mild, fragrance-free soap. If soap and water are not available, use hand sanitizer.  Always make sure there are no twists or bends (kinks) in the catheter tube.  Always make sure there are no leaks in the catheter or drainage bag.  Drink enough fluid to keep your urine pale yellow.  Do not take baths, swim, or use a hot tub.  If you are male, wipe from front to back after having a bowel movement. Contact a health care provider if:  Your urine is cloudy.  Your urine smells unusually bad.  Your catheter gets clogged.  Your catheter starts to leak.  Your bladder feels full. Get help right away if:  You have redness, swelling, or pain where the catheter enters your body.  You have fluid, blood, pus, or a bad smell coming from the area where the catheter enters your body.  The area where the catheter enters your body feels warm to the touch.  You have a fever.  You have pain in your abdomen, legs, lower back, or bladder.  You see blood in the catheter.  Your urine is pink or red.  You have nausea, vomiting, or chills.  Your urine is not draining into the bag.  Your catheter gets pulled out. Summary  An indwelling urinary catheter is a thin, flexible, germ-free (sterile) tube that is placed into the bladder to help drain urine out of the body.  The catheter is inserted into the  part of the body that drains urine from the bladder (urethra).  Take good care of your catheter to keep it working properly and help prevent problems from developing.  Always wash your hands before and after you handle your catheter or drainage bag.  Never pull on your catheter or try to remove it.  Please call for fever >101 or catheter issues.  This information is not intended to replace advice given to you by your health care provider. Make sure you discuss any questions you have with your health care provider. Document Revised: 04/27/2018 Document Reviewed: 08/19/2016 Elsevier Patient Education  [place] Instructions  Activity: Get plenty of rest for the remainder of the day. A responsible adult should stay with you for 24 hours following the procedure.  For the next 24 hours, DO NOT: -Drive a car -Paediatric nurse -Drink alcoholic beverages -Take any medication unless instructed by your physician -Make any legal decisions or sign important papers.  Meals: Start with liquid foods such as gelatin or soup. Progress to regular foods as tolerated. Avoid greasy, spicy, heavy foods. If nausea and/or vomiting occur, drink only clear liquids until the nausea and/or vomiting subsides. Call your physician if vomiting continues.  Special Instructions/Symptoms: Your throat may feel dry or sore from the anesthesia or the breathing tube placed in your throat during surgery. If this causes discomfort, gargle with warm salt  water. The discomfort should disappear within 24 hours.  If you had a scopolamine patch placed behind your ear for the management of post- operative nausea and/or vomiting:  1. The medication in the patch is effective for 72 hours, after which it should be removed.  Wrap patch in a tissue and discard in the trash. Wash hands thoroughly with soap and water. 2. You may remove the patch earlier than 72 hours if you experience unpleasant  side effects which may include dry mouth, dizziness or visual disturbances. 3. Avoid touching the patch. Wash your hands with soap and water after contact with the patch.

## 2019-09-12 NOTE — Anesthesia Preprocedure Evaluation (Signed)
Anesthesia Evaluation  Patient identified by MRN, date of birth, ID band Patient awake    Reviewed: Allergy & Precautions, NPO status , Patient's Chart, lab work & pertinent test results  Airway Mallampati: I  TM Distance: >3 FB Neck ROM: Full    Dental   Pulmonary sleep apnea ,    Pulmonary exam normal        Cardiovascular hypertension, Pt. on medications Normal cardiovascular exam     Neuro/Psych Depression    GI/Hepatic GERD  Medicated and Controlled,  Endo/Other    Renal/GU      Musculoskeletal   Abdominal   Peds  Hematology   Anesthesia Other Findings   Reproductive/Obstetrics                             Anesthesia Physical Anesthesia Plan  ASA: III  Anesthesia Plan: General   Post-op Pain Management:    Induction: Intravenous  PONV Risk Score and Plan: 2 and Ondansetron and Treatment may vary due to age or medical condition  Airway Management Planned: LMA  Additional Equipment:   Intra-op Plan:   Post-operative Plan: Extubation in OR  Informed Consent: I have reviewed the patients History and Physical, chart, labs and discussed the procedure including the risks, benefits and alternatives for the proposed anesthesia with the patient or authorized representative who has indicated his/her understanding and acceptance.       Plan Discussed with: CRNA and Surgeon  Anesthesia Plan Comments:         Anesthesia Quick Evaluation

## 2019-09-12 NOTE — Op Note (Signed)
Procedure: 1.  Cystoscopy with urethral dilation. 2.  Transurethral resection of prostate.  Preop diagnosis: 1.  Bulbar urethral stricture. 2.  Prostatic urethral false passage.  Postop diagnosis: Same with the prostate middle lobe.  Surgeon: Dr. Irine Seal.  Anesthesia: General.  Specimen: Prostate chips.  Drains: 9 French Foley catheter.  EBL: None.  Complications: None.  Indications: The patient is an 86 year old male with a history of prostate cancer treated with seed implant in 2003.  He has a proximal bulbar urethral stricture and has been on self-catheterization and has had multiple dilations over the past several years.  He recently had difficulty with catheterization and was found on office cystoscopy to have a chronic false passage in the posterior prostatic urethra.  It was felt that incision of the prostate to smooth out the defect was indicated.  Procedure: He was taken operating room where he was given Cipro.  A general anesthetic was induced.  He was placed in lithotomy position was fitted with PAS hose.  He was prepped with Betadine solution and draped in usual sterile fashion.  An initial attempt to pass the 16 French continuous-flow resectoscope sheath with the aid of visual obturator was unsuccessful due to the stricture in the proximal bulb.  I then inserted the 23 French cystoscope sheath and was able to successfully negotiated through the stricture into the prostatic urethra where the false passage was readily identified and appeared more sizable than on office cystoscopy.  He had a small middle lobe which was contributing to the angulation at the bladder neck as well.  Examination of bladder revealed moderate trabeculation with cellules and bladder wall erythema from his chronic catheterization.  Ureteral orifices were unremarkable.  I then used [employer] sounds to dilate the urethral stricture from 24-30 [place].  The 32 French continuous flow resectoscope sheath was  then inserted with the aid of visual obturator into the bladder.  The visual obturator was replaced with an Beatrix Fetters handle with a Collins knife and the 30 degree lens.  Saline was used for the irrigant.  Initially an incision was made at 5:00 through the bladder neck but I quickly realized that I would be able to provide a better result if I resected the small middle lobe.  The [employer] was replaced with a bipolar loop and the middle lobe was resected sufficient to eliminate the false passage.  Minimal lateral lobe resection proximally was performed to eliminate ragged edges.  Once an adequate resection had been performed, the bladder was evacuated free of chips and final hemostasis was achieved.  The resectoscope was removed and a 36 French Foley catheter was easily passed into the bladder.  The balloon was filled with 10 mL of sterile fluid and the catheter was placed to straight drainage.  He was taken down from lithotomy position, his anesthetic was reversed and he was moved to recovery in stable condition.  There were no complications.

## 2019-09-12 NOTE — Anesthesia Procedure Notes (Signed)
Procedure Name: LMA Insertion Date/Time: 09/12/2019 12:14 PM Performed by: Irbin Fines D, CRNA Pre-anesthesia Checklist: Patient identified, Emergency Drugs available, Suction available and Patient being monitored Patient Re-evaluated:Patient Re-evaluated prior to induction Oxygen Delivery Method: Circle system utilized Preoxygenation: Pre-oxygenation with 100% oxygen Induction Type: IV induction Ventilation: Mask ventilation without difficulty LMA: LMA inserted LMA Size: 4.0 Number of attempts: 1 Airway Equipment and Method: Patient positioned with wedge pillow Placement Confirmation: positive ETCO2 and breath sounds checked- equal and bilateral Tube secured with: Tape Dental Injury: Teeth and Oropharynx as per pre-operative assessment

## 2019-09-12 NOTE — Transfer of Care (Signed)
Immediate Anesthesia Transfer of Care Note  Patient: Philip [name]  Procedure(s) Performed: TRANSURETHRAL INCISION OF BLADDER NECK (N/A Bladder) CYSTOSCOPY WITH URETHRAL DILATATION (N/A Renal)  Patient Location: PACU  Anesthesia Type:General  Level of Consciousness: drowsy and patient cooperative  Airway & Oxygen Therapy: Patient Spontanous Breathing and Patient connected to face mask oxygen  Post-op Assessment: Report given to RN and Post -op Vital signs reviewed and stable  Post vital signs: Reviewed and stable  Last Vitals:  Vitals Value Taken Time  BP 135/77 09/12/19 1250  Temp 36.4 C 09/12/19 1250  Pulse 64 09/12/19 1253  Resp 11 09/12/19 1253  SpO2 100 % 09/12/19 1253  Vitals shown include unvalidated device data.  Last Pain:  Vitals:   09/12/19 1103  TempSrc: Oral  PainSc: 0-No pain      Patients Stated Pain Goal: 5 (05/69/79 [id number])  Complications: No complications documented.

## 2019-09-13 ENCOUNTER — Encounter (HOSPITAL_BASED_OUTPATIENT_CLINIC_OR_DEPARTMENT_OTHER): Payer: Self-pay | Admitting: Urology

## 2019-09-13 LAB — SURGICAL PATHOLOGY

## 2019-09-23 ENCOUNTER — Other Ambulatory Visit: Payer: Self-pay | Admitting: Internal Medicine

## 2019-09-25 DIAGNOSIS — Z85828 Personal history of other malignant neoplasm of skin: Secondary | ICD-10-CM | POA: Diagnosis not present

## 2019-09-25 DIAGNOSIS — D225 Melanocytic nevi of trunk: Secondary | ICD-10-CM | POA: Diagnosis not present

## 2019-09-25 DIAGNOSIS — L814 Other melanin hyperpigmentation: Secondary | ICD-10-CM | POA: Diagnosis not present

## 2019-09-25 DIAGNOSIS — L821 Other seborrheic keratosis: Secondary | ICD-10-CM | POA: Diagnosis not present

## 2019-09-25 DIAGNOSIS — L570 Actinic keratosis: Secondary | ICD-10-CM | POA: Diagnosis not present

## 2019-09-25 DIAGNOSIS — L578 Other skin changes due to chronic exposure to nonionizing radiation: Secondary | ICD-10-CM | POA: Diagnosis not present

## 2019-09-25 DIAGNOSIS — I890 Lymphedema, not elsewhere classified: Secondary | ICD-10-CM | POA: Diagnosis not present

## 2019-09-26 DIAGNOSIS — N35012 Post-traumatic membranous urethral stricture: Secondary | ICD-10-CM | POA: Diagnosis not present

## 2019-09-26 DIAGNOSIS — R3914 Feeling of incomplete bladder emptying: Secondary | ICD-10-CM | POA: Diagnosis not present

## 2019-09-26 DIAGNOSIS — R3915 Urgency of urination: Secondary | ICD-10-CM | POA: Diagnosis not present

## 2019-10-01 DIAGNOSIS — E039 Hypothyroidism, unspecified: Secondary | ICD-10-CM | POA: Diagnosis not present

## 2019-10-07 DIAGNOSIS — E039 Hypothyroidism, unspecified: Secondary | ICD-10-CM | POA: Diagnosis not present

## 2019-10-08 DIAGNOSIS — H019 Unspecified inflammation of eyelid: Secondary | ICD-10-CM | POA: Diagnosis not present

## 2019-10-08 DIAGNOSIS — Z85828 Personal history of other malignant neoplasm of skin: Secondary | ICD-10-CM | POA: Diagnosis not present

## 2019-10-08 DIAGNOSIS — H02115 Cicatricial ectropion of left lower eyelid: Secondary | ICD-10-CM | POA: Diagnosis not present

## 2019-10-17 DIAGNOSIS — M62552 Muscle wasting and atrophy, not elsewhere classified, left thigh: Secondary | ICD-10-CM | POA: Diagnosis not present

## 2019-10-17 DIAGNOSIS — N189 Chronic kidney disease, unspecified: Secondary | ICD-10-CM | POA: Diagnosis not present

## 2019-10-17 DIAGNOSIS — C44311 Basal cell carcinoma of skin of nose: Secondary | ICD-10-CM | POA: Diagnosis not present

## 2019-10-17 DIAGNOSIS — M79606 Pain in leg, unspecified: Secondary | ICD-10-CM | POA: Diagnosis not present

## 2019-10-17 DIAGNOSIS — I776 Arteritis, unspecified: Secondary | ICD-10-CM | POA: Diagnosis not present

## 2019-10-17 DIAGNOSIS — E039 Hypothyroidism, unspecified: Secondary | ICD-10-CM | POA: Diagnosis not present

## 2019-10-17 DIAGNOSIS — M8580 Other specified disorders of bone density and structure, unspecified site: Secondary | ICD-10-CM | POA: Diagnosis not present

## 2019-10-17 DIAGNOSIS — R5383 Other fatigue: Secondary | ICD-10-CM | POA: Diagnosis not present

## 2019-10-31 ENCOUNTER — Encounter (INDEPENDENT_AMBULATORY_CARE_PROVIDER_SITE_OTHER): Payer: Medicare Other | Admitting: Ophthalmology

## 2019-11-07 ENCOUNTER — Encounter (INDEPENDENT_AMBULATORY_CARE_PROVIDER_SITE_OTHER): Payer: Self-pay | Admitting: Ophthalmology

## 2019-11-07 ENCOUNTER — Ambulatory Visit (INDEPENDENT_AMBULATORY_CARE_PROVIDER_SITE_OTHER): Payer: Medicare Other | Admitting: Ophthalmology

## 2019-11-07 ENCOUNTER — Other Ambulatory Visit: Payer: Self-pay

## 2019-11-07 DIAGNOSIS — H43813 Vitreous degeneration, bilateral: Secondary | ICD-10-CM | POA: Diagnosis not present

## 2019-11-07 DIAGNOSIS — H353132 Nonexudative age-related macular degeneration, bilateral, intermediate dry stage: Secondary | ICD-10-CM

## 2019-11-07 NOTE — Assessment & Plan Note (Signed)

## 2019-11-07 NOTE — Assessment & Plan Note (Signed)

## 2019-11-07 NOTE — Progress Notes (Signed)
11/07/2019     CHIEF COMPLAINT Patient presents for Retina Follow Up   HISTORY OF PRESENT ILLNESS: Philip [name] is a 86 y.o. male who presents to the clinic today for:   HPI    Retina Follow Up    Patient presents with  Dry AMD.  In both eyes.  Severity is moderate.  Duration of 4 months.  Since onset it is stable.  I, the attending physician,  performed the HPI with the patient and updated documentation appropriately.          Comments    4 Month Dry AMD f\u OU. OCT  Pt states he may need a new g;s rx. Pt states he had a basil cell on lower left lid.       Last edited by Tilda Franco on 11/07/2019  1:15 PM. (History)      Referring physician: Gayland Curry, DO [place],  [place] [id number]  HISTORICAL INFORMATION:   Selected notes from the MEDICAL RECORD NUMBER       CURRENT MEDICATIONS: No current outpatient medications on file. (Ophthalmic Drugs)   No current facility-administered medications for this visit. (Ophthalmic Drugs)   Current Outpatient Medications (Other)  Medication Sig  . buPROPion (WELLBUTRIN) 100 MG tablet Take 1 tablet by mouth twice daily  . Cholecalciferol 100 MCG (4000 UT) CAPS Take 1 capsule by mouth daily. VIT D3  . Coenzyme Q10 (COQ10) 100 MG CAPS Take 100 mg by mouth daily. Take one daily  . cyanocobalamin (,VITAMIN B-12,) 1000 MCG/ML injection Inject 1 mL (1,000 mcg total) into the muscle every 14 (fourteen) days.  . Cyanocobalamin-Methylcobalamin 600-600 MCG SUBL Place under the tongue daily.  . fish oil-omega-3 fatty acids 1000 MG capsule Take 2 g by mouth daily. Name:  Natural Factors Omega-3 (Patient not taking: Reported on 09/06/2019)  . Garlic (GARLIQUE PO) Take by mouth every other day.  . lisinopril (ZESTRIL) 10 MG tablet Take 1 tablet by mouth once daily (Patient taking differently: Take 10 mg by mouth at bedtime. )  . Multiple Vitamins-Minerals (ICAPS AREDS 2 PO) Take 2 tablets by mouth daily.  . NP THYROID  90 MG tablet Take 90 mg by mouth daily.   Marland Kitchen RESVERATROL PO Take 100 mg by mouth daily.   . TURMERIC CURCUMIN PO Take 2 tablets by mouth daily.   Marland Kitchen UNABLE TO FIND every other day. Mercie Eon take once daily - Vitamin E  . UNABLE TO FIND [place] take once daily  . UNABLE TO FIND [place] take 4 tablets once daily  . UNABLE TO FIND Comprehensive immune support take 2 tablets once daily  . UNABLE TO FIND Modified citrus pectin take 4 tablets once daily  . UNABLE TO FIND Celery seed take 2 tablets by mouth once daily   No current facility-administered medications for this visit. (Other)      REVIEW OF SYSTEMS:    ALLERGIES Allergies  Allergen Reactions  . Cefixime Rash  . Contrast Media [Iodinated Diagnostic Agents] Other (See Comments)    Avoid due to renal disease  . Macrobid [Nitrofurantoin Macrocrystal] Swelling  . Nsaids Other (See Comments)    Has been told no NSAIDs, Ibuprofen Renal insufficiency hx.   . Cephalosporins Rash    PAST MEDICAL HISTORY Past Medical History:  Diagnosis Date  . Age-related macular degeneration, dry, both eyes   . 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 CKD (chronic kidney disease), stage III [facility])    nephrologist-- dr Johnette Abraham. [name]  . Complication of anesthesia    post op acute urinary retention  . DDD (degenerative disc disease)   . Fatigue    pt evalulation by cardiology--- dr Fransico Michael, note 05-21-2019 in epic,(non cardiac)  normal echo and event monitor showed extra beats of atrial tachycardia , no atrial fib, and rare ecotpy  . Full dentures   . GERD (gastroesophageal reflux disease)   . History of basal cell carcinoma (BCC) excision    12-25-2018 MOH's reconstruction left lower eyelid  . History of external beam radiation therapy 2002   prostate cancer  and boost with radioative prostate seed implants  . History  of prostate cancer DX  2002   S/P EXTERNAL RADIATION/ RADIOACTIVE SEED IMPLANTS  2003  . History of squamous cell carcinoma excision    2012--  RIGHT LOWER EXTREM;  2019 LEFT LOWER EXTREMITIY  . HTN (hypertension)    followed by pcp  . Hyperlipidemia   . Hypothyroidism    followed by pcp--- acquired atrophy due to radiation;  previously seen by endocrinologist-  dr balan--- per pt takes his own thyroid med. called NP Thyroid 90 mg daily, does not take synthroid  . Large vessel vasculitis ([place])    rheumotologist-  dr syed --- IgG4 related aoritis involving renal arteries ,  pt treated with predisone.  (09-06-2019  per pt no longer prednisone last taken one month ago 07/ 2021, weaned off by pcp per pt request)  . Left renal artery stenosis [facility])    nephrologist--- dr [name]--- with aneurysm proximal   . Lymphedema of left leg    followed by [facility]---- pt stated uses compression hose  . OSA (obstructive sleep apnea) 09-06-2019  per pt lasted used cpap approx. 2019, stated felt he no longer needed    pt retested 11-13-2016 at [facility] w/ dr ather-- moderate to severe OSA , AHI 16.4/h/  03-24-2017   . Pulmonary nodule followed by [facility]   per CT 06-11-2019  in [place] in epic done at [place],  LUL nodule , not mets  . Renal cyst, right   . Restless legs syndrome (RLS)   . S/P radiation therapy     for tonsillar cancer (head and neck) completed 01-26-2012 at [facility]);   and XRT for right lower leg sarcoma --- completed 06-06-2017  . Saliva decreased   . Sarcoma of lower extremity, right ([place]) oncolgoist-  dr Juluis Rainier ([facility]) & Dr Viona Gilmore. Cherre Blanc ([facility])   dx 02-14-2017 w/ needle core bx;  high grade pleomorphic spindle cell sarcoma , grade 2 (cT2N0M0); 03-15-2017  radical resection sarcoma tumor right lower leg  and completed radiation 06-06-2017  . Self-catheterizes urinary bladder    QID   AND   PRN  . 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 Urethral false passage 07/2019  . Urethral stricture urologist--- dr Jeffie Pollock   chronic---  post urethral dilation's  . Urinary retention with incomplete bladder emptying   . Vitamin B 12 deficiency 01/28/2011  .  Vitamin D deficiency    Past Surgical History:  Procedure Laterality Date  . BALLOON DILATION N/A 11/10/2014   Procedure: CYSTO BALLOON DILATION AND RETROGRADE URETHROGRAM ;  Surgeon: Bjorn Loser, MD;  Location: [facility];  Service: Urology;  Laterality: N/A;  . CATARACT EXTRACTION W/ INTRAOCULAR LENS  IMPLANT, BILATERAL  2012  approx  . CYSTO/ BALLOON DILATION OF  URETHRAL STRICTURE  12-25-2010  . CYSTOSCOPY WITH RETROGRADE URETHROGRAM N/A 03/30/2016   Procedure: CYSTOSCOPY WITH RETROGRADE URETHROGRAM AND BALLOON DILATION with cystogram;  Surgeon: Bjorn Loser, MD;  Location: [facility];  Service: Urology;  Laterality: N/A;  . CYSTOSCOPY WITH URETHRAL DILATATION  05/31/2011   Procedure: CYSTOSCOPY WITH URETHRAL DILATATION;  Surgeon: Reece Packer, MD;  Location: [place];  Service: Urology;  Laterality: N/A;  BALLOON DILATION  . CYSTOSCOPY WITH URETHRAL DILATATION N/A 09/13/2016   Procedure: CYSTOSCOPY WITH URETHRAL BALLOON DILATATION;  Surgeon: Bjorn Loser, MD;  Location: [place];  Service: Urology;  Laterality: N/A;  . CYSTOSCOPY WITH URETHRAL DILATATION N/A 03/30/2017   Procedure: CYSTOSCOPY WITH BALLOON URETHRAL DILATATION;  Surgeon: Bjorn Loser, MD;  Location: [place];  Service: Urology;  Laterality: N/A;  . CYSTOSCOPY WITH URETHRAL DILATATION N/A 09/12/2019   Procedure: CYSTOSCOPY  WITH URETHRAL DILATATION;  Surgeon: Irine Seal, MD;  Location[facility];  Service: Urology;  Laterality: N/A;  . CYSTOSCOPY/RETROGRADE/URETEROSCOPY N/A 05/22/2012   Procedure: CYSTOSCOPY BALLOON DILATION RETROGRADE URETEROGRAM ;  Surgeon: Reece Packer, MD;  Location: [place];  Service: Urology;  Laterality: N/A;  . CYTSO/  DILATATION URETHRAL STRICTURE/  BX PROSTATIC URETHRA/  REMOVAL FOREIGN BODIES  06-25-2010   [facility]  . ECTROPION REPAIR Left 06/18/2019   lower eyelid  . INGUINAL HERNIA REPAIR Right 2000  . MOHS SURGERY  01/ 2019    [facility]   left lower leg for SCC  . MOHS SURGERY  12/25/2018   for [facility] of left lower eyelid;  the second stage Hughs flap release 01-09-2019  . RADICAL RESECTION OF SARCOMA TUMOR   03-15-2017   [facility]   RIGHT LOWER LEG , CALF AREA  . RADIOACTIVE SEED IMPLANTS, PROSTATE  JAN  2003  . SKIN LESION EXCISION  07/2012   MOST  right shoulder  . TONSILLECTOMY Left 11-10-2011  @[facility]    tonsillar cancer  . TRANSURETHRAL INCISION OF BLADDER NECK N/A 09/12/2019   Procedure: TRANSURETHRAL INCISION OF BLADDER NECK;  Surgeon: Irine Seal, MD;  Location: [facility];  Service: Urology;  Laterality: N/A;    FAMILY HISTORY Family History  Problem Relation Age of Onset  . Stroke Father     SOCIAL HISTORY Social History   Tobacco Use  . Smoking status: Never Smoker  . Smokeless tobacco: Never Used  Vaping Use  . Vaping Use: Never used  Substance Use Topics  . Alcohol use: Yes    Alcohol/week: 7.0 standard drinks    Types: 7 Glasses of wine per week    Comment: 8oz, glass or red wine  . Drug use: No         OPHTHALMIC EXAM: Base Eye Exam    Visual Acuity (Snellen - Linear)      Right Left   Dist cc 20/50 -2 20/50 +   Dist ph cc NI 20/25 -2   Correction: Glasses       Tonometry (Tonopen, 1:21 PM)      Right Left   Pressure 7  8       Pupils      Pupils Dark Light Shape React APD   Right PERRL 4 3  Round Brisk None   Left PERRL 4 3 Round Brisk None       Visual Fields (Counting fingers)      Left Right    Full Full       Neuro/Psych    Oriented x3: Yes   Mood/Affect: Normal       Dilation    Both eyes: 1.0% Mydriacyl, 2.5% Phenylephrine @ 1:21 PM        Slit Lamp and Fundus Exam    External Exam      Right Left   External Normal Normal       Slit Lamp Exam      Right Left   Lids/Lashes Normal Normal   Conjunctiva/Sclera White and quiet White and quiet   Cornea Clear Clear   Anterior Chamber Deep and quiet Deep and quiet   Iris Round and reactive Round and reactive   Lens Posterior chamber intraocular lens Posterior chamber intraocular lens   Anterior Vitreous Normal Normal       Fundus Exam      Right Left   Posterior Vitreous Posterior vitreous detachment Posterior vitreous detachment   Disc Normal Normal   C/D Ratio 0.2 0.2   Macula Intermediate age related macular degeneration, Retinal pigment epithelial atrophy, Soft drusen, no exudates, no hemorrhage, Retinal pigment epithelial mottling Intermediate age related macular degeneration, Retinal pigment epithelial atrophy, Soft drusen, no exudates, no hemorrhage, Retinal pigment epithelial mottling   Vessels Normal Normal   Periphery Normal Normal          IMAGING AND PROCEDURES  Imaging and Procedures for 11/07/19  OCT, Retina - OU - Both Eyes       Right Eye Quality was good. Scan locations included subfoveal. Central Foveal Thickness: 274. Progression has been stable. Findings include retinal drusen .   Left Eye Quality was good. Scan locations included subfoveal. Central Foveal Thickness: 342. Progression has been stable. Findings include retinal drusen .   Notes No signs of CNVM                ASSESSMENT/PLAN:  Intermediate stage nonexudative age-related macular degeneration of both eyes The nature of age--related macular degeneration was discussed with the patient as well as the  distinction between dry and wet types. Checking an Amsler Grid daily with advice to return immediately should a distortion develop, was given to the patient. The patient 's smoking status now and in the past was determined and advice based on the AREDS study was provided regarding the consumption of antioxidant supplements. AREDS 2 vitamin formulation was recommended. Consumption of dark leafy vegetables and fresh fruits of various colors was recommended. Treatment modalities for wet macular degeneration particularly the use of intravitreal injections of anti-blood vessel growth factors was discussed with the patient. Avastin, Lucentis, and Eylea are the available options. On occasion, therapy includes the use of photodynamic therapy and thermal laser. Stressed to the patient do not rub eyes.  Patient was advised to check Amsler Grid daily and return immediately if changes are noted. Instructions on using the grid were given to the patient. All patient questions were answered.      ICD-10-CM   1. Intermediate stage nonexudative age-related macular degeneration of both eyes  H35.3132 OCT, Retina - OU - Both Eyes    1.  Bilateral intermediate age-related macular degeneration, with  subfoveal drusenoid pigment epithelial detachments.  No signs of choroidal neovascular membrane  .  Nonetheless pigmentary migration over the dome of the PED in the left eye could be accounting for vision.   2.  Explained TO  the patient to to control all modifiable factors including hypertension and personally my experience has been with my patients that those patients who have sleep apnea undiagnosed or untreated have certainly nighttime hypoxia.  I explained that the center of the vision requires more oxygen in any other part of the body.  At this time the patient replied that he have had 1 year of using a CPAP, which connotes the fact that he had testing and required this prescription.  Nonetheless he did not feel like his  sleep improved.  I do suggest that he consider retesting simply to make sure that the macular obtains maximum oxygenation throughout the night as well as during the daytime awake.  3.  Ophthalmic Meds Ordered this visit:  No orders of the defined types were placed in this encounter.      Return in about 6 months (around 05/07/2020) for DILATE OU, COLOR FP, OCT.  There are no Patient Instructions on file for this visit.   Explained the diagnoses, plan, and follow up with the patient and they expressed understanding.  Patient expressed understanding of the importance of proper follow up care.   Clent Demark Lakendria Nicastro M.D. Diseases & Surgery of the Retina and Vitreous [facility] 11/07/19     Abbreviations: M myopia (nearsighted); A astigmatism; H hyperopia (farsighted); P presbyopia; Mrx spectacle prescription;  CTL contact lenses; OD right eye; OS left eye; OU both eyes  XT exotropia; ET esotropia; PEK punctate epithelial keratitis; PEE punctate epithelial erosions; DES dry eye syndrome; MGD meibomian gland dysfunction; ATs artificial tears; PFAT's preservative free artificial tears; [place] nuclear sclerotic cataract; PSC posterior subcapsular cataract; ERM epi-retinal membrane; PVD posterior vitreous detachment; RD retinal detachment; DM diabetes mellitus; DR diabetic retinopathy; NPDR non-proliferative diabetic retinopathy; PDR proliferative diabetic retinopathy; CSME clinically significant macular edema; DME diabetic macular edema; dbh dot blot hemorrhages; CWS cotton wool spot; POAG primary open angle glaucoma; C/D cup-to-disc ratio; HVF humphrey visual field; GVF goldmann visual field; OCT optical coherence tomography; IOP intraocular pressure; BRVO Branch retinal vein occlusion; CRVO central retinal vein occlusion; CRAO central retinal artery occlusion; BRAO branch retinal artery occlusion; RT retinal tear; SB scleral buckle; PPV pars plana vitrectomy; VH Vitreous hemorrhage;  PRP panretinal laser photocoagulation; IVK intravitreal kenalog; VMT vitreomacular traction; MH Macular hole;  NVD neovascularization of the disc; NVE neovascularization elsewhere; AREDS age related eye disease study; ARMD age related macular degeneration; POAG primary open angle glaucoma; EBMD epithelial/anterior basement membrane dystrophy; ACIOL anterior chamber intraocular lens; IOL intraocular lens; PCIOL posterior chamber intraocular lens; Phaco/IOL phacoemulsification with intraocular lens placement; [place] photorefractive keratectomy; LASIK laser assisted in situ keratomileusis; HTN hypertension; DM diabetes mellitus; COPD chronic obstructive pulmonary disease

## 2019-11-13 ENCOUNTER — Encounter: Payer: Self-pay | Admitting: Internal Medicine

## 2019-11-14 ENCOUNTER — Ambulatory Visit (INDEPENDENT_AMBULATORY_CARE_PROVIDER_SITE_OTHER): Payer: Medicare Other | Admitting: Nurse Practitioner

## 2019-11-14 ENCOUNTER — Encounter: Payer: Self-pay | Admitting: Nurse Practitioner

## 2019-11-14 ENCOUNTER — Telehealth: Payer: Self-pay

## 2019-11-14 ENCOUNTER — Other Ambulatory Visit: Payer: Self-pay

## 2019-11-14 DIAGNOSIS — Z0000 Encounter for general adult medical examination without abnormal findings: Secondary | ICD-10-CM | POA: Diagnosis not present

## 2019-11-14 NOTE — Progress Notes (Signed)
This service is provided via telemedicine  No vital signs collected/recorded due to the encounter was a telemedicine visit.   Location of patient (ex: home, work):  Home  Patient consents to a telephone visit:  Yes, see encounter dated 11/14/2019  Location of the provider (ex: office, home):  [facility]  Name of any referring provider:  Hollace Kinnier, DO  Names of all persons participating in the telemedicine service and their role in the encounter:  Sherrie Mustache, Nurse Practitioner, Carroll Kinds, CMA, and patient.   Time spent on call:  14 minutes with medical assistant

## 2019-11-14 NOTE — Patient Instructions (Signed)
Philip [name] , Thank you for taking time to come for your Medicare Wellness Visit. I appreciate your ongoing commitment to your health goals. Please review the following plan we discussed and let me know if I can assist you in the future.   Screening recommendations/referrals: Colonoscopy aged out  Recommended yearly ophthalmology/optometry visit for glaucoma screening and checkup Recommended yearly dental visit for hygiene and checkup  Vaccinations: Influenza vaccine-  Pneumococcal vaccine-  Tdap vaccine- recommended to get at your local pharmacy Shingles vaccine- shingrix at local pharmacy  Advanced directives: on file.   Conditions/risks identified: advanced age.  Next appointment: 1 year   Preventive Care 84 Years and Older, Male Preventive care refers to lifestyle choices and visits with your health care provider that can promote health and wellness. What does preventive care include?  A yearly physical exam. This is also called an annual well check.  Dental exams once or twice a year.  Routine eye exams. Ask your health care provider how often you should have your eyes checked.  Personal lifestyle choices, including:  Daily care of your teeth and gums.  Regular physical activity.  Eating a healthy diet.  Avoiding tobacco and drug use.  Limiting alcohol use.  Practicing safe sex.  Taking low doses of aspirin every day.  Taking vitamin and mineral supplements as recommended by your health care provider. What happens during an annual well check? The services and screenings done by your health care provider during your annual well check will depend on your age, overall health, lifestyle risk factors, and family history of disease. Counseling  Your health care provider may ask you questions about your:  Alcohol use.  Tobacco use.  Drug use.  Emotional well-being.  Home and relationship well-being.  Sexual activity.  Eating habits.  History of  falls.  Memory and ability to understand (cognition).  Work and work Statistician. Screening  You may have the following tests or measurements:  Height, weight, and BMI.  Blood pressure.  Lipid and cholesterol levels. These may be checked every 5 years, or more frequently if you are over 84 years old.  Skin check.  Lung cancer screening. You may have this screening every year starting at age 84 if you have a 30-pack-year history of smoking and currently smoke or have quit within the past 15 years.  Fecal occult blood test (FOBT) of the stool. You may have this test every year starting at age 84.  Flexible sigmoidoscopy or colonoscopy. You may have a sigmoidoscopy every 5 years or a colonoscopy every 10 years starting at age 84.  Prostate cancer screening. Recommendations will vary depending on your family history and other risks.  Hepatitis C blood test.  Hepatitis B blood test.  Sexually transmitted disease (STD) testing.  Diabetes screening. This is done by checking your blood sugar (glucose) after you have not eaten for a while (fasting). You may have this done every 1-3 years.  Abdominal aortic aneurysm (AAA) screening. You may need this if you are a current or former smoker.  Osteoporosis. You may be screened starting at age 84 if you are at high risk. Talk with your health care provider about your test results, treatment options, and if necessary, the need for more tests. Vaccines  Your health care provider may recommend certain vaccines, such as:  Influenza vaccine. This is recommended every year.  Tetanus, diphtheria, and acellular pertussis (Tdap, Td) vaccine. You may need a Td booster every 10 years.  Zoster vaccine. You  may need this after age 31.  Pneumococcal 13-valent conjugate (PCV13) vaccine. One dose is recommended after age 3.  Pneumococcal polysaccharide (PPSV23) vaccine. One dose is recommended after age 28. Talk to your health care provider about  which screenings and vaccines you need and how often you need them. This information is not intended to replace advice given to you by your health care provider. Make sure you discuss any questions you have with your health care provider. Document Released: 01/30/2015 Document Revised: 09/23/2015 Document Reviewed: 11/04/2014 Elsevier Interactive Patient Education  2017 [place] Prevention in the Home Falls can cause injuries. They can happen to people of all ages. There are many things you can do to make your home safe and to help prevent falls. What can I do on the outside of my home?  Regularly fix the edges of walkways and driveways and fix any cracks.  Remove anything that might make you trip as you walk through a door, such as a raised step or threshold.  Trim any bushes or trees on the path to your home.  Use bright outdoor lighting.  Clear any walking paths of anything that might make someone trip, such as rocks or tools.  Regularly check to see if handrails are loose or broken. Make sure that both sides of any steps have handrails.  Any raised decks and porches should have guardrails on the edges.  Have any leaves, snow, or ice cleared regularly.  Use sand or salt on walking paths during winter.  Clean up any spills in your garage right away. This includes oil or grease spills. What can I do in the bathroom?  Use night lights.  Install grab bars by the toilet and in the tub and shower. Do not use towel bars as grab bars.  Use non-skid mats or decals in the tub or shower.  If you need to sit down in the shower, use a plastic, non-slip stool.  Keep the floor dry. Clean up any water that spills on the floor as soon as it happens.  Remove soap buildup in the tub or shower regularly.  Attach bath mats securely with double-sided non-slip rug tape.  Do not have throw rugs and other things on the floor that can make you trip. What can I do in the  bedroom?  Use night lights.  Make sure that you have a light by your bed that is easy to reach.  Do not use any sheets or blankets that are too big for your bed. They should not hang down onto the floor.  Have a firm chair that has side arms. You can use this for support while you get dressed.  Do not have throw rugs and other things on the floor that can make you trip. What can I do in the kitchen?  Clean up any spills right away.  Avoid walking on wet floors.  Keep items that you use a lot in easy-to-reach places.  If you need to reach something above you, use a strong step stool that has a grab bar.  Keep electrical cords out of the way.  Do not use floor polish or wax that makes floors slippery. If you must use wax, use non-skid floor wax.  Do not have throw rugs and other things on the floor that can make you trip. What can I do with my stairs?  Do not leave any items on the stairs.  Make sure that there are handrails on both  sides of the stairs and use them. Fix handrails that are broken or loose. Make sure that handrails are as long as the stairways.  Check any carpeting to make sure that it is firmly attached to the stairs. Fix any carpet that is loose or worn.  Avoid having throw rugs at the top or bottom of the stairs. If you do have throw rugs, attach them to the floor with carpet tape.  Make sure that you have a light switch at the top of the stairs and the bottom of the stairs. If you do not have them, ask someone to add them for you. What else can I do to help prevent falls?  Wear shoes that:  Do not have high heels.  Have rubber bottoms.  Are comfortable and fit you well.  Are closed at the toe. Do not wear sandals.  If you use a stepladder:  Make sure that it is fully opened. Do not climb a closed stepladder.  Make sure that both sides of the stepladder are locked into place.  Ask someone to hold it for you, if possible.  Clearly mark and make  sure that you can see:  Any grab bars or handrails.  First and last steps.  Where the edge of each step is.  Use tools that help you move around (mobility aids) if they are needed. These include:  Canes.  Walkers.  Scooters.  Crutches.  Turn on the lights when you go into a dark area. Replace any light bulbs as soon as they burn out.  Set up your furniture so you have a clear path. Avoid moving your furniture around.  If any of your floors are uneven, fix them.  If there are any pets around you, be aware of where they are.  Review your medicines with your doctor. Some medicines can make you feel dizzy. This can increase your chance of falling. Ask your doctor what other things that you can do to help prevent falls. This information is not intended to replace advice given to you by your health care provider. Make sure you discuss any questions you have with your health care provider. Document Released: 10/30/2008 Document Revised: 06/11/2015 Document Reviewed: 02/07/2014 Elsevier Interactive Patient Education  2017 [employer].

## 2019-11-14 NOTE — Progress Notes (Signed)
Subjective:   ALBY SCHWABE is a 86 y.o. male who presents for Medicare Annual/Subsequent preventive examination.  Review of Systems     Cardiac Risk Factors include: male gender;advanced age (>75men, >39 women);dyslipidemia;hypertension     Objective:    There were no vitals filed for this visit. There is no height or weight on file to calculate BMI.  Advanced Directives 11/14/2019 09/12/2019 09/04/2019 08/11/2019 08/07/2019 03/20/2019 02/01/2019  Does Patient Have a Medical Advance Directive? Yes Yes - No Yes Yes Yes  Type of Paramedic of [place];Out of facility DNR (pink MOST or yellow form) - Out of facility DNR (pink MOST or yellow form) - Out of facility DNR (pink MOST or yellow form);Healthcare Power of [employer] of facility DNR (pink MOST or yellow form);Healthcare Power of Attorney Living will;Out of facility DNR (pink MOST or yellow form)  Does patient want to make changes to medical advance directive? No - Patient declined No - Patient declined No - Patient declined - No - Patient declined No - Patient declined No - Patient declined  Copy of [place] in Chart? No - copy requested - - - Yes - validated most recent copy scanned in chart (See row information) - -  Would patient like information on creating a medical advance directive? - - - - - - -  Pre-existing out of facility DNR order (yellow form or pink MOST form) Pink MOST form placed in chart (order not valid for inpatient use);Yellow form placed in chart (order not valid for inpatient use) - - - Pink MOST/Yellow Form most recent copy in chart - Physician notified to receive inpatient order - -    Current Medications (verified) Outpatient Encounter Medications as of 11/14/2019  Medication Sig   buPROPion (WELLBUTRIN) 100 MG tablet Take 1 tablet by mouth twice daily   Cranberry-Vitamin C (AZO CRANBERRY URINARY TRACT PO) Take by mouth.   cyanocobalamin (,VITAMIN B-12,) 1000  MCG/ML injection Inject 1 mL (1,000 mcg total) into the muscle every 14 (fourteen) days.   Garlic (GARLIQUE PO) Take by mouth every other day.   lisinopril (ZESTRIL) 10 MG tablet Take 1 tablet by mouth once daily (Patient taking differently: Take 10 mg by mouth at bedtime. )   RESVERATROL PO Take 100 mg by mouth daily.    thyroid (ARMOUR) 90 MG tablet Take 90 mg by mouth daily.   TURMERIC CURCUMIN PO Take 2 tablets by mouth daily.    UNABLE TO FIND [place] Bone take once daily   UNABLE TO FIND [place] take 4 tablets once daily   UNABLE TO FIND Comprehensive immune support take 2 tablets once daily   UNABLE TO FIND Modified citrus pectin take 4 tablets once daily   UNABLE TO FIND Celery seed take 2 tablets by mouth once daily   UNABLE TO FIND Maxivision multiple vitamin for eyes   [DISCONTINUED] NP THYROID 90 MG tablet Take 90 mg by mouth daily.    Cyanocobalamin-Methylcobalamin 600-600 MCG SUBL Place under the tongue daily.   Multiple Vitamins-Minerals (ICAPS AREDS 2 PO) Take 2 tablets by mouth daily.   [DISCONTINUED] Cholecalciferol 100 MCG (4000 UT) CAPS Take 1 capsule by mouth every other day. VIT D3    [DISCONTINUED] Coenzyme Q10 (COQ10) 100 MG CAPS Take 100 mg by mouth daily. Take one daily   [DISCONTINUED] fish oil-omega-3 fatty acids 1000 MG capsule Take 2 g by mouth daily. Name:  Natural Factors Omega-3 (Patient not taking: Reported  on 09/06/2019)   [DISCONTINUED] UNABLE TO FIND every other day. Mercie Eon take once daily - Vitamin E   No facility-administered encounter medications on file as of 11/14/2019.    Allergies (verified) Cefixime, Contrast media [iodinated diagnostic agents], Macrobid [nitrofurantoin macrocrystal], Nsaids, and Cephalosporins   History: Past Medical History:  Diagnosis Date   Age-related macular degeneration, dry, both eyes    Aortitis syndrome ([place]) rheumotologist-  dr syed   IgG4 syndrome--  (effects abdomine) ,  previously  long term use prednisone last taken 07/ 2021   Basal cell carcinoma (BCC) of eye    CKD (chronic kidney disease), stage III [facility])    nephrologist-- dr Johnette Abraham. [name]   Complication of anesthesia    post op acute urinary retention   DDD (degenerative disc disease)    Fatigue    pt evalulation by cardiology--- dr Fransico Michael, note 05-21-2019 in epic,(non cardiac)  normal echo and event monitor showed extra beats of atrial tachycardia , no atrial fib, and rare ecotpy   Full dentures    GERD (gastroesophageal reflux disease)    History of basal cell carcinoma (BCC) excision    12-25-2018 MOH's reconstruction left lower eyelid   History of external beam radiation therapy 2002   prostate cancer  and boost with radioative prostate seed implants   History of prostate cancer DX  2002   S/P EXTERNAL RADIATION/ RADIOACTIVE SEED IMPLANTS  2003   History of squamous cell carcinoma excision    2012--  RIGHT LOWER EXTREM;  2019 LEFT LOWER EXTREMITIY   HTN (hypertension)    followed by pcp   Hyperlipidemia    Hypothyroidism    followed by pcp--- acquired atrophy due to radiation;  previously seen by endocrinologist-  dr Chalmers Cater--- per pt takes his own thyroid med. called NP Thyroid 90 mg daily, does not take synthroid   Large vessel vasculitis ([place])    rheumotologist-  dr syed --- IgG4 related aoritis involving renal arteries ,  pt treated with predisone.  (09-06-2019  per pt no longer prednisone last taken one month ago 07/ 2021, weaned off by pcp per pt request)   Left renal artery stenosis [facility])    nephrologist--- dr [name]--- with aneurysm proximal    Lymphedema of left leg    followed by [facility]---- pt stated uses compression hose   OSA (obstructive sleep apnea) 09-06-2019  per pt lasted used cpap approx. 2019, stated felt he no longer needed    pt retested 11-13-2016 at [facility] w/ dr ather-- moderate to severe OSA , AHI 16.4/h/  03-24-2017    Pulmonary nodule  followed by [facility]   per CT 06-11-2019  in [place] in epic done at [place],  LUL nodule , not mets   Renal cyst, right    Restless legs syndrome (RLS)    S/P radiation therapy     for tonsillar cancer (head and neck) completed 01-26-2012 at [facility]);   and XRT for right lower leg sarcoma --- completed 06-06-2017   Saliva decreased    Sarcoma of lower extremity, right ([place]) oncolgoist-  dr Juluis Rainier ([facility]) & Dr Viona Gilmore. 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+) (Stage IVA)---- dx oct 2013  ----s/p left tonsillectomy and concurent chemo and radiation/  ended 01-26-2012-- no surgical intervention---  residuals ( dry mouth, decreased saliva)   oncologist at [place]--  dr brizel--  [place]   Urethral false passage 07/2019   Urethral stricture urologist--- dr Jeffie Pollock   chronic---  post urethral dilation's   Urinary retention with incomplete bladder emptying    Vitamin B 12 deficiency 01/28/2011   Vitamin D deficiency    Past Surgical History:  Procedure Laterality Date   BALLOON DILATION N/A 11/10/2014   Procedure: CYSTO BALLOON DILATION AND RETROGRADE URETHROGRAM ;  Surgeon: Bjorn Loser, MD;  Location: [facility];  Service: Urology;  Laterality: N/A;   CATARACT EXTRACTION W/ INTRAOCULAR LENS  IMPLANT, BILATERAL  2012  approx   CYSTO/ BALLOON DILATION OF  URETHRAL STRICTURE  12-25-2010   CYSTOSCOPY WITH RETROGRADE URETHROGRAM N/A 03/30/2016   Procedure: CYSTOSCOPY WITH RETROGRADE URETHROGRAM AND BALLOON DILATION with cystogram;  Surgeon: Bjorn Loser, MD;  Location: [facility];  Service:  Urology;  Laterality: N/A;   CYSTOSCOPY WITH URETHRAL DILATATION  05/31/2011   Procedure: CYSTOSCOPY WITH URETHRAL DILATATION;  Surgeon: Reece Packer, MD;  Location: [place];  Service: Urology;  Laterality: N/A;  BALLOON DILATION   CYSTOSCOPY WITH URETHRAL DILATATION N/A 09/13/2016   Procedure: CYSTOSCOPY WITH URETHRAL BALLOON DILATATION;  Surgeon: Bjorn Loser, MD;  Location: [place];  Service: Urology;  Laterality: N/A;   CYSTOSCOPY WITH URETHRAL DILATATION N/A 03/30/2017   Procedure: CYSTOSCOPY WITH BALLOON URETHRAL DILATATION;  Surgeon: Bjorn Loser, MD;  Location: [place];  Service: Urology;  Laterality: N/A;   CYSTOSCOPY WITH URETHRAL DILATATION N/A 09/12/2019   Procedure: CYSTOSCOPY WITH URETHRAL DILATATION;  Surgeon: Irine Seal, MD;  Location[facility];  Service: Urology;  Laterality: N/A;   CYSTOSCOPY/RETROGRADE/URETEROSCOPY N/A 05/22/2012   Procedure: CYSTOSCOPY BALLOON DILATION RETROGRADE URETEROGRAM ;  Surgeon: Reece Packer, MD;  Location: [place];  Service: Urology;  Laterality: N/A;   CYTSO/  DILATATION URETHRAL STRICTURE/  BX PROSTATIC URETHRA/  REMOVAL FOREIGN BODIES  06-25-2010   [facility]   ECTROPION REPAIR Left 06/18/2019   lower eyelid   INGUINAL HERNIA REPAIR Right 2000   MOHS SURGERY  01/ 2019    [facility]   left lower leg for [facility]   MOHS SURGERY  12/25/2018   for [facility] of left lower eyelid;  the second stage Hughs flap release 01-09-2019   RADICAL RESECTION OF SARCOMA TUMOR   03-15-2017   [facility]   RIGHT LOWER LEG , CALF AREA   RADIOACTIVE SEED IMPLANTS, PROSTATE  JAN  2003   SKIN LESION EXCISION  07/2012   MOST  right shoulder   TONSILLECTOMY Left 11-10-2011  @[facility]    tonsillar cancer   TRANSURETHRAL INCISION OF BLADDER NECK N/A 09/12/2019   Procedure: TRANSURETHRAL INCISION OF BLADDER NECK;  Surgeon: Irine Seal, MD;  Location: [place];   Service: Urology;  Laterality: N/A;   Family History  Problem Relation Age of Onset   Stroke Father    Social History   Socioeconomic History   Marital status: Married    Spouse name: Not on file   Number of children: 4   Years of education: college   Highest education level: Not on file  Occupational History   Occupation: retired  Tobacco Use  Smoking status: Never Smoker   Smokeless tobacco: Never Used  Vaping Use   Vaping Use: Never used  Substance and Sexual Activity   Alcohol use: Yes    Alcohol/week: 7.0 standard drinks    Types: 7 Glasses of wine per week    Comment: 8oz, glass or red wine   Drug use: No   Sexual activity: Not on file  Other Topics Concern   Not on file  Social History Narrative   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    Social Determinants of Health   Financial Resource Strain:    Difficulty of Paying Living Expenses: Not on file  Food Insecurity:    Worried About Charity fundraiser in the Last Year: Not on file   [employer] of Food in the Last Year: Not on file  Transportation Needs:    Lack of Transportation (Medical): Not on file   Lack of Transportation (Non-Medical): Not on file  Physical Activity:    Days of Exercise per Week: Not on file   Minutes of Exercise per Session: Not on file  Stress:    Feeling of Stress : Not on file  Social Connections:    Frequency of Communication with Friends and Family: Not on file   Frequency of Social Gatherings with Friends and Family: Not on file   Attends Religious Services: Not on file   Active Member of Clubs or Organizations: Not on file   Attends Archivist Meetings: Not on file   Marital Status: Not on file    Tobacco Counseling Counseling given: Not Answered   Clinical Intake:  Pre-visit preparation completed: Yes  Pain : No/denies pain     BMI - recorded:  22 Nutritional Status: BMI of 19-24  Normal Nutritional Risks: Other (Comment) (cancer)  How often do you need to have someone help you when you read instructions, pamphlets, or other written materials from your doctor or pharmacy?: 1 - Never  Diabetic?no         Activities of Daily Living In your present state of health, do you have any difficulty performing the following activities: 11/14/2019 09/12/2019  Hearing? N N  Vision? Y N  Comment if reading small print -  Difficulty concentrating or making decisions? N N  Walking or climbing stairs? N Y  Dressing or bathing? N N  Doing errands, shopping? N -  Preparing Food and eating ? N -  Using the Toilet? N -  In the past six months, have you accidently leaked urine? N -  Do you have problems with loss of bowel control? N -  Managing your Medications? N -  Managing your Finances? N -  Housekeeping or managing your Housekeeping? N -  Some recent data might be hidden    Patient Care Team: Gayland Curry, DO as PCP - General (Geriatric Medicine) Bjorn Loser, MD as Consulting Physician (Urology) [facility] Brizel, Youlanda Roys, MD (Radiation Oncology) Jacelyn Pi, MD as Consulting Physician (Endocrinology) Cherre Blanc Mayford Knife, MD as Referring Physician (Orthopedic Surgery)  Indicate any recent Medical Services you may have received from other than [facility] providers in the past year (date may be approximate).     Assessment:   This is a routine wellness examination for [place].  Hearing/Vision screen  Hearing Screening   125Hz  250Hz  500Hz  1000Hz  2000Hz  3000Hz  4000Hz  6000Hz  8000Hz   Right ear:  Left ear:           Comments: Patient states he has a problem understanding people who don't speak clearly. Patient wears hearing aids.  Vision Screening Comments: Patient states he has no vision problems, but wears glasses. Patient being seen for macular degeneration.  Dietary issues and  exercise activities discussed: Current Exercise Habits: The patient does not participate in regular exercise at present, Exercise limited by: Other - see comments (cancer)  Goals     Increase physical activity     maintain health     Maintain healthy lifestyle with proper diet and exercise.       Depression Screen PHQ 2/9 Scores 11/14/2019 01/30/2019 11/08/2018 05/02/2018 12/20/2017 11/01/2017 08/08/2017  PHQ - 2 Score 0 0 0 0 0 0 0    Fall Risk Fall Risk  11/14/2019 09/04/2019 08/07/2019 03/20/2019 01/30/2019  Falls in the past year? 0 0 0 0 0  Number falls in past yr: 0 0 0 0 0  Injury with Fall? 0 0 0 0 0  Comment - - - - -    Any stairs in or around the home? No  If so, are there any without handrails? No  Home free of loose throw rugs in walkways, pet beds, electrical cords, etc? Yes  Adequate lighting in your home to reduce risk of falls? Yes   ASSISTIVE DEVICES UTILIZED TO PREVENT FALLS:  Life alert? No  Use of a cane, walker or w/c? No  Grab bars in the bathroom? Yes  Shower chair or bench in shower? No  Elevated toilet seat or a handicapped toilet? No   TIMED UP AND GO:  Was the test performed? No .    Cognitive Function: MMSE - Mini Mental State Exam 08/08/2017 08/02/2016 05/20/2015  Orientation to time 5 4 5   Orientation to Place 5 5 5   Registration 3 3 3   Attention/ Calculation 5 5 5   Recall 3 3 3   Language- name 2 objects 2 2 2   Language- repeat 1 1 1   Language- follow 3 step command 3 3 3   Language- read & follow direction 1 1 1   Write a sentence 1 1 1   Copy design 1 1 1   Total score 30 29 30      6CIT Screen 11/14/2019 11/08/2018  What Year? 0 points 0 points  What month? 0 points 0 points  What time? 0 points 0 points  Count back from 20 0 points 0 points  Months in reverse 0 points 0 points  Repeat phrase 0 points 0 points  Total Score 0 0    Immunizations Immunization History  Administered Date(s) Administered   Moderna SARS-COVID-2  Vaccination 01/29/2019, 02/27/2019   Pneumococcal Conjugate-13 05/06/2015   Pneumococcal Polysaccharide-23 01/18/2003   Td 01/18/2003    TDAP status: Due, Education has been provided regarding the importance of this vaccine. Advised may receive this vaccine at local pharmacy or Health Dept. Aware to provide a copy of the vaccination record if obtained from local pharmacy or Health Dept. Verbalized acceptance and understanding. Flu Vaccine status: Declined, Education has been provided regarding the importance of this vaccine but patient still declined. Advised may receive this vaccine at local pharmacy or Health Dept. Aware to provide a copy of the vaccination record if obtained from local pharmacy or Health Dept. Verbalized acceptance and understanding. Pneumococcal vaccine status: Up to date Covid-19 vaccine status: Completed vaccines  Qualifies for Shingles Vaccine? Yes   Zostavax completed No   Shingrix Completed?:  No.    Education has been provided regarding the importance of this vaccine. Patient has been advised to call insurance company to determine out of pocket expense if they have not yet received this vaccine. Advised may also receive vaccine at local pharmacy or Health Dept. Verbalized acceptance and understanding.  Screening Tests Health Maintenance  Topic Date Due   INFLUENZA VACCINE  08/18/2019   TETANUS/TDAP  08/06/2020 (Originally 01/17/2013)   COVID-19 Vaccine  Completed   PNA vac Low Risk Adult  Completed    Health Maintenance  Health Maintenance Due  Topic Date Due   INFLUENZA VACCINE  08/18/2019    Colorectal cancer screening: No longer required.   Lung Cancer Screening: (Low Dose CT Chest recommended if Age 43-80 years, 30 pack-year currently smoking OR have quit w/in 15years.) does not qualify.   Lung Cancer Screening Referral: na  Additional Screening:  Hepatitis C Screening: does not qualify; Completed na  Vision Screening: Recommended annual  ophthalmology exams for early detection of glaucoma and other disorders of the eye. Is the patient up to date with their annual eye exam?  Yes  Who is the provider or what is the name of the office in which the patient attends annual eye exams? Dr Zadie Rhine If pt is not established with a provider, would they like to be referred to a provider to establish care? No .   Dental Screening: Recommended annual dental exams for proper oral hygiene  Community Resource Referral / Chronic Care Management: CRR required this visit?  No   CCM required this visit?  No      Plan:     I have personally reviewed and noted the following in the patients chart:    Medical and social history  Use of alcohol, tobacco or illicit drugs   Current medications and supplements  Functional ability and status  Nutritional status  Physical activity  Advanced directives  List of other physicians  Hospitalizations, surgeries, and ER visits in previous 12 months  Vitals  Screenings to include cognitive, depression, and falls  Referrals and appointments  In addition, I have reviewed and discussed with patient certain preventive protocols, quality metrics, and best practice recommendations. A written personalized care plan for preventive services as well as general preventive health recommendations were provided to patient.     Lauree Chandler, NP   11/14/2019    Virtual Visit via Telephone Note  I connected with@ on 11/14/19 at  2:15 PM EDT by telephone and verified that I am speaking with the correct person using two identifiers.  Location: Patient: home Provider: [place]   I discussed the limitations, risks, security and privacy concerns of performing an evaluation and management service by telephone and the availability of in person appointments. I also discussed with the patient that there may be a patient responsible charge related to this service. The patient expressed understanding and  agreed to proceed.   I discussed the assessment and treatment plan with the patient. The patient was provided an opportunity to ask questions and all were answered. The patient agreed with the plan and demonstrated an understanding of the instructions.   The patient was advised to call back or seek an in-person evaluation if the symptoms worsen or if the condition fails to improve as anticipated.  I provided 16 minutes of non-face-to-face time during this encounter.  Carlos American. Harle Battiest Avs printed and mailed

## 2019-11-14 NOTE — Telephone Encounter (Signed)
[name], salvia are scheduled for a virtual visit with your provider today.    Just as we do with appointments in the office, we must obtain your consent to participate.  Your consent will be active for this visit and any virtual visit you may have with one of our providers in the next 365 days.    If you have a MyChart account, I can also send a copy of this consent to you electronically.  All virtual visits are billed to your insurance company just like a traditional visit in the office.  As this is a virtual visit, video technology does not allow for your provider to perform a traditional examination.  This may limit your provider's ability to fully assess your condition.  If your provider identifies any concerns that need to be evaluated in person or the need to arrange testing such as labs, EKG, etc, we will make arrangements to do so.    Although advances in technology are sophisticated, we cannot ensure that it will always work on either your end or our end.  If the connection with a video visit is poor, we may have to switch to a telephone visit.  With either a video or telephone visit, we are not always able to ensure that we have a secure connection.   I need to obtain your verbal consent now.   Are you willing to proceed with your visit today?   Philip [name] has provided verbal consent on 11/14/2019 for a virtual visit (video or telephone).   Carroll Kinds, CMA 11/14/2019  2:29 PM

## 2019-11-25 ENCOUNTER — Other Ambulatory Visit: Payer: Self-pay

## 2019-11-25 ENCOUNTER — Ambulatory Visit (INDEPENDENT_AMBULATORY_CARE_PROVIDER_SITE_OTHER): Payer: Medicare Other | Admitting: Internal Medicine

## 2019-11-25 ENCOUNTER — Ambulatory Visit (HOSPITAL_BASED_OUTPATIENT_CLINIC_OR_DEPARTMENT_OTHER)
Admission: RE | Admit: 2019-11-25 | Discharge: 2019-11-25 | Disposition: A | Discharge status: Home / Self Care | Payer: Medicare Other | Source: Ambulatory Visit | Attending: Internal Medicine | Admitting: Internal Medicine

## 2019-11-25 ENCOUNTER — Encounter: Payer: Self-pay | Admitting: Internal Medicine

## 2019-11-25 VITALS — BP 122/82 | HR 58 | Temp 97.9°F | Ht 71.0 in | Wt 159.0 lb

## 2019-11-25 DIAGNOSIS — M316 Other giant cell arteritis: Secondary | ICD-10-CM | POA: Diagnosis not present

## 2019-11-25 DIAGNOSIS — M7989 Other specified soft tissue disorders: Secondary | ICD-10-CM | POA: Diagnosis not present

## 2019-11-25 DIAGNOSIS — I890 Lymphedema, not elsewhere classified: Secondary | ICD-10-CM | POA: Diagnosis not present

## 2019-11-25 DIAGNOSIS — M25472 Effusion, left ankle: Secondary | ICD-10-CM

## 2019-11-25 DIAGNOSIS — R5382 Chronic fatigue, unspecified: Secondary | ICD-10-CM | POA: Diagnosis not present

## 2019-11-25 NOTE — Progress Notes (Signed)
Location:  [facility] clinic Provider: Arrington Yohe L. Mariea Clonts, D.O., C.M.D.  Code Status: DNR Goals of Care:  Advanced Directives 11/25/2019  Does Patient Have a Medical Advance Directive? Yes  Type of Paramedic of [place];Out of facility DNR (pink MOST or yellow form);Living will  Does patient want to make changes to medical advance directive? No - Patient declined  Copy of [place] in Chart? Yes - validated most recent copy scanned in chart (See row information)  Would patient like information on creating a medical advance directive? -  Pre-existing out of facility DNR order (yellow form or pink MOST form) -     Chief Complaint  Patient presents with  . Acute Visit    Fatique and leg swelling x 5 days   . Health Maintenance    Influenza he does not want     HPI: Patient is a 86 y.o. male seen today for an acute visit for increased fatigue and leg swelling the last 5 days.  Tues night or wed night, he woke up shortly after going to sleep and his foot was really hurting.  3am, the foot was very red, and the ankle was swollen.  Ankle was where pain was coming from.  Pain and redness went away but the ankle stayed swollen.  They told Moshe Salisbury Fri afternoon.    He'd also had several days of severe fatigue.  Friday, the right arm felt weak and that went away.  Moshe Salisbury contacted his rheumatologist Friday.  Dr. Dossie Der suggested he go to urgent care.  She did put in a Rx for prednisone--started  69m dose Saturday night.    He'd come off of it before.  She also suggested ESR and CRP be done here today. He had been wearing his left compression stocking for the lymphedema.   4/21 echo normal  Past Medical History:  Diagnosis Date  . Age-related macular degeneration, dry, both eyes   . 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 Basal cell carcinoma (BCC) of eye     . CKD (chronic kidney disease), stage III ([facility]    nephrologist-- dr eJohnette Abraham uHollie Salk . Complication of anesthesia    post op acute urinary retention  . DDD (degenerative disc disease)   . Fatigue    pt evalulation by cardiology--- dr tFransico Michael note 05-21-2019 in epic,(non cardiac)  normal echo and event monitor showed extra beats of atrial tachycardia , no atrial fib, and rare ecotpy  . Full dentures   . GERD (gastroesophageal reflux disease)   . History of basal cell carcinoma (BCC) excision    12-25-2018 MOH's reconstruction left lower eyelid  . History of external beam radiation therapy 2002   prostate cancer  and boost with radioative prostate seed implants  . History of prostate cancer DX  2002   S/P EXTERNAL RADIATION/ RADIOACTIVE SEED IMPLANTS  2003  . History of squamous cell carcinoma excision    2012--  RIGHT LOWER EXTREM;  2019 LEFT LOWER EXTREMITIY  . HTN (hypertension)    followed by pcp  . Hyperlipidemia   . Hypothyroidism    followed by pcp--- acquired atrophy due to radiation;  previously seen by endocrinologist-  dr balan--- per pt takes his own thyroid med. called NP Thyroid 90 mg daily, does not take synthroid  . [place] vessel vascul[place]    rheumotologist-  dr syed --- IgG4 related aoritis involving renal arteries ,  pt treated with predisone.  (09-06-2019  per pt no longer prednisone last taken one month ago 07/ 2021, weaned off by pcp per pt request)  . Left renal artery stenosis [facility])    nephrologist--- dr [name]--- with aneurysm proximal   . Lymphedema of left leg    followed by [facility]---- pt stated uses compression hose  . OSA (obstructive sleep apnea) 09-06-2019  per pt lasted used cpap approx. 2019, stated felt he no longer needed    pt retested 11-13-2016 at [facility] w/ dr ather-- moderate to severe OSA , AHI 16.4/h/  03-24-2017   . Pulmonary nodule followed by [facility]   per CT 06-11-2019  in [place] in epic done  at [place],  LUL nodule , not mets  . Renal cyst, right   . Restless legs syndrome (RLS)   . S/P radiation therapy     for tonsillar cancer (head and neck) completed 01-26-2012 at [facility]);   and XRT for right lower leg sarcoma --- completed 06-06-2017  . Saliva decreased   . Sarcoma of lower extremity, right ([place]) oncolgoist-  dr Juluis Rainier ([facility]) & Dr Viona Gilmore. Cherre Blanc ([facility])   dx 02-14-2017 w/ needle core bx;  high grade pleomorphic spindle cell sarcoma , grade 2 (cT2N0M0); 03-15-2017  radical resection sarcoma tumor right lower leg  and completed radiation 06-06-2017  . Self-catheterizes urinary bladder    QID   AND   PRN  . 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 Urethral false passage 07/2019  . Urethral stricture urologist--- dr Jeffie Pollock   chronic---  post urethral dilation's  . Urinary retention with incomplete bladder emptying   . Vitamin B 12 deficiency 01/28/2011  . Vitamin D deficiency     Past Surgical History:  Procedure Laterality Date  . BALLOON DILATION N/A 11/10/2014   Procedure: CYSTO BALLOON DILATION AND RETROGRADE URETHROGRAM ;  Surgeon: Bjorn Loser, MD;  Location: [facility];  Service: Urology;  Laterality: N/A;  . CATARACT EXTRACTION W/ INTRAOCULAR LENS  IMPLANT, BILATERAL  2012  approx  . CYSTO/ BALLOON DILATION OF  URETHRAL STRICTURE  12-25-2010  . CYSTOSCOPY WITH RETROGRADE URETHROGRAM N/A 03/30/2016   Procedure: CYSTOSCOPY WITH RETROGRADE URETHROGRAM AND BALLOON DILATION with cystogram;  Surgeon: Bjorn Loser, MD;  Location: [facility];  Service: Urology;  Laterality: N/A;  . CYSTOSCOPY WITH URETHRAL DILATATION  05/31/2011    Procedure: CYSTOSCOPY WITH URETHRAL DILATATION;  Surgeon: Reece Packer, MD;  Location: [place];  Service: Urology;  Laterality: N/A;  BALLOON DILATION  . CYSTOSCOPY WITH URETHRAL DILATATION N/A 09/13/2016   Procedure: CYSTOSCOPY WITH URETHRAL BALLOON DILATATION;  Surgeon: Bjorn Loser, MD;  Location: [place];  Service: Urology;  Laterality: N/A;  . CYSTOSCOPY WITH URETHRAL DILATATION N/A 03/30/2017   Procedure: CYSTOSCOPY WITH BALLOON URETHRAL DILATATION;  Surgeon: Bjorn Loser, MD;  Location: [place];  Service: Urology;  Laterality: N/A;  . CYSTOSCOPY WITH URETHRAL DILATATION N/A 09/12/2019   Procedure: CYSTOSCOPY WITH URETHRAL DILATATION;  Surgeon: Irine Seal, MD;  Location: [facility];  Service:  Urology;  Laterality: N/A;  . CYSTOSCOPY/RETROGRADE/URETEROSCOPY N/A 05/22/2012   Procedure: CYSTOSCOPY BALLOON DILATION RETROGRADE URETEROGRAM ;  Surgeon: Reece Packer, MD;  Location: [place];  Service: Urology;  Laterality: N/A;  . CYTSO/  DILATATION URETHRAL STRICTURE/  BX PROSTATIC URETHRA/  REMOVAL FOREIGN BODIES  06-25-2010   [facility]  . ECTROPION REPAIR Left 06/18/2019   lower eyelid  . INGUINAL HERNIA REPAIR Right 2000  . MOHS SURGERY  01/ 2019    [facility]   left lower leg for SCC  . MOHS SURGERY  12/25/2018   for [facility] of left lower eyelid;  the second stage Hughs flap release 01-09-2019  . RADICAL RESECTION OF SARCOMA TUMOR   03-15-2017   [facility]   RIGHT LOWER LEG , CALF AREA  . RADIOACTIVE SEED IMPLANTS, PROSTATE  JAN  2003  . SKIN LESION EXCISION  07/2012   MOST  right shoulder  . TONSILLECTOMY Left 11-10-2011  @[facility]    tonsillar cancer  . TRANSURETHRAL INCISION OF BLADDER NECK N/A 09/12/2019   Procedure: TRANSURETHRAL INCISION OF BLADDER NECK;  Surgeon: Irine Seal, MD;  Location: [facility];  Service: Urology;  Laterality: N/A;    Allergies  Allergen Reactions  .  Cefixime Rash  . Contrast Media [Iodinated Diagnostic Agents] Other (See Comments)    Avoid due to renal disease  . Macrobid [Nitrofurantoin Macrocrystal] Swelling  . Nsaids Other (See Comments)    Has been told no NSAIDs, Ibuprofen Renal insufficiency hx.   . Cephalosporins Rash    Outpatient Encounter Medications as of 11/25/2019  Medication Sig  . buPROPion (WELLBUTRIN) 100 MG tablet Take 1 tablet by mouth twice daily  . Cranberry-Vitamin C (AZO CRANBERRY URINARY TRACT PO) Take by mouth.  . cyanocobalamin (,VITAMIN B-12,) 1000 MCG/ML injection Inject 1 mL (1,000 mcg total) into the muscle every 14 (fourteen) days.  . Cyanocobalamin-Methylcobalamin 600-600 MCG SUBL Place under the tongue daily.  . Garlic (GARLIQUE PO) Take by mouth every other day.  . lisinopril (ZESTRIL) 10 MG tablet Take 1 tablet by mouth once daily (Patient taking differently: Take 10 mg by mouth at bedtime. )  . Multiple Vitamins-Minerals (ICAPS AREDS 2 PO) Take 2 tablets by mouth daily.  Marland Kitchen RESVERATROL PO Take 100 mg by mouth daily.   Marland Kitchen thyroid (ARMOUR) 90 MG tablet Take 90 mg by mouth daily.  . TURMERIC CURCUMIN PO Take 2 tablets by mouth daily.   Marland Kitchen UNABLE TO FIND [place] Bone take once daily  . UNABLE TO FIND [place] take 4 tablets once daily  . UNABLE TO FIND Comprehensive immune support take 2 tablets once daily  . UNABLE TO FIND Modified citrus pectin take 4 tablets once daily  . UNABLE TO FIND Celery seed take 2 tablets by mouth once daily  . UNABLE TO FIND Maxivision multiple vitamin for eyes   No facility-administered encounter medications on file as of 11/25/2019.    Review of Systems:  Review of Systems  Constitutional: Positive for malaise/fatigue. Negative for chills and fever.  HENT: Negative for congestion.   Respiratory: Negative for cough and shortness of breath.   Cardiovascular: Positive for leg swelling. Negative for chest pain and palpitations.       Increased swelling of left ankle    Gastrointestinal: Negative for abdominal pain.  Genitourinary: Negative for dysuria.  Musculoskeletal: Negative for falls and joint pain.       No injury  Neurological: Positive for weakness. Negative for loss of consciousness.  Psychiatric/Behavioral:  Positive for depression.    Health Maintenance  Topic Date Due  . INFLUENZA VACCINE  08/18/2019  . TETANUS/TDAP  08/06/2020 (Originally 01/17/2013)  . COVID-19 Vaccine  Completed  . PNA vac Low Risk Adult  Completed    Physical Exam: Vitals:   11/25/19 1039  BP: 122/82  Pulse: (!) 58  Temp: 97.9 F (36.6 C)  TempSrc: Temporal  SpO2: 97%  Weight: 159 lb (72.1 kg)  Height: 5' 11"  (1.803 m)   Body mass index is 22.18 kg/m. Physical Exam Vitals reviewed.  Constitutional:      General: He is not in acute distress.    Appearance: Normal appearance. He is not toxic-appearing.  HENT:     Head: Normocephalic and atraumatic.  Eyes:     Extraocular Movements: Extraocular movements intact.     Conjunctiva/sclera: Conjunctivae normal.     Pupils: Pupils are equal, round, and reactive to light.  Cardiovascular:     Rate and Rhythm: Normal rate and regular rhythm.  Pulmonary:     Effort: Pulmonary effort is normal.     Breath sounds: Normal breath sounds. No wheezing, rhonchi or rales.  Musculoskeletal:        General: Swelling present. No tenderness, deformity or signs of injury.     Right lower leg: No edema.     Left lower leg: Edema present.     Comments: Left leg has been chronically swollen, but ankle now notably with 2+ edema, nontender, no erythema or warmth  Skin:    General: Skin is warm and dry.     Comments: scaliness of left anterior shin  Neurological:     General: No focal deficit present.     Mental Status: He is alert and oriented to person, place, and time.     Cranial Nerves: No cranial nerve deficit.     Sensory: No sensory deficit.     Motor: No weakness.     Coordination: Coordination normal.      Gait: Gait normal.     Deep Tendon Reflexes: Reflexes normal.  Psychiatric:        Mood and Affect: Mood normal.        Behavior: Behavior normal.     Labs reviewed: Basic Metabolic Panel: Recent Labs    01/31/19 0800 09/12/19 1100  NA 141 142  K 4.7 4.2  CL 102 103  CO2 30*  --   GLUCOSE  --  102*  BUN 25* 21  CREATININE 1.5* 1.40*  CALCIUM 9.2  --   TSH 4.21  --    Liver Function Tests: Recent Labs    01/31/19 0800  ALBUMIN 4.2   No results for input(s): LIPASE, AMYLASE in the last 8760 hours. No results for input(s): AMMONIA in the last 8760 hours. CBC: Recent Labs    01/31/19 0000 09/12/19 1100  WBC 6.2  --   HGB 14.0 14.3  HCT 41 42.0  PLT 233  --    Lipid Panel: No results for input(s): CHOL, HDL, LDLCALC, TRIG, CHOLHDL, LDLDIRECT in the last 8760 hours. No results found for: HGBA1C  Procedures since last visit: OCT, Retina - OU - Both Eyes  Result Date: 11/07/2019 Right Eye Quality was good. Scan locations included subfoveal. Central Foveal Thickness: 274. Progression has been stable. Findings include retinal drusen . Left Eye Quality was good. Scan locations included subfoveal. Central Foveal Thickness: 342. Progression has been stable. Findings include retinal drusen . Notes No signs of CNVM   Assessment/Plan  1. Chronic fatigue -worse the past week -advised to take the prednisone 66m daily as prescribed by Dr. SDossie Der-ESR and CRP drawn today  2. Lymphedema of left leg - baseline, but worse now - has not been wearing stocking, but had been up to the time this worsened ankle swelling developed - VAS [place]OWER EXTREMITY VENOUS (DVT); Future  3. Large vessel vasculitis (HCC) - check labs, take prednisone as directed--he only took one dose, felt better and decided not to keep taking it - Sedimentation rate - C-reactive protein  4. Left ankle swelling - newly worse, initial pain and erythema now gone, just swelling persists - VAS [place]OWER  EXTREMITY VENOUS (DVT); Future--ASAP  Labs/tests ordered:   Lab Orders     Sedimentation rate     C-reactive protein Doppler LLE ASAP   Next appt:  12/11/2019  Schwanda Zima L. Amelia Macken, D.O. [facility] [address] [place] [id number] Phone (Mon-Fri 8am-5pm):  [phone] Call:  [phone]& follow prompts after 5pm & weekends Office Phone:  [phone] Fax:  [phone]

## 2019-11-25 NOTE — Progress Notes (Signed)
No blood clot in left leg.  Next step is to resume regular use of left leg stocking for lymphedema, elevate it at rest.  If swelling persists, follow-up with lymphedema clinic.

## 2019-11-26 LAB — C-REACTIVE PROTEIN: CRP: 23.8 mg/L — ABNORMAL HIGH (ref <8.0)

## 2019-11-26 LAB — SEDIMENTATION RATE: Sed Rate: 29 mm/h — ABNORMAL HIGH (ref 0–20)

## 2019-11-26 NOTE — Progress Notes (Signed)
Sed rate is pretty normal. CRP is elevated.  Please send these results to Dr. Dossie Der.

## 2019-11-29 ENCOUNTER — Telehealth: Payer: Self-pay | Admitting: *Deleted

## 2019-11-29 NOTE — Telephone Encounter (Signed)
Patient called requesting labs to be faxed to Dr. Dossie Der, Rheumatologist at [facility].   Added Provider to Patient's Care Team and Faxed.

## 2019-12-03 DIAGNOSIS — Z85831 Personal history of malignant neoplasm of soft tissue: Secondary | ICD-10-CM | POA: Diagnosis not present

## 2019-12-03 DIAGNOSIS — Z923 Personal history of irradiation: Secondary | ICD-10-CM | POA: Diagnosis not present

## 2019-12-03 DIAGNOSIS — C499 Malignant neoplasm of connective and soft tissue, unspecified: Secondary | ICD-10-CM | POA: Diagnosis not present

## 2019-12-03 DIAGNOSIS — Z08 Encounter for follow-up examination after completed treatment for malignant neoplasm: Secondary | ICD-10-CM | POA: Diagnosis not present

## 2019-12-03 DIAGNOSIS — Z85818 Personal history of malignant neoplasm of other sites of lip, oral cavity, and pharynx: Secondary | ICD-10-CM | POA: Diagnosis not present

## 2019-12-03 DIAGNOSIS — R531 Weakness: Secondary | ICD-10-CM | POA: Diagnosis not present

## 2019-12-03 DIAGNOSIS — R262 Difficulty in walking, not elsewhere classified: Secondary | ICD-10-CM | POA: Diagnosis not present

## 2019-12-03 DIAGNOSIS — R918 Other nonspecific abnormal finding of lung field: Secondary | ICD-10-CM | POA: Diagnosis not present

## 2019-12-03 DIAGNOSIS — R600 Localized edema: Secondary | ICD-10-CM | POA: Diagnosis not present

## 2019-12-04 ENCOUNTER — Encounter: Payer: Self-pay | Admitting: Internal Medicine

## 2019-12-05 DIAGNOSIS — E538 Deficiency of other specified B group vitamins: Secondary | ICD-10-CM | POA: Diagnosis not present

## 2019-12-05 LAB — VITAMIN B12: Vitamin B-12: 543

## 2019-12-11 ENCOUNTER — Encounter: Payer: Self-pay | Admitting: Internal Medicine

## 2019-12-11 ENCOUNTER — Other Ambulatory Visit: Payer: Self-pay

## 2019-12-11 ENCOUNTER — Non-Acute Institutional Stay: Payer: Medicare Other | Admitting: Internal Medicine

## 2019-12-11 VITALS — BP 130/78 | HR 62 | Temp 97.5°F | Ht 71.0 in | Wt 162.4 lb

## 2019-12-11 DIAGNOSIS — G4733 Obstructive sleep apnea (adult) (pediatric): Secondary | ICD-10-CM

## 2019-12-11 DIAGNOSIS — F3341 Major depressive disorder, recurrent, in partial remission: Secondary | ICD-10-CM

## 2019-12-11 DIAGNOSIS — E538 Deficiency of other specified B group vitamins: Secondary | ICD-10-CM

## 2019-12-11 DIAGNOSIS — Z66 Do not resuscitate: Secondary | ICD-10-CM

## 2019-12-11 DIAGNOSIS — Z85831 Personal history of malignant neoplasm of soft tissue: Secondary | ICD-10-CM | POA: Insufficient documentation

## 2019-12-11 DIAGNOSIS — I712 Thoracic aortic aneurysm, without rupture, unspecified: Secondary | ICD-10-CM

## 2019-12-11 DIAGNOSIS — I890 Lymphedema, not elsewhere classified: Secondary | ICD-10-CM

## 2019-12-11 DIAGNOSIS — F4310 Post-traumatic stress disorder, unspecified: Secondary | ICD-10-CM

## 2019-12-11 DIAGNOSIS — M316 Other giant cell arteritis: Secondary | ICD-10-CM

## 2019-12-11 NOTE — Progress Notes (Signed)
Location:  Occupational psychologist of Service:  Clinic (12)  Provider: Majed Pellegrin L. Mariea Clonts, D.O., C.M.D.  Code Status: DNR Goals of Care:  Advanced Directives 11/25/2019  Does Patient Have a Medical Advance Directive? Yes  Type of Paramedic of [place];Out of facility DNR (pink MOST or yellow form);Living will  Does patient want to make changes to medical advance directive? No - Patient declined  Copy of [place] in Chart? Yes - validated most recent copy scanned in chart (See row information)  Would patient like information on creating a medical advance directive? -  Pre-existing out of facility DNR order (yellow form or pink MOST form) -   Chief Complaint  Patient presents with  . Medical Management of Chronic Issues    3 month follow up / legs still aches but is better     HPI: Patient is a 86 y.o. male seen today for medical management of chronic diseases.    Right leg aches on occasion.  If he sits for a while, he limps for a good few yards before it shakes itself out.  One night, he could not sleep b/c of it.  He saw his sarcoma specialist at [facility] and nothing concerning was identified.    He was a little constipated slightly for a while.  He's now having thinner stools.  He took one dose of miralax and hasn't taken it again.    Still has swelling of his left leg.  Did not have the unusual condition.  Pt feels like wearing the stocking does not help.  18.75 and 19 around without or w/o the stocking around his thigh.  He's for another specialty consult about this.   He believes he has PTSD.  He saw a gentleman who does EMDR treatment.  He talks about how he shut down as a bright 86 yo when he was in the bomb shelter by himself.  He did not feel scared but was shut down.  He was shy and wanted to be left alone reading in a corner.  He did well in school so no one bothered him.  He was able to skip a year.  He struggled with  chemistry, ran off from school, joined the [employer], loved flying and he thinks that saved him.  He says his social skills are lacking.  Father was in the house but not really there.  They didn't ever go out.  He did not get programmed politically.  He does still take his wellbutrin twice a day which he feels helps.   The treatment is around rapid eye movement and reprogramming the brain to get rid of the bad stuff.  Used for soldiers coming back from war after trauma.    Past Medical History:  Diagnosis Date  . Age-related macular degeneration, dry, both eyes   . 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 Basal cell carcinoma (BCC) of eye   . CKD (chronic kidney disease), stage III [facility])    nephrologist-- dr Johnette Abraham. [name]  . Complication of anesthesia    post op acute urinary retention  . DDD (degenerative disc disease)   . Fatigue    pt evalulation by cardiology--- dr Fransico Michael, note 05-21-2019 in epic,(non cardiac)  normal echo and event monitor showed extra beats of atrial tachycardia , no atrial fib,  and rare ecotpy  . Full dentures   . GERD (gastroesophageal reflux disease)   . History of basal cell carcinoma (BCC) excision    12-25-2018 MOH's reconstruction left lower eyelid  . History of external beam radiation therapy 2002   prostate cancer  and boost with radioative prostate seed implants  . History of prostate cancer DX  2002   S/P EXTERNAL RADIATION/ RADIOACTIVE SEED IMPLANTS  2003  . History of squamous cell carcinoma excision    2012--  RIGHT LOWER EXTREM;  2019 LEFT LOWER EXTREMITIY  . HTN (hypertension)    followed by pcp  . Hyperlipidemia   . Hypothyroidism    followed by pcp--- acquired atrophy due to radiation;  previously seen by endocrinologist-  dr balan--- per pt takes his own thyroid med. called NP Thyroid 90 mg daily, does not take synthroid  . Large  vessel vasculitis ([place])    rheumotologist-  dr syed --- IgG4 related aoritis involving renal arteries ,  pt treated with predisone.  (09-06-2019  per pt no longer prednisone last taken one month ago 07/ 2021, weaned off by pcp per pt request)  . Left renal artery stenosis [facility])    nephrologist--- dr [name]--- with aneurysm proximal   . Lymphedema of left leg    followed by [facility]---- pt stated uses compression hose  . OSA (obstructive sleep apnea) 09-06-2019  per pt lasted used cpap approx. 2019, stated felt he no longer needed    pt retested 11-13-2016 at [facility] w/ dr ather-- moderate to severe OSA , AHI 16.4/h/  03-24-2017   . Pulmonary nodule followed by [facility]   per CT 06-11-2019  in [place] in epic done at [place],  LUL nodule , not mets  . Renal cyst, right   . Restless legs syndrome (RLS)   . S/P radiation therapy     for tonsillar cancer (head and neck) completed 01-26-2012 at [facility]);   and XRT for right lower leg sarcoma --- completed 06-06-2017  . Saliva decreased   . Sarcoma of lower extremity, right ([place]) oncolgoist-  dr Juluis Rainier ([facility]) & Dr Viona Gilmore. Cherre Blanc ([facility])   dx 02-14-2017 w/ needle core bx;  high grade pleomorphic spindle cell sarcoma , grade 2 (cT2N0M0); 03-15-2017  radical resection sarcoma tumor right lower leg  and completed radiation 06-06-2017  . Self-catheterizes urinary bladder    QID   AND   PRN  . Tonsillar cancer ([place]) 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+) (Stage IVA)---- dx oct 2013  ----s/p left tonsillectomy and concurent chemo and radiation/  ended 01-26-2012-- no surgical intervention---  residuals ( dry mouth, decreased saliva)   oncologist at [place]--  dr brizel--  HX OF -- NO RECURRENCE  . Urethral false passage 07/2019  . Urethral stricture urologist--- dr Jeffie Pollock   chronic---  post urethral dilation's  . Urinary retention with incomplete bladder emptying   . Vitamin B  12 deficiency 01/28/2011  . Vitamin D deficiency     Past Surgical History:  Procedure Laterality Date  . BALLOON DILATION N/A 11/10/2014   Procedure: CYSTO BALLOON DILATION AND RETROGRADE URETHROGRAM ;  Surgeon: Bjorn Loser, MD;  Location: [facility];  Service: Urology;  Laterality: N/A;  . CATARACT EXTRACTION W/ INTRAOCULAR LENS  IMPLANT, BILATERAL  2012  approx  . CYSTO/ BALLOON DILATION OF  URETHRAL STRICTURE  12-25-2010  . CYSTOSCOPY WITH RETROGRADE URETHROGRAM N/A 03/30/2016  Procedure: CYSTOSCOPY WITH RETROGRADE URETHROGRAM AND BALLOON DILATION with cystogram;  Surgeon: Bjorn Loser, MD;  Location: [facility];  Service: Urology;  Laterality: N/A;  . CYSTOSCOPY WITH URETHRAL DILATATION  05/31/2011   Procedure: CYSTOSCOPY WITH URETHRAL DILATATION;  Surgeon: Reece Packer, MD;  Location: [place];  Service: Urology;  Laterality: N/A;  BALLOON DILATION  . CYSTOSCOPY WITH URETHRAL DILATATION N/A 09/13/2016   Procedure: CYSTOSCOPY WITH URETHRAL BALLOON DILATATION;  Surgeon: Bjorn Loser, MD;  Location: [place];  Service: Urology;  Laterality: N/A;  . CYSTOSCOPY WITH URETHRAL DILATATION N/A 03/30/2017   Procedure: CYSTOSCOPY WITH BALLOON URETHRAL DILATATION;  Surgeon: Bjorn Loser, MD;  Location: [place];  Service: Urology;  Laterality: N/A;  . CYSTOSCOPY WITH URETHRAL DILATATION N/A 09/12/2019   Procedure: CYSTOSCOPY WITH URETHRAL DILATATION;  Surgeon: Irine Seal, MD;  Location: [facility];  Service: Urology;  Laterality: N/A;  . CYSTOSCOPY/RETROGRADE/URETEROSCOPY N/A 05/22/2012   Procedure: CYSTOSCOPY BALLOON DILATION RETROGRADE URETEROGRAM ;  Surgeon: Reece Packer, MD;  Location: [place];  Service: Urology;  Laterality: N/A;  . CYTSO/  DILATATION URETHRAL STRICTURE/  BX PROSTATIC URETHRA/  REMOVAL FOREIGN BODIES  06-25-2010   [facility]  . ECTROPION  REPAIR Left 06/18/2019   lower eyelid  . INGUINAL HERNIA REPAIR Right 2000  . MOHS SURGERY  01/ 2019    [facility]   left lower leg for SCC  . MOHS SURGERY  12/25/2018   for [facility] of left lower eyelid;  the second stage Hughs flap release 01-09-2019  . RADICAL RESECTION OF SARCOMA TUMOR   03-15-2017   [facility]   RIGHT LOWER LEG , CALF AREA  . RADIOACTIVE SEED IMPLANTS, PROSTATE  JAN  2003  . SKIN LESION EXCISION  07/2012   MOST  right shoulder  . TONSILLECTOMY Left 11-10-2011  @[facility]    tonsillar cancer  . TRANSURETHRAL INCISION OF BLADDER NECK N/A 09/12/2019   Procedure: TRANSURETHRAL INCISION OF BLADDER NECK;  Surgeon: Irine Seal, MD;  Location: [facility];  Service: Urology;  Laterality: N/A;    Allergies  Allergen Reactions  . Cefixime Rash  . Contrast Media [Iodinated Diagnostic Agents] Other (See Comments)    Avoid due to renal disease  . Macrobid [Nitrofurantoin Macrocrystal] Swelling  . Nsaids Other (See Comments)    Has been told no NSAIDs, Ibuprofen Renal insufficiency hx.   . Cephalosporins Rash    Outpatient Encounter Medications as of 12/11/2019  Medication Sig  . buPROPion (WELLBUTRIN) 100 MG tablet Take 1 tablet by mouth twice daily  . Cranberry-Vitamin C (AZO CRANBERRY URINARY TRACT PO) Take by mouth.  . cyanocobalamin (,VITAMIN B-12,) 1000 MCG/ML injection Inject 1 mL (1,000 mcg total) into the muscle every 14 (fourteen) days.  . Cyanocobalamin-Methylcobalamin 600-600 MCG SUBL Place under the tongue daily.  . Garlic (GARLIQUE PO) Take by mouth every other day.  . lisinopril (ZESTRIL) 10 MG tablet Take 1 tablet by mouth once daily  . Multiple Vitamins-Minerals (ICAPS AREDS 2 PO) Take 2 tablets by mouth daily.  Marland Kitchen RESVERATROL PO Take 100 mg by mouth daily.   Marland Kitchen thyroid (ARMOUR) 90 MG tablet Take 90 mg by mouth daily.  . TURMERIC CURCUMIN PO Take 2 tablets by mouth daily.   Marland Kitchen UNABLE TO FIND [place] take once daily  . UNABLE TO FIND [place] take 4  tablets once daily  . UNABLE TO FIND Comprehensive immune support take 2 tablets once daily  .  UNABLE TO FIND Modified citrus pectin take 4 tablets once daily  . UNABLE TO FIND Celery seed take 2 tablets by mouth once daily  . UNABLE TO FIND Maxivision multiple vitamin for eyes   No facility-administered encounter medications on file as of 12/11/2019.    Review of Systems:  Review of Systems  Constitutional: Positive for malaise/fatigue. Negative for chills and fever.       Fatigue has improved from last visit at [place]: Positive for hearing loss. Negative for congestion and sore throat.   Eyes:       Macular degeneration with diplopia when looks to the right  Respiratory: Negative for cough and shortness of breath.   Cardiovascular: Positive for leg swelling. Negative for chest pain and palpitations.       Left leg swelling  Gastrointestinal: Positive for constipation. Negative for abdominal pain.       Is going to try daily miralax for a bit  Genitourinary: Negative for dysuria.  Musculoskeletal: Negative for back pain, falls and joint pain.  Skin: Negative for itching and rash.  Neurological: Negative for dizziness and loss of consciousness.  Psychiatric/Behavioral: Positive for depression. Negative for memory loss. The patient is not nervous/anxious and does not have insomnia.        Sleeping a bit better lately since b12 repleted (he associates these)    Health Maintenance  Topic Date Due  . INFLUENZA VACCINE  08/18/2019  . TETANUS/TDAP  08/06/2020 (Originally 01/17/2013)  . COVID-19 Vaccine  Completed  . PNA vac Low Risk Adult  Completed    Physical Exam: Vitals:   12/11/19 0949  BP: 130/78  Pulse: 62  Temp: (!) 97.5 F (36.4 C)  TempSrc: Temporal  SpO2: 99%  Weight: 162 lb 6.4 oz (73.7 kg)  Height: 5' 11"  (1.803 m)   Body mass index is 22.65 kg/m. Physical Exam Vitals reviewed.  Constitutional:      General: He is not in acute distress.    Appearance:  Normal appearance. He is not ill-appearing or toxic-appearing.  HENT:     Head: Normocephalic and atraumatic.  Cardiovascular:     Rate and Rhythm: Normal rate and regular rhythm.     Pulses: Normal pulses.     Heart sounds: Normal heart sounds.  Pulmonary:     Effort: Pulmonary effort is normal.     Breath sounds: Normal breath sounds. No wheezing, rhonchi or rales.  Abdominal:     General: Bowel sounds are normal.  Musculoskeletal:        General: No tenderness.     Right lower leg: No edema.     Left lower leg: Edema present.  Neurological:     General: No focal deficit present.     Mental Status: He is alert and oriented to person, place, and time.     Motor: No weakness.     Gait: Gait normal.  Psychiatric:        Mood and Affect: Mood normal.        Behavior: Behavior normal.     Labs reviewed: Basic Metabolic Panel: Recent Labs    01/31/19 0800 09/12/19 1100  NA 141 142  K 4.7 4.2  CL 102 103  CO2 30*  --   GLUCOSE  --  102*  BUN 25* 21  CREATININE 1.5* 1.40*  CALCIUM 9.2  --   TSH 4.21  --    Liver Function Tests: Recent Labs    01/31/19 0800  ALBUMIN 4.2  No results for input(s): LIPASE, AMYLASE in the last 8760 hours. No results for input(s): AMMONIA in the last 8760 hours. CBC: Recent Labs    01/31/19 0000 09/12/19 1100  WBC 6.2  --   HGB 14.0 14.3  HCT 41 42.0  PLT 233  --    Lipid Panel: No results for input(s): CHOL, HDL, LDLCALC, TRIG, CHOLHDL, LDLDIRECT in the last 8760 hours. No results found for: HGBA1C  Procedures since last visit: US Venous Img Lower Unilateral Left (DVT)  Result Date: 11/25/2019 CLINICAL DATA:  Left leg swelling EXAM: LEFT LOWER EXTREMITY VENOUS DOPPLER ULTRASOUND TECHNIQUE: Gray-scale sonography with graded compression, as well as color Doppler and duplex ultrasound were performed to evaluate the lower extremity deep venous systems from the level of the common femoral vein and including the common femoral,  femoral, profunda femoral, popliteal and calf veins including the posterior tibial, peroneal and gastrocnemius veins when visible. The superficial great saphenous vein was also interrogated. Spectral Doppler was utilized to evaluate flow at rest and with distal augmentation maneuvers in the common femoral, femoral and popliteal veins. COMPARISON:  None. FINDINGS: Contralateral Common Femoral Vein: Respiratory phasicity is normal and symmetric with the symptomatic side. No evidence of thrombus. Normal compressibility. Common Femoral Vein: No evidence of thrombus. Normal compressibility, respiratory phasicity and response to augmentation. Saphenofemoral Junction: No evidence of thrombus. Normal compressibility and flow on color Doppler imaging. Profunda Femoral Vein: No evidence of thrombus. Normal compressibility and flow on color Doppler imaging. Femoral Vein: No evidence of thrombus. Normal compressibility, respiratory phasicity and response to augmentation. Popliteal Vein: No evidence of thrombus. Normal compressibility, respiratory phasicity and response to augmentation. Calf Veins: No evidence of thrombus. Normal compressibility and flow on color Doppler imaging. Superficial Great Saphenous Vein: No evidence of thrombus. Normal compressibility. Venous Reflux:  None. Other Findings:  None. IMPRESSION: No evidence of deep venous thrombosis. Electronically Signed   By: Inez Catalina M.D.   On: 11/25/2019 14:10    Assessment/Plan 1. Lymphedema of left leg -persists -Dr. Cherre Blanc is not convinced this is what he has -has been referred to another specialist in [place] compression stocking   2. Large vessel vasculitis (HCC) -on prednisone taper now from Dr. Dossie Der after ESR, CRP were normal (initially was put back on higher dose that he would not take until labs done)  3. OSA (obstructive sleep apnea) -still does not wear his cpap  4. History of sarcoma -right leg with stable CT chest with pulmonary  nodules--followed at [facility] by Dr. Cherre Blanc  5. Thoracic aortic aneurysm without rupture ([place]) -had imaging at [facility] and stable  6. Recurrent major depressive disorder, in partial remission ([place]) -cont wellbutrin therapy, doing fine  7. B12 deficiency -cont b12 injections he's doing himself -level finally over 500  Labs/tests ordered:  No new this time Next appt:  4 mos  Heavyn Yearsley L. Zeina Akkerman, D.O. [facility] [address], [place] [address] Cell Phone (Mon-Fri 8am-5pm):  [phone] On Call:  [phone] & follow prompts after 5pm & weekends Office Phone:  [phone] Office Fax:  [phone]

## 2019-12-16 DIAGNOSIS — H353134 Nonexudative age-related macular degeneration, bilateral, advanced atrophic with subfoveal involvement: Secondary | ICD-10-CM | POA: Diagnosis not present

## 2019-12-16 DIAGNOSIS — H43813 Vitreous degeneration, bilateral: Secondary | ICD-10-CM | POA: Diagnosis not present

## 2019-12-16 DIAGNOSIS — Z961 Presence of intraocular lens: Secondary | ICD-10-CM | POA: Diagnosis not present

## 2019-12-18 ENCOUNTER — Other Ambulatory Visit: Payer: Self-pay | Admitting: Internal Medicine

## 2019-12-18 NOTE — Telephone Encounter (Signed)
rx sent to pharmacy by e-script  

## 2019-12-25 DIAGNOSIS — L821 Other seborrheic keratosis: Secondary | ICD-10-CM | POA: Diagnosis not present

## 2019-12-25 DIAGNOSIS — L570 Actinic keratosis: Secondary | ICD-10-CM | POA: Diagnosis not present

## 2020-01-06 DIAGNOSIS — Z7952 Long term (current) use of systemic steroids: Secondary | ICD-10-CM | POA: Diagnosis not present

## 2020-01-06 DIAGNOSIS — R5383 Other fatigue: Secondary | ICD-10-CM | POA: Diagnosis not present

## 2020-01-06 DIAGNOSIS — I776 Arteritis, unspecified: Secondary | ICD-10-CM | POA: Diagnosis not present

## 2020-01-06 DIAGNOSIS — E039 Hypothyroidism, unspecified: Secondary | ICD-10-CM | POA: Diagnosis not present

## 2020-01-23 ENCOUNTER — Encounter (INDEPENDENT_AMBULATORY_CARE_PROVIDER_SITE_OTHER): Payer: Self-pay

## 2020-01-23 DIAGNOSIS — Z85828 Personal history of other malignant neoplasm of skin: Secondary | ICD-10-CM | POA: Diagnosis not present

## 2020-01-23 DIAGNOSIS — H019 Unspecified inflammation of eyelid: Secondary | ICD-10-CM | POA: Diagnosis not present

## 2020-02-14 DIAGNOSIS — M316 Other giant cell arteritis: Secondary | ICD-10-CM | POA: Diagnosis not present

## 2020-02-14 DIAGNOSIS — F32A Depression, unspecified: Secondary | ICD-10-CM | POA: Diagnosis not present

## 2020-02-14 DIAGNOSIS — N1831 Chronic kidney disease, stage 3a: Secondary | ICD-10-CM | POA: Diagnosis not present

## 2020-02-14 DIAGNOSIS — I129 Hypertensive chronic kidney disease with stage 1 through stage 4 chronic kidney disease, or unspecified chronic kidney disease: Secondary | ICD-10-CM | POA: Diagnosis not present

## 2020-02-22 ENCOUNTER — Other Ambulatory Visit: Payer: Self-pay | Admitting: Internal Medicine

## 2020-03-09 ENCOUNTER — Encounter: Payer: Self-pay | Admitting: Internal Medicine

## 2020-03-11 DIAGNOSIS — K117 Disturbances of salivary secretion: Secondary | ICD-10-CM | POA: Diagnosis not present

## 2020-03-11 DIAGNOSIS — Z85831 Personal history of malignant neoplasm of soft tissue: Secondary | ICD-10-CM | POA: Diagnosis not present

## 2020-03-11 DIAGNOSIS — C4921 Malignant neoplasm of connective and soft tissue of right lower limb, including hip: Secondary | ICD-10-CM | POA: Diagnosis not present

## 2020-03-11 DIAGNOSIS — M79651 Pain in right thigh: Secondary | ICD-10-CM | POA: Diagnosis not present

## 2020-03-11 DIAGNOSIS — Z923 Personal history of irradiation: Secondary | ICD-10-CM | POA: Diagnosis not present

## 2020-03-11 DIAGNOSIS — E039 Hypothyroidism, unspecified: Secondary | ICD-10-CM | POA: Diagnosis not present

## 2020-03-11 DIAGNOSIS — C099 Malignant neoplasm of tonsil, unspecified: Secondary | ICD-10-CM | POA: Diagnosis not present

## 2020-03-11 DIAGNOSIS — Z08 Encounter for follow-up examination after completed treatment for malignant neoplasm: Secondary | ICD-10-CM | POA: Diagnosis not present

## 2020-03-11 DIAGNOSIS — C770 Secondary and unspecified malignant neoplasm of lymph nodes of head, face and neck: Secondary | ICD-10-CM | POA: Diagnosis not present

## 2020-03-11 DIAGNOSIS — M47814 Spondylosis without myelopathy or radiculopathy, thoracic region: Secondary | ICD-10-CM | POA: Diagnosis not present

## 2020-03-11 DIAGNOSIS — Z85818 Personal history of malignant neoplasm of other sites of lip, oral cavity, and pharynx: Secondary | ICD-10-CM | POA: Diagnosis not present

## 2020-03-11 DIAGNOSIS — R2241 Localized swelling, mass and lump, right lower limb: Secondary | ICD-10-CM | POA: Diagnosis not present

## 2020-03-25 DIAGNOSIS — L814 Other melanin hyperpigmentation: Secondary | ICD-10-CM | POA: Diagnosis not present

## 2020-03-25 DIAGNOSIS — D225 Melanocytic nevi of trunk: Secondary | ICD-10-CM | POA: Diagnosis not present

## 2020-03-25 DIAGNOSIS — L820 Inflamed seborrheic keratosis: Secondary | ICD-10-CM | POA: Diagnosis not present

## 2020-03-25 DIAGNOSIS — Z85828 Personal history of other malignant neoplasm of skin: Secondary | ICD-10-CM | POA: Diagnosis not present

## 2020-03-25 DIAGNOSIS — D485 Neoplasm of uncertain behavior of skin: Secondary | ICD-10-CM | POA: Diagnosis not present

## 2020-03-25 DIAGNOSIS — L578 Other skin changes due to chronic exposure to nonionizing radiation: Secondary | ICD-10-CM | POA: Diagnosis not present

## 2020-03-25 DIAGNOSIS — L821 Other seborrheic keratosis: Secondary | ICD-10-CM | POA: Diagnosis not present

## 2020-03-25 DIAGNOSIS — L570 Actinic keratosis: Secondary | ICD-10-CM | POA: Diagnosis not present

## 2020-03-25 DIAGNOSIS — C44519 Basal cell carcinoma of skin of other part of trunk: Secondary | ICD-10-CM | POA: Diagnosis not present

## 2020-03-25 DIAGNOSIS — I890 Lymphedema, not elsewhere classified: Secondary | ICD-10-CM | POA: Diagnosis not present

## 2020-04-13 DIAGNOSIS — N189 Chronic kidney disease, unspecified: Secondary | ICD-10-CM | POA: Diagnosis not present

## 2020-04-13 DIAGNOSIS — I776 Arteritis, unspecified: Secondary | ICD-10-CM | POA: Diagnosis not present

## 2020-04-13 DIAGNOSIS — M8580 Other specified disorders of bone density and structure, unspecified site: Secondary | ICD-10-CM | POA: Diagnosis not present

## 2020-04-13 DIAGNOSIS — R5383 Other fatigue: Secondary | ICD-10-CM | POA: Diagnosis not present

## 2020-04-13 DIAGNOSIS — E039 Hypothyroidism, unspecified: Secondary | ICD-10-CM | POA: Diagnosis not present

## 2020-04-13 DIAGNOSIS — M62552 Muscle wasting and atrophy, not elsewhere classified, left thigh: Secondary | ICD-10-CM | POA: Diagnosis not present

## 2020-04-13 DIAGNOSIS — M79606 Pain in leg, unspecified: Secondary | ICD-10-CM | POA: Diagnosis not present

## 2020-04-14 DIAGNOSIS — C4921 Malignant neoplasm of connective and soft tissue of right lower limb, including hip: Secondary | ICD-10-CM | POA: Diagnosis not present

## 2020-04-14 DIAGNOSIS — R2241 Localized swelling, mass and lump, right lower limb: Secondary | ICD-10-CM | POA: Diagnosis not present

## 2020-04-14 DIAGNOSIS — Z923 Personal history of irradiation: Secondary | ICD-10-CM | POA: Diagnosis not present

## 2020-04-14 DIAGNOSIS — E039 Hypothyroidism, unspecified: Secondary | ICD-10-CM | POA: Diagnosis not present

## 2020-04-14 DIAGNOSIS — Z08 Encounter for follow-up examination after completed treatment for malignant neoplasm: Secondary | ICD-10-CM | POA: Diagnosis not present

## 2020-04-14 DIAGNOSIS — Z85831 Personal history of malignant neoplasm of soft tissue: Secondary | ICD-10-CM | POA: Diagnosis not present

## 2020-04-14 DIAGNOSIS — K117 Disturbances of salivary secretion: Secondary | ICD-10-CM | POA: Diagnosis not present

## 2020-04-14 DIAGNOSIS — R432 Parageusia: Secondary | ICD-10-CM | POA: Diagnosis not present

## 2020-04-14 DIAGNOSIS — Z85818 Personal history of malignant neoplasm of other sites of lip, oral cavity, and pharynx: Secondary | ICD-10-CM | POA: Diagnosis not present

## 2020-04-14 DIAGNOSIS — R252 Cramp and spasm: Secondary | ICD-10-CM | POA: Diagnosis not present

## 2020-04-14 DIAGNOSIS — Z85118 Personal history of other malignant neoplasm of bronchus and lung: Secondary | ICD-10-CM | POA: Diagnosis not present

## 2020-04-14 DIAGNOSIS — C770 Secondary and unspecified malignant neoplasm of lymph nodes of head, face and neck: Secondary | ICD-10-CM | POA: Diagnosis not present

## 2020-04-14 DIAGNOSIS — C099 Malignant neoplasm of tonsil, unspecified: Secondary | ICD-10-CM | POA: Diagnosis not present

## 2020-04-14 DIAGNOSIS — R1310 Dysphagia, unspecified: Secondary | ICD-10-CM | POA: Diagnosis not present

## 2020-04-15 ENCOUNTER — Encounter: Payer: Self-pay | Admitting: Internal Medicine

## 2020-04-22 ENCOUNTER — Encounter: Payer: Self-pay | Admitting: Internal Medicine

## 2020-04-22 ENCOUNTER — Other Ambulatory Visit: Payer: Self-pay

## 2020-04-22 ENCOUNTER — Non-Acute Institutional Stay: Payer: Medicare Other | Admitting: Internal Medicine

## 2020-04-22 VITALS — BP 134/78 | HR 67 | Temp 97.4°F | Wt 154.0 lb

## 2020-04-22 DIAGNOSIS — G4733 Obstructive sleep apnea (adult) (pediatric): Secondary | ICD-10-CM | POA: Diagnosis not present

## 2020-04-22 DIAGNOSIS — E785 Hyperlipidemia, unspecified: Secondary | ICD-10-CM | POA: Diagnosis not present

## 2020-04-22 DIAGNOSIS — Z85831 Personal history of malignant neoplasm of soft tissue: Secondary | ICD-10-CM | POA: Diagnosis not present

## 2020-04-22 DIAGNOSIS — I890 Lymphedema, not elsewhere classified: Secondary | ICD-10-CM

## 2020-04-22 DIAGNOSIS — I712 Thoracic aortic aneurysm, without rupture, unspecified: Secondary | ICD-10-CM

## 2020-04-22 DIAGNOSIS — E538 Deficiency of other specified B group vitamins: Secondary | ICD-10-CM

## 2020-04-22 DIAGNOSIS — F3341 Major depressive disorder, recurrent, in partial remission: Secondary | ICD-10-CM

## 2020-04-22 DIAGNOSIS — M316 Other giant cell arteritis: Secondary | ICD-10-CM

## 2020-04-22 DIAGNOSIS — I10 Essential (primary) hypertension: Secondary | ICD-10-CM | POA: Diagnosis not present

## 2020-04-23 ENCOUNTER — Other Ambulatory Visit: Payer: Self-pay | Admitting: Internal Medicine

## 2020-04-23 ENCOUNTER — Encounter: Payer: Self-pay | Admitting: Internal Medicine

## 2020-04-23 NOTE — Progress Notes (Addendum)
Location:  [place] of Service:  Clinic (12)  Provider:   Code Status:  Goals of Care:  Advanced Directives 07/29/2020  Does Patient Have a Medical Advance Directive? Yes  Type of Advance Directive Out of facility DNR (pink MOST or yellow form);[place];Living will  Does patient want to make changes to medical advance directive? No - Patient declined  Copy of [place] in Chart? Yes - validated most recent copy scanned in chart (See row information)  Would patient like information on creating a medical advance directive? -  Pre-existing out of facility DNR order (yellow form or pink MOST form) -     Chief Complaint  Patient presents with   Medical Management of Chronic Issues    Patient returns to the clinic for 4 month follow up.     HPI: Patient is a 87 y.o. male seen today for medical management of chronic diseases.    Patient came for a follow-up.  He lives with his wife in independent [place] Very active still drives and exercise  Has Past medical  history of sarcoma of Right lower extremity Follows closely with oncology at [facility] for CT scan of his chest for lung nodules. Has been in remission History of prostate cancer. History of tonsillar cancer Macular degeneration follows with ophthalmologist Aortitis syndrome.  Follows with Dr. Dossie Der who sometimes has to use prednisone as needed B12 deficiency takes injection self administer Hypothyroid follows with Dr. Chalmers Cater History of OSA does not use CPAP as he thinks it does not help. Patient is active issues are Left lower extremity edema ? lymphedema Does not like TED hoses has done therapy massage. Has very dry skin. Depression States that he has increased his Wellbutrin 200 mg 3 times a day and wants to continue that.  Past Medical History:  Diagnosis Date   Age-related macular degeneration, dry, both eyes    Aortitis syndrome ([place]) rheumotologist-   dr syed   IgG4 syndrome--  (effects abdomine) ,  previously long term use prednisone last taken 07/ 2021   Basal cell carcinoma (BCC) of eye    CKD (chronic kidney disease), stage III [facility])    nephrologist-- dr Johnette Abraham. [name]   Complication of anesthesia    post op acute urinary retention   DDD (degenerative disc disease)    Fatigue    pt evalulation by cardiology--- dr Fransico Michael, note 05-21-2019 in epic,(non cardiac)  normal echo and event monitor showed extra beats of atrial tachycardia , no atrial fib, and rare ecotpy   Full dentures    GERD (gastroesophageal reflux disease)    History of basal cell carcinoma (BCC) excision    12-25-2018 MOH's reconstruction left lower eyelid   History of external beam radiation therapy 2002   prostate cancer  and boost with radioative prostate seed implants   History of prostate cancer DX  2002   S/P EXTERNAL RADIATION/ RADIOACTIVE SEED IMPLANTS  2003   History of squamous cell carcinoma excision    2012--  RIGHT LOWER EXTREM;  2019 LEFT LOWER EXTREMITIY   HTN (hypertension)    followed by pcp   Hyperlipidemia    Hypothyroidism    followed by pcp--- acquired atrophy due to radiation;  previously seen by endocrinologist-  dr Chalmers Cater--- per pt takes his own thyroid med. called NP Thyroid 90 mg daily, does not take synthroid   Large vessel vasculitis [facility])    rheumotologist-  dr syed ---  IgG4 related aoritis involving renal arteries ,  pt treated with predisone.  (09-06-2019  per pt no longer prednisone last taken one month ago 07/ 2021, weaned off by pcp per pt request)   Left renal artery stenosis [facility])    nephrologist--- dr [name]--- with aneurysm proximal    Lymphedema of left leg    followed by [facility]---- pt stated uses compression hose   OSA (obstructive sleep apnea) 09-06-2019  per pt lasted used cpap approx. 2019, stated felt he no longer needed    pt retested 11-13-2016 at [facility] w/ dr ather-- moderate to severe OSA , AHI  16.4/h/  03-24-2017    Pulmonary nodule followed by [facility]   per CT 06-11-2019  in [place] in epic done at [place],  LUL nodule , not mets   Renal cyst, right    Restless legs syndrome (RLS)    S/P radiation therapy     for tonsillar cancer (head and neck) completed 01-26-2012 at [facility]);   and XRT for right lower leg sarcoma --- completed 06-06-2017   Saliva decreased    Sarcoma of lower extremity, right ([place]) oncolgoist-  dr Juluis Rainier ([facility]) & Dr [name] ([facility])   dx 02-14-2017 w/ needle core bx;  high grade pleomorphic spindle cell sarcoma , grade 2 (cT2N0M0); 03-15-2017  radical resection sarcoma tumor right lower leg  and completed radiation 06-06-2017   Self-catheterizes urinary bladder    QID   AND   PRN   Tonsillar cancer ([place]) 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+) (Stage IVA)---- dx oct 2013  ----s/p left tonsillectomy and concurent chemo and radiation/  ended 01-26-2012-- no surgical intervention---  residuals ( dry mouth, decreased saliva)   oncologist at [place]--  dr brizel--  HX OF -- NO RECURRENCE   Urethral false passage 07/2019   Urethral stricture urologist--- dr Jeffie Pollock   chronic---  post urethral dilation's   Urinary retention with incomplete bladder emptying    Vitamin B 12 deficiency 01/28/2011   Vitamin D deficiency     Past Surgical History:  Procedure Laterality Date   BALLOON DILATION N/A 11/10/2014   Procedure: CYSTO BALLOON DILATION AND RETROGRADE URETHROGRAM ;  Surgeon: Bjorn Loser, MD;  Location: [facility];  Service: Urology;  Laterality: N/A;   CATARACT EXTRACTION W/ INTRAOCULAR LENS  IMPLANT, BILATERAL  2012  approx   CYSTO/ BALLOON DILATION OF  URETHRAL STRICTURE  12-25-2010   CYSTOSCOPY WITH RETROGRADE URETHROGRAM N/A 03/30/2016   Procedure: CYSTOSCOPY WITH RETROGRADE URETHROGRAM AND BALLOON DILATION with cystogram;  Surgeon: Bjorn Loser, MD;  Location: [facility];  Service: Urology;  Laterality: N/A;   CYSTOSCOPY WITH URETHRAL DILATATION  05/31/2011   Procedure: CYSTOSCOPY WITH URETHRAL DILATATION;  Surgeon: Reece Packer, MD;  Location: [place];  Service: Urology;  Laterality: N/A;  BALLOON DILATION   CYSTOSCOPY WITH URETHRAL DILATATION N/A 09/13/2016   Procedure: CYSTOSCOPY WITH URETHRAL BALLOON DILATATION;  Surgeon: Bjorn Loser, MD;  Location: [place];  Service: Urology;  Laterality: N/A;   CYSTOSCOPY WITH URETHRAL DILATATION N/A 03/30/2017   Procedure: CYSTOSCOPY WITH BALLOON URETHRAL DILATATION;  Surgeon: Bjorn Loser, MD;  Location: [place];  Service: Urology;  Laterality: N/A;   CYSTOSCOPY WITH URETHRAL DILATATION N/A 09/12/2019   Procedure: CYSTOSCOPY WITH URETHRAL DILATATION;  Surgeon: Irine Seal, MD;  Location: [place];  Service: Urology;  Laterality:  N/A;   CYSTOSCOPY/RETROGRADE/URETEROSCOPY N/A 05/22/2012   Procedure: CYSTOSCOPY BALLOON DILATION RETROGRADE URETEROGRAM ;  Surgeon: Reece Packer, MD;  Location: [place];  Service: Urology;  Laterality: N/A;   CYTSO/  DILATATION URETHRAL STRICTURE/  BX PROSTATIC URETHRA/  REMOVAL FOREIGN BODIES  06-25-2010   [facility]   ECTROPION REPAIR Left 06/18/2019   lower eyelid   INGUINAL HERNIA REPAIR Right 2000   MOHS SURGERY  01/ 2019    [facility]   left lower leg for [facility]   MOHS SURGERY  12/25/2018   for [facility] of left lower eyelid;  the second stage Hughs flap release 01-09-2019   RADICAL RESECTION OF SARCOMA TUMOR   03-15-2017   [facility]   RIGHT LOWER LEG , CALF AREA   RADIOACTIVE SEED IMPLANTS, PROSTATE  JAN  2003   SKIN LESION EXCISION  07/2012   MOST  right shoulder   TONSILLECTOMY Left 11-10-2011  @[facility]    tonsillar cancer   TRANSURETHRAL INCISION OF BLADDER NECK N/A 09/12/2019   Procedure: TRANSURETHRAL INCISION OF BLADDER NECK;  Surgeon: Irine Seal, MD;  Location: [place];  Service: Urology;  Laterality: N/A;    Allergies  Allergen Reactions   Cefixime Rash   Contrast Media [Iodinated Diagnostic Agents] Other (See Comments)    Avoid due to renal disease   Macrobid [Nitrofurantoin Macrocrystal] Swelling   Nsaids Other (See Comments)    Has been told no NSAIDs, Ibuprofen Renal insufficiency hx.    Cephalosporins Rash    Outpatient Encounter Medications as of 04/22/2020  Medication Sig   buPROPion (WELLBUTRIN) 100 MG tablet Take 1 tablet by mouth twice daily (Patient taking differently: 100 mg 3 (three) times daily.)   Cranberry-Vitamin C (AZO CRANBERRY URINARY TRACT PO) Take by mouth.   cyanocobalamin (,VITAMIN B-12,) 1000 MCG/ML injection Inject 1 mL (1,000 mcg total) into the muscle every 14 (fourteen) days.   Cyanocobalamin-Methylcobalamin 600-600 MCG SUBL Place under the tongue daily. (Patient not taking: Reported on 5/00/9381)   Garlic (GARLIQUE PO) Take by mouth every other day.   Multiple Vitamins-Minerals (ICAPS AREDS 2 PO) Take 2 tablets by mouth daily. (Patient not taking: No sig reported)   RESVERATROL PO Take 100 mg by mouth daily.    thyroid (ARMOUR) 90 MG tablet Take 90 mg by mouth daily.   TURMERIC CURCUMIN PO Take 2 tablets by mouth daily.    UNABLE TO FIND [place] take once daily   UNABLE TO FIND [place] take 4 tablets once daily   UNABLE TO FIND Comprehensive immune support take 2 tablets once daily   UNABLE TO FIND Modified citrus pectin take 4 tablets once daily   UNABLE TO FIND Celery seed take 2 tablets by mouth once daily   UNABLE TO FIND Maxivision multiple vitamin for eyes   [DISCONTINUED] lisinopril (ZESTRIL) 10 MG tablet Take 1 tablet by mouth once daily   No facility-administered encounter medications on file as of 04/22/2020.    Review of Systems:  Review of Systems  Constitutional:  Positive for activity change.  HENT: Negative.    Respiratory: Negative.    Cardiovascular:  Positive for leg swelling.   Gastrointestinal: Negative.   Genitourinary: Negative.   Musculoskeletal:  Positive for arthralgias and myalgias.  Skin:  Positive for rash.  Neurological:  Positive for dizziness.  Psychiatric/Behavioral:  Positive for dysphoric mood. The patient is nervous/anxious.    Health Maintenance  Topic Date Due   Zoster Vaccines- Shingrix (1 of 2) Never  done   COVID-19 Vaccine (4 - Booster for Moderna series) 03/04/2020   TETANUS/TDAP  08/06/2020 (Originally 01/17/2013)   INFLUENZA VACCINE  08/17/2020   PNA vac Low Risk Adult  Completed   HPV VACCINES  Aged Out    Physical Exam: Vitals:   04/23/20 1139  BP: 134/78  Pulse: 67  Temp: (!) 97.4 F (36.3 C)  SpO2: 97%  Weight: 154 lb (69.9 kg)   Body mass index is 21.48 kg/m. Physical Exam Constitutional: Oriented to person, place, and time. Well-developed and well-nourished.  HENT:  Head: Normocephalic.  Mouth/Throat: Oropharynx is clear and moist.  Eyes: Pupils are equal, round, and reactive to light.  Neck: Neck supple.  Cardiovascular: Normal rate and normal heart sounds.  No murmur heard. Pulmonary/Chest: Effort normal and breath sounds normal. No respiratory distress. No wheezes. She has no rales.  Abdominal: Soft. Bowel sounds are normal. No distension. There is no tenderness. There is no rebound.  Musculoskeletal: . Edema oF left Leg Lymphadenopathy: none Neurological: Alert and oriented to person, place, and time.  Skin: Skin is warm and dry.  Psychiatric: Normal mood and affect. Behavior is normal. Thought content normal.   Labs reviewed: Basic Metabolic Panel: Recent Labs    09/12/19 1100 07/28/20 0000  NA 142 140  K 4.2 4.6  CL 103 102  CO2  --  28*  GLUCOSE 102*  --   BUN 21 28*  CREATININE 1.40* 1.5*  CALCIUM  --  9.4   Liver Function Tests: Recent Labs    07/28/20 0000  AST 12*  ALT 11  ALKPHOS 57  ALBUMIN 4.2   No results for input(s): LIPASE, AMYLASE in the last 8760 hours. No results for  input(s): AMMONIA in the last 8760 hours. CBC: Recent Labs    09/12/19 1100 07/28/20 0000  WBC  --  5.0  HGB 14.3 12.8*  HCT 42.0 37*  PLT  --  203   Lipid Panel: Recent Labs    07/28/20 0000  CHOL 218*  HDL 65  LDLCALC 139  TRIG 73   No results found for: HGBA1C  Procedures since last visit: No results found.  Assessment/Plan Edema of left leg Ted hoses Prn Has had Work up in [place] specialist  Hypertension On Low Dose of Lisinopril Large vessel vasculitis ([place]) Follows with Dr Dossie Der and Needs Prednisone PRn OSA (obstructive sleep apnea) Does not use CPAP as it seems to not help History of sarcoma In Remission Follows with [facility]  Thoracic aortic aneurysm without rupture (HCC) Follows at [facility] Recurrent major depressive disorder, in partial remission ([place]) Has increased his Wellbutrin to TID Will update that in his Med list B12 deficiency Check the levels Hyperlipidemia, unspecified hyperlipidemia type LDL elevated before  Will Reorder Lipid panel Hypothyroid Follows with Dr Chalmers Cater     Labs/tests ordered: Vit B12 level, Lipid Panel,CBC,CMP Next appt:  04/23/2020

## 2020-05-07 ENCOUNTER — Encounter (INDEPENDENT_AMBULATORY_CARE_PROVIDER_SITE_OTHER): Payer: Medicare Other | Admitting: Ophthalmology

## 2020-05-07 ENCOUNTER — Encounter (INDEPENDENT_AMBULATORY_CARE_PROVIDER_SITE_OTHER): Payer: No Typology Code available for payment source | Admitting: Ophthalmology

## 2020-05-13 DIAGNOSIS — N35012 Post-traumatic membranous urethral stricture: Secondary | ICD-10-CM | POA: Diagnosis not present

## 2020-05-13 DIAGNOSIS — Z8546 Personal history of malignant neoplasm of prostate: Secondary | ICD-10-CM | POA: Diagnosis not present

## 2020-05-13 DIAGNOSIS — N3020 Other chronic cystitis without hematuria: Secondary | ICD-10-CM | POA: Diagnosis not present

## 2020-05-13 DIAGNOSIS — R3914 Feeling of incomplete bladder emptying: Secondary | ICD-10-CM | POA: Diagnosis not present

## 2020-05-19 ENCOUNTER — Encounter (INDEPENDENT_AMBULATORY_CARE_PROVIDER_SITE_OTHER): Payer: Medicare Other | Admitting: Ophthalmology

## 2020-06-16 ENCOUNTER — Encounter (INDEPENDENT_AMBULATORY_CARE_PROVIDER_SITE_OTHER): Payer: Medicare Other | Admitting: Ophthalmology

## 2020-06-23 DIAGNOSIS — M1711 Unilateral primary osteoarthritis, right knee: Secondary | ICD-10-CM | POA: Diagnosis not present

## 2020-06-25 ENCOUNTER — Ambulatory Visit (INDEPENDENT_AMBULATORY_CARE_PROVIDER_SITE_OTHER): Payer: Medicare Other | Admitting: Ophthalmology

## 2020-06-25 ENCOUNTER — Encounter (INDEPENDENT_AMBULATORY_CARE_PROVIDER_SITE_OTHER): Payer: Self-pay | Admitting: Ophthalmology

## 2020-06-25 ENCOUNTER — Other Ambulatory Visit: Payer: Self-pay

## 2020-06-25 DIAGNOSIS — H353132 Nonexudative age-related macular degeneration, bilateral, intermediate dry stage: Secondary | ICD-10-CM

## 2020-06-25 DIAGNOSIS — H43813 Vitreous degeneration, bilateral: Secondary | ICD-10-CM | POA: Diagnosis not present

## 2020-06-25 NOTE — Assessment & Plan Note (Signed)

## 2020-06-25 NOTE — Assessment & Plan Note (Signed)
Dry ARMD OU, with no signs of incipient CNVM in either eye by clinical examination nor on OCT.  Significant outer retinal atrophy however is noted and displayed particularly in the right eye with loss of the photoreceptor layers And outer retinal layers into the foveal region.  OS with extensive dry ARMD as well but slightly less extensive .  More subfoveal large drusenoid pigment epithelial detachments noted but no signs of CNVM OS

## 2020-06-25 NOTE — Progress Notes (Signed)
06/25/2020     CHIEF COMPLAINT Patient presents for Retina Follow Up (6 month fu OU and OCT/FP/Pt states, "I feel that I am having a slight general deterioration and blurriness.")   HISTORY OF PRESENT ILLNESS: Philip [name] is a 87 y.o. male who presents to the clinic today for:   HPI     Retina Follow Up           Diagnosis: Other   Laterality: both eyes   Onset: 6 months ago   Severity: mild   Duration: 6 weeks   Course: gradually worsening   Comments: 6 month fu OU and OCT/FP Pt states, "I feel that I am having a slight general deterioration and blurriness."       Last edited by Kendra Opitz, COA on 06/25/2020  3:35 PM.      Referring physician: Gayland Curry, DO [place],  [place] [id number]  HISTORICAL INFORMATION:   Selected notes from the [place]: No current outpatient medications on file. (Ophthalmic Drugs)   No current facility-administered medications for this visit. (Ophthalmic Drugs)   Current Outpatient Medications (Other)  Medication Sig   buPROPion (WELLBUTRIN) 100 MG tablet Take 1 tablet by mouth twice daily (Patient taking differently: 100 mg 3 (three) times daily.)   Cranberry-Vitamin C (AZO CRANBERRY URINARY TRACT PO) Take by mouth.   cyanocobalamin (,VITAMIN B-12,) 1000 MCG/ML injection Inject 1 mL (1,000 mcg total) into the muscle every 14 (fourteen) days.   Cyanocobalamin-Methylcobalamin 600-600 MCG SUBL Place under the tongue daily.   Garlic (GARLIQUE PO) Take by mouth every other day.   lisinopril (ZESTRIL) 10 MG tablet Take 1 tablet by mouth once daily   Multiple Vitamins-Minerals (ICAPS AREDS 2 PO) Take 2 tablets by mouth daily.   RESVERATROL PO Take 100 mg by mouth daily.    thyroid (ARMOUR) 90 MG tablet Take 90 mg by mouth daily.   TURMERIC CURCUMIN PO Take 2 tablets by mouth daily.    UNABLE TO FIND [place] Bone take once daily   UNABLE TO FIND [place] take 4 tablets once  daily   UNABLE TO FIND Comprehensive immune support take 2 tablets once daily   UNABLE TO FIND Modified citrus pectin take 4 tablets once daily   UNABLE TO FIND Celery seed take 2 tablets by mouth once daily   UNABLE TO FIND Maxivision multiple vitamin for eyes   No current facility-administered medications for this visit. (Other)      REVIEW OF SYSTEMS:    ALLERGIES Allergies  Allergen Reactions   Cefixime Rash   Contrast Media [Iodinated Diagnostic Agents] Other (See Comments)    Avoid due to renal disease   Macrobid [Nitrofurantoin Macrocrystal] Swelling   Nsaids Other (See Comments)    Has been told no NSAIDs, Ibuprofen Renal insufficiency hx.    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 [name]   Complication of anesthesia    post op acute urinary retention   DDD (degenerative disc disease)    Fatigue  pt evalulation by cardiology--- dr Fransico Michael, note 05-21-2019 in epic,(non cardiac)  normal echo and event monitor showed extra beats of atrial tachycardia , no atrial fib, and rare ecotpy   Full dentures    GERD (gastroesophageal reflux disease)    History of basal cell carcinoma (BCC) excision    12-25-2018 MOH's reconstruction left lower eyelid   History of external beam radiation therapy 2002   prostate cancer  and boost with radioative prostate seed implants   History of prostate cancer DX  2002   S/P EXTERNAL RADIATION/ RADIOACTIVE SEED IMPLANTS  2003   History of squamous cell carcinoma excision    2012--  RIGHT LOWER EXTREM;  2019 LEFT LOWER EXTREMITIY   HTN (hypertension)    followed by pcp    Hyperlipidemia    Hypothyroidism    followed by pcp--- acquired atrophy due to radiation;  previously seen by endocrinologist-  dr Chalmers Cater--- per pt takes his own thyroid med. called NP Thyroid 90 mg daily, does not take synthroid   Large vessel vasculitis ([place])    rheumotologist-  dr syed --- IgG4 related aoritis involving renal arteries ,  pt treated with predisone.  (09-06-2019  per pt no longer prednisone last taken one month ago 07/ 2021, weaned off by pcp per pt request)   Left renal artery stenosis [facility])    nephrologist--- dr [name]--- with aneurysm proximal    Lymphedema of left leg    followed by [facility]---- pt stated uses compression hose   OSA (obstructive sleep apnea) 09-06-2019  per pt lasted used cpap approx. 2019, stated felt he no longer needed    pt retested 11-13-2016 at [facility] w/ dr ather-- moderate to severe OSA , AHI 16.4/h/  03-24-2017    Pulmonary nodule followed by [facility]   per CT 06-11-2019  in [place] in epic done at [facility],  LUL nodule , not mets   Renal cyst, right    Restless legs syndrome (RLS)    S/P radiation therapy     for tonsillar cancer (head and neck) completed 01-26-2012 at [facility]);   and XRT for right lower leg sarcoma --- completed 06-06-2017   Saliva decreased    Sarcoma of lower extremity, right ([place]) oncolgoist-  dr Juluis Rainier ([facility]) & Dr [name] ([facility])   dx 02-14-2017 w/ needle core bx;  high grade pleomorphic spindle cell sarcoma , grade 2 (cT2N0M0); 03-15-2017  radical resection sarcoma tumor right lower leg  and completed radiation 06-06-2017   Self-catheterizes urinary bladder    QID   AND   PRN   Tonsillar cancer ([place]) 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+) (Stage IVA)---- dx oct 2013  ----s/p left tonsillectomy and concurent chemo and radiation/  ended 01-26-2012-- no surgical intervention---  residuals ( dry mouth, decreased saliva)   oncologist at [place]--   dr brizel--  HX OF -- NO RECURRENCE   Urethral false passage 07/2019   Urethral stricture urologist--- dr Jeffie Pollock   chronic---  post urethral dilation's   Urinary retention with incomplete bladder emptying    Vitamin B 12 deficiency 01/28/2011   Vitamin D deficiency    Past Surgical History:  Procedure Laterality Date   BALLOON DILATION N/A 11/10/2014   Procedure: CYSTO BALLOON DILATION AND RETROGRADE URETHROGRAM ;  Surgeon: Bjorn Loser, MD;  Location[facility];  Service: Urology;  Laterality: N/A;   CATARACT EXTRACTION W/ INTRAOCULAR LENS  IMPLANT, BILATERAL  2012  approx   CYSTO/ BALLOON DILATION OF  URETHRAL STRICTURE  12-25-2010   CYSTOSCOPY WITH RETROGRADE URETHROGRAM N/A 03/30/2016   Procedure: CYSTOSCOPY WITH RETROGRADE URETHROGRAM AND BALLOON DILATION with cystogram;  Surgeon: Bjorn Loser, MD;  Location: [facility];  Service: Urology;  Laterality: N/A;   CYSTOSCOPY WITH URETHRAL DILATATION  05/31/2011   Procedure: CYSTOSCOPY WITH URETHRAL DILATATION;  Surgeon: Reece Packer, MD;  Location: [place];  Service: Urology;  Laterality: N/A;  BALLOON DILATION   CYSTOSCOPY WITH URETHRAL DILATATION N/A 09/13/2016   Procedure: CYSTOSCOPY WITH URETHRAL BALLOON DILATATION;  Surgeon: Bjorn Loser, MD;  Location: [place];  Service: Urology;  Laterality: N/A;   CYSTOSCOPY WITH URETHRAL DILATATION N/A 03/30/2017   Procedure: CYSTOSCOPY WITH BALLOON URETHRAL DILATATION;  Surgeon: Bjorn Loser, MD;  Location: [place];  Service: Urology;  Laterality: N/A;   CYSTOSCOPY WITH URETHRAL DILATATION N/A 09/12/2019   Procedure: CYSTOSCOPY WITH URETHRAL DILATATION;  Surgeon: Irine Seal, MD;  Location: [facility];  Service: Urology;  Laterality: N/A;   CYSTOSCOPY/RETROGRADE/URETEROSCOPY N/A 05/22/2012   Procedure: CYSTOSCOPY BALLOON DILATION RETROGRADE URETEROGRAM ;  Surgeon: Reece Packer, MD;  Location: [place];  Service: Urology;  Laterality: N/A;   CYTSO/  DILATATION URETHRAL STRICTURE/  BX PROSTATIC URETHRA/  REMOVAL FOREIGN BODIES  06-25-2010   [facility]   ECTROPION REPAIR Left 06/18/2019   lower eyelid   INGUINAL HERNIA REPAIR Right 2000   MOHS SURGERY  01/ 2019    [facility]   left lower leg for [facility]   MOHS SURGERY  12/25/2018   for [facility] of left lower eyelid;  the second stage Hughs flap release 01-09-2019   RADICAL RESECTION OF SARCOMA TUMOR   03-15-2017   [facility]   RIGHT LOWER LEG , CALF AREA   RADIOACTIVE SEED IMPLANTS, PROSTATE  JAN  2003   SKIN LESION EXCISION  07/2012   MOST  right shoulder   TONSILLECTOMY Left 11-10-2011  @[facility]    tonsillar cancer   TRANSURETHRAL INCISION OF BLADDER NECK N/A 09/12/2019   Procedure: TRANSURETHRAL INCISION OF BLADDER NECK;  Surgeon: Irine Seal, MD;  Location: [place];  Service: Urology;  Laterality: N/A;    FAMILY HISTORY Family History  Problem Relation Age of Onset   Stroke Father     SOCIAL HISTORY Social History   Tobacco Use   Smoking status: Never   Smokeless tobacco: Never  Vaping Use   Vaping Use: Never used  Substance Use Topics   Alcohol use: Yes    Alcohol/week: 7.0 standard drinks    Types: 7 Glasses of wine per week    Comment: 8oz, glass or red wine   Drug use: No         OPHTHALMIC EXAM:  Base Eye Exam     Visual Acuity (ETDRS)       Right Left   Dist cc 20/100 20/70 +2   Dist ph cc 20/80 -2 20/30 +2    Correction: Glasses         Tonometry (Tonopen, 3:41 PM)       Right Left   Pressure 13 13         Pupils       Pupils Dark Light Shape React APD   Right PERRL 4 3 Round Brisk None   Left PERRL 4 3 Round Brisk None         Visual Fields (Counting  fingers)       Left Right    Full Full         Extraocular Movement       Right Left    Full Full         Neuro/Psych     Oriented x3: Yes   Mood/Affect: Normal          Dilation     Both eyes: 1.0% Mydriacyl, 2.5% Phenylephrine @ 3:36 PM           Slit Lamp and Fundus Exam     External Exam       Right Left   External Normal Normal         Slit Lamp Exam       Right Left   Lids/Lashes Normal Normal   Conjunctiva/Sclera White and quiet White and quiet   Cornea Clear Clear   Anterior Chamber Deep and quiet Deep and quiet   Iris Round and reactive Round and reactive   Lens Posterior chamber intraocular lens Posterior chamber intraocular lens   Anterior Vitreous Normal Normal         Fundus Exam       Right Left   Posterior Vitreous Posterior vitreous detachment Posterior vitreous detachment   Disc Normal Normal   C/D Ratio 0.3 0.45   Macula Intermediate age related macular degeneration, Retinal pigment epithelial atrophy, Soft drusen, no exudates, no hemorrhage, Retinal pigment epithelial mottling Intermediate age related macular degeneration, Retinal pigment epithelial atrophy, Soft drusen, no exudates, no hemorrhage, Retinal pigment epithelial mottling, no macular thickening, pigmented atrophy   Vessels Normal Normal   Periphery Normal Normal            IMAGING AND PROCEDURES  Imaging and Procedures for 06/25/20  OCT, Retina - OU - Both Eyes       Right Eye Quality was good. Scan locations included subfoveal. Central Foveal Thickness: 254. Progression has been stable. Findings include retinal drusen , abnormal foveal contour.   Left Eye Quality was good. Scan locations included subfoveal. Central Foveal Thickness: 269. Progression has been stable. Findings include retinal drusen , abnormal foveal contour.   Notes No signs of CNVM OU, no change     Color Fundus Photography Optos - OU - Both Eyes       Right Eye Progression has been stable. Disc findings include normal observations. Macula : geographic atrophy, drusen. Vessels : normal observations. Periphery : normal observations.   Left Eye Progression has been  stable. Disc findings include normal observations. Macula : drusen, geographic atrophy. Vessels : normal observations. Periphery : normal observations.              ASSESSMENT/PLAN:  Intermediate stage nonexudative age-related macular degeneration of both eyes Dry ARMD OU, with no signs of incipient CNVM in either eye by clinical examination nor on OCT.  Significant outer retinal atrophy however is noted and displayed particularly in the right eye with loss of the photoreceptor layers And outer retinal layers into the foveal region.  OS with extensive dry ARMD as well but slightly less extensive .  More subfoveal large drusenoid pigment epithelial detachments noted but no signs of CNVM OS  Posterior vitreous detachment of both eyes  The nature of posterior vitreous detachment was discussed with the patient as well as its physiology, its age prevalence, and its possible implication regarding retinal breaks and detachment.  An informational brochure was offered to the patient.  All the patient's questions were answered.  The patient was asked to return if new or different flashes or floaters develops.   Patient was instructed to contact office immediately if any new changes were noticed. I explained to the patient that vitreous inside the eye is similar to jello inside a bowl. As the jello melts it can start to pull away from the bowl, similarly the vitreous throughout our lives can begin to pull away from the retina. That process is called a posterior vitreous detachment. In some cases, the vitreous can tug hard enough on the retina to form a retinal tear. I discussed with the patient the signs and symptoms of a retinal detachment.  Do not rub the eye.       ICD-10-CM   1. Intermediate stage nonexudative age-related macular degeneration of both eyes  H35.3132 OCT, Retina - OU - Both Eyes    Color Fundus Photography Optos - OU - Both Eyes    2. Posterior vitreous detachment of both eyes   H43.813       1.  No interval change, no signs of CNVM formation  2.  Pinhole visual acuity in each eye has not shown signs of improvement with his current glasses, I would suggest follow-up with [facility] and consideration of new refraction  3.  Ophthalmic Meds Ordered this visit:  No orders of the defined types were placed in this encounter.      Return in about 6 months (around 12/25/2020) for DILATE OU, OCT.  There are no Patient Instructions on file for this visit.   Explained the diagnoses, plan, and follow up with the patient and they expressed understanding.  Patient expressed understanding of the importance of proper follow up care.   Clent Demark Mendell Bontempo M.D. Diseases & Surgery of the Retina and Vitreous Retina & Diabetic [place] 06/25/20     Abbreviations: M myopia (nearsighted); A astigmatism; H hyperopia (farsighted); P presbyopia; Mrx spectacle prescription;  CTL contact lenses; OD right eye; OS left eye; OU both eyes  XT exotropia; ET esotropia; PEK punctate epithelial keratitis; PEE punctate epithelial erosions; DES dry eye syndrome; MGD meibomian gland dysfunction; ATs artificial tears; PFAT's preservative free artificial tears; [place] nuclear sclerotic cataract; PSC posterior subcapsular cataract; ERM epi-retinal membrane; PVD posterior vitreous detachment; RD retinal detachment; DM diabetes mellitus; DR diabetic retinopathy; NPDR non-proliferative diabetic retinopathy; PDR proliferative diabetic retinopathy; CSME clinically significant macular edema; DME diabetic macular edema; dbh dot blot hemorrhages; CWS cotton wool spot; POAG primary open angle glaucoma; C/D cup-to-disc ratio; HVF humphrey visual field; GVF goldmann visual field; OCT optical coherence tomography; IOP intraocular pressure; BRVO Branch retinal vein occlusion; CRVO central retinal vein occlusion; CRAO central retinal artery occlusion; BRAO branch retinal artery occlusion; RT  retinal tear; SB scleral buckle; PPV pars plana vitrectomy; VH Vitreous hemorrhage; PRP panretinal laser photocoagulation; IVK intravitreal kenalog; VMT vitreomacular traction; MH Macular hole;  NVD neovascularization of the disc; NVE neovascularization elsewhere; AREDS age related eye disease study; ARMD age related macular degeneration; POAG primary open angle glaucoma; EBMD epithelial/anterior basement membrane dystrophy; ACIOL anterior chamber intraocular lens; IOL intraocular lens; PCIOL posterior chamber intraocular lens; Phaco/IOL phacoemulsification with intraocular lens placement; [place] photorefractive keratectomy; LASIK laser assisted in situ keratomileusis; HTN hypertension; DM diabetes mellitus; COPD chronic obstructive pulmonary disease

## 2020-06-30 ENCOUNTER — Encounter (INDEPENDENT_AMBULATORY_CARE_PROVIDER_SITE_OTHER): Payer: Medicare Other | Admitting: Ophthalmology

## 2020-07-11 DIAGNOSIS — G4733 Obstructive sleep apnea (adult) (pediatric): Secondary | ICD-10-CM | POA: Diagnosis not present

## 2020-07-15 DIAGNOSIS — I776 Arteritis, unspecified: Secondary | ICD-10-CM | POA: Diagnosis not present

## 2020-07-15 DIAGNOSIS — R5383 Other fatigue: Secondary | ICD-10-CM | POA: Diagnosis not present

## 2020-07-15 DIAGNOSIS — M8580 Other specified disorders of bone density and structure, unspecified site: Secondary | ICD-10-CM | POA: Diagnosis not present

## 2020-07-15 DIAGNOSIS — E039 Hypothyroidism, unspecified: Secondary | ICD-10-CM | POA: Diagnosis not present

## 2020-07-15 DIAGNOSIS — N189 Chronic kidney disease, unspecified: Secondary | ICD-10-CM | POA: Diagnosis not present

## 2020-07-15 DIAGNOSIS — M79606 Pain in leg, unspecified: Secondary | ICD-10-CM | POA: Diagnosis not present

## 2020-07-22 ENCOUNTER — Telehealth: Payer: Self-pay

## 2020-07-22 ENCOUNTER — Other Ambulatory Visit: Payer: Self-pay | Admitting: Family Medicine

## 2020-07-22 DIAGNOSIS — F419 Anxiety disorder, unspecified: Secondary | ICD-10-CM

## 2020-07-22 MED ORDER — ALPRAZOLAM 0.25 MG PO TABS: 0.2500 mg | ORAL_TABLET | Freq: Two times a day (BID) | ORAL | 0 refills | Status: DC | PRN

## 2020-07-22 NOTE — Telephone Encounter (Signed)
[facility] patient called Office manager. Patient called office and states that his wife is in skilled nursing facility and is dying. Patient states that he's currently taking "Bupropion". Patient states that he's depressed, and extremely overwhelmed. Patient wants to know if there is any other medication he can take to cope with symptoms that's stronger than the "Bupropion". I offered patient to come into office since Dr.Gupta is out on PAL and there are no clinics at [facility] today. Patient states he'd rather me just send message to Doctor in office to be advised what to do since he prefers to not travel into office.

## 2020-07-22 NOTE — Progress Notes (Signed)
Spoke with Mr Philip [name]. Mutually decided to try low dose alprazolam rather than increase Wellbutrin Alprazolam .25 mg #20

## 2020-07-28 DIAGNOSIS — E538 Deficiency of other specified B group vitamins: Secondary | ICD-10-CM | POA: Diagnosis not present

## 2020-07-28 DIAGNOSIS — M316 Other giant cell arteritis: Secondary | ICD-10-CM | POA: Diagnosis not present

## 2020-07-28 DIAGNOSIS — I10 Essential (primary) hypertension: Secondary | ICD-10-CM | POA: Diagnosis not present

## 2020-07-28 DIAGNOSIS — E785 Hyperlipidemia, unspecified: Secondary | ICD-10-CM | POA: Diagnosis not present

## 2020-07-28 LAB — LIPID PANEL
Cholesterol: 218 — AB (ref 0–200)
HDL: 65 (ref 35–70)
LDL Cholesterol: 139
LDl/HDL Ratio: 3.4
Triglycerides: 73 (ref 40–160)

## 2020-07-28 LAB — CBC AND DIFFERENTIAL
HCT: 37 — AB (ref 41–53)
Hemoglobin: 12.8 — AB (ref 13.5–17.5)
Platelets: 203 (ref 150–399)
WBC: 5

## 2020-07-28 LAB — BASIC METABOLIC PANEL
BUN: 28 — AB (ref 4–21)
CO2: 28 — AB (ref 13–22)
Chloride: 102 (ref 99–108)
Creatinine: 1.5 — AB (ref 0.6–1.3)
Glucose: 109
Potassium: 4.6 (ref 3.4–5.3)
Sodium: 140 (ref 137–147)

## 2020-07-28 LAB — COMPREHENSIVE METABOLIC PANEL
Albumin: 4.2 (ref 3.5–5.0)
Calcium: 9.4 (ref 8.7–10.7)
Globulin: 2.2

## 2020-07-28 LAB — CBC: RBC: 3.89 (ref 3.87–5.11)

## 2020-07-28 LAB — VITAMIN B12: Vitamin B-12: 497

## 2020-07-28 LAB — HEPATIC FUNCTION PANEL
ALT: 11 (ref 10–40)
AST: 12 — AB (ref 14–40)
Alkaline Phosphatase: 57 (ref 25–125)
Bilirubin, Total: 0.2

## 2020-07-29 ENCOUNTER — Non-Acute Institutional Stay: Payer: Medicare Other | Admitting: Internal Medicine

## 2020-07-29 ENCOUNTER — Other Ambulatory Visit: Payer: Self-pay

## 2020-07-29 ENCOUNTER — Encounter: Payer: Self-pay | Admitting: Internal Medicine

## 2020-07-29 ENCOUNTER — Telehealth: Payer: Self-pay | Admitting: Nurse Practitioner

## 2020-07-29 VITALS — BP 106/80 | HR 66 | Temp 96.8°F | Ht 71.0 in | Wt 156.0 lb

## 2020-07-29 DIAGNOSIS — I712 Thoracic aortic aneurysm, without rupture, unspecified: Secondary | ICD-10-CM

## 2020-07-29 DIAGNOSIS — Z85831 Personal history of malignant neoplasm of soft tissue: Secondary | ICD-10-CM | POA: Diagnosis not present

## 2020-07-29 DIAGNOSIS — I890 Lymphedema, not elsewhere classified: Secondary | ICD-10-CM | POA: Diagnosis not present

## 2020-07-29 DIAGNOSIS — G4733 Obstructive sleep apnea (adult) (pediatric): Secondary | ICD-10-CM | POA: Diagnosis not present

## 2020-07-29 DIAGNOSIS — M316 Other giant cell arteritis: Secondary | ICD-10-CM

## 2020-07-29 DIAGNOSIS — E538 Deficiency of other specified B group vitamins: Secondary | ICD-10-CM

## 2020-07-29 DIAGNOSIS — I10 Essential (primary) hypertension: Secondary | ICD-10-CM

## 2020-07-29 DIAGNOSIS — N1832 Chronic kidney disease, stage 3b: Secondary | ICD-10-CM | POA: Diagnosis not present

## 2020-07-29 DIAGNOSIS — E785 Hyperlipidemia, unspecified: Secondary | ICD-10-CM

## 2020-07-29 DIAGNOSIS — F419 Anxiety disorder, unspecified: Secondary | ICD-10-CM

## 2020-07-29 DIAGNOSIS — F3341 Major depressive disorder, recurrent, in partial remission: Secondary | ICD-10-CM

## 2020-07-29 NOTE — Telephone Encounter (Signed)
Called to schedule AWV. Left message to call in to be scheduled.

## 2020-07-29 NOTE — Progress Notes (Signed)
Location:  [place] of Service:  Clinic (12)  Provider:   Code Status:  Goals of Care:  Advanced Directives 07/29/2020  Does Patient Have a Medical Advance Directive? Yes  Type of Advance Directive Out of facility DNR (pink MOST or yellow form);[place];Living will  Does patient want to make changes to medical advance directive? No - Patient declined  Copy of [place] in Chart? Yes - validated most recent copy scanned in chart (See row information)  Would patient like information on creating a medical advance directive? -  Pre-existing out of facility DNR order (yellow form or pink MOST form) -     Chief Complaint  Patient presents with   Medical Management of Chronic Issues    Patient returns to the clinic for follow up. His wife has been in SNF for several weeks, this makes him depressed. He also complains of dizziness upon rising in the mornings. He is also having some right leg pain at night.     HPI: Patient is a 87 y.o. male seen today for medical management of chronic diseases.    Patient came for a follow-up.Very active still drives and exercise   Has Past medical  history of sarcoma of Right lower extremity Follows closely with oncology at [facility] for CT scan of his chest for lung nodules. Has been in remission History of prostate cancer. History of tonsillar cancer Macular degeneration follows with ophthalmologist Aortitis syndrome.  Follows with Dr. Dossie Der who sometimes has to use prednisone as needed B12 deficiency takes injection self administer Hypothyroid follows with Dr. Chalmers Cater History of OSA does not use CPAP as he thinks it does not help. Left Leg Lymphedema   Patient is active issues are Anxiety Depression Was started on Xanax by Dr [name] Heck which helped a lot Has taken 2 times. Takes it only when really needs it Struggling with Wife in SNF and Declining. On Wellbutrin Dizziness Just  started Yoga has noticed som worsening Dizziness with Position  No Vertigo like symptoms   Past Medical History:  Diagnosis Date   Age-related macular degeneration, dry, both eyes    Aortitis syndrome ([place]) rheumotologist-  dr syed   IgG4 syndrome--  (effects abdomine) ,  previously long term use prednisone last taken 07/ 2021   Basal cell carcinoma (BCC) of eye    CKD (chronic kidney disease), stage III [facility])    nephrologist-- dr Johnette Abraham. [name]   Complication of anesthesia    post op acute urinary retention   DDD (degenerative disc disease)    Fatigue    pt evalulation by cardiology--- dr Fransico Michael, note 05-21-2019 in epic,(non cardiac)  normal echo and event monitor showed extra beats of atrial tachycardia , no atrial fib, and rare ecotpy   Full dentures    GERD (gastroesophageal reflux disease)    History of basal cell carcinoma (BCC) excision    12-25-2018 MOH's reconstruction left lower eyelid   History of external beam radiation therapy 2002   prostate cancer  and boost with radioative prostate seed implants   History of prostate cancer DX  2002   S/P EXTERNAL RADIATION/ RADIOACTIVE SEED IMPLANTS  2003   History of squamous cell carcinoma excision    2012--  RIGHT LOWER EXTREM;  2019 LEFT LOWER EXTREMITIY   HTN (hypertension)    followed by pcp   Hyperlipidemia    Hypothyroidism    followed by pcp--- acquired atrophy due to radiation;  previously seen by endocrinologist-  dr balan--- per pt takes his own thyroid med. called NP Thyroid 90 mg daily, does not take synthroid   Large vessel vasculitis ([place])    rheumotologist-  dr syed --- IgG4 related aoritis involving renal arteries ,  pt treated with predisone.  (09-06-2019  per pt no longer prednisone last taken one month ago 07/ 2021, weaned off by pcp per pt request)   Left renal artery stenosis [facility])    nephrologist--- dr [name]--- with aneurysm proximal    Lymphedema of left leg    followed by [facility]---- pt  stated uses compression hose   OSA (obstructive sleep apnea) 09-06-2019  per pt lasted used cpap approx. 2019, stated felt he no longer needed    pt retested 11-13-2016 at [facility] w/ dr ather-- moderate to severe OSA , AHI 16.4/h/  03-24-2017    Pulmonary nodule followed by [facility]   per CT 06-11-2019  in [place] in epic done at [place],  LUL nodule , not mets   Renal cyst, right    Restless legs syndrome (RLS)    S/P radiation therapy     for tonsillar cancer (head and neck) completed 01-26-2012 at [facility]);   and XRT for right lower leg sarcoma --- completed 06-06-2017   Saliva decreased    Sarcoma of lower extremity, right ([place]) oncolgoist-  dr Juluis Rainier ([facility]) & Dr [name] ([facility])   dx 02-14-2017 w/ needle core bx;  high grade pleomorphic spindle cell sarcoma , grade 2 (cT2N0M0); 03-15-2017  radical resection sarcoma tumor right lower leg  and completed radiation 06-06-2017   Self-catheterizes urinary bladder    QID   AND   PRN   Tonsillar cancer ([place]) 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+) (Stage IVA)---- dx oct 2013  ----s/p left tonsillectomy and concurent chemo and radiation/  ended 01-26-2012-- no surgical intervention---  residuals ( dry mouth, decreased saliva)   oncologist at [place]--  dr brizel--  HX OF -- NO RECURRENCE   Urethral false passage 07/2019   Urethral stricture urologist--- dr Jeffie Pollock   chronic---  post urethral dilation's   Urinary retention with incomplete bladder emptying    Vitamin B 12 deficiency 01/28/2011   Vitamin D deficiency     Past Surgical History:  Procedure Laterality Date   BALLOON DILATION N/A 11/10/2014   Procedure: CYSTO BALLOON DILATION AND RETROGRADE URETHROGRAM ;  Surgeon: Bjorn Loser, MD;  Location: [facility];  Service: Urology;  Laterality: N/A;   CATARACT EXTRACTION W/ INTRAOCULAR LENS  IMPLANT, BILATERAL  2012  approx   CYSTO/ BALLOON DILATION  OF  URETHRAL STRICTURE  12-25-2010   CYSTOSCOPY WITH RETROGRADE URETHROGRAM N/A 03/30/2016   Procedure: CYSTOSCOPY WITH RETROGRADE URETHROGRAM AND BALLOON DILATION with cystogram;  Surgeon: Bjorn Loser, MD;  Location: [facility];  Service: Urology;  Laterality: N/A;   CYSTOSCOPY WITH URETHRAL DILATATION  05/31/2011   Procedure: CYSTOSCOPY WITH URETHRAL DILATATION;  Surgeon: Reece Packer, MD;  Location: [place];  Service: Urology;  Laterality: N/A;  BALLOON DILATION   CYSTOSCOPY WITH URETHRAL DILATATION N/A 09/13/2016   Procedure: CYSTOSCOPY WITH URETHRAL BALLOON DILATATION;  Surgeon: Bjorn Loser, MD;  Location: [place];  Service: Urology;  Laterality: N/A;   CYSTOSCOPY WITH URETHRAL DILATATION N/A 03/30/2017   Procedure: CYSTOSCOPY WITH BALLOON URETHRAL DILATATION;  Surgeon: Bjorn Loser, MD;  Location: [facility];  Service: Urology;  Laterality: N/A;   CYSTOSCOPY WITH URETHRAL DILATATION N/A 09/12/2019   Procedure: CYSTOSCOPY WITH URETHRAL DILATATION;  Surgeon: Irine Seal, MD;  Location: [facility];  Service: Urology;  Laterality: N/A;   CYSTOSCOPY/RETROGRADE/URETEROSCOPY N/A 05/22/2012   Procedure: CYSTOSCOPY BALLOON DILATION RETROGRADE URETEROGRAM ;  Surgeon: Reece Packer, MD;  Location: [place];  Service: Urology;  Laterality: N/A;   CYTSO/  DILATATION URETHRAL STRICTURE/  BX PROSTATIC URETHRA/  REMOVAL FOREIGN BODIES  06-25-2010   [facility]   ECTROPION REPAIR Left 06/18/2019   lower eyelid   INGUINAL HERNIA REPAIR Right 2000   MOHS SURGERY  01/ 2019    [facility]   left lower leg for [facility]   MOHS SURGERY  12/25/2018   for [facility] of left lower eyelid;  the second stage Hughs flap release 01-09-2019   RADICAL RESECTION OF SARCOMA TUMOR   03-15-2017   [facility]   RIGHT LOWER LEG , CALF AREA   RADIOACTIVE SEED IMPLANTS, PROSTATE  JAN  2003   SKIN LESION EXCISION  07/2012   MOST  right  shoulder   TONSILLECTOMY Left 11-10-2011  @[facility]    tonsillar cancer   TRANSURETHRAL INCISION OF BLADDER NECK N/A 09/12/2019   Procedure: TRANSURETHRAL INCISION OF BLADDER NECK;  Surgeon: Irine Seal, MD;  Location: [place];  Service: Urology;  Laterality: N/A;    Allergies  Allergen Reactions   Cefixime Rash   Contrast Media [Iodinated Diagnostic Agents] Other (See Comments)    Avoid due to renal disease   Macrobid [Nitrofurantoin Macrocrystal] Swelling   Nsaids Other (See Comments)    Has been told no NSAIDs, Ibuprofen Renal insufficiency hx.    Cephalosporins Rash    Outpatient Encounter Medications as of 07/29/2020  Medication Sig   ALPRAZolam (XANAX) 0.25 MG tablet Take 1 tablet (0.25 mg total) by mouth 2 (two) times daily as needed for anxiety.   buPROPion (WELLBUTRIN) 100 MG tablet Take 1 tablet by mouth twice daily (Patient taking differently: 100 mg 3 (three) times daily.)   Cranberry-Vitamin C (AZO CRANBERRY URINARY TRACT PO) Take by mouth.   cyanocobalamin (,VITAMIN B-12,) 1000 MCG/ML injection Inject 1 mL (1,000 mcg total) into the muscle every 14 (fourteen) days.   Garlic (GARLIQUE PO) Take by mouth every other day.   lisinopril (ZESTRIL) 10 MG tablet Take 1 tablet by mouth once daily (Patient taking differently: 5 mg daily.)   RESVERATROL PO Take 100 mg by mouth daily.    thyroid (ARMOUR) 90 MG tablet Take 90 mg by mouth daily.   TURMERIC CURCUMIN PO Take 2 tablets by mouth daily.    UNABLE TO FIND [place] take once daily   UNABLE TO FIND [place] take 4 tablets once daily   UNABLE TO FIND Comprehensive immune support take 2 tablets once daily   UNABLE TO FIND Modified citrus pectin take 4 tablets once daily   UNABLE TO FIND Celery seed take 2 tablets by mouth once daily   UNABLE TO FIND Maxivision multiple vitamin for eyes   Cyanocobalamin-Methylcobalamin 600-600 MCG SUBL Place under the tongue daily. (Patient not taking: Reported on  07/29/2020)   Multiple Vitamins-Minerals (ICAPS AREDS 2 PO) Take 2 tablets by mouth daily. (Patient not taking: No sig reported)   No facility-administered encounter medications on file as of 07/29/2020.    Review of Systems:  Review of Systems  Constitutional: Negative.   HENT: Negative.    Respiratory: Negative.  Cardiovascular:  Positive for leg swelling.  Gastrointestinal: Negative.   Genitourinary: Negative.   Musculoskeletal:  Positive for arthralgias and myalgias.  Skin:  Positive for rash.  Neurological:  Positive for dizziness.  Psychiatric/Behavioral:  Positive for dysphoric mood. The patient is nervous/anxious.    Health Maintenance  Topic Date Due   Zoster Vaccines- Shingrix (1 of 2) Never done   COVID-19 Vaccine (4 - Booster for Moderna series) 03/04/2020   TETANUS/TDAP  08/06/2020 (Originally 01/17/2013)   INFLUENZA VACCINE  08/17/2020   PNA vac Low Risk Adult  Completed   HPV VACCINES  Aged Out    Physical Exam: Vitals:   07/29/20 1058  BP: 106/80  Pulse: 66  Temp: (!) 96.8 F (36 C)  SpO2: 96%  Weight: 156 lb (70.8 kg)  Height: 5\' 11"  (1.803 m)   Body mass index is 21.76 kg/m. Physical Exam Constitutional: Oriented to person, place, and time. Well-developed and well-nourished.  HENT:  Head: Normocephalic.  Mouth/Throat: Oropharynx is clear and moist.  Eyes: Pupils are equal, round, and reactive to light.  Neck: Neck supple.  Cardiovascular: Normal rate and normal heart sounds.  No murmur heard. Pulmonary/Chest: Effort normal and breath sounds normal. No respiratory distress. No wheezes. She has no rales.  Abdominal: Soft. Bowel sounds are normal. No distension. There is no tenderness. There is no rebound.  Musculoskeletal: Left Leg Edema Lymphadenopathy: none Neurological: Alert and oriented to person, place, and time.  Skin: Skin is warm and dry.  Psychiatric: Normal mood and affect. Behavior is normal. Thought content normal.   Labs  reviewed: Basic Metabolic Panel: Recent Labs    09/12/19 1100 07/28/20 0000  NA 142 140  K 4.2 4.6  CL 103 102  CO2  --  28*  GLUCOSE 102*  --   BUN 21 28*  CREATININE 1.40* 1.5*  CALCIUM  --  9.4   Liver Function Tests: Recent Labs    07/28/20 0000  AST 12*  ALT 11  ALKPHOS 57  ALBUMIN 4.2   No results for input(s): LIPASE, AMYLASE in the last 8760 hours. No results for input(s): AMMONIA in the last 8760 hours. CBC: Recent Labs    09/12/19 1100 07/28/20 0000  WBC  --  5.0  HGB 14.3 12.8*  HCT 42.0 37*  PLT  --  203   Lipid Panel: Recent Labs    07/28/20 0000  CHOL 218*  HDL 65  LDLCALC 139  TRIG 73   No results found for: HGBA1C  Procedures since last visit: No results found.  Assessment/Plan Essential hypertension BP dropping after taking Lisinopril  With c/o Dizziness with Position change Told to reduce the dose to 5 mg QD He checks his BP twice a day  Anxiety due to Declining health for his Wife Xanax helped Has only taken twice Will continue to keep PRN Risk Benefits Explained  Lymphedema of left leg Negative work up in past Uses Ted hoses PRN  Large vessel vasculitis ([place]) Follows with Dr Dossie Der and Uses Prednisone PRN OSA (obstructive sleep apnea) Does not use CPAP as it has not helped History of sarcoma In Remission  Follows with [facility] Thoracic aortic aneurysm without rupture ([place]) Follows at [facility] Recurrent major depressive disorder, in partial remission ([place]) Doing well on Wellbutrin BID now Hyperlipidemia, unspecified hyperlipidemia type Discussed about Pros and Cons of Statin He is not interested right now B12 deficiency Levels Normal Self Injects B12 Stage 3b chronic kidney disease (HCC) Creat Stable Has Seen Dr  [name] Before Hypothyroid Follows with Dr Chalmers Cater  Labs/tests ordered:  * No order type specified * Next appt:  07/29/2020

## 2020-07-30 ENCOUNTER — Encounter (INDEPENDENT_AMBULATORY_CARE_PROVIDER_SITE_OTHER): Payer: Self-pay

## 2020-07-30 DIAGNOSIS — H019 Unspecified inflammation of eyelid: Secondary | ICD-10-CM | POA: Diagnosis not present

## 2020-07-30 DIAGNOSIS — L218 Other seborrheic dermatitis: Secondary | ICD-10-CM | POA: Diagnosis not present

## 2020-07-30 DIAGNOSIS — I781 Nevus, non-neoplastic: Secondary | ICD-10-CM | POA: Diagnosis not present

## 2020-07-30 DIAGNOSIS — Z85828 Personal history of other malignant neoplasm of skin: Secondary | ICD-10-CM | POA: Diagnosis not present

## 2020-08-04 ENCOUNTER — Other Ambulatory Visit: Payer: Self-pay | Admitting: *Deleted

## 2020-08-04 NOTE — Telephone Encounter (Signed)
Pharmacy requested refill.  °Pended Rx and sent to Dr. Gupta for approval due to HIGH ALERT Warning.  °

## 2020-08-05 NOTE — Telephone Encounter (Signed)
Re-Pended for Dr. Lyndel Safe.

## 2020-08-07 MED ORDER — BUPROPION HCL 100 MG PO TABS
100.0000 mg | ORAL_TABLET | Freq: Two times a day (BID) | ORAL | 1 refills | Status: DC
Start: 2020-08-07 — End: 2020-10-05

## 2020-08-10 NOTE — Telephone Encounter (Signed)
Please refer to Dr.Miller orders/notes in regards to patient care.

## 2020-08-12 ENCOUNTER — Encounter: Payer: Self-pay | Admitting: Family

## 2020-08-12 ENCOUNTER — Other Ambulatory Visit: Payer: Self-pay

## 2020-08-12 ENCOUNTER — Ambulatory Visit (INDEPENDENT_AMBULATORY_CARE_PROVIDER_SITE_OTHER): Payer: Medicare Other | Admitting: Family

## 2020-08-12 ENCOUNTER — Telehealth: Payer: Self-pay

## 2020-08-12 DIAGNOSIS — F3341 Major depressive disorder, recurrent, in partial remission: Secondary | ICD-10-CM

## 2020-08-12 DIAGNOSIS — R634 Abnormal weight loss: Secondary | ICD-10-CM | POA: Diagnosis not present

## 2020-08-12 DIAGNOSIS — E034 Atrophy of thyroid (acquired): Secondary | ICD-10-CM | POA: Diagnosis not present

## 2020-08-12 NOTE — Telephone Encounter (Signed)
Mr. dodger, shy are scheduled for a virtual visit with your provider today.    Just as we do with appointments in the office, we must obtain your consent to participate.  Your consent will be active for this visit and any virtual visit you may have with one of our providers in the next 365 days.    If you have a MyChart account, I can also send a copy of this consent to you electronically.  All virtual visits are billed to your insurance company just like a traditional visit in the office.  As this is a virtual visit, video technology does not allow for your provider to perform a traditional examination.  This may limit your provider's ability to fully assess your condition.  If your provider identifies any concerns that need to be evaluated in person or the need to arrange testing such as labs, EKG, etc, we will make arrangements to do so.    Although advances in technology are sophisticated, we cannot ensure that it will always work on either your end or our end.  If the connection with a video visit is poor, we may have to switch to a telephone visit.  With either a video or telephone visit, we are not always able to ensure that we have a secure connection.   I need to obtain your verbal consent now.   Are you willing to proceed with your visit today?   Guhan Chizmar Kister has provided verbal consent on 08/12/2020 for a virtual visit (video or telephone).   [place], [place] 08/12/2020  9:00 AM

## 2020-08-12 NOTE — Patient Instructions (Signed)
-   Please get Thyroid level lab drawn at [facility] tomorrow 08/13/2020.Lab order has been faxed to facility Nurse.Will call you with results.   - check weight daily and Notify provider for any abrupt weight loss

## 2020-08-12 NOTE — Progress Notes (Signed)
This service is provided via telemedicine  No vital signs collected/recorded due to the encounter was a telemedicine visit.   Location of patient (ex: home, work):  Home.  Patient consents to a telephone visit:  Yes.  Location of the provider (ex: office, home):  [employer].  Name of any referring provider:  Virgie Dad, MD   Names of all persons participating in the telemedicine service and their role in the encounter:  Patient, Heriberto Antigua, [place], [place], Webb Silversmith, NP.    Time spent on call: 8 minutes spent on the phone with Medical Assistant.     Provider: Norlene Lanes FNP-C  Virgie Dad, MD  Patient Care Team: Virgie Dad, MD as PCP - General (Internal Medicine) Bjorn Loser, MD as Consulting Physician (Urology) [facility] Brizel, Youlanda Roys, MD (Radiation Oncology) Jacelyn Pi, MD as Consulting Physician (Endocrinology) Cherre Blanc, Mayford Knife, MD as Referring Physician (Orthopedic Surgery) Valinda Party, MD (Rheumatology)  Extended Emergency Contact Information Primary Emergency Contact: Bame,Roberta Address: [address]          [place], [place] [address] Johnnette Litter of [place] Phone: [phone] Mobile Phone: [phone] Relation: Spouse Secondary Emergency Contact: Bjorn Loser, [place] [address] Johnnette Litter of [place] Phone: [phone] Mobile Phone: [phone] Relation: Daughter  Code Status:  DNR Goals of care: Advanced Directive information Advanced Directives 08/12/2020  Does Patient Have a Medical Advance Directive? Yes  Type of Paramedic of [place];Living will;Out of facility DNR (pink MOST or yellow form)  Does patient want to make changes to medical advance directive? No - Patient declined  Copy of [place] in Chart? Yes - validated most recent copy scanned in chart (See row information)  Would patient  like information on creating a medical advance directive? -  Pre-existing out of facility DNR order (yellow form or pink MOST form) -     Chief Complaint  Patient presents with   Acute Visit    Patient complains of weight loss.     HPI:  Pt is a 87 y.o. male seen today for an acute visit for evaluation of weight loss.states weight down to 150 lbs.on chart review,he was recently seen by PCP Dr.Gupta 7/ 13/2022 weight was 156 lbs. His wife died recently but states coping well.takes Xanax as needed and on Wellbutrin.states doing much better. Drinks protein supplement  ENU 20 gm but sometimes forgets.  Has had no recent acute illness,fatigue or cough. Medication reviewed he is on Thyroid ( Armour ) 90 mg tablet daily.last TSH level 4.21 (01/31/2019).    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 [name]   Complication of anesthesia    post op acute urinary retention   DDD (degenerative disc disease)    Fatigue    pt evalulation by cardiology--- dr Fransico Michael, note 05-21-2019 in epic,(non cardiac)  normal echo and event monitor showed extra beats of atrial tachycardia , no atrial fib, and rare ecotpy   Full dentures    GERD (gastroesophageal reflux disease)    History of basal  cell carcinoma (BCC) excision    12-25-2018 MOH's reconstruction left lower eyelid   History of external beam radiation therapy 2002   prostate cancer  and boost with radioative prostate seed implants   History of prostate cancer DX  2002   S/P EXTERNAL RADIATION/ RADIOACTIVE SEED IMPLANTS  2003   History of squamous cell carcinoma excision    2012--  RIGHT LOWER EXTREM;  2019 LEFT LOWER EXTREMITIY   HTN  (hypertension)    followed by pcp   Hyperlipidemia    Hypothyroidism    followed by pcp--- acquired atrophy due to radiation;  previously seen by endocrinologist-  dr Chalmers Cater--- per pt takes his own thyroid med. called NP Thyroid 90 mg daily, does not take synthroid   Large vessel vasculitis ([place])    rheumotologist-  dr syed --- IgG4 related aoritis involving renal arteries ,  pt treated with predisone.  (09-06-2019  per pt no longer prednisone last taken one month ago 07/ 2021, weaned off by pcp per pt request)   Left renal artery stenosis [facility])    nephrologist--- dr [name]--- with aneurysm proximal    Lymphedema of left leg    followed by [facility]---- pt stated uses compression hose   OSA (obstructive sleep apnea) 09-06-2019  per pt lasted used cpap approx. 2019, stated felt he no longer needed    pt retested 11-13-2016 at [facility] w/ dr ather-- moderate to severe OSA , AHI 16.4/h/  03-24-2017    Pulmonary nodule followed by [facility]   per CT 06-11-2019  in [place] in epic done at [place],  LUL nodule , not mets   Renal cyst, right    Restless legs syndrome (RLS)    S/P radiation therapy     for tonsillar cancer (head and neck) completed 01-26-2012 at [facility]);   and XRT for right lower leg sarcoma --- completed 06-06-2017   Saliva decreased    Sarcoma of lower extremity, right ([place]) oncolgoist-  dr Juluis Rainier ([facility]) & Dr [name] ([facility])   dx 02-14-2017 w/ needle core bx;  high grade pleomorphic spindle cell sarcoma , grade 2 (cT2N0M0); 03-15-2017  radical resection sarcoma tumor right lower leg  and completed radiation 06-06-2017   Self-catheterizes urinary bladder    QID   AND   PRN   Tonsillar cancer ([place]) 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+) (Stage IVA)---- dx oct 2013  ----s/p left tonsillectomy and concurent chemo and radiation/  ended 01-26-2012-- no surgical intervention---  residuals ( dry mouth,  decreased saliva)   oncologist at [place]--  dr brizel--  HX OF -- NO RECURRENCE   Urethral false passage 07/2019   Urethral stricture urologist--- dr Jeffie Pollock   chronic---  post urethral dilation's   Urinary retention with incomplete bladder emptying    Vitamin B 12 deficiency 01/28/2011   Vitamin D deficiency    Past Surgical History:  Procedure Laterality Date   BALLOON DILATION N/A 11/10/2014   Procedure: CYSTO BALLOON DILATION AND RETROGRADE URETHROGRAM ;  Surgeon: Bjorn Loser, MD;  Location: [facility];  Service: Urology;  Laterality: N/A;   CATARACT EXTRACTION W/ INTRAOCULAR LENS  IMPLANT, BILATERAL  2012  approx   CYSTO/ BALLOON DILATION OF  URETHRAL STRICTURE  12-25-2010   CYSTOSCOPY WITH RETROGRADE URETHROGRAM N/A 03/30/2016   Procedure: CYSTOSCOPY WITH RETROGRADE URETHROGRAM AND BALLOON DILATION with cystogram;  Surgeon: Bjorn Loser, MD;  Location: [facility];  Service: Urology;  Laterality: N/A;   CYSTOSCOPY WITH URETHRAL DILATATION  05/31/2011   Procedure: CYSTOSCOPY WITH URETHRAL DILATATION;  Surgeon: Reece Packer, MD;  Location: [place];  Service: Urology;  Laterality: N/A;  BALLOON DILATION   CYSTOSCOPY WITH URETHRAL DILATATION N/A 09/13/2016   Procedure: CYSTOSCOPY WITH URETHRAL BALLOON DILATATION;  Surgeon: Bjorn Loser, MD;  Location: [place];  Service: Urology;  Laterality: N/A;   CYSTOSCOPY WITH URETHRAL DILATATION N/A 03/30/2017   Procedure: CYSTOSCOPY WITH BALLOON URETHRAL DILATATION;  Surgeon: Bjorn Loser, MD;  Location: [place];  Service: Urology;  Laterality: N/A;   CYSTOSCOPY WITH URETHRAL DILATATION N/A 09/12/2019   Procedure: CYSTOSCOPY WITH URETHRAL DILATATION;  Surgeon: Irine Seal, MD;  [facility];  Service: Urology;  Laterality: N/A;   CYSTOSCOPY/RETROGRADE/URETEROSCOPY N/A 05/22/2012   Procedure: CYSTOSCOPY BALLOON DILATION  RETROGRADE URETEROGRAM ;  Surgeon: Reece Packer, MD;  Location: [place];  Service: Urology;  Laterality: N/A;   CYTSO/  DILATATION URETHRAL STRICTURE/  BX PROSTATIC URETHRA/  REMOVAL FOREIGN BODIES  06-25-2010   [facility]   ECTROPION REPAIR Left 06/18/2019   lower eyelid   INGUINAL HERNIA REPAIR Right 2000   MOHS SURGERY  01/ 2019    [facility]   left lower leg for [facility]   MOHS SURGERY  12/25/2018   for [facility] of left lower eyelid;  the second stage Hughs flap release 01-09-2019   RADICAL RESECTION OF SARCOMA TUMOR   03-15-2017   [facility]   RIGHT LOWER LEG , CALF AREA   RADIOACTIVE SEED IMPLANTS, PROSTATE  JAN  2003   SKIN LESION EXCISION  07/2012   MOST  right shoulder   TONSILLECTOMY Left 11-10-2011  '@[facility]'$    tonsillar cancer   TRANSURETHRAL INCISION OF BLADDER NECK N/A 09/12/2019   Procedure: TRANSURETHRAL INCISION OF BLADDER NECK;  Surgeon: Irine Seal, MD;  Location: [place];  Service: Urology;  Laterality: N/A;    Allergies  Allergen Reactions   Cefixime Rash   Contrast Media [Iodinated Diagnostic Agents] Other (See Comments)    Avoid due to renal disease   Macrobid [Nitrofurantoin Macrocrystal] Swelling   Nsaids Other (See Comments)    Has been told no NSAIDs, Ibuprofen Renal insufficiency hx.    Cephalosporins Rash    Outpatient Encounter Medications as of 08/12/2020  Medication Sig   ALPRAZolam (XANAX) 0.25 MG tablet Take 1 tablet (0.25 mg total) by mouth 2 (two) times daily as needed for anxiety.   buPROPion (WELLBUTRIN) 100 MG tablet Take 1 tablet (100 mg total) by mouth 2 (two) times daily.   Cranberry-Vitamin C (AZO CRANBERRY URINARY TRACT PO) Take by mouth.   cyanocobalamin (,VITAMIN B-12,) 1000 MCG/ML injection Inject 1 mL (1,000 mcg total) into the muscle every 14 (fourteen) days.   Cyanocobalamin-Methylcobalamin 600-600 MCG SUBL Place under the tongue daily.   Garlic (GARLIQUE PO) Take by mouth every other day.   lisinopril (ZESTRIL) 10 MG  tablet Take 1 tablet by mouth once daily   RESVERATROL PO Take 100 mg by mouth daily.    thyroid (ARMOUR) 90 MG tablet Take 90 mg by mouth daily.   TURMERIC CURCUMIN PO Take 2 tablets by mouth daily.    UNABLE TO FIND [place] Bone take once daily   UNABLE TO FIND [place] take 4 tablets once daily   UNABLE TO FIND Comprehensive immune support take 2 tablets once daily   UNABLE TO FIND Modified citrus  pectin take 4 tablets once daily   UNABLE TO FIND Celery seed take 2 tablets by mouth once daily   UNABLE TO FIND Maxivision multiple vitamin for eyes   [DISCONTINUED] Multiple Vitamins-Minerals (ICAPS AREDS 2 PO) Take 2 tablets by mouth daily. (Patient not taking: No sig reported)   No facility-administered encounter medications on file as of 08/12/2020.    Review of Systems  Constitutional:  Positive for unexpected weight change. Negative for appetite change, chills, fatigue and fever.       Weight loss   HENT:  Negative for congestion, dental problem, postnasal drip, rhinorrhea, sinus pressure, sinus pain, sneezing, sore throat, tinnitus and trouble swallowing.   Eyes:  Negative for pain, discharge, redness and itching.  Respiratory:  Negative for cough, chest tightness, shortness of breath and wheezing.   Cardiovascular:  Negative for chest pain, palpitations and leg swelling.  Gastrointestinal:  Negative for abdominal distention, abdominal pain, blood in stool, constipation, diarrhea, nausea and vomiting.  Endocrine: Negative for cold intolerance, heat intolerance, polydipsia, polyphagia and polyuria.  Musculoskeletal:  Negative for arthralgias, back pain, gait problem, joint swelling, myalgias, neck pain and neck stiffness.  Skin:  Negative for color change, pallor, rash and wound.  Neurological:  Negative for dizziness, syncope, speech difficulty, weakness, light-headedness, numbness and headaches.  Hematological:  Does not bruise/bleed easily.  Psychiatric/Behavioral:  Negative for  agitation, behavioral problems, confusion, hallucinations, self-injury, sleep disturbance and suicidal ideas. The patient is not nervous/anxious.        Depression and anxiety symptoms stable    Immunization History  Administered Date(s) Administered   Moderna Sars-Covid-2 Vaccination 01/29/2019, 02/27/2019, 12/03/2019   Pneumococcal Conjugate-13 05/06/2015   Pneumococcal Polysaccharide-23 01/18/2003   Td 01/18/2003   Pertinent  Health Maintenance Due  Topic Date Due   INFLUENZA VACCINE  08/17/2020   PNA vac Low Risk Adult  Completed   Fall Risk  08/12/2020 07/29/2020 04/22/2020 12/11/2019 11/25/2019  Falls in the past year? 0 0 0 0 0  Number falls in past yr: 0 0 0 0 0  Injury with Fall? 0 - - 0 0  Comment - - - - -  Risk for fall due to : No Fall Risks - - - -  Follow up Falls evaluation completed - - - -   Functional Status Survey:    There were no vitals filed for this visit. There is no height or weight on file to calculate BMI. Physical Exam  Unable to complete on Telephone visit.   Labs reviewed: Recent Labs    09/12/19 1100 07/28/20 0000  NA 142 140  K 4.2 4.6  CL 103 102  CO2  --  28*  GLUCOSE 102*  --   BUN 21 28*  CREATININE 1.40* 1.5*  CALCIUM  --  9.4   Recent Labs    07/28/20 0000  AST 12*  ALT 11  ALKPHOS 57  ALBUMIN 4.2   Recent Labs    09/12/19 1100 07/28/20 0000  WBC  --  5.0  HGB 14.3 12.8*  HCT 42.0 37*  PLT  --  203   Lab Results  Component Value Date   TSH 4.21 01/31/2019   No results found for: HGBA1C Lab Results  Component Value Date   CHOL 218 (A) 07/28/2020   HDL 65 07/28/2020   LDLCALC 139 07/28/2020   TRIG 73 07/28/2020    Significant Diagnostic Results in last 30 days:  No results found.  Assessment/Plan  1. Weight loss Has  had a 6 lbs weight loss since recently seen by PCP Dr.Gupta 07/28/2020 depression and anxiety symptoms stable.Not skipping meals but sometimes forgets to drink his protein supplements. -  encouraged to drink protein supplement  - check weight daily and notify provider for any abrupt weight loss - will check TSH level labs to be drawn at [facility].[place] fax orders to [facility].   2. Recurrent major depressive disorder, in partial remission (HCC) Symptoms stable  - continue on Bupropion   3. Hypothyroidism due to acquired atrophy of thyroid Lab Results  Component Value Date   TSH 4.21 01/31/2019  Continue on Thyroid 90 mg tablet daily  - will check TSH level labs to be drawn at [facility].[place] to fax orders to [facility] Nurse.    Family/ staff Communication: Reviewed plan of care with patient verbalized understanding.  Labs/tests ordered: TSH level order faxed to [facility]   Next Appointment: Has appointment 11/30/2020 with Franco Collet   I connected with  Baird Lyons Coover on 08/12/20 by a Telephone enabled telemedicine application and verified that I am speaking with the correct person using two identifiers.   I discussed the limitations of evaluation and management by telemedicine. The patient expressed understanding and agreed to proceed.   Spent 15 minutes of non-face to face with patient    Sandrea Hughs, NP

## 2020-08-12 NOTE — Telephone Encounter (Signed)
Patient called this morning requesting appointment at [facility]. Dr.Gupta is completely booked this morning. Patient didn't want to come into office for appointment. Patient was offered Telehealth visit with Marlowe Sax, NP this morning 08/12/2020 at 9am. Patient complains of rapid weight loss. Last time I spoke to patient was 07/22/2020 and routed message to [place]. Patient spoke one on one with Dr.Miller and was given a prescription. During that time patient wife was dying. After speaking to patient this morning. He states that wife died a couple of days ago and he's really going through a lot. Message routed to Marlowe Sax, NP

## 2020-08-13 DIAGNOSIS — E039 Hypothyroidism, unspecified: Secondary | ICD-10-CM | POA: Diagnosis not present

## 2020-08-13 LAB — TSH: TSH: 2.03 (ref 0.41–5.90)

## 2020-09-01 ENCOUNTER — Telehealth: Payer: Self-pay

## 2020-09-01 NOTE — Telephone Encounter (Signed)
Patient was advised pre Dr. Lyndel Safe to schedule appointment and the appointment was scheduled for 09/07/2020 with Royal Hawthorn, NP.

## 2020-09-01 NOTE — Telephone Encounter (Signed)
Patient of [facility]. He called requesting a medication to help him sleep better at night. Patient reports since wife passed a month ago he has had trouble sleeping. He is aware that appointment may need to be made.Please advise.

## 2020-09-07 ENCOUNTER — Non-Acute Institutional Stay: Payer: Medicare Other | Admitting: Adult Health

## 2020-09-07 ENCOUNTER — Encounter: Payer: Self-pay | Admitting: Adult Health

## 2020-09-07 ENCOUNTER — Other Ambulatory Visit: Payer: Self-pay

## 2020-09-07 VITALS — BP 126/84 | HR 67 | Temp 97.4°F | Ht 71.0 in | Wt 156.6 lb

## 2020-09-07 DIAGNOSIS — L84 Corns and callosities: Secondary | ICD-10-CM | POA: Diagnosis not present

## 2020-09-07 DIAGNOSIS — G4733 Obstructive sleep apnea (adult) (pediatric): Secondary | ICD-10-CM | POA: Diagnosis not present

## 2020-09-07 DIAGNOSIS — F5101 Primary insomnia: Secondary | ICD-10-CM

## 2020-09-07 MED ORDER — TRAZODONE HCL 50 MG PO TABS: 25.0000 mg | ORAL_TABLET | Freq: Every evening | ORAL | 1 refills | Status: DC | PRN

## 2020-09-07 NOTE — Progress Notes (Signed)
[facility]  Provider:   Cindi Carbon, [place] [phone]   Code Status: DNR Goals of Care:  Advanced Directives 09/07/2020  Does Patient Have a Medical Advance Directive? Yes  Type of Paramedic of [place];Living will;Out of facility DNR (pink MOST or yellow form)  Does patient want to make changes to medical advance directive? No - Patient declined  Copy of [place] in Chart? Yes - validated most recent copy scanned in chart (See row information)  Would patient like information on creating a medical advance directive? -  Pre-existing out of facility DNR order (yellow form or pink MOST form) -     Chief Complaint  Patient presents with   Quality Metric Gaps   Acute Visit    Right toe issue and difficulty sleeping    HPI: Patient is a 87 y.o. male seen today for an acute visit for right toe issue and difficulty sleeping.   Has had difficulty sleeping for the past 15 years. Was diagnosed with with sleep apnea in 2010 and tried cpap but did not see any difference in feelings of grogginess upon waking. He tried again and had a sleep apnea test which showed sleep apnea in 2016.   He is trying an oral appliance to help the palate.   He wakes up at 2 am and has trouble going back to sleep. He gets 4-5 hrs of sleep per night. Goes to bed around 10p.  Avoid screens before med. He recently lost his wife and is still in the grieving process.  Takes wellbutrin for depression.  He has an area of concern on the right foot 5th toe that is mildly uncomfortable.  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 [name]    Complication of anesthesia    post op acute urinary retention   DDD (degenerative disc disease)    Fatigue    pt evalulation by cardiology--- dr Fransico Michael, note 05-21-2019 in epic,(non cardiac)  normal echo and event monitor showed extra beats of atrial tachycardia , no atrial fib, and rare ecotpy   Full dentures    GERD (gastroesophageal reflux disease)    History of basal cell carcinoma (BCC) excision    12-25-2018 MOH's reconstruction left lower eyelid   History of external beam radiation therapy 2002   prostate cancer  and boost with radioative prostate seed implants   History of prostate cancer DX  2002   S/P EXTERNAL RADIATION/ RADIOACTIVE SEED IMPLANTS  2003   History of squamous cell carcinoma excision    2012--  RIGHT LOWER EXTREM;  2019 LEFT LOWER EXTREMITIY   HTN (hypertension)    followed by pcp   Hyperlipidemia    Hypothyroidism    followed by pcp--- acquired atrophy due to radiation;  previously seen by endocrinologist-  dr Chalmers Cater--- per pt takes his own thyroid med. called NP Thyroid 90 mg daily, does not take synthroid   Large vessel vasculitis ([place])    rheumotologist-  dr syed --- IgG4 related aoritis involving renal arteries ,  pt treated with predisone.  (09-06-2019  per pt no longer prednisone last  taken one month ago 07/ 2021, weaned off by pcp per pt request)   Left renal artery stenosis [facility])    nephrologist--- dr [name]--- with aneurysm proximal    Lymphedema of left leg    followed by [facility]---- pt stated uses compression hose   OSA (obstructive sleep apnea) 09-06-2019  per pt lasted used cpap approx. 2019, stated felt he no longer needed    pt retested 11-13-2016 at [facility] w/ dr ather-- moderate to severe OSA , AHI 16.4/h/  03-24-2017    Pulmonary nodule followed by [facility]   per CT 06-11-2019  in [place] in epic done at [facility],  LUL nodule , not mets   Renal cyst, right    Restless legs syndrome (RLS)    S/P  radiation therapy     for tonsillar cancer (head and neck) completed 01-26-2012 at [facility]);   and XRT for right lower leg sarcoma --- completed 06-06-2017   Saliva decreased    Sarcoma of lower extremity, right ([place]) oncolgoist-  dr Juluis Rainier ([facility]) & Dr [name] ([facility])   dx 02-14-2017 w/ needle core bx;  high grade pleomorphic spindle cell sarcoma , grade 2 (cT2N0M0); 03-15-2017  radical resection sarcoma tumor right lower leg  and completed radiation 06-06-2017   Self-catheterizes urinary bladder    QID   AND   PRN   Tonsillar cancer ([place]) 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+) (Stage IVA)---- dx oct 2013  ----s/p left tonsillectomy and concurent chemo and radiation/  ended 01-26-2012-- no surgical intervention---  residuals ( dry mouth, decreased saliva)   oncologist at [facility]--  dr brizel--  HX OF -- NO RECURRENCE   Urethral false passage 07/2019   Urethral stricture urologist--- dr Jeffie Pollock   chronic---  post urethral dilation's   Urinary retention with incomplete bladder emptying    Vitamin B 12 deficiency 01/28/2011   Vitamin D deficiency     Past Surgical History:  Procedure Laterality Date   BALLOON DILATION N/A 11/10/2014   Procedure: CYSTO BALLOON DILATION AND RETROGRADE URETHROGRAM ;  Surgeon: Bjorn Loser, MD;  Location: [facility];  Service: Urology;  Laterality: N/A;   CATARACT EXTRACTION W/ INTRAOCULAR LENS  IMPLANT, BILATERAL  2012  approx   CYSTO/ BALLOON DILATION OF  URETHRAL STRICTURE  12-25-2010   CYSTOSCOPY WITH RETROGRADE URETHROGRAM N/A 03/30/2016   Procedure: CYSTOSCOPY WITH RETROGRADE URETHROGRAM AND BALLOON DILATION with cystogram;  Surgeon: Bjorn Loser, MD;  Location: [facility];  Service: Urology;  Laterality: N/A;   CYSTOSCOPY WITH URETHRAL DILATATION  05/31/2011   Procedure: CYSTOSCOPY WITH URETHRAL DILATATION;  Surgeon: Reece Packer, MD;  Location: [place];  Service: Urology;  Laterality: N/A;  BALLOON DILATION   CYSTOSCOPY WITH URETHRAL DILATATION N/A 09/13/2016   Procedure: CYSTOSCOPY WITH URETHRAL BALLOON DILATATION;  Surgeon: Bjorn Loser, MD;  Location: [place];  Service: Urology;  Laterality: N/A;   CYSTOSCOPY WITH URETHRAL DILATATION N/A 03/30/2017   Procedure: CYSTOSCOPY WITH BALLOON URETHRAL DILATATION;  Surgeon: Bjorn Loser, MD;  Location: [place];  Service: Urology;  Laterality: N/A;   CYSTOSCOPY WITH URETHRAL DILATATION N/A 09/12/2019   Procedure: CYSTOSCOPY WITH URETHRAL DILATATION;  Surgeon: Irine Seal, MD;  Location: [facility];  Service: Urology;  Laterality: N/A;   CYSTOSCOPY/RETROGRADE/URETEROSCOPY N/A 05/22/2012   Procedure: CYSTOSCOPY BALLOON DILATION RETROGRADE URETEROGRAM ;  Surgeon: Reece Packer, MD;  Location: [place];  Service: Urology;  Laterality: N/A;   CYTSO/  DILATATION URETHRAL STRICTURE/  BX PROSTATIC URETHRA/  REMOVAL FOREIGN BODIES  06-25-2010   [facility]   ECTROPION REPAIR Left 06/18/2019   lower eyelid   INGUINAL HERNIA REPAIR Right 2000   MOHS SURGERY  01/ 2019    [facility]   left lower leg for [facility]   MOHS SURGERY  12/25/2018   for [facility] of left lower eyelid;  the second stage Hughs flap release 01-09-2019   RADICAL RESECTION OF SARCOMA TUMOR   03-15-2017   [facility]   RIGHT LOWER LEG , CALF AREA   RADIOACTIVE SEED IMPLANTS, PROSTATE  JAN  2003   SKIN LESION EXCISION  07/2012   MOST  right shoulder   TONSILLECTOMY Left 11-10-2011  '@[facility]'$    tonsillar cancer   TRANSURETHRAL INCISION OF BLADDER NECK N/A 09/12/2019   Procedure: TRANSURETHRAL INCISION OF BLADDER NECK;  Surgeon: Irine Seal, MD;  Location: [place];  Service: Urology;  Laterality: N/A;    Allergies  Allergen Reactions   Cefixime Rash   Contrast Media [Iodinated Diagnostic Agents] Other (See Comments)    Avoid due to renal disease   Macrobid  [Nitrofurantoin Macrocrystal] Swelling   Nsaids Other (See Comments)    Has been told no NSAIDs, Ibuprofen Renal insufficiency hx.    Cephalosporins Rash    Outpatient Encounter Medications as of 09/07/2020  Medication Sig   ALPRAZolam (XANAX) 0.25 MG tablet Take 1 tablet (0.25 mg total) by mouth 2 (two) times daily as needed for anxiety.   buPROPion (WELLBUTRIN) 100 MG tablet Take 1 tablet (100 mg total) by mouth 2 (two) times daily.   Cranberry-Vitamin C (AZO CRANBERRY URINARY TRACT PO) Take by mouth.   cyanocobalamin (,VITAMIN B-12,) 1000 MCG/ML injection Inject 1 mL (1,000 mcg total) into the muscle every 14 (fourteen) days.   Garlic (GARLIQUE PO) Take by mouth every other day.   lisinopril (ZESTRIL) 10 MG tablet Take 1 tablet by mouth once daily   RESVERATROL PO Take 100 mg by mouth daily.    thyroid (ARMOUR) 90 MG tablet Take 90 mg by mouth daily.   TURMERIC CURCUMIN PO Take 2 tablets by mouth daily.    UNABLE TO FIND [place] take once daily   UNABLE TO FIND [place] take 4 tablets once daily   UNABLE TO FIND Comprehensive immune support take 2 tablets once daily   UNABLE TO FIND Modified citrus pectin take 4 tablets once daily   UNABLE TO FIND Celery seed take 2 tablets by mouth once daily   UNABLE TO FIND Maxivision multiple vitamin for eyes   [DISCONTINUED] Cyanocobalamin-Methylcobalamin 600-600 MCG SUBL Place under the tongue daily.   No facility-administered encounter medications on file as of 09/07/2020.    Review of Systems:  Review of Systems  Skin:  Negative for color change, pallor, rash and wound.       Right toe callus  Psychiatric/Behavioral:  Positive for dysphoric mood and sleep disturbance. Negative for agitation, behavioral problems, confusion, hallucinations, self-injury and suicidal ideas. The patient is not nervous/anxious and is not hyperactive.    Health Maintenance  Topic Date Due   Zoster Vaccines- Shingrix (1 of 2) Never done   TETANUS/TDAP   01/17/2013   COVID-19 Vaccine (4 - Booster for Moderna series) 03/04/2020   INFLUENZA VACCINE  08/17/2020   PNA vac Low Risk Adult  Completed   HPV VACCINES  Aged Out  Physical Exam: Vitals:   09/07/20 1508  BP: 126/84  Pulse: 67  Temp: (!) 97.4 F (36.3 C)  SpO2: 97%  Weight: 156 lb 9.6 oz (71 kg)  Height: '5\' 11"'$  (1.803 m)   Body mass index is 21.84 kg/m. Physical Exam Vitals and nursing note reviewed.  Constitutional:      Appearance: Normal appearance.  Cardiovascular:     Pulses:          Dorsalis pedis pulses are 2+ on the right side and 2+ on the left side.  Musculoskeletal:     Right lower leg: No edema.     Left lower leg: No edema.     Right foot: Normal range of motion. No deformity or bunion.     Left foot: Normal range of motion. No deformity or bunion.  Feet:     Right foot:     Skin integrity: Callus (between 4th and 5th toe) present. No ulcer, blister, skin breakdown, erythema, warmth or fissure.     Toenail Condition: Right toenails are normal.     Left foot:     Skin integrity: Skin integrity normal.     Toenail Condition: Left toenails are normal.  Neurological:     Mental Status: He is alert.    Labs reviewed: Basic Metabolic Panel: Recent Labs    09/12/19 1100 07/28/20 0000  NA 142 140  K 4.2 4.6  CL 103 102  CO2  --  28*  GLUCOSE 102*  --   BUN 21 28*  CREATININE 1.40* 1.5*  CALCIUM  --  9.4   Liver Function Tests: Recent Labs    07/28/20 0000  AST 12*  ALT 11  ALKPHOS 57  ALBUMIN 4.2   No results for input(s): LIPASE, AMYLASE in the last 8760 hours. No results for input(s): AMMONIA in the last 8760 hours. CBC: Recent Labs    09/12/19 1100 07/28/20 0000  WBC  --  5.0  HGB 14.3 12.8*  HCT 42.0 37*  PLT  --  203   Lipid Panel: Recent Labs    07/28/20 0000  CHOL 218*  HDL 65  LDLCALC 139  TRIG 73   No results found for: HGBA1C  Procedures since last visit: No results found.  Assessment/Plan  1.  Obstructive sleep apnea syndrome Using oral appliance recommending by dentistry Not interested in cpap  2. Primary insomnia  - traZODone (DESYREL) 50 MG tablet; Take 0.5-1 tablets (25-50 mg total) by mouth at bedtime as needed for sleep.  Dispense: 30 tablet; Refill: 1  3. Corn of toe Try corn pad in between two toes or toe separator to avoid friction If no improvement f/u with podiatry   Labs/tests ordered:  * No order type specified * Next appt:  11/30/2020

## 2020-09-07 NOTE — Patient Instructions (Signed)
Try trazodone 25 mg before bed, if no improvement may try 50 mg   Avoid screens before bed  Avoid alcohol before bed and caffeine   Keep a routine of going to bed at the same time each night

## 2020-09-08 DIAGNOSIS — M47816 Spondylosis without myelopathy or radiculopathy, lumbar region: Secondary | ICD-10-CM | POA: Diagnosis not present

## 2020-09-08 DIAGNOSIS — R143 Flatulence: Secondary | ICD-10-CM | POA: Diagnosis not present

## 2020-09-08 DIAGNOSIS — I77810 Thoracic aortic ectasia: Secondary | ICD-10-CM | POA: Diagnosis not present

## 2020-09-08 DIAGNOSIS — C499 Malignant neoplasm of connective and soft tissue, unspecified: Secondary | ICD-10-CM | POA: Diagnosis not present

## 2020-09-08 DIAGNOSIS — C4921 Malignant neoplasm of connective and soft tissue of right lower limb, including hip: Secondary | ICD-10-CM | POA: Diagnosis not present

## 2020-09-08 DIAGNOSIS — Z8546 Personal history of malignant neoplasm of prostate: Secondary | ICD-10-CM | POA: Diagnosis not present

## 2020-09-08 DIAGNOSIS — Z634 Disappearance and death of family member: Secondary | ICD-10-CM | POA: Diagnosis not present

## 2020-09-08 DIAGNOSIS — Z923 Personal history of irradiation: Secondary | ICD-10-CM | POA: Diagnosis not present

## 2020-09-08 DIAGNOSIS — Z85831 Personal history of malignant neoplasm of soft tissue: Secondary | ICD-10-CM | POA: Diagnosis not present

## 2020-09-08 DIAGNOSIS — R918 Other nonspecific abnormal finding of lung field: Secondary | ICD-10-CM | POA: Diagnosis not present

## 2020-09-08 DIAGNOSIS — Z85818 Personal history of malignant neoplasm of other sites of lip, oral cavity, and pharynx: Secondary | ICD-10-CM | POA: Diagnosis not present

## 2020-09-08 DIAGNOSIS — Z08 Encounter for follow-up examination after completed treatment for malignant neoplasm: Secondary | ICD-10-CM | POA: Diagnosis not present

## 2020-09-08 DIAGNOSIS — D480 Neoplasm of uncertain behavior of bone and articular cartilage: Secondary | ICD-10-CM | POA: Diagnosis not present

## 2020-09-08 DIAGNOSIS — Z79899 Other long term (current) drug therapy: Secondary | ICD-10-CM | POA: Diagnosis not present

## 2020-09-08 DIAGNOSIS — M25551 Pain in right hip: Secondary | ICD-10-CM | POA: Diagnosis not present

## 2020-09-17 ENCOUNTER — Telehealth: Payer: Self-pay | Admitting: Adult Health

## 2020-09-17 DIAGNOSIS — F5101 Primary insomnia: Secondary | ICD-10-CM

## 2020-09-17 DIAGNOSIS — G4733 Obstructive sleep apnea (adult) (pediatric): Secondary | ICD-10-CM

## 2020-09-17 NOTE — Telephone Encounter (Signed)
Nurse called to request referral to neurology for this resident due to insomnia/sleep apnea

## 2020-09-22 ENCOUNTER — Telehealth: Payer: Self-pay

## 2020-09-22 NOTE — Telephone Encounter (Signed)
Patient has not been seen in over 3 years.  I am ok with the provider switch.

## 2020-09-22 NOTE — Telephone Encounter (Signed)
We received a new referral for this patient of Dr Tori Milks, last seen in 2019. The patient is requesting a provider switch from Dr Rexene Alberts to Dr Brett Fairy for OSA and insomnia.  Can you please advise?

## 2020-09-23 NOTE — Telephone Encounter (Signed)
   Can we schedule for January 2023?   OK also.

## 2020-09-25 DIAGNOSIS — E039 Hypothyroidism, unspecified: Secondary | ICD-10-CM | POA: Diagnosis not present

## 2020-10-05 ENCOUNTER — Encounter (INDEPENDENT_AMBULATORY_CARE_PROVIDER_SITE_OTHER): Payer: Self-pay | Admitting: Ophthalmology

## 2020-10-05 ENCOUNTER — Other Ambulatory Visit: Payer: Self-pay

## 2020-10-05 ENCOUNTER — Non-Acute Institutional Stay: Payer: Medicare Other | Admitting: Adult Health

## 2020-10-05 ENCOUNTER — Ambulatory Visit (INDEPENDENT_AMBULATORY_CARE_PROVIDER_SITE_OTHER): Payer: Medicare Other | Admitting: Ophthalmology

## 2020-10-05 ENCOUNTER — Encounter: Payer: Self-pay | Admitting: Adult Health

## 2020-10-05 VITALS — BP 142/88 | HR 61 | Temp 97.0°F | Ht 71.0 in | Wt 157.6 lb

## 2020-10-05 DIAGNOSIS — R42 Dizziness and giddiness: Secondary | ICD-10-CM

## 2020-10-05 DIAGNOSIS — I10 Essential (primary) hypertension: Secondary | ICD-10-CM | POA: Diagnosis not present

## 2020-10-05 DIAGNOSIS — H43813 Vitreous degeneration, bilateral: Secondary | ICD-10-CM | POA: Diagnosis not present

## 2020-10-05 DIAGNOSIS — F3341 Major depressive disorder, recurrent, in partial remission: Secondary | ICD-10-CM | POA: Diagnosis not present

## 2020-10-05 DIAGNOSIS — F5101 Primary insomnia: Secondary | ICD-10-CM

## 2020-10-05 DIAGNOSIS — G4733 Obstructive sleep apnea (adult) (pediatric): Secondary | ICD-10-CM | POA: Diagnosis not present

## 2020-10-05 DIAGNOSIS — H353132 Nonexudative age-related macular degeneration, bilateral, intermediate dry stage: Secondary | ICD-10-CM | POA: Diagnosis not present

## 2020-10-05 MED ORDER — BUPROPION HCL 100 MG PO TABS: 100.0000 mg | ORAL_TABLET | Freq: Three times a day (TID) | ORAL | 1 refills | Status: DC
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
## 2020-10-05 NOTE — Assessment & Plan Note (Signed)
Patient has diagnosed central sleep apnea back in 2013.  Reports using CPAP only for 1 year with no benefit to his understanding and also has not used an oral device although it is being fitted in the near future once some other dental work is completed  Thus patient likely continues to have suffer ongoing macular hypoxic damage from his untreated sleep apnea

## 2020-10-05 NOTE — Assessment & Plan Note (Signed)

## 2020-10-05 NOTE — Patient Instructions (Signed)
The nature of age realated macular degeneration (ARMD)is explained as follows: The dry form refers to the progressive loss of normal blood supply to the central vision as a result of a combination of factors which include aging blood supply (arteriosclerosis, hardening of the arteries), genetics, smoking habits, and history of hypertension. Currently, no eye medications or vitamins slow this type of aging effect upon vision, however cessation of smoking and controlling hypertension help slow the disorder. The following analogy helps explain this: I describe the dry form of ARMD like a house of the same age as your eyes, which shows typical wear and tear of age upon the house structure and appearance. Like the aging house which can fall down structurally, the dry form of ARMD can deteriorate the structure of the macula (center) of the retina, most often gradually and affect central vision tasks such as reading and driving. The wet form of ARMD refers to the development of abnormally growing blood vessels usually near or under the central vision, with potential risk of permanent visual changes or vision losses. This complication of the Dry form of ARMD may be moderately reduced by use of AREDS 2 formula multivitamins daily. I describe the Wet form of ARMD as like the development of a fire in an aging house (DRY ARMD). It may develop suddenly, progress rapidly and be destructive based on where it starts and how big the fire is when found. In the eye, the house fire analogy refers to the abnormal blood vessels growing destructively near the central vison in the retina, or film of the eye. Halting the growth of blood vessels with laser (hot or cold) or injectable medications is best way "to put the fire out". Many patients will experience a stabilization or even improvement in vision with a treatment course, while others may still face a loss of vision. The use of injectable medications has revolutionized therapy and is  currently the only proven therapy to provide the chance of stable or improved acuity from new and recent destructive wet ARMD. 

## 2020-10-05 NOTE — Progress Notes (Signed)
10/05/2020     CHIEF COMPLAINT Patient presents for  Chief Complaint  Patient presents with   Eye Problem      HISTORY OF PRESENT ILLNESS: Philip [name] is a 87 y.o. male who presents to the clinic today for:   HPI   WIP- vision worsening OD. Pt last seen 3 months ago 06/25/2020.  Pt states "vision when driving I don't have any problem. I can read, my right eye vision up close is not as good. The left eye is the good eye that takes over when I read. When I am reading the words at the bottom of the t.v., they are moving but they seem like they are wavy. When I am looking at a still word- there is a grey area so I cannot see the whole word. That is new, I have not noticed that before, it started a couple weeks ago."  Pt states he was taking AREDS 2 Plus but changed to standard AREDS because the other ones were out of stock. When he made the change, shortly after he noticed the change in his vision. Last edited by Laurin Coder on 10/05/2020  9:28 AM.      Referring physician: Marygrace Drought, MD [place],   [id number]  HISTORICAL INFORMATION:   Selected notes from the MEDICAL RECORD NUMBER       CURRENT MEDICATIONS: No current outpatient medications on file. (Ophthalmic Drugs)   No current facility-administered medications for this visit. (Ophthalmic Drugs)   Current Outpatient Medications (Other)  Medication Sig   ALPRAZolam (XANAX) 0.25 MG tablet Take 1 tablet (0.25 mg total) by mouth 2 (two) times daily as needed for anxiety.   buPROPion (WELLBUTRIN) 100 MG tablet Take 1 tablet (100 mg total) by mouth 2 (two) times daily.   Cranberry-Vitamin C (AZO CRANBERRY URINARY TRACT PO) Take by mouth.   cyanocobalamin (,VITAMIN B-12,) 1000 MCG/ML injection Inject 1 mL (1,000 mcg total) into the muscle every 14 (fourteen) days.   Garlic (GARLIQUE PO) Take by mouth every other day.   lisinopril (ZESTRIL) 10 MG tablet Take 1 tablet by mouth once daily    RESVERATROL PO Take 100 mg by mouth daily.    thyroid (ARMOUR) 90 MG tablet Take 90 mg by mouth daily.   traZODone (DESYREL) 50 MG tablet Take 0.5-1 tablets (25-50 mg total) by mouth at bedtime as needed for sleep.   TURMERIC CURCUMIN PO Take 2 tablets by mouth daily.    UNABLE TO FIND [place] Bone take once daily   UNABLE TO FIND [place] take 4 tablets once daily   UNABLE TO FIND Comprehensive immune support take 2 tablets once daily   UNABLE TO FIND Modified citrus pectin take 4 tablets once daily   UNABLE TO FIND Celery seed take 2 tablets by mouth once daily   UNABLE TO FIND Maxivision multiple vitamin for eyes   No current facility-administered medications for this visit. (Other)      REVIEW OF SYSTEMS:    ALLERGIES Allergies  Allergen Reactions   Cefixime Rash   Contrast Media [Iodinated Diagnostic Agents] Other (See Comments)    Avoid due to renal disease   Macrobid [Nitrofurantoin Macrocrystal] Swelling   Nsaids Other (See Comments)    Has been told no NSAIDs, Ibuprofen Renal insufficiency hx.    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)    nephrologist-- dr Johnette Abraham. [name]   Complication of anesthesia    post op acute urinary retention   DDD (degenerative disc disease)    Fatigue    pt evalulation by cardiology--- dr Fransico Michael, note 05-21-2019 in epic,(non cardiac)  normal echo and event monitor showed extra beats of atrial tachycardia , no atrial fib, and rare ecotpy   Full dentures    GERD (gastroesophageal reflux disease)    History of basal cell carcinoma (BCC) excision    12-25-2018 MOH's reconstruction left lower eyelid   History  of external beam radiation therapy 2002   prostate cancer  and boost with radioative prostate seed implants   History of prostate cancer DX  2002   S/P EXTERNAL RADIATION/ RADIOACTIVE SEED IMPLANTS  2003   History of squamous cell carcinoma excision    2012--  RIGHT LOWER EXTREM;  2019 LEFT LOWER EXTREMITIY   HTN (hypertension)    followed by pcp   Hyperlipidemia    Hypothyroidism    followed by pcp--- acquired atrophy due to radiation;  previously seen by endocrinologist-  dr Chalmers Cater--- per pt takes his own thyroid med. 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  (09-06-2019  per pt no longer prednisone last taken one month ago 07/ 2021, weaned off by pcp per pt request)   Left renal artery stenosis [facility])    nephrologist--- dr [name]--- with aneurysm proximal    Lymphedema of left leg    followed by [facility]---- pt stated uses compression hose   OSA (obstructive sleep apnea) 09-06-2019  per pt lasted used cpap approx. 2019, stated felt he no longer needed    pt retested 11-13-2016 at [facility] w/ dr ather-- moderate to severe OSA , AHI 16.4/h/  03-24-2017    Pulmonary nodule followed by [facility]   per CT 06-11-2019  in [place] in epic done at [place],  LUL nodule , not mets   Renal cyst, right    Restless legs syndrome (RLS)    S/P radiation therapy     for tonsillar cancer (head and neck) completed 01-26-2012 at [facility]);   and XRT for right lower leg sarcoma --- completed 06-06-2017   Saliva decreased    Sarcoma of lower extremity, right ([place]) oncolgoist-  dr Juluis Rainier ([facility]) & Dr [name] ([facility])   dx 02-14-2017 w/ needle core bx;  high grade pleomorphic spindle cell sarcoma , grade 2 (cT2N0M0); 03-15-2017  radical resection sarcoma tumor right lower leg  and completed radiation 06-06-2017    Self-catheterizes urinary bladder    QID   AND   PRN   Tonsillar cancer ([place]) 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+) (Stage IVA)---- dx oct 2013  ----s/p left tonsillectomy and concurent chemo and radiation/  ended 01-26-2012-- no surgical intervention---  residuals ( dry mouth, decreased saliva)   oncologist at [place]--  dr brizel--  HX OF -- NO RECURRENCE   Urethral false passage 07/2019   Urethral stricture urologist--- dr Jeffie Pollock  chronic---  post urethral dilation's   Urinary retention with incomplete bladder emptying    Vitamin B 12 deficiency 01/28/2011   Vitamin D deficiency    Past Surgical History:  Procedure Laterality Date   BALLOON DILATION N/A 11/10/2014   Procedure: CYSTO BALLOON DILATION AND RETROGRADE URETHROGRAM ;  Surgeon: Bjorn Loser, MD;  Location: [facility];  Service: Urology;  Laterality: N/A;   CATARACT EXTRACTION W/ INTRAOCULAR LENS  IMPLANT, BILATERAL  2012  approx   CYSTO/ BALLOON DILATION OF  URETHRAL STRICTURE  12-25-2010   CYSTOSCOPY WITH RETROGRADE URETHROGRAM N/A 03/30/2016   Procedure: CYSTOSCOPY WITH RETROGRADE URETHROGRAM AND BALLOON DILATION with cystogram;  Surgeon: Bjorn Loser, MD;  Location: [facility];  Service: Urology;  Laterality: N/A;   CYSTOSCOPY WITH URETHRAL DILATATION  05/31/2011   Procedure: CYSTOSCOPY WITH URETHRAL DILATATION;  Surgeon: Reece Packer, MD;  Location: [place];  Service: Urology;  Laterality: N/A;  BALLOON DILATION   CYSTOSCOPY WITH URETHRAL DILATATION N/A 09/13/2016   Procedure: CYSTOSCOPY WITH URETHRAL BALLOON DILATATION;  Surgeon: Bjorn Loser, MD;  Location: [place];  Service: Urology;  Laterality: N/A;   CYSTOSCOPY WITH URETHRAL DILATATION N/A 03/30/2017   Procedure: CYSTOSCOPY WITH BALLOON URETHRAL DILATATION;  Surgeon: Bjorn Loser, MD;  Location: [place];  Service: Urology;   Laterality: N/A;   CYSTOSCOPY WITH URETHRAL DILATATION N/A 09/12/2019   Procedure: CYSTOSCOPY WITH URETHRAL DILATATION;  Surgeon: Irine Seal, MD;  Location: [facility];  Service: Urology;  Laterality: N/A;   CYSTOSCOPY/RETROGRADE/URETEROSCOPY N/A 05/22/2012   Procedure: CYSTOSCOPY BALLOON DILATION RETROGRADE URETEROGRAM ;  Surgeon: Reece Packer, MD;  Location: [place];  Service: Urology;  Laterality: N/A;   CYTSO/  DILATATION URETHRAL STRICTURE/  BX PROSTATIC URETHRA/  REMOVAL FOREIGN BODIES  06-25-2010   [facility]   ECTROPION REPAIR Left 06/18/2019   lower eyelid   INGUINAL HERNIA REPAIR Right 2000   MOHS SURGERY  01/ 2019    [facility]   left lower leg for [facility]   MOHS SURGERY  12/25/2018   for [facility] of left lower eyelid;  the second stage Hughs flap release 01-09-2019   RADICAL RESECTION OF SARCOMA TUMOR   03-15-2017   [facility]   RIGHT LOWER LEG , CALF AREA   RADIOACTIVE SEED IMPLANTS, PROSTATE  JAN  2003   SKIN LESION EXCISION  07/2012   MOST  right shoulder   TONSILLECTOMY Left 11-10-2011  '@[facility]'$    tonsillar cancer   TRANSURETHRAL INCISION OF BLADDER NECK N/A 09/12/2019   Procedure: TRANSURETHRAL INCISION OF BLADDER NECK;  Surgeon: Irine Seal, MD;  Location: [place];  Service: Urology;  Laterality: N/A;    FAMILY HISTORY Family History  Problem Relation Age of Onset   Stroke Father     SOCIAL HISTORY Social History   Tobacco Use   Smoking status: Never   Smokeless tobacco: Never  Vaping Use   Vaping Use: Never used  Substance Use Topics   Alcohol use: Yes    Alcohol/week: 7.0 standard drinks    Types: 7 Glasses of wine per week    Comment: 8oz, glass or red wine   Drug use: No         OPHTHALMIC EXAM:  Base Eye Exam     Visual Acuity (ETDRS)       Right Left   Dist cc 20/400 20/50 -2   Dist ph cc NI 20/30 -1  Tonometry (Tonopen, 9:32 AM)       Right Left   Pressure 8 5         Pupils       Pupils  Dark Light React APD   Right PERRL 4 3.5 Minimal None   Left PERRL 4 3.5 Minimal None         Visual Fields (Counting fingers)       Left Right    Full Full         Extraocular Movement       Right Left    Full Full         Neuro/Psych     Oriented x3: Yes   Mood/Affect: Normal         Dilation     Both eyes: 1.0% Mydriacyl, 2.5% Phenylephrine @ 9:32 AM           Slit Lamp and Fundus Exam     External Exam       Right Left   External Normal Normal         Slit Lamp Exam       Right Left   Lids/Lashes Normal Normal   Conjunctiva/Sclera White and quiet White and quiet   Cornea Clear Clear   Anterior Chamber Deep and quiet Deep and quiet   Iris Round and reactive Round and reactive   Lens Posterior chamber intraocular lens Posterior chamber intraocular lens   Anterior Vitreous Normal Normal         Fundus Exam       Right Left   Posterior Vitreous Posterior vitreous detachment Posterior vitreous detachment   Disc Normal Normal   C/D Ratio 0.3 0.45   Macula Intermediate age related macular degeneration, Retinal pigment epithelial atrophy, Soft drusen, no exudates, no hemorrhage, Retinal pigment epithelial mottling Intermediate age related macular degeneration, Retinal pigment epithelial atrophy, Soft drusen, no exudates, no hemorrhage, Retinal pigment epithelial mottling, no macular thickening, pigmented atrophy   Vessels Normal Normal   Periphery Normal Normal            IMAGING AND PROCEDURES  Imaging and Procedures for 10/05/20  OCT, Retina - OU - Both Eyes       Right Eye Quality was good. Scan locations included subfoveal. Central Foveal Thickness: 239. Progression has been stable. Findings include retinal drusen , abnormal foveal contour.   Left Eye Quality was good. Scan locations included subfoveal. Central Foveal Thickness: 261. Progression has been stable. Findings include retinal drusen , abnormal foveal contour.    Notes No signs of CNVM OU, no change, with outer retinal atrophy documented with loss of the outer photoreceptor layer as well as structural retina in the foveal region accounting for the significant decline in vision in the right eye from 2100 to now 2400.  There is no sign of wet AMD thus there is no sign of treatable lesion at this time in either eye     Color Fundus Photography Optos - OU - Both Eyes       Right Eye Progression has been stable. Disc findings include normal observations. Macula : geographic atrophy, drusen. Vessels : normal observations. Periphery : normal observations.   Left Eye Progression has been stable. Disc findings include normal observations. Macula : drusen, geographic atrophy. Vessels : normal observations. Periphery : normal observations.   Notes Subfoveal geographic atrophy document on color fundus photography as well as on clinical examination.  No signs of wet AMD OU.  OS with incidental  note of small choroidal nevus in the posterior pole no impact on acuity  Incidental posterior vitreous detachment also noted on color fundus photography             ASSESSMENT/PLAN:  OSA (obstructive sleep apnea) Patient has diagnosed central sleep apnea back in 2013.  Reports using CPAP only for 1 year with no benefit to his understanding and also has not used an oral device although it is being fitted in the near future once some other dental work is completed  Thus patient likely continues to have suffer ongoing macular hypoxic damage from his untreated sleep apnea  Intermediate stage nonexudative age-related macular degeneration of both eyes Progressive outer retinal atrophy by clinical examination with geographic RPE atrophy changes as well as documented by OCT today, with outer photoreceptor layer atrophy notable and reviewed and displayed to the patient.  I showed this is ongoing in each eye.  Notably over the last 15 months large subfoveal drusenoid  deposits have diminished and now is left with geographic atrophy in the same area   The nature of dry age related macular degeneration was discussed with the patient as well as its possible conversion to wet. The results of the AREDS 2 study was discussed with the patient. A diet rich in dark leafy green vegetables was advised and specific recommendations were made regarding supplements with AREDS 2 formulation . Control of hypertension and serum cholesterol may slow the disease. Smoking cessation is mandatory to slow the disease and diminish the risk of progressing to wet age related macular degeneration. The patient was instructed in the use of an [place] and was told to return immediately for any changes in the Grid. Stressed to the patient do not rub eyes  Posterior vitreous detachment of both eyes  The nature of posterior vitreous detachment was discussed with the patient as well as its physiology, its age prevalence, and its possible implication regarding retinal breaks and detachment.  An informational brochure was offered to the patient.  All the patient's questions were answered.  The patient was asked to return if new or different flashes or floaters develops.   Patient was instructed to contact office immediately if any new changes were noticed. I explained to the patient that vitreous inside the eye is similar to jello inside a bowl. As the jello melts it can start to pull away from the bowl, similarly the vitreous throughout our lives can begin to pull away from the retina. That process is called a posterior vitreous detachment. In some cases, the vitreous can tug hard enough on the retina to form a retinal tear. I discussed with the patient the signs and symptoms of a retinal detachment.  Do not rub the eye.      ICD-10-CM   1. Intermediate stage nonexudative age-related macular degeneration of both eyes  H35.3132 OCT, Retina - OU - Both Eyes    Color Fundus Photography Optos - OU -  Both Eyes    2. OSA (obstructive sleep apnea)  G47.33     3. Posterior vitreous detachment of both eyes  H43.813       1.  Visual acuity decline gradual OD on the basis of outer photoreceptor outer retinal atrophy.  Notably no signs of CNVM thus no treatable lesions in either eye.  2.  I have discussed with the patient and reviewed with him the displays of the OCT findings of large accumulative subfoveal drusenoid material from June 2021.  These have now  completely resolved and atrophic areas solely on the basis of normal aging processes leading to poor blood supply (perfusion) to the outer retina which cannot be remedied nor restored.  Moreover the effect is on the central visual acuity of small details however all peripheral vision remains intact in each eye.  The left eye functions nicely at 20/50 pinhole with 20/30.  Notably the left eye also has similar atrophy and may progress in the future  3.  I have also reviewed his growth chart noting that he has a 9-year history of diagnosed obstructive sleep apnea which was at that time record is mostly central.  He was only able to tolerate CPAP therapy for 1 year.  This means for the last 8 to 9 years he has not been treating the obvious hypoxic damage (low oxygen) which occurs with untreated sleep apnea of any sort.  He reports that he is waiting a dental device currently for other dental work to be completed so that he may have some better effects that treatment of the sleep apnea.  It is my concern and experience opinion that nightly hypoxic damage that is inappropriate oxygenation in.,  Untreated at night can lead to progression and significant progression of macular degeneration and other eye diseases because the requirements for oxygen in the macular are the highest in the body.  Ophthalmic Meds Ordered this visit:  No orders of the defined types were placed in this encounter.      Return in about 6 months (around 04/04/2021) for DILATE  OU, COLOR FP, OCT.  There are no Patient Instructions on file for this visit.   Explained the diagnoses, plan, and follow up with the patient and they expressed understanding.  Patient expressed understanding of the importance of proper follow up care.   Clent Demark Raniya Golembeski M.D. Diseases & Surgery of the Retina and Vitreous Retina & Diabetic [place] 10/05/20     Abbreviations: M myopia (nearsighted); A astigmatism; H hyperopia (farsighted); P presbyopia; Mrx spectacle prescription;  CTL contact lenses; OD right eye; OS left eye; OU both eyes  XT exotropia; ET esotropia; PEK punctate epithelial keratitis; PEE punctate epithelial erosions; DES dry eye syndrome; MGD meibomian gland dysfunction; ATs artificial tears; PFAT's preservative free artificial tears; [place] nuclear sclerotic cataract; PSC posterior subcapsular cataract; ERM epi-retinal membrane; PVD posterior vitreous detachment; RD retinal detachment; DM diabetes mellitus; DR diabetic retinopathy; NPDR non-proliferative diabetic retinopathy; PDR proliferative diabetic retinopathy; CSME clinically significant macular edema; DME diabetic macular edema; dbh dot blot hemorrhages; CWS cotton wool spot; POAG primary open angle glaucoma; C/D cup-to-disc ratio; HVF humphrey visual field; GVF goldmann visual field; OCT optical coherence tomography; IOP intraocular pressure; BRVO Branch retinal vein occlusion; CRVO central retinal vein occlusion; CRAO central retinal artery occlusion; BRAO branch retinal artery occlusion; RT retinal tear; SB scleral buckle; PPV pars plana vitrectomy; VH Vitreous hemorrhage; PRP panretinal laser photocoagulation; IVK intravitreal kenalog; VMT vitreomacular traction; MH Macular hole;  NVD neovascularization of the disc; NVE neovascularization elsewhere; AREDS age related eye disease study; ARMD age related macular degeneration; POAG primary open angle glaucoma; EBMD epithelial/anterior basement membrane dystrophy; ACIOL  anterior chamber intraocular lens; IOL intraocular lens; PCIOL posterior chamber intraocular lens; Phaco/IOL phacoemulsification with intraocular lens placement; [place] photorefractive keratectomy; LASIK laser assisted in situ keratomileusis; HTN hypertension; DM diabetes mellitus; COPD chronic obstructive pulmonary disease

## 2020-10-05 NOTE — Assessment & Plan Note (Addendum)
Progressive outer retinal atrophy by clinical examination with geographic RPE atrophy changes as well as documented by OCT today, with outer photoreceptor layer atrophy notable and reviewed and displayed to the patient.  I showed this is ongoing in each eye.  Notably over the last 15 months large subfoveal drusenoid deposits have diminished and now is left with geographic atrophy in the same area   The nature of dry age related macular degeneration was discussed with the patient as well as its possible conversion to wet. The results of the AREDS 2 study was discussed with the patient. A diet rich in dark leafy green vegetables was advised and specific recommendations were made regarding supplements with AREDS 2 formulation . Control of hypertension and serum cholesterol may slow the disease. Smoking cessation is mandatory to slow the disease and diminish the risk of progressing to wet age related macular degeneration. The patient was instructed in the use of an [place] and was told to return immediately for any changes in the Grid. Stressed to the patient do not rub eyes

## 2020-10-06 DIAGNOSIS — E039 Hypothyroidism, unspecified: Secondary | ICD-10-CM | POA: Diagnosis not present

## 2020-10-06 DIAGNOSIS — R42 Dizziness and giddiness: Secondary | ICD-10-CM | POA: Diagnosis not present

## 2020-10-06 LAB — CBC AND DIFFERENTIAL
HCT: 39 — AB (ref 41–53)
Hemoglobin: 13.4 — AB (ref 13.5–17.5)
Platelets: 219 (ref 150–399)
WBC: 4.8

## 2020-10-06 LAB — COMPREHENSIVE METABOLIC PANEL
Albumin: 4.2 (ref 3.5–5.0)
Calcium: 9.4 (ref 8.7–10.7)
Globulin: 2.6

## 2020-10-06 LAB — BASIC METABOLIC PANEL
BUN: 34 — AB (ref 4–21)
CO2: 27 — AB (ref 13–22)
Chloride: 101 (ref 99–108)
Creatinine: 1.4 — AB (ref 0.6–1.3)
Glucose: 93
Potassium: 4.6 (ref 3.4–5.3)
Sodium: 139 (ref 137–147)

## 2020-10-06 LAB — HEPATIC FUNCTION PANEL
ALT: 4 — AB (ref 10–40)
AST: 10 — AB (ref 14–40)
Alkaline Phosphatase: 56 (ref 25–125)
Bilirubin, Total: 0.4

## 2020-10-06 LAB — CBC: RBC: 4.11 (ref 3.87–5.11)

## 2020-10-15 ENCOUNTER — Other Ambulatory Visit: Payer: Self-pay | Admitting: Adult Health

## 2020-10-15 DIAGNOSIS — E538 Deficiency of other specified B group vitamins: Secondary | ICD-10-CM

## 2020-10-15 MED ORDER — CYANOCOBALAMIN 1000 MCG/ML IJ SOLN: 1000.0000 ug | INTRAMUSCULAR | 3 refills | Status: DC

## 2020-10-21 ENCOUNTER — Encounter: Payer: Self-pay | Admitting: Internal Medicine

## 2020-10-21 ENCOUNTER — Non-Acute Institutional Stay: Payer: Medicare Other | Admitting: Internal Medicine

## 2020-10-21 ENCOUNTER — Other Ambulatory Visit: Payer: Self-pay

## 2020-10-21 VITALS — BP 174/108 | HR 57 | Temp 96.8°F | Ht 71.0 in | Wt 159.0 lb

## 2020-10-21 DIAGNOSIS — F4310 Post-traumatic stress disorder, unspecified: Secondary | ICD-10-CM

## 2020-10-21 DIAGNOSIS — E538 Deficiency of other specified B group vitamins: Secondary | ICD-10-CM | POA: Diagnosis not present

## 2020-10-21 DIAGNOSIS — Z85831 Personal history of malignant neoplasm of soft tissue: Secondary | ICD-10-CM | POA: Diagnosis not present

## 2020-10-21 DIAGNOSIS — I10 Essential (primary) hypertension: Secondary | ICD-10-CM

## 2020-10-21 DIAGNOSIS — C099 Malignant neoplasm of tonsil, unspecified: Secondary | ICD-10-CM

## 2020-10-21 DIAGNOSIS — F5101 Primary insomnia: Secondary | ICD-10-CM

## 2020-10-21 DIAGNOSIS — F3341 Major depressive disorder, recurrent, in partial remission: Secondary | ICD-10-CM

## 2020-10-21 DIAGNOSIS — F4321 Adjustment disorder with depressed mood: Secondary | ICD-10-CM

## 2020-10-21 DIAGNOSIS — N1832 Chronic kidney disease, stage 3b: Secondary | ICD-10-CM | POA: Diagnosis not present

## 2020-10-21 MED ORDER — MIRTAZAPINE 7.5 MG PO TABS: 7.5000 mg | ORAL_TABLET | Freq: Every day | ORAL | 0 refills | Status: DC

## 2020-10-23 NOTE — Progress Notes (Signed)
Location: [place] of Service:  Clinic (12)  Provider:   Code Status:  Goals of Care:  Advanced Directives 10/21/2020  Does Patient Have a Medical Advance Directive? Yes  Type of Paramedic of [place];Living will;Out of facility DNR (pink MOST or yellow form)  Does patient want to make changes to medical advance directive? No - Patient declined  Copy of [place] in Chart? Yes - validated most recent copy scanned in chart (See row information)  Would patient like information on creating a medical advance directive? -  Pre-existing out of facility DNR order (yellow form or pink MOST form) -     Chief Complaint  Patient presents with   Medical Management of Chronic Issues    Patient returns to the clinic for follow up.    Quality Metric Gaps    Shingrix, TDAP, #4 covid, and flu shot    HPI: Patient is a 87 y.o. male seen today for an acute visit for Acute Depression   Has Past medical  history of sarcoma of Right lower extremity Follows closely with oncology at [facility] for [facility] scan of his chest for lung nodules. Has been in remission History of prostate cancer. History of tonsillar cancer Macular degeneration follows with ophthalmologist Aortitis syndrome.  Follows with Dr. Dossie Der who sometimes has to use prednisone as needed B12 deficiency takes injection self administer Hypothyroid follows with Dr. Chalmers Cater History of OSA does not use CPAP as he thinks it does not help. Left Leg Lymphedema     Acute Issues  Acute Depression and anxiety Recent Loss of his wife in SNF 2 months ago Came with his daughter today Struggling with depression and Insomnia Was started on Trazodone. Did not help not taking anymore Very tearful. 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 Cherre Blanc on 03-15-2017 Has been having pain in that leg recently underwent imaging with [facility] and everything has been negative so far  H/o Tonsillar Cancer Feels Food getting Stuck in his throat Is getting detail work up in [place]  Hypertension He has been not taking his Lisinopril properly as he thinks he is not suppose to take it unless his BP is elevated He missed his dose yesterday   Past Medical History:  Diagnosis Date   Age-related macular degeneration, dry, both eyes    Aortitis syndrome ([place]) rheumotologist-  dr syed   IgG4 syndrome--  (effects abdomine) ,  previously long term use prednisone last taken 07/ 2021   Basal cell carcinoma (BCC) of eye    CKD (chronic kidney disease), stage III [facility])    nephrologist-- dr Johnette Abraham. [name]   Complication of anesthesia    post op acute urinary retention   DDD (degenerative disc disease)    Fatigue    pt evalulation by cardiology--- dr Fransico Michael, note 05-21-2019 in epic,(non cardiac)  normal echo and event monitor showed extra beats of atrial tachycardia , no atrial fib, and rare ecotpy   Full dentures    GERD (gastroesophageal reflux disease)    History of basal cell carcinoma (BCC) excision    12-25-2018 MOH's reconstruction  left lower eyelid   History of external beam radiation therapy 2002   prostate cancer  and boost with radioative prostate seed implants   History of prostate cancer DX  2002   S/P EXTERNAL RADIATION/ RADIOACTIVE SEED IMPLANTS  2003   History of squamous cell carcinoma excision    2012--  RIGHT LOWER EXTREM;  2019 LEFT LOWER EXTREMITIY   HTN (hypertension)    followed by pcp   Hyperlipidemia    Hypothyroidism    followed by pcp--- acquired atrophy due to radiation;  previously seen by endocrinologist-  dr Chalmers Cater--- per pt takes his  own thyroid med. called NP Thyroid 90 mg daily, does not take synthroid   Large vessel vasculitis ([place])    rheumotologist-  dr syed --- IgG4 related aoritis involving renal arteries ,  pt treated with predisone.  (09-06-2019  per pt no longer prednisone last taken one month ago 07/ 2021, weaned off by pcp per pt request)   Left renal artery stenosis [facility])    nephrologist--- dr [name]--- with aneurysm proximal    Lymphedema of left leg    followed by [facility]---- pt stated uses compression hose   OSA (obstructive sleep apnea) 09-06-2019  per pt lasted used cpap approx. 2019, stated felt he no longer needed    pt retested 11-13-2016 at [facility] w/ dr ather-- moderate to severe OSA , AHI 16.4/h/  03-24-2017    Pulmonary nodule followed by [facility]   per CT 06-11-2019  in [place] in epic done at [place],  LUL nodule , not mets   Renal cyst, right    Restless legs syndrome (RLS)    S/P radiation therapy     for tonsillar cancer (head and neck) completed 01-26-2012 at [facility]);   and XRT for right lower leg sarcoma --- completed 06-06-2017   Saliva decreased    Sarcoma of lower extremity, right ([place]) oncolgoist-  dr Juluis Rainier ([facility]) & Dr [name] ([facility])   dx 02-14-2017 w/ needle core bx;  high grade pleomorphic spindle cell sarcoma , grade 2 (cT2N0M0); 03-15-2017  radical resection sarcoma tumor right lower leg  and completed radiation 06-06-2017   Self-catheterizes urinary bladder    QID   AND   PRN   Tonsillar cancer ([place]) 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+) (Stage IVA)---- dx oct 2013  ----s/p left tonsillectomy and concurent chemo and radiation/  ended 01-26-2012-- no surgical intervention---  residuals ( dry mouth, decreased saliva)   oncologist at [place]--  dr brizel--  HX OF -- NO RECURRENCE   Urethral false passage 07/2019   Urethral stricture urologist--- dr Jeffie Pollock   chronic---  post urethral dilation's    Urinary retention with incomplete bladder emptying    Vitamin B 12 deficiency 01/28/2011   Vitamin D deficiency     Past Surgical History:  Procedure Laterality Date   BALLOON DILATION N/A 11/10/2014   Procedure: CYSTO BALLOON DILATION AND RETROGRADE URETHROGRAM ;  Surgeon: Bjorn Loser, MD;  Location: [facility];  Service: Urology;  Laterality: N/A;   CATARACT EXTRACTION W/ INTRAOCULAR LENS  IMPLANT, BILATERAL  2012  approx   CYSTO/ BALLOON DILATION OF  URETHRAL STRICTURE  12-25-2010   CYSTOSCOPY WITH RETROGRADE URETHROGRAM N/A 03/30/2016   Procedure: CYSTOSCOPY WITH RETROGRADE URETHROGRAM AND BALLOON DILATION with cystogram;  Surgeon: Bjorn Loser, MD;  Location: [facility];  Service: Urology;  Laterality: N/A;  CYSTOSCOPY WITH URETHRAL DILATATION  05/31/2011   Procedure: CYSTOSCOPY WITH URETHRAL DILATATION;  Surgeon: Reece Packer, MD;  Location: [place];  Service: Urology;  Laterality: N/A;  BALLOON DILATION   CYSTOSCOPY WITH URETHRAL DILATATION N/A 09/13/2016   Procedure: CYSTOSCOPY WITH URETHRAL BALLOON DILATATION;  Surgeon: Bjorn Loser, MD;  Location: [place];  Service: Urology;  Laterality: N/A;   CYSTOSCOPY WITH URETHRAL DILATATION N/A 03/30/2017   Procedure: CYSTOSCOPY WITH BALLOON URETHRAL DILATATION;  Surgeon: Bjorn Loser, MD;  Location: [place];  Service: Urology;  Laterality: N/A;   CYSTOSCOPY WITH URETHRAL DILATATION N/A 09/12/2019   Procedure: CYSTOSCOPY WITH URETHRAL DILATATION;  Surgeon: Irine Seal, MD;  Location: [facility];  Service: Urology;  Laterality: N/A;   CYSTOSCOPY/RETROGRADE/URETEROSCOPY N/A 05/22/2012   Procedure: CYSTOSCOPY BALLOON DILATION RETROGRADE URETEROGRAM ;  Surgeon: Reece Packer, MD;  Location: [place];  Service: Urology;  Laterality: N/A;   CYTSO/  DILATATION URETHRAL STRICTURE/  BX PROSTATIC URETHRA/   REMOVAL FOREIGN BODIES  06-25-2010   [facility]   ECTROPION REPAIR Left 06/18/2019   lower eyelid   INGUINAL HERNIA REPAIR Right 2000   MOHS SURGERY  01/ 2019    [facility]   left lower leg for [facility]   MOHS SURGERY  12/25/2018   for [facility] of left lower eyelid;  the second stage Hughs flap release 01-09-2019   RADICAL RESECTION OF SARCOMA TUMOR   03-15-2017   [facility]   RIGHT LOWER LEG , CALF AREA   RADIOACTIVE SEED IMPLANTS, PROSTATE  JAN  2003   SKIN LESION EXCISION  07/2012   MOST  right shoulder   TONSILLECTOMY Left 11-10-2011  @[facility]    tonsillar cancer   TRANSURETHRAL INCISION OF BLADDER NECK N/A 09/12/2019   Procedure: TRANSURETHRAL INCISION OF BLADDER NECK;  Surgeon: Irine Seal, MD;  Location: [place];  Service: Urology;  Laterality: N/A;    Allergies  Allergen Reactions   Cefixime Rash   Contrast Media [Iodinated Diagnostic Agents] Other (See Comments)    Avoid due to renal disease   Macrobid [Nitrofurantoin Macrocrystal] Swelling   Nsaids Other (See Comments)    Has been told no NSAIDs, Ibuprofen Renal insufficiency hx.    Cephalosporins Rash    Outpatient Encounter Medications as of 10/21/2020  Medication Sig   ALPRAZolam (XANAX) 0.25 MG tablet Take 1 tablet (0.25 mg total) by mouth 2 (two) times daily as needed for anxiety.   buPROPion (WELLBUTRIN) 100 MG tablet Take 1 tablet (100 mg total) by mouth 3 (three) times daily. (Patient taking differently: Take 100 mg by mouth 2 (two) times daily.)   Cholecalciferol (VITAMIN D) 50 MCG (2000 UT) CAPS Take by mouth daily.   Cranberry-Vitamin C (AZO CRANBERRY URINARY TRACT PO) Take by mouth.   cyanocobalamin (,VITAMIN B-12,) 1000 MCG/ML injection Inject 1 mL (1,000 mcg total) into the muscle every 14 (fourteen) days.   Garlic (GARLIQUE PO) Take by mouth every other day.   lisinopril (ZESTRIL) 10 MG tablet Take 1 tablet by mouth once daily   mirtazapine (REMERON) 7.5 MG tablet Take 1 tablet (7.5 mg total) by mouth at bedtime.  Start with 3/week and then go to everyday If any issue with grogginess stop and let us know   RESVERATROL PO Take 100 mg by mouth daily.    thyroid (ARMOUR) 90 MG tablet Take 90 mg by mouth daily.   TURMERIC CURCUMIN PO Take 2 tablets by mouth daily.  UNABLE TO FIND [place] take once daily   UNABLE TO FIND [place] take 4 tablets once daily   UNABLE TO FIND Comprehensive immune support take 2 tablets once daily   UNABLE TO FIND Modified citrus pectin take 4 tablets once daily   UNABLE TO FIND Celery seed take 2 tablets by mouth once daily   UNABLE TO FIND Maxivision multiple vitamin for eyes   No facility-administered encounter medications on file as of 10/21/2020.    Review of Systems:  Review of Systems  Constitutional:  Positive for activity change.  HENT: Negative.    Respiratory: Negative.    Cardiovascular: Negative.   Gastrointestinal: Negative.   Genitourinary: Negative.   Musculoskeletal: Negative.   Skin: Negative.   Neurological: Negative.   Psychiatric/Behavioral:  Positive for dysphoric mood and sleep disturbance. The patient is nervous/anxious.    Health Maintenance  Topic Date Due   Zoster Vaccines- Shingrix (1 of 2) Never done   TETANUS/TDAP  01/17/2013   COVID-19 Vaccine (4 - Booster for Moderna series) 02/25/2020   INFLUENZA VACCINE  08/17/2020   HPV VACCINES  Aged Out    Physical Exam: Vitals:   10/21/20 0954  BP: (!) 174/108  Pulse: (!) 57  Temp: (!) 96.8 F (36 C)  SpO2: 98%  Weight: 159 lb (72.1 kg)  Height: 5\' 11"  (1.803 m)   Body mass index is 22.18 kg/m. Physical Exam Vitals reviewed.  Constitutional:      Appearance: Normal appearance.  HENT:     Head: Normocephalic.     Nose: Nose normal.     Mouth/Throat:     Mouth: Mucous membranes are moist.     Pharynx: Oropharynx is clear.  Eyes:     Pupils: Pupils are equal, round, and reactive to light.  Cardiovascular:     Rate and Rhythm: Normal rate and regular rhythm.      Pulses: Normal pulses.     Heart sounds: No murmur heard. Pulmonary:     Effort: Pulmonary effort is normal.     Breath sounds: Normal breath sounds.  Abdominal:     General: Abdomen is flat. Bowel sounds are normal.     Palpations: Abdomen is soft.  Musculoskeletal:        General: Swelling present.     Cervical back: Neck supple.  Skin:    General: Skin is warm and dry.  Neurological:     General: No focal deficit present.     Mental Status: He is alert and oriented to person, place, and time.  Psychiatric:     Comments: Very Anxious Depressed and Tearful    Labs reviewed: Basic Metabolic Panel: Recent Labs    07/28/20 0000 08/13/20 0000 10/06/20 0000  NA 140  --  139  K 4.6  --  4.6  CL 102  --  101  CO2 28*  --  27*  BUN 28*  --  34*  CREATININE 1.5*  --  1.4*  CALCIUM 9.4  --  9.4  TSH  --  2.03  --    Liver Function Tests: Recent Labs    07/28/20 0000 10/06/20 0000  AST 12* 10*  ALT 11 4*  ALKPHOS 57 56  ALBUMIN 4.2 4.2   No results for input(s): LIPASE, AMYLASE in the last 8760 hours. No results for input(s): AMMONIA in the last 8760 hours. CBC: Recent Labs    07/28/20 0000 10/06/20 0000  WBC 5.0 4.8  HGB 12.8* 13.4*  HCT  37* 39*  PLT 203 219   Lipid Panel: Recent Labs    07/28/20 0000  CHOL 218*  HDL 65  LDLCALC 139  TRIG 73   No results found for: HGBA1C  Procedures since last visit: OCT, Retina - OU - Both Eyes  Result Date: 10/05/2020 Right Eye Quality was good. Scan locations included subfoveal. Central Foveal Thickness: 239. Progression has been stable. Findings include retinal drusen , abnormal foveal contour. Left Eye Quality was good. Scan locations included subfoveal. Central Foveal Thickness: 261. Progression has been stable. Findings include retinal drusen , abnormal foveal contour. Notes No signs of CNVM OU, no change, with outer retinal atrophy documented with loss of the outer photoreceptor layer as well as structural  retina in the foveal region accounting for the significant decline in vision in the right eye from 2100 to now 2400. There is no sign of wet AMD thus there is no sign of treatable lesion at this time in either eye  Color Fundus Photography Optos - OU - Both Eyes  Result Date: 10/05/2020 Right Eye Progression has been stable. Disc findings include normal observations. Macula : geographic atrophy, drusen. Vessels : normal observations. Periphery : normal observations. Left Eye Progression has been stable. Disc findings include normal observations. Macula : drusen, geographic atrophy. Vessels : normal observations. Periphery : normal observations. Notes Subfoveal geographic atrophy document on color fundus photography as well as on clinical examination.  No signs of wet AMD OU. OS with incidental note of small choroidal nevus in the posterior pole no impact on acuity Incidental posterior vitreous detachment also noted on color fundus photography   Assessment/Plan Recurrent major depressive disorder, in partial remission ([place]) - Plan: Ambulatory referral to Psychiatry Will try Remeron 7.5 mg QHS Start every other day and then go every day Will help with Insomnia also Discussed all the side effects. If any Grogginess or confusion to let us know Change Wellbutrin to twice a day and take second dose early around 2 pm to prevent Insomnia Grieving - Plan: Ambulatory referral to Psychiatry Can use Xanax PRn  PTSD (post-traumatic stress disorder) Psych referral Primary insomnia Discontinue Trazodone Will try Remeron Stage 3b chronic kidney disease ([place]) Creat stable Has seen Dr [name] before History of sarcoma Recent Imaging has been negative for any Recurrence  Essential hypertension Discussed in detail how he need to continue taking lisinopril and not take it PRn  B12 deficiency Takes B12 injections Level was good  history of Tonsillar cancer (HCC) Is going for further work up at  [facility]  Other issues   Hypothyroid Follows with Dr Chalmers Cater Lymphedema of left leg Negative work up in past Uses Ted hoses PRN   Large vessel vasculitis ([place]) Follows with Dr Dossie Der and Uses Prednisone PRN OSA (obstructive sleep apnea) Does not use CPAP as it has not helped Hyperlipidemia, unspecified hyperlipidemia type Discussed about Pros and Cons of Statin He is not interested right now  Labs/tests ordered:  * No order type specified * Next appt:  11/16/2020   Total time spent in this patient care encounter was  60_  minutes; greater than 50% of the visit spent counseling patient and staff, reviewing records , Labs and coordinating care for problems addressed at this encounter.

## 2020-10-26 DIAGNOSIS — N3020 Other chronic cystitis without hematuria: Secondary | ICD-10-CM | POA: Diagnosis not present

## 2020-10-26 DIAGNOSIS — N35012 Post-traumatic membranous urethral stricture: Secondary | ICD-10-CM | POA: Diagnosis not present

## 2020-10-26 DIAGNOSIS — R351 Nocturia: Secondary | ICD-10-CM | POA: Diagnosis not present

## 2020-10-27 ENCOUNTER — Encounter: Payer: Self-pay | Admitting: Internal Medicine

## 2020-11-03 ENCOUNTER — Other Ambulatory Visit: Payer: Self-pay | Admitting: Family Medicine

## 2020-11-03 DIAGNOSIS — F419 Anxiety disorder, unspecified: Secondary | ICD-10-CM

## 2020-11-03 DIAGNOSIS — M79671 Pain in right foot: Secondary | ICD-10-CM | POA: Diagnosis not present

## 2020-11-03 DIAGNOSIS — N3020 Other chronic cystitis without hematuria: Secondary | ICD-10-CM | POA: Diagnosis not present

## 2020-11-03 DIAGNOSIS — L89892 Pressure ulcer of other site, stage 2: Secondary | ICD-10-CM | POA: Diagnosis not present

## 2020-11-03 DIAGNOSIS — N35012 Post-traumatic membranous urethral stricture: Secondary | ICD-10-CM | POA: Diagnosis not present

## 2020-11-03 DIAGNOSIS — M2041 Other hammer toe(s) (acquired), right foot: Secondary | ICD-10-CM | POA: Diagnosis not present

## 2020-11-03 DIAGNOSIS — L84 Corns and callosities: Secondary | ICD-10-CM | POA: Diagnosis not present

## 2020-11-04 DIAGNOSIS — E039 Hypothyroidism, unspecified: Secondary | ICD-10-CM | POA: Diagnosis not present

## 2020-11-04 DIAGNOSIS — R1312 Dysphagia, oropharyngeal phase: Secondary | ICD-10-CM | POA: Diagnosis not present

## 2020-11-04 DIAGNOSIS — C099 Malignant neoplasm of tonsil, unspecified: Secondary | ICD-10-CM | POA: Diagnosis not present

## 2020-11-04 DIAGNOSIS — C770 Secondary and unspecified malignant neoplasm of lymph nodes of head, face and neck: Secondary | ICD-10-CM | POA: Diagnosis not present

## 2020-11-04 DIAGNOSIS — Z85818 Personal history of malignant neoplasm of other sites of lip, oral cavity, and pharynx: Secondary | ICD-10-CM | POA: Diagnosis not present

## 2020-11-04 DIAGNOSIS — K117 Disturbances of salivary secretion: Secondary | ICD-10-CM | POA: Diagnosis not present

## 2020-11-04 DIAGNOSIS — C4921 Malignant neoplasm of connective and soft tissue of right lower limb, including hip: Secondary | ICD-10-CM | POA: Diagnosis not present

## 2020-11-04 DIAGNOSIS — R2241 Localized swelling, mass and lump, right lower limb: Secondary | ICD-10-CM | POA: Diagnosis not present

## 2020-11-04 DIAGNOSIS — Z923 Personal history of irradiation: Secondary | ICD-10-CM | POA: Diagnosis not present

## 2020-11-16 ENCOUNTER — Telehealth: Payer: Self-pay | Admitting: Adult Health

## 2020-11-16 ENCOUNTER — Encounter: Payer: Medicare Other | Admitting: Adult Health

## 2020-11-16 ENCOUNTER — Other Ambulatory Visit: Payer: Self-pay

## 2020-11-16 DIAGNOSIS — U071 COVID-19: Secondary | ICD-10-CM

## 2020-11-16 MED ORDER — NIRMATRELVIR/RITONAVIR (PAXLOVID) TABLET (RENAL DOSING): 2.0000 | ORAL_TABLET | Freq: Two times a day (BID) | ORAL | 0 refills | Status: AC

## 2020-11-16 NOTE — Telephone Encounter (Signed)
Nurse from [facility] reported that he is positive for covid and has some cold symptoms, malaise, and had vomiting which resolved. He is at risk for severe disease and would benefit from renal dose Paxlovid. Not on anticoagulation

## 2020-11-30 ENCOUNTER — Other Ambulatory Visit: Payer: Self-pay

## 2020-11-30 ENCOUNTER — Encounter: Payer: Self-pay | Admitting: Adult Health

## 2020-11-30 ENCOUNTER — Non-Acute Institutional Stay: Payer: Medicare Other | Admitting: Adult Health

## 2020-11-30 VITALS — BP 92/56 | HR 68 | Temp 96.4°F | Ht 71.0 in | Wt 159.2 lb

## 2020-11-30 DIAGNOSIS — F4310 Post-traumatic stress disorder, unspecified: Secondary | ICD-10-CM

## 2020-11-30 DIAGNOSIS — E034 Atrophy of thyroid (acquired): Secondary | ICD-10-CM | POA: Diagnosis not present

## 2020-11-30 DIAGNOSIS — F331 Major depressive disorder, recurrent, moderate: Secondary | ICD-10-CM

## 2020-11-30 DIAGNOSIS — E538 Deficiency of other specified B group vitamins: Secondary | ICD-10-CM | POA: Diagnosis not present

## 2020-11-30 DIAGNOSIS — F5101 Primary insomnia: Secondary | ICD-10-CM | POA: Diagnosis not present

## 2020-11-30 DIAGNOSIS — N1831 Chronic kidney disease, stage 3a: Secondary | ICD-10-CM

## 2020-11-30 DIAGNOSIS — I890 Lymphedema, not elsewhere classified: Secondary | ICD-10-CM

## 2020-11-30 DIAGNOSIS — E782 Mixed hyperlipidemia: Secondary | ICD-10-CM | POA: Diagnosis not present

## 2020-11-30 NOTE — Progress Notes (Signed)
Location: [facility]  POS: clinic  Provider:  Cindi Carbon, [place] [phone]   Code Status: DNR Goals of Care:  Advanced Directives 11/30/2020  Does Patient Have a Medical Advance Directive? Yes  Type of Paramedic of [place];Living will;Out of facility DNR (pink MOST or yellow form)  Does patient want to make changes to medical advance directive? No - Patient declined  Copy of [place] in Chart? Yes - validated most recent copy scanned in chart (See row information)  Would patient like information on creating a medical advance directive? -  Pre-existing out of facility DNR order (yellow form or pink MOST form) -     Chief Complaint  Patient presents with  . Medical Management of Chronic Issues    Patient returns to the clinic for 4 month follow up.     HPI: Patient is a 87 y.o. male seen today for medical management of chronic diseases.    He reports he decided not to try Remeron as he was concerned for side effects. He is still having trouble sleeping but not as bad. He is using xanax as needed for anxiety. He had a referral to psychiatry last month for depression and PTSD but he feels like he doesn't want to go unless they do specialized treatment with eye movements for PTSD.  He still gets tearful very easily. He is participating in activities at [facility]. He knows the name to the grief counselor with [facility] and has been there. Also tends a support group on campus for grief.   He is taking lisinopril daily. BP 92/56  CKD  Lab Results  Component Value Date   BUN 34 (A) 10/06/2020   Lab Results  Component Value Date   CREATININE 1.4 (A) 10/06/2020   Recently saw Dr. 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 He denies any issues swallowing or chewing.  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 Has chronic edema in his leg and wears compression hose.   Going to see pulmonary regarding sleep apnea in Jan. Not currently using cpap.   Past Medical History:  Diagnosis Date  . Age-related macular degeneration, dry, both eyes   . 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 Basal cell carcinoma (BCC) of eye   . CKD (chronic kidney disease), stage III [facility])    nephrologist-- dr Johnette Abraham. [name]  . Complication of anesthesia    post op acute urinary retention  . DDD (degenerative disc disease)   . Fatigue    pt evalulation by cardiology--- dr Fransico Michael, note 05-21-2019 in epic,(non cardiac)  normal echo and event monitor showed extra beats of atrial tachycardia , no atrial fib, and rare ecotpy  . Full dentures   . GERD (gastroesophageal reflux disease)   . History of basal cell carcinoma (BCC) excision    12-25-2018 MOH's reconstruction left lower eyelid  . History of external beam radiation therapy 2002   prostate cancer  and boost with radioative prostate seed implants  . History of prostate cancer DX  2002   S/P EXTERNAL RADIATION/ RADIOACTIVE SEED IMPLANTS  2003  . History  of squamous cell carcinoma excision    2012--  RIGHT LOWER EXTREM;  2019 LEFT LOWER EXTREMITIY  . HTN (hypertension)    followed by pcp  . Hyperlipidemia   . Hypothyroidism    followed by pcp--- acquired atrophy due to radiation;  previously seen by endocrinologist-  dr balan--- per pt takes his own thyroid med. called NP Thyroid 90 mg daily, does not take synthroid  . Large vessel vasculitis ([place])    rheumotologist-  dr syed --- IgG4 related aoritis involving renal arteries ,  pt treated with predisone.  (09-06-2019  per pt no longer prednisone last taken one month ago 07/ 2021, weaned off by pcp per pt request)  . Left  renal artery stenosis [facility])    nephrologist--- dr [name]--- with aneurysm proximal   . Lymphedema of left leg    followed by [facility]---- pt stated uses compression hose  . OSA (obstructive sleep apnea) 09-06-2019  per pt lasted used cpap approx. 2019, stated felt he no longer needed    pt retested 11-13-2016 at [facility] w/ dr ather-- moderate to severe OSA , AHI 16.4/h/  03-24-2017   . Pulmonary nodule followed by [facility]   per CT 06-11-2019  in [place] in epic done at [place],  LUL nodule , not mets  . Renal cyst, right   . Restless legs syndrome (RLS)   . S/P radiation therapy     for tonsillar cancer (head and neck) completed 01-26-2012 at [facility]);   and XRT for right lower leg sarcoma --- completed 06-06-2017  . Saliva decreased   . Sarcoma of lower extremity, right ([place]) oncolgoist-  dr Juluis Rainier ([facility]) & Dr Viona Gilmore. Cherre Blanc ([facility])   dx 02-14-2017 w/ needle core bx;  high grade pleomorphic spindle cell sarcoma , grade 2 (cT2N0M0); 03-15-2017  radical resection sarcoma tumor right lower leg  and completed radiation 06-06-2017  . Self-catheterizes urinary bladder    QID   AND   PRN  . Tonsillar cancer ([place]) 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+) (Stage IVA)---- dx oct 2013  ----s/p left tonsillectomy and concurent chemo and radiation/  ended 01-26-2012-- no surgical intervention---  residuals ( dry mouth, decreased saliva)   oncologist at [facility]--  dr brizel--  HX OF -- NO RECURRENCE  . Urethral false passage 07/2019  . Urethral stricture urologist--- dr Jeffie Pollock   chronic---  post urethral dilation's  . Urinary retention with incomplete bladder emptying   . Vitamin B 12 deficiency 01/28/2011  . Vitamin D deficiency     Past Surgical History:  Procedure Laterality Date  . BALLOON DILATION N/A 11/10/2014   Procedure: CYSTO BALLOON DILATION AND RETROGRADE URETHROGRAM ;  Surgeon: Bjorn Loser, MD;  Location:  [facility];  Service: Urology;  Laterality: N/A;  . CATARACT EXTRACTION W/ INTRAOCULAR LENS  IMPLANT, BILATERAL  2012  approx  . CYSTO/ BALLOON DILATION OF  URETHRAL STRICTURE  12-25-2010  . CYSTOSCOPY WITH RETROGRADE URETHROGRAM N/A 03/30/2016   Procedure: CYSTOSCOPY WITH RETROGRADE URETHROGRAM AND BALLOON DILATION with cystogram;  Surgeon: Bjorn Loser, MD;  Location: [facility];  Service: Urology;  Laterality: N/A;  . CYSTOSCOPY WITH URETHRAL DILATATION  05/31/2011   Procedure: CYSTOSCOPY WITH URETHRAL DILATATION;  Surgeon: Reece Packer, MD;  Location: [place];  Service: Urology;  Laterality: N/A;  BALLOON DILATION  . CYSTOSCOPY WITH URETHRAL DILATATION N/A 09/13/2016   Procedure: CYSTOSCOPY  WITH URETHRAL BALLOON DILATATION;  Surgeon: Bjorn Loser, MD;  Location[facility];  Service: Urology;  Laterality: N/A;  . CYSTOSCOPY WITH URETHRAL DILATATION N/A 03/30/2017   Procedure: CYSTOSCOPY WITH BALLOON URETHRAL DILATATION;  Surgeon: Bjorn Loser, MD;  Location: [place];  Service: Urology;  Laterality: N/A;  . CYSTOSCOPY WITH URETHRAL DILATATION N/A 09/12/2019   Procedure: CYSTOSCOPY WITH URETHRAL DILATATION;  Surgeon: Irine Seal, MD;  Location[facility];  Service: Urology;  Laterality: N/A;  . CYSTOSCOPY/RETROGRADE/URETEROSCOPY N/A 05/22/2012   Procedure: CYSTOSCOPY BALLOON DILATION RETROGRADE URETEROGRAM ;  Surgeon: Reece Packer, MD;  Location: [place];  Service: Urology;  Laterality: N/A;  . CYTSO/  DILATATION URETHRAL STRICTURE/  BX PROSTATIC URETHRA/  REMOVAL FOREIGN BODIES  06-25-2010   [facility]  . ECTROPION REPAIR Left 06/18/2019   lower eyelid  . INGUINAL HERNIA REPAIR Right 2000  . MOHS SURGERY  01/ 2019    [facility]   left lower leg for SCC  . MOHS SURGERY  12/25/2018   for [facility] of left lower eyelid;  the second stage Hughs flap release 01-09-2019  .  RADICAL RESECTION OF SARCOMA TUMOR   03-15-2017   [facility]   RIGHT LOWER LEG , CALF AREA  . RADIOACTIVE SEED IMPLANTS, PROSTATE  JAN  2003  . SKIN LESION EXCISION  07/2012   MOST  right shoulder  . TONSILLECTOMY Left 11-10-2011  @[facility]    tonsillar cancer  . TRANSURETHRAL INCISION OF BLADDER NECK N/A 09/12/2019   Procedure: TRANSURETHRAL INCISION OF BLADDER NECK;  Surgeon: Irine Seal, MD;  Location: [facility];  Service: Urology;  Laterality: N/A;    Allergies  Allergen Reactions  . Cefixime Rash  . Contrast Media [Iodinated Diagnostic Agents] Other (See Comments)    Avoid due to renal disease  . Macrobid [Nitrofurantoin Macrocrystal] Swelling  . Nsaids Other (See Comments)    Has been told no NSAIDs, Ibuprofen Renal insufficiency hx.   . Cephalosporins Rash    Outpatient Encounter Medications as of 11/30/2020  Medication Sig  . ALPRAZolam (XANAX) 0.25 MG tablet Take 1 tablet by mouth twice daily as needed for anxiety  . buPROPion (WELLBUTRIN) 100 MG tablet Take 1 tablet (100 mg total) by mouth 3 (three) times daily. (Patient taking differently: Take 100 mg by mouth 2 (two) times daily.)  . Cholecalciferol (VITAMIN D) 50 MCG (2000 UT) CAPS Take by mouth every other day.  . Cranberry-Vitamin C (AZO CRANBERRY URINARY TRACT PO) Take by mouth.  . cyanocobalamin (,VITAMIN B-12,) 1000 MCG/ML injection Inject 1 mL (1,000 mcg total) into the muscle every 14 (fourteen) days.  . Garlic (GARLIQUE PO) Take by mouth every other day.  . lisinopril (ZESTRIL) 10 MG tablet Take 1 tablet by mouth once daily  . RESVERATROL PO Take 100 mg by mouth daily.   Marland Kitchen thyroid (ARMOUR) 90 MG tablet Take 90 mg by mouth daily.  . TURMERIC CURCUMIN PO Take 2 tablets by mouth daily.   Marland Kitchen UNABLE TO FIND [place] Bone take once daily  . UNABLE TO FIND [place] take 4 tablets once daily  . UNABLE TO FIND Comprehensive immune support take 2 tablets once daily  . UNABLE TO FIND Modified citrus pectin take 4  tablets once daily  . UNABLE TO FIND Celery seed take 2 tablets by mouth once daily  . UNABLE TO FIND Maxivision multiple vitamin for eyes  . [DISCONTINUED] mirtazapine (REMERON) 7.5 MG tablet Take 1 tablet (7.5 mg  total) by mouth at bedtime. Start with 3/week and then go to everyday If any issue with grogginess stop and let us know   No facility-administered encounter medications on file as of 11/30/2020.    Review of Systems:  Review of Systems  Constitutional:  Positive for fatigue. Negative for activity change, appetite change, chills, diaphoresis, fever and unexpected weight change.  HENT:  Negative for sinus pressure, sinus pain and trouble swallowing.   Eyes:  Negative for pain, discharge and itching.  Respiratory:  Negative for cough, shortness of breath, wheezing and stridor.   Cardiovascular:  Positive for leg swelling. Negative for chest pain and palpitations.  Gastrointestinal:  Negative for abdominal distention, abdominal pain, constipation and diarrhea.  Genitourinary:  Negative for difficulty urinating and dysuria.  Musculoskeletal:  Negative for arthralgias, back pain, gait problem, joint swelling and myalgias.  Neurological:  Negative for dizziness, seizures, syncope, facial asymmetry, speech difficulty, weakness and headaches.  Hematological:  Negative for adenopathy. Does not bruise/bleed easily.  Psychiatric/Behavioral:  Positive for dysphoric mood and sleep disturbance. Negative for agitation, behavioral problems, confusion, self-injury and suicidal ideas. The patient is nervous/anxious.    Health Maintenance  Topic Date Due  . Zoster Vaccines- Shingrix (1 of 2) Never done  . TETANUS/TDAP  01/17/2013  . COVID-19 Vaccine (4 - Booster for Moderna series) 01/28/2020  . INFLUENZA VACCINE  08/17/2020  . Pneumonia Vaccine 52+ Years old  Completed  . HPV VACCINES  Aged Out    Physical Exam: There were no vitals filed for this visit. There is no height or weight on file  to calculate BMI. Physical Exam Vitals and nursing note reviewed.  Constitutional:      General: He is not in acute distress.    Appearance: He is not diaphoretic.  HENT:     Head: Normocephalic and atraumatic.  Neck:     Thyroid: No thyromegaly.     Vascular: No JVD.     Trachea: No tracheal deviation.  Cardiovascular:     Rate and Rhythm: Normal rate and regular rhythm.     Heart sounds: No murmur heard. Pulmonary:     Effort: Pulmonary effort is normal. No respiratory distress.     Breath sounds: Normal breath sounds. No wheezing.  Abdominal:     General: Bowel sounds are normal. There is no distension.     Palpations: Abdomen is soft.     Tenderness: There is no abdominal tenderness.  Musculoskeletal:     Right lower leg: Edema present.     Left lower leg: No edema.  Lymphadenopathy:     Cervical: No cervical adenopathy.  Skin:    General: Skin is warm and dry.  Neurological:     Mental Status: He is alert and oriented to person, place, and time.     Cranial Nerves: No cranial nerve deficit.    Labs reviewed: Basic Metabolic Panel: Recent Labs    07/28/20 0000 08/13/20 0000 10/06/20 0000  NA 140  --  139  K 4.6  --  4.6  CL 102  --  101  CO2 28*  --  27*  BUN 28*  --  34*  CREATININE 1.5*  --  1.4*  CALCIUM 9.4  --  9.4  TSH  --  2.03  --    Liver Function Tests: Recent Labs    07/28/20 0000 10/06/20 0000  AST 12* 10*  ALT 11 4*  ALKPHOS 57 56  ALBUMIN 4.2 4.2   No results  for input(s): LIPASE, AMYLASE in the last 8760 hours. No results for input(s): AMMONIA in the last 8760 hours. CBC: Recent Labs    07/28/20 0000 10/06/20 0000  WBC 5.0 4.8  HGB 12.8* 13.4*  HCT 37* 39*  PLT 203 219   Lipid Panel: Recent Labs    07/28/20 0000  CHOL 218*  HDL 65  LDLCALC 139  TRIG 73   No results found for: HGBA1C  Procedures since last visit: No results found.  Assessment/Plan  1. Moderate episode of recurrent major depressive disorder  ([place]) Continues to experience feelings of depression and grief. I encouraged him to f/u with psychiatry, remain active, social, attend grief counseling, and continue the wellbutrin for now.   2. PTSD (post-traumatic stress disorder) Recommend f/u with psychiatry but he would like to hold off for now.   3. Stage 3a chronic kidney disease ([place]) Continue to periodically monitor BMP and avoid nephrotoxic agents  4. Mixed hyperlipidemia Lab Results  Component Value Date   CHOL 218 (A) 07/28/2020   HDL 65 07/28/2020   LDLCALC 139 07/28/2020   TRIG 73 07/28/2020      5. Hypothyroidism due to acquired atrophy of thyroid Lab Results  Component Value Date   TSH 2.03 08/13/2020   Continue thyroid armour 90 mg  6. Lymphedema of left leg Continue compression hose left leg  7. Primary insomnia Continues on melatonin otc only per pt Doing better in this regard, following up with pulmonary regarding this and sleep apnea.   8. B12 deficiency Lab Results  Component Value Date   WBC 4.8 10/06/2020   HGB 13.4 (A) 10/06/2020   HCT 39 (A) 10/06/2020   MCV 97.5 08/24/2017   PLT 219 10/06/2020   Continue B 12 inj  Recommend shingrix vaccine and tetanus tdap  Labs/tests ordered:  * No order type specified * CBC BMP Next appt:  4 months with Dr. Darnell Level  Total time 65min:  time greater than 50% of total time spent doing pt counseling and coordination of care

## 2020-11-30 NOTE — Patient Instructions (Signed)
Recommend shingrix vaccine and tetanus tdap

## 2020-12-02 DIAGNOSIS — I872 Venous insufficiency (chronic) (peripheral): Secondary | ICD-10-CM | POA: Diagnosis not present

## 2020-12-02 DIAGNOSIS — Z85820 Personal history of malignant melanoma of skin: Secondary | ICD-10-CM | POA: Diagnosis not present

## 2020-12-02 DIAGNOSIS — L814 Other melanin hyperpigmentation: Secondary | ICD-10-CM | POA: Diagnosis not present

## 2020-12-02 DIAGNOSIS — D1801 Hemangioma of skin and subcutaneous tissue: Secondary | ICD-10-CM | POA: Diagnosis not present

## 2020-12-02 DIAGNOSIS — L853 Xerosis cutis: Secondary | ICD-10-CM | POA: Diagnosis not present

## 2020-12-02 DIAGNOSIS — L821 Other seborrheic keratosis: Secondary | ICD-10-CM | POA: Diagnosis not present

## 2020-12-02 DIAGNOSIS — R21 Rash and other nonspecific skin eruption: Secondary | ICD-10-CM | POA: Diagnosis not present

## 2020-12-02 DIAGNOSIS — L570 Actinic keratosis: Secondary | ICD-10-CM | POA: Diagnosis not present

## 2020-12-17 DIAGNOSIS — M25511 Pain in right shoulder: Secondary | ICD-10-CM | POA: Diagnosis not present

## 2020-12-31 ENCOUNTER — Other Ambulatory Visit: Payer: Self-pay

## 2020-12-31 ENCOUNTER — Encounter (INDEPENDENT_AMBULATORY_CARE_PROVIDER_SITE_OTHER): Payer: Self-pay | Admitting: Ophthalmology

## 2020-12-31 ENCOUNTER — Encounter (INDEPENDENT_AMBULATORY_CARE_PROVIDER_SITE_OTHER): Payer: Medicare Other | Admitting: Ophthalmology

## 2020-12-31 ENCOUNTER — Ambulatory Visit (INDEPENDENT_AMBULATORY_CARE_PROVIDER_SITE_OTHER): Payer: Medicare Other | Admitting: Ophthalmology

## 2020-12-31 DIAGNOSIS — H353132 Nonexudative age-related macular degeneration, bilateral, intermediate dry stage: Secondary | ICD-10-CM | POA: Diagnosis not present

## 2020-12-31 DIAGNOSIS — H353123 Nonexudative age-related macular degeneration, left eye, advanced atrophic without subfoveal involvement: Secondary | ICD-10-CM | POA: Diagnosis not present

## 2020-12-31 DIAGNOSIS — H353114 Nonexudative age-related macular degeneration, right eye, advanced atrophic with subfoveal involvement: Secondary | ICD-10-CM | POA: Diagnosis not present

## 2020-12-31 NOTE — Progress Notes (Signed)
12/31/2020     CHIEF COMPLAINT Patient presents for  Chief Complaint  Patient presents with   Blurred Vision      HISTORY OF PRESENT ILLNESS: Philip [name] is a 87 y.o. male who presents to the clinic today for:   HPI     Blurred Vision           Laterality: left eye   Onset: gradual   Quality: blurred, difficult to focus and hazy   Severity: mild   Onset: 2 months ago   Frequency: constantly         Comments   WIP- vision changes 6 month fu ou and oct set for 03/2021  Pt states, "For the last month or two I have noticed that my left eye is getting increasingly blurry. It is very noticeable and is not getting any better."  Pt denies any wavy vision, no floaters or FOL      Last edited by Kendra Opitz, COA on 12/31/2020  1:24 PM.      Referring physician: Virgie Dad, MD [place],  [place] [id number]  HISTORICAL INFORMATION:   Selected notes from the MEDICAL RECORD NUMBER       CURRENT MEDICATIONS: No current outpatient medications on file. (Ophthalmic Drugs)   No current facility-administered medications for this visit. (Ophthalmic Drugs)   Current Outpatient Medications (Other)  Medication Sig   ALPRAZolam (XANAX) 0.25 MG tablet Take 1 tablet by mouth twice daily as needed for anxiety   buPROPion (WELLBUTRIN) 100 MG tablet Take 1 tablet (100 mg total) by mouth 3 (three) times daily. (Patient taking differently: Take 100 mg by mouth 2 (two) times daily.)   Cholecalciferol (VITAMIN D) 50 MCG (2000 UT) CAPS Take by mouth every other day.   Cranberry-Vitamin C (AZO CRANBERRY URINARY TRACT PO) Take by mouth.   cyanocobalamin (,VITAMIN B-12,) 1000 MCG/ML injection Inject 1 mL (1,000 mcg total) into the muscle every 14 (fourteen) days.   Garlic (GARLIQUE PO) Take by mouth every other day.   lisinopril (ZESTRIL) 10 MG tablet Take 1 tablet by mouth once daily   RESVERATROL PO Take 100 mg by mouth daily.    thyroid (ARMOUR) 90 MG  tablet Take 90 mg by mouth daily.   TURMERIC CURCUMIN PO Take 2 tablets by mouth daily.    UNABLE TO FIND [place] Bone take once daily   UNABLE TO FIND [place] take 4 tablets once daily   UNABLE TO FIND Comprehensive immune support take 2 tablets once daily   UNABLE TO FIND Modified citrus pectin take 4 tablets once daily   UNABLE TO FIND Celery seed take 2 tablets by mouth once daily   UNABLE TO FIND Maxivision multiple vitamin for eyes   No current facility-administered medications for this visit. (Other)      REVIEW OF SYSTEMS:    ALLERGIES Allergies  Allergen Reactions   Cefixime Rash   Contrast Media [Iodinated Diagnostic Agents] Other (See Comments)    Avoid due to renal disease   Macrobid [Nitrofurantoin Macrocrystal] Swelling   Nsaids Other (See Comments)    Has been told no NSAIDs, Ibuprofen Renal insufficiency hx.    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 [name]   Complication of anesthesia    post op acute urinary retention   DDD (degenerative disc disease)    Fatigue    pt evalulation by cardiology--- dr Fransico Michael, note 05-21-2019 in epic,(non cardiac)  normal echo and event monitor showed extra beats of atrial tachycardia , no atrial fib, and rare ecotpy   Full dentures    GERD (gastroesophageal reflux disease)    History of basal cell carcinoma (BCC) excision    12-25-2018 MOH's reconstruction left lower eyelid   History of external beam radiation therapy 2002   prostate cancer  and boost with radioative prostate seed implants   History of prostate cancer DX  2002   S/P EXTERNAL RADIATION/  RADIOACTIVE SEED IMPLANTS  2003   History of squamous cell carcinoma excision    2012--  RIGHT LOWER EXTREM;  2019 LEFT LOWER EXTREMITIY   HTN (hypertension)    followed by pcp   Hyperlipidemia    Hypothyroidism    followed by pcp--- acquired atrophy due to radiation;  previously seen by endocrinologist-  dr Chalmers Cater--- per pt takes his own thyroid med. 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  (09-06-2019  per pt no longer prednisone last taken one month ago 07/ 2021, weaned off by pcp per pt request)   Left renal artery stenosis [facility])    nephrologist--- dr [name]--- with aneurysm proximal    Lymphedema of left leg    followed by [facility]---- pt stated uses compression hose   OSA (obstructive sleep apnea) 09-06-2019  per pt lasted used cpap approx. 2019, stated felt he no longer needed    pt retested 11-13-2016 at [facility] w/ dr ather-- moderate to severe OSA , AHI 16.4/h/  03-24-2017    Pulmonary nodule followed by [facility]   per CT 06-11-2019  in [place] in epic done at [place],  LUL nodule , not mets   Renal cyst, right    Restless legs syndrome (RLS)    S/P radiation therapy     for tonsillar cancer (head and neck) completed 01-26-2012 at [facility]);   and XRT for right lower leg sarcoma --- completed 06-06-2017   Saliva decreased    Sarcoma of lower extremity, right ([place]) oncolgoist-  dr Juluis Rainier ([facility]) & Dr [name] ([facility])   dx 02-14-2017 w/ needle core bx;  high grade pleomorphic spindle cell sarcoma , grade 2 (cT2N0M0); 03-15-2017  radical resection sarcoma tumor right lower leg  and completed radiation 06-06-2017   Self-catheterizes urinary bladder    QID   AND   PRN   Tonsillar cancer ([place]) 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+) (Stage IVA)---- dx  oct 2013  ----s/p left tonsillectomy and concurent chemo and radiation/  ended 01-26-2012-- no surgical intervention---  residuals ( dry mouth, decreased saliva)   oncologist at [place]--  dr brizel--  HX OF -- NO RECURRENCE   Urethral false passage 07/2019   Urethral stricture urologist--- dr Jeffie Pollock   chronic---  post urethral dilation's   Urinary retention with incomplete bladder emptying    Vitamin B 12 deficiency 01/28/2011   Vitamin D deficiency  Past Surgical History:  Procedure Laterality Date   BALLOON DILATION N/A 11/10/2014   Procedure: CYSTO BALLOON DILATION AND RETROGRADE URETHROGRAM ;  Surgeon: Bjorn Loser, MD;  Location: [facility];  Service: Urology;  Laterality: N/A;   CATARACT EXTRACTION W/ INTRAOCULAR LENS  IMPLANT, BILATERAL  2012  approx   CYSTO/ BALLOON DILATION OF  URETHRAL STRICTURE  12-25-2010   CYSTOSCOPY WITH RETROGRADE URETHROGRAM N/A 03/30/2016   Procedure: CYSTOSCOPY WITH RETROGRADE URETHROGRAM AND BALLOON DILATION with cystogram;  Surgeon: Bjorn Loser, MD;  Location: [facility];  Service: Urology;  Laterality: N/A;   CYSTOSCOPY WITH URETHRAL DILATATION  05/31/2011   Procedure: CYSTOSCOPY WITH URETHRAL DILATATION;  Surgeon: Reece Packer, MD;  Location: [facility];  Service: Urology;  Laterality: N/A;  BALLOON DILATION   CYSTOSCOPY WITH URETHRAL DILATATION N/A 09/13/2016   Procedure: CYSTOSCOPY WITH URETHRAL BALLOON DILATATION;  Surgeon: Bjorn Loser, MD;  Location: [facility];  Service: Urology;  Laterality: N/A;   CYSTOSCOPY WITH URETHRAL DILATATION N/A 03/30/2017   Procedure: CYSTOSCOPY WITH BALLOON URETHRAL DILATATION;  Surgeon: Bjorn Loser, MD;  Location: [facility];  Service: Urology;  Laterality: N/A;   CYSTOSCOPY WITH URETHRAL DILATATION N/A 09/12/2019   Procedure: CYSTOSCOPY WITH URETHRAL DILATATION;  Surgeon: Irine Seal, MD;  Location: [facility];  Service: Urology;  Laterality: N/A;   CYSTOSCOPY/RETROGRADE/URETEROSCOPY N/A 05/22/2012   Procedure: CYSTOSCOPY BALLOON DILATION RETROGRADE URETEROGRAM ;  Surgeon: Reece Packer, MD;  Location: [facility];  Service: Urology;  Laterality: N/A;   CYTSO/  DILATATION URETHRAL STRICTURE/  BX PROSTATIC URETHRA/  REMOVAL FOREIGN BODIES  06-25-2010   [facility]   ECTROPION REPAIR Left 06/18/2019   lower eyelid   INGUINAL HERNIA REPAIR Right 2000   MOHS SURGERY  01/ 2019    [facility]   left lower leg for [facility]   MOHS SURGERY  12/25/2018   for [facility] of left lower eyelid;  the second stage Hughs flap release 01-09-2019   RADICAL RESECTION OF SARCOMA TUMOR   03-15-2017   [facility]   RIGHT LOWER LEG , CALF AREA   RADIOACTIVE SEED IMPLANTS, PROSTATE  JAN  2003   SKIN LESION EXCISION  07/2012   MOST  right shoulder   TONSILLECTOMY Left 11-10-2011  @[facility]    tonsillar cancer   TRANSURETHRAL INCISION OF BLADDER NECK N/A 09/12/2019   Procedure: TRANSURETHRAL INCISION OF BLADDER NECK;  Surgeon: Irine Seal, MD;  Location: [facility];  Service: Urology;  Laterality: N/A;    FAMILY HISTORY Family History  Problem Relation Age of Onset   Stroke Father     SOCIAL HISTORY Social History   Tobacco Use   Smoking status: Never   Smokeless tobacco: Never  Vaping Use   Vaping Use: Never used  Substance Use Topics   Alcohol use: Yes    Alcohol/week: 7.0 standard drinks    Types: 7 Glasses of wine per week    Comment: 8oz, glass or red wine   Drug use: No         OPHTHALMIC EXAM:  Base Eye Exam     Visual Acuity (ETDRS)       Right Left   Dist cc 20/200 20/50   Dist ph cc NI 20/25 -1    Correction: Glasses         Tonometry (Tonopen, 1:28 PM)       Right Left   Pressure 11 12  Pupils       Pupils Shape React APD   Right PERRL Round Brisk None   Left PERRL Round Brisk None         Visual Fields (Counting fingers)       Left Right    Full  Full         Extraocular Movement       Right Left    Full Full         Neuro/Psych     Oriented x3: Yes   Mood/Affect: Normal         Dilation     Left eye: 1.0% Mydriacyl, 2.5% Phenylephrine @ 1:28 PM           Slit Lamp and Fundus Exam     External Exam       Right Left   External Normal Normal         Slit Lamp Exam       Right Left   Lids/Lashes Normal Normal   Conjunctiva/Sclera White and quiet White and quiet   Cornea Clear Clear   Anterior Chamber Deep and quiet Deep and quiet   Iris Round and reactive Round and reactive   Lens Posterior chamber intraocular lens Posterior chamber intraocular lens   Anterior Vitreous Normal Normal         Fundus Exam       Right Left   Posterior Vitreous Posterior vitreous detachment Posterior vitreous detachment   Disc Normal Normal   C/D Ratio 0.3 0.45   Macula Intermediate age related macular degeneration, Retinal pigment epithelial atrophy, Soft drusen, no exudates, no hemorrhage, Retinal pigment epithelial mottling Intermediate age related macular degeneration, Retinal pigment epithelial atrophy, Soft drusen, no exudates, no hemorrhage, Retinal pigment epithelial mottling, no macular thickening, pigmented atrophy, accumulation of subfoveal calcified type drusen apparent clinically   Vessels Normal Normal   Periphery Normal Normal            IMAGING AND PROCEDURES  Imaging and Procedures for 12/31/20  OCT, Retina - OU - Both Eyes       Right Eye Quality was good. Scan locations included subfoveal. Central Foveal Thickness: 233. Progression has been stable. Findings include retinal drusen , abnormal foveal contour.   Left Eye Quality was good. Scan locations included subfoveal. Central Foveal Thickness: 253. Progression has been stable. Findings include retinal drusen , abnormal foveal contour.   Notes No signs of CNVM OU, no change, with outer retinal atrophy documented with loss of the  outer photoreceptor layer as well as structural retina in the foveal region accounting for the significant decline in vision in the right eye from 20/100 to now 20/200.  There is no sign of wet AMD thus there is no sign of treatable lesion at this time in either eye     Color Fundus Photography Optos - OU - Both Eyes       Right Eye Progression has been stable. Disc findings include normal observations. Macula : geographic atrophy, drusen. Vessels : normal observations. Periphery : normal observations.   Left Eye Progression has been stable. Disc findings include normal observations. Macula : drusen, geographic atrophy. Vessels : normal observations. Periphery : normal observations.   Notes Subfoveal geographic atrophy document on color fundus photography as well as on clinical examination.  No signs of wet AMD OU.  OS with incidental note of small choroidal nevus in the posterior pole no impact on acuity  Incidental posterior vitreous detachment also noted  on color fundus photography             ASSESSMENT/PLAN:  Advanced nonexudative age-related macular degeneration of right eye with subfoveal involvement The nature of dry age related macular degeneration was discussed with the patient as well as its possible conversion to wet. The results of the AREDS 2 study was discussed with the patient. A diet rich in dark leafy green vegetables was advised and specific recommendations were made regarding supplements with AREDS 2 formulation . Control of hypertension and serum cholesterol may slow the disease. Smoking cessation is mandatory to slow the disease and diminish the risk of progressing to wet age related macular degeneration. The patient was instructed in the use of an [place] and was told to return immediately for any changes in the Grid. Stressed to the patient do not rub eyes  The nature of age realated macular degeneration (ARMD)is explained as follows: The dry form refers  to the progressive loss of normal blood supply to the central vision as a result of a combination of factors which include aging blood supply (arteriosclerosis, hardening of the arteries), genetics, smoking habits, and history of hypertension. Currently, no eye medications or vitamins slow this type of aging effect upon vision, however cessation of smoking and controlling hypertension help slow the disorder. The following analogy helps explain this: I describe the dry form of ARMD like a house of the same age as your eyes, which shows typical wear and tear of age upon the house structure and appearance. Like the aging house which can fall down structurally, the dry form of ARMD can deteriorate the structure of the macula (center) of the retina, most often gradually and affect central vision tasks such as reading and driving. The wet form of ARMD refers to the development of abnormally growing blood vessels usually near or under the central vision, with potential risk of permanent visual changes or vision losses. This complication of the Dry form of ARMD may be moderately reduced by use of AREDS 2 formula multivitamins daily. I describe the Wet form of ARMD as like the development of a fire in an aging house (DRY ARMD). It may develop suddenly, progress rapidly and be destructive based on where it starts and how big the fire is when found. In the eye, the house fire analogy refers to the abnormal blood vessels growing destructively near the central vison in the retina, or film of the eye. Halting the growth of blood vessels with laser (hot or cold) or injectable medications is best way to put the fire out. Many patients will experience a stabilization or even improvement in vision with a treatment course, while others may still face a loss of vision. The use of injectable medications has revolutionized therapy and is currently the only proven therapy to provide the chance of stable or improved acuity from new and  recent destructive wet ARMD.  Advanced nonexudative age-related macular degeneration of left eye without subfoveal involvement Patient now symptomatic with perifoveal dry macular degeneration, yet no foveal vision loss confirmed at this time.  I explained to the patient that progression of retinal vascular blood supply loss cannot be halted with medication usages either locally or systemically at this time     ICD-10-CM   1. Intermediate stage nonexudative age-related macular degeneration of both eyes  H35.3132 OCT, Retina - OU - Both Eyes    Color Fundus Photography Optos - OU - Both Eyes    2. Advanced nonexudative age-related macular degeneration  of right eye with subfoveal involvement  H35.3114     3. Advanced nonexudative age-related macular degeneration of left eye without subfoveal involvement  H35.3123       1.  2.  3.  Ophthalmic Meds Ordered this visit:  No orders of the defined types were placed in this encounter.      Return in about 4 months (around 05/01/2021) for DILATE OU, OCT, COLOR FP.  There are no Patient Instructions on file for this visit.   Explained the diagnoses, plan, and follow up with the patient and they expressed understanding.  Patient expressed understanding of the importance of proper follow up care.   Clent Demark Velma Hanna M.D. Diseases & Surgery of the Retina and Vitreous Retina & Diabetic [place] 12/31/20     Abbreviations: M myopia (nearsighted); A astigmatism; H hyperopia (farsighted); P presbyopia; Mrx spectacle prescription;  CTL contact lenses; OD right eye; OS left eye; OU both eyes  XT exotropia; ET esotropia; PEK punctate epithelial keratitis; PEE punctate epithelial erosions; DES dry eye syndrome; MGD meibomian gland dysfunction; ATs artificial tears; PFAT's preservative free artificial tears; [place] nuclear sclerotic cataract; PSC posterior subcapsular cataract; ERM epi-retinal membrane; PVD posterior vitreous detachment; RD retinal  detachment; DM diabetes mellitus; DR diabetic retinopathy; NPDR non-proliferative diabetic retinopathy; PDR proliferative diabetic retinopathy; CSME clinically significant macular edema; DME diabetic macular edema; dbh dot blot hemorrhages; CWS cotton wool spot; POAG primary open angle glaucoma; C/D cup-to-disc ratio; HVF humphrey visual field; GVF goldmann visual field; OCT optical coherence tomography; IOP intraocular pressure; BRVO Branch retinal vein occlusion; CRVO central retinal vein occlusion; CRAO central retinal artery occlusion; BRAO branch retinal artery occlusion; RT retinal tear; SB scleral buckle; PPV pars plana vitrectomy; VH Vitreous hemorrhage; PRP panretinal laser photocoagulation; IVK intravitreal kenalog; VMT vitreomacular traction; MH Macular hole;  NVD neovascularization of the disc; NVE neovascularization elsewhere; AREDS age related eye disease study; ARMD age related macular degeneration; POAG primary open angle glaucoma; EBMD epithelial/anterior basement membrane dystrophy; ACIOL anterior chamber intraocular lens; IOL intraocular lens; PCIOL posterior chamber intraocular lens; Phaco/IOL phacoemulsification with intraocular lens placement; [place] photorefractive keratectomy; LASIK laser assisted in situ keratomileusis; HTN hypertension; DM diabetes mellitus; COPD chronic obstructive pulmonary disease

## 2020-12-31 NOTE — Assessment & Plan Note (Signed)
The nature of dry age related macular degeneration was discussed with the patient as well as its possible conversion to wet. The results of the AREDS 2 study was discussed with the patient. A diet rich in dark leafy green vegetables was advised and specific recommendations were made regarding supplements with AREDS 2 formulation . Control of hypertension and serum cholesterol may slow the disease. Smoking cessation is mandatory to slow the disease and diminish the risk of progressing to wet age related macular degeneration. The patient was instructed in the use of an [place] Grid and was told to return immediately for any changes in the Grid. Stressed to the patient do not rub eyes  The nature of age realated macular degeneration (ARMD)is explained as follows: The dry form refers to the progressive loss of normal blood supply to the central vision as a result of a combination of factors which include aging blood supply (arteriosclerosis, hardening of the arteries), genetics, smoking habits, and history of hypertension. Currently, no eye medications or vitamins slow this type of aging effect upon vision, however cessation of smoking and controlling hypertension help slow the disorder. The following analogy helps explain this: I describe the dry form of ARMD like a house of the same age as your eyes, which shows typical wear and tear of age upon the house structure and appearance. Like the aging house which can fall down structurally, the dry form of ARMD can deteriorate the structure of the macula (center) of the retina, most often gradually and affect central vision tasks such as reading and driving. The wet form of ARMD refers to the development of abnormally growing blood vessels usually near or under the central vision, with potential risk of permanent visual changes or vision losses. This complication of the Dry form of ARMD may be moderately reduced by use of AREDS 2 formula multivitamins daily. I describe the  Wet form of ARMD as like the development of a fire in an aging house (DRY ARMD). It may develop suddenly, progress rapidly and be destructive based on where it starts and how big the fire is when found. In the eye, the house fire analogy refers to the abnormal blood vessels growing destructively near the central vison in the retina, or film of the eye. Halting the growth of blood vessels with laser (hot or cold) or injectable medications is best way "to put the fire out". Many patients will experience a stabilization or even improvement in vision with a treatment course, while others may still face a loss of vision. The use of injectable medications has revolutionized therapy and is currently the only proven therapy to provide the chance of stable or improved acuity from new and recent destructive wet ARMD. 

## 2020-12-31 NOTE — Assessment & Plan Note (Signed)
Patient now symptomatic with perifoveal dry macular degeneration, yet no foveal vision loss confirmed at this time.  I explained to the patient that progression of retinal vascular blood supply loss cannot be halted with medication usages either locally or systemically at this time

## 2021-01-04 ENCOUNTER — Other Ambulatory Visit: Payer: Self-pay | Admitting: Internal Medicine

## 2021-01-04 ENCOUNTER — Other Ambulatory Visit: Payer: Self-pay

## 2021-01-04 MED ORDER — "BD ECLIPSE SYRINGE/NEEDLE 25G X 5/8"" 3 ML MISC": 0 refills | Status: DC

## 2021-01-06 DIAGNOSIS — L570 Actinic keratosis: Secondary | ICD-10-CM | POA: Diagnosis not present

## 2021-01-06 DIAGNOSIS — I872 Venous insufficiency (chronic) (peripheral): Secondary | ICD-10-CM | POA: Diagnosis not present

## 2021-01-06 DIAGNOSIS — L853 Xerosis cutis: Secondary | ICD-10-CM | POA: Diagnosis not present

## 2021-01-06 DIAGNOSIS — D1801 Hemangioma of skin and subcutaneous tissue: Secondary | ICD-10-CM | POA: Diagnosis not present

## 2021-01-06 DIAGNOSIS — L814 Other melanin hyperpigmentation: Secondary | ICD-10-CM | POA: Diagnosis not present

## 2021-01-06 DIAGNOSIS — R21 Rash and other nonspecific skin eruption: Secondary | ICD-10-CM | POA: Diagnosis not present

## 2021-01-25 ENCOUNTER — Encounter: Payer: Self-pay | Admitting: Neurology

## 2021-01-25 ENCOUNTER — Ambulatory Visit (INDEPENDENT_AMBULATORY_CARE_PROVIDER_SITE_OTHER): Payer: Medicare Other | Admitting: Neurology

## 2021-01-25 VITALS — BP 105/61 | HR 78 | Ht 71.0 in | Wt 166.0 lb

## 2021-01-25 DIAGNOSIS — H353123 Nonexudative age-related macular degeneration, left eye, advanced atrophic without subfoveal involvement: Secondary | ICD-10-CM

## 2021-01-25 DIAGNOSIS — F4321 Adjustment disorder with depressed mood: Secondary | ICD-10-CM

## 2021-01-25 DIAGNOSIS — G4733 Obstructive sleep apnea (adult) (pediatric): Secondary | ICD-10-CM

## 2021-01-25 DIAGNOSIS — F5102 Adjustment insomnia: Secondary | ICD-10-CM

## 2021-01-25 DIAGNOSIS — Z8669 Personal history of other diseases of the nervous system and sense organs: Secondary | ICD-10-CM

## 2021-01-25 DIAGNOSIS — H353114 Nonexudative age-related macular degeneration, right eye, advanced atrophic with subfoveal involvement: Secondary | ICD-10-CM

## 2021-01-25 DIAGNOSIS — M25511 Pain in right shoulder: Secondary | ICD-10-CM | POA: Diagnosis not present

## 2021-01-25 NOTE — Patient Instructions (Signed)

## 2021-01-25 NOTE — Progress Notes (Addendum)
SLEEP MEDICINE CLINIC    Provider:  Larey Seat, MD  Primary Care Physician:  Virgie Dad, MD [place] [id number]     Referring Provider: Royal Hawthorn, [place],  [place] [address]          Chief Complaint according to patient   Patient presents with:     New Patient (Initial Visit)           HISTORY OF PRESENT ILLNESS:  Philip [name] is a 88 y.o. Caucasian male patient seen here as a new patient referral on 01/25/2021 from Lyndel Safe, [place], NP.   Chief concern according to patient :  Rm 11, alone here after referral for insomnia. Pt was first dx with OSA by [facility] in 2010.  Used CPAP for a whole year and stopped after noticing no difference.  Around 2016 pt was again dx with OSA, used CPAP again for another year with no difference. Pt reports always being able to sleep through the night before and during the time he used CPAP. He now has trouble staying asleep.  Waking up around 2-4 AM and having to take deeper breathes first few seconds.    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[place]) (rheumotologist-  dr syed), Basal cell carcinoma (BCC) of eye, CKD (chronic kidney disease), stage III ([place]), Complication of anesthesia, DDD (degenerative disc disease), Fatigue, Full dentures, GERD (gastroesophageal reflux disease), History of basal cell carcinoma (BCC) excision, History of external beam radiation therapy (2002), History of prostate cancer (DX  2002), History of squamous cell carcinoma excision, HTN (hypertension), Hyperlipidemia, Hypothyroidism, Large vessel vasculitis ([place]), Left renal artery stenosis ([place]), Lymphedema of left leg, OSA (obstructive sleep apnea) (09-06-2019  per pt lasted used cpap approx. 2019, stated felt he no longer needed ), Pulmonary nodule (followed by [facility]), Renal cyst, right, Restless legs syndrome (RLS), S/P  radiation therapy, Saliva decreased, Sarcoma of lower extremity, right ([place]) (oncolgoist-  dr Juluis Rainier ([facility]) & Dr [name] ([facility])), Self-catheterizes urinary bladder, Tonsillar cancer ([place]) (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+) (Stage IVA)---- dx oct 2013  ----s/p left tonsillectomy and concurent chemo and radiation/  ended 01-26-2012-- no surgical intervention---  residuals ( dry mouth, decreased saliva)), Urethral false passage (07/2019), Urethral stricture (urologist--- dr Jeffie Pollock), Urinary retention with incomplete bladder emptying, Vitamin B 12 deficiency (01/28/2011), and Vitamin D deficiency.   The patient had the first sleep study in the year 2010 at [facility]-  had seen Dr Rexene Alberts for second opinion in 2019.  CPAP [place] over a year was not improving daytime sleepiness and waking up early.   I finally learned at the end of this visit that the patient had undergone a HST as well, not listed on referral, 09-06-2019  per pt lasted used cpap approx. 20198-19 stated felt he no longer needed  OSA (obstructive sleep apnea) , the HST is not stated in EPIC.    Sleep relevant medical history: wakes up once for bathroom visit, now sleeping longer than usual, sleeping until 8.30-9 AM.  may be due to grief reaction, denies depression but uses the term heartbroken.   Family medical /sleep history: No other family member on CPAP with OSA, insomnia, sleep walkers.    Social history: Born and raised in [place], [place], left school at [place] age 88 , started  flying at age 54.  moved to the [place]- 1966, widowed since 2020-08-26, after 23 years of marriage. He lives alone at [address]. Previously married in the [place], 4 daughters in the [place]. Patient is retired from Insurance underwriter, Therapist, sports and later [employer], used to have a cat, but no without pets.  Tobacco use: never .  ETOH use , 4 - 5 glasses a week maximally all his life- , Caffeine intake in form of Coffee(  1-3 cups) Soda( /) Tea ( some hot ) or energy drinks. Regular exercise in form of  walking.   Hobbies :reading, water sports, rowing.       Sleep habits are as follows: The patient's dinner time is between 6-7 PM. The patient goes to bed at 9-10 PM and continues to sleep for 4-5 hours, wakes for one bathroom breaks, but can't return to sleep easily.  The preferred sleep position is supine , with the support of 1-2 pillows. Dreams are reportedly infrequent/vivid.  7-9  AM is the usual rise time. The patient wakes up spontaneously around 4-5 AM He reports not feeling refreshed or restored in AM. Naps are taken infrequently, more often since his wife passed.     Review of Systems: Out of a complete 14 system review, the patient complains of only the following symptoms, and all other reviewed systems are negative.:  Fatigue, sleepiness , snoring, fragmented sleep, Insomnia -  Actively grieving- lost his spouse in 08/26/2020.   Macular degeneration affecting central vision ., right over left eye.    How likely are you to doze in the following situations: 0 = not likely, 1 = slight chance, 2 = moderate chance, 3 = high chance   Sitting and Reading? Watching Television? Sitting inactive in a public place (theater or meeting)? As a passenger in a car for an hour without a break? Lying down in the afternoon when circumstances permit? Sitting and talking to someone? Sitting quietly after lunch without alcohol? In a car, while stopped for a few minutes in traffic?   Total = 7-10 / 24 points   FSS endorsed at  28/ 63 points.   Social History   Socioeconomic History   Marital status: Widowed after 68 years of marriage     Spouse name: Angelita Ingles, died 08-26-20.    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 [name]   Complication of anesthesia    post op acute urinary retention   DDD (degenerative disc disease)    Fatigue    pt evalulation by cardiology--- dr Fransico Michael, note 05-21-2019 in epic,(non cardiac)  normal echo and event monitor showed extra beats of atrial tachycardia , no atrial fib, and rare ecotpy   Full dentures    GERD (gastroesophageal reflux disease)    History of basal cell carcinoma (BCC) excision    12-25-2018 [facility]'s reconstruction left lower eyelid   History of external beam radiation therapy 2002   prostate cancer  and boost with radioative prostate seed implants   History of prostate cancer DX  2002   S/P EXTERNAL RADIATION/ RADIOACTIVE SEED IMPLANTS  2003   History of squamous cell carcinoma excision    2012--  RIGHT LOWER EXTREM;  2019 LEFT LOWER EXTREMITIY   HTN (hypertension)     followed by pcp   Hyperlipidemia    Hypothyroidism    followed by pcp--- acquired atrophy due to radiation;  previously seen by endocrinologist-  dr Chalmers Cater--- per pt takes his own thyroid med. 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  (09-06-2019  per pt no longer prednisone last taken one month ago 07/ 2021, weaned off by pcp per pt request)   Left renal artery stenosis [facility])    nephrologist--- dr [name]--- with aneurysm proximal    Lymphedema of left leg    followed by [facility]---- pt stated uses compression hose   OSA (obstructive sleep apnea) 09-06-2019  per pt lasted used cpap approx. 2019, stated felt he no longer needed    pt retested 11-13-2016 at [facility] w/ dr ather-- moderate to severe OSA , AHI 16.4/h/  03-24-2017    Pulmonary nodule followed by [facility]   per CT 06-11-2019  in [place] in epic done at [place],  LUL nodule , not mets   Renal cyst, right    Restless legs syndrome (RLS)    S/P radiation therapy     for tonsillar cancer (head and neck) completed 01-26-2012 at [facility]);   and XRT for right lower leg sarcoma --- completed 06-06-2017   Saliva decreased    Sarcoma of lower extremity, right ([place]) oncolgoist-  dr Juluis Rainier ([facility]) & Dr [name] ([facility])   dx 02-14-2017 w/ needle core bx;  high grade pleomorphic spindle cell sarcoma , grade 2 (cT2N0M0); 03-15-2017  radical resection sarcoma tumor right lower leg  and completed radiation 06-06-2017   Self-catheterizes urinary bladder    QID   AND   PRN   Tonsillar cancer ([place]) 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+) (Stage IVA)---- dx oct 2013  ----s/p left tonsillectomy and concurent chemo and radiation/  ended 01-26-2012-- no surgical intervention---  residuals ( dry mouth, decreased saliva)    oncologist  at [place]--  dr brizel--  HX OF -- NO RECURRENCE   Urethral false passage 07/2019   Urethral stricture urologist--- dr Jeffie Pollock   chronic---  post urethral dilation's   Urinary retention with incomplete bladder emptying    Vitamin B 12 deficiency 01/28/2011   Vitamin D deficiency     Past Surgical History:  Procedure Laterality Date   BALLOON DILATION N/A 11/10/2014   Procedure: CYSTO BALLOON DILATION AND RETROGRADE URETHROGRAM ;  Surgeon: Bjorn Loser, MD;  Location: [facility];  Service: Urology;  Laterality: N/A;   CATARACT EXTRACTION W/ INTRAOCULAR LENS  IMPLANT, BILATERAL  2012  approx   CYSTO/ BALLOON DILATION OF  URETHRAL STRICTURE  12-25-2010   CYSTOSCOPY WITH RETROGRADE URETHROGRAM N/A 03/30/2016   Procedure: CYSTOSCOPY WITH RETROGRADE URETHROGRAM AND BALLOON DILATION with cystogram;  Surgeon: Bjorn Loser, MD;  Location[facility];  Service: Urology;  Laterality: N/A;   CYSTOSCOPY WITH URETHRAL DILATATION  05/31/2011   Procedure: CYSTOSCOPY WITH URETHRAL DILATATION;  Surgeon: Reece Packer, MD;  Location: [place];  Service: Urology;  Laterality: N/A;  BALLOON DILATION   CYSTOSCOPY WITH URETHRAL DILATATION N/A 09/13/2016   Procedure: CYSTOSCOPY WITH URETHRAL BALLOON DILATATION;  Surgeon: Bjorn Loser, MD;  Location: [place];  Service: Urology;  Laterality: N/A;   CYSTOSCOPY WITH URETHRAL DILATATION N/A 03/30/2017   Procedure: CYSTOSCOPY WITH BALLOON URETHRAL DILATATION;  Surgeon: Bjorn Loser, MD;  Location: [place];  Service: Urology;  Laterality: N/A;   CYSTOSCOPY WITH URETHRAL DILATATION N/A 09/12/2019   Procedure: CYSTOSCOPY WITH URETHRAL DILATATION;  Surgeon: Irine Seal, MD;  Location: [facility];  Service: Urology;  Laterality: N/A;   CYSTOSCOPY/RETROGRADE/URETEROSCOPY N/A 05/22/2012   Procedure: CYSTOSCOPY BALLOON DILATION RETROGRADE URETEROGRAM ;   Surgeon: Reece Packer, MD;  Location: [place];  Service: Urology;  Laterality: N/A;   CYTSO/  DILATATION URETHRAL STRICTURE/  BX PROSTATIC URETHRA/  REMOVAL FOREIGN BODIES  06-25-2010   [facility]   ECTROPION REPAIR Left 06/18/2019   lower eyelid   INGUINAL HERNIA REPAIR Right 2000   MOHS SURGERY  01/ 2019    [facility]   left lower leg for [facility]   MOHS SURGERY  12/25/2018   for [facility] of left lower eyelid;  the second stage Hughs flap release 01-09-2019   RADICAL RESECTION OF SARCOMA TUMOR   03-15-2017   [facility]   RIGHT LOWER LEG , CALF AREA   RADIOACTIVE SEED IMPLANTS, PROSTATE  JAN  2003   SKIN LESION EXCISION  07/2012   MOST  right shoulder   TONSILLECTOMY Left 11-10-2011  @[facility]    tonsillar cancer   TRANSURETHRAL INCISION OF BLADDER NECK N/A 09/12/2019   Procedure: TRANSURETHRAL INCISION OF BLADDER NECK;  Surgeon: Irine Seal, MD;  Location: [place];  Service: Urology;  Laterality: N/A;     Current Outpatient Medications on File Prior to Visit  Medication Sig Dispense Refill   buPROPion (WELLBUTRIN) 100 MG tablet Take 1 tablet (100 mg total) by mouth 3 (three) times daily. (Patient taking differently: Take 100 mg by mouth 2 (two) times daily.) 180 tablet 1   Cholecalciferol (VITAMIN D) 50 MCG (2000 UT) CAPS Take by mouth every other day.     Cranberry-Vitamin C (AZO CRANBERRY URINARY TRACT PO) Take by mouth.     cyanocobalamin (,VITAMIN B-12,) 1000 MCG/ML injection Inject 1 mL (1,000 mcg total) into the muscle every 14 (fourteen) days. 30 mL 3  Garlic (GARLIQUE PO) Take by mouth every other day.     lisinopril (ZESTRIL) 10 MG tablet Take 1 tablet by mouth once daily 90 tablet 1   RESVERATROL PO Take 100 mg by mouth daily.      SYRINGE-NEEDLE, DISP, 3 ML (BD ECLIPSE SYRINGE/NEEDLE) 25G X 5/8" 3 ML MISC One injection every 14 days 24 each 0   thyroid (ARMOUR) 90 MG tablet Take 90 mg by mouth daily.     TURMERIC CURCUMIN PO Take 2 tablets by mouth daily.       UNABLE TO FIND [place] Bone take once daily     UNABLE TO FIND [place] take 4 tablets once daily     UNABLE TO FIND Comprehensive immune support take 2 tablets once daily     UNABLE TO FIND Modified citrus pectin take 4 tablets once daily     UNABLE TO FIND Celery seed take 2 tablets by mouth once daily     UNABLE TO FIND Maxivision multiple vitamin for eyes     No current facility-administered medications on file prior to visit.    Allergies  Allergen Reactions   Cefixime Rash   Contrast Media [Iodinated Contrast Media] Other (See Comments)    Avoid due to renal disease   Macrobid [Nitrofurantoin Macrocrystal] Swelling   Nsaids Other (See Comments)    Has been told no NSAIDs, Ibuprofen Renal insufficiency hx.    Cephalosporins Rash    Physical exam:  Today's Vitals   01/25/21 1448  BP: 105/61  Pulse: 78  Weight: 166 lb (75.3 kg)  Height: 5\' 11"  (1.803 m)   Body mass index is 23.15 kg/m.   Wt Readings from Last 3 Encounters:  01/25/21 166 lb (75.3 kg)  11/30/20 159 lb 3.2 oz (72.2 kg)  10/21/20 159 lb (72.1 kg)     Ht Readings from Last 3 Encounters:  01/25/21 5\' 11"  (1.803 m)  11/30/20 5\' 11"  (1.803 m)  10/21/20 5\' 11"  (1.803 m)      General: The patient is awake, alert and appears sad but not in acute distress. The patient is well groomed. Head: Normocephalic, atraumatic.  Neck is supple. Mallampati 2,  neck circumference:15 inches . Nasal airflow patent.  Retrognathia is not seen.  Dental status: deferred.   Cardiovascular:  Regular rate and cardiac rhythm by pulse,  without distended neck veins. Respiratory: Lungs are clear to auscultation.  Skin:  With  evidence of severe left ankle edema.  Neurologic exam : The patient is awake and alert, oriented to place and time.   Memory subjective described as intact.  Attention span & concentration ability appears normal.  Speech is fluent,  without  dysarthria, dysphonia or aphasia.  Mood and affect are  appropriate.   Cranial nerves: no loss of smell or taste reported  Pupils are not equal  in size, and slowly  reactive to light. Funduscopic exam deferred. .  Extraocular movements in vertical and horizontal planes were intact and without nystagmus. No Diplopia. Visual fields by finger perimetry are limited on the right  Hearing was intact to soft voice and finger rubbing.     Facial sensation intact to fine touch.  Facial motor strength is symmetric and tongue and uvula move midline.  Neck ROM : rotation, tilt and flexion extension were normal for age and shoulder shrug was symmetrical.    Motor exam:  Symmetric bulk, tone and ROM.   Normal tone without cog -wheeling, symmetric grip strength .  Sensory:  Fine touch, pinprick and vibration were tested  and  normal, except left ankle.   Proprioception tested in the upper extremities was normal.   Coordination: Rapid alternating movements in the fingers/hands were of normal speed.  The Finger-to-nose maneuver was intact without evidence of ataxia, dysmetria or tremor.   Gait and station: Patient could rise unassisted from a seated position, walked without assistive device.  Stance is of normal width/ base ,  Gait affected by vision impairment.  Deep tendon reflexes: in the  upper and lower extremities are symmetric and intact.  Babinski response was deferred       After spending a total time of  50  minutes face to face and additional time for physical and neurologic examination, review of laboratory studies,  personal review of imaging studies, reports and results of other testing and review of referral information / records as far as provided in visit, I have established the following assessments:  1) this is chronic insomnia, further affected by Grief. Geriatric depression score was 7 points, may be temporarily.   2) he has early morning arousals, often a sign of depression, serotonin or melatonin deficient.  3) not having been  retested for apnea in a decade.    My Plan is to proceed with:  We discussed sleep hygiene- no screen light in bed!    1)HST, lets see how much sleep there may be. Is there any OSA ?  His fit bit shows a lot of sleep, there may be a imsperception of sleep.  2) melatonin 5 mg or less at night, he feels this has helped.  3) Wellbutrin is a good medication for the morning hours.   I would like to thank Virgie Dad, MD and Royal Hawthorn, [place] [place],  [place] [address] for allowing me to meet with and to take care of this pleasant patient.   In short, ANDRIAN SABALA is presenting with early morning arousals. I plan to follow up either personally or through our NP within 3-4 month.   I was not aware of the patient's very recent HST, and would not have accepted him as a patient if his referral was stating this correctly.   Dentist Dr. Berneta Sages, [phone], on [address]  , he did the HST.   CC: I will share my notes with PCP.  Electronically signed by: Larey Seat, MD 01/25/2021 3:12 PM  [facility] [employer] [facility] Board certified by The [employer] of Sleep Medicine and Diplomate of the [employer] of Sleep Medicine. Board certified In Neurology through the [place], Fellow of the [employer] of Neurology. Medical Director of [employer].

## 2021-01-28 ENCOUNTER — Other Ambulatory Visit: Payer: Self-pay | Admitting: Internal Medicine

## 2021-01-28 DIAGNOSIS — F3341 Major depressive disorder, recurrent, in partial remission: Secondary | ICD-10-CM

## 2021-02-03 DIAGNOSIS — R339 Retention of urine, unspecified: Secondary | ICD-10-CM | POA: Diagnosis not present

## 2021-02-03 DIAGNOSIS — L570 Actinic keratosis: Secondary | ICD-10-CM | POA: Diagnosis not present

## 2021-02-03 DIAGNOSIS — I872 Venous insufficiency (chronic) (peripheral): Secondary | ICD-10-CM | POA: Diagnosis not present

## 2021-02-03 DIAGNOSIS — Z85820 Personal history of malignant melanoma of skin: Secondary | ICD-10-CM | POA: Diagnosis not present

## 2021-02-03 DIAGNOSIS — L853 Xerosis cutis: Secondary | ICD-10-CM | POA: Diagnosis not present

## 2021-02-03 DIAGNOSIS — L821 Other seborrheic keratosis: Secondary | ICD-10-CM | POA: Diagnosis not present

## 2021-02-03 DIAGNOSIS — N3020 Other chronic cystitis without hematuria: Secondary | ICD-10-CM | POA: Diagnosis not present

## 2021-02-03 DIAGNOSIS — N35012 Post-traumatic membranous urethral stricture: Secondary | ICD-10-CM | POA: Diagnosis not present

## 2021-02-15 ENCOUNTER — Ambulatory Visit (INDEPENDENT_AMBULATORY_CARE_PROVIDER_SITE_OTHER): Payer: Medicare Other | Admitting: Neurology

## 2021-02-15 DIAGNOSIS — F4321 Adjustment disorder with depressed mood: Secondary | ICD-10-CM

## 2021-02-15 DIAGNOSIS — F5102 Adjustment insomnia: Secondary | ICD-10-CM

## 2021-02-15 DIAGNOSIS — Z8669 Personal history of other diseases of the nervous system and sense organs: Secondary | ICD-10-CM

## 2021-02-15 DIAGNOSIS — G4733 Obstructive sleep apnea (adult) (pediatric): Secondary | ICD-10-CM

## 2021-02-17 NOTE — Progress Notes (Signed)
[facility] [place] TEST REPORT ( by [facility] PAT)   STUDY DATE:  02-16-2021   ORDERING CLINICIAN: Larey Seat, MD  REFERRING CLINICIAN:    CLINICAL INFORMATION/HISTORY: Philip [name] is a 88 y.o. Caucasian male patient who was seen  as new patient referral on 01/25/2021 from Lyndel Safe, MD. and Royal Hawthorn, NP.   Chief concern according to patient :  here after referral for insomnia. Pt was first dx with OSA at [facility] in 2010. Used CPAP for a whole year and stopped after noticing no difference.  Around 2016 he was again tested and dx with OSA, used CPAP again for another year with no difference. Pt reports always being able to sleep through the night before and during the time he used CPAP. He now has trouble staying asleep. Waking up around 2-4 AM . PS : I finally learned at the end of this visit that the patient had undergone a HST as well, not listed on referral, within the 3 year window-09-06-2019   The HST result is not stated in EPIC as his dentist was performing it.    Epworth sleepiness score: 10 /24. FSS at 28/ 63 points.  BMI: 23 kg/m  Neck Circumference: 15"   Sleep Summary:   Total Recording Time (hours, min):   9 h 6 m   Total Sleep Time (hours, min):   9 h 20 m             Percent REM (%): 21.6%                                       Respiratory Indices:   Calculated pAHI (per hour):  26.7/h                         REM pAHI:  35.8/h                                              NREM pAHI:    24.2/h                        Positional AHI: supine 33.8/h verus non supine at 19.4/h (left side only).                                                   Oxygen Saturation Statistics:    O2 Saturation Range (%): 65 -98%                                      O2 Saturation (minutes) <89%:   25 minutes        Pulse Rate Statistics:    Pulse Range:  48-93 bpm               IMPRESSION:  This HST confirms the presence of moderate- severe REM sleep  accentuated sleep apnea with hypoxia and  supine sleep accentuation. Intermittent bradycardia was present.    RECOMMENDATION:  1)This  apnea is too severe to remain untreated .  2)The Supine sleep position should be avoided.  3) I recommend yet another try at PAP therapy, after an in person fitting for the best interface. I like to order an attended PAP study which will allow to change to BiPAP if needed.  PS : Should PAP therapy be out of the question for [name], I will consider alternatives such as Inspire or dental device, with the expectation that these treatments may not alleviate all apnea but reduce the AHI to a milder level.     INTERPRETING PHYSICIAN:   Larey Seat, MD   Medical Director of [facility] at [facility].

## 2021-03-03 DIAGNOSIS — Z85820 Personal history of malignant melanoma of skin: Secondary | ICD-10-CM | POA: Diagnosis not present

## 2021-03-03 DIAGNOSIS — I872 Venous insufficiency (chronic) (peripheral): Secondary | ICD-10-CM | POA: Diagnosis not present

## 2021-03-03 DIAGNOSIS — L821 Other seborrheic keratosis: Secondary | ICD-10-CM | POA: Diagnosis not present

## 2021-03-03 DIAGNOSIS — L820 Inflamed seborrheic keratosis: Secondary | ICD-10-CM | POA: Diagnosis not present

## 2021-03-03 DIAGNOSIS — L570 Actinic keratosis: Secondary | ICD-10-CM | POA: Diagnosis not present

## 2021-03-03 DIAGNOSIS — L814 Other melanin hyperpigmentation: Secondary | ICD-10-CM | POA: Diagnosis not present

## 2021-03-03 DIAGNOSIS — L853 Xerosis cutis: Secondary | ICD-10-CM | POA: Diagnosis not present

## 2021-03-11 DIAGNOSIS — G4733 Obstructive sleep apnea (adult) (pediatric): Secondary | ICD-10-CM | POA: Insufficient documentation

## 2021-03-11 NOTE — Addendum Note (Signed)
Addended by: Larey Seat on: 03/11/2021 06:53 PM   Modules accepted: Orders

## 2021-03-11 NOTE — Progress Notes (Signed)
IMPRESSION:  This HST confirms the presence of moderate- severe REM sleep accentuated sleep apnea with hypoxia and  supine sleep accentuation. Intermittent bradycardia was present.   RECOMMENDATION:  1)This apnea is too severe to remain untreated .  2)The Supine sleep position should be avoided.  3) I recommend yet another try at PAP therapy, after an in person fitting for the best interface. I like to order an attended PAP study which will allow to change to BiPAP if needed.  PS : Should PAP therapy be out of the question for Philip [name], I will consider alternatives such as Inspire or dental device, with the expectation that these treatments may not alleviate REM apnea or hypoxia, but may reduce the AHI to a milder level.   Please let me know what the patient prefers.    INTERPRETING PHYSICIAN:   Larey Seat, MD   Medical Director of [facility] at [facility].

## 2021-03-11 NOTE — Procedures (Signed)
[facility] TEST REPORT ( by Watch PAT)   STUDY DATE:  02-16-2021   ORDERING CLINICIAN: Larey Seat, MD  REFERRING CLINICIAN:    CLINICAL INFORMATION/HISTORY: Philip [name] is a 88 y.o. Caucasian male patient who was seen  as new patient referral on 01/25/2021 from Lyndel Safe, MD. and Royal Hawthorn, NP.   Chief concern according to patient :  here after referral for insomnia. Pt was first dx with OSA at [facility] in 2010. Used CPAP for a whole year and stopped after noticing no difference.  Around 2016 he was again tested and dx with OSA, used CPAP again for another year with no difference. Pt reports always being able to sleep through the night before and during the time he used CPAP. He now has trouble staying asleep. Waking up around 2-4 AM . PS : I finally learned at the end of this visit that the patient had undergone a HST as well, not listed on referral, within the 3 year window-09-06-2019   The HST result is not stated in EPIC as his dentist was performing it.    Epworth sleepiness score: 10 /24. FSS at 28/ 63 points.  BMI: 23 kg/m  Neck Circumference: 15"   Sleep Summary:   Total Recording Time (hours, min):   9 h 102 m   Total Sleep Time (hours, min):   9 h 20 m             Percent REM (%): 21.6%                                       Respiratory Indices:   Calculated pAHI (per hour):  26.7/h                         REM pAHI:  35.8/h                                              NREM pAHI:    24.2/h                        Positional AHI: supine 33.8/h verus non supine at 19.4/h (left side only).                                                   Oxygen Saturation Statistics:    O2 Saturation Range (%): 65 -98%                                      O2 Saturation (minutes) <89%:   25 minutes        Pulse Rate Statistics:    Pulse Range:  48-93 bpm               IMPRESSION:  This HST confirms the presence of moderate- severe REM sleep accentuated  sleep apnea with hypoxia and  supine sleep accentuation. Intermittent bradycardia was present.    RECOMMENDATION:  1)This apnea is  too severe to remain untreated .  2)The Supine sleep position should be avoided.  3) I recommend yet another try at PAP therapy, after an in person fitting for the best interface. I like to order an attended PAP study which will allow to change to BiPAP if needed.  PS : Should PAP therapy be out of the question for [name], I will consider alternatives such as Inspire or dental device, with the expectation that these treatments may not alleviate all apnea but reduce the AHI to a milder level.     INTERPRETING PHYSICIAN:   Larey Seat, MD   Medical Director of [facility] at [facility].

## 2021-03-12 DIAGNOSIS — I701 Atherosclerosis of renal artery: Secondary | ICD-10-CM | POA: Diagnosis not present

## 2021-03-12 DIAGNOSIS — I129 Hypertensive chronic kidney disease with stage 1 through stage 4 chronic kidney disease, or unspecified chronic kidney disease: Secondary | ICD-10-CM | POA: Diagnosis not present

## 2021-03-12 DIAGNOSIS — N390 Urinary tract infection, site not specified: Secondary | ICD-10-CM | POA: Diagnosis not present

## 2021-03-12 DIAGNOSIS — F32A Depression, unspecified: Secondary | ICD-10-CM | POA: Diagnosis not present

## 2021-03-12 DIAGNOSIS — N1831 Chronic kidney disease, stage 3a: Secondary | ICD-10-CM | POA: Diagnosis not present

## 2021-03-12 DIAGNOSIS — M316 Other giant cell arteritis: Secondary | ICD-10-CM | POA: Diagnosis not present

## 2021-03-12 DIAGNOSIS — C499 Malignant neoplasm of connective and soft tissue, unspecified: Secondary | ICD-10-CM | POA: Diagnosis not present

## 2021-03-16 ENCOUNTER — Other Ambulatory Visit: Payer: Self-pay | Admitting: Nephrology

## 2021-03-16 ENCOUNTER — Telehealth: Payer: Self-pay | Admitting: Neurology

## 2021-03-16 DIAGNOSIS — I10 Essential (primary) hypertension: Secondary | ICD-10-CM

## 2021-03-16 DIAGNOSIS — I701 Atherosclerosis of renal artery: Secondary | ICD-10-CM

## 2021-03-16 DIAGNOSIS — N1831 Chronic kidney disease, stage 3a: Secondary | ICD-10-CM

## 2021-03-16 NOTE — Telephone Encounter (Signed)
Called the patient and reviewed the sleep study results with the patient in detail. I reviewed the recommendations that are made by Dr Brett Fairy. He has tried CPAP previously before in 2010 and again in 2016 and it didn't help before he states. Upon discussing further, I was able to review his old data from 2019 which did show that there was treatment with cpap.  I explained to the patient.  Advised the patient that there are alternative options to inspire device.  Patient states he does not want to do an inspire and has already tried a dental device.  Patient is hesitant to try CPAP for a third time.  After a 25-minute discussion patient agreed to try CPAP again.  Patient previously had a CPAP and will set up December 2018.  Advised the patient that he may not be quite eligible for a new machine.  Asked the patient if he still has the CPAP and he is unsure if he turned it in.  We will order through [employer].  An appointment was made in the first follow-up visit.  Patient verbalized understanding.

## 2021-03-17 ENCOUNTER — Telehealth: Payer: Self-pay

## 2021-03-17 ENCOUNTER — Encounter: Payer: Self-pay | Admitting: *Deleted

## 2021-03-17 LAB — BASIC METABOLIC PANEL
BUN: 33 — AB (ref 4–21)
CO2: 27 — AB (ref 13–22)
Chloride: 103 (ref 99–108)
Creatinine: 1.5 — AB (ref 0.6–1.3)
Glucose: 95
Potassium: 5 (ref 3.4–5.3)
Sodium: 140 (ref 137–147)

## 2021-03-17 LAB — COMPREHENSIVE METABOLIC PANEL
Albumin: 4 (ref 3.5–5.0)
Calcium: 8.9 (ref 8.7–10.7)

## 2021-03-17 NOTE — Telephone Encounter (Signed)
LVM for pt to call me back to schedule sleep study  

## 2021-03-18 ENCOUNTER — Telehealth: Payer: Self-pay

## 2021-03-18 NOTE — Telephone Encounter (Signed)
Patient is still grieving the loss of his wife but will call back to schedule his sleep study when he is feeling better ?

## 2021-03-22 ENCOUNTER — Encounter: Payer: Self-pay | Admitting: Neurology

## 2021-03-22 NOTE — Telephone Encounter (Addendum)
Pt is not eligible for a new machine until 12/2021. It would be best to have the patient scheduled for a follow up visit closer to July time frame and then complete a split night titration after that apt to have him completed in time to order machine for him by December.  ?If pt calls back to schedule titration study, please relay it is best to hold off (insurance is going to basically require it) (I am sending a mychart message as well to the pt) ?

## 2021-03-23 DIAGNOSIS — D649 Anemia, unspecified: Secondary | ICD-10-CM | POA: Diagnosis not present

## 2021-03-23 DIAGNOSIS — I10 Essential (primary) hypertension: Secondary | ICD-10-CM | POA: Diagnosis not present

## 2021-03-23 DIAGNOSIS — N182 Chronic kidney disease, stage 2 (mild): Secondary | ICD-10-CM | POA: Diagnosis not present

## 2021-03-23 LAB — CBC AND DIFFERENTIAL
HCT: 39 — AB (ref 41–53)
Hemoglobin: 13.4 — AB (ref 13.5–17.5)
Platelets: 215 10*3/uL (ref 150–400)
WBC: 4.1

## 2021-03-23 LAB — COMPREHENSIVE METABOLIC PANEL: Calcium: 9 (ref 8.7–10.7)

## 2021-03-23 LAB — CBC: RBC: 4.08 (ref 3.87–5.11)

## 2021-03-23 LAB — BASIC METABOLIC PANEL WITH GFR
BUN: 33 — AB (ref 4–21)
CO2: 26 — AB (ref 13–22)
Chloride: 104 (ref 99–108)
Creatinine: 1.4 — AB (ref 0.6–1.3)
Glucose: 91
Potassium: 4.8 meq/L (ref 3.5–5.1)
Sodium: 140 (ref 137–147)

## 2021-03-30 DIAGNOSIS — M67911 Unspecified disorder of synovium and tendon, right shoulder: Secondary | ICD-10-CM | POA: Diagnosis not present

## 2021-03-30 DIAGNOSIS — M19011 Primary osteoarthritis, right shoulder: Secondary | ICD-10-CM | POA: Diagnosis not present

## 2021-03-31 ENCOUNTER — Encounter: Payer: Self-pay | Admitting: Internal Medicine

## 2021-03-31 ENCOUNTER — Other Ambulatory Visit: Payer: Self-pay

## 2021-03-31 ENCOUNTER — Non-Acute Institutional Stay: Payer: Medicare Other | Admitting: Internal Medicine

## 2021-03-31 VITALS — BP 132/80 | HR 82 | Temp 98.1°F | Ht 71.0 in | Wt 166.2 lb

## 2021-03-31 DIAGNOSIS — E782 Mixed hyperlipidemia: Secondary | ICD-10-CM

## 2021-03-31 DIAGNOSIS — F5101 Primary insomnia: Secondary | ICD-10-CM | POA: Diagnosis not present

## 2021-03-31 DIAGNOSIS — G4733 Obstructive sleep apnea (adult) (pediatric): Secondary | ICD-10-CM

## 2021-03-31 DIAGNOSIS — F331 Major depressive disorder, recurrent, moderate: Secondary | ICD-10-CM

## 2021-03-31 DIAGNOSIS — L570 Actinic keratosis: Secondary | ICD-10-CM | POA: Diagnosis not present

## 2021-03-31 DIAGNOSIS — E538 Deficiency of other specified B group vitamins: Secondary | ICD-10-CM | POA: Diagnosis not present

## 2021-03-31 DIAGNOSIS — N1832 Chronic kidney disease, stage 3b: Secondary | ICD-10-CM | POA: Diagnosis not present

## 2021-03-31 DIAGNOSIS — Z85820 Personal history of malignant melanoma of skin: Secondary | ICD-10-CM | POA: Diagnosis not present

## 2021-03-31 DIAGNOSIS — E034 Atrophy of thyroid (acquired): Secondary | ICD-10-CM | POA: Diagnosis not present

## 2021-03-31 DIAGNOSIS — F4310 Post-traumatic stress disorder, unspecified: Secondary | ICD-10-CM | POA: Diagnosis not present

## 2021-03-31 DIAGNOSIS — L905 Scar conditions and fibrosis of skin: Secondary | ICD-10-CM | POA: Diagnosis not present

## 2021-03-31 DIAGNOSIS — I872 Venous insufficiency (chronic) (peripheral): Secondary | ICD-10-CM | POA: Diagnosis not present

## 2021-03-31 NOTE — Progress Notes (Signed)
? ?Location:  [facility] ?  ?Place of Service:  Clinic (12) ? ?Provider:  ? ?Code Status:  ?Goals of Care:  ?Advanced Directives 03/31/2021  ?Does Patient Have a Medical Advance Directive? Yes  ?Type of Advance Directive Out of facility DNR (pink MOST or yellow form)  ?Does patient want to make changes to medical advance directive? No - Patient declined  ?Copy of [place] in Chart? -  ?Would patient like information on creating a medical advance directive? -  ?Pre-existing out of facility DNR order (yellow form or pink MOST form) Yellow form placed in chart (order not valid for inpatient use);Pink MOST form placed in chart (order not valid for inpatient use)  ? ? ? ?Chief Complaint  ?Patient presents with  ? Medical Management of Chronic Issues  ?  Patient returns to the clinic for 4 month follow up.   ? Quality Metric Gaps  ?  Verified [facility] and [facility] patient is due for Zoster Vaccines- Shingrix (1 of 2), TETANUS/TDAP (Every 10 Years) and 2022 INFLUENZA VACCINE ? ?  ? ? ?HPI: Patient is a 88 y.o. male seen today for medical management of chronic diseases.   ? ?Has Past medical  history of sarcoma of Right lower extremity ?Follows closely with oncology at [facility] for CT scan of his chest for lung nodules. ?Has been in remission. Recent Imaging has not shown any issues ?History of prostate cancer. ?History of tonsillar cancer ?Macular degeneration follows with ophthalmologist ?Aortitis syndrome.  Follows with Dr. Dossie Der who sometimes has to use prednisone as needed ?B12 deficiency takes injection self administer ?Hypothyroid follows with Dr. Chalmers Cater has not seen her recently ? ?Acute issue ?Sleep Apnea ?Per Dr Dohmeier His Apnea is severe and he need to use CPAP ?Patient is going to find his Old CPAP machine and get it adjusted ? ?Insomnia ?Has failed trazodone and Remeron ?Now doing Melatonin But still has Insomnia ?Depression and Anxiety ?Started crying again about his wife who  died last year ?On Wellbutrin and Xanax ?Also Talking to councilor with hospice ? ? ?Past Medical History:  ?Diagnosis Date  ? Age-related macular degeneration, dry, both eyes   ? Aortitis syndrome ([place]) rheumotologist-  dr syed  ? IgG4 syndrome--  (effects abdomine) ,  previously long term use prednisone last taken 07/ 2021  ? Basal cell carcinoma (BCC) of eye   ? CKD (chronic kidney disease), stage III ([place])   ? nephrologist-- dr Johnette Abraham. [name]  ? Complication of anesthesia   ? post op acute urinary retention  ? DDD (degenerative disc disease)   ? Fatigue   ? pt evalulation by cardiology--- dr Fransico Michael, note 05-21-2019 in epic,(non cardiac)  normal echo and event monitor showed extra beats of atrial tachycardia , no atrial fib, and rare ecotpy  ? Full dentures   ? GERD (gastroesophageal reflux disease)   ? History of basal cell carcinoma (BCC) excision   ? 12-25-2018 MOH's reconstruction left lower eyelid  ? History of external beam radiation therapy 2002  ? prostate cancer  and boost with radioative prostate seed implants  ? History of prostate cancer DX  2002  ? S/P EXTERNAL RADIATION/ RADIOACTIVE SEED IMPLANTS  2003  ? History of squamous cell carcinoma excision   ? 2012--  RIGHT LOWER EXTREM;  2019 LEFT LOWER EXTREMITIY  ? HTN (hypertension)   ? followed by pcp  ? Hyperlipidemia   ? Hypothyroidism   ? followed by pcp--- acquired atrophy due  to radiation;  previously seen by endocrinologist-  dr balan--- per pt takes his own thyroid med. called NP Thyroid 90 mg daily, does not take synthroid  ? Large vessel vasculitis ([place])   ? rheumotologist-  dr syed --- IgG4 related aoritis involving renal arteries ,  pt treated with predisone.  (09-06-2019  per pt no longer prednisone last taken one month ago 07/ 2021, weaned off by pcp per pt request)  ? Left renal artery stenosis (HCC)   ? nephrologist--- dr [name]--- with aneurysm proximal   ? Lymphedema of left leg   ? followed by [facility]---- pt stated uses  compression hose  ? OSA (obstructive sleep apnea) 09-06-2019  per pt lasted used cpap approx. 2019, stated felt he no longer needed   ? pt retested 11-13-2016 at [facility] w/ dr ather-- moderate to severe OSA , AHI 16.4/h/  03-24-2017   ? Pulmonary nodule [facility] by [facility]  ? per CT 06-11-2019  in [place] in epic done at [place],  LUL nodule , not mets  ? Renal cyst, right   ? Restless legs syndrome (RLS)   ? S/P radiation therapy   ?  for tonsillar cancer (head and neck) completed 01-26-2012 at [facility]);   and XRT for right lower leg sarcoma --- completed 06-06-2017  ? Saliva decreased   ? Sarcoma of lower extremity, right ([place]) oncolgoist-  dr Juluis Rainier ([facility]) & Dr [name] ([facility])  ? dx 02-14-2017 w/ needle core bx;  high grade pleomorphic spindle cell sarcoma , grade 2 (cT2N0M0); 03-15-2017  radical resection sarcoma tumor right lower leg  and completed radiation 06-06-2017  ? Self-catheterizes urinary bladder   ? QID   AND   PRN  ? Tonsillar cancer ([place]) 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+) (Stage IVA)---- dx oct 2013  ----s/p left tonsillectomy and concurent chemo and radiation/  ended 01-26-2012-- no surgical intervention---  residuals ( dry mouth, decreased saliva)  ? oncologist at [place]--  dr brizel--  HX OF -- NO RECURRENCE  ? Urethral false passage 07/2019  ? Urethral stricture urologist--- dr Jeffie Pollock  ? chronic---  post urethral dilation's  ? Urinary retention with incomplete bladder emptying   ? Vitamin B 12 deficiency 01/28/2011  ? Vitamin D deficiency   ? ? ?Past Surgical History:  ?Procedure Laterality Date  ? BALLOON DILATION N/A 11/10/2014  ? Procedure: CYSTO BALLOON DILATION AND RETROGRADE URETHROGRAM ;  Surgeon: Bjorn Loser, MD;  Location: [facility];  Service: Urology;  Laterality: N/A;  ? CATARACT EXTRACTION W/ INTRAOCULAR LENS  IMPLANT, BILATERAL  2012  approx  ? CYSTO/ BALLOON DILATION OF  URETHRAL  STRICTURE  12-25-2010  ? CYSTOSCOPY WITH RETROGRADE URETHROGRAM N/A 03/30/2016  ? Procedure: CYSTOSCOPY WITH RETROGRADE URETHROGRAM AND BALLOON DILATION with cystogram;  Surgeon: Bjorn Loser, MD;  Location: [facility];  Service: Urology;  Laterality: N/A;  ? CYSTOSCOPY WITH URETHRAL DILATATION  05/31/2011  ? Procedure: CYSTOSCOPY WITH URETHRAL DILATATION;  Surgeon: Reece Packer, MD;  Location: [facility];  Service: Urology;  Laterality: N/A;  BALLOON DILATION  ? CYSTOSCOPY WITH URETHRAL DILATATION N/A 09/13/2016  ? Procedure: CYSTOSCOPY WITH URETHRAL BALLOON DILATATION;  Surgeon: Bjorn Loser, MD;  Location: [facility];  Service: Urology;  Laterality: N/A;  ? CYSTOSCOPY WITH URETHRAL DILATATION N/A 03/30/2017  ? Procedure: CYSTOSCOPY WITH BALLOON URETHRAL DILATATION;  Surgeon: Bjorn Loser, MD;  Location:  [place];  Service: Urology;  Laterality: N/A;  ? CYSTOSCOPY WITH URETHRAL DILATATION N/A 09/12/2019  ? Procedure: CYSTOSCOPY WITH URETHRAL DILATATION;  Surgeon: Irine Seal, MD;  Location: [facility];  Service: Urology;  Laterality: N/A;  ? CYSTOSCOPY/RETROGRADE/URETEROSCOPY N/A 05/22/2012  ? Procedure: CYSTOSCOPY BALLOON DILATION RETROGRADE URETEROGRAM ;  Surgeon: Reece Packer, MD;  Location: [facility];  Service: Urology;  Laterality: N/A;  ? CYTSO/  DILATATION URETHRAL STRICTURE/  BX PROSTATIC URETHRA/  REMOVAL FOREIGN BODIES  06-25-2010   [facility]  ? ECTROPION REPAIR Left 06/18/2019  ? lower eyelid  ? INGUINAL HERNIA REPAIR Right 2000  ? MOHS SURGERY  01/ 2019    [facility]  ? left lower leg for SCC  ? MOHS SURGERY  12/25/2018  ? for [facility] of left lower eyelid;  the second stage Hughs flap release 01-09-2019  ? RADICAL RESECTION OF SARCOMA TUMOR   03-15-2017   [facility]  ? RIGHT LOWER LEG , CALF AREA  ? RADIOACTIVE SEED IMPLANTS, PROSTATE  JAN  2003  ? SKIN LESION EXCISION  07/2012  ? MOST  right shoulder  ?  TONSILLECTOMY Left 11-10-2011  '@[facility]'$   ? tonsillar cancer  ? TRANSURETHRAL INCISION OF BLADDER NECK N/A 09/12/2019  ? Procedure: TRANSURETHRAL INCISION OF BLADDER NECK;  Surgeon: Irine Seal, MD;  Location: [facility]

## 2021-04-02 ENCOUNTER — Other Ambulatory Visit: Payer: Medicare Other

## 2021-04-05 ENCOUNTER — Encounter (INDEPENDENT_AMBULATORY_CARE_PROVIDER_SITE_OTHER): Payer: Medicare Other | Admitting: Ophthalmology

## 2021-04-05 ENCOUNTER — Other Ambulatory Visit: Payer: Self-pay

## 2021-04-05 ENCOUNTER — Ambulatory Visit
Admission: RE | Admit: 2021-04-05 | Discharge: 2021-04-05 | Disposition: A | Discharge status: Home / Self Care | Payer: Medicare Other | Source: Ambulatory Visit | Attending: Nephrology | Admitting: Nephrology

## 2021-04-05 DIAGNOSIS — I129 Hypertensive chronic kidney disease with stage 1 through stage 4 chronic kidney disease, or unspecified chronic kidney disease: Secondary | ICD-10-CM | POA: Diagnosis not present

## 2021-04-05 DIAGNOSIS — I10 Essential (primary) hypertension: Secondary | ICD-10-CM

## 2021-04-05 DIAGNOSIS — I701 Atherosclerosis of renal artery: Secondary | ICD-10-CM

## 2021-04-05 DIAGNOSIS — N1831 Chronic kidney disease, stage 3a: Secondary | ICD-10-CM

## 2021-04-05 DIAGNOSIS — N281 Cyst of kidney, acquired: Secondary | ICD-10-CM | POA: Diagnosis not present

## 2021-04-20 ENCOUNTER — Other Ambulatory Visit: Payer: Self-pay | Admitting: Neurology

## 2021-04-20 DIAGNOSIS — Z8669 Personal history of other diseases of the nervous system and sense organs: Secondary | ICD-10-CM

## 2021-04-20 DIAGNOSIS — F5102 Adjustment insomnia: Secondary | ICD-10-CM

## 2021-04-20 NOTE — Telephone Encounter (Signed)
Pt ask if company need to reset the CPAP machine. Found the CPAP in the closet. Would like a call from the nurse to discuss what need to do. ?

## 2021-04-20 NOTE — Telephone Encounter (Signed)
I called the patient back. Advised at this point he would just need to restart using his machine. Advised I have sent a updated order to [employer] advising that he is ready to restart therapy. The patient should follow up with [employer] in regards to making sure the machine is working well and that the mask he has works well. Advised that I will cancel the may apt since not starting a new cpap. Informed him he would just need to make sure we schedule a follow up visit in the early fall time frame. Pt verbalized understanding. ?The number to [employer] was provided to the pt. Pt verbalized understanding. ?

## 2021-05-04 ENCOUNTER — Telehealth: Payer: Self-pay | Admitting: Neurology

## 2021-05-04 NOTE — Telephone Encounter (Signed)
Pt said, DME, [employer] request what pressure to set the machine. Pt would like a call from the nurse. ?

## 2021-05-04 NOTE — Telephone Encounter (Signed)
I have contacted [employer] and they now that the patient is just needing to restart therapy. He has an apt 4/26 for them to assess machine. Pt should just make sure he has what he needs to restart therapy. The machine is already set at a pressure. We would need to see that he is using that before any pressure changes would be needed.  ?

## 2021-05-04 NOTE — Telephone Encounter (Signed)
Pt is just restarting CPAP therapy. His machine is already set and there is no pressure changes. I have reached out to [employer] to have them verify if there is anything else needed from [place]. The patient just needs to start using the machine. Will wait to make sure [employer] doesn't need anything else from [place].  ? ?

## 2021-05-05 DIAGNOSIS — I872 Venous insufficiency (chronic) (peripheral): Secondary | ICD-10-CM | POA: Diagnosis not present

## 2021-05-05 DIAGNOSIS — Z85820 Personal history of malignant melanoma of skin: Secondary | ICD-10-CM | POA: Diagnosis not present

## 2021-05-05 DIAGNOSIS — L820 Inflamed seborrheic keratosis: Secondary | ICD-10-CM | POA: Diagnosis not present

## 2021-05-05 DIAGNOSIS — L570 Actinic keratosis: Secondary | ICD-10-CM | POA: Diagnosis not present

## 2021-05-06 ENCOUNTER — Ambulatory Visit (INDEPENDENT_AMBULATORY_CARE_PROVIDER_SITE_OTHER): Payer: Medicare Other | Admitting: Ophthalmology

## 2021-05-06 ENCOUNTER — Encounter (INDEPENDENT_AMBULATORY_CARE_PROVIDER_SITE_OTHER): Payer: Self-pay | Admitting: Ophthalmology

## 2021-05-06 DIAGNOSIS — H353123 Nonexudative age-related macular degeneration, left eye, advanced atrophic without subfoveal involvement: Secondary | ICD-10-CM

## 2021-05-06 DIAGNOSIS — D3132 Benign neoplasm of left choroid: Secondary | ICD-10-CM | POA: Diagnosis not present

## 2021-05-06 DIAGNOSIS — H353114 Nonexudative age-related macular degeneration, right eye, advanced atrophic with subfoveal involvement: Secondary | ICD-10-CM | POA: Diagnosis not present

## 2021-05-06 DIAGNOSIS — H353132 Nonexudative age-related macular degeneration, bilateral, intermediate dry stage: Secondary | ICD-10-CM | POA: Diagnosis not present

## 2021-05-06 DIAGNOSIS — I701 Atherosclerosis of renal artery: Secondary | ICD-10-CM

## 2021-05-06 NOTE — Progress Notes (Signed)
? ? ?05/06/2021 ? ?  ? ?CHIEF COMPLAINT ?Patient presents for  ?Chief Complaint  ?Patient presents with  ? Macular Degeneration  ? ? ? ? ?HISTORY OF PRESENT ILLNESS: ?Philip [name] is a 88 y.o. male who presents to the clinic today for:  ? ?HPI   ?4 mos FU Dilate OU, OCT, FP. ?Patient states vision is stable and unchanged since last visit. Denies any new floaters or FOL. ?Pt takes AREDS once a day. ?Last edited by Laurin Coder on 05/06/2021  1:35 PM.  ?  ? ? ?Referring physician: ?Virgie Dad, MD ?[address] ?[place],  [place] [address] ? ?HISTORICAL INFORMATION:  ? ?Selected notes from the [facility] ?  ?   ? ?CURRENT MEDICATIONS: ?No current outpatient medications on file. (Ophthalmic Drugs)  ? ?No current facility-administered medications for this visit. (Ophthalmic Drugs)  ? ?Current Outpatient Medications (Other)  ?Medication Sig  ? buPROPion (WELLBUTRIN) 100 MG tablet Take 1 tablet (100 mg total) by mouth 3 (three) times daily. (Patient taking differently: Take 100 mg by mouth 2 (two) times daily.)  ? Cholecalciferol (VITAMIN D) 50 MCG (2000 UT) CAPS Take by mouth every other day.  ? Cranberry-Vitamin C (AZO CRANBERRY URINARY TRACT PO) Take by mouth.  ? cyanocobalamin (,VITAMIN B-12,) 1000 MCG/ML injection Inject 1 mL (1,000 mcg total) into the muscle every 14 (fourteen) days.  ? Garlic (GARLIQUE PO) Take by mouth every other day.  ? lisinopril (ZESTRIL) 10 MG tablet Take 1 tablet by mouth once daily  ? RESVERATROL PO Take 100 mg by mouth daily.   ? SYRINGE-NEEDLE, DISP, 3 ML (BD ECLIPSE SYRINGE/NEEDLE) 25G X 5/8" 3 ML MISC One injection every 14 days  ? thyroid (ARMOUR) 90 MG tablet Take 90 mg by mouth daily.  ? TURMERIC CURCUMIN PO Take 2 tablets by mouth daily.   ? UNABLE TO FIND [place] take once daily  ? UNABLE TO FIND [place] take 4 tablets once daily  ? UNABLE TO FIND Comprehensive immune support take 2 tablets once daily  ? UNABLE TO FIND Modified citrus pectin take 4  tablets once daily  ? UNABLE TO FIND Celery seed take 2 tablets by mouth once daily  ? UNABLE TO FIND Maxivision multiple vitamin for eyes  ? ?No current facility-administered medications for this visit. (Other)  ? ? ? ? ?REVIEW OF SYSTEMS: ?ROS   ?Negative for: Constitutional, Gastrointestinal, Neurological, Skin, Genitourinary, Musculoskeletal, HENT, Endocrine, Cardiovascular, Eyes, Respiratory, Psychiatric, Allergic/Imm, Heme/Lymph ?Last edited by Hurman Horn, MD on 05/06/2021  3:10 PM.  ?  ? ? ? ?ALLERGIES ?Allergies  ?Allergen Reactions  ? Cefixime Rash  ? Contrast Media [Iodinated Contrast Media] Other (See Comments)  ?  Avoid due to renal disease  ? Macrobid [Nitrofurantoin Macrocrystal] Swelling  ? Nsaids Other (See Comments)  ?  Has been told no NSAIDs, Ibuprofen ?Renal insufficiency hx.   ? Cephalosporins Rash  ? ? ?PAST MEDICAL HISTORY ?Past Medical History:  ?Diagnosis Date  ? Age-related macular degeneration, dry, both eyes   ? Aortitis syndrome ([place]) rheumotologist-  dr syed  ? IgG4 syndrome--  (effects abdomine) ,  previously long term use prednisone last taken 07/ 2021  ? Basal cell carcinoma (BCC) of eye   ? CKD (chronic kidney disease), stage III ([place])   ? nephrologist-- dr Johnette Abraham. [name]  ? Complication of anesthesia   ? post op acute urinary retention  ? DDD (degenerative disc disease)   ? Fatigue   ?  pt evalulation by cardiology--- dr Fransico Michael, note 05-21-2019 in epic,(non cardiac)  normal echo and event monitor showed extra beats of atrial tachycardia , no atrial fib, and rare ecotpy  ? Full dentures   ? GERD (gastroesophageal reflux disease)   ? History of basal cell carcinoma (BCC) excision   ? 12-25-2018 MOH's reconstruction left lower eyelid  ? History of external beam radiation therapy 2002  ? prostate cancer  and boost with radioative prostate seed implants  ? History of prostate cancer DX  2002  ? S/P EXTERNAL RADIATION/ RADIOACTIVE SEED IMPLANTS  2003  ? History of squamous cell  carcinoma excision   ? 2012--  RIGHT LOWER EXTREM;  2019 LEFT LOWER EXTREMITIY  ? HTN (hypertension)   ? followed by pcp  ? Hyperlipidemia   ? Hypothyroidism   ? followed by pcp--- acquired atrophy due to radiation;  previously seen by endocrinologist-  dr balan--- per pt takes his own thyroid med. called NP Thyroid 90 mg daily, does not take synthroid  ? 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 rheumotologist-  dr syed --- IgG4 related aoritis involving renal arteries ,  pt treated with predisone.  (09-06-2019  per pt no longer prednisone last taken one month ago 07/ 2021, weaned off by pcp per pt request)  ? Left renal artery stenosis (HCC)   ? nephrologist--- dr [name]--- with aneurysm proximal   ? Lymphedema of left leg   ? followed by [facility]---- pt stated uses compression hose  ? OSA (obstructive sleep apnea) 09-06-2019  per pt lasted used cpap approx. 2019, stated felt he no longer needed   ? pt retested 11-13-2016 at [facility] w/ dr ather-- moderate to severe OSA , AHI 16.4/h/  03-24-2017   ? Pulmonary nodule [facility] by [facility]  ? per CT 06-11-2019  in [place] in epic done at [place],  LUL nodule , not mets  ? Renal cyst, right   ? Restless legs syndrome (RLS)   ? S/P radiation therapy   ?  for tonsillar cancer (head and neck) completed 01-26-2012 at [facility]);   and XRT for right lower leg sarcoma --- completed 06-06-2017  ? Saliva decreased   ? Sarcoma of lower extremity, right ([place]) oncolgoist-  dr Juluis Rainier ([facility]) & Dr [name] ([facility])  ? dx 02-14-2017 w/ needle core bx;  high grade pleomorphic spindle cell sarcoma , grade 2 (cT2N0M0); 03-15-2017  radical resection sarcoma tumor right lower leg  and completed radiation 06-06-2017  ? Self-catheterizes urinary bladder   ? QID   AND   PRN  ? Tonsillar cancer ([place]) 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+) (Stage IVA)---- dx oct 2013  ----s/p left tonsillectomy and concurent chemo and  radiation/  ended 01-26-2012-- no surgical intervention---  residuals ( dry mouth, decreased saliva)  ? oncologist at [place]--  dr brizel--  HX OF -- NO RECURRENCE  ? Urethral false passage 07/2019  ? Urethral stricture urologist--- dr Jeffie Pollock  ? chronic---  post urethral dilation's  ? Urinary retention with incomplete bladder emptying   ? Vitamin B 12 deficiency 01/28/2011  ? Vitamin D deficiency   ? ?Past Surgical History:  ?Procedure Laterality Date  ? BALLOON DILATION N/A 11/10/2014  ? Procedure: CYSTO BALLOON DILATION AND RETROGRADE URETHROGRAM ;  Surgeon: Bjorn Loser, MD;  [facility];  Service: Urology;  Laterality: N/A;  ? CATARACT EXTRACTION W/ INTRAOCULAR LENS  IMPLANT, BILATERAL  2012  approx  ? CYSTO/ BALLOON DILATION OF  URETHRAL STRICTURE  12-25-2010  ? CYSTOSCOPY WITH RETROGRADE URETHROGRAM N/A 03/30/2016  ? Procedure: CYSTOSCOPY WITH RETROGRADE URETHROGRAM AND BALLOON DILATION with cystogram;  Surgeon: Bjorn Loser, MD;  Location: [facility];  Service: Urology;  Laterality: N/A;  ? CYSTOSCOPY WITH URETHRAL DILATATION  05/31/2011  ? Procedure: CYSTOSCOPY WITH URETHRAL DILATATION;  Surgeon: Reece Packer, MD;  Location: [facility];  Service: Urology;  Laterality: N/A;  BALLOON DILATION  ? CYSTOSCOPY WITH URETHRAL DILATATION N/A 09/13/2016  ? Procedure: CYSTOSCOPY WITH URETHRAL BALLOON DILATATION;  Surgeon: Bjorn Loser, MD;  Location: [facility];  Service: Urology;  Laterality: N/A;  ? CYSTOSCOPY WITH URETHRAL DILATATION N/A 03/30/2017  ? Procedure: CYSTOSCOPY WITH BALLOON URETHRAL DILATATION;  Surgeon: Bjorn Loser, MD;  Location: [facility];  Service: Urology;  Laterality: N/A;  ? CYSTOSCOPY WITH URETHRAL DILATATION N/A 09/12/2019  ? Procedure: CYSTOSCOPY WITH URETHRAL DILATATION;  Surgeon: Irine Seal, MD;  Location: [facility];  Service: Urology;  Laterality: N/A;  ?  CYSTOSCOPY/RETROGRADE/URETEROSCOPY N/A 05/22/2012  ? Procedure: CYSTOSCOPY BALLOON DILATION RETROGRADE URETEROGRAM ;  Surgeon: Reece Packer, MD;  Location: [facility];  Service: Urology;  Laterality: N/A;

## 2021-05-06 NOTE — Assessment & Plan Note (Signed)
OD, geographic atrophy centrally, accounts for acuity, will be a candidate for new medications in the coming months ?

## 2021-05-06 NOTE — Assessment & Plan Note (Signed)
No sign of CNVM OS 

## 2021-05-06 NOTE — Assessment & Plan Note (Signed)
OS, small no high risk features continue to observe posterior pole ?

## 2021-05-31 ENCOUNTER — Ambulatory Visit: Payer: Medicare Other | Admitting: Neurology

## 2021-06-02 DIAGNOSIS — L570 Actinic keratosis: Secondary | ICD-10-CM | POA: Diagnosis not present

## 2021-06-02 DIAGNOSIS — Z85820 Personal history of malignant melanoma of skin: Secondary | ICD-10-CM | POA: Diagnosis not present

## 2021-06-02 DIAGNOSIS — I872 Venous insufficiency (chronic) (peripheral): Secondary | ICD-10-CM | POA: Diagnosis not present

## 2021-06-02 DIAGNOSIS — L814 Other melanin hyperpigmentation: Secondary | ICD-10-CM | POA: Diagnosis not present

## 2021-06-02 DIAGNOSIS — L853 Xerosis cutis: Secondary | ICD-10-CM | POA: Diagnosis not present

## 2021-06-10 DIAGNOSIS — H02834 Dermatochalasis of left upper eyelid: Secondary | ICD-10-CM | POA: Diagnosis not present

## 2021-06-10 DIAGNOSIS — H02831 Dermatochalasis of right upper eyelid: Secondary | ICD-10-CM | POA: Diagnosis not present

## 2021-06-10 DIAGNOSIS — H353132 Nonexudative age-related macular degeneration, bilateral, intermediate dry stage: Secondary | ICD-10-CM | POA: Diagnosis not present

## 2021-06-10 DIAGNOSIS — H52203 Unspecified astigmatism, bilateral: Secondary | ICD-10-CM | POA: Diagnosis not present

## 2021-06-23 ENCOUNTER — Other Ambulatory Visit: Payer: Self-pay | Admitting: Internal Medicine

## 2021-07-07 DIAGNOSIS — L814 Other melanin hyperpigmentation: Secondary | ICD-10-CM | POA: Diagnosis not present

## 2021-07-07 DIAGNOSIS — Z85820 Personal history of malignant melanoma of skin: Secondary | ICD-10-CM | POA: Diagnosis not present

## 2021-07-07 DIAGNOSIS — L821 Other seborrheic keratosis: Secondary | ICD-10-CM | POA: Diagnosis not present

## 2021-07-07 DIAGNOSIS — L570 Actinic keratosis: Secondary | ICD-10-CM | POA: Diagnosis not present

## 2021-07-26 ENCOUNTER — Encounter: Payer: Self-pay | Admitting: Adult Health

## 2021-07-26 ENCOUNTER — Non-Acute Institutional Stay: Payer: Medicare Other | Admitting: Adult Health

## 2021-07-26 VITALS — BP 108/80 | HR 74 | Temp 97.9°F | Ht 71.0 in | Wt 167.8 lb

## 2021-07-26 DIAGNOSIS — E034 Atrophy of thyroid (acquired): Secondary | ICD-10-CM

## 2021-07-26 DIAGNOSIS — F3341 Major depressive disorder, recurrent, in partial remission: Secondary | ICD-10-CM

## 2021-07-26 DIAGNOSIS — I10 Essential (primary) hypertension: Secondary | ICD-10-CM

## 2021-07-26 DIAGNOSIS — M316 Other giant cell arteritis: Secondary | ICD-10-CM | POA: Diagnosis not present

## 2021-07-26 DIAGNOSIS — I701 Atherosclerosis of renal artery: Secondary | ICD-10-CM | POA: Diagnosis not present

## 2021-07-26 DIAGNOSIS — R5383 Other fatigue: Secondary | ICD-10-CM

## 2021-07-26 DIAGNOSIS — I890 Lymphedema, not elsewhere classified: Secondary | ICD-10-CM | POA: Diagnosis not present

## 2021-07-26 DIAGNOSIS — N1831 Chronic kidney disease, stage 3a: Secondary | ICD-10-CM

## 2021-07-26 NOTE — Progress Notes (Unsigned)
Location:  [facility]  POS: Clinic  Provider: Royal Hawthorn, ANP  Code Status: DNR Goals of Care:     03/31/2021   10:32 AM  Advanced Directives  Does Patient Have a Medical Advance Directive? Yes  Type of Advance Directive Out of facility DNR (pink MOST or yellow form)  Does patient want to make changes to medical advance directive? No - Patient declined  Pre-existing out of facility DNR order (yellow form or pink MOST form) Yellow form placed in chart (order not valid for inpatient use);Pink MOST form placed in chart (order not valid for inpatient use)     Chief Complaint  Patient presents with   Medical Management of Chronic Issues    Patient returns to the clinic for his 4 month follow up.     HPI: Patient is a 88 y.o. male seen today for medical management of chronic diseases.    Feels fatigued, has achy in legs when walking Tired after 15 min of activity Has some aching in his left jaw that comes and goes not associated with activity or movement.  Feels dizzy when he wakes up in the morning but then it goes away. BP was 135/77 this morning prior to bp med. 86/66 here and then recheck at 108/80 Hx of tonsillar ca s/p radiation, follow up with oncology once a year Sarcoma of the RLE s/p XRT followed by [facility]. Lymphedema of the left leg Reports aching in right leg Sees Dr Dossie Der due to hx of aortitis syndrome.   Depression is still there but some improvement noted. Says he takes wellbutrin once daily.   Has CKD and left renal artery stenosis with aneurysm, followed by Dr [name].   He quit wearing compression hose on the left leg because he measured at thigh and did not see difference in size when wearing and when not wearing.  Past Medical History:  Diagnosis Date   Age-related macular degeneration, dry, both eyes    Aortitis syndrome ([place]) rheumotologist-  dr syed   IgG4 syndrome--  (effects abdomine) ,  previously long term use prednisone last taken 07/ 2021    Basal cell carcinoma (BCC) of eye    CKD (chronic kidney disease), stage III [facility])    nephrologist-- dr Johnette Abraham. [name]   Complication of anesthesia    post op acute urinary retention   DDD (degenerative disc disease)    Fatigue    pt evalulation by cardiology--- dr Fransico Michael, note 05-21-2019 in epic,(non cardiac)  normal echo and event monitor showed extra beats of atrial tachycardia , no atrial fib, and rare ecotpy   Full dentures    GERD (gastroesophageal reflux disease)    History of basal cell carcinoma (BCC) excision    12-25-2018 MOH's reconstruction left lower eyelid   History of external beam radiation therapy 2002   prostate cancer  and boost with radioative prostate seed implants   History of prostate cancer DX  2002   S/P EXTERNAL RADIATION/ RADIOACTIVE SEED IMPLANTS  2003   History of squamous cell carcinoma excision    2012--  RIGHT LOWER EXTREM;  2019 LEFT LOWER EXTREMITIY   HTN (hypertension)    followed by pcp   Hyperlipidemia    Hypothyroidism    followed by pcp--- acquired atrophy due to radiation;  previously seen by endocrinologist-  dr Chalmers Cater--- per pt takes his own thyroid med. 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  (09-06-2019  per pt no longer prednisone last taken one month ago 07/ 2021, weaned off by pcp per pt request)   Left renal artery stenosis [facility])    nephrologist--- dr [name]--- with aneurysm proximal    Lymphedema of left leg    followed by [facility]---- pt stated uses compression hose   OSA (obstructive sleep apnea) 09-06-2019  per pt lasted used cpap approx. 2019, stated felt he no longer needed    pt retested 11-13-2016 at [facility] w/ dr ather-- moderate to severe OSA , AHI 16.4/h/  03-24-2017    Pulmonary nodule followed by [facility]   per CT 06-11-2019  in [place] in epic done at [place],  LUL nodule , not mets   Renal cyst, right    Restless legs syndrome (RLS)    S/P radiation therapy     for tonsillar cancer (head and neck) completed 01-26-2012 at [facility]);   and XRT for right lower leg sarcoma --- completed 06-06-2017   Saliva decreased    Sarcoma of lower extremity, right ([place]) oncolgoist-  dr Juluis Rainier ([facility]) & Dr [name] ([facility])   dx 02-14-2017 w/ needle core bx;  high grade pleomorphic spindle cell sarcoma , grade 2 (cT2N0M0); 03-15-2017  radical resection sarcoma tumor right lower leg  and completed radiation 06-06-2017   Self-catheterizes urinary bladder    QID   AND   PRN   Tonsillar cancer ([place]) 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+) (Stage IVA)---- dx oct 2013  ----s/p left tonsillectomy and concurent chemo and radiation/  ended 01-26-2012-- no surgical intervention---  residuals ( dry mouth, decreased saliva)   oncologist at [place]--  dr brizel--  HX OF -- NO RECURRENCE   Urethral false passage 07/2019   Urethral stricture urologist--- dr Jeffie Pollock   chronic---  post urethral dilation's   Urinary retention with incomplete bladder emptying    Vitamin B 12 deficiency 01/28/2011   Vitamin D deficiency     Past Surgical History:  Procedure Laterality Date   BALLOON DILATION N/A 11/10/2014   Procedure: CYSTO BALLOON DILATION AND RETROGRADE URETHROGRAM ;  Surgeon: Bjorn Loser, MD;  Location: [facility];  Service: Urology;  Laterality: N/A;   CATARACT EXTRACTION W/ INTRAOCULAR LENS  IMPLANT, BILATERAL  2012  approx   CYSTO/ BALLOON DILATION OF  URETHRAL STRICTURE  12-25-2010   CYSTOSCOPY WITH RETROGRADE URETHROGRAM N/A 03/30/2016   Procedure: CYSTOSCOPY WITH RETROGRADE URETHROGRAM AND BALLOON DILATION with cystogram;  Surgeon: Bjorn Loser, MD;  Location: [facility];  Service: Urology;  Laterality: N/A;   CYSTOSCOPY WITH URETHRAL DILATATION  05/31/2011    Procedure: CYSTOSCOPY WITH URETHRAL DILATATION;  Surgeon: Reece Packer, MD;  Location: [place];  Service: Urology;  Laterality: N/A;  BALLOON DILATION   CYSTOSCOPY WITH URETHRAL DILATATION N/A 09/13/2016   Procedure: CYSTOSCOPY WITH URETHRAL BALLOON DILATATION;  Surgeon: Bjorn Loser, MD;  Location: [place];  Service: Urology;  Laterality: N/A;   CYSTOSCOPY WITH URETHRAL DILATATION N/A 03/30/2017   Procedure: CYSTOSCOPY WITH BALLOON URETHRAL DILATATION;  Surgeon: Bjorn Loser, MD;  Location: [place];  Service: Urology;  Laterality: N/A;   CYSTOSCOPY WITH URETHRAL DILATATION N/A 09/12/2019   Procedure: CYSTOSCOPY WITH URETHRAL DILATATION;  Surgeon: Irine Seal, MD;  Location: [facility];  Service:  Urology;  Laterality: N/A;   CYSTOSCOPY/RETROGRADE/URETEROSCOPY N/A 05/22/2012   Procedure: CYSTOSCOPY BALLOON DILATION RETROGRADE URETEROGRAM ;  Surgeon: Reece Packer, MD;  Location: [place];  Service: Urology;  Laterality: N/A;   CYTSO/  DILATATION URETHRAL STRICTURE/  BX PROSTATIC URETHRA/  REMOVAL FOREIGN BODIES  06-25-2010   [facility]   ECTROPION REPAIR Left 06/18/2019   lower eyelid   INGUINAL HERNIA REPAIR Right 2000   MOHS SURGERY  01/ 2019    [facility]   left lower leg for [facility]   MOHS SURGERY  12/25/2018   for [facility] of left lower eyelid;  the second stage Hughs flap release 01-09-2019   RADICAL RESECTION OF SARCOMA TUMOR   03-15-2017   [facility]   RIGHT LOWER LEG , CALF AREA   RADIOACTIVE SEED IMPLANTS, PROSTATE  JAN  2003   SKIN LESION EXCISION  07/2012   MOST  right shoulder   TONSILLECTOMY Left 11-10-2011  '@[facility]'$    tonsillar cancer   TRANSURETHRAL INCISION OF BLADDER NECK N/A 09/12/2019   Procedure: TRANSURETHRAL INCISION OF BLADDER NECK;  Surgeon: Irine Seal, MD;  Location: [place];  Service: Urology;  Laterality: N/A;    Allergies  Allergen Reactions   Cefixime Rash    Contrast Media [Iodinated Contrast Media] Other (See Comments)    Avoid due to renal disease   Macrobid [Nitrofurantoin Macrocrystal] Swelling   Nsaids Other (See Comments)    Has been told no NSAIDs, Ibuprofen Renal insufficiency hx.    Cephalosporins Rash    Outpatient Encounter Medications as of 07/26/2021  Medication Sig   buPROPion (WELLBUTRIN) 100 MG tablet Take 1 tablet (100 mg total) by mouth 3 (three) times daily. (Patient taking differently: Take 100 mg by mouth 2 (two) times daily.)   Cholecalciferol (VITAMIN D) 50 MCG (2000 UT) CAPS Take by mouth every other day.   Cranberry-Vitamin C (AZO CRANBERRY URINARY TRACT PO) Take by mouth.   cyanocobalamin (,VITAMIN B-12,) 1000 MCG/ML injection Inject 1 mL (1,000 mcg total) into the muscle every 14 (fourteen) days.   Garlic (GARLIQUE PO) Take by mouth every other day.   lisinopril (ZESTRIL) 10 MG tablet Take 1 tablet by mouth once daily   RESVERATROL PO Take 100 mg by mouth every other day.   SYRINGE-NEEDLE, DISP, 3 ML (BD ECLIPSE SYRINGE/NEEDLE) 25G X 5/8" 3 ML MISC One injection every 14 days   thyroid (ARMOUR) 90 MG tablet Take 90 mg by mouth daily.   TURMERIC CURCUMIN PO Take 2 tablets by mouth daily.    UNABLE TO FIND [place] Bone take once daily   UNABLE TO FIND [place] take 4 tablets once daily   UNABLE TO FIND Comprehensive immune support take 2 tablets once daily   UNABLE TO FIND Modified citrus pectin take 4 tablets once daily   UNABLE TO FIND Celery seed take 2 tablets by mouth once daily   UNABLE TO FIND Maxivision multiple vitamin for eyes   No facility-administered encounter medications on file as of 07/26/2021.    Review of Systems:  Review of Systems  Constitutional:  Positive for fatigue. Negative for activity change, appetite change, chills, diaphoresis, fever and unexpected weight change.  HENT:  Negative for sinus pressure, sneezing and trouble swallowing.        Jaw hurts on the left sometimes.     Respiratory:  Negative for cough, shortness of breath, wheezing and stridor.   Cardiovascular:  Positive for leg swelling. Negative for chest pain and palpitations.  Gastrointestinal:  Negative for abdominal distention, abdominal pain, constipation and diarrhea.  Genitourinary:  Negative for difficulty urinating and dysuria.  Musculoskeletal:  Negative for arthralgias, back pain, gait problem, joint swelling and myalgias.  Neurological:  Negative for dizziness, seizures, syncope, facial asymmetry, speech difficulty, weakness and headaches.  Hematological:  Negative for adenopathy. Does not bruise/bleed easily.  Psychiatric/Behavioral:  Positive for dysphoric mood. Negative for agitation, behavioral problems, confusion and suicidal ideas.     Health Maintenance  Topic Date Due   Zoster Vaccines- Shingrix (1 of 2) Never done   TETANUS/TDAP  01/17/2013   INFLUENZA VACCINE  08/17/2021   Pneumonia Vaccine 60+ Years old  Completed   COVID-19 Vaccine  Completed   HPV VACCINES  Aged Out    Physical Exam: Vitals:   07/26/21 1412  BP: (!) 86/66  Pulse: 74  Temp: 97.9 F (36.6 C)  SpO2: 94%  Weight: 167 lb 12.8 oz (76.1 kg)  Height: '5\' 11"'$  (1.803 m)   Body mass index is 23.4 kg/m. Wt Readings from Last 3 Encounters:  07/26/21 167 lb 12.8 oz (76.1 kg)  03/31/21 166 lb 3.2 oz (75.4 kg)  01/25/21 166 lb (75.3 kg)    Physical Exam Vitals and nursing note reviewed.  Constitutional:      General: He is not in acute distress.    Appearance: He is not diaphoretic.  HENT:     Head: Normocephalic and atraumatic.     Right Ear: Tympanic membrane normal.     Left Ear: Tympanic membrane normal.     Nose: Nose normal.     Mouth/Throat:     Mouth: Mucous membranes are moist.     Pharynx: Oropharynx is clear.  Neck:     Thyroid: No thyromegaly.     Vascular: No JVD.     Trachea: No tracheal deviation.  Cardiovascular:     Rate and Rhythm: Normal rate and regular rhythm.     Heart  sounds: No murmur heard. Pulmonary:     Effort: Pulmonary effort is normal. No respiratory distress.     Breath sounds: Normal breath sounds. No wheezing.  Abdominal:     General: Bowel sounds are normal. There is no distension.     Palpations: Abdomen is soft.     Tenderness: There is no abdominal tenderness.  Musculoskeletal:     Cervical back: Normal range of motion and neck supple.     Right lower leg: No edema.     Left lower leg: Edema present.  Lymphadenopathy:     Cervical: No cervical adenopathy.  Skin:    General: Skin is warm and dry.  Neurological:     Mental Status: He is alert and oriented to person, place, and time.     Cranial Nerves: No cranial nerve deficit.  Psychiatric:     Comments: Flat but more smiles than usual     Labs reviewed: Basic Metabolic Panel: Recent Labs    08/13/20 0000 10/06/20 0000 03/17/21 0000 03/23/21 0000  NA  --  139 140 140  K  --  4.6 5.0 4.8  CL  --  101 103 104  CO2  --  27* 27* 26*  BUN  --  34* 33* 33*  CREATININE  --  1.4* 1.5* 1.4*  CALCIUM  --  9.4 8.9 9.0  TSH 2.03  --   --   --    Liver Function Tests: Recent Labs    07/28/20 0000 10/06/20 0000 03/17/21 0000  AST 12* 10*  --   ALT 11 4*  --   ALKPHOS 57 56  --   ALBUMIN 4.2 4.2 4.0   No results for input(s): "LIPASE", "AMYLASE" in the last 8760 hours. No results for input(s): "AMMONIA" in the last 8760 hours. CBC: Recent Labs    07/28/20 0000 10/06/20 0000 03/23/21 0000  WBC 5.0 4.8 4.1  HGB 12.8* 13.4* 13.4*  HCT 37* 39* 39*  PLT 203 219 215   Lipid Panel: Recent Labs    07/28/20 0000  CHOL 218*  HDL 65  LDLCALC 139  TRIG 73   No results found for: "HGBA1C"  Procedures since last visit: No results found.  Assessment/Plan  1. Fatigue, unspecified type Likely due to underlying depression and sleep apnea which is being treated by neurology Will check labs.   2. Essential hypertension Recommending taking zestril only if SBP 150 or  greater due to low reading.  3. Recurrent major depressive disorder, in partial remission ([place]) Encouraged him to take the wellbutrin twice daily for mood as prescribed.   4. Hypothyroidism due to acquired atrophy of thyroid Check TSH On armour thyroid  5. Stage 3a chronic kidney disease ([place]) With left renal artery stenosis Followed by renal   6. Lymphedema of left leg Recommend knee high compression   7. Large vessel vasculitis ([place]) Followed by Dr Dossie Der Not currently on prednisone or having any issues.    Labs/tests ordered:  * No order type specified * TSH CBC BMP Next appt:     Total time 77mn:  time greater than 50% of total time spent doing pt counseling and coordination of care

## 2021-07-26 NOTE — Patient Instructions (Signed)
Recommend only taking Zestril if your SBP is 150 or greater  Try knee high compression hose  We will check your labs regarding the fatigue and let you know if there are any abnormalities.

## 2021-07-27 ENCOUNTER — Encounter: Payer: Self-pay | Admitting: Adult Health

## 2021-07-29 DIAGNOSIS — R5383 Other fatigue: Secondary | ICD-10-CM | POA: Diagnosis not present

## 2021-07-29 LAB — BASIC METABOLIC PANEL
BUN: 31 — AB (ref 4–21)
CO2: 23 — AB (ref 13–22)
Chloride: 105 (ref 99–108)
Creatinine: 1.7 — AB (ref 0.6–1.3)
Glucose: 137
Potassium: 4.7 mEq/L (ref 3.5–5.1)
Sodium: 142 (ref 137–147)

## 2021-07-29 LAB — COMPREHENSIVE METABOLIC PANEL: Calcium: 9 (ref 8.7–10.7)

## 2021-07-29 LAB — CBC AND DIFFERENTIAL
HCT: 39 — AB (ref 41–53)
Hemoglobin: 13.2 — AB (ref 13.5–17.5)
Platelets: 224 10*3/uL (ref 150–400)
WBC: 4.3

## 2021-07-29 LAB — CBC: RBC: 4.05 (ref 3.87–5.11)

## 2021-07-29 LAB — TSH: TSH: 0.97 (ref 0.41–5.90)

## 2021-08-03 ENCOUNTER — Telehealth: Payer: Self-pay

## 2021-08-03 NOTE — Telephone Encounter (Signed)
Patient called and wanted clarification of his lab results that he view through my chart is Philip [name] or Dr. Lyndel Safe can review them and know how to proceed I can contact the patient to advise

## 2021-08-04 DIAGNOSIS — L814 Other melanin hyperpigmentation: Secondary | ICD-10-CM | POA: Diagnosis not present

## 2021-08-04 DIAGNOSIS — N3020 Other chronic cystitis without hematuria: Secondary | ICD-10-CM | POA: Diagnosis not present

## 2021-08-04 DIAGNOSIS — L570 Actinic keratosis: Secondary | ICD-10-CM | POA: Diagnosis not present

## 2021-08-04 DIAGNOSIS — L905 Scar conditions and fibrosis of skin: Secondary | ICD-10-CM | POA: Diagnosis not present

## 2021-08-04 DIAGNOSIS — R3914 Feeling of incomplete bladder emptying: Secondary | ICD-10-CM | POA: Diagnosis not present

## 2021-08-04 DIAGNOSIS — L218 Other seborrheic dermatitis: Secondary | ICD-10-CM | POA: Diagnosis not present

## 2021-08-04 DIAGNOSIS — L988 Other specified disorders of the skin and subcutaneous tissue: Secondary | ICD-10-CM | POA: Diagnosis not present

## 2021-08-04 DIAGNOSIS — L821 Other seborrheic keratosis: Secondary | ICD-10-CM | POA: Diagnosis not present

## 2021-08-04 DIAGNOSIS — Z85820 Personal history of malignant melanoma of skin: Secondary | ICD-10-CM | POA: Diagnosis not present

## 2021-08-04 NOTE — Telephone Encounter (Signed)
Spoke with patient again to update him. He said thanks but he will further discuss with Dr. Lyndel Safe at their visit

## 2021-08-04 NOTE — Telephone Encounter (Signed)
The labs were done due to fatigue and low blood pressure. None of the results would explain his symptoms. The only slight change was his renal function. I would not do anything different but will recheck the BMP in 3 months. If the Creatinine is continuing to rise we can discuss with nephrology. Thanks.

## 2021-08-10 ENCOUNTER — Telehealth: Payer: Self-pay | Admitting: Neurology

## 2021-08-10 NOTE — Telephone Encounter (Signed)
LVM and sent mychart msg informing pt of r/s needed for 8/10 appt- MD out.

## 2021-08-24 ENCOUNTER — Ambulatory Visit (INDEPENDENT_AMBULATORY_CARE_PROVIDER_SITE_OTHER): Payer: Medicare Other | Admitting: Neurology

## 2021-08-24 ENCOUNTER — Encounter: Payer: Self-pay | Admitting: Neurology

## 2021-08-24 VITALS — BP 122/65 | HR 77 | Ht 71.0 in | Wt 167.5 lb

## 2021-08-24 DIAGNOSIS — G4734 Idiopathic sleep related nonobstructive alveolar hypoventilation: Secondary | ICD-10-CM

## 2021-08-24 DIAGNOSIS — I701 Atherosclerosis of renal artery: Secondary | ICD-10-CM

## 2021-08-24 DIAGNOSIS — Z789 Other specified health status: Secondary | ICD-10-CM | POA: Diagnosis not present

## 2021-08-24 DIAGNOSIS — G4733 Obstructive sleep apnea (adult) (pediatric): Secondary | ICD-10-CM

## 2021-08-24 DIAGNOSIS — Z8669 Personal history of other diseases of the nervous system and sense organs: Secondary | ICD-10-CM

## 2021-08-24 NOTE — Progress Notes (Signed)
SLEEP MEDICINE CLINIC    Provider:  Larey Seat, MD  Primary Care Physician:  Virgie Dad, MD [place] [id number]     Referring Provider: Virgie Dad, Md [place],   [phone]     CC to DR Zadie Rhine, MD Ophthalmology.        Chief Complaint according to patient   Patient presents with:     New Patient (Initial Visit)           HISTORY OF PRESENT ILLNESS:  Philip [name] is a 88 y.o. Caucasian male patient seen here in follow up from a HST at the [place] of this year- 02-15-2021:  Mr. Philip [name] is seen here today in a revisit, he has last been tested for sleep apnea in January of this year the Epworth sleepiness score was endorsed at 10 and the fatigue severity at 28 points.  The home sleep test confirmed that he had an overall AHI of 26.7/h during REM sleep this became 35.8/h and in non-REM sleep 24.2/h there was also a strong positional component to the severity of apnea which was 33.8/h in supine sleep.  However neither avoiding supine position alone nor did not in rem sleep alone would have resulted in a significant decrease in apnea he would still be carrying a diagnosis of mild to moderate apnea.   REM sleep was present for 21.6% of the total night according to the home sleep test calculation so he was not REM suppressed.   Intermittently the heart rate of this patient dropped and was slow and there was hypoxia noted 25 minutes of the night were associated with low oxygen levels the nadir being 65% saturation the patient now reports that he used his previously prescribed CPAP sporadically he used it for about 2 weeks in May, June and July ,but did not achieve a significant decrease in the AHI while doing so.  Residual AHI was 9.8 /h. All OSA, high air leakage.   The CPAP was set  up in 12-2016 and will soon be 88 years old- set at 9 cm water pressure with 3 cm EPR and allow and he still left with mostly obstructive events.  In  addition there is a high air leak from his mask.  The compliance was 28% for the last 90 days and the average user time was 2 hours 7 minutes, but on days when Mr. Philip [name] used the CPAP , he used it for 6.5 hours.   It looks to me that we would have to need to increase the CPAP pressure as the obstructive apneas are not sufficiently controlled.  I would also like for him to undergo a overnight pulse oximetry or even better to return for an in lab CPAP titration and see if he needs oxygen in conjunction with CPAP or if he could tolerate higher pressure of CPAP at all.     Last seen in CONSULTATION 01-26-2011 as a new patient referral on 08/24/2021 from Lyndel Safe, [place], NP.   Chief concern according to patient :  Rm 11, alone here after referral for insomnia. Pt was first dx with OSA by [facility] in 2010.  Used CPAP for a whole year and stopped after noticing no difference.  Around 2016 pt was again dx with OSA, used CPAP again for another year with no difference. Pt reports always being able to sleep through the night before and during the time he used  CPAP. He now has trouble staying asleep.  Waking up around 2-4 AM and having to take deeper breathes first few seconds.    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[place]) (rheumotologist-  dr syed), Basal cell carcinoma (BCC) of eye, CKD (chronic kidney disease), stage III ([place]), Complication of anesthesia, DDD (degenerative disc disease), Fatigue, Full dentures, GERD (gastroesophageal reflux disease), History of basal cell carcinoma (BCC) excision, History of external beam radiation therapy (2002), History of prostate cancer (DX  2002), History of squamous cell carcinoma excision, HTN (hypertension), Hyperlipidemia, Hypothyroidism, Large vessel vasculitis (Philip [name]), Left renal artery stenosis (Philip [name]), Lymphedema of left leg, OSA (obstructive sleep apnea) (09-06-2019  per pt lasted used  cpap approx. 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   The patient had the first sleep study in the year 2010 at [facility]-  had seen Dr Rexene [name] for second opinion in 2019.  CPAP [place] over a year was not improving daytime sleepiness and waking up early.   I finally learned at the end of this visit that the patient had undergone a HST as well, not listed on referral, 09-06-2019  per pt lasted used cpap approx. 20198-19 stated felt he no longer needed  OSA (obstructive sleep apnea) , the HST is not stated in EPIC.    Sleep relevant medical history: wakes up once for bathroom visit, now sleeping longer than usual, sleeping until 8.30-9 AM.  may be due to grief reaction, denies depression but uses the term heartbroken.   Family medical /sleep history: No other family member on CPAP with OSA, insomnia, sleep walkers.    Social history: Born and raised in [place], [place], left school at [employer] age 71 , started flying at age 33.  moved to the [place]- 1966, widowed since July 2022, after 24 years of marriage. He lives alone at [employer]. Previously married in the [place], 4 daughters in the [place]. Patient is retired from Insurance underwriter, Therapist, sports and later [employer],  used to have a cat, but no without pets.  Tobacco use: never .  ETOH use , 4 - 5 glasses a week maximally all his life- , Caffeine intake in form of Coffee( 1-3 cups) Soda( /) Tea ( some hot ) or energy drinks. Regular exercise in form of  walking.   Hobbies :reading, water sports, rowing.       Sleep habits are as follows: The patient's dinner time is between 6-7 PM. The patient goes to bed at 9-10 PM and continues to sleep for 4-5 hours, wakes for one bathroom breaks, but can't return to sleep easily.  The preferred sleep position is supine , with the support of 1-2 pillows. Dreams are reportedly infrequent/vivid.  7-9  AM is the usual rise time. The patient wakes up spontaneously around 4-5 AM He  reports not feeling refreshed or restored in AM. Naps are taken infrequently, more often since his wife passed.     Review of Systems: Out of a complete 14 system review, the patient complains of only the following symptoms, and all other reviewed systems are negative.:  Fatigue, sleepiness , snoring, fragmented sleep, Insomnia -  Actively grieving- lost his spouse in 08/07/2020.   Macular degeneration affecting central vision ., right over left eye.    How likely are you to doze in the following situations: 0 = not likely, 1 = slight chance, 2 = moderate chance, 3 = high chance   Sitting and Reading? Watching Television? Sitting inactive in a public place (theater or meeting)? As a passenger in a car for an hour without a break? Lying down in the afternoon when circumstances permit? Sitting and talking to someone? Sitting quietly after lunch without alcohol? In a car, while stopped for a few minutes in traffic?   Total = 7-10 / 24 points  today-08-24-2021 we are at 1 / 24 points.   FSS endorsed at  28/ 63 points.  GDS 2/ 15 points.   Social History   Socioeconomic History   Marital status: Widowed after 50 years of marriage     Spouse name: Angelita Ingles, died 08/07/2020.    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    Right handed   Social Determinants of Health   Financial Resource Strain: Not on file  Food Insecurity: Not on file  Transportation Needs: Not on file  Physical Activity: Not on file  Stress: Not on file  Social Connections: Not on file    Family History  Problem Relation Age of Onset   Stroke Father     Past Medical History:  Diagnosis Date   Age-related macular degeneration, dry, both eyes    Aortitis syndrome ([place]) rheumotologist-  dr syed   IgG4 syndrome--  (effects abdomine) ,  previously long term use prednisone last taken 07/ 2021   Basal cell carcinoma (BCC) of eye    CKD (chronic kidney disease), stage III [facility])    nephrologist-- dr Johnette Abraham. [name]   Complication of anesthesia    [name] op acute urinary retention   DDD (degenerative disc disease)    Fatigue    pt evalulation by cardiology--- dr Fransico Michael, note 05-21-2019 in epic,(non cardiac)  normal echo and event monitor showed extra beats of atrial tachycardia , no atrial fib, and rare ecotpy   Full dentures    GERD (gastroesophageal reflux disease)    History of basal cell carcinoma (BCC) excision    12-25-2018 [facility]'s reconstruction left lower eyelid   History of external beam radiation therapy 2002   prostate cancer  and boost with radioative prostate seed implants    History of prostate cancer DX  2002   S/P EXTERNAL RADIATION/ RADIOACTIVE SEED IMPLANTS  2003   History of squamous cell carcinoma excision    2012--  RIGHT LOWER EXTREM;  2019 LEFT LOWER EXTREMITIY   HTN (hypertension)    followed by pcp   Hyperlipidemia    Hypothyroidism    followed by pcp--- acquired atrophy due to radiation;  previously seen by endocrinologist-  dr Chalmers Cater--- per pt takes his own thyroid med. called NP Thyroid 90 mg daily, does not take synthroid   Large vessel vasculitis ([place])    rheumotologist-  dr syed --- IgG4 related aoritis involving renal arteries ,  pt treated with predisone.  (09-06-2019  per pt no longer prednisone last taken one month ago 07/ 2021, weaned off by pcp per pt request)   Left renal artery stenosis [facility])    nephrologist--- dr [name]--- with aneurysm proximal    Lymphedema of left leg    followed by [facility]---- pt stated uses compression hose   OSA (obstructive sleep apnea) 09-06-2019  per pt lasted used cpap approx. 2019, stated felt he no longer needed    pt retested 11-13-2016 at [facility] w/ dr ather-- moderate to severe OSA , AHI 16.4/h/  03-24-2017    Pulmonary nodule followed by [facility]   per CT 06-11-2019  in [place] in epic done at [place],  LUL nodule , not mets   Renal cyst, right    Restless legs syndrome (RLS)    S/P radiation therapy     for tonsillar cancer (head and neck) completed 01-26-2012 at [facility]);   and XRT for right lower leg sarcoma --- completed 06-06-2017   Saliva decreased    Sarcoma of lower extremity, right ([place]) oncolgoist-  dr Philip [name] ([facility]) & Dr [name] ([facility])   dx 02-14-2017 w/ needle core bx;  high grade pleomorphic spindle cell sarcoma , grade 2 (cT2N0M0); 03-15-2017  radical resection sarcoma tumor right lower leg  and completed radiation 06-06-2017   Self-catheterizes urinary bladder    QID   AND   PRN   Tonsillar cancer ([place]) unilateral squamous cell  tonsill and part of soft pallet (cT2 N2b) (p16+) (Stage IVA)---- dx oct 2013  ----s/p left tonsillectomy and concurent chemo and radiation/  ended 01-26-2012-- no surgical intervention---  residuals ( dry mouth, decreased saliva)   oncologist at [place]--  dr brizel--  HX OF -- NO RECURRENCE   Urethral false passage 07/2019   Urethral stricture urologist--- dr Jeffie Pollock   chronic---  [name] urethral dilation's   Urinary retention with incomplete bladder emptying    Vitamin B 12 deficiency 01/28/2011   Vitamin D deficiency     Past Surgical History:  Procedure Laterality Date   BALLOON DILATION N/A 11/10/2014   Procedure: CYSTO BALLOON DILATION AND RETROGRADE URETHROGRAM ;  Surgeon: Bjorn Loser, MD;  Location[facility];  Service: Urology;  Laterality: N/A;   CATARACT EXTRACTION W/ INTRAOCULAR LENS  IMPLANT, BILATERAL  2012  approx   CYSTO/ BALLOON DILATION OF  URETHRAL STRICTURE  12-25-2010   CYSTOSCOPY WITH RETROGRADE URETHROGRAM N/A 03/30/2016   Procedure: CYSTOSCOPY WITH RETROGRADE URETHROGRAM AND BALLOON DILATION with cystogram;  Surgeon: Bjorn Loser, MD;  Location: [facility];  Service: Urology;  Laterality: N/A;   CYSTOSCOPY WITH URETHRAL DILATATION  05/31/2011   Procedure: CYSTOSCOPY WITH URETHRAL DILATATION;  Surgeon: Elayne Snare  MacDiarmid, MD;  Location: [facility];  Service: Urology;  Laterality: N/A;  BALLOON DILATION   CYSTOSCOPY WITH URETHRAL DILATATION N/A 09/13/2016   Procedure: CYSTOSCOPY WITH URETHRAL BALLOON DILATATION;  Surgeon: Bjorn Loser, MD;  Location: [place];  Service: Urology;  Laterality: N/A;   CYSTOSCOPY WITH URETHRAL DILATATION N/A 03/30/2017   Procedure: CYSTOSCOPY WITH BALLOON URETHRAL DILATATION;  Surgeon: Bjorn Loser, MD;  Location: [place];  Service: Urology;  Laterality: N/A;   CYSTOSCOPY WITH URETHRAL DILATATION N/A 09/12/2019   Procedure: CYSTOSCOPY WITH URETHRAL  DILATATION;  Surgeon: Irine Seal, MD;  Location: [facility];  Service: Urology;  Laterality: N/A;   CYSTOSCOPY/RETROGRADE/URETEROSCOPY N/A 05/22/2012   Procedure: CYSTOSCOPY BALLOON DILATION RETROGRADE URETEROGRAM ;  Surgeon: Reece Packer, MD;  Location: [place];  Service: Urology;  Laterality: N/A;   CYTSO/  DILATATION URETHRAL STRICTURE/  BX PROSTATIC URETHRA/  REMOVAL FOREIGN BODIES  06-25-2010   [facility]   ECTROPION REPAIR Left 06/18/2019   lower eyelid   INGUINAL HERNIA REPAIR Right 2000   MOHS SURGERY  01/ 2019    [facility]   left lower leg for [facility]   MOHS SURGERY  12/25/2018   for [facility] of left lower eyelid;  the second stage Hughs flap release 01-09-2019   RADICAL RESECTION OF SARCOMA TUMOR   03-15-2017   [facility]   RIGHT LOWER LEG , CALF AREA   RADIOACTIVE SEED IMPLANTS, PROSTATE  JAN  2003   SKIN LESION EXCISION  07/2012   MOST  right shoulder   TONSILLECTOMY Left 11-10-2011  '@[facility]'$    tonsillar cancer   TRANSURETHRAL INCISION OF BLADDER NECK N/A 09/12/2019   Procedure: TRANSURETHRAL INCISION OF BLADDER NECK;  Surgeon: Irine Seal, MD;  Location: [place];  Service: Urology;  Laterality: N/A;     Current Outpatient Medications on File Prior to Visit  Medication Sig Dispense Refill   buPROPion (WELLBUTRIN) 100 MG tablet Take 1 tablet (100 mg total) by mouth 3 (three) times daily. (Patient taking differently: Take 100 mg by mouth 2 (two) times daily.) 180 tablet 1   Cholecalciferol (VITAMIN D) 50 MCG (2000 UT) CAPS Take by mouth every other day.     Cranberry-Vitamin C (AZO CRANBERRY URINARY TRACT PO) Take by mouth.     cyanocobalamin (,VITAMIN B-12,) 1000 MCG/ML injection Inject 1 mL (1,000 mcg total) into the muscle every 14 (fourteen) days. 30 mL 3   Garlic (GARLIQUE PO) Take by mouth every other day.     lisinopril (ZESTRIL) 10 MG tablet Take 1 tablet by mouth once daily 90 tablet 2   RESVERATROL PO Take 100 mg by mouth every other day.      SYRINGE-NEEDLE, DISP, 3 ML (BD ECLIPSE SYRINGE/NEEDLE) 25G X 5/8" 3 ML MISC One injection every 14 days 24 each 0   thyroid (ARMOUR) 90 MG tablet Take 90 mg by mouth daily.     TURMERIC CURCUMIN PO Take 2 tablets by mouth daily.      UNABLE TO FIND [place] Bone take once daily     UNABLE TO FIND [place] take 4 tablets once daily     UNABLE TO FIND Comprehensive immune support take 2 tablets once daily     UNABLE TO FIND Modified citrus pectin take 4 tablets once daily     UNABLE TO FIND Celery seed take 2 tablets by mouth once daily     UNABLE TO FIND Maxivision multiple vitamin for eyes  No current facility-administered medications on file prior to visit.    Allergies  Allergen Reactions   Cefixime Rash   Contrast Media [Iodinated Contrast Media] Other (See Comments)    Avoid due to renal disease   Macrobid [Nitrofurantoin Macrocrystal] Swelling   Nsaids Other (See Comments)    Has been told no NSAIDs, Ibuprofen Renal insufficiency hx.    Cephalosporins Rash    Physical exam:  Today's Vitals   08/24/21 1337  BP: 122/65  Pulse: 77  Weight: 167 lb 8 oz (76 kg)  Height: '5\' 11"'$  (1.803 m)   Body mass index is 23.36 kg/m.   Wt Readings from Last 3 Encounters:  08/24/21 167 lb 8 oz (76 kg)  07/26/21 167 lb 12.8 oz (76.1 kg)  03/31/21 166 lb 3.2 oz (75.4 kg)     Ht Readings from Last 3 Encounters:  08/24/21 '5\' 11"'$  (1.803 m)  07/26/21 '5\' 11"'$  (1.803 m)  03/31/21 '5\' 11"'$  (1.803 m)      General: The patient is awake, alert and appears sad but not in acute distress. The patient is well groomed. Head: Normocephalic, atraumatic.  Neck is supple. Mallampati 2,  tonsillary cancer affected  part of soft palate.  Chemo and radiation.  neck circumference:15 inches.  Nasal airflow patent.   Retrognathia is not seen.  Dental status: no biological teeth, all removed after cancer treatment in 2013. Tonsillary cancer , ENT .   Cardiovascular:  Regular rate and cardiac  rhythm by pulse,  without distended neck veins. Respiratory: Lungs are clear to auscultation.  Skin:  With  evidence of severe left ankle edema.  Neurologic exam : The patient is awake and alert, oriented to place and time.   Memory subjective described as intact.  Attention span & concentration ability appears normal.  Speech is fluent,  without  dysarthria, dysphonia or aphasia.  Mood and affect are appropriate.   Cranial nerves: no loss of smell or taste reported  Pupils are not equal  in size, and slowly  reactive to light. Funduscopic exam deferred. .  Extraocular movements in vertical and horizontal planes were intact and without nystagmus. No Diplopia. Visual fields by finger perimetry are limited on the right  Hearing was intact to soft voice and finger rubbing.     Facial sensation intact to fine touch.  Facial motor strength is symmetric and tongue and uvula move midline.  Neck ROM : rotation, tilt and flexion extension were normal for age and shoulder shrug was symmetrical.    Motor exam:  Symmetric bulk, tone and ROM.   Normal tone without cog -wheeling, symmetric grip strength .   Sensory:  Fine touch, pinprick and vibration were tested  and  normal, except left ankle.   Proprioception tested in the upper extremities was normal.   Coordination: Rapid alternating movements in the fingers/hands were of normal speed.  The Finger-to-nose maneuver was intact without evidence of ataxia, dysmetria or tremor.   Gait and station: Patient could rise unassisted from a seated position, walked without assistive device.  Stance is of normal width/ base ,  Gait affected by vision impairment.  Deep tendon reflexes: in the  upper and lower extremities are symmetric and intact.  Babinski response was deferred       After spending a total time of  28  minutes face to face and additional time for physical and neurologic examination, review of laboratory studies,  personal review of  imaging studies, reports and results of other  testing and review of referral information / records as far as provided in visit, I have established the following assessments:  1) he presented with chronic insomnia, further affected by Grief. Geriatric depression score was 7 points, may be temporarily.   He has early morning arousals, often a sign of depression, serotonin or melatonin deficient. This has improved his sleep by now- on melatonin. He sleeps 60-90 minutes longer, no longer waking at 2-3 AM.   2) He still has OSA, moderate and yet finds no relief by CPAP.    My Plan is to proceed with:  We discussed sleep hygiene- no screen light in bed!  1)I like for Mr. Gordy Levan to spend the night in the sleep lab for an attended CPAP titration but also gives me the opportunity to add oxygen if needed.  He also needs to be refitted for a mask as he has very large air leaks.  Since he was not highly compliant in the past he does not have a DME now giving him supplies, so I will ask the sleep tech at night to send him home with a mask that he tolerated best.  A new CPAP prescription will be issued after the sleep test and I would add oxygen as needed.  I would also be open to change the patient to a BiPAP setting if he tolerates this better.  2) melatonin 5 mg or less at night, he feels this has helped.  3) Wellbutrin is a good medication for the morning hours.   I would like to thank Virgie Dad, MD and  Sanda Linger, NP, for allowing me to meet with and to take care of this pleasant patient.   In short, JUVON TEATER is presenting still with early morning arousals, but has gained 60-90 minutes of sleep duration.  He has not felt improvement on his older CPAP machine wit current 9 cm water setting and an ill fitting mask.  He needs to have an attended sleep study, new titration, no ew mask fitting and possible oxygen supplement.  I plan to follow up either personally or through our NP within 3-4  month.     CC: I will share my notes with PCP.  Electronically signed by: Larey Seat, MD 08/24/2021 2:07 PM  [facility] Board certified by The [employer] of Sleep Medicine and Diplomate of the [employer] of Sleep Medicine. Board certified In Neurology through the [place], Fellow of the [employer] of Neurology. Medical Director of [employer].

## 2021-08-24 NOTE — Patient Instructions (Addendum)
Insomnia Insomnia is a sleep disorder that makes it difficult to fall asleep or stay asleep. Insomnia can cause fatigue, low energy, difficulty concentrating, mood swings, and poor performance at work or school. There are three different ways to classify insomnia: Difficulty falling asleep. Difficulty staying asleep. Waking up too early in the morning. Any type of insomnia can be long-term (chronic) or short-term (acute). Both are common. Short-term insomnia usually lasts for 3 months or less. Chronic insomnia occurs at least three times a week for longer than 3 months. What are the causes? Insomnia may be caused by another condition, situation, or substance, such as: Having certain mental health conditions, such as anxiety and depression. Using caffeine, alcohol, tobacco, or drugs. Having gastrointestinal conditions, such as gastroesophageal reflux disease (GERD). Having certain medical conditions. These include: Asthma. Alzheimer's disease. Stroke. Chronic pain. An overactive thyroid gland (hyperthyroidism). Other sleep disorders, such as restless legs syndrome and sleep apnea. Menopause. Sometimes, the cause of insomnia may not be known. What increases the risk? Risk factors for insomnia include: Gender. Females are affected more often than males. Age. Insomnia is more common as people get older. Stress and certain medical and mental health conditions. Lack of exercise. Having an irregular work schedule. This may include working night shifts and traveling between different time zones. What are the signs or symptoms? If you have insomnia, the main symptom is having trouble falling asleep or having trouble staying asleep. This may lead to other symptoms, such as: Feeling tired or having low energy. Feeling nervous about going to sleep. Not feeling rested in the morning. Having trouble concentrating. Feeling irritable, anxious, or depressed. How is this diagnosed? This condition  may be diagnosed based on: Your symptoms and medical history. Your health care provider may ask about: Your sleep habits. Any medical conditions you have. Your mental health. A physical exam. How is this treated? Treatment for insomnia depends on the cause. Treatment may focus on treating an underlying condition that is causing the insomnia. Treatment may also include: Medicines to help you sleep. Counseling or therapy. Lifestyle adjustments to help you sleep better. Follow these instructions at home: Eating and drinking  Limit or avoid alcohol, caffeinated beverages, and products that contain nicotine and tobacco, especially close to bedtime. These can disrupt your sleep. Do not eat a large meal or eat spicy foods right before bedtime. This can lead to digestive discomfort that can make it hard for you to sleep. Sleep habits  Keep a sleep diary to help you and your health care provider figure out what could be causing your insomnia. Write down: When you sleep. When you wake up during the night. How well you sleep and how rested you feel the next day. Any side effects of medicines you are taking. What you eat and drink. Make your bedroom a dark, comfortable place where it is easy to fall asleep. Put up shades or blackout curtains to block light from outside. Use a white noise machine to block noise. Keep the temperature cool. Limit screen use before bedtime. This includes: Not watching TV. Not using your smartphone, tablet, or computer. Stick to a routine that includes going to bed and waking up at the same times every day and night. This can help you fall asleep faster. Consider making a quiet activity, such as reading, part of your nighttime routine. Try to avoid taking naps during the day so that you sleep better at night. Get out of bed if you are still awake after   15 minutes of trying to sleep. Keep the lights down, but try reading or doing a quiet activity. When you feel  sleepy, go back to bed. General instructions Take over-the-counter and prescription medicines only as told by your health care provider. Exercise regularly as told by your health care provider. However, avoid exercising in the hours right before bedtime. Use relaxation techniques to manage stress. Ask your health care provider to suggest some techniques that may work well for you. These may include: Breathing exercises. Routines to release muscle tension. Visualizing peaceful scenes. Make sure that you drive carefully. Do not drive if you feel very sleepy. Keep all follow-up visits. This is important. Contact a health care provider if: You are tired throughout the day. You have trouble in your daily routine due to sleepiness. You continue to have sleep problems, or your sleep problems get worse. Get help right away if: You have thoughts about hurting yourself or someone else. Get help right away if you feel like you may hurt yourself or others, or have thoughts about taking your own life. Go to your nearest emergency room or: Call 911. Call the [facility] at [phone] or 988. This is open 24 hours a day. Text the Crisis Text Line at 801-192-4442. Summary Insomnia is a sleep disorder that makes it difficult to fall asleep or stay asleep. Insomnia can be long-term (chronic) or short-term (acute). Treatment for insomnia depends on the cause. Treatment may focus on treating an underlying condition that is causing the insomnia. Keep a sleep diary to help you and your health care provider figure out what could be causing your insomnia. This information is not intended to replace advice given to you by your health care provider. Make sure you discuss any questions you have with your health care provider. Document Revised: 12/14/2020 Document Reviewed: 12/14/2020 Elsevier Patient Education  [place]. Sleep Apnea Sleep apnea affects breathing during sleep. It  causes breathing to stop for 10 seconds or more, or to become shallow. People with sleep apnea usually snore loudly. It can also increase the risk of: Heart attack. Stroke. Being very overweight (obese). Diabetes. Heart failure. Irregular heartbeat. High blood pressure. The goal of treatment is to help you breathe normally again. What are the causes?  The most common cause of this condition is a collapsed or blocked airway. There are three kinds of sleep apnea: Obstructive sleep apnea. This is caused by a blocked or collapsed airway. Central sleep apnea. This happens when the brain does not send the right signals to the muscles that control breathing. Mixed sleep apnea. This is a combination of obstructive and central sleep apnea. What increases the risk? Being overweight. Smoking. Having a small airway. Being older. Being male. Drinking alcohol. Taking medicines to calm yourself (sedatives or tranquilizers). Having family members with the condition. Having a tongue or tonsils that are larger than normal. What are the signs or symptoms? Trouble staying asleep. Loud snoring. Headaches in the morning. Waking up gasping. Dry mouth or sore throat in the morning. Being sleepy or tired during the day. If you are sleepy or tired during the day, you may also: Not be able to focus your mind (concentrate). Forget things. Get angry a lot and have mood swings. Feel sad (depressed). Have changes in your personality. Have less interest in sex, if you are male. Be unable to have an erection, if you are male. How is this treated?  Sleeping on your side. Using a medicine  to get rid of mucus in your nose (decongestant). Avoiding the use of alcohol, medicines to help you relax, or certain pain medicines (narcotics). Losing weight, if needed. Changing your diet. Quitting smoking. Using a machine to open your airway while you sleep, such as: An oral appliance. This is a mouthpiece  that shifts your lower jaw forward. A CPAP device. This device blows air through a mask when you breathe out (exhale). An EPAP device. This has valves that you put in each nostril. A BIPAP device. This device blows air through a mask when you breathe in (inhale) and breathe out. Having surgery if other treatments do not work. Follow these instructions at home: Lifestyle Make changes that your doctor recommends. Eat a healthy diet. Lose weight if needed. Avoid alcohol, medicines to help you relax, and some pain medicines. Do not smoke or use any products that contain nicotine or tobacco. If you need help quitting, ask your doctor. General instructions Take over-the-counter and prescription medicines only as told by your doctor. If you were given a machine to use while you sleep, use it only as told by your doctor. If you are having surgery, make sure to tell your doctor you have sleep apnea. You may need to bring your device with you. Keep all follow-up visits. Contact a doctor if: The machine that you were given to use during sleep bothers you or does not seem to be working. You do not get better. You get worse. Get help right away if: Your chest hurts. You have trouble breathing in enough air. You have an uncomfortable feeling in your back, arms, or stomach. You have trouble talking. One side of your body feels weak. A part of your face is hanging down. These symptoms may be an emergency. Get help right away. Call your local emergency services (911 in the [place] Do not wait to see if the symptoms will go away. Do not drive yourself to the hospital. Summary This condition affects breathing during sleep. The most common cause is a collapsed or blocked airway. The goal of treatment is to help you breathe normally while you sleep. This information is not intended to replace advice given to you by your health care provider. Make sure you discuss any questions you have with your health  care provider. Document Revised: 08/12/2020 Document Reviewed: 12/13/2019 Elsevier Patient Education  [place]. CPAP and BIPAP Information CPAP and BIPAP are methods that use air pressure to keep your airways open and to help you breathe well. CPAP and BIPAP use different amounts of pressure. Your health care provider will tell you whether CPAP or BIPAP would be more helpful for you. CPAP stands for "continuous positive airway pressure." With CPAP, the amount of pressure stays the same while you breathe in (inhale) and out (exhale). BIPAP stands for "bi-level positive airway pressure." With BIPAP, the amount of pressure will be higher when you inhale and lower when you exhale. This allows you to take larger breaths. CPAP or BIPAP may be used in the hospital, or your health care provider may want you to use it at home. You may need to have a sleep study before your health care provider can order a machine for you to use at home. What are the advantages? CPAP or BIPAP can be helpful if you have: Sleep apnea. Chronic obstructive pulmonary disease (COPD). Heart failure. Medical conditions that cause muscle weakness, including muscular dystrophy or amyotrophic lateral sclerosis (ALS). Other problems that cause breathing to  be shallow, weak, abnormal, or difficult. CPAP and BIPAP are most commonly used for obstructive sleep apnea (OSA) to keep the airways from collapsing when the muscles relax during sleep. What are the risks? Generally, this is a safe treatment. However, problems may occur, including: Irritated skin or skin sores if the mask does not fit properly. Dry or stuffy nose or nosebleeds. Dry mouth. Feeling gassy or bloated. Sinus or lung infection if the equipment is not cleaned properly. When should CPAP or BIPAP be used? In most cases, the mask only needs to be worn during sleep. Generally, the mask needs to be worn throughout the night and during any daytime naps. People  with certain medical conditions may also need to wear the mask at other times, such as when they are awake. Follow instructions from your health care provider about when to use the machine. What happens during CPAP or BIPAP?  Both CPAP and BIPAP are provided by a small machine with a flexible plastic tube that attaches to a plastic mask that you wear. Air is blown through the mask into your nose or mouth. The amount of pressure that is used to blow the air can be adjusted on the machine. Your health care provider will set the pressure setting and help you find the best mask for you. Tips for using the mask Because the mask needs to be snug, some people feel trapped or closed-in (claustrophobic) when first using the mask. If you feel this way, you may need to get used to the mask. One way to do this is to hold the mask loosely over your nose or mouth and then gradually apply the mask more snugly. You can also gradually increase the amount of time that you use the mask. Masks are available in various types and sizes. If your mask does not fit well, talk with your health care provider about getting a different one. Some common types of masks include: Full face masks, which fit over the mouth and nose. Nasal masks, which fit over the nose. Nasal pillow or prong masks, which fit into the nostrils. If you are using a mask that fits over your nose and you tend to breathe through your mouth, a chin strap may be applied to help keep your mouth closed. Use a skin barrier to protect your skin as told by your health care provider. Some CPAP and BIPAP machines have alarms that may sound if the mask comes off or develops a leak. If you have trouble with the mask, it is very important that you talk with your health care provider about finding a way to make the mask easier to tolerate. Do not stop using the mask. There could be a negative impact on your health if you stop using the mask. Tips for using the  machine Place your CPAP or BIPAP machine on a secure table or stand near an electrical outlet. Know where the on/off switch is on the machine. Follow instructions from your health care provider about how to set the pressure on your machine and when you should use it. Do not eat or drink while the CPAP or BIPAP machine is on. Food or fluids could get pushed into your lungs by the pressure of the CPAP or BIPAP. For home use, CPAP and BIPAP machines can be rented or purchased through home health care companies. Many different brands of machines are available. Renting a machine before purchasing may help you find out which particular machine works well for  you. [place] may also decide which machine you may get. Keep the CPAP or BIPAP machine and attachments clean. Ask your health care provider for specific instructions. Check the humidifier if you have a dry stuffy nose or nosebleeds. Make sure it is working correctly. Follow these instructions at home: Take over-the-counter and prescription medicines only as told by your health care provider. Ask if you can take sinus medicine if your sinuses are blocked. Do not use any products that contain nicotine or tobacco. These products include cigarettes, chewing tobacco, and vaping devices, such as e-cigarettes. If you need help quitting, ask your health care provider. Keep all follow-up visits. This is important. Contact a health care provider if: You have redness or pressure sores on your head, face, mouth, or nose from the mask or head gear. You have trouble using the CPAP or BIPAP machine. You cannot tolerate wearing the CPAP or BIPAP mask. Someone tells you that you snore even when wearing your CPAP or BIPAP. Get help right away if: You have trouble breathing. You feel confused. Summary CPAP and BIPAP are methods that use air pressure to keep your airways open and to help you breathe well. If you have trouble with the mask, it  is very important that you talk with your health care provider about finding a way to make the mask easier to tolerate. Do not stop using the mask. There could be a negative impact to your health if you stop using the mask. Follow instructions from your health care provider about when to use the machine. This information is not intended to replace advice given to you by your health care provider. Make sure you discuss any questions you have with your health care provider. Document Revised: 08/12/2020 Document Reviewed: 12/13/2019 Elsevier Patient Education  [place].

## 2021-08-26 ENCOUNTER — Ambulatory Visit: Payer: Medicare Other | Admitting: Neurology

## 2021-09-06 ENCOUNTER — Encounter (INDEPENDENT_AMBULATORY_CARE_PROVIDER_SITE_OTHER): Payer: Medicare Other | Admitting: Ophthalmology

## 2021-09-07 DIAGNOSIS — I77810 Thoracic aortic ectasia: Secondary | ICD-10-CM | POA: Diagnosis not present

## 2021-09-07 DIAGNOSIS — C4921 Malignant neoplasm of connective and soft tissue of right lower limb, including hip: Secondary | ICD-10-CM | POA: Diagnosis not present

## 2021-09-07 DIAGNOSIS — C499 Malignant neoplasm of connective and soft tissue, unspecified: Secondary | ICD-10-CM | POA: Diagnosis not present

## 2021-09-21 ENCOUNTER — Ambulatory Visit (INDEPENDENT_AMBULATORY_CARE_PROVIDER_SITE_OTHER): Payer: Medicare Other | Admitting: Ophthalmology

## 2021-09-21 ENCOUNTER — Encounter (INDEPENDENT_AMBULATORY_CARE_PROVIDER_SITE_OTHER): Payer: Self-pay | Admitting: Ophthalmology

## 2021-09-21 DIAGNOSIS — I701 Atherosclerosis of renal artery: Secondary | ICD-10-CM

## 2021-09-21 DIAGNOSIS — H353114 Nonexudative age-related macular degeneration, right eye, advanced atrophic with subfoveal involvement: Secondary | ICD-10-CM | POA: Diagnosis not present

## 2021-09-21 DIAGNOSIS — H353123 Nonexudative age-related macular degeneration, left eye, advanced atrophic without subfoveal involvement: Secondary | ICD-10-CM

## 2021-09-21 DIAGNOSIS — H353132 Nonexudative age-related macular degeneration, bilateral, intermediate dry stage: Secondary | ICD-10-CM | POA: Diagnosis not present

## 2021-09-21 NOTE — Assessment & Plan Note (Signed)
Extensive perifoveal atrophy.  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

## 2021-09-21 NOTE — Assessment & Plan Note (Signed)
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

## 2021-09-21 NOTE — Progress Notes (Signed)
09/21/2021     CHIEF COMPLAINT Patient presents for  Chief Complaint  Patient presents with   Macular Degeneration      HISTORY OF PRESENT ILLNESS: Philip [name] is a 88 y.o. male who presents to the clinic today for:   HPI   4 mths color fp oct dilate ou Pt states his vision has been stable Pt admits to floaters in both eyes Pt states they are small red and black dots in a group they come and go Pt denies FOL  Last edited by Morene Rankins, CMA on 09/21/2021  2:10 PM.      Referring physician: Virgie Dad, MD [place],  [place] [address]  HISTORICAL INFORMATION:   Selected notes from the [place]: No current outpatient medications on file. (Ophthalmic Drugs)   No current facility-administered medications for this visit. (Ophthalmic Drugs)   Current Outpatient Medications (Other)  Medication Sig   buPROPion (WELLBUTRIN) 100 MG tablet Take 1 tablet (100 mg total) by mouth 3 (three) times daily. (Patient taking differently: Take 100 mg by mouth 2 (two) times daily.)   Cholecalciferol (VITAMIN D) 50 MCG (2000 UT) CAPS Take by mouth every other day.   Cranberry-Vitamin C (AZO CRANBERRY URINARY TRACT PO) Take by mouth.   cyanocobalamin (,VITAMIN B-12,) 1000 MCG/ML injection Inject 1 mL (1,000 mcg total) into the muscle every 14 (fourteen) days.   Garlic (GARLIQUE PO) Take by mouth every other day.   lisinopril (ZESTRIL) 10 MG tablet Take 1 tablet by mouth once daily   RESVERATROL PO Take 100 mg by mouth every other day.   SYRINGE-NEEDLE, DISP, 3 ML (BD ECLIPSE SYRINGE/NEEDLE) 25G X 5/8" 3 ML MISC One injection every 14 days   thyroid (ARMOUR) 90 MG tablet Take 90 mg by mouth daily.   TURMERIC CURCUMIN PO Take 2 tablets by mouth daily.    UNABLE TO FIND [place] Bone take once daily   UNABLE TO FIND [place] take 4 tablets once daily   UNABLE TO FIND Comprehensive immune support take 2 tablets once daily    UNABLE TO FIND Modified citrus pectin take 4 tablets once daily   UNABLE TO FIND Celery seed take 2 tablets by mouth once daily   UNABLE TO FIND Maxivision multiple vitamin for eyes   No current facility-administered medications for this visit. (Other)      REVIEW OF SYSTEMS: ROS   Negative for: Constitutional, Gastrointestinal, Neurological, Skin, Genitourinary, Musculoskeletal, HENT, Endocrine, Cardiovascular, Eyes, Respiratory, Psychiatric, Allergic/Imm, Heme/Lymph Last edited by Morene Rankins, CMA on 09/21/2021  2:10 PM.       ALLERGIES Allergies  Allergen Reactions   Cefixime Rash   Contrast Media [Iodinated Contrast Media] Other (See Comments)    Avoid due to renal disease   Macrobid [Nitrofurantoin Macrocrystal] Swelling   Nsaids Other (See Comments)    Has been told no NSAIDs, Ibuprofen Renal insufficiency hx.    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 [name]   Complication of anesthesia    post  op acute urinary retention   DDD (degenerative disc disease)    Fatigue    pt evalulation by cardiology--- dr Fransico Michael, note 05-21-2019 in epic,(non cardiac)  normal echo and event monitor showed extra beats of atrial tachycardia , no atrial fib, and rare ecotpy   Full dentures    GERD (gastroesophageal reflux disease)    History of basal cell carcinoma (BCC) excision    12-25-2018 [facility]'s reconstruction left lower eyelid   History of external beam radiation therapy 2002   prostate cancer  and boost with radioative prostate seed implants   History of prostate cancer DX  2002   S/P EXTERNAL RADIATION/ RADIOACTIVE SEED  IMPLANTS  2003   History of squamous cell carcinoma excision    2012--  RIGHT LOWER EXTREM;  2019 LEFT LOWER EXTREMITIY   HTN (hypertension)    followed by pcp   Hyperlipidemia    Hypothyroidism    followed by pcp--- acquired atrophy due to radiation;  previously seen by endocrinologist-  dr Chalmers Cater--- per pt takes his own thyroid med. called NP Thyroid 90 mg daily, does not take synthroid   Large vessel vasculitis ([place])    rheumotologist-  dr syed --- IgG4 related aoritis involving renal arteries ,  pt treated with predisone.  (09-06-2019  per pt no longer prednisone last taken one month ago 07/ 2021, weaned off by pcp per pt request)   Left renal artery stenosis [facility])    nephrologist--- dr [name]--- with aneurysm proximal    Lymphedema of left leg    followed by [facility]---- pt stated uses compression hose   OSA (obstructive sleep apnea) 09-06-2019  per pt lasted used cpap approx. 2019, stated felt he no longer needed    pt retested 11-13-2016 at [facility] w/ dr ather-- moderate to severe OSA , AHI 16.4/h/  03-24-2017    Pulmonary nodule followed by [facility]   per CT 06-11-2019  in [place] in epic done at [place],  LUL nodule , not mets   Renal cyst, right    Restless legs syndrome (RLS)    S/P radiation therapy     for tonsillar cancer (head and neck) completed 01-26-2012 at [facility]);   and XRT for right lower leg sarcoma --- completed 06-06-2017   Saliva decreased    Sarcoma of lower extremity, right ([place]) oncolgoist-  dr Juluis Rainier ([facility]) & Dr [name] ([facility])   dx 02-14-2017 w/ needle core bx;  high grade pleomorphic spindle cell sarcoma , grade 2 (cT2N0M0); 03-15-2017  radical resection sarcoma tumor right lower leg  and completed radiation 06-06-2017   Self-catheterizes urinary bladder    QID   AND   PRN   Tonsillar cancer ([place]) 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+) (Stage IVA)---- dx oct 2013  ----s/p  left tonsillectomy and concurent chemo and radiation/  ended 01-26-2012-- no surgical intervention---  residuals ( dry mouth, decreased saliva)   oncologist at [place]--  dr brizel--  HX OF -- NO RECURRENCE   Urethral false passage 07/2019   Urethral stricture urologist--- dr Jeffie Pollock   chronic---  post urethral dilation's   Urinary retention with incomplete bladder emptying    Vitamin B 12 deficiency 01/28/2011   Vitamin D deficiency    Past Surgical History:  Procedure Laterality Date   BALLOON DILATION N/A 11/10/2014   Procedure: CYSTO BALLOON DILATION AND RETROGRADE URETHROGRAM ;  Surgeon: Bjorn Loser, MD;  Location:  [place];  Service: Urology;  Laterality: N/A;   CATARACT EXTRACTION W/ INTRAOCULAR LENS  IMPLANT, BILATERAL  2012  approx   CYSTO/ BALLOON DILATION OF  URETHRAL STRICTURE  12-25-2010   CYSTOSCOPY WITH RETROGRADE URETHROGRAM N/A 03/30/2016   Procedure: CYSTOSCOPY WITH RETROGRADE URETHROGRAM AND BALLOON DILATION with cystogram;  Surgeon: Bjorn Loser, MD;  Location: [facility];  Service: Urology;  Laterality: N/A;   CYSTOSCOPY WITH URETHRAL DILATATION  05/31/2011   Procedure: CYSTOSCOPY WITH URETHRAL DILATATION;  Surgeon: Reece Packer, MD;  Location: [place];  Service: Urology;  Laterality: N/A;  BALLOON DILATION   CYSTOSCOPY WITH URETHRAL DILATATION N/A 09/13/2016   Procedure: CYSTOSCOPY WITH URETHRAL BALLOON DILATATION;  Surgeon: Bjorn Loser, MD;  Location: [place];  Service: Urology;  Laterality: N/A;   CYSTOSCOPY WITH URETHRAL DILATATION N/A 03/30/2017   Procedure: CYSTOSCOPY WITH BALLOON URETHRAL DILATATION;  Surgeon: Bjorn Loser, MD;  Location: [place];  Service: Urology;  Laterality: N/A;   CYSTOSCOPY WITH URETHRAL DILATATION N/A 09/12/2019   Procedure: CYSTOSCOPY WITH URETHRAL DILATATION;  Surgeon: Irine Seal, MD;  Location: [facility];  Service:  Urology;  Laterality: N/A;   CYSTOSCOPY/RETROGRADE/URETEROSCOPY N/A 05/22/2012   Procedure: CYSTOSCOPY BALLOON DILATION RETROGRADE URETEROGRAM ;  Surgeon: Reece Packer, MD;  Location: [place];  Service: Urology;  Laterality: N/A;   CYTSO/  DILATATION URETHRAL STRICTURE/  BX PROSTATIC URETHRA/  REMOVAL FOREIGN BODIES  06-25-2010   [facility]   ECTROPION REPAIR Left 06/18/2019   lower eyelid   INGUINAL HERNIA REPAIR Right 2000   MOHS SURGERY  01/ 2019    [facility]   left lower leg for [facility]   MOHS SURGERY  12/25/2018   for [facility] of left lower eyelid;  the second stage Hughs flap release 01-09-2019   RADICAL RESECTION OF SARCOMA TUMOR   03-15-2017   [facility]   RIGHT LOWER LEG , CALF AREA   RADIOACTIVE SEED IMPLANTS, PROSTATE  JAN  2003   SKIN LESION EXCISION  07/2012   MOST  right shoulder   TONSILLECTOMY Left 11-10-2011  '@[facility]'$    tonsillar cancer   TRANSURETHRAL INCISION OF BLADDER NECK N/A 09/12/2019   Procedure: TRANSURETHRAL INCISION OF BLADDER NECK;  Surgeon: Irine Seal, MD;  Location: [place];  Service: Urology;  Laterality: N/A;    FAMILY HISTORY Family History  Problem Relation Age of Onset   Stroke Father     SOCIAL HISTORY Social History   Tobacco Use   Smoking status: Never   Smokeless tobacco: Never  Vaping Use   Vaping Use: Never used  Substance Use Topics   Alcohol use: Yes    Alcohol/week: 7.0 standard drinks of alcohol    Types: 7 Glasses of wine per week    Comment: 8oz, glass or red wine for dinner   Drug use: No         OPHTHALMIC EXAM:  Base Eye Exam     Visual Acuity (ETDRS)       Right Left   Dist  CF at 6' 20/25 -2    Correction: Glasses         Tonometry (Tonopen, 2:18 PM)       Right Left   Pressure 8 7         Pupils       Pupils APD   Right PERRL None   Left PERRL  Extraocular Movement       Right Left    Ortho Ortho    -- -- --  --  --  -- -- --   -- -- --  --  --  -- -- --            Neuro/Psych     Oriented x3: Yes   Mood/Affect: Normal         Dilation     Both eyes: 1.0% Mydriacyl, 2.5% Phenylephrine @ 2:10 PM           Slit Lamp and Fundus Exam     External Exam       Right Left   External Normal Normal         Slit Lamp Exam       Right Left   Lids/Lashes Normal Normal   Conjunctiva/Sclera White and quiet White and quiet   Cornea Clear Clear   Anterior Chamber Deep and quiet Deep and quiet   Iris Round and reactive Round and reactive   Lens Posterior chamber intraocular lens Posterior chamber intraocular lens   Anterior Vitreous Normal Normal         Fundus Exam       Right Left   Posterior Vitreous Posterior vitreous detachment Posterior vitreous detachment   Disc Normal Normal   C/D Ratio 0.3 0.45   Macula Retinal pigment epithelial atrophy, Soft drusen, no exudates, no hemorrhage, Retinal pigment epithelial mottling, Advanced age related macular degeneration, with foveal atrophy Retinal pigment epithelial atrophy, Soft drusen, no exudates, no hemorrhage, Retinal pigment epithelial mottling, no macular thickening, pigmented atrophy, accumulation of subfoveal calcified type drusen apparent clinically, Advanced age related macular degeneration mostly perifoveal atrophy   Vessels Normal Normal   Periphery Normal Normal            IMAGING AND PROCEDURES  Imaging and Procedures for 09/21/21  OCT, Retina - OU - Both Eyes       Right Eye Quality was good. Scan locations included subfoveal. Central Foveal Thickness: 227. Progression has been stable. Findings include abnormal foveal contour, retinal drusen .   Left Eye Quality was good. Scan locations included subfoveal. Central Foveal Thickness: 246. Progression has been stable. Findings include abnormal foveal contour, retinal drusen .   Notes No signs of CNVM OU, no change, with outer retinal atrophy documented with loss of the outer photoreceptor layer as  well as structural retina in the foveal region accounting for the significant decline in vision in the right eye from 20/100 to now 20/200.  There is no sign of wet AMD thus there is no sign of treatable lesion at this time in either eye      Color Fundus Photography Optos - OU - Both Eyes       Right Eye Progression has been stable. Disc findings include normal observations. Macula : drusen, geographic atrophy. Vessels : normal observations. Periphery : normal observations.   Left Eye Progression has been stable. Disc findings include normal observations. Macula : drusen, geographic atrophy. Vessels : normal observations. Periphery : normal observations.   Notes Subfoveal geographic atrophy document on color fundus photography as well as on clinical examination.  No signs of wet AMD OU.  OS with incidental note of small choroidal nevus in the posterior pole no impact on acuity  Incidental posterior vitreous detachment also noted on color fundus photography              ASSESSMENT/PLAN:  Advanced nonexudative age-related  macular degeneration of left eye without subfoveal involvement Extensive perifoveal atrophy.  Past OS had large drusenoid deposits, see OCT 2021, which have now resolved into atrophy but intact acuity  Advanced nonexudative age-related macular degeneration of right eye with subfoveal involvement Central foveal atrophy is a resolution process of large subfoveal drusenoid pigmentary and drusen deposits from 2021     ICD-10-CM   1. Intermediate stage nonexudative age-related macular degeneration of both eyes  H35.3132 OCT, Retina - OU - Both Eyes    Color Fundus Photography Optos - OU - Both Eyes    2. Advanced nonexudative age-related macular degeneration of left eye without subfoveal involvement  H35.3123     3. Advanced nonexudative age-related macular degeneration of right eye with subfoveal involvement  H35.3114       1.  OU no signs of wet  AMD.  2.  Intermediate  ARMD OU from 2021 with drusenoid deposits have now resolved into subfoveal atrophy geographic OD in the left eye with perifoveal atrophy and intact acuity.  3.  I have discussed with the patient the use of Syfovre.  I would recommend consideration of Syfovre OD first and if well-tolerated with no's extensive side effects may launch into treatment left eye due to the perifoveal atrophy in order to slow her and protect the left eye from developing extensive geographic atrophy centrally  Ophthalmic Meds Ordered this visit:  No orders of the defined types were placed in this encounter.      Return in about 2 months (around 11/21/2021) for DILATE OU, COLOR FP, OCT, consider Syfovre OD first.  There are no Patient Instructions on file for this visit.   Explained the diagnoses, plan, and follow up with the patient and they expressed understanding.  Patient expressed understanding of the importance of proper follow up care.   Clent Demark Ameliarose Shark M.D. Diseases & Surgery of the Retina and Vitreous Retina & Diabetic [place] 09/21/21     Abbreviations: M myopia (nearsighted); A astigmatism; H hyperopia (farsighted); P presbyopia; Mrx spectacle prescription;  CTL contact lenses; OD right eye; OS left eye; OU both eyes  XT exotropia; ET esotropia; PEK punctate epithelial keratitis; PEE punctate epithelial erosions; DES dry eye syndrome; MGD meibomian gland dysfunction; ATs artificial tears; PFAT's preservative free artificial tears; [place] nuclear sclerotic cataract; PSC posterior subcapsular cataract; ERM epi-retinal membrane; PVD posterior vitreous detachment; RD retinal detachment; DM diabetes mellitus; DR diabetic retinopathy; NPDR non-proliferative diabetic retinopathy; PDR proliferative diabetic retinopathy; CSME clinically significant macular edema; DME diabetic macular edema; dbh dot blot hemorrhages; CWS cotton wool spot; POAG primary open angle glaucoma; C/D cup-to-disc  ratio; HVF humphrey visual field; GVF goldmann visual field; OCT optical coherence tomography; IOP intraocular pressure; BRVO Branch retinal vein occlusion; CRVO central retinal vein occlusion; CRAO central retinal artery occlusion; BRAO branch retinal artery occlusion; RT retinal tear; SB scleral buckle; PPV pars plana vitrectomy; VH Vitreous hemorrhage; PRP panretinal laser photocoagulation; IVK intravitreal kenalog; VMT vitreomacular traction; MH Macular hole;  NVD neovascularization of the disc; NVE neovascularization elsewhere; AREDS age related eye disease study; ARMD age related macular degeneration; POAG primary open angle glaucoma; EBMD epithelial/anterior basement membrane dystrophy; ACIOL anterior chamber intraocular lens; IOL intraocular lens; PCIOL posterior chamber intraocular lens; Phaco/IOL phacoemulsification with intraocular lens placement; [place] photorefractive keratectomy; LASIK laser assisted in situ keratomileusis; HTN hypertension; DM diabetes mellitus; COPD chronic obstructive pulmonary disease

## 2021-09-23 ENCOUNTER — Telehealth: Payer: Self-pay | Admitting: Neurology

## 2021-09-23 NOTE — Telephone Encounter (Signed)
CPAP Titration- Medicare/commerical no auth req spoke to [place] ref # [id number].  Patient is scheduled at [facility] for 10/12/21 at 9 pm.  Mailed packet to the patient.

## 2021-09-29 DIAGNOSIS — Z13220 Encounter for screening for lipoid disorders: Secondary | ICD-10-CM | POA: Diagnosis not present

## 2021-09-29 DIAGNOSIS — E039 Hypothyroidism, unspecified: Secondary | ICD-10-CM | POA: Diagnosis not present

## 2021-10-06 DIAGNOSIS — E039 Hypothyroidism, unspecified: Secondary | ICD-10-CM | POA: Diagnosis not present

## 2021-10-12 ENCOUNTER — Ambulatory Visit (INDEPENDENT_AMBULATORY_CARE_PROVIDER_SITE_OTHER): Payer: Medicare Other | Admitting: Neurology

## 2021-10-12 DIAGNOSIS — Z789 Other specified health status: Secondary | ICD-10-CM

## 2021-10-12 DIAGNOSIS — F3341 Major depressive disorder, recurrent, in partial remission: Secondary | ICD-10-CM

## 2021-10-12 DIAGNOSIS — G4733 Obstructive sleep apnea (adult) (pediatric): Secondary | ICD-10-CM

## 2021-10-16 DIAGNOSIS — Z789 Other specified health status: Secondary | ICD-10-CM | POA: Insufficient documentation

## 2021-10-16 DIAGNOSIS — G4733 Obstructive sleep apnea (adult) (pediatric): Secondary | ICD-10-CM | POA: Insufficient documentation

## 2021-10-16 MED ORDER — BUPROPION HCL 100 MG PO TABS: 100.0000 mg | ORAL_TABLET | Freq: Two times a day (BID) | ORAL | Status: DC

## 2021-10-16 NOTE — Procedures (Signed)
[facility]  CPAP TITRATION REPORT   STUDY DATE:  10/12/2021     PATIENT NAME:  Fedor Kazmierski. Acrey         DATE OF BIRTH:  [birth year]  PATIENT ID:  [id number]    TYPE OF STUDY:  CPAP  READING PHYSICIAN: Larey Seat, MD REFERRED BY: Dr Veleta Miners, MD and Deloria Lair, MD is following. SCORING TECHNICIAN: Forde Radon, RPSGT   HISTORY: Nuri Larmer. Woon. This 88 year old widowed [place] gentleman resides at [facility] and returned to the sleep clinic after a HST 02-15-2021 resulted with an AHI of 27.6/h. The REM sleep AHI was 35.8/h and supine AHI was 33.8/h, 25 minutes of hypoxia, bradycardia were recorded as well.  Patient used his own CPAP without success, which had been set up in 2018 with a set 9 cm H20 pressure and 3 cm EPR. high air leaks were present, a mask fit will be needed as well. Residual AHI was 9.8/h. here to get re-titrated and re-fitted to a new interface (trying a non-FFM), and to be checked for any hypoxia remaining.  ADDITIONAL INFORMATION:  The Epworth Sleepiness Scale endorsed at 1 /24 points (scores above or equal to 10 are suggestive of hypersomnolence). FSS endorsed at   /63 points.  Height: 71 in Weight: 167 lbs (BMI 23) Neck Size: 15 in  MEDICATIONS: Wellbutrin, Vitamin D, Cranberry, Vitamin J67, Garlic, Zestril, Turmeric, Resveratrol, Armour  CPAP was initiated again with a FFM, based on a concern of oral venting by attending tech. who chose a medium size Simplus FFM for Mr. Heber.  Titration began at 5 cm water and ended at 11 cm water pressure, and all settings allowed for an AHI < 10/h. the patient slept at the last explored setting of 11 cm water for 94 minutes and reached an AHI of 5.1/h- REM sleep had rebounded at 9 cm water and some sleep was in supine position. There was a radical shift from frequent PLMs to none after the 9 cm water pressure mark was reached.  TECHNICAL DESCRIPTION: A registered sleep technologist ( RPSGT)   was in attendance for the duration of the recording.  Data collection, scoring, video monitoring, and reporting were performed in compliance with the CMS Manual for the Scoring of Sleep and Associated Events; (using a 4% oxygen desaturation rule or Hypopnea is scored based on the criteria listed in Section VIII D. 1a in the AASM Manual V2.6 using 4% oxygen desaturation and /or arousal rule).   SLEEP CONTINUITY AND SLEEP ARCHITECTURE:  Lights-out was at 21:40: and lights-on at  05:00:, with  7.3 hours of recording time . Total sleep time ( TST) was 368.0 minutes with a decreased sleep efficiency at 83.6%. Sleep latency was increased at 54.0 minutes.  REM sleep latency was normal at 70.5 minutes. Of the total sleep time, the percentage of stage N1 sleep was 1.4%, stage N2 sleep was 85%, stage N3 sleep was 0.0%, and REM sleep was 13.7%.  BODY POSITION:  TST was divided between the following sleep positions: Supine sleep for 105 minutes (29%), non-supine 263 minutes (71%); right 00 minutes (0%), left 262 minutes (71%), and prone 00 minutes (0%). Total supine REM sleep time was 00 minutes (0% of total REM sleep). There were 2 Stage R periods observed on this study night, 12 awakenings (i.e. transitions to Stage W from any sleep stage), and 31 total stage transitions. Wake after sleep onset (WASO) time accounted for 17.5 minutes.   RESPIRATORY  MONITORING:   Based on CMS criteria (using a 4% oxygen desaturation rule for scoring hypopneas), there were 1 apnea (1 obstructive; 0 central; 0 mixed), and 35 hypopneas.  Apnea index was 0.2/h. Hypopnea index was 5.7/h. The AHI ( Apnea-Hypopnea Index) was 5.9/h overall. AHI was 4.0/h supine, 11/h in non-supine;  AHI was 10.7/h in REM and 5/h in NREM). There were 0 respiratory effort-related arousals (RERAs) noted.   OXIMETRY: Oxyhemoglobin Saturation Nadir during sleep was at 75% from a mean of 94%.  Of the Total sleep time (TST), hypoxemia (<89%) was present for 0.9  minutes, or 0.2% of total sleep time.  LIMB MOVEMENTS: There were 304 periodic limb movements of sleep (49.6/h), of which none were associated with an arousal. There was a radical shift from frequent PLMs to none after the 9 cm water pressure mark was reached.  AROUSAL: There were 29 arousals in total, for an arousal index of 3 arousals/hour.  Of these, 5 were identified as respiratory-related arousals (1 /h), 0 were PLM-related arousals (0 /h), and 24 were non-specific arousals (4 /h). There were 0 occurrences of Cheyne Stokes breathing.  Snoring was classified as controlled. EEG:  PSG EEG was of normal amplitude and frequency, with symmetric manifestation of sleep stages. EKG: The electrocardiogram documented arrythmia with variable R to R intervals and bradycardia.  The average heart rate during sleep was 58 bpm.  The heart rate during sleep varied between a minimum of 50 and  a maximum of  77 bpm. AUDIO and VIDEO: No abnormal complex movements, periodic or myoclonic movements, nor vocalization in sleep.   IMPRESSION: 1) Sleep disordered breathing was controlled and hypoxia was alleviated under 11 cm water pressure CPAP. No snoring was recorded during this titration.     2) Periodic limb movements were very frequently noted, but they did not cause arousals. There was a radical shift from frequent PLMs to none after the 9 cm water pressure mark was reached.  3) Total sleep time was within normal limits at 368.0 minutes.  Sleep efficiency was slightly decreased at 83.6% due to prolonged latency.  Sleep fragmentation was not present.   RECOMMENDATIONS: I like for Mr. Vinluan to be fitted for an interface he likes and finds comfortable. If the medium size SIMPLUS FFM is that interface, he is welcome to it.  I also want to set his new machine, which will be due in December 2023 to be an autotitration device by [employer] with a pressure window from 5 through 13 cm water, 2 cm EPR and heated  humidification. RV after 30 days and before 90 day-mark on the new therapy with me or NP in our sleep clinic.   Larey Seat, MD

## 2021-10-16 NOTE — Addendum Note (Signed)
Addended by: Larey Seat on: 10/16/2021 02:10 PM   Modules accepted: Orders

## 2021-10-18 ENCOUNTER — Encounter: Payer: Self-pay | Admitting: Neurology

## 2021-10-19 NOTE — Telephone Encounter (Signed)
I called pt. I advised pt that Dr. Brett Fairy reviewed their sleep study results and found that pt tolerated CPAP well. Dr. Brett Fairy recommends that pt starts auto cpap. I reviewed PAP compliance expectations with the pt. Pt is agreeable to starting a CPAP. I advised pt that an order will be sent to a DME, [facility], and [facility] will call the pt within about one week after they file with the pt's insurance. [facility] will show the pt how to use the machine, fit for masks, and troubleshoot the CPAP if needed. A follow up appt was made for insurance purposes with Debbora Presto, NP on 01/03/2022 at 3:30 pm. Pt verbalized understanding to arrive 15 minutes early and bring their CPAP. A letter with all of this information in it will be mailed to the pt as a reminder. I verified with the pt that the address we have on file is correct. Pt verbalized understanding of results. Pt had no questions at this time but was encouraged to call back if questions arise. I have sent the order to [facility] and have received confirmation that they have received the order.

## 2021-10-26 ENCOUNTER — Telehealth: Payer: Self-pay | Admitting: *Deleted

## 2021-10-26 NOTE — Telephone Encounter (Signed)
Patient stated that he wanted to let you know that he is feeling weak and tired. Stated that he feels like something is going on. Stated that this is not is age. No other symptoms noted.   Doesn't want to see Alyse Low  Scheduled an appointment with Dr. Lyndel Safe for 11/02/2021

## 2021-10-29 DIAGNOSIS — H524 Presbyopia: Secondary | ICD-10-CM | POA: Diagnosis not present

## 2021-10-29 DIAGNOSIS — H353134 Nonexudative age-related macular degeneration, bilateral, advanced atrophic with subfoveal involvement: Secondary | ICD-10-CM | POA: Diagnosis not present

## 2021-10-29 DIAGNOSIS — H26492 Other secondary cataract, left eye: Secondary | ICD-10-CM | POA: Diagnosis not present

## 2021-10-29 DIAGNOSIS — H18513 Endothelial corneal dystrophy, bilateral: Secondary | ICD-10-CM | POA: Diagnosis not present

## 2021-10-29 DIAGNOSIS — H52203 Unspecified astigmatism, bilateral: Secondary | ICD-10-CM | POA: Diagnosis not present

## 2021-11-02 ENCOUNTER — Non-Acute Institutional Stay: Payer: Medicare Other | Admitting: Internal Medicine

## 2021-11-02 ENCOUNTER — Encounter: Payer: Self-pay | Admitting: Internal Medicine

## 2021-11-02 VITALS — BP 122/78 | HR 74 | Temp 97.6°F | Resp 18 | Ht 71.0 in | Wt 164.2 lb

## 2021-11-02 DIAGNOSIS — R5383 Other fatigue: Secondary | ICD-10-CM | POA: Diagnosis not present

## 2021-11-02 DIAGNOSIS — N1831 Chronic kidney disease, stage 3a: Secondary | ICD-10-CM

## 2021-11-02 DIAGNOSIS — R42 Dizziness and giddiness: Secondary | ICD-10-CM

## 2021-11-02 DIAGNOSIS — I10 Essential (primary) hypertension: Secondary | ICD-10-CM

## 2021-11-03 NOTE — Progress Notes (Addendum)
Location: [place] of Service:  Clinic (12)  Provider:   Code Status:  Goals of Care:     11/02/2021    2:49 PM  Advanced Directives  Does Patient Have a Medical Advance Directive? Yes  Type of Paramedic of [place];Living will;Out of facility DNR (pink MOST or yellow form)  Does patient want to make changes to medical advance directive? No - Patient declined  Copy of [place] in Chart? Yes - validated most recent copy scanned in chart (See row information)     Chief Complaint  Patient presents with   Medical Management of Chronic Issues    4 month follow up. Patient states he has been a little more tired even while walking 50 yards    Quality Metric Gaps    To discuss need for Zoster, Tdap,Covid and Flu or postpone if patient refuses.     HPI: Patient is a 88 y.o. male seen today for an acute visit for Feeling Fatigue  Lives in [place] in [place]     Acute issue Fatigue Continues to be the issue he states that sometimes he gets up in the morning and his blood pressure is too low and he feels dizzy.  He states his blood pressure fluctuates from systolic  947 to SBP of 80   He sometimes does not take his lisinopril. Denies any shortness of breath or palpitations Sleep apnea Working with sleep clinic to get a new mask.  His apnea is severe Insomnia Has failed trazodone and Remeron Now doing Melatonin But still has Insomnia Depression and anxiety Is [facility] Last his wife 15 months ago On  Wellbutrin   Also Has Past medical  history of   sarcoma of Right lower extremity Follows closely with oncology at [facility] for [facility] scan of his chest for lung nodules. Has been in remission. Recent Imaging has not shown any issues  History of prostate cancer. History of tonsillar cancer Macular degeneration follows with ophthalmologist  Aortitis syndrome.  Follows  with Dr. Dossie Der who sometimes has to use prednisone as needed  B12 deficiency takes injection self administer  Hypothyroid follows with Dr. Chalmers Cater has not seen her recently  CKD follows with Renal  Past Medical History:  Diagnosis Date   Age-related macular degeneration, dry, both eyes    Aortitis syndrome ([place]) rheumotologist-  dr syed   IgG4 syndrome--  (effects abdomine) ,  previously long term use prednisone last taken 07/ 2021   Basal cell carcinoma (BCC) of eye    CKD (chronic kidney disease), stage III [facility])    nephrologist-- dr Johnette Abraham. [name]   Complication of anesthesia    post op acute urinary retention   DDD (degenerative disc disease)    Fatigue    pt evalulation by cardiology--- dr Fransico Michael, note 05-21-2019 in epic,(non cardiac)  normal echo and event monitor showed extra beats of atrial tachycardia , no atrial fib, and rare ecotpy   Full dentures    GERD (gastroesophageal reflux disease)    History of basal cell carcinoma ([place]) excision    12-25-2018 MOH's reconstruction left lower eyelid   History of external beam radiation therapy 2002   prostate cancer  and boost with radioative prostate seed implants   History of prostate cancer DX  2002   S/P EXTERNAL RADIATION/ RADIOACTIVE SEED IMPLANTS  2003   History of squamous cell carcinoma excision    2012--  RIGHT LOWER EXTREM;  2019 LEFT LOWER EXTREMITIY   HTN (hypertension)    followed by pcp   Hyperlipidemia    Hypothyroidism    followed by pcp--- acquired atrophy due to radiation;  previously seen by endocrinologist-  dr Chalmers Cater--- per pt takes his own thyroid med. called NP Thyroid 90 mg daily, does not take synthroid   Large vessel vasculitis ([place])    rheumotologist-  dr syed --- IgG4 related aoritis involving renal arteries ,  pt treated with predisone.  (09-06-2019  per pt no longer prednisone last taken one month ago 07/ 2021, weaned off by pcp per pt request)   Left renal artery stenosis [facility])    nephrologist---  dr [name]--- with aneurysm proximal    Lymphedema of left leg    followed by [facility]---- pt stated uses compression hose   OSA (obstructive sleep apnea) 09-06-2019  per pt lasted used cpap approx. 2019, stated felt he no longer needed    pt retested 11-13-2016 at [facility] w/ dr ather-- moderate to severe OSA , AHI 16.4/h/  03-24-2017    Pulmonary nodule followed by [facility]   per CT 06-11-2019  in [place] in epic done at [place],  LUL nodule , not mets   Renal cyst, right    Restless legs syndrome (RLS)    S/P radiation therapy     for tonsillar cancer (head and neck) completed 01-26-2012 at [facility]);   and XRT for right lower leg sarcoma --- completed 06-06-2017   Saliva decreased    Sarcoma of lower extremity, right ([place]) oncolgoist-  dr Juluis Rainier ([facility]) & Dr [name] ([facility])   dx 02-14-2017 w/ needle core bx;  high grade pleomorphic spindle cell sarcoma , grade 2 (cT2N0M0); 03-15-2017  radical resection sarcoma tumor right lower leg  and completed radiation 06-06-2017   Self-catheterizes urinary bladder    QID   AND   PRN   Tonsillar cancer ([place]) 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+) (Stage IVA)---- dx oct 2013  ----s/p left tonsillectomy and concurent chemo and radiation/  ended 01-26-2012-- no surgical intervention---  residuals ( dry mouth, decreased saliva)   oncologist at [place]--  dr brizel--  HX OF -- NO RECURRENCE   Urethral false passage 07/2019   Urethral stricture urologist--- dr Jeffie Pollock   chronic---  post urethral dilation's   Urinary retention with incomplete bladder emptying    Vitamin B 12 deficiency 01/28/2011   Vitamin D deficiency     Past Surgical History:  Procedure Laterality Date   BALLOON DILATION N/A 11/10/2014   Procedure: CYSTO BALLOON DILATION AND RETROGRADE URETHROGRAM ;  Surgeon: Bjorn Loser, MD;  Location[facility];  Service: Urology;  Laterality: N/A;    CATARACT EXTRACTION W/ INTRAOCULAR LENS  IMPLANT, BILATERAL  2012  approx   CYSTO/ BALLOON DILATION OF  URETHRAL STRICTURE  12-25-2010   CYSTOSCOPY WITH RETROGRADE URETHROGRAM N/A 03/30/2016   Procedure: CYSTOSCOPY WITH RETROGRADE URETHROGRAM AND BALLOON DILATION with cystogram;  Surgeon: Bjorn Loser, MD;  Location: [facility];  Service: Urology;  Laterality: N/A;   CYSTOSCOPY WITH URETHRAL DILATATION  05/31/2011   Procedure: CYSTOSCOPY WITH URETHRAL DILATATION;  Surgeon: Reece Packer, MD;  Location: [place];  Service: Urology;  Laterality: N/A;  BALLOON DILATION   CYSTOSCOPY WITH URETHRAL DILATATION N/A 09/13/2016   Procedure: CYSTOSCOPY WITH URETHRAL BALLOON DILATATION;  Surgeon: Bjorn Loser, MD;  Location: [place];  Service: Urology;  Laterality: N/A;   CYSTOSCOPY WITH URETHRAL DILATATION N/A 03/30/2017   Procedure: CYSTOSCOPY WITH BALLOON URETHRAL DILATATION;  Surgeon: Bjorn Loser, MD;  Location: [place];  Service: Urology;  Laterality: N/A;   CYSTOSCOPY WITH URETHRAL DILATATION N/A 09/12/2019   Procedure: CYSTOSCOPY WITH URETHRAL DILATATION;  Surgeon: Irine Seal, MD;  Location[facility];  Service: Urology;  Laterality: N/A;   CYSTOSCOPY/RETROGRADE/URETEROSCOPY N/A 05/22/2012   Procedure: CYSTOSCOPY BALLOON DILATION RETROGRADE URETEROGRAM ;  Surgeon: Reece Packer, MD;  Location: [place];  Service: Urology;  Laterality: N/A;   CYTSO/  DILATATION URETHRAL STRICTURE/  BX PROSTATIC URETHRA/  REMOVAL FOREIGN BODIES  06-25-2010   [facility]   ECTROPION REPAIR Left 06/18/2019   lower eyelid   INGUINAL HERNIA REPAIR Right 2000   MOHS SURGERY  01/ 2019    [facility]   left lower leg for [facility]   MOHS SURGERY  12/25/2018   for [facility] of left lower eyelid;  the second stage Hughs flap release 01-09-2019   RADICAL RESECTION OF SARCOMA TUMOR   03-15-2017   [facility]   RIGHT LOWER LEG , CALF  AREA   RADIOACTIVE SEED IMPLANTS, PROSTATE  JAN  2003   SKIN LESION EXCISION  07/2012   MOST  right shoulder   TONSILLECTOMY Left 11-10-2011  '@[facility]'$    tonsillar cancer   TRANSURETHRAL INCISION OF BLADDER NECK N/A 09/12/2019   Procedure: TRANSURETHRAL INCISION OF BLADDER NECK;  Surgeon: Irine Seal, MD;  Location: [place];  Service: Urology;  Laterality: N/A;    Allergies  Allergen Reactions   Cefixime Rash   Contrast Media [Iodinated Contrast Media] Other (See Comments)    Avoid due to renal disease   Macrobid [Nitrofurantoin Macrocrystal] Swelling   Nsaids Other (See Comments)    Has been told no NSAIDs, Ibuprofen Renal insufficiency hx.    Cephalosporins Rash    Outpatient Encounter Medications as of 11/02/2021  Medication Sig   buPROPion (WELLBUTRIN) 100 MG tablet Take 1 tablet (100 mg total) by mouth 2 (two) times daily.   Cholecalciferol (VITAMIN D) 50 MCG (2000 UT) CAPS Take by mouth every other day.   Cranberry-Vitamin C (AZO CRANBERRY URINARY TRACT PO) Take by mouth.   cyanocobalamin (,VITAMIN B-12,) 1000 MCG/ML injection Inject 1 mL (1,000 mcg total) into the muscle every 14 (fourteen) days.   Garlic (GARLIQUE PO) Take by mouth every other day.   lisinopril (ZESTRIL) 10 MG tablet Take 1 tablet by mouth once daily   RESVERATROL PO Take 100 mg by mouth every other day.   SYRINGE-NEEDLE, DISP, 3 ML (BD ECLIPSE SYRINGE/NEEDLE) 25G X 5/8" 3 ML MISC One injection every 14 days   thyroid (ARMOUR) 90 MG tablet Take 90 mg by mouth daily.   TURMERIC CURCUMIN PO Take 2 tablets by mouth daily.    UNABLE TO FIND [place] Bone take once daily   UNABLE TO FIND [place] take 4 tablets once daily   UNABLE TO FIND Comprehensive immune support take 2 tablets once daily   UNABLE TO FIND Modified citrus pectin take 4 tablets once daily   UNABLE TO FIND Celery seed take 2 tablets by mouth once daily   UNABLE TO FIND Maxivision multiple vitamin for eyes   No  facility-administered encounter medications on file as of 11/02/2021.    Review of Systems:  Review of Systems  Constitutional:  Positive for activity change. Negative for appetite change  and unexpected weight change.  HENT: Negative.    Respiratory:  Negative for cough and shortness of breath.   Cardiovascular:  Positive for leg swelling.  Gastrointestinal:  Negative for constipation.  Genitourinary:  Negative for frequency.  Musculoskeletal:  Negative for arthralgias, gait problem and myalgias.  Skin: Negative.  Negative for rash.  Neurological:  Positive for dizziness and weakness.  Psychiatric/Behavioral:  Positive for dysphoric mood. Negative for confusion and sleep disturbance. The patient is nervous/anxious.   All other systems reviewed and are negative.   Health Maintenance  Topic Date Due   Zoster Vaccines- Shingrix (1 of 2) Never done   TETANUS/TDAP  01/17/2013   COVID-19 Vaccine (5 - Moderna risk series) 12/25/2020   INFLUENZA VACCINE  08/17/2021   Pneumonia Vaccine 66+ Years old  Completed   HPV VACCINES  Aged Out    Physical Exam: Vitals:   11/02/21 1451  BP: 122/78  Pulse: 74  Resp: 18  Temp: 97.6 F (36.4 C)  TempSrc: Temporal  SpO2: 97%  Weight: 164 lb 3.2 oz (74.5 kg)  Height: '5\' 11"'$  (1.803 m)   Body mass index is 22.9 kg/m. Physical Exam Vitals reviewed.  Constitutional:      Appearance: Normal appearance.  HENT:     Head: Normocephalic.     Nose: Nose normal.     Mouth/Throat:     Mouth: Mucous membranes are moist.     Pharynx: Oropharynx is clear.  Eyes:     Pupils: Pupils are equal, round, and reactive to light.  Cardiovascular:     Rate and Rhythm: Normal rate and regular rhythm.     Pulses: Normal pulses.     Heart sounds: No murmur heard. Pulmonary:     Effort: Pulmonary effort is normal. No respiratory distress.     Breath sounds: Normal breath sounds. No rales.  Abdominal:     General: Abdomen is flat. Bowel sounds are normal.      Palpations: Abdomen is soft.  Musculoskeletal:     Cervical back: Neck supple.     Comments: Swelling in his Left Leg which is chronic  Skin:    General: Skin is warm.  Neurological:     General: No focal deficit present.     Mental Status: He is alert and oriented to person, place, and time.  Psychiatric:        Mood and Affect: Mood normal.        Thought Content: Thought content normal.     Labs reviewed: Basic Metabolic Panel: Recent Labs    03/17/21 0000 03/23/21 0000 07/29/21 0000  NA 140 140 142  K 5.0 4.8 4.7  CL 103 104 105  CO2 27* 26* 23*  BUN 33* 33* 31*  CREATININE 1.5* 1.4* 1.7*  CALCIUM 8.9 9.0 9.0  TSH  --   --  0.97   Liver Function Tests: Recent Labs    03/17/21 0000  ALBUMIN 4.0   No results for input(s): "LIPASE", "AMYLASE" in the last 8760 hours. No results for input(s): "AMMONIA" in the last 8760 hours. CBC: Recent Labs    03/23/21 0000 07/29/21 0000  WBC 4.1 4.3  HGB 13.4* 13.2*  HCT 39* 39*  PLT 215 224   Lipid Panel: No results for input(s): "CHOL", "HDL", "LDLCALC", "TRIG", "CHOLHDL", "LDLDIRECT" in the last 8760 hours. No results found for: "HGBA1C"  Procedures since last visit: Cpap titration  Result Date: 10/12/2021 Larey Seat, MD     10/16/2021  2:06 PM  [facility]  CPAP TITRATION REPORT STUDY DATE:  10/12/2021  PATIENT NAME:  Layn Kye. Villena        DATE OF BIRTH:  01-14-1933 PATIENT ID:  [id number]    TYPE OF STUDY:  CPAP READING PHYSICIAN: Larey Seat, MD REFERRED BY: Dr Veleta Miners, MD and Deloria Lair, MD is following. SCORING TECHNICIAN: Forde Radon, RPSGT HISTORY: Vihan Santagata. [place]. This 88 year old widowed [place] gentleman resides at [facility] and returned to the sleep clinic after a HST 02-15-2021 resulted with an AHI of 27.6/h. The REM sleep AHI was 35.8/h and supine AHI was 33.8/h, 25 minutes of hypoxia, bradycardia were recorded as well.  Patient used his own CPAP  without success, which had been set up in 2018 with a set 9 cm H20 pressure and 3 cm EPR. high air leaks were present, a mask fit will be needed as well. Residual AHI was 9.8/h. here to get re-titrated and re-fitted to a new interface (trying a non-FFM), and to be checked for any hypoxia remaining. ADDITIONAL INFORMATION:  The Epworth Sleepiness Scale endorsed at 1 /24 points (scores above or equal to 10 are suggestive of hypersomnolence). FSS endorsed at   /63 points.  Height: 71 in Weight: 167 lbs (BMI 23) Neck Size: 15 in MEDICATIONS: Wellbutrin, Vitamin D, Cranberry, Vitamin N39, Garlic, Zestril, Turmeric, Resveratrol, Armour CPAP was initiated again with a FFM, based on a concern of oral venting by attending tech. who chose a medium size Simplus FFM for Mr. Xiang. Titration began at 5 cm water and ended at 11 cm water pressure, and all settings allowed for an AHI < 10/h. the patient slept at the last explored setting of 11 cm water for 94 minutes and reached an AHI of 5.1/h- REM sleep had rebounded at 9 cm water and some sleep was in supine position. There was a radical shift from frequent PLMs to none after the 9 cm water pressure mark was reached. TECHNICAL DESCRIPTION: A registered sleep technologist ( RPSGT)  was in attendance for the duration of the recording.  Data collection, scoring, video monitoring, and reporting were performed in compliance with the CMS Manual for the Scoring of Sleep and Associated Events; (using a 4% oxygen desaturation rule or Hypopnea is scored based on the criteria listed in Section VIII D. 1a in the AASM Manual V2.6 using 4% oxygen desaturation and /or arousal rule). SLEEP CONTINUITY AND SLEEP ARCHITECTURE:  Lights-out was at 21:40: and lights-on at  05:00:, with  7.3 hours of recording time . Total sleep time ( TST) was 368.0 minutes with a decreased sleep efficiency at 83.6%. Sleep latency was increased at 54.0 minutes.  REM sleep latency was normal at 70.5 minutes. Of the  total sleep time, the percentage of stage N1 sleep was 1.4%, stage N2 sleep was 85%, stage N3 sleep was 0.0%, and REM sleep was 13.7%.  BODY POSITION:  TST was divided between the following sleep positions: Supine sleep for 105 minutes (29%), non-supine 263 minutes (71%); right 00 minutes (0%), left 262 minutes (71%), and prone 00 minutes (0%). Total supine REM sleep time was 00 minutes (0% of total REM sleep). There were 2 Stage R periods observed on this study night, 12 awakenings (i.e. transitions to Stage W from any sleep stage), and 31 total stage transitions. Wake after sleep onset (WASO) time accounted for 17.5 minutes. RESPIRATORY MONITORING:  Based on CMS criteria (using a 4% oxygen desaturation rule for scoring hypopneas), there were 1  apnea (1 obstructive; 0 central; 0 mixed), and 35 hypopneas. Apnea index was 0.2/h. Hypopnea index was 5.7/h. The AHI ( Apnea-Hypopnea Index) was 5.9/h overall. AHI was 4.0/h supine, 11/h in non-supine; AHI was 10.7/h in REM and 5/h in NREM). There were 0 respiratory effort-related arousals (RERAs) noted.  OXIMETRY: Oxyhemoglobin Saturation Nadir during sleep was at 75% from a mean of 94%.  Of the Total sleep time (TST), hypoxemia (<89%) was present for 0.9 minutes, or 0.2% of total sleep time. LIMB MOVEMENTS: There were 304 periodic limb movements of sleep (49.6/h), of which none were associated with an arousal. There was a radical shift from frequent PLMs to none after the 9 cm water pressure mark was reached. AROUSAL: There were 29 arousals in total, for an arousal index of 3 arousals/hour.  Of these, 5 were identified as respiratory-related arousals (1 /h), 0 were PLM-related arousals (0 /h), and 24 were non-specific arousals (4 /h). There were 0 occurrences of Cheyne Stokes breathing.  Snoring was classified as controlled. EEG:  PSG EEG was of normal amplitude and frequency, with symmetric manifestation of sleep stages. EKG: The electrocardiogram documented arrythmia  with variable R to R intervals and bradycardia.  The average heart rate during sleep was 58 bpm.  The heart rate during sleep varied between a minimum of 50 and  a maximum of  77 bpm. AUDIO and VIDEO: No abnormal complex movements, periodic or myoclonic movements, nor vocalization in sleep. IMPRESSION: 1) Sleep disordered breathing was controlled and hypoxia was alleviated under 11 cm water pressure CPAP. No snoring was recorded during this titration.   2) Periodic limb movements were very frequently noted, but they did not cause arousals. There was a radical shift from frequent PLMs to none after the 9 cm water pressure mark was reached. 3) Total sleep time was within normal limits at 368.0 minutes.  Sleep efficiency was slightly decreased at 83.6% due to prolonged latency.  Sleep fragmentation was not present. RECOMMENDATIONS: I like for Mr. Shanley to be fitted for an interface he likes and finds comfortable. If the medium size SIMPLUS FFM is that interface, he is welcome to it. I also want to set his new machine, which will be due in December 2023 to be an autotitration device by [employer] with a pressure window from 5 through 13 cm water, 2 cm EPR and heated humidification. RV after 30 days and before 90 day-mark on the new therapy with me or NP in our sleep clinic. Larey Seat, MD      Assessment/Plan 1. Fatigue, unspecified type Check CBC and CMP Can be Depression and Seep Apnea   - Ambulatory referral to Cardiology Possible need Echo ?  2. Essential hypertension Wild Fluctuation per patient   - Ambulatory referral to Cardiology  3. Stage 3b chronic kidney disease ([place]) Does follow with Renal DR [name] Annually Repeat BMP   4. Dizziness with Wide Fluctuation of BP per patient   - Ambulatory referral to Cardiology 5 Moderate episode of recurrent major depressive disorder ([place]) Continue Wellbutrin  Also Talking to Counsellor   6 PTSD (post-traumatic stress  disorder) Continue to work with Counsellor     7 Mixed hyperlipidemia Refuses statin    Hypothyroidism due to acquired atrophy of thyroid TSH normal in 07/23  OSA (obstructive sleep apnea) Moderate to sever on HST     B12 deficiency On Injections history of Tonsillar cancer ([place]) Is going for further work up at [facility]    History  of sarcoma Recent Imaging has been negative for any Recurrence Will take Covid Booster Does not take Flu or Shigles  Labs/tests ordered:  CBC,CMP Next appt:  12/07/2021

## 2021-11-04 DIAGNOSIS — N1831 Chronic kidney disease, stage 3a: Secondary | ICD-10-CM | POA: Diagnosis not present

## 2021-11-04 DIAGNOSIS — R5383 Other fatigue: Secondary | ICD-10-CM | POA: Diagnosis not present

## 2021-11-04 DIAGNOSIS — I10 Essential (primary) hypertension: Secondary | ICD-10-CM | POA: Diagnosis not present

## 2021-11-04 LAB — BASIC METABOLIC PANEL
BUN: 26 — AB (ref 4–21)
CO2: 26 — AB (ref 13–22)
Chloride: 101 (ref 99–108)
Creatinine: 1.5 — AB (ref 0.6–1.3)
Glucose: 93
Potassium: 4.9 mEq/L (ref 3.5–5.1)
Sodium: 139 (ref 137–147)

## 2021-11-04 LAB — CBC AND DIFFERENTIAL
HCT: 40 — AB (ref 41–53)
Hemoglobin: 13.5 (ref 13.5–17.5)
Platelets: 195 10*3/uL (ref 150–400)
WBC: 4.9

## 2021-11-04 LAB — COMPREHENSIVE METABOLIC PANEL
Albumin: 3.8 (ref 3.5–5.0)
Albumin: 3.8 (ref 3.5–5.0)
Calcium: 9.2 (ref 8.7–10.7)
Calcium: 9.2 (ref 8.7–10.7)

## 2021-11-04 LAB — HEPATIC FUNCTION PANEL
ALT: 14 U/L (ref 10–40)
AST: 17 (ref 14–40)
Alkaline Phosphatase: 59 (ref 25–125)

## 2021-11-04 LAB — CBC: RBC: 4.12 (ref 3.87–5.11)

## 2021-11-09 ENCOUNTER — Telehealth: Payer: Self-pay | Admitting: Neurology

## 2021-11-09 NOTE — Telephone Encounter (Signed)
Pt called wanting to make sure that [facility] is informed of which mask he will be needing. Pt said that the mask he used in his sleep study worked very well and would like to use that with his cpap machine. Please advise.

## 2021-11-10 NOTE — Telephone Encounter (Signed)
Called pt. He was able to speak with [facility] last night and set up appt for this morning at 10am to get mask he used during sleep study. Nothing further needed.

## 2021-11-17 DIAGNOSIS — Z23 Encounter for immunization: Secondary | ICD-10-CM | POA: Diagnosis not present

## 2021-11-22 ENCOUNTER — Encounter (INDEPENDENT_AMBULATORY_CARE_PROVIDER_SITE_OTHER): Payer: Medicare Other | Admitting: Ophthalmology

## 2021-11-22 DIAGNOSIS — H353123 Nonexudative age-related macular degeneration, left eye, advanced atrophic without subfoveal involvement: Secondary | ICD-10-CM | POA: Diagnosis not present

## 2021-11-22 DIAGNOSIS — H353114 Nonexudative age-related macular degeneration, right eye, advanced atrophic with subfoveal involvement: Secondary | ICD-10-CM | POA: Diagnosis not present

## 2021-11-22 DIAGNOSIS — H43813 Vitreous degeneration, bilateral: Secondary | ICD-10-CM | POA: Diagnosis not present

## 2021-11-22 DIAGNOSIS — G4733 Obstructive sleep apnea (adult) (pediatric): Secondary | ICD-10-CM | POA: Diagnosis not present

## 2021-11-22 DIAGNOSIS — D3132 Benign neoplasm of left choroid: Secondary | ICD-10-CM | POA: Diagnosis not present

## 2021-11-24 ENCOUNTER — Encounter: Payer: Medicare Other | Admitting: Internal Medicine

## 2021-12-01 DIAGNOSIS — L570 Actinic keratosis: Secondary | ICD-10-CM | POA: Diagnosis not present

## 2021-12-01 DIAGNOSIS — L814 Other melanin hyperpigmentation: Secondary | ICD-10-CM | POA: Diagnosis not present

## 2021-12-01 DIAGNOSIS — L988 Other specified disorders of the skin and subcutaneous tissue: Secondary | ICD-10-CM | POA: Diagnosis not present

## 2021-12-01 DIAGNOSIS — L820 Inflamed seborrheic keratosis: Secondary | ICD-10-CM | POA: Diagnosis not present

## 2021-12-01 DIAGNOSIS — L821 Other seborrheic keratosis: Secondary | ICD-10-CM | POA: Diagnosis not present

## 2021-12-01 DIAGNOSIS — L218 Other seborrheic dermatitis: Secondary | ICD-10-CM | POA: Diagnosis not present

## 2021-12-06 DIAGNOSIS — E039 Hypothyroidism, unspecified: Secondary | ICD-10-CM | POA: Diagnosis not present

## 2021-12-07 ENCOUNTER — Non-Acute Institutional Stay: Payer: Medicare Other | Admitting: Internal Medicine

## 2021-12-07 ENCOUNTER — Encounter: Payer: Self-pay | Admitting: Internal Medicine

## 2021-12-07 VITALS — BP 134/82 | HR 81 | Temp 97.7°F | Resp 17 | Ht 71.0 in | Wt 166.2 lb

## 2021-12-07 DIAGNOSIS — G4733 Obstructive sleep apnea (adult) (pediatric): Secondary | ICD-10-CM

## 2021-12-07 DIAGNOSIS — R5383 Other fatigue: Secondary | ICD-10-CM

## 2021-12-07 DIAGNOSIS — I10 Essential (primary) hypertension: Secondary | ICD-10-CM | POA: Diagnosis not present

## 2021-12-07 DIAGNOSIS — E538 Deficiency of other specified B group vitamins: Secondary | ICD-10-CM

## 2021-12-07 DIAGNOSIS — F3341 Major depressive disorder, recurrent, in partial remission: Secondary | ICD-10-CM | POA: Diagnosis not present

## 2021-12-07 DIAGNOSIS — N1832 Chronic kidney disease, stage 3b: Secondary | ICD-10-CM

## 2021-12-07 NOTE — Progress Notes (Signed)
Location: [place] of Service:  Clinic (12)  Provider:   Code Status:  Goals of Care:     12/07/2021    1:36 PM  Advanced Directives  Does Patient Have a Medical Advance Directive? Yes  Type of Paramed[place] of [place];Living will;Out of facility DNR (pink MOST or yellow form)  Does patient want to make changes to medical advance directive? No - Patient declined  Copy of [place] in Chart? No - copy requested     Chief Complaint  Patient presents with   Medical Management of Chronic Issues    4 week follow up   Quality Metric Gaps    Discuss the need for AWV     HPI: Patient is a 88 y.o. male seen today for an acute visit for Follow up of his labs and Fatigue  Lives in [place] in [place]   Acute issue Fatigue Labs done were all normal He says it has improved since he got his mask fixed and sleeping better Sleep apnea and Insomnia Improved and doing well now  Depression and anxiety [facility] Lost his wife 15 months ago On  Wellbutrin     Also Has Past medical  history of    sarcoma of Right lower extremity Follows closely with oncology at [facility] for CT scan of his chest for lung nodules. Has been in remission. Recent Imaging has not shown any issues   History of prostate cancer. History of tonsillar cancer Macular degeneration follows with ophthalmologist   Aortitis syndrome.  Follows with Dr. Dossie Der who sometimes has to use prednisone as needed   B12 deficiency takes injection self administer   Hypothyroid follows with Dr. Chalmers Cater has not seen her recently   CKD follows with Renal  Past Medical History:  Diagnosis Date   Age-related macular degeneration, dry, both eyes    Aortitis syndrome ([place]) rheumotologist-  dr syed   IgG4 syndrome--  (effects abdomine) ,  previously long term use prednisone last taken 07/ 2021   Basal cell carcinoma (BCC)  of eye    CKD (chronic kidney disease), stage III [facility])    nephrologist-- dr Johnette Abraham. [name]   Complication of anesthesia    post op acute urinary retention   DDD (degenerative disc disease)    Fatigue    pt evalulation by cardiology--- dr Fransico Michael, note 05-21-2019 in epic,(non cardiac)  normal echo and event monitor showed extra beats of atrial tachycardia , no atrial fib, and rare ecotpy   Full dentures    GERD (gastroesophageal reflux disease)    History of basal cell carcinoma ([place]) excision    12-25-2018 MOH's reconstruction left lower eyelid   History of external beam radiation therapy 2002   prostate cancer  and boost with radioative prostate seed implants   History of prostate cancer DX  2002   S/P EXTERNAL RADIATION/ RADIOACTIVE SEED IMPLANTS  2003   History of squamous cell carcinoma excision    2012--  RIGHT LOWER EXTREM;  2019 LEFT LOWER EXTREMITIY   HTN (hypertension)    followed by pcp   Hyperlipidemia    Hypothyroidism    followed by pcp--- acquired atrophy due to radiation;  previously seen by endocrinologist-  dr Chalmers Cater--- per pt takes his own thyroid med. called NP Thyroid 90 mg daily, does not take synthroid   Large vessel vasculitis ([place])    rheumotologist-  dr syed --- IgG4 related  aoritis involving renal arteries ,  pt treated with predisone.  (09-06-2019  per pt no longer prednisone last taken one month ago 07/ 2021, weaned off by pcp per pt request)   Left renal artery stenosis [facility])    nephrologist--- dr [name]--- with aneurysm proximal    Lymphedema of left leg    followed by [facility]---- pt stated uses compression hose   OSA (obstructive sleep apnea) 09-06-2019  per pt lasted used cpap approx. 2019, stated felt he no longer needed    pt retested 11-13-2016 at [facility] w/ dr ather-- moderate to severe OSA , AHI 16.4/h/  03-24-2017    Pulmonary nodule followed by [facility]   per CT 06-11-2019  in [place] in epic done at [place],   LUL nodule , not mets   Renal cyst, right    Restless legs syndrome (RLS)    S/P radiation therapy     for tonsillar cancer (head and neck) completed 01-26-2012 at [facility]   and XRT for right lower leg sarcoma --- completed 06-06-2017   Saliva decreased    Sarcoma of lower extremity, right ([place]) oncolgoist-  dr Juluis Rainier ([facility]) & Dr [name] ([facility])   dx 02-14-2017 w/ needle core bx;  high grade pleomorphic spindle cell sarcoma , grade 2 (cT2N0M0); 03-15-2017  radical resection sarcoma tumor right lower leg  and completed radiation 06-06-2017   Self-catheterizes urinary bladder    QID   AND   PRN   Tonsillar cancer ([place]) 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+) (Stage IVA)---- dx oct 2013  ----s/p left tonsillectomy and concurent chemo and radiation/  ended 01-26-2012-- no surgical intervention---  residuals ( dry mouth, decreased saliva)   oncologist at [facility]--  dr brizel--  HX OF -- NO RECURRENCE   Urethral false passage 07/2019   Urethral stricture urologist--- dr Jeffie Pollock   chronic---  post urethral dilation's   Urinary retention with incomplete bladder emptying    Vitamin B 12 deficiency 01/28/2011   Vitamin D deficiency     Past Surgical History:  Procedure Laterality Date   BALLOON DILATION N/A 11/10/2014   Procedure: CYSTO BALLOON DILATION AND RETROGRADE URETHROGRAM ;  Surgeon: Bjorn Loser, MD;  Location[facility];  Service: Urology;  Laterality: N/A;   CATARACT EXTRACTION W/ INTRAOCULAR LENS  IMPLANT, BILATERAL  2012  approx   CYSTO/ BALLOON DILATION OF  URETHRAL STRICTURE  12-25-2010   CYSTOSCOPY WITH RETROGRADE URETHROGRAM N/A 03/30/2016   Procedure: CYSTOSCOPY WITH RETROGRADE URETHROGRAM AND BALLOON DILATION with cystogram;  Surgeon: Bjorn Loser, MD;  Location: [facility];  Service: Urology;  Laterality: N/A;   CYSTOSCOPY WITH URETHRAL DILATATION  05/31/2011   Procedure: CYSTOSCOPY WITH  URETHRAL DILATATION;  Surgeon: Reece Packer, MD;  Location: [place];  Service: Urology;  Laterality: N/A;  BALLOON DILATION   CYSTOSCOPY WITH URETHRAL DILATATION N/A 09/13/2016   Procedure: CYSTOSCOPY WITH URETHRAL BALLOON DILATATION;  Surgeon: Bjorn Loser, MD;  Location: [place];  Service: Urology;  Laterality: N/A;   CYSTOSCOPY WITH URETHRAL DILATATION N/A 03/30/2017   Procedure: CYSTOSCOPY WITH BALLOON URETHRAL DILATATION;  Surgeon: Bjorn Loser, MD;  Location: [place];  Service: Urology;  Laterality: N/A;   CYSTOSCOPY WITH URETHRAL DILATATION N/A 09/12/2019   Procedure: CYSTOSCOPY WITH URETHRAL DILATATION;  Surgeon: Irine Seal, MD;  Location: [facility];  Service: Urology;  Laterality: N/A;  CYSTOSCOPY/RETROGRADE/URETEROSCOPY N/A 05/22/2012   Procedure: CYSTOSCOPY BALLOON DILATION RETROGRADE URETEROGRAM ;  Surgeon: Reece Packer, MD;  Location: [facility];  Service: Urology;  Laterality: N/A;   CYTSO/  DILATATION URETHRAL STRICTURE/  BX PROSTATIC URETHRA/  REMOVAL FOREIGN BODIES  06-25-2010   [facility]   ECTROPION REPAIR Left 06/18/2019   lower eyelid   INGUINAL HERNIA REPAIR Right 2000   MOHS SURGERY  01/ 2019    [facility]   left lower leg for [facility]   MOHS SURGERY  12/25/2018   for [facility] of left lower eyelid;  the second stage Hughs flap release 01-09-2019   RADICAL RESECTION OF SARCOMA TUMOR   03-15-2017   [facility]   RIGHT LOWER LEG , CALF AREA   RADIOACTIVE SEED IMPLANTS, PROSTATE  JAN  2003   SKIN LESION EXCISION  07/2012   MOST  right shoulder   TONSILLECTOMY Left 11-10-2011  '@[facility]'$    tonsillar cancer   TRANSURETHRAL INCISION OF BLADDER NECK N/A 09/12/2019   Procedure: TRANSURETHRAL INCISION OF BLADDER NECK;  Surgeon: Irine Seal, MD;  Location: [place];  Service: Urology;  Laterality: N/A;    Allergies  Allergen Reactions   Cefixime Rash   Contrast Media [Iodinated  Contrast Media] Other (See Comments)    Avoid due to renal disease   Macrobid [Nitrofurantoin Macrocrystal] Swelling   Nsaids Other (See Comments)    Has been told no NSAIDs, Ibuprofen Renal insufficiency hx.    Cephalosporins Rash    Outpatient Encounter Medications as of 12/07/2021  Medication Sig   buPROPion (WELLBUTRIN) 100 MG tablet Take 1 tablet (100 mg total) by mouth 2 (two) times daily.   Cholecalciferol (VITAMIN D) 50 MCG (2000 UT) CAPS Take by mouth every other day.   Cranberry-Vitamin C (AZO CRANBERRY URINARY TRACT PO) Take by mouth.   cyanocobalamin (,VITAMIN B-12,) 1000 MCG/ML injection Inject 1 mL (1,000 mcg total) into the muscle every 14 (fourteen) days.   Garlic (GARLIQUE PO) Take by mouth every other day.   lisinopril (ZESTRIL) 10 MG tablet Take 1 tablet by mouth once daily   RESVERATROL PO Take 100 mg by mouth every other day.   SYRINGE-NEEDLE, DISP, 3 ML (BD ECLIPSE SYRINGE/NEEDLE) 25G X 5/8" 3 ML MISC One injection every 14 days   thyroid (ARMOUR) 90 MG tablet Take 90 mg by mouth daily.   TURMERIC CURCUMIN PO Take 2 tablets by mouth daily.    UNABLE TO FIND [place] take once daily   UNABLE TO FIND [place] take 4 tablets once daily   UNABLE TO FIND Comprehensive immune support take 2 tablets once daily   UNABLE TO FIND Modified citrus pectin take 4 tablets once daily   UNABLE TO FIND Celery seed take 2 tablets by mouth once daily   UNABLE TO FIND Maxivision multiple vitamin for eyes   No facility-administered encounter medications on file as of 12/07/2021.    Review of Systems:  Review of Systems  Constitutional:  Negative for activity change, appetite change and unexpected weight change.  HENT: Negative.    Respiratory:  Negative for cough and shortness of breath.   Cardiovascular:  Negative for leg swelling.  Gastrointestinal:  Negative for constipation.  Genitourinary:  Negative for frequency.  Musculoskeletal:  Negative for arthralgias, gait  problem and myalgias.  Skin: Negative.  Negative for rash.  Neurological:  Negative for dizziness and weakness.  Psychiatric/Behavioral:  Positive for dysphoric mood and sleep disturbance. Negative for confusion.   All  other systems reviewed and are negative.   Health Maintenance  Topic Date Due   Medicare Annual Wellness (AWV)  11/13/2020   COVID-19 Vaccine (6 - 2023-24 season) 01/12/2022   Pneumonia Vaccine 20+ Years old  Completed   HPV VACCINES  Aged Out   INFLUENZA VACCINE  Discontinued   Zoster Vaccines- Shingrix  Discontinued    Physical Exam: Vitals:   12/07/21 1336  BP: 134/82  Pulse: 81  Resp: 17  Temp: 97.7 F (36.5 C)  TempSrc: Temporal  SpO2: 96%  Weight: 166 lb 3.2 oz (75.4 kg)  Height: '5\' 11"'$  (1.803 m)   Body mass index is 23.18 kg/m. Physical Exam Vitals reviewed.  Constitutional:      Appearance: Normal appearance.  HENT:     Head: Normocephalic.     Nose: Nose normal.     Mouth/Throat:     Mouth: Mucous membranes are moist.     Pharynx: Oropharynx is clear.  Eyes:     Pupils: Pupils are equal, round, and reactive to light.  Cardiovascular:     Rate and Rhythm: Normal rate and regular rhythm.     Pulses: Normal pulses.     Heart sounds: No murmur heard. Pulmonary:     Effort: Pulmonary effort is normal. No respiratory distress.     Breath sounds: Normal breath sounds. No rales.  Abdominal:     General: Abdomen is flat. Bowel sounds are normal.     Palpations: Abdomen is soft.  Musculoskeletal:        General: Swelling present.     Cervical back: Neck supple.     Comments: Left More then right  Skin:    General: Skin is warm.  Neurological:     General: No focal deficit present.     Mental Status: He is alert and oriented to person, place, and time.  Psychiatric:        Mood and Affect: Mood normal.        Thought Content: Thought content normal.     Labs reviewed: Basic Metabolic Panel: Recent Labs    03/23/21 0000  07/29/21 0000 11/04/21 0000  NA 140 142 139  K 4.8 4.7 4.9  CL 104 105 101  CO2 26* 23* 26*  BUN 33* 31* 26*  CREATININE 1.4* 1.7* 1.5*  CALCIUM 9.0 9.0 9.2  9.2  TSH  --  0.97  --    Liver Function Tests: Recent Labs    03/17/21 0000 11/04/21 0000  AST  --  17  ALT  --  14  ALKPHOS  --  59  ALBUMIN 4.0 3.8  3.8   No results for input(s): "LIPASE", "AMYLASE" in the last 8760 hours. No results for input(s): "AMMONIA" in the last 8760 hours. CBC: Recent Labs    03/23/21 0000 07/29/21 0000 11/04/21 0000  WBC 4.1 4.3 4.9  HGB 13.4* 13.2* 13.5  HCT 39* 39* 40*  PLT 215 224 195   Lipid Panel: No results for input(s): "CHOL", "HDL", "LDLCALC", "TRIG", "CHOLHDL", "LDLDIRECT" in the last 8760 hours. No results found for: "HGBA1C"  Procedures since last visit: No results found.  Assessment/Plan 1. OSA (obstructive sleep apnea) Doing better with New Mask Sleeping better  Feels better with his Fatigue also  2. Essential hypertension Takes Lisinopril Prn depending on his BP in the morning  3. Recurrent major depressive disorder, in partial remission ([place]) Doing well on Wellbutrin  4. Stage 3b chronic kidney disease (HCC) Creat stable Follows with Dr  Upton  5. Fatigue, unspecified type Much Improved since sleeping well  6. B12 deficiency On B 12 Q weekly    Labs/tests ordered:  * No order type specified * Next appt:  Visit date not found

## 2021-12-29 NOTE — Patient Instructions (Addendum)
Please continue using your CPAP regularly. While your insurance requires that you use CPAP at least 4 hours each night on 70% of the nights, I recommend, that you not skip any nights and use it throughout the night if you can. Getting used to CPAP and staying with the treatment long term does take time and patience and discipline. Untreated obstructive sleep apnea when it is moderate to severe can have an adverse impact on cardiovascular health and raise her risk for heart disease, arrhythmias, hypertension, congestive heart failure, stroke and diabetes. Untreated obstructive sleep apnea causes sleep disruption, nonrestorative sleep, and sleep deprivation. This can have an impact on your day to day functioning and cause daytime sleepiness and impairment of cognitive function, memory loss, mood disturbance, and problems focussing. Using CPAP regularly can improve these symptoms.   We will complete a televisit in about 60 days for your new machine.

## 2021-12-29 NOTE — Progress Notes (Signed)
PATIENT: Philip [name] DOB: 07-10-1932  REASON FOR VISIT: follow up HISTORY FROM: patient  Chief Complaint  Patient presents with   Follow-up    Pt in room #2 and alone. Pt here today to f/u OSA on CPAP.     HISTORY OF PRESENT ILLNESS:  01/03/22 ALL:  Philip [name] is a 88 y.o. male here today for follow up for OSA on CPAP. He is scheduled to get his new CPAP machine this week. He has continued to use his old machine. He is tolerating CPAP well but continues to feel tired. ESS 2. He feels he has much less energy than he used to. He did restart B12 therapy but it has not helped as much. He may have noticed some improvement with using CPAP consistently over the past few months. He is hoping the new machine with increased pressure may be helpful.     HISTORY: (copied from Dr Dohmeier's previous note)  Philip [name] is a 88 y.o. Caucasian male patient seen here in follow up from a HST at the [facility] of this year- 02-15-2021:  Philip [name] is seen here today in a revisit, he has last been tested for sleep apnea in January of this year the Epworth sleepiness score was endorsed at 10 and the fatigue severity at 28 points.  The home sleep test confirmed that he had an overall AHI of 26.7/h during REM sleep this became 35.8/h and in non-REM sleep 24.2/h there was also a strong positional component to the severity of apnea which was 33.8/h in supine sleep.  However neither avoiding supine position alone nor did not in rem sleep alone would have resulted in a significant decrease in apnea he would still be carrying a diagnosis of mild to moderate apnea.   REM sleep was present for 21.6% of the total night according to the home sleep test calculation so he was not REM suppressed.   Intermittently the heart rate of this patient dropped and was slow and there was hypoxia noted 25 minutes of the night were associated with low oxygen levels the nadir being 65% saturation the patient now reports that  he used his previously prescribed CPAP sporadically he used it for about 2 weeks in May, June and July ,but did not achieve a significant decrease in the AHI while doing so.  Residual AHI was 9.8 /h. All OSA, high air leakage.    The CPAP was set  up in 12-2016 and will soon be 88 years old- set at 9 cm water pressure with 3 cm EPR and allow and he still left with mostly obstructive events.  In addition there is a high air leak from his mask.  The compliance was 28% for the last 90 days and the average user time was 2 hours 7 minutes, but on days when Philip [name] used the CPAP , he used it for 6.5 hours.   It looks to me that we would have to need to increase the CPAP pressure as the obstructive apneas are not sufficiently controlled.  I would also like for him to undergo a overnight pulse oximetry or even better to return for an in lab CPAP titration and see if he needs oxygen in conjunction with CPAP or if he could tolerate higher pressure of CPAP at all.   REVIEW OF SYSTEMS: Out of a complete 14 system review of symptoms, the patient complains only of the following symptoms, none and all other reviewed systems are  negative.  ESS: 2/24  ALLERGIES: Allergies  Allergen Reactions   Cefixime Rash   Contrast Media [Iodinated Contrast Media] Other (See Comments)    Avoid due to renal disease   Macrobid [Nitrofurantoin Macrocrystal] Swelling   Nsaids Other (See Comments)    Has been told no NSAIDs, Ibuprofen Renal insufficiency hx.    Cephalosporins Rash    HOME MEDICATIONS: Outpatient Medications Prior to Visit  Medication Sig Dispense Refill   buPROPion (WELLBUTRIN) 100 MG tablet Take 1 tablet (100 mg total) by mouth 2 (two) times daily.     Cholecalciferol (VITAMIN D) 50 MCG (2000 UT) CAPS Take by mouth every other day.     Cranberry-Vitamin C (AZO CRANBERRY URINARY TRACT PO) Take by mouth.     cyanocobalamin (,VITAMIN B-12,) 1000 MCG/ML injection Inject 1 mL (1,000 mcg total) into the  muscle every 14 (fourteen) days. 30 mL 3   Garlic (GARLIQUE PO) Take by mouth every other day.     lisinopril (ZESTRIL) 10 MG tablet Take 1 tablet by mouth once daily 90 tablet 2   RESVERATROL PO Take 100 mg by mouth every other day.     SYRINGE-NEEDLE, DISP, 3 ML (BD ECLIPSE SYRINGE/NEEDLE) 25G X 5/8" 3 ML MISC One injection every 14 days 24 each 0   thyroid (ARMOUR) 90 MG tablet Take 90 mg by mouth daily.     TURMERIC CURCUMIN PO Take 2 tablets by mouth daily.      UNABLE TO FIND [place] Bone take once daily     UNABLE TO FIND [place] take 4 tablets once daily     UNABLE TO FIND Comprehensive immune support take 2 tablets once daily     UNABLE TO FIND Modified citrus pectin take 4 tablets once daily     UNABLE TO FIND Celery seed take 2 tablets by mouth once daily     UNABLE TO FIND Maxivision multiple vitamin for eyes     No facility-administered medications prior to visit.    PAST MEDICAL HISTORY: Past Medical History:  Diagnosis Date   Age-related macular degeneration, dry, both eyes    Aortitis syndrome ([place]) rheumotologist-  dr syed   IgG4 syndrome--  (effects abdomine) ,  previously long term use prednisone last taken 07/ 2021   Basal cell carcinoma (BCC) of eye    CKD (chronic kidney disease), stage III [facility])    nephrologist-- dr Johnette Abraham. [name]   Complication of anesthesia    [name] op acute urinary retention   DDD (degenerative disc disease)    Fatigue    pt evalulation by cardiology--- dr Fransico Michael, note 05-21-2019 in epic,(non cardiac)  normal echo and event monitor showed extra beats of atrial tachycardia , no atrial fib, and rare ecotpy   Full dentures    GERD (gastroesophageal reflux disease)    History of basal cell carcinoma (BCC) excision    12-25-2018 MOH's reconstruction left lower eyelid   History of external beam radiation therapy 2002   prostate cancer  and boost with radioative prostate seed implants   History of prostate cancer DX  2002   S/P EXTERNAL  RADIATION/ RADIOACTIVE SEED IMPLANTS  2003   History of squamous cell carcinoma excision    2012--  RIGHT LOWER EXTREM;  2019 LEFT LOWER EXTREMITIY   HTN (hypertension)    followed by pcp   Hyperlipidemia    Hypothyroidism    followed by pcp--- acquired atrophy due to radiation;  previously seen by endocrinologist-  dr Chalmers Cater---  per pt takes his own thyroid med. called NP Thyroid 90 mg daily, does not take synthroid   Large vessel vasculitis ([place])    rheumotologist-  dr syed --- IgG4 related aoritis involving renal arteries ,  pt treated with predisone.  (09-06-2019  per pt no longer prednisone last taken one month ago 07/ 2021, weaned off by pcp per pt request)   Left renal artery stenosis [facility])    nephrologist--- dr [name]--- with aneurysm proximal    Lymphedema of left leg    followed by [facility]---- pt stated uses compression hose   OSA (obstructive sleep apnea) 09-06-2019  per pt lasted used cpap approx. 2019, stated felt he no longer needed    pt retested 11-13-2016 at [facility] w/ dr ather-- moderate to severe OSA , AHI 16.4/h/  03-24-2017    Pulmonary nodule followed by [facility]   per CT 06-11-2019  in [place] in epic done at [place],  LUL nodule , not mets   Renal cyst, right    Restless legs syndrome (RLS)    S/P radiation therapy     for tonsillar cancer (head and neck) completed 01-26-2012 at [facility]);   and XRT for right lower leg sarcoma --- completed 06-06-2017   Saliva decreased    Sarcoma of lower extremity, right ([place]) oncolgoist-  dr Juluis Rainier ([facility]) & Dr [name] ([facility])   dx 02-14-2017 w/ needle core bx;  high grade pleomorphic spindle cell sarcoma , grade 2 (cT2N0M0); 03-15-2017  radical resection sarcoma tumor right lower leg  and completed radiation 06-06-2017   Self-catheterizes urinary bladder    QID   AND   PRN   Tonsillar cancer ([place]) 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+) (Stage  IVA)---- dx oct 2013  ----s/p left tonsillectomy and concurent chemo and radiation/  ended 01-26-2012-- no surgical intervention---  residuals ( dry mouth, decreased saliva)   oncologist at [place]--  dr brizel--  HX OF -- NO RECURRENCE   Urethral false passage 07/2019   Urethral stricture urologist--- dr Jeffie Pollock   chronic---  [name] urethral dilation's   Urinary retention with incomplete bladder emptying    Vitamin B 12 deficiency 01/28/2011   Vitamin D deficiency     PAST SURGICAL HISTORY: Past Surgical History:  Procedure Laterality Date   BALLOON DILATION N/A 11/10/2014   Procedure: CYSTO BALLOON DILATION AND RETROGRADE URETHROGRAM ;  Surgeon: Bjorn Loser, MD;  Location: [place];  Service: Urology;  Laterality: N/A;   CATARACT EXTRACTION W/ INTRAOCULAR LENS  IMPLANT, BILATERAL  2012  approx   CYSTO/ BALLOON DILATION OF  URETHRAL STRICTURE  12-25-2010   CYSTOSCOPY WITH RETROGRADE URETHROGRAM N/A 03/30/2016   Procedure: CYSTOSCOPY WITH RETROGRADE URETHROGRAM AND BALLOON DILATION with cystogram;  Surgeon: Bjorn Loser, MD;  Location: [facility];  Service: Urology;  Laterality: N/A;   CYSTOSCOPY WITH URETHRAL DILATATION  05/31/2011   Procedure: CYSTOSCOPY WITH URETHRAL DILATATION;  Surgeon: Reece Packer, MD;  Location: [place];  Service: Urology;  Laterality: N/A;  BALLOON DILATION   CYSTOSCOPY WITH URETHRAL DILATATION N/A 09/13/2016   Procedure: CYSTOSCOPY WITH URETHRAL BALLOON DILATATION;  Surgeon: Bjorn Loser, MD;  Location: [place];  Service: Urology;  Laterality: N/A;   CYSTOSCOPY WITH URETHRAL DILATATION N/A 03/30/2017   Procedure: CYSTOSCOPY WITH BALLOON URETHRAL DILATATION;  Surgeon: Bjorn Loser, MD;  Location: [place];  Service: Urology;  Laterality: N/A;   CYSTOSCOPY WITH URETHRAL DILATATION N/A 09/12/2019   Procedure: CYSTOSCOPY WITH URETHRAL DILATATION;  Surgeon: Irine Seal, MD;  Location: [facility];  Service: Urology;  Laterality: N/A;   CYSTOSCOPY/RETROGRADE/URETEROSCOPY N/A 05/22/2012   Procedure: CYSTOSCOPY BALLOON DILATION RETROGRADE URETEROGRAM ;  Surgeon: Reece Packer, MD;  Location: [place];  Service: Urology;  Laterality: N/A;   CYTSO/  DILATATION URETHRAL STRICTURE/  BX PROSTATIC URETHRA/  REMOVAL FOREIGN BODIES  06-25-2010   [facility]   ECTROPION REPAIR Left 06/18/2019   lower eyelid   INGUINAL HERNIA REPAIR Right 2000   MOHS SURGERY  01/ 2019    [facility]   left lower leg for [facility]   MOHS SURGERY  12/25/2018   for [facility] of left lower eyelid;  the second stage Hughs flap release 01-09-2019   RADICAL RESECTION OF SARCOMA TUMOR   03-15-2017   [facility]   RIGHT LOWER LEG , CALF AREA   RADIOACTIVE SEED IMPLANTS, PROSTATE  JAN  2003   SKIN LESION EXCISION  07/2012   MOST  right shoulder   TONSILLECTOMY Left 11-10-2011  '@[facility]'$    tonsillar cancer   TRANSURETHRAL INCISION OF BLADDER NECK N/A 09/12/2019   Procedure: TRANSURETHRAL INCISION OF BLADDER NECK;  Surgeon: Irine Seal, MD;  Location: [place];  Service: Urology;  Laterality: N/A;    FAMILY HISTORY: Family History  Problem Relation Age of Onset   Stroke Father     SOCIAL HISTORY: Social History   Socioeconomic History   Marital status: Widowed    Spouse name: Not on file   Number of children: 4   Years of education: college   Highest education level: Not on file  Occupational History   Occupation: retired  Tobacco Use   Smoking status: Never   Smokeless tobacco: Never  Vaping Use   Vaping Use: Never used  Substance and Sexual Activity   Alcohol use: Yes    Alcohol/week: 7.0 standard drinks of alcohol    Types: 7 Glasses of wine per week    Comment: 8oz, glass or red wine for dinner   Drug use: No   Sexual activity: Not on file  Other Topics Concern   Not on file  Social History Narrative   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    Right handed   Social Determinants of Health   Financial Resource Strain: Low Risk  (08/08/2017)   Overall Financial Resource Strain (CARDIA)    Difficulty of Paying Living Expenses: Not hard at all  Food Insecurity: No Food Insecurity (08/08/2017)   Hunger Vital Sign    Worried About Running Out of Food in the Last Year: Never true    Ran Out of Food in the Last Year: Never true  Transportation Needs: No Transportation Needs (08/08/2017)   PRAPARE - Hydrologist (Medical): No    Lack of Transportation (Non-Medical): No  Physical Activity: Insufficiently Active (08/08/2017)   Exercise Vital Sign    Days of Exercise per Week: 3 days    Minutes of Exercise per Session: 30 min  Stress: No Stress Concern Present (08/08/2017)   [place]    Feeling of Stress : Only a little  Social Connections: Somewhat Isolated (08/08/2017)   Social Connection and Isolation Panel [NHANES]  Frequency of Communication with Friends and Family: More than three times a week    Frequency of Social Gatherings with Friends and Family: More than three times a week    Attends Religious Services: Never    Marine scientist or Organizations: No    Attends Archivist Meetings: Never    Marital Status: Married  Human resources officer Violence: Not At Risk (08/08/2017)   Humiliation, Afraid, Rape, and Kick questionnaire    Fear of Current or Ex-Partner: No    Emotionally Abused: No    Physically Abused: No    Sexually Abused: No     PHYSICAL EXAM  Vitals:   01/03/22 1529  BP: 128/75  Pulse: 80  Weight: 165 lb 8 oz (75.1 kg)  Height: '5\' 11"'$  (1.803 m)   Body mass index is 23.08 kg/m.  Generalized: Well developed, in no acute distress  Cardiology: normal rate and rhythm, no murmur noted Respiratory: clear to  auscultation bilaterally  Neurological examination  Mentation: Alert oriented to time, place, history taking. Follows all commands speech and language fluent Cranial nerve II-XII: Pupils were equal round reactive to light. Extraocular movements were full, visual field were full  Motor: The motor testing reveals 5 over 5 strength of all 4 extremities. Good symmetric motor tone is noted throughout.  Gait and station: Gait is normal.    DIAGNOSTIC DATA (LABS, IMAGING, TESTING) - I reviewed patient records, labs, notes, testing and imaging myself where available.     08/08/2017    9:48 AM 08/02/2016    9:55 AM 05/20/2015   10:00 AM  MMSE - Mini Mental State Exam  Orientation to time '5 4 5  '$ Orientation to Place '5 5 5  '$ Registration '3 3 3  '$ Attention/ Calculation '5 5 5  '$ Recall '3 3 3  '$ Language- name 2 objects '2 2 2  '$ Language- repeat '1 1 1  '$ Language- follow 3 step command '3 3 3  '$ Language- read & follow direction '1 1 1  '$ Write a sentence '1 1 1  '$ Copy design '1 1 1  '$ Total score '30 29 30     '$ Lab Results  Component Value Date   WBC 4.9 11/04/2021   HGB 13.5 11/04/2021   HCT 40 (A) 11/04/2021   MCV 97.5 08/24/2017   PLT 195 11/04/2021      Component Value Date/Time   NA 139 11/04/2021 0000   K 4.9 11/04/2021 0000   CL 101 11/04/2021 0000   CO2 26 (A) 11/04/2021 0000   GLUCOSE 102 (H) 09/12/2019 1100   BUN 26 (A) 11/04/2021 0000   CREATININE 1.5 (A) 11/04/2021 0000   CREATININE 1.40 (H) 09/12/2019 1100   CALCIUM 9.2 11/04/2021 0000   CALCIUM 9.2 11/04/2021 0000   PROT 6.7 08/24/2017 2136   ALBUMIN 3.8 11/04/2021 0000   ALBUMIN 3.8 11/04/2021 0000   AST 17 11/04/2021 0000   ALT 14 11/04/2021 0000   ALKPHOS 59 11/04/2021 0000   BILITOT 1.2 08/24/2017 2136   GFRNONAA 34 (L) 08/24/2017 2136   GFRAA 40 (L) 08/24/2017 2136   Lab Results  Component Value Date   CHOL 218 (A) 07/28/2020   HDL 65 07/28/2020   LDLCALC 139 07/28/2020   TRIG 73 07/28/2020   No results found for:  "HGBA1C" Lab Results  Component Value Date   VITAMINB12 497 07/28/2020   Lab Results  Component Value Date   TSH 0.97 07/29/2021     ASSESSMENT AND PLAN 88 y.o. 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 2019, stated felt he no longer needed ), Pulmonary nodule (followed by [facility]), Renal cyst, right, Restless legs syndrome (RLS), S/P radiation therapy, Saliva decreased, Sarcoma of lower extremity, right ([place]) (oncolgoist-  dr Juluis Rainier ([facility]) & Dr [name] ([facility])), Self-catheterizes urinary bladder, Tonsillar cancer ([place]) (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+) (Stage IVA)---- dx oct 2013  ----s/p left tonsillectomy and concurent chemo and radiation/  ended 01-26-2012-- no surgical intervention---  residuals ( dry mouth, decreased saliva)), Urethral false passage (07/2019), Urethral stricture (urologist--- dr Jeffie Pollock), Urinary retention with incomplete bladder emptying, Vitamin B 12 deficiency (01/28/2011), and Vitamin D deficiency. here with     ICD-10-CM   1. OSA on CPAP  G47.33         Lumir Demetriou Routh is doing well on CPAP therapy. Compliance report reveals excellent compliance. He was encouraged to continue using CPAP nightly and for  greater than 4 hours each night. He continues to feel energy level is lower than it should be. He is planning to get new CPAP device 01/06/2022. We will reschedule televisit for follow up in 60 days to review compliance and assess for improvement in energy. We will update supply orders as indicated. Risks of untreated sleep apnea review and education materials provided. Healthy lifestyle habits encouraged. He will follow up in 60 days, sooner if needed. He verbalizes understanding and agreement with this plan.    No orders of the defined types were placed in this encounter.    No orders of the defined types were placed in this encounter.     Debbora Presto, FNP-C 01/03/2022, 3:33 PM [facility] [address] [place], [place] [address] [phone]

## 2022-01-03 ENCOUNTER — Ambulatory Visit (INDEPENDENT_AMBULATORY_CARE_PROVIDER_SITE_OTHER): Payer: Medicare Other | Admitting: Family Medicine

## 2022-01-03 ENCOUNTER — Encounter: Payer: Self-pay | Admitting: Family Medicine

## 2022-01-03 VITALS — BP 128/75 | HR 80 | Ht 71.0 in | Wt 165.5 lb

## 2022-01-03 DIAGNOSIS — G4733 Obstructive sleep apnea (adult) (pediatric): Secondary | ICD-10-CM

## 2022-01-15 ENCOUNTER — Other Ambulatory Visit: Payer: Self-pay | Admitting: Adult Health

## 2022-01-15 DIAGNOSIS — E538 Deficiency of other specified B group vitamins: Secondary | ICD-10-CM

## 2022-01-18 DIAGNOSIS — E039 Hypothyroidism, unspecified: Secondary | ICD-10-CM | POA: Diagnosis not present

## 2022-01-24 DIAGNOSIS — N3020 Other chronic cystitis without hematuria: Secondary | ICD-10-CM | POA: Diagnosis not present

## 2022-01-24 DIAGNOSIS — N35012 Post-traumatic membranous urethral stricture: Secondary | ICD-10-CM | POA: Diagnosis not present

## 2022-01-28 DIAGNOSIS — N3020 Other chronic cystitis without hematuria: Secondary | ICD-10-CM | POA: Diagnosis not present

## 2022-01-28 DIAGNOSIS — R3914 Feeling of incomplete bladder emptying: Secondary | ICD-10-CM | POA: Diagnosis not present

## 2022-01-28 DIAGNOSIS — N35012 Post-traumatic membranous urethral stricture: Secondary | ICD-10-CM | POA: Diagnosis not present

## 2022-02-02 DIAGNOSIS — L570 Actinic keratosis: Secondary | ICD-10-CM | POA: Diagnosis not present

## 2022-02-02 DIAGNOSIS — L988 Other specified disorders of the skin and subcutaneous tissue: Secondary | ICD-10-CM | POA: Diagnosis not present

## 2022-02-02 DIAGNOSIS — L905 Scar conditions and fibrosis of skin: Secondary | ICD-10-CM | POA: Diagnosis not present

## 2022-02-02 DIAGNOSIS — L853 Xerosis cutis: Secondary | ICD-10-CM | POA: Diagnosis not present

## 2022-02-07 DIAGNOSIS — H18513 Endothelial corneal dystrophy, bilateral: Secondary | ICD-10-CM | POA: Diagnosis not present

## 2022-02-07 DIAGNOSIS — H1820 Unspecified corneal edema: Secondary | ICD-10-CM | POA: Diagnosis not present

## 2022-02-07 DIAGNOSIS — H353134 Nonexudative age-related macular degeneration, bilateral, advanced atrophic with subfoveal involvement: Secondary | ICD-10-CM | POA: Diagnosis not present

## 2022-02-07 DIAGNOSIS — H52203 Unspecified astigmatism, bilateral: Secondary | ICD-10-CM | POA: Diagnosis not present

## 2022-02-07 DIAGNOSIS — H26492 Other secondary cataract, left eye: Secondary | ICD-10-CM | POA: Diagnosis not present

## 2022-02-07 DIAGNOSIS — H43813 Vitreous degeneration, bilateral: Secondary | ICD-10-CM | POA: Diagnosis not present

## 2022-02-24 ENCOUNTER — Other Ambulatory Visit: Payer: Self-pay | Admitting: Internal Medicine

## 2022-02-28 ENCOUNTER — Telehealth: Payer: Self-pay | Admitting: Family Medicine

## 2022-02-28 NOTE — Telephone Encounter (Signed)
Pt has called to report that even with current settings on CPAP it is not helping his sleep, please call to discuss.

## 2022-02-28 NOTE — Telephone Encounter (Signed)
   Called pt back to get more info. Pt reports that even with pressure change to 5-13cm, he is still waking up around 3-4am and cannot go back to sleep. Would like to know what else can be done for him. I did let pt know report looks good from the standpoint of controlling OSA/no air leaks. Aware I will send to AL,NP to review and call back tomorrow for recommendations.

## 2022-03-02 NOTE — Telephone Encounter (Signed)
Please see message from today 03/02/2022 @ 11:43am

## 2022-03-09 ENCOUNTER — Telehealth: Payer: Self-pay

## 2022-03-09 DIAGNOSIS — L814 Other melanin hyperpigmentation: Secondary | ICD-10-CM | POA: Diagnosis not present

## 2022-03-09 DIAGNOSIS — L570 Actinic keratosis: Secondary | ICD-10-CM | POA: Diagnosis not present

## 2022-03-09 DIAGNOSIS — Z85820 Personal history of malignant melanoma of skin: Secondary | ICD-10-CM | POA: Diagnosis not present

## 2022-03-09 DIAGNOSIS — L218 Other seborrheic dermatitis: Secondary | ICD-10-CM | POA: Diagnosis not present

## 2022-03-09 NOTE — Progress Notes (Deleted)
   PATIENT: Philip [name] DOB: Nov 13, 1932  REASON FOR VISIT: follow up HISTORY FROM: patient  Virtual Visit via Telephone Note  I connected with Tressie Ellis on 03/09/22 at  3:30 PM EST by telephone and verified that I am speaking with the correct person using two identifiers.   I discussed the limitations, risks, security and privacy concerns of performing an evaluation and management service by telephone and the availability of in person appointments. I also discussed with the patient that there may be a patient responsible charge related to this service. The patient expressed understanding and agreed to proceed.   History of Present Illness:  03/09/22 ALL: YERACHMIEL PRIZZI is a 89 y.o. male here today for follow up.    History (copied from previous note)   Observations/Objective:  Generalized: Well developed, in no acute distress  Mentation: Alert oriented to time, place, history taking. Follows all commands speech and language fluent   Assessment and Plan:  89 y.o. year old male  has a past medical history of Age-related macular degeneration, dry, both eyes, Aortitis syndrome ([place]) (rheumotologist-  dr syed), Basal cell carcinoma (BCC) of eye, CKD (chronic kidney disease), stage III ([place]), Complication of anesthesia, DDD (degenerative disc disease), Fatigue, Full dentures, GERD (gastroesophageal reflux disease), History of basal cell carcinoma (BCC) excision, History of external beam radiation therapy (2002), History of prostate cancer (DX  2002), History of squamous cell carcinoma excision, HTN (hypertension), Hyperlipidemia, Hypothyroidism, 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 2019, stated felt he no longer needed ), Pulmonary nodule (followed by [facility]), Renal cyst, right, Restless legs syndrome (RLS), S/P radiation therapy, Saliva  decreased, Sarcoma of lower extremity, right ([place]) (oncolgoist-  dr Juluis Rainier ([facility]) & Dr [name] ([facility])), Self-catheterizes urinary bladder, Tonsillar cancer ([place]) (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+) (Stage IVA)---- dx oct 2013  ----s/p left tonsillectomy and concurent chemo and radiation/  ended 01-26-2012-- no surgical intervention---  residuals ( dry mouth, decreased saliva)), Urethral false passage (07/2019), Urethral stricture (urologist--- dr Jeffie Pollock), Urinary retention with incomplete bladder emptying, Vitamin B 12 deficiency (01/28/2011), and Vitamin D deficiency. here with  No diagnosis found.  No orders of the defined types were placed in this encounter.   No orders of the defined types were placed in this encounter.    Follow Up Instructions:  I discussed the assessment and treatment plan with the patient. The patient was provided an opportunity to ask questions and all were answered. The patient agreed with the plan and demonstrated an understanding of the instructions.   The patient was advised to call back or seek an in-person evaluation if the symptoms worsen or if the condition fails to improve as anticipated.  I provided *** minutes of non-face-to-face time during this encounter. Patient located at their place of residence during [place] visit. Provider is in the office.    Debbora Presto, NP

## 2022-03-10 ENCOUNTER — Telehealth: Payer: Medicare Other | Admitting: Family Medicine

## 2022-03-10 ENCOUNTER — Telehealth: Payer: Self-pay | Admitting: Family Medicine

## 2022-03-10 DIAGNOSIS — D519 Vitamin B12 deficiency anemia, unspecified: Secondary | ICD-10-CM | POA: Diagnosis not present

## 2022-03-10 LAB — VITAMIN B12: Vitamin B-12: 858

## 2022-03-10 NOTE — Telephone Encounter (Signed)
I attempted to contact Philip [name] at home and mobile number provided in chart. I left message on VM to return call to complete appt. If he calls back to reschedule, please let patient know that televisits may not be covered by insurance and Mychart or in office visit is needed.

## 2022-03-11 ENCOUNTER — Encounter: Payer: Self-pay | Admitting: Internal Medicine

## 2022-03-11 DIAGNOSIS — I701 Atherosclerosis of renal artery: Secondary | ICD-10-CM | POA: Diagnosis not present

## 2022-03-11 DIAGNOSIS — I129 Hypertensive chronic kidney disease with stage 1 through stage 4 chronic kidney disease, or unspecified chronic kidney disease: Secondary | ICD-10-CM | POA: Diagnosis not present

## 2022-03-11 DIAGNOSIS — N1831 Chronic kidney disease, stage 3a: Secondary | ICD-10-CM | POA: Diagnosis not present

## 2022-03-11 DIAGNOSIS — F32A Depression, unspecified: Secondary | ICD-10-CM | POA: Diagnosis not present

## 2022-03-11 DIAGNOSIS — C499 Malignant neoplasm of connective and soft tissue, unspecified: Secondary | ICD-10-CM | POA: Diagnosis not present

## 2022-03-11 DIAGNOSIS — M316 Other giant cell arteritis: Secondary | ICD-10-CM | POA: Diagnosis not present

## 2022-03-11 LAB — CBC AND DIFFERENTIAL
HCT: 40 — AB (ref 41–53)
Hemoglobin: 13.8 (ref 13.5–17.5)
Neutrophils Absolute: 2.6
Platelets: 200 10*3/uL (ref 150–400)
WBC: 4.7

## 2022-03-11 LAB — COMPREHENSIVE METABOLIC PANEL
Albumin: 4.2 (ref 3.5–5.0)
Calcium: 9 (ref 8.7–10.7)
eGFR: 45

## 2022-03-11 LAB — BASIC METABOLIC PANEL
BUN: 30 — AB (ref 4–21)
CO2: 25 — AB (ref 13–22)
Chloride: 101 (ref 99–108)
Creatinine: 1.5 — AB (ref 0.6–1.3)
Glucose: 91
Potassium: 4.7 mEq/L (ref 3.5–5.1)
Sodium: 137 (ref 137–147)

## 2022-03-11 LAB — CBC: RBC: 4.34 (ref 3.87–5.11)

## 2022-03-11 LAB — POCT ERYTHROCYTE SEDIMENTATION RATE, NON-AUTOMATED: Sed Rate: 11

## 2022-03-21 ENCOUNTER — Other Ambulatory Visit: Payer: Self-pay | Admitting: Internal Medicine

## 2022-03-21 DIAGNOSIS — F3341 Major depressive disorder, recurrent, in partial remission: Secondary | ICD-10-CM

## 2022-03-22 DIAGNOSIS — E039 Hypothyroidism, unspecified: Secondary | ICD-10-CM | POA: Diagnosis not present

## 2022-03-28 NOTE — Progress Notes (Unsigned)
Cardiology Office Note:    Date:  03/30/2022   ID:  DARVIS CARTON, DOB 06-25-1932, MRN [id number]  PCP:  Virgie Dad, MD   [facility] Providers Cardiologist:  None   Referring MD: Virgie Dad, MD    History of Present Illness:    NAITHEN SCHUPPE is a 89 y.o. male with a hx of HTN, CKD IIIB, PTSD, HLD, and OSA who was referred by Dr. Lyndel Safe for further evaluation of fatigue.  Patient seen by Dr. Lyndel Safe in 10/2021 where he reported significant BP swings prompting referral to Cardiology for further management.  Notably, saw Dr. Audie Box in 05/2019 for fatigue. TTE at that time with normal EF, no valve disease. BNP 99. Zio with brief run of AT with longest episode 12.6 seconds. Was recommended for continued surveillance  Today, the patient states that he continues to struggle with fatigue with exertion that seems to have worsened over the past several months. No chest pain, SOB at rest, orthopnea, PND. Has chronic lymphedema in LLE. Has some dizziness episodes when he wakes up in the morning but this improves throughout the day. No syncope. Has been hydrating well. Remains very active despite age but he is just noticing more fatigued that usual.   States his blood pressure has been better since changing his diet. BP is well controlled in clinic today.  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 [name]   Complication of anesthesia    post op acute urinary retention   DDD (degenerative disc disease)    Fatigue    pt evalulation by cardiology--- dr Fransico Michael, note 05-21-2019 in epic,(non cardiac)  normal echo and event monitor showed extra beats of atrial tachycardia  , no atrial fib, and rare ecotpy   Full dentures    GERD (gastroesophageal reflux disease)    History of basal cell carcinoma (BCC) excision    12-25-2018 MOH's reconstruction left lower eyelid   History of external beam radiation therapy 2002   prostate cancer  and boost with radioative prostate seed implants   History of prostate cancer DX  2002   S/P EXTERNAL RADIATION/ RADIOACTIVE SEED IMPLANTS  2003   History of squamous cell carcinoma excision    2012--  RIGHT LOWER EXTREM;  2019 LEFT LOWER EXTREMITIY   HTN (hypertension)    followed by pcp   Hyperlipidemia    Hypothyroidism    followed by pcp--- acquired atrophy due to radiation;  previously seen by endocrinologist-  dr Chalmers Cater--- per pt takes his own thyroid med. called NP Thyroid 90 mg daily, does not take synthroid   Large vessel vasculitis ([place])    rheumotologist-  dr syed --- IgG4 related aoritis involving renal arteries ,  pt treated with predisone.  (09-06-2019  per pt no longer prednisone last taken one month ago 07/ 2021, weaned off by pcp per pt request)   Left renal artery stenosis [facility])    nephrologist--- dr [name]--- with aneurysm proximal    Lymphedema of left leg    followed by [facility]---- pt stated uses compression hose   OSA (obstructive sleep apnea) 09-06-2019  per pt  lasted used cpap approx. 2019, stated felt he no longer needed    pt retested 11-13-2016 at [facility] w/ dr ather-- moderate to severe OSA , AHI 16.4/h/  03-24-2017    Pulmonary nodule followed by [facility]   per CT 06-11-2019  in [place] in epic done at [place],  LUL nodule , not mets   Renal cyst, right    Restless legs syndrome (RLS)    S/P radiation therapy     for tonsillar cancer (head and neck) completed 01-26-2012 at [facility]);   and XRT for right lower leg sarcoma --- completed 06-06-2017   Saliva decreased    Sarcoma of lower extremity, right ([place]) oncolgoist-  dr Juluis Rainier ([facility]) & Dr [name] ([facility])   dx 02-14-2017 w/ needle core bx;  high grade pleomorphic spindle cell sarcoma , grade 2 (cT2N0M0); 03-15-2017  radical resection sarcoma tumor right lower leg  and completed radiation 06-06-2017   Self-catheterizes urinary bladder    QID   AND   PRN   Tonsillar cancer ([place]) 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+) (Stage IVA)---- dx oct 2013  ----s/p left tonsillectomy and concurent chemo and radiation/  ended 01-26-2012-- no surgical intervention---  residuals ( dry mouth, decreased saliva)   oncologist at [place]--  dr brizel--  HX OF -- NO RECURRENCE   Urethral false passage 07/2019   Urethral stricture urologist--- dr Jeffie Pollock   chronic---  post urethral dilation's   Urinary retention with incomplete bladder emptying    Vitamin B 12 deficiency 01/28/2011   Vitamin D deficiency     Past Surgical History:  Procedure Laterality Date   BALLOON DILATION N/A 11/10/2014   Procedure: CYSTO BALLOON DILATION AND RETROGRADE URETHROGRAM ;  Surgeon: Bjorn Loser, MD;  Location: [facility];  Service: Urology;  Laterality: N/A;   CATARACT EXTRACTION W/ INTRAOCULAR LENS  IMPLANT, BILATERAL  2012  approx   CYSTO/ BALLOON DILATION OF  URETHRAL STRICTURE  12-25-2010   CYSTOSCOPY WITH RETROGRADE URETHROGRAM N/A 03/30/2016   Procedure: CYSTOSCOPY WITH RETROGRADE URETHROGRAM AND BALLOON DILATION with cystogram;  Surgeon: Bjorn Loser, MD;  Location[facility];  Service: Urology;  Laterality: N/A;   CYSTOSCOPY WITH URETHRAL DILATATION  05/31/2011   Procedure: CYSTOSCOPY WITH URETHRAL DILATATION;  Surgeon: Reece Packer, MD;  Location: [place];  Service: Urology;  Laterality: N/A;  BALLOON DILATION   CYSTOSCOPY WITH URETHRAL DILATATION N/A 09/13/2016   Procedure: CYSTOSCOPY WITH URETHRAL BALLOON DILATATION;  Surgeon: Bjorn Loser, MD;  Location: [place];  Service: Urology;  Laterality: N/A;    CYSTOSCOPY WITH URETHRAL DILATATION N/A 03/30/2017   Procedure: CYSTOSCOPY WITH BALLOON URETHRAL DILATATION;  Surgeon: Bjorn Loser, MD;  Location: [place];  Service: Urology;  Laterality: N/A;   CYSTOSCOPY WITH URETHRAL DILATATION N/A 09/12/2019   Procedure: CYSTOSCOPY WITH URETHRAL DILATATION;  Surgeon: Irine Seal, MD;  Location: [facility];  Service: Urology;  Laterality: N/A;   CYSTOSCOPY/RETROGRADE/URETEROSCOPY N/A 05/22/2012   Procedure: CYSTOSCOPY BALLOON DILATION RETROGRADE URETEROGRAM ;  Surgeon: Reece Packer, MD;  Location: [place];  Service: Urology;  Laterality: N/A;   CYTSO/  DILATATION URETHRAL STRICTURE/  BX PROSTATIC URETHRA/  REMOVAL FOREIGN BODIES  06-25-2010   [facility]   ECTROPION REPAIR Left 06/18/2019   lower eyelid   INGUINAL HERNIA REPAIR Right 2000   MOHS SURGERY  01/ 2019  [facility]   left lower leg for [facility]   MOHS SURGERY  12/25/2018   for [facility] of left lower eyelid;  the second stage Hughs flap release 01-09-2019   RADICAL RESECTION OF SARCOMA TUMOR   03-15-2017   [facility]   RIGHT LOWER LEG , CALF AREA   RADIOACTIVE SEED IMPLANTS, PROSTATE  JAN  2003   SKIN LESION EXCISION  07/2012   MOST  right shoulder   TONSILLECTOMY Left 11-10-2011  '@[facility]'$    tonsillar cancer   TRANSURETHRAL INCISION OF BLADDER NECK N/A 09/12/2019   Procedure: TRANSURETHRAL INCISION OF BLADDER NECK;  Surgeon: Irine Seal, MD;  Location: [facility];  Service: Urology;  Laterality: N/A;    Current Medications: Current Meds  Medication Sig   buPROPion (WELLBUTRIN) 100 MG tablet Take 1 tablet by mouth twice daily   Cholecalciferol (VITAMIN D) 50 MCG (2000 UT) CAPS Take by mouth every other day.   Cranberry-Vitamin C (AZO CRANBERRY URINARY TRACT PO) Take by mouth.   cyanocobalamin (VITAMIN B12) 1000 MCG/ML injection INJECT 1 ML INTRAMUSCULARLY  FOR 14 DAYS   Garlic (GARLIQUE PO) Take by mouth every other day.   lisinopril  (ZESTRIL) 10 MG tablet Take 1 tablet by mouth once daily   RESVERATROL PO Take 100 mg by mouth every other day.   SYRINGE-NEEDLE, DISP, 3 ML (BD ECLIPSE SYRINGE/NEEDLE) 25G X 5/8" 3 ML MISC One injection every 14 days   TURMERIC CURCUMIN PO Take 2 tablets by mouth daily.    UNABLE TO FIND [place] take once daily   UNABLE TO FIND [place] take 4 tablets once daily   UNABLE TO FIND Comprehensive immune support take 2 tablets once daily   UNABLE TO FIND Modified citrus pectin take 4 tablets once daily   UNABLE TO FIND Celery seed take 2 tablets by mouth once daily   UNABLE TO FIND Maxivision multiple vitamin for eyes   [DISCONTINUED] thyroid (ARMOUR) 90 MG tablet Take 90 mg by mouth daily.     Allergies:   Cefixime, Contrast media [iodinated contrast media], Macrobid [nitrofurantoin macrocrystal], Nsaids, and Cephalosporins   Social History   Socioeconomic History   Marital status: Widowed    Spouse name: Not on file   Number of children: 4   Years of education: college   Highest education level: Not on file  Occupational History   Occupation: retired  Tobacco Use   Smoking status: Never   Smokeless tobacco: Never  Vaping Use   Vaping Use: Never used  Substance and Sexual Activity   Alcohol use: Yes    Alcohol/week: 7.0 standard drinks of alcohol    Types: 7 Glasses of wine per week    Comment: 8oz, glass or red wine for dinner   Drug use: No   Sexual activity: Not on file  Other Topics Concern   Not on file  Social History Narrative   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    Right handed   Social Determinants of Health   Financial Resource Strain: Low Risk  (08/08/2017)   Overall Financial Resource Strain (CARDIA)    Difficulty of Paying Living Expenses: Not hard at all  Food Insecurity: No Food Insecurity (03/30/2022)   Hunger Vital Sign    Worried About Running Out of  Food in the Last Year: Never true    Ran Out of  Food in the Last Year: Never true  Transportation Needs: No Transportation Needs (03/30/2022)   PRAPARE - Hydrologist (Medical): No    Lack of Transportation (Non-Medical): No  Physical Activity: Inactive (03/30/2022)   Exercise Vital Sign    Days of Exercise per Week: 0 days    Minutes of Exercise per Session: 0 min  Stress: No Stress Concern Present (08/08/2017)   [place]    Feeling of Stress : Only a little  Social Connections: Somewhat Isolated (08/08/2017)   Social Connection and Isolation Panel [NHANES]    Frequency of Communication with Friends and Family: More than three times a week    Frequency of Social Gatherings with Friends and Family: More than three times a week    Attends Religious Services: Never    Marine scientist or Organizations: No    Attends Archivist Meetings: Never    Marital Status: Married     Family History: The patient's family history includes Stroke in his father.  ROS:   Please see the history of present illness.     All other systems reviewed and are negative.  EKGs/Labs/Other Studies Reviewed:    The following studies were reviewed today: TTE 29-May-2019: IMPRESSIONS     1. Normal GLS -21.5. Left ventricular ejection fraction, by estimation,  is 55 to 60%. The left ventricle has normal function. The left ventricle  has no regional wall motion abnormalities. There is mild left ventricular  hypertrophy. Left ventricular  diastolic parameters were normal.   2. Right ventricular systolic function is normal. The right ventricular  size is normal. There is normal pulmonary artery systolic pressure.   3. The mitral valve is normal in structure. Trivial mitral valve  regurgitation. No evidence of mitral stenosis.   4. The aortic valve is tricuspid. Aortic valve regurgitation is not   visualized. Mild aortic valve sclerosis is present, with no evidence of  aortic valve stenosis.   5. Aortic dilatation noted. There is mild dilatation of the aortic root  measuring 42 mm.   6. The inferior vena cava is normal in size with greater than 50%  respiratory variability, suggesting right atrial pressure of 3 mmHg.   Cardiac Monitor 05/2019: Enrollment 05/08/2019-05/13/2019 (4 days 20 hours). Patient had a min HR of 43 bpm (sinus bradycardia), max HR of 179 bpm (5 beats SVT, 1.9 second duration), and avg HR of 65 bpm (normal sinus rhythm). Predominant underlying rhythm was Sinus Rhythm. First Degree AV Block was present. 2 Non-sustained Ventricular Tachycardia runs occurred, the run with the fastest interval lasting 4 beats (1.5 seconds) with a max rate of 162 bpm, the longest lasting 7 beats (4 second duration) with an avg rate of 133 bpm. 86 Supraventricular Tachycardia runs occurred, the run with the fastest interval lasting 5 beats with a max rate of 179 bpm, the longest lasting 12.6 secs with an avg rate of 119 bpm. Episodes appear to be Atrial Tachycardia with variable block. Isolated SVEs were rare (<1.0%), SVE Couplets were rare (<1.0%), and SVE Triplets were rare (<1.0%). Isolated VEs were rare (<1.0%), VE Couplets were rare (<1.0%), and no VE Triplets were present. Ventricular Trigeminy was present. No atrial fibrillation. No diary submitted.    Impression:   1. 86 Brief atrial tachycardia episodes in ~5 days. Longest episode 12.6 seconds. 2. No atrial fibrillation.  3. Rare ectopy.   EKG:  EKG is  ordered today.  The ekg ordered today demonstrates NSR, first degree AVB  Recent Labs: 07/29/2021: TSH 0.97 11/04/2021: ALT 14 03/11/2022: BUN 30; Creatinine 1.5; Hemoglobin 13.8; Platelets 200; Potassium 4.7; Sodium 137  Recent Lipid Panel    Component Value Date/Time   CHOL 218 (A) 07/28/2020 0000   TRIG 73 07/28/2020 0000   HDL 65 07/28/2020 0000   LDLCALC 139 07/28/2020  0000     Risk Assessment/Calculations:                Physical Exam:    VS:  BP 110/68 (BP Location: Right Arm, Patient Position: Sitting, Cuff Size: Normal)   Pulse 68   Ht '5\' 11"'$  (1.803 m)   Wt 165 lb 12.8 oz (75.2 kg)   BMI 23.12 kg/m     Wt Readings from Last 3 Encounters:  03/30/22 165 lb 12.8 oz (75.2 kg)  01/03/22 165 lb 8 oz (75.1 kg)  12/07/21 166 lb 3.2 oz (75.4 kg)     GEN:  Well nourished, well developed in no acute distress HEENT: Normal NECK: No JVD; No carotid bruits CARDIAC: RRR, no murmurs, rubs, gallops RESPIRATORY:  Clear to auscultation without rales, wheezing or rhonchi  ABDOMEN: Soft, non-tender, non-distended MUSCULOSKELETAL:  No edema; No deformity  SKIN: Warm and dry NEUROLOGIC:  Alert and oriented x 3 PSYCHIATRIC:  Normal affect   ASSESSMENT:    1. Dyspnea on exertion   2. Essential hypertension   3. Mixed hyperlipidemia    PLAN:    In order of problems listed above:  #Fatigue with Exertion: Previously seen by Dr. Audie Box in 2021 for similar issue. TTE at that time with normal EF, no significant valve disease. Cardiac monitor with brief run of atach. Symptoms are mainly exertional and improve with rest. Has not had ischemic work-up. Will check NM PET for further evaluation. -Check NM PET -Keep BP log as below  #HTN: Reports his BP can fluctuate but currently well controlled. Will have him keep BP log for [place] to review. -Continue lisinopril '10mg'$  daily -Keep BP log for [place] to review  #HLD: Not on statin therapy at this time. Will defer pending PET  #History of Aortitis: No recent flares. Not currently on immunosuppressants        Medication Adjustments/Labs and Tests Ordered: Current medicines are reviewed at length with the patient today.  Concerns regarding medicines are outlined above.  Orders Placed This Encounter  Procedures   NM PET CT CARDIAC PERFUSION MULTI W/ABSOLUTE BLOODFLOW   EKG 12-Lead   No orders of the  defined types were placed in this encounter.   Patient Instructions  Medication Instructions:  Your physician recommends that you continue on your current medications as directed. Please refer to the Current Medication list given to you today.  *If you need a refill on your cardiac medications before your next appointment, please call your pharmacy*  Testing/Procedures: How to Prepare for Your Cardiac PET/CT Stress Test:  1. Please do not take these medications before your test:   Medications that may interfere with the cardiac pharmacological stress agent (ex. nitrates - including erectile dysfunction medications, isosorbide mononitrate, tamulosin or beta-blockers) the day of the exam. (Erectile dysfunction medication should be held for at least 72 hrs prior to test) Theophylline containing medications for 12 hours. Dipyridamole 48 hours prior to the test. Your remaining medications may be taken with water.  2. Nothing to eat or drink, except water, 3 hours prior to arrival time.   NO  caffeine/decaffeinated products, or chocolate 12 hours prior to arrival.  3. NO perfume, cologne or lotion  4. Total time is 1 to 2 hours; you may want to bring reading material for the waiting time.  5. Please report to Radiology at the [facility] Main Entrance 30 minutes early for your test.  [place], [place] [id number]  Diabetic Preparation:  Hold oral medications. You may take NPH and Lantus insulin. Do not take Humalog or Humulin R (Regular Insulin) the day of your test. Check blood sugars prior to leaving the house. If able to eat breakfast prior to 3 hour fasting, you may take all medications, including your insulin, Do not worry if you miss your breakfast dose of insulin - start at your next meal.  IF YOU THINK YOU MAY BE PREGNANT, OR ARE NURSING PLEASE INFORM THE TECHNOLOGIST.  In preparation for your appointment, medication and supplies will be purchased.   Appointment availability is limited, so if you need to cancel or reschedule, please call the Radiology Department at [phone]  24 hours in advance to avoid a cancellation fee of $100.00  What to Expect After you Arrive:  Once you arrive and check in for your appointment, you will be taken to a preparation room within the Radiology Department.  A technologist or Nurse will obtain your medical history, verify that you are correctly prepped for the exam, and explain the procedure.  Afterwards,  an IV will be started in your arm and electrodes will be placed on your skin for EKG monitoring during the stress portion of the exam. Then you will be escorted to the PET/CT scanner.  There, staff will get you positioned on the scanner and obtain a blood pressure and EKG.  During the exam, you will continue to be connected to the EKG and blood pressure machines.  A small, safe amount of a radioactive tracer will be injected in your IV to obtain a series of pictures of your heart along with an injection of a stress agent.    After your Exam:  It is recommended that you eat a meal and drink a caffeinated beverage to counter act any effects of the stress agent.  Drink plenty of fluids for the remainder of the day and urinate frequently for the first couple of hours after the exam.  Your doctor will inform you of your test results within 7-10 business days.  For questions about your test or how to prepare for your test, please call: Marchia Bond, Cardiac Imaging Nurse Navigator  Gordy Clement, Cardiac Imaging Nurse Navigator Office: [phone]    Follow-Up: At [facility], you and your health needs are our priority.  As part of our continuing mission to provide you with exceptional heart care, we have created designated Provider Care Teams.  These Care Teams include your primary Cardiologist (physician) and Advanced Practice Providers (APPs -  Physician Assistants and Nurse Practitioners) who all  work together to provide you with the care you need, when you need it.  We recommend signing up for the patient portal called "MyChart".  Sign up information is provided on this After Visit Summary.  MyChart is used to connect with patients for Virtual Visits (Telemedicine).  Patients are able to view lab/test results, encounter notes, upcoming appointments, etc.  Non-urgent messages can be sent to your provider as well.   To learn more about what you can do with MyChart, go to [email or web address].    Your next  appointment:   Call us if you need [place], if we see anything concerning on PET Scan we will bring you back in!     Signed, Freada Bergeron, MD  03/30/2022 2:43 PM    [place]

## 2022-03-29 DIAGNOSIS — H353114 Nonexudative age-related macular degeneration, right eye, advanced atrophic with subfoveal involvement: Secondary | ICD-10-CM | POA: Diagnosis not present

## 2022-03-29 DIAGNOSIS — D3132 Benign neoplasm of left choroid: Secondary | ICD-10-CM | POA: Diagnosis not present

## 2022-03-29 DIAGNOSIS — H43813 Vitreous degeneration, bilateral: Secondary | ICD-10-CM | POA: Diagnosis not present

## 2022-03-29 DIAGNOSIS — H353123 Nonexudative age-related macular degeneration, left eye, advanced atrophic without subfoveal involvement: Secondary | ICD-10-CM | POA: Diagnosis not present

## 2022-03-30 ENCOUNTER — Encounter (HOSPITAL_BASED_OUTPATIENT_CLINIC_OR_DEPARTMENT_OTHER): Payer: Self-pay | Admitting: Cardiology

## 2022-03-30 ENCOUNTER — Ambulatory Visit (INDEPENDENT_AMBULATORY_CARE_PROVIDER_SITE_OTHER): Payer: Medicare Other | Admitting: Cardiology

## 2022-03-30 VITALS — BP 110/68 | HR 68 | Ht 71.0 in | Wt 165.8 lb

## 2022-03-30 DIAGNOSIS — E782 Mixed hyperlipidemia: Secondary | ICD-10-CM

## 2022-03-30 DIAGNOSIS — R0609 Other forms of dyspnea: Secondary | ICD-10-CM

## 2022-03-30 DIAGNOSIS — I10 Essential (primary) hypertension: Secondary | ICD-10-CM | POA: Diagnosis not present

## 2022-03-30 NOTE — Patient Instructions (Signed)
Medication Instructions:  Your physician recommends that you continue on your current medications as directed. Please refer to the Current Medication list given to you today.  *If you need a refill on your cardiac medications before your next appointment, please call your pharmacy*  Testing/Procedures: How to Prepare for Your Cardiac PET/CT Stress Test:  1. Please do not take these medications before your test:   Medications that may interfere with the cardiac pharmacological stress agent (ex. nitrates - including erectile dysfunction medications, isosorbide mononitrate, tamulosin or beta-blockers) the day of the exam. (Erectile dysfunction medication should be held for at least 72 hrs prior to test) Theophylline containing medications for 12 hours. Dipyridamole 48 hours prior to the test. Your remaining medications may be taken with water.  2. Nothing to eat or drink, except water, 3 hours prior to arrival time.   NO caffeine/decaffeinated products, or chocolate 12 hours prior to arrival.  3. NO perfume, cologne or lotion  4. Total time is 1 to 2 hours; you may want to bring reading material for the waiting time.  5. Please report to Radiology at the [facility] Main Entrance 30 minutes early for your test.  [place], [place] [address]  Diabetic Preparation:  Hold oral medications. You may take NPH and Lantus insulin. Do not take Humalog or Humulin R (Regular Insulin) the day of your test. Check blood sugars prior to leaving the house. If able to eat breakfast prior to 3 hour fasting, you may take all medications, including your insulin, Do not worry if you miss your breakfast dose of insulin - start at your next meal.  IF YOU THINK YOU MAY BE PREGNANT, OR ARE NURSING PLEASE INFORM THE TECHNOLOGIST.  In preparation for your appointment, medication and supplies will be purchased.  Appointment availability is limited, so if you need to cancel or  reschedule, please call the Radiology Department at [phone]  24 hours in advance to avoid a cancellation fee of $100.00  What to Expect After you Arrive:  Once you arrive and check in for your appointment, you will be taken to a preparation room within the Radiology Department.  A technologist or Nurse will obtain your medical history, verify that you are correctly prepped for the exam, and explain the procedure.  Afterwards,  an IV will be started in your arm and electrodes will be placed on your skin for EKG monitoring during the stress portion of the exam. Then you will be escorted to the PET/CT scanner.  There, staff will get you positioned on the scanner and obtain a blood pressure and EKG.  During the exam, you will continue to be connected to the EKG and blood pressure machines.  A small, safe amount of a radioactive tracer will be injected in your IV to obtain a series of pictures of your heart along with an injection of a stress agent.    After your Exam:  It is recommended that you eat a meal and drink a caffeinated beverage to counter act any effects of the stress agent.  Drink plenty of fluids for the remainder of the day and urinate frequently for the first couple of hours after the exam.  Your doctor will inform you of your test results within 7-10 business days.  For questions about your test or how to prepare for your test, please call: Marchia Bond, Cardiac Imaging Nurse Navigator  Gordy Clement, Cardiac Imaging Nurse Navigator Office: [phone]    Follow-Up: At  [facility], you and your health needs are our priority.  As part of our continuing mission to provide you with exceptional heart care, we have created designated Provider Care Teams.  These Care Teams include your primary Cardiologist (physician) and Advanced Practice Providers (APPs -  Physician Assistants and Nurse Practitioners) who all work together to provide you with the care you need, when you  need it.  We recommend signing up for the patient portal called "MyChart".  Sign up information is provided on this After Visit Summary.  MyChart is used to connect with patients for Virtual Visits (Telemedicine).  Patients are able to view lab/test results, encounter notes, upcoming appointments, etc.  Non-urgent messages can be sent to your provider as well.   To learn more about what you can do with MyChart, go to [email or web address].    Your next appointment:   Call us if you need [place], if we see anything concerning on PET Scan we will bring you back in!

## 2022-04-10 NOTE — Progress Notes (Unsigned)
Location:  [facility]  POS: Clinic  Provider: Fletcher Anon, ANP  Code Status: DNR Goals of Care:     12/07/2021    1:36 PM  Advanced Directives  Does Patient Have a Medical Advance Directive? Yes  Type of Estate agent of [place];Living will;Out of facility DNR (pink MOST or yellow form)  Does patient want to make changes to medical advance directive? No - Patient declined  Copy of Healthcare Power of Attorney in Chart? No - copy requested     No chief complaint on file.   HPI: Patient is a 89 y.o. male seen today for medical management of chronic diseases.     Hx of tonsillar ca s/p radiation, follow up with oncology once a year Sarcoma of the RLE s/p XRT followed by [facility]. Lymphedema of the left leg Reports aching in right leg Sees Dr Kathi Ludwig due to hx of aortitis syndrome.   Depression: ***   Has CKD and left renal artery stenosis with aneurysm, followed by Dr Signe Colt.   HTN:   Saw cardiology for DOE and fatigue, NM PET ordered.   OSA using cpap, followed by neurology  Past Medical History:  Diagnosis Date   Age-related macular degeneration, dry, both eyes    Aortitis syndrome (HCC) rheumotologist-  dr syed   IgG4 syndrome--  (effects abdomine) ,  previously long term use prednisone last taken 07/ 2021   Basal cell carcinoma (BCC) of eye    CKD (chronic kidney disease), stage III [facility])    nephrologist-- dr Bea Laura. Signe Colt   Complication of anesthesia    post op acute urinary retention   DDD (degenerative disc disease)    Fatigue    pt evalulation by cardiology--- dr Mackey Birchwood, note 05-21-2019 in epic,(non cardiac)  normal echo and event monitor showed extra beats of atrial tachycardia , no atrial fib, and rare ecotpy   Full dentures    GERD (gastroesophageal reflux disease)    History of basal cell carcinoma (BCC) excision    12-25-2018 MOH's reconstruction left lower eyelid   History of external beam radiation therapy 2002   prostate  cancer  and boost with radioative prostate seed implants   History of prostate cancer DX  2002   S/P EXTERNAL RADIATION/ RADIOACTIVE SEED IMPLANTS  2003   History of squamous cell carcinoma excision    2012--  RIGHT LOWER EXTREM;  2019 LEFT LOWER EXTREMITIY   HTN (hypertension)    followed by pcp   Hyperlipidemia    Hypothyroidism    followed by pcp--- acquired atrophy due to radiation;  previously seen by endocrinologist-  dr Talmage Nap--- per pt takes his own thyroid med. called NP Thyroid 90 mg daily, does not take synthroid   Large vessel vasculitis (HCC)    rheumotologist-  dr syed --- IgG4 related aoritis involving renal arteries ,  pt treated with predisone.  (09-06-2019  per pt no longer prednisone last taken one month ago 07/ 2021, weaned off by pcp per pt request)   Left renal artery stenosis [facility])    nephrologist--- dr Signe Colt--- with aneurysm proximal    Lymphedema of left leg    followed by [facility]---- pt stated uses compression hose   OSA (obstructive sleep apnea) 09-06-2019  per pt lasted used cpap approx. 2019, stated felt he no longer needed    pt retested 11-13-2016 at [facility] w/ dr ather-- moderate to severe OSA , AHI 16.4/h/  03-24-2017    Pulmonary nodule  followed by [facility]   per CT 06-11-2019  in Care Everywhere in epic done at [facility],  LUL nodule , not mets   Renal cyst, right    Restless legs syndrome (RLS)    S/P radiation therapy     for tonsillar cancer (head and neck) completed 01-26-2012 at [facility]);   and XRT for right lower leg sarcoma --- completed 06-06-2017   Saliva decreased    Sarcoma of lower extremity, right (HCC) oncolgoist-  dr Genella Mech ([facility]) & Dr Leane Platt ([facility])   dx 02-14-2017 w/ needle core bx;  high grade pleomorphic spindle cell sarcoma , grade 2 (cT2N0M0); 03-15-2017  radical resection sarcoma tumor right lower leg  and completed radiation 06-06-2017   Self-catheterizes urinary bladder    QID   AND    PRN   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+) (Stage IVA)---- dx oct 2013  ----s/p left tonsillectomy and concurent chemo and radiation/  ended 01-26-2012-- no surgical intervention---  residuals ( dry mouth, decreased saliva)   oncologist at [facility]--  dr brizel--  HX OF -- NO RECURRENCE   Urethral false passage 07/2019   Urethral stricture urologist--- dr Annabell Howells   chronic---  post urethral dilation's   Urinary retention with incomplete bladder emptying    Vitamin B 12 deficiency 01/28/2011   Vitamin D deficiency     Past Surgical History:  Procedure Laterality Date   BALLOON DILATION N/A 11/10/2014   Procedure: CYSTO BALLOON DILATION AND RETROGRADE URETHROGRAM ;  Surgeon: Alfredo Martinez, MD;  Location: [facility];  Service: Urology;  Laterality: N/A;   CATARACT EXTRACTION W/ INTRAOCULAR LENS  IMPLANT, BILATERAL  2012  approx   CYSTO/ BALLOON DILATION OF  URETHRAL STRICTURE  12-25-2010   CYSTOSCOPY WITH RETROGRADE URETHROGRAM N/A 03/30/2016   Procedure: CYSTOSCOPY WITH RETROGRADE URETHROGRAM AND BALLOON DILATION with cystogram;  Surgeon: Alfredo Martinez, MD;  Location: [facility];  Service: Urology;  Laterality: N/A;   CYSTOSCOPY WITH URETHRAL DILATATION  05/31/2011   Procedure: CYSTOSCOPY WITH URETHRAL DILATATION;  Surgeon: Martina Sinner, MD;  Location[facility];  Service: Urology;  Laterality: N/A;  BALLOON DILATION   CYSTOSCOPY WITH URETHRAL DILATATION N/A 09/13/2016   Procedure: CYSTOSCOPY WITH URETHRAL BALLOON DILATATION;  Surgeon: Alfredo Martinez, MD;  Location: [facility];  Service: Urology;  Laterality: N/A;   CYSTOSCOPY WITH URETHRAL DILATATION N/A 03/30/2017   Procedure: CYSTOSCOPY WITH BALLOON URETHRAL DILATATION;  Surgeon: Alfredo Martinez, MD;  Location: [facility];  Service: Urology;  Laterality: N/A;   CYSTOSCOPY WITH URETHRAL DILATATION  N/A 09/12/2019   Procedure: CYSTOSCOPY WITH URETHRAL DILATATION;  Surgeon: Bjorn Pippin, MD;  Location: [facility];  Service: Urology;  Laterality: N/A;   CYSTOSCOPY/RETROGRADE/URETEROSCOPY N/A 05/22/2012   Procedure: CYSTOSCOPY BALLOON DILATION RETROGRADE URETEROGRAM ;  Surgeon: Martina Sinner, MD;  Location: [facility];  Service: Urology;  Laterality: N/A;   CYTSO/  DILATATION URETHRAL STRICTURE/  BX PROSTATIC URETHRA/  REMOVAL FOREIGN BODIES  06-25-2010   [facility]   ECTROPION REPAIR Left 06/18/2019   lower eyelid   INGUINAL HERNIA REPAIR Right 2000   MOHS SURGERY  01/ 2019    [facility]   left lower leg for [facility]   MOHS SURGERY  12/25/2018   for [facility] of left lower eyelid;  the second stage Hughs flap release 01-09-2019   RADICAL RESECTION OF SARCOMA TUMOR  03-15-2017   [facility]   RIGHT LOWER LEG , CALF AREA   RADIOACTIVE SEED IMPLANTS, PROSTATE  JAN  2003   SKIN LESION EXCISION  07/2012   MOST  right shoulder   TONSILLECTOMY Left 11-10-2011  @[facility]    tonsillar cancer   TRANSURETHRAL INCISION OF BLADDER NECK N/A 09/12/2019   Procedure: TRANSURETHRAL INCISION OF BLADDER NECK;  Surgeon: Bjorn Pippin, MD;  Location: [facility];  Service: Urology;  Laterality: N/A;    Allergies  Allergen Reactions   Cefixime Rash   Contrast Media [Iodinated Contrast Media] Other (See Comments)    Avoid due to renal disease   Macrobid [Nitrofurantoin Macrocrystal] Swelling   Nsaids Other (See Comments)    Has been told no NSAIDs, Ibuprofen Renal insufficiency hx.    Cephalosporins Rash    Outpatient Encounter Medications as of 04/11/2022  Medication Sig   buPROPion (WELLBUTRIN) 100 MG tablet Take 1 tablet by mouth twice daily   Cholecalciferol (VITAMIN D) 50 MCG (2000 UT) CAPS Take by mouth every other day.   Cranberry-Vitamin C (AZO CRANBERRY URINARY TRACT PO) Take by mouth.   cyanocobalamin (VITAMIN B12) 1000 MCG/ML injection INJECT 1 ML INTRAMUSCULARLY  FOR 14 DAYS    Garlic (GARLIQUE PO) Take by mouth every other day.   lisinopril (ZESTRIL) 10 MG tablet Take 1 tablet by mouth once daily   NP THYROID 120 MG tablet Take 120 mg by mouth. 6 days a week   RESVERATROL PO Take 100 mg by mouth every other day.   SYRINGE-NEEDLE, DISP, 3 ML (BD ECLIPSE SYRINGE/NEEDLE) 25G X 5/8" 3 ML MISC One injection every 14 days   TURMERIC CURCUMIN PO Take 2 tablets by mouth daily.    UNABLE TO FIND [place] take once daily   UNABLE TO FIND [place] take 4 tablets once daily   UNABLE TO FIND Comprehensive immune support take 2 tablets once daily   UNABLE TO FIND Modified citrus pectin take 4 tablets once daily   UNABLE TO FIND Celery seed take 2 tablets by mouth once daily   UNABLE TO FIND Maxivision multiple vitamin for eyes   No facility-administered encounter medications on file as of 04/11/2022.    Review of Systems:  Review of Systems  Constitutional:  Positive for fatigue. Negative for activity change, appetite change, chills, diaphoresis, fever and unexpected weight change.  HENT:  Negative for sinus pressure, sneezing and trouble swallowing.        Jaw hurts on the left sometimes.    Respiratory:  Negative for cough, shortness of breath, wheezing and stridor.   Cardiovascular:  Positive for leg swelling. Negative for chest pain and palpitations.  Gastrointestinal:  Negative for abdominal distention, abdominal pain, constipation and diarrhea.  Genitourinary:  Negative for difficulty urinating and dysuria.  Musculoskeletal:  Negative for arthralgias, back pain, gait problem, joint swelling and myalgias.  Neurological:  Negative for dizziness, seizures, syncope, facial asymmetry, speech difficulty, weakness and headaches.  Hematological:  Negative for adenopathy. Does not bruise/bleed easily.  Psychiatric/Behavioral:  Positive for dysphoric mood. Negative for agitation, behavioral problems, confusion and suicidal ideas.     Health Maintenance  Topic Date  Due   DTaP/Tdap/Td (2 - Tdap) 01/17/2013   Medicare Annual Wellness (AWV)  11/13/2020   COVID-19 Vaccine (6 - 2023-24 season) 01/12/2022   Pneumonia Vaccine 28+ Years old  Completed   HPV VACCINES  Aged Out   INFLUENZA VACCINE  Discontinued   Zoster Vaccines- Shingrix  Discontinued  Physical Exam: There were no vitals filed for this visit.  There is no height or weight on file to calculate BMI. Wt Readings from Last 3 Encounters:  03/30/22 165 lb 12.8 oz (75.2 kg)  01/03/22 165 lb 8 oz (75.1 kg)  12/07/21 166 lb 3.2 oz (75.4 kg)    Physical Exam Vitals and nursing note reviewed.  Constitutional:      General: He is not in acute distress.    Appearance: He is not diaphoretic.  HENT:     Head: Normocephalic and atraumatic.     Right Ear: Tympanic membrane normal.     Left Ear: Tympanic membrane normal.     Nose: Nose normal.     Mouth/Throat:     Mouth: Mucous membranes are moist.     Pharynx: Oropharynx is clear.  Neck:     Thyroid: No thyromegaly.     Vascular: No JVD.     Trachea: No tracheal deviation.  Cardiovascular:     Rate and Rhythm: Normal rate and regular rhythm.     Heart sounds: No murmur heard. Pulmonary:     Effort: Pulmonary effort is normal. No respiratory distress.     Breath sounds: Normal breath sounds. No wheezing.  Abdominal:     General: Bowel sounds are normal. There is no distension.     Palpations: Abdomen is soft.     Tenderness: There is no abdominal tenderness.  Musculoskeletal:     Cervical back: Normal range of motion and neck supple.     Right lower leg: No edema.     Left lower leg: Edema present.  Lymphadenopathy:     Cervical: No cervical adenopathy.  Skin:    General: Skin is warm and dry.  Neurological:     Mental Status: He is alert and oriented to person, place, and time.     Cranial Nerves: No cranial nerve deficit.  Psychiatric:     Comments: Flat but more smiles than usual     Labs reviewed: Basic Metabolic  Panel: Recent Labs    07/29/21 0000 11/04/21 0000 03/11/22 0000  NA 142 139 137  K 4.7 4.9 4.7  CL 105 101 101  CO2 23* 26* 25*  BUN 31* 26* 30*  CREATININE 1.7* 1.5* 1.5*  CALCIUM 9.0 9.2  9.2 9.0  TSH 0.97  --   --     Liver Function Tests: Recent Labs    11/04/21 0000 03/11/22 0000  AST 17  --   ALT 14  --   ALKPHOS 59  --   ALBUMIN 3.8  3.8 4.2    No results for input(s): "LIPASE", "AMYLASE" in the last 8760 hours. No results for input(s): "AMMONIA" in the last 8760 hours. CBC: Recent Labs    07/29/21 0000 11/04/21 0000 03/11/22 0000  WBC 4.3 4.9 4.7  NEUTROABS  --   --  2.60  HGB 13.2* 13.5 13.8  HCT 39* 40* 40*  PLT 224 195 200    Lipid Panel: No results for input(s): "CHOL", "HDL", "LDLCALC", "TRIG", "CHOLHDL", "LDLDIRECT" in the last 8760 hours.  No results found for: "HGBA1C"  Procedures since last visit: No results found.  Assessment/Plan  1. Fatigue, unspecified type Likely due to underlying depression and sleep apnea which is being treated by neurology Will check labs.   2. Essential hypertension Recommending taking zestril only if SBP 150 or greater due to low reading.  3. Recurrent major depressive disorder, in partial remission (HCC) Encouraged him to  take the wellbutrin twice daily for mood as prescribed.   4. Hypothyroidism due to acquired atrophy of thyroid Check TSH On armour thyroid  5. Stage 3a chronic kidney disease (HCC) With left renal artery stenosis Followed by renal   6. Lymphedema of left leg Recommend knee high compression   7. Large vessel vasculitis (HCC) Followed by Dr Kathi Ludwig Not currently on prednisone or having any issues.    Labs/tests ordered:  * No order type specified *  Next appt:  4 months with Dr Chales Abrahams   Total time :  time greater than 50% of total time spent doing pt counseling and coordination of care

## 2022-04-11 ENCOUNTER — Encounter: Payer: Medicare Other | Admitting: Adult Health

## 2022-04-14 DIAGNOSIS — L218 Other seborrheic dermatitis: Secondary | ICD-10-CM | POA: Diagnosis not present

## 2022-04-14 DIAGNOSIS — L578 Other skin changes due to chronic exposure to nonionizing radiation: Secondary | ICD-10-CM | POA: Diagnosis not present

## 2022-04-14 DIAGNOSIS — L570 Actinic keratosis: Secondary | ICD-10-CM | POA: Diagnosis not present

## 2022-04-22 ENCOUNTER — Other Ambulatory Visit: Payer: Self-pay | Admitting: Internal Medicine

## 2022-05-02 DIAGNOSIS — H353114 Nonexudative age-related macular degeneration, right eye, advanced atrophic with subfoveal involvement: Secondary | ICD-10-CM | POA: Diagnosis not present

## 2022-05-02 DIAGNOSIS — H43813 Vitreous degeneration, bilateral: Secondary | ICD-10-CM | POA: Diagnosis not present

## 2022-05-02 DIAGNOSIS — H353123 Nonexudative age-related macular degeneration, left eye, advanced atrophic without subfoveal involvement: Secondary | ICD-10-CM | POA: Diagnosis not present

## 2022-05-09 ENCOUNTER — Encounter (HOSPITAL_BASED_OUTPATIENT_CLINIC_OR_DEPARTMENT_OTHER): Payer: Self-pay | Admitting: Cardiology

## 2022-05-09 NOTE — Telephone Encounter (Signed)
Pt. Last seen by Dr. Shari Prows, please follow up with patient in regards to PET SCAN.

## 2022-05-11 NOTE — Telephone Encounter (Signed)
RE: status of when CARDIAC PET SCAN will be scheduled Received: Today Loma Sender, LPN Patient is scheduled for 06/01/22.

## 2022-05-13 ENCOUNTER — Encounter: Payer: Self-pay | Admitting: Adult Health

## 2022-05-13 ENCOUNTER — Ambulatory Visit (INDEPENDENT_AMBULATORY_CARE_PROVIDER_SITE_OTHER): Payer: Medicare Other | Admitting: Adult Health

## 2022-05-13 VITALS — BP 90/62 | HR 74 | Temp 97.7°F | Resp 17 | Ht 71.0 in | Wt 152.0 lb

## 2022-05-13 DIAGNOSIS — F331 Major depressive disorder, recurrent, moderate: Secondary | ICD-10-CM | POA: Diagnosis not present

## 2022-05-13 DIAGNOSIS — G4733 Obstructive sleep apnea (adult) (pediatric): Secondary | ICD-10-CM

## 2022-05-13 DIAGNOSIS — E861 Hypovolemia: Secondary | ICD-10-CM | POA: Diagnosis not present

## 2022-05-13 DIAGNOSIS — N1831 Chronic kidney disease, stage 3a: Secondary | ICD-10-CM | POA: Diagnosis not present

## 2022-05-13 DIAGNOSIS — R531 Weakness: Secondary | ICD-10-CM

## 2022-05-13 DIAGNOSIS — E034 Atrophy of thyroid (acquired): Secondary | ICD-10-CM

## 2022-05-13 LAB — CBC WITH DIFFERENTIAL/PLATELET
Eosinophils Relative: 0.9 %
Monocytes Relative: 12.1 %
Neutro Abs: 5321 cells/uL (ref 1500–7800)
Platelets: 280 10*3/uL (ref 140–400)
Total Lymphocyte: 14.6 %

## 2022-05-13 NOTE — Progress Notes (Signed)
[facility] clinic  Provider:  Kenard Gower DNP  Code Status:  DNR  Goals of Care:     05/13/2022    2:28 PM  Advanced Directives  Does Patient Have a Medical Advance Directive? Yes  Type of Estate agent of [place];Living will;Out of facility DNR (pink MOST or yellow form)  Does patient want to make changes to medical advance directive? No - Patient declined     Chief Complaint  Patient presents with   Acute Visit    Patient states he was having pain in his hips for a few days and now his legs is weak. Patient states he had a BP of 84/66 when he checked it.    HPI: Patient is a 89 y.o. male seen today for an acute visit for feeling weak. He stated that he ate breakfast Monday (today is Friday). He  doesn't recall eating again till until Thursday ( 2 days later). Today, he ate breakfast and lunch. He was able to balance on 1 foot. He said that he walks with wide gait and thinks he has steady gait. He denies falling. BP 90/62. He denies being dizzy. He said that he has stopped taking Lisinopril for a week now.   Past Medical History:  Diagnosis Date   Age-related macular degeneration, dry, both eyes    Aortitis syndrome (HCC) rheumotologist-  dr syed   IgG4 syndrome--  (effects abdomine) ,  previously long term use prednisone last taken 07/ 2021   Basal cell carcinoma (BCC) of eye    CKD (chronic kidney disease), stage III [facility])    nephrologist-- dr Bea Laura. Signe Colt   Complication of anesthesia    post op acute urinary retention   DDD (degenerative disc disease)    Fatigue    pt evalulation by cardiology--- dr Mackey Birchwood, note 05-21-2019 in epic,(non cardiac)  normal echo and event monitor showed extra beats of atrial tachycardia , no atrial fib, and rare ecotpy   Full dentures    GERD (gastroesophageal reflux disease)    History of basal cell carcinoma (BCC) excision    12-25-2018 MOH's reconstruction left lower eyelid   History of external beam  radiation therapy 2002   prostate cancer  and boost with radioative prostate seed implants   History of prostate cancer DX  2002   S/P EXTERNAL RADIATION/ RADIOACTIVE SEED IMPLANTS  2003   History of squamous cell carcinoma excision    2012--  RIGHT LOWER EXTREM;  2019 LEFT LOWER EXTREMITIY   HTN (hypertension)    followed by pcp   Hyperlipidemia    Hypothyroidism    followed by pcp--- acquired atrophy due to radiation;  previously seen by endocrinologist-  dr Talmage Nap--- per pt takes his own thyroid med. called NP Thyroid 90 mg daily, does not take synthroid   Large vessel vasculitis (HCC)    rheumotologist-  dr syed --- IgG4 related aoritis involving renal arteries ,  pt treated with predisone.  (09-06-2019  per pt no longer prednisone last taken one month ago 07/ 2021, weaned off by pcp per pt request)   Left renal artery stenosis [facility])    nephrologist--- dr Signe Colt--- with aneurysm proximal    Lymphedema of left leg    followed by [facility]---- pt stated uses compression hose   OSA (obstructive sleep apnea) 09-06-2019  per pt lasted used cpap approx. 2019, stated felt he no longer needed    pt retested 11-13-2016 at [facility] w/ dr  ather-- moderate to severe OSA , AHI 16.4/h/  03-24-2017    Pulmonary nodule followed by [facility]   per CT 06-11-2019  in Care Everywhere in epic done at [facility],  LUL nodule , not mets   Renal cyst, right    Restless legs syndrome (RLS)    S/P radiation therapy     for tonsillar cancer (head and neck) completed 01-26-2012 at [facility]);   and XRT for right lower leg sarcoma --- completed 06-06-2017   Saliva decreased    Sarcoma of lower extremity, right (HCC) oncolgoist-  dr Genella Mech ([facility]) & Dr Leane Platt ([facility])   dx 02-14-2017 w/ needle core bx;  high grade pleomorphic spindle cell sarcoma , grade 2 (cT2N0M0); 03-15-2017  radical resection sarcoma tumor right lower leg  and completed radiation 06-06-2017    Self-catheterizes urinary bladder    QID   AND   PRN   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+) (Stage IVA)---- dx oct 2013  ----s/p left tonsillectomy and concurent chemo and radiation/  ended 01-26-2012-- no surgical intervention---  residuals ( dry mouth, decreased saliva)   oncologist at [facility]--  dr brizel--  HX OF -- NO RECURRENCE   Urethral false passage 07/2019   Urethral stricture urologist--- dr Annabell Howells   chronic---  post urethral dilation's   Urinary retention with incomplete bladder emptying    Vitamin B 12 deficiency 01/28/2011   Vitamin D deficiency     Past Surgical History:  Procedure Laterality Date   BALLOON DILATION N/A 11/10/2014   Procedure: CYSTO BALLOON DILATION AND RETROGRADE URETHROGRAM ;  Surgeon: Alfredo Martinez, MD;  Location: [facility];  Service: Urology;  Laterality: N/A;   CATARACT EXTRACTION W/ INTRAOCULAR LENS  IMPLANT, BILATERAL  2012  approx   CYSTO/ BALLOON DILATION OF  URETHRAL STRICTURE  12-25-2010   CYSTOSCOPY WITH RETROGRADE URETHROGRAM N/A 03/30/2016   Procedure: CYSTOSCOPY WITH RETROGRADE URETHROGRAM AND BALLOON DILATION with cystogram;  Surgeon: Alfredo Martinez, MD;  Location: [facility];  Service: Urology;  Laterality: N/A;   CYSTOSCOPY WITH URETHRAL DILATATION  05/31/2011   Procedure: CYSTOSCOPY WITH URETHRAL DILATATION;  Surgeon: Martina Sinner, MD;  Location: [facility];  Service: Urology;  Laterality: N/A;  BALLOON DILATION   CYSTOSCOPY WITH URETHRAL DILATATION N/A 09/13/2016   Procedure: CYSTOSCOPY WITH URETHRAL BALLOON DILATATION;  Surgeon: Alfredo Martinez, MD;  Location: [facility];  Service: Urology;  Laterality: N/A;   CYSTOSCOPY WITH URETHRAL DILATATION N/A 03/30/2017   Procedure: CYSTOSCOPY WITH BALLOON URETHRAL DILATATION;  Surgeon: Alfredo Martinez, MD;  Location: [facility];  Service: Urology;   Laterality: N/A;   CYSTOSCOPY WITH URETHRAL DILATATION N/A 09/12/2019   Procedure: CYSTOSCOPY WITH URETHRAL DILATATION;  Surgeon: Bjorn Pippin, MD;  Location: [facility];  Service: Urology;  Laterality: N/A;   CYSTOSCOPY/RETROGRADE/URETEROSCOPY N/A 05/22/2012   Procedure: CYSTOSCOPY BALLOON DILATION RETROGRADE URETEROGRAM ;  Surgeon: Martina Sinner, MD;  Location: [facility] ;  Service: Urology;  Laterality: N/A;   CYTSO/  DILATATION URETHRAL STRICTURE/  BX PROSTATIC URETHRA/  REMOVAL FOREIGN BODIES  06-25-2010   [facility]   ECTROPION REPAIR Left 06/18/2019   lower eyelid   INGUINAL HERNIA REPAIR Right 2000   MOHS SURGERY  01/ 2019    [facility]   left lower leg for [facility]   MOHS SURGERY  12/25/2018   for [facility] of left lower eyelid;  the second stage Hughs flap release 01-09-2019   RADICAL RESECTION OF SARCOMA TUMOR   03-15-2017   [facility]   RIGHT LOWER LEG , CALF AREA   RADIOACTIVE SEED IMPLANTS, PROSTATE  JAN  2003   SKIN LESION EXCISION  07/2012   MOST  right shoulder   TONSILLECTOMY Left 11-10-2011  @[facility]    tonsillar cancer   TRANSURETHRAL INCISION OF BLADDER NECK N/A 09/12/2019   Procedure: TRANSURETHRAL INCISION OF BLADDER NECK;  Surgeon: Bjorn Pippin, MD;  Location: [facility];  Service: Urology;  Laterality: N/A;    Allergies  Allergen Reactions   Cefixime Rash   Contrast Media [Iodinated Contrast Media] Other (See Comments)    Avoid due to renal disease   Macrobid [Nitrofurantoin Macrocrystal] Swelling   Nsaids Other (See Comments)    Has been told no NSAIDs, Ibuprofen Renal insufficiency hx.    Cephalosporins Rash    Outpatient Encounter Medications as of 05/13/2022  Medication Sig   buPROPion (WELLBUTRIN) 100 MG tablet Take 1 tablet by mouth twice daily   Cholecalciferol (VITAMIN D) 50 MCG (2000 UT) CAPS Take by mouth every other day.   Cranberry-Vitamin C (AZO CRANBERRY URINARY TRACT PO) Take by mouth.   cyanocobalamin (VITAMIN B12) 1000  MCG/ML injection INJECT 1 ML INTRAMUSCULARLY  FOR 14 DAYS   Garlic (GARLIQUE PO) Take by mouth every other day.   ketoconazole (NIZORAL) 2 % cream Apply 1 Application topically daily.   lisinopril (ZESTRIL) 10 MG tablet Take 1 tablet by mouth once daily   NP THYROID 120 MG tablet Take 120 mg by mouth. 6 days a week   RESVERATROL PO Take 100 mg by mouth every other day.   SYRINGE-NEEDLE, DISP, 3 ML (B-D 3CC LUER-LOK SYR 25GX5/8") 25G X 5/8" 3 ML MISC USE FOR INJECTION EVERY 14 DAYS   TURMERIC CURCUMIN PO Take 2 tablets by mouth daily.    UNABLE TO FIND [place] take once daily   UNABLE TO FIND [place] take 4 tablets once daily   UNABLE TO FIND Comprehensive immune support take 2 tablets once daily   UNABLE TO FIND Modified citrus pectin take 4 tablets once daily   UNABLE TO FIND Celery seed take 2 tablets by mouth once daily   UNABLE TO FIND Maxivision multiple vitamin for eyes   No facility-administered encounter medications on file as of 05/13/2022.    Review of Systems:  Review of Systems  Constitutional:  Positive for appetite change. Negative for activity change and fever.  HENT:  Negative for sore throat.   Eyes: Negative.   Cardiovascular:  Negative for chest pain and leg swelling.  Gastrointestinal:  Negative for abdominal distention, diarrhea and vomiting.  Genitourinary:  Negative for dysuria, frequency and urgency.  Skin:  Negative for color change.  Neurological:  Negative for dizziness and headaches.  Psychiatric/Behavioral:  Negative for behavioral problems and sleep disturbance. The patient is not nervous/anxious.     Health Maintenance  Topic Date Due   COVID-19 Vaccine (6 - 2023-24 season) 01/12/2022   Medicare Annual Wellness (AWV)  06/12/2022 (Originally 11/13/2020)   Pneumonia Vaccine 41+ Years old  Completed   HPV VACCINES  Aged Out   DTaP/Tdap/Td  Discontinued   INFLUENZA VACCINE  Discontinued   Zoster Vaccines- Shingrix  Discontinued    Physical  Exam: Vitals:   05/13/22 1425  Pulse: 74  Resp: 17  Temp: 97.7 F (36.5 C)  TempSrc: Temporal  SpO2: 94%  Weight: 152 lb (68.9 kg)  Height: 5\' 11"  (1.803 m)   Body mass index is 21.2 kg/m. Physical Exam Constitutional:      Appearance: Normal appearance.  HENT:     Head: Normocephalic and atraumatic.     Mouth/Throat:     Mouth: Mucous membranes are moist.  Eyes:     Conjunctiva/sclera: Conjunctivae normal.  Cardiovascular:     Rate and Rhythm: Normal rate and regular rhythm.     Pulses: Normal pulses.     Heart sounds: Normal heart sounds.  Pulmonary:     Effort: Pulmonary effort is normal.     Breath sounds: Normal breath sounds.  Abdominal:     General: Bowel sounds are normal.     Palpations: Abdomen is soft.  Musculoskeletal:        General: Swelling present. Normal range of motion.     Cervical back: Normal range of motion.     Left lower leg: Edema present.     Comments: LLE 3+edema  Skin:    General: Skin is warm and dry.  Neurological:     General: No focal deficit present.     Mental Status: He is alert and oriented to person, place, and time.  Psychiatric:        Mood and Affect: Mood normal.        Behavior: Behavior normal.        Thought Content: Thought content normal.        Judgment: Judgment normal.    Labs reviewed: Basic Metabolic Panel: Recent Labs    07/29/21 0000 11/04/21 0000 03/11/22 0000  NA 142 139 137  K 4.7 4.9 4.7  CL 105 101 101  CO2 23* 26* 25*  BUN 31* 26* 30*  CREATININE 1.7* 1.5* 1.5*  CALCIUM 9.0 9.2  9.2 9.0  TSH 0.97  --   --    Liver Function Tests: Recent Labs    11/04/21 0000 03/11/22 0000  AST 17  --   ALT 14  --   ALKPHOS 59  --   ALBUMIN 3.8  3.8 4.2   No results for input(s): "LIPASE", "AMYLASE" in the last 8760 hours. No results for input(s): "AMMONIA" in the last 8760 hours. CBC: Recent Labs    07/29/21 0000 11/04/21 0000 03/11/22 0000  WBC 4.3 4.9 4.7  NEUTROABS  --   --  2.60  HGB  13.2* 13.5 13.8  HCT 39* 40* 40*  PLT 224 195 200   Lipid Panel: No results for input(s): "CHOL", "HDL", "LDLCALC", "TRIG", "CHOLHDL", "LDLDIRECT" in the last 8760 hours. No results found for: "HGBA1C"  Procedures since last visit: No results found.  Assessment/Plan  1. Generalized weakness -  possibly from poor oral intake -  instructed to drink fluids and increase food intake - CBC With Differential/Platelet - Basic Metabolic Panel with eGFR  2. Hypotension due to hypovolemia -  discontinue Lisinopril -  monitor BP  3. Hypothyroidism due to acquired atrophy of thyroid Lab Results  Component Value Date   TSH 0.97 07/29/2021    -  continue NP Thyroid 4. Stage 3a chronic kidney disease [facility]) Lab Results  Component Value Date   NA 137 03/11/2022   K 4.7 03/11/2022   CO2 25 (A) 03/11/2022   GLUCOSE 102 (H) 09/12/2019   BUN 30 (A) 03/11/2022   CREATININE 1.5 (A) 03/11/2022   CALCIUM 9.0 03/11/2022   EGFR 45 03/11/2022   GFRNONAA 34 (L) 08/24/2017    - will re-check BMP  5. OSA (  obstructive sleep apnea) -  continue CPAP at bedtime  6. Moderate episode of recurrent major depressive disorder (HCC) -  feels depressed but does not have plans of hurting self -  continue Wllbutrin   Labs/tests ordered:  CBC and BMP  Next appt:  Visit date not found

## 2022-05-14 LAB — CBC WITH DIFFERENTIAL/PLATELET
Absolute Monocytes: 895 cells/uL (ref 200–950)
Basophils Absolute: 37 cells/uL (ref 0–200)
Basophils Relative: 0.5 %
Eosinophils Absolute: 67 cells/uL (ref 15–500)
HCT: 34.7 % — ABNORMAL LOW (ref 38.5–50.0)
Hemoglobin: 11.8 g/dL — ABNORMAL LOW (ref 13.2–17.1)
Lymphs Abs: 1080 cells/uL (ref 850–3900)
MCH: 31.7 pg (ref 27.0–33.0)
MCHC: 34 g/dL (ref 32.0–36.0)
MCV: 93.3 fL (ref 80.0–100.0)
MPV: 9.8 fL (ref 7.5–12.5)
Neutrophils Relative %: 71.9 %
RBC: 3.72 10*6/uL — ABNORMAL LOW (ref 4.20–5.80)
RDW: 12.2 % (ref 11.0–15.0)
WBC: 7.4 10*3/uL (ref 3.8–10.8)

## 2022-05-14 LAB — BASIC METABOLIC PANEL WITH GFR
BUN/Creatinine Ratio: 22 (calc) (ref 6–22)
BUN: 38 mg/dL — ABNORMAL HIGH (ref 7–25)
CO2: 29 mmol/L (ref 20–32)
Calcium: 8.3 mg/dL — ABNORMAL LOW (ref 8.6–10.3)
Chloride: 101 mmol/L (ref 98–110)
Creat: 1.72 mg/dL — ABNORMAL HIGH (ref 0.70–1.22)
Glucose, Bld: 106 mg/dL — ABNORMAL HIGH (ref 65–99)
Potassium: 5.2 mmol/L (ref 3.5–5.3)
Sodium: 137 mmol/L (ref 135–146)
eGFR: 38 mL/min/{1.73_m2} — ABNORMAL LOW (ref >=60)

## 2022-05-16 ENCOUNTER — Telehealth: Payer: Self-pay

## 2022-05-16 NOTE — Telephone Encounter (Signed)
Patient decided to make appointment to further discuss labs

## 2022-05-16 NOTE — Telephone Encounter (Signed)
Patient called and left message on clinical intake voicemail. He would like to know about his tests results and what needs to be done. Please advise.  Message routed to Dr. Einar Crow

## 2022-05-16 NOTE — Telephone Encounter (Signed)
Can you let him know that his labs show that he was some dehydrated. But all other labs were stable  If he still has concerns to come and see me in clinic Tomorrow.

## 2022-05-17 ENCOUNTER — Non-Acute Institutional Stay: Payer: Medicare Other | Admitting: Internal Medicine

## 2022-05-17 ENCOUNTER — Encounter: Payer: Self-pay | Admitting: Internal Medicine

## 2022-05-17 ENCOUNTER — Telehealth: Payer: Self-pay

## 2022-05-17 VITALS — BP 138/86 | HR 71 | Temp 98.0°F | Resp 16 | Ht 71.0 in | Wt 155.0 lb

## 2022-05-17 DIAGNOSIS — E538 Deficiency of other specified B group vitamins: Secondary | ICD-10-CM | POA: Diagnosis not present

## 2022-05-17 DIAGNOSIS — R1013 Epigastric pain: Secondary | ICD-10-CM

## 2022-05-17 DIAGNOSIS — F3341 Major depressive disorder, recurrent, in partial remission: Secondary | ICD-10-CM | POA: Diagnosis not present

## 2022-05-17 DIAGNOSIS — E034 Atrophy of thyroid (acquired): Secondary | ICD-10-CM | POA: Diagnosis not present

## 2022-05-17 DIAGNOSIS — N1832 Chronic kidney disease, stage 3b: Secondary | ICD-10-CM

## 2022-05-17 DIAGNOSIS — I10 Essential (primary) hypertension: Secondary | ICD-10-CM | POA: Diagnosis not present

## 2022-05-17 DIAGNOSIS — R531 Weakness: Secondary | ICD-10-CM | POA: Diagnosis not present

## 2022-05-17 DIAGNOSIS — R42 Dizziness and giddiness: Secondary | ICD-10-CM

## 2022-05-17 DIAGNOSIS — G4733 Obstructive sleep apnea (adult) (pediatric): Secondary | ICD-10-CM | POA: Diagnosis not present

## 2022-05-17 DIAGNOSIS — R110 Nausea: Secondary | ICD-10-CM | POA: Diagnosis not present

## 2022-05-17 MED ORDER — CYANOCOBALAMIN 1000 MCG/ML IJ SOLN
INTRAMUSCULAR | 1 refills | Status: DC
Start: 2022-05-17 — End: 2023-01-02

## 2022-05-17 MED ORDER — BD LUER-LOK SYRINGE 25G X 5/8" 3 ML MISC
1 refills | Status: AC
Start: 2022-05-17 — End: ?

## 2022-05-17 MED ORDER — OMEPRAZOLE 40 MG PO CPDR: 40.0000 mg | DELAYED_RELEASE_CAPSULE | Freq: Every day | ORAL | 3 refills | Status: DC

## 2022-05-17 NOTE — Telephone Encounter (Signed)
Autumn, independent living nurse with [facility] called on behalf of the patient to request a refill on b12 injection and syringes. RX's sent as requested

## 2022-05-17 NOTE — Progress Notes (Signed)
-    hgb 11.8, dropped from 13.8, stable  wbc 7.4, not elevated -   GFR 38, down from 45, creatinine 1.72 and BUN 38, possibly due to dehydration. Is he drinking and eating regularly now? Needs to follow up if still not eating.

## 2022-05-17 NOTE — Progress Notes (Signed)
Location: [facility] features editor of Service:  Clinic (12)  Provider:   Code Status: *** Goals of Care:     05/17/2022    1:53 PM  Advanced Directives  Does Patient Have a Medical Advance Directive? Yes  Type of Estate agent of [place];Living will;Out of facility DNR (pink MOST or yellow form)  Does patient want to make changes to medical advance directive? No - Patient declined  Copy of Healthcare Power of Attorney in Chart? No - copy requested     Chief Complaint  Patient presents with  . Acute Visit    Patient is here to discuss labs and still not feeling well    HPI: Patient is a 89 y.o. male seen today for an acute visit for  Past Medical History:  Diagnosis Date  . Age-related macular degeneration, dry, both eyes   . Aortitis syndrome (HCC) rheumotologist-  dr syed   IgG4 syndrome--  (effects abdomine) ,  previously long term use prednisone last taken 07/ 2021  . Basal cell carcinoma (BCC) of eye   . CKD (chronic kidney disease), stage III [facility])    nephrologist-- dr Bea Laura. Signe Colt  . Complication of anesthesia    post op acute urinary retention  . DDD (degenerative disc disease)   . Fatigue    pt evalulation by cardiology--- dr Mackey Birchwood, note 05-21-2019 in epic,(non cardiac)  normal echo and event monitor showed extra beats of atrial tachycardia , no atrial fib, and rare ecotpy  . Full dentures   . GERD (gastroesophageal reflux disease)   . History of basal cell carcinoma (BCC) excision    12-25-2018 MOH's reconstruction left lower eyelid  . History of external beam radiation therapy 2002   prostate cancer  and boost with radioative prostate seed implants  . History of prostate cancer DX  2002   S/P EXTERNAL RADIATION/ RADIOACTIVE SEED IMPLANTS  2003  . History of squamous cell carcinoma excision    2012--  RIGHT LOWER EXTREM;  2019 LEFT LOWER EXTREMITIY  . HTN (hypertension)    followed by pcp  . Hyperlipidemia   .  Hypothyroidism    followed by pcp--- acquired atrophy due to radiation;  previously seen by endocrinologist-  dr balan--- per pt takes his own thyroid med. called NP Thyroid 90 mg daily, does not take synthroid  . Large vessel vasculitis (HCC)    rheumotologist-  dr syed --- IgG4 related aoritis involving renal arteries ,  pt treated with predisone.  (09-06-2019  per pt no longer prednisone last taken one month ago 07/ 2021, weaned off by pcp per pt request)  . Left renal artery stenosis [facility])    nephrologist--- dr Signe Colt--- with aneurysm proximal   . Lymphedema of left leg    followed by [facility]---- pt stated uses compression hose  . OSA (obstructive sleep apnea) 09-06-2019  per pt lasted used cpap approx. 2019, stated felt he no longer needed    pt retested 11-13-2016 at [facility] w/ dr ather-- moderate to severe OSA , AHI 16.4/h/  03-24-2017   . Pulmonary nodule followed by [facility]   per CT 06-11-2019  in Care Everywhere in epic done at [facility],  LUL nodule , not mets  . Renal cyst, right   . Restless legs syndrome (RLS)   . S/P radiation therapy     for tonsillar cancer (head and neck) completed 01-26-2012 at [facility]);   and XRT for  right lower leg sarcoma --- completed 06-06-2017  . Saliva decreased   . Sarcoma of lower extremity, right (HCC) oncolgoist-  dr Genella Mech ([facility]) & Dr Lacretia Nicks. Ranae Palms ([facility])   dx 02-14-2017 w/ needle core bx;  high grade pleomorphic spindle cell sarcoma , grade 2 (cT2N0M0); 03-15-2017  radical resection sarcoma tumor right lower leg  and completed radiation 06-06-2017  . Self-catheterizes urinary bladder    QID   AND   PRN  . 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+) (Stage IVA)---- dx oct 2013  ----s/p left tonsillectomy and concurent chemo and radiation/  ended 01-26-2012-- no surgical intervention---  residuals ( dry mouth, decreased saliva)   oncologist at [facility]--  dr  brizel--  HX OF -- NO RECURRENCE  . Urethral false passage 07/2019  . Urethral stricture urologist--- dr Annabell Howells   chronic---  post urethral dilation's  . Urinary retention with incomplete bladder emptying   . Vitamin B 12 deficiency 01/28/2011  . Vitamin D deficiency     Past Surgical History:  Procedure Laterality Date  . BALLOON DILATION N/A 11/10/2014   Procedure: CYSTO BALLOON DILATION AND RETROGRADE URETHROGRAM ;  Surgeon: Alfredo Martinez, MD;  Location: [facility];  Service: Urology;  Laterality: N/A;  . CATARACT EXTRACTION W/ INTRAOCULAR LENS  IMPLANT, BILATERAL  2012  approx  . CYSTO/ BALLOON DILATION OF  URETHRAL STRICTURE  12-25-2010  . CYSTOSCOPY WITH RETROGRADE URETHROGRAM N/A 03/30/2016   Procedure: CYSTOSCOPY WITH RETROGRADE URETHROGRAM AND BALLOON DILATION with cystogram;  Surgeon: Alfredo Martinez, MD;  Location: [facility];  Service: Urology;  Laterality: N/A;  . CYSTOSCOPY WITH URETHRAL DILATATION  05/31/2011   Procedure: CYSTOSCOPY WITH URETHRAL DILATATION;  Surgeon: Martina Sinner, MD;  Location: [facility];  Service: Urology;  Laterality: N/A;  BALLOON DILATION  . CYSTOSCOPY WITH URETHRAL DILATATION N/A 09/13/2016   Procedure: CYSTOSCOPY WITH URETHRAL BALLOON DILATATION;  Surgeon: Alfredo Martinez, MD;  Location: [facility];  Service: Urology;  Laterality: N/A;  . CYSTOSCOPY WITH URETHRAL DILATATION N/A 03/30/2017   Procedure: CYSTOSCOPY WITH BALLOON URETHRAL DILATATION;  Surgeon: Alfredo Martinez, MD;  Location: [facility];  Service: Urology;  Laterality: N/A;  . CYSTOSCOPY WITH URETHRAL DILATATION N/A 09/12/2019   Procedure: CYSTOSCOPY WITH URETHRAL DILATATION;  Surgeon: Bjorn Pippin, MD;  Location: [facility];  Service: Urology;  Laterality: N/A;  . CYSTOSCOPY/RETROGRADE/URETEROSCOPY N/A 05/22/2012   Procedure: CYSTOSCOPY BALLOON DILATION RETROGRADE URETEROGRAM ;  Surgeon:  Martina Sinner, MD;  Location: [facility] [place];  Service: Urology;  Laterality: N/A;  . CYTSO/  DILATATION URETHRAL STRICTURE/  BX PROSTATIC URETHRA/  REMOVAL FOREIGN BODIES  06-25-2010   [facility]  . ECTROPION REPAIR Left 06/18/2019   lower eyelid  . INGUINAL HERNIA REPAIR Right 2000  . MOHS SURGERY  01/ 2019    [facility]   left lower leg for SCC  . MOHS SURGERY  12/25/2018   for [facility] of left lower eyelid;  the second stage Hughs flap release 01-09-2019  . RADICAL RESECTION OF SARCOMA TUMOR   03-15-2017   [facility]   RIGHT LOWER LEG , CALF AREA  . RADIOACTIVE SEED IMPLANTS, PROSTATE  JAN  2003  . SKIN LESION EXCISION  07/2012   MOST  right shoulder  . TONSILLECTOMY Left 11-10-2011  @[facility]    tonsillar cancer  . TRANSURETHRAL INCISION OF BLADDER NECK N/A 09/12/2019   Procedure: TRANSURETHRAL INCISION OF  BLADDER NECK;  Surgeon: Bjorn Pippin, MD;  [facility];  Service: Urology;  Laterality: N/A;    Allergies  Allergen Reactions  . Cefixime Rash  . Contrast Media [Iodinated Contrast Media] Other (See Comments)    Avoid due to renal disease  . Macrobid [Nitrofurantoin Macrocrystal] Swelling  . Nsaids Other (See Comments)    Has been told no NSAIDs, Ibuprofen Renal insufficiency hx.   . Cephalosporins Rash    Outpatient Encounter Medications as of 05/17/2022  Medication Sig  . buPROPion (WELLBUTRIN) 100 MG tablet Take 1 tablet by mouth twice daily  . Cholecalciferol (VITAMIN D) 50 MCG (2000 UT) CAPS Take by mouth every other day.  . Cranberry-Vitamin C (AZO CRANBERRY URINARY TRACT PO) Take by mouth.  . cyanocobalamin (VITAMIN B12) 1000 MCG/ML injection INJECT 1 ML INTRAMUSCULARLY  FOR 14 DAYS  . Garlic (GARLIQUE PO) Take by mouth every other day.  . ketoconazole (NIZORAL) 2 % cream Apply 1 Application topically daily.  . NP THYROID 120 MG tablet Take 120 mg by mouth. 6 days a week  . omeprazole (PRILOSEC) 40 MG capsule Take 1 capsule (40 mg total) by mouth  daily.  Marland Kitchen RESVERATROL PO Take 100 mg by mouth every other day.  . SYRINGE-NEEDLE, DISP, 3 ML (B-D 3CC LUER-LOK SYR 25GX5/8") 25G X 5/8" 3 ML MISC USE FOR INJECTION EVERY 14 DAYS  . TURMERIC CURCUMIN PO Take 2 tablets by mouth daily.   Marland Kitchen UNABLE TO FIND [place] take once daily  . UNABLE TO FIND [place] take 4 tablets once daily  . UNABLE TO FIND Comprehensive immune support take 2 tablets once daily  . UNABLE TO FIND Modified citrus pectin take 4 tablets once daily  . UNABLE TO FIND Celery seed take 2 tablets by mouth once daily  . UNABLE TO FIND Maxivision multiple vitamin for eyes   No facility-administered encounter medications on file as of 05/17/2022.    Review of Systems:  Review of Systems  Health Maintenance  Topic Date Due  . COVID-19 Vaccine (6 - 2023-24 season) 06/02/2022 (Originally 01/12/2022)  . Medicare Annual Wellness (AWV)  06/12/2022 (Originally 11/13/2020)  . Pneumonia Vaccine 7+ Years old  Completed  . HPV VACCINES  Aged Out  . DTaP/Tdap/Td  Discontinued  . INFLUENZA VACCINE  Discontinued  . Zoster Vaccines- Shingrix  Discontinued    Physical Exam: Vitals:   05/17/22 1351  BP: 138/86  Pulse: 71  Resp: 16  Temp: 98 F (36.7 C)  TempSrc: Temporal  SpO2: 98%  Weight: 155 lb (70.3 kg)  Height: 5\' 11"  (1.803 m)   Body mass index is 21.62 kg/m. Physical Exam  Labs reviewed: Basic Metabolic Panel: Recent Labs    07/29/21 0000 11/04/21 0000 03/11/22 0000 05/13/22 1502  NA 142 139 137 137  K 4.7 4.9 4.7 5.2  CL 105 101 101 101  CO2 23* 26* 25* 29  GLUCOSE  --   --   --  106*  BUN 31* 26* 30* 38*  CREATININE 1.7* 1.5* 1.5* 1.72*  CALCIUM 9.0 9.2  9.2 9.0 8.3*  TSH 0.97  --   --   --    Liver Function Tests: Recent Labs    11/04/21 0000 03/11/22 0000  AST 17  --   ALT 14  --   ALKPHOS 59  --   ALBUMIN 3.8  3.8 4.2   No results for input(s): "LIPASE", "AMYLASE" in the last 8760 hours. No results for input(s): "  AMMONIA" in the  last 8760 hours. CBC: Recent Labs    11/04/21 0000 03/11/22 0000 05/13/22 1502  WBC 4.9 4.7 7.4  NEUTROABS  --  2.60 5,321  HGB 13.5 13.8 11.8*  HCT 40* 40* 34.7*  MCV  --   --  93.3  PLT 195 200 280   Lipid Panel: No results for input(s): "CHOL", "HDL", "LDLCALC", "TRIG", "CHOLHDL", "LDLDIRECT" in the last 8760 hours. No results found for: "HGBA1C"  Procedures since last visit: No results found.  Assessment/Plan 1. Epigastric discomfort ? Gastritis Prilosec   2. Nausea ***  3. Generalized weakness ***  4. Dizziness ***  5. Essential hypertension ***  6. Stage 3b chronic kidney disease (HCC) ***  7. OSA (obstructive sleep apnea) ***  8. Recurrent major depressive disorder, in partial remission (HCC) ***  9. B12 deficiency ***  10. Hypothyroidism due to acquired atrophy of thyroid ***    Labs/tests ordered:  * No order type specified * Next appt:  05/17/2022

## 2022-05-25 DIAGNOSIS — R42 Dizziness and giddiness: Secondary | ICD-10-CM | POA: Diagnosis not present

## 2022-05-25 DIAGNOSIS — R2689 Other abnormalities of gait and mobility: Secondary | ICD-10-CM | POA: Diagnosis not present

## 2022-05-25 DIAGNOSIS — H8112 Benign paroxysmal vertigo, left ear: Secondary | ICD-10-CM | POA: Diagnosis not present

## 2022-05-27 ENCOUNTER — Other Ambulatory Visit: Payer: Self-pay | Admitting: Cardiology

## 2022-05-27 DIAGNOSIS — R0602 Shortness of breath: Secondary | ICD-10-CM

## 2022-05-30 ENCOUNTER — Telehealth (HOSPITAL_COMMUNITY): Payer: Self-pay | Admitting: *Deleted

## 2022-05-30 NOTE — Telephone Encounter (Signed)
Attempted to call patient regarding upcoming cardiac PET appointment. Left message on voicemail with name and callback number  Larey Brick RN Navigator Cardiac Imaging [facility] [phone] Office [phone] Cell  Reminder to avoid caffeine 12 hours prior to PET scan and to hold BP medication.

## 2022-06-01 ENCOUNTER — Encounter (HOSPITAL_COMMUNITY)
Admission: RE | Admit: 2022-06-01 | Discharge: 2022-06-01 | Disposition: A | Discharge status: Home / Self Care | Payer: Medicare Other | Source: Ambulatory Visit | Attending: Cardiology | Admitting: Cardiology

## 2022-06-01 DIAGNOSIS — R0609 Other forms of dyspnea: Secondary | ICD-10-CM | POA: Diagnosis not present

## 2022-06-01 DIAGNOSIS — L309 Dermatitis, unspecified: Secondary | ICD-10-CM | POA: Diagnosis not present

## 2022-06-01 DIAGNOSIS — L821 Other seborrheic keratosis: Secondary | ICD-10-CM | POA: Diagnosis not present

## 2022-06-01 DIAGNOSIS — L570 Actinic keratosis: Secondary | ICD-10-CM | POA: Diagnosis not present

## 2022-06-01 DIAGNOSIS — D1801 Hemangioma of skin and subcutaneous tissue: Secondary | ICD-10-CM | POA: Diagnosis not present

## 2022-06-01 LAB — NM PET CT CARDIAC PERFUSION MULTI W/ABSOLUTE BLOODFLOW
MBFR: 1.59
Nuc Rest EF: 45 %
Nuc Stress EF: 63 %
Rest MBF: 0.8 ml/g/min
Rest Nuclear Isotope Dose: 18.4 mCi
ST Depression (mm): 0 mm
Stress MBF: 1.27 ml/g/min
Stress Nuclear Isotope Dose: 18.3 mCi

## 2022-06-01 MED ORDER — RUBIDIUM RB82 GENERATOR (RUBYFILL)
18.2900 | PACK | Freq: Once | INTRAVENOUS | Status: AC
  Administered 2022-06-01: 18.29 via INTRAVENOUS

## 2022-06-01 MED ORDER — REGADENOSON 0.4 MG/5ML IV SOLN
0.4000 mg | Freq: Once | INTRAVENOUS | Status: AC
  Administered 2022-06-01: 0.4 mg via INTRAVENOUS

## 2022-06-01 MED ORDER — RUBIDIUM RB82 GENERATOR (RUBYFILL)
18.3800 | PACK | Freq: Once | INTRAVENOUS | Status: AC
  Administered 2022-06-01: 18.38 via INTRAVENOUS

## 2022-06-01 MED ORDER — REGADENOSON 0.4 MG/5ML IV SOLN
INTRAVENOUS | Status: AC
  Filled 2022-06-01: qty 5

## 2022-06-02 ENCOUNTER — Telehealth: Payer: Self-pay | Admitting: *Deleted

## 2022-06-02 DIAGNOSIS — R0609 Other forms of dyspnea: Secondary | ICD-10-CM

## 2022-06-02 DIAGNOSIS — R0602 Shortness of breath: Secondary | ICD-10-CM

## 2022-06-02 NOTE — Telephone Encounter (Signed)
-----   Message from Meriam Sprague, MD sent at 06/02/2022  3:32 PM EDT ----- Discussed with the patient at length. Will check TTE and if reassuring, plan to proceed with conservative management.

## 2022-06-02 NOTE — Telephone Encounter (Signed)
Message Received: Michele Rockers, MD  Loa Socks, LPN His stress test shows mildly reduced pumping function and abnormal blood flow down the artery territories. This can commonly be seen in patient's with kidney disease but we cannot rule out artery disease without further evaluation. How is he feeling? If his fatigue is progressing, we may need to pursue with cath. If he is stable, can plan for echo to assess pumping function and if it is reduced, we can discuss next steps.    Pt made aware by Dr. Shari Prows that we will order for him to get an echo done.  He is aware that he will receive a callback from our echo scheduler soon to get this arranged.  Pt verbalized understanding and agrees with this plan.

## 2022-06-06 DIAGNOSIS — H353132 Nonexudative age-related macular degeneration, bilateral, intermediate dry stage: Secondary | ICD-10-CM | POA: Diagnosis not present

## 2022-06-06 DIAGNOSIS — H1820 Unspecified corneal edema: Secondary | ICD-10-CM | POA: Diagnosis not present

## 2022-06-28 ENCOUNTER — Encounter: Payer: Medicare Other | Admitting: Internal Medicine

## 2022-07-01 ENCOUNTER — Other Ambulatory Visit (HOSPITAL_BASED_OUTPATIENT_CLINIC_OR_DEPARTMENT_OTHER): Payer: Self-pay

## 2022-07-01 DIAGNOSIS — Z23 Encounter for immunization: Secondary | ICD-10-CM | POA: Diagnosis not present

## 2022-07-01 MED ORDER — COMIRNATY 30 MCG/0.3ML IM SUSY
0.5000 mL | PREFILLED_SYRINGE | INTRAMUSCULAR | 0 refills | Status: DC
  Filled 2022-07-01: qty 0.3, 1d supply, fill #0

## 2022-07-04 ENCOUNTER — Other Ambulatory Visit (HOSPITAL_BASED_OUTPATIENT_CLINIC_OR_DEPARTMENT_OTHER): Payer: Self-pay

## 2022-07-06 DIAGNOSIS — L853 Xerosis cutis: Secondary | ICD-10-CM | POA: Diagnosis not present

## 2022-07-06 DIAGNOSIS — L820 Inflamed seborrheic keratosis: Secondary | ICD-10-CM | POA: Diagnosis not present

## 2022-07-06 DIAGNOSIS — L570 Actinic keratosis: Secondary | ICD-10-CM | POA: Diagnosis not present

## 2022-07-07 ENCOUNTER — Telehealth: Payer: Self-pay | Admitting: *Deleted

## 2022-07-07 ENCOUNTER — Ambulatory Visit (HOSPITAL_COMMUNITY): Payer: Medicare Other | Attending: Cardiology

## 2022-07-07 ENCOUNTER — Encounter (HOSPITAL_COMMUNITY): Payer: Self-pay | Admitting: Radiology

## 2022-07-07 DIAGNOSIS — R0609 Other forms of dyspnea: Secondary | ICD-10-CM | POA: Insufficient documentation

## 2022-07-07 DIAGNOSIS — R0602 Shortness of breath: Secondary | ICD-10-CM | POA: Insufficient documentation

## 2022-07-07 LAB — ECHOCARDIOGRAM COMPLETE
AR max vel: 2.1 cm2
AV Area VTI: 2.1 cm2
AV Area mean vel: 2.03 cm2
AV Mean grad: 4.7 mmHg
AV Peak grad: 9.1 mmHg
Ao pk vel: 1.51 m/s
Area-P 1/2: 3.5 cm2
S' Lateral: 2.6 cm

## 2022-07-07 NOTE — Progress Notes (Signed)
Patient's blood pressure was elevated during echocardiogram 150/106 by automatic blood pressure cuff and 150/70 manually. Patient has not been taken blood pressure medicine routinely due to low blood pressure readings at home.

## 2022-07-07 NOTE — Telephone Encounter (Signed)
Triage team, do you mind calling the patient and asking him to keep a 5 day BP log at home for [place] to review.  Patient notified.  He will contact office with readings

## 2022-07-08 ENCOUNTER — Telehealth: Payer: Self-pay | Admitting: *Deleted

## 2022-07-08 DIAGNOSIS — N35012 Post-traumatic membranous urethral stricture: Secondary | ICD-10-CM | POA: Diagnosis not present

## 2022-07-08 DIAGNOSIS — I7121 Aneurysm of the ascending aorta, without rupture: Secondary | ICD-10-CM

## 2022-07-08 DIAGNOSIS — N3020 Other chronic cystitis without hematuria: Secondary | ICD-10-CM | POA: Diagnosis not present

## 2022-07-08 DIAGNOSIS — N1831 Chronic kidney disease, stage 3a: Secondary | ICD-10-CM

## 2022-07-08 DIAGNOSIS — Z961 Presence of intraocular lens: Secondary | ICD-10-CM | POA: Diagnosis not present

## 2022-07-08 DIAGNOSIS — I77819 Aortic ectasia, unspecified site: Secondary | ICD-10-CM

## 2022-07-08 DIAGNOSIS — H353133 Nonexudative age-related macular degeneration, bilateral, advanced atrophic without subfoveal involvement: Secondary | ICD-10-CM | POA: Diagnosis not present

## 2022-07-08 DIAGNOSIS — H52203 Unspecified astigmatism, bilateral: Secondary | ICD-10-CM | POA: Diagnosis not present

## 2022-07-08 NOTE — Telephone Encounter (Signed)
The patient has been notified of the result and verbalized understanding.  All questions (if any) were answered.  Pt aware we will order for him to get a CT ANGIO AORTA done to better define his aorta measurements.   He is aware that I will place the order and have our [facility] Scheduling team reach out to him shortly to get this appt arranged.  Pt states he will be leaving town on next Friday and will be gone for 16 days.  Pt aware that I will endorse this to the scheduling team so that they can arrange the appt prior to him leaving the state.  Pt verbalized understanding and agrees with this plan.   Went to place the CT ANGIO AORTA order in the system and a hard stop received stating this test is contraindicated due to pts renal disease.   Called Dr. Shari Prows about this and she now recommends that he get an MRA OF CHEST  instead to further evaluate the aorta.   Will place the MRA order and have our [facility] Scheduling team reach out to him to schedule this appt.

## 2022-07-08 NOTE — Telephone Encounter (Signed)
-----   Message from Meriam Sprague, MD sent at 07/08/2022  3:04 PM EDT ----- His echo shows normal pumping function. There are mildly elevated pressures in his lungs. There is moderate dilation of his aorta measuring 49mm. To assess this dilation further, can we get a CTA aorta as this is more of a definitive test to measure the aorta size. We asked him to keep a BP log as well to ensure it is not running high on him at home.  How is he feeling?

## 2022-07-11 ENCOUNTER — Telehealth: Payer: Self-pay | Admitting: Cardiology

## 2022-07-11 NOTE — Telephone Encounter (Signed)
Pt was calling to say that he can hear his heart beating when he lies on his left side and cannot on the right side.   He states he has zero cardiac complaints, but was wandering if this is normal to hear.   Pt adds that he will be sending Dr. Shari Prows his BP/HR log at the end of this week, as she advised him to do last week.  He states his pressures are running 130/90 and a little less.  Heart rates staying in the 50s, which he reports baseline for him.  Advised the pt that lying on one side or moving the head or neck in a certain position may provoke or intensify the sound of his heart beat.  Informed him it may sound like you hear your heart beat in your ear.  Informed him that lying on the left side can also stimulate the vagus nerve.  Informed him that it is a harmless reaction and we would do nothing for this unless he became symptomatic.   Pt still denies cardiac symptoms.   Advised the pt to proceed with logging his BP/HR at home and send those accordingly to [place] via mychart this week.  Pt education provided on this.  Advised him to continue monitoring and report any cardiac symptoms to [place] as needed.   Informed him that I will make Dr. Shari Prows aware of this message, for any further advisement as needed on this matter.   Pt verbalized understanding and agrees with this plan.

## 2022-07-11 NOTE — Telephone Encounter (Signed)
Patient c/o Palpitations:  High priority if patient c/o lightheadedness, shortness of breath, or chest pain  How long have you had palpitations/irregular HR/ Afib? Are you having the symptoms now?   Pt states his heart was beating irregularly this morning. He states he was lying on his side this morning and he could hear his heart beating. He states he heard a gap between the beats.   Are you currently experiencing lightheadedness, SOB or CP? No   Do you have a history of afib (atrial fibrillation) or irregular heart rhythm? No   Have you checked your BP or HR? (document readings if available): did not check   Are you experiencing any other symptoms? No

## 2022-07-12 NOTE — Telephone Encounter (Signed)
Philip [name], [place] - 07/11/2022  8:58 AM Meriam Sprague, MD  Sent: Mon July 11, 2022  8:49 PM  To: Loa Socks, LPN         Message  Agree with your plan. It is not abnormal to hear the pulse in your ear. It is louder/more prominent if blood pressure is high or the patient is dehydrated.

## 2022-07-12 NOTE — Telephone Encounter (Signed)
Pt aware of Dr. Devin Going response from yesterday's conversation, and as indicated in this message.  Pt verbalized understanding and agrees with this plan.

## 2022-07-14 ENCOUNTER — Ambulatory Visit (HOSPITAL_COMMUNITY)
Admission: RE | Admit: 2022-07-14 | Discharge: 2022-07-14 | Disposition: A | Discharge status: Home / Self Care | Payer: Medicare Other | Source: Ambulatory Visit | Attending: Cardiology | Admitting: Cardiology

## 2022-07-14 DIAGNOSIS — I77819 Aortic ectasia, unspecified site: Secondary | ICD-10-CM | POA: Insufficient documentation

## 2022-07-14 DIAGNOSIS — I7121 Aneurysm of the ascending aorta, without rupture: Secondary | ICD-10-CM | POA: Insufficient documentation

## 2022-07-14 DIAGNOSIS — N1831 Chronic kidney disease, stage 3a: Secondary | ICD-10-CM | POA: Insufficient documentation

## 2022-07-14 MED ORDER — GADOBUTROL 1 MMOL/ML IV SOLN
7.0000 mL | Freq: Once | INTRAVENOUS | Status: AC | PRN
  Administered 2022-07-14: 7 mL via INTRAVENOUS

## 2022-07-20 ENCOUNTER — Telehealth: Payer: Self-pay | Admitting: Cardiology

## 2022-07-20 DIAGNOSIS — I7121 Aneurysm of the ascending aorta, without rupture: Secondary | ICD-10-CM

## 2022-07-20 NOTE — Telephone Encounter (Signed)
Spoke with patient and discussed results of MRA. Patient verbalized understanding and wishes to proceed with repeat MRA in 6 months for close monitoring of dilated aorta.  Patient states he understands Dr. Shari Prows will be leaving our practice at the end of the month and would like to know who she recommends he follow-up with. Advised patient to look at our website to review the profiles/reviews on other providers at our office.  Patient states he would appreciation Dr. Devin Going recommendation. Will forward to Dr. Shari Prows.  MRA ordered and scheduler will contact patient to schedule. Due January 2025.

## 2022-07-20 NOTE — Telephone Encounter (Signed)
-----   Message from Meriam Sprague, MD sent at 07/19/2022 10:45 AM EDT ----- His aorta measures 4.5cm on the MRA which is more accurate than the echo. This is moderately dilated but not at the point of needing to be fixed. The goal is to keep the blood pressure controlled to prevent it from dilating further. Also want to ensure he is not doing heavy lifting (no lifting <25lbs) to prevent too much force against that aorta. They recommend repeating the scan in 6months which we can order. Does he want a referral to CT surgery or is he comfortable with [place] monitoring closely and only refer if the aorta is enlarging in size?

## 2022-08-23 DIAGNOSIS — N35012 Post-traumatic membranous urethral stricture: Secondary | ICD-10-CM | POA: Diagnosis not present

## 2022-08-23 DIAGNOSIS — N5201 Erectile dysfunction due to arterial insufficiency: Secondary | ICD-10-CM | POA: Diagnosis not present

## 2022-08-23 DIAGNOSIS — N3020 Other chronic cystitis without hematuria: Secondary | ICD-10-CM | POA: Diagnosis not present

## 2022-08-23 DIAGNOSIS — R3914 Feeling of incomplete bladder emptying: Secondary | ICD-10-CM | POA: Diagnosis not present

## 2022-09-01 DIAGNOSIS — H43813 Vitreous degeneration, bilateral: Secondary | ICD-10-CM | POA: Diagnosis not present

## 2022-09-01 DIAGNOSIS — D3132 Benign neoplasm of left choroid: Secondary | ICD-10-CM | POA: Diagnosis not present

## 2022-09-01 DIAGNOSIS — H353114 Nonexudative age-related macular degeneration, right eye, advanced atrophic with subfoveal involvement: Secondary | ICD-10-CM | POA: Diagnosis not present

## 2022-09-01 DIAGNOSIS — H353123 Nonexudative age-related macular degeneration, left eye, advanced atrophic without subfoveal involvement: Secondary | ICD-10-CM | POA: Diagnosis not present

## 2022-09-21 DIAGNOSIS — M19071 Primary osteoarthritis, right ankle and foot: Secondary | ICD-10-CM | POA: Diagnosis not present

## 2022-09-21 DIAGNOSIS — Z85831 Personal history of malignant neoplasm of soft tissue: Secondary | ICD-10-CM | POA: Diagnosis not present

## 2022-09-21 DIAGNOSIS — C4921 Malignant neoplasm of connective and soft tissue of right lower limb, including hip: Secondary | ICD-10-CM | POA: Diagnosis not present

## 2022-09-21 DIAGNOSIS — Z08 Encounter for follow-up examination after completed treatment for malignant neoplasm: Secondary | ICD-10-CM | POA: Diagnosis not present

## 2022-09-21 DIAGNOSIS — M1711 Unilateral primary osteoarthritis, right knee: Secondary | ICD-10-CM | POA: Diagnosis not present

## 2022-09-21 DIAGNOSIS — C499 Malignant neoplasm of connective and soft tissue, unspecified: Secondary | ICD-10-CM | POA: Diagnosis not present

## 2022-09-22 DIAGNOSIS — H353114 Nonexudative age-related macular degeneration, right eye, advanced atrophic with subfoveal involvement: Secondary | ICD-10-CM | POA: Diagnosis not present

## 2022-09-22 DIAGNOSIS — D3132 Benign neoplasm of left choroid: Secondary | ICD-10-CM | POA: Diagnosis not present

## 2022-09-22 DIAGNOSIS — H43813 Vitreous degeneration, bilateral: Secondary | ICD-10-CM | POA: Diagnosis not present

## 2022-09-22 DIAGNOSIS — H353123 Nonexudative age-related macular degeneration, left eye, advanced atrophic without subfoveal involvement: Secondary | ICD-10-CM | POA: Diagnosis not present

## 2022-09-26 ENCOUNTER — Encounter: Payer: Self-pay | Admitting: Adult Health

## 2022-09-26 ENCOUNTER — Non-Acute Institutional Stay: Payer: Medicare Other | Admitting: Adult Health

## 2022-09-26 VITALS — BP 132/86 | HR 68 | Temp 97.7°F | Resp 17 | Ht 71.0 in | Wt 159.2 lb

## 2022-09-26 DIAGNOSIS — E782 Mixed hyperlipidemia: Secondary | ICD-10-CM

## 2022-09-26 DIAGNOSIS — F5101 Primary insomnia: Secondary | ICD-10-CM | POA: Diagnosis not present

## 2022-09-26 DIAGNOSIS — I10 Essential (primary) hypertension: Secondary | ICD-10-CM | POA: Diagnosis not present

## 2022-09-26 DIAGNOSIS — G4733 Obstructive sleep apnea (adult) (pediatric): Secondary | ICD-10-CM

## 2022-09-26 MED ORDER — TEMAZEPAM 7.5 MG PO CAPS: 7.5000 mg | ORAL_CAPSULE | Freq: Every evening | ORAL | 0 refills | Status: DC | PRN

## 2022-09-26 NOTE — Progress Notes (Signed)
Location:  [facility]  POS: Clinic  Provider: Fletcher Anon, ANP   Goals of Care:     05/17/2022    1:53 PM  Advanced Directives  Does Patient Have a Medical Advance Directive? Yes  Type of Estate agent of [place];Living will;Out of facility DNR (pink MOST or yellow form)  Does patient want to make changes to medical advance directive? No - Patient declined  Copy of Healthcare Power of Attorney in Chart? No - copy requested     Chief Complaint  Patient presents with   Acute Visit    Patient is being seen for no energy and not enough sleep    Immunizations    Patient is due for a covid vaccine     HPI: Patient is a 89 y.o. [name] seen today for medical management of chronic diseases.    Hx of tonsillar ca s/p radiation, follow up with oncology once a year Sarcoma of the RLE s/p XRT followed by [facility]. Lymphedema of the left leg Sees Dr Kathi Ludwig due to hx of aortitis syndrome.    Reports walking 25 yards and then feels tired, no sob. He has been reporting fatigue and insomnia for a few years.  He can't quantify how long he sleeps. He does not feel rested when he wakes up.  Lies awake with eyes open on and off for hrs. Reports that he is compliant with the CPAP machine.  Saw Cardiology in 2021 and then 03/30/22 for fatigue with exertion and had a work up and  has a dilated Aorta, needs MRA monitoring.  No identified reason for the fatigue.  Has tried and failed Remeron, trazodone, and melatonin  BP is controlled, not running low as before. Take zestril qd  Past Medical History:  Diagnosis Date   Age-related macular degeneration, dry, both eyes    Aortitis syndrome (HCC) rheumotologist-  dr syed   IgG4 syndrome--  (effects abdomine) ,  previously long term use prednisone last taken 07/ 2021   Basal cell carcinoma (BCC) of eye    CKD (chronic kidney disease), stage III [facility])    nephrologist-- dr Bea Laura. Signe Colt   Complication of anesthesia    post op acute  urinary retention   DDD (degenerative disc disease)    Fatigue    pt evalulation by cardiology--- dr Mackey Birchwood, note 05-21-2019 in epic,(non cardiac)  normal echo and event monitor showed extra beats of atrial tachycardia , no atrial fib, and rare ecotpy   Full dentures    GERD (gastroesophageal reflux disease)    History of basal cell carcinoma (BCC) excision    12-25-2018 MOH's reconstruction left lower eyelid   History of external beam radiation therapy 2002   prostate cancer  and boost with radioative prostate seed implants   History of prostate cancer DX  2002   S/P EXTERNAL RADIATION/ RADIOACTIVE SEED IMPLANTS  2003   History of squamous cell carcinoma excision    2012--  RIGHT LOWER EXTREM;  2019 LEFT LOWER EXTREMITIY   HTN (hypertension)    followed by pcp   Hyperlipidemia    Hypothyroidism    followed by pcp--- acquired atrophy due to radiation;  previously seen by endocrinologist-  dr Talmage Nap--- per pt takes his own thyroid med. called NP Thyroid 90 mg daily, does not take synthroid   Large vessel vasculitis (HCC)    rheumotologist-  dr syed --- IgG4 related aoritis involving renal arteries ,  pt treated with predisone.  (09-06-2019  per  pt no longer prednisone last taken one month ago 07/ 2021, weaned off by pcp per pt request)   Left renal artery stenosis [facility])    nephrologist--- dr Signe Colt--- with aneurysm proximal    Lymphedema of left leg    followed by [facility]---- pt stated uses compression hose   OSA (obstructive sleep apnea) 09-06-2019  per pt lasted used cpap approx. 2019, stated felt he no longer needed    pt retested 11-13-2016 at [facility] w/ dr ather-- moderate to severe OSA , AHI 16.4/h/  03-24-2017    Pulmonary nodule followed by [facility]   per CT 06-11-2019  in Care Everywhere in epic done at [facility],  LUL nodule , not mets   Renal cyst, right    Restless legs syndrome (RLS)    S/P radiation therapy     for tonsillar cancer (head  and neck) completed 01-26-2012 at [facility]);   and XRT for right lower leg sarcoma --- completed 06-06-2017   Saliva decreased    Sarcoma of lower extremity, right (HCC) oncolgoist-  dr Genella Mech ([facility]) & Dr Leane Platt ([facility])   dx 02-14-2017 w/ needle core bx;  high grade pleomorphic spindle cell sarcoma , grade 2 (cT2N0M0); 03-15-2017  radical resection sarcoma tumor right lower leg  and completed radiation 06-06-2017   Self-catheterizes urinary bladder    QID   AND   PRN   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+) (Stage IVA)---- dx oct 2013  ----s/p left tonsillectomy and concurent chemo and radiation/  ended 01-26-2012-- no surgical intervention---  residuals ( dry mouth, decreased saliva)   oncologist at [facility]--  dr brizel--  HX OF -- NO RECURRENCE   Urethral false passage 07/2019   Urethral stricture urologist--- dr Annabell Howells   chronic---  post urethral dilation's   Urinary retention with incomplete bladder emptying    Vitamin B 12 deficiency 01/28/2011   Vitamin D deficiency     Past Surgical History:  Procedure Laterality Date   BALLOON DILATION N/A 11/10/2014   Procedure: CYSTO BALLOON DILATION AND RETROGRADE URETHROGRAM ;  Surgeon: Alfredo Martinez, MD;  Location: [facility];  Service: Urology;  Laterality: N/A;   CATARACT EXTRACTION W/ INTRAOCULAR LENS  IMPLANT, BILATERAL  2012  approx   CYSTO/ BALLOON DILATION OF  URETHRAL STRICTURE  12-25-2010   CYSTOSCOPY WITH RETROGRADE URETHROGRAM N/A 03/30/2016   Procedure: CYSTOSCOPY WITH RETROGRADE URETHROGRAM AND BALLOON DILATION with cystogram;  Surgeon: Alfredo Martinez, MD;  Location: [facility];  Service: Urology;  Laterality: N/A;   CYSTOSCOPY WITH URETHRAL DILATATION  05/31/2011   Procedure: CYSTOSCOPY WITH URETHRAL DILATATION;  Surgeon: Martina Sinner, MD;  Location: [facility];  Service: Urology;  Laterality: N/A;   BALLOON DILATION   CYSTOSCOPY WITH URETHRAL DILATATION N/A 09/13/2016   Procedure: CYSTOSCOPY WITH URETHRAL BALLOON DILATATION;  Surgeon: Alfredo Martinez, MD;  Location: [facility];  Service: Urology;  Laterality: N/A;   CYSTOSCOPY WITH URETHRAL DILATATION N/A 03/30/2017   Procedure: CYSTOSCOPY WITH BALLOON URETHRAL DILATATION;  Surgeon: Alfredo Martinez, MD;  Location: [facility] ;  Service: Urology;  Laterality: N/A;   CYSTOSCOPY WITH URETHRAL DILATATION N/A 8/Philip/2021   Procedure: CYSTOSCOPY WITH URETHRAL DILATATION;  Surgeon: Bjorn Pippin, MD;  Location: [facility];  Service: Urology;  Laterality: N/A;   CYSTOSCOPY/RETROGRADE/URETEROSCOPY N/A 05/22/2012   Procedure: CYSTOSCOPY BALLOON DILATION RETROGRADE URETEROGRAM ;  Surgeon: Martina Sinner, MD;  Location: [facility];  Service: Urology;  Laterality: N/A;   CYTSO/  DILATATION URETHRAL STRICTURE/  BX PROSTATIC URETHRA/  REMOVAL FOREIGN BODIES  06-25-2010   [facility]   ECTROPION REPAIR Left 06/18/2019   lower eyelid   INGUINAL HERNIA REPAIR Right 2000   MOHS SURGERY  01/ 2019    [facility]   left lower leg for [facility]   MOHS SURGERY  12/25/2018   for [facility] of left lower eyelid;  the second stage Hughs flap release 01-09-2019   RADICAL RESECTION OF SARCOMA TUMOR   03-15-2017   [facility]   RIGHT LOWER LEG , CALF AREA   RADIOACTIVE SEED IMPLANTS, PROSTATE  JAN  2003   SKIN LESION EXCISION  07/2012   [facility]ST  right shoulder   TONSILLECTOMY Left 11-10-2011  @[facility]    tonsillar cancer   TRANSURETHRAL INCISION OF BLADDER NECK N/A 8/Philip/2021   Procedure: TRANSURETHRAL INCISION OF BLADDER NECK;  Surgeon: Bjorn Pippin, MD;  Location: [facility];  Service: Urology;  Laterality: N/A;    Allergies  Allergen Reactions   Cefixime Rash   Contrast Media [Iodinated Contrast Media] Other (See Comments)    Avoid due to renal disease   Macrobid [Nitrofurantoin Macrocrystal] Swelling   Nsaids Other  (See Comments)    Has been told no NSAIDs, Ibuprofen Renal insufficiency hx.    Cephalosporins Rash    Outpatient Encounter Medications as of 09/26/2022  Medication Sig   buPROPion (WELLBUTRIN) 100 MG tablet Take 1 tablet by mouth twice daily   Cholecalciferol (VITAMIN D) 50 MCG (2000 UT) CAPS Take by mouth every other day.   COVID-19 mRNA vaccine 2023-2024 (COMIRNATY) syringe Inject 0.5 mLs into the muscle.   Cranberry-Vitamin C (AZO CRANBERRY URINARY TRACT PO) Take by mouth.   cyanocobalamin (VITAMIN B12) 1000 MCG/ML injection INJECT 1 ML INTRAMUSCULARLY  FOR 14 DAYS   Garlic (GARLIQUE PO) Take by mouth every other day.   ketoconazole (NIZORAL) 2 % cream Apply 1 Application topically daily.   lisinopril (ZESTRIL) 10 MG tablet Take 10 mg by mouth daily as needed.   NP THYROID 120 MG tablet Take 120 mg by mouth. 6 days a week   omeprazole (PRILOSEC) 40 MG capsule Take 1 capsule (40 mg total) by mouth daily.   RESVERATROL PO Take 100 mg by mouth every other day.   SYRINGE-NEEDLE, DISP, 3 ML (B-D 3CC LUER-LOK SYR 25GX5/8") 25G X 5/8" 3 ML MISC USE FOR INJECTION EVERY 14 DAYS   TURMERIC CURCUMIN PO Take 2 tablets by mouth daily.    UNABLE TO FIND [place] take once daily   UNABLE TO FIND [place] take 4 tablets once daily   UNABLE TO FIND Comprehensive immune support take 2 tablets once daily   UNABLE TO FIND Modified citrus pectin take 4 tablets once daily   UNABLE TO FIND Celery seed take 2 tablets by mouth once daily   UNABLE TO FIND Maxivision multiple vitamin for eyes   No facility-administered encounter medications on file as of 09/26/2022.    Review of Systems:  Review of Systems  Constitutional:  Positive for fatigue. Negative for activity change, appetite change, chills, diaphoresis, fever and unexpected weight change.  Respiratory:  Negative for cough, shortness of breath, wheezing and stridor.   Cardiovascular:  Positive for leg swelling. Negative for chest pain and  palpitations.  Gastrointestinal:  Negative for abdominal distention, abdominal pain, constipation and diarrhea.  Genitourinary:  Negative for difficulty  urinating and dysuria.  Musculoskeletal:  Negative for arthralgias, back pain, gait problem, joint swelling and myalgias.  Neurological:  Negative for dizziness, seizures, syncope, facial asymmetry, speech difficulty, weakness and headaches.  Hematological:  Negative for adenopathy. Does not bruise/bleed easily.  Psychiatric/Behavioral:  Positive for dysphoric mood and sleep disturbance. Negative for agitation, behavioral problems and confusion.     Health Maintenance  Topic Date Due   Medicare Annual Wellness (AWV)  11/13/2020   COVID-19 Vaccine (7 - 2023-24 season) 09/18/2022   Pneumonia Vaccine 75+ Years old  Completed   HPV VACCINES  Aged Out   DTaP/Tdap/Td  Discontinued   INFLUENZA VACCINE  Discontinued   Zoster Vaccines- Shingrix  Discontinued    Physical Exam: There were no vitals filed for this visit. There is no height or weight on file to calculate BMI. Physical Exam Vitals and nursing note reviewed.  Constitutional:      General: He is not in acute distress.    Appearance: He is not diaphoretic.  HENT:     Head: Normocephalic and atraumatic.  Neck:     Thyroid: No thyromegaly.     Vascular: No JVD.     Trachea: No tracheal deviation.  Cardiovascular:     Rate and Rhythm: Normal rate and regular rhythm.     Heart sounds: No murmur heard. Pulmonary:     Effort: Pulmonary effort is normal. No respiratory distress.     Breath sounds: Normal breath sounds. No wheezing.  Abdominal:     General: Bowel sounds are normal. There is no distension.     Palpations: Abdomen is soft.     Tenderness: There is no abdominal tenderness.  Lymphadenopathy:     Cervical: No cervical adenopathy.  Skin:    General: Skin is warm and dry.  Neurological:     Mental Status: He is alert and oriented to person, place, and time.      Cranial Nerves: No cranial nerve deficit.     Labs reviewed: Basic Metabolic Panel: Recent Labs    11/04/21 0000 03/11/22 0000 04/Philip/24 1502  NA 139 137 137  K 4.9 4.7 5.2  CL 101 101 101  CO2 Philip* 25* 29  GLUCOSE  --   --  106*  BUN Philip* 30* 38*  CREATININE 1.5* 1.5* 1.72*  CALCIUM 9.2  9.2 9.0 8.3*   Liver Function Tests: Recent Labs    11/04/21 0000 03/11/22 0000  AST 17  --   ALT 14  --   ALKPHOS 59  --   ALBUMIN 3.8  3.8 4.2   No results for input(s): "LIPASE", "AMYLASE" in the last 8760 hours. No results for input(s): "AMMONIA" in the last 8760 hours. CBC: Recent Labs    11/04/21 0000 03/11/22 0000 04/Philip/24 1502  WBC 4.9 4.7 7.4  NEUTROABS  --  2.60 5,321  HGB 13.5 13.8 11.8*  HCT 40* 40* 34.7*  MCV  --   --  93.3  PLT 195 200 280   Lipid Panel: No results for input(s): "CHOL", "HDL", "LDLCALC", "TRIG", "CHOLHDL", "LDLDIRECT" in the last 8760 hours. No results found for: "HGBA1C"  Procedures since last visit: No results found.  Assessment/Plan  1. Primary insomnia Continues to have sleep issues and daytime fatigue Check labs Has tried and failed many agents Start low dose restoril RTC 4 weeks  2. Essential hypertension Controlled Continue lisinopril   3. Fatigue Will check CBC CMP and Cortisol TSH was done in March at endocrinology and WNL Historically  we have not been able to pin point a reason for his fatigue other than mood  and sleep apnea  4. Sleep apnea Followed by neurology Compliant with cpap machine.    Labs/tests ordered:  * No order type specified * CBC CMP Lipid Panel Cortisol  Next appt:  4 weeks    Total time  time greater than 50% of total time spent doing pt counseling and coordination of care

## 2022-09-26 NOTE — Patient Instructions (Signed)
Try Restoril for sleep 1 capsule each night, if not improving may go up to 2 capsules

## 2022-09-29 DIAGNOSIS — E782 Mixed hyperlipidemia: Secondary | ICD-10-CM | POA: Diagnosis not present

## 2022-09-29 LAB — HEPATIC FUNCTION PANEL
ALT: 12 U/L (ref 10–40)
AST: 15 (ref 14–40)
Alkaline Phosphatase: 76 (ref 25–125)
Bilirubin, Total: 0.3

## 2022-09-29 LAB — LIPID PANEL
Cholesterol: 231 — AB (ref 0–200)
HDL: 62 (ref 35–70)
LDL Cholesterol: 152
Triglycerides: 84 (ref 40–160)

## 2022-09-29 LAB — CBC AND DIFFERENTIAL
HCT: 39 — AB (ref 41–53)
Hemoglobin: 13.2 — AB (ref 13.5–17.5)
Platelets: 221 10*3/uL (ref 150–400)
WBC: 5.5

## 2022-09-29 LAB — CBC: RBC: 4.12 (ref 3.87–5.11)

## 2022-09-29 LAB — BASIC METABOLIC PANEL
BUN: 28 — AB (ref 4–21)
CO2: 28 — AB (ref 13–22)
Chloride: 103 (ref 99–108)
Creatinine: 1.5 — AB (ref 0.6–1.3)
Glucose: 90
Potassium: 4.6 meq/L (ref 3.5–5.1)
Sodium: 141 (ref 137–147)

## 2022-09-29 LAB — COMPREHENSIVE METABOLIC PANEL
Albumin: 3.9 (ref 3.5–5.0)
Calcium: 9 (ref 8.7–10.7)
eGFR: 34

## 2022-10-03 ENCOUNTER — Telehealth: Payer: Medicare Other | Admitting: Adult Health

## 2022-10-03 ENCOUNTER — Encounter: Payer: Self-pay | Admitting: Adult Health

## 2022-10-03 NOTE — Telephone Encounter (Signed)
Could we please call Mr. Philip [name] and let him know that so far his labs (CBC and CMP) do not indicate a reason for his fatigue. He also had a cortisol drawn but this lab can take up to 10 business days to result.  Could we also check and see how the restoril is helping him with sleep?  Thank you  Peggye Ley NP

## 2022-10-06 NOTE — Telephone Encounter (Signed)
Patient returned call and labs were discussed. Patient states that the medication does not make a difference and he is still having issues sleeping

## 2022-10-07 DIAGNOSIS — E039 Hypothyroidism, unspecified: Secondary | ICD-10-CM | POA: Diagnosis not present

## 2022-10-07 MED ORDER — TEMAZEPAM 7.5 MG PO CAPS: 15.0000 mg | ORAL_CAPSULE | Freq: Every evening | ORAL | Status: DC | PRN

## 2022-10-07 NOTE — Addendum Note (Signed)
Addended by: Esmond Camper on: 10/07/2022 08:22 AM   Modules accepted: Orders

## 2022-10-07 NOTE — Telephone Encounter (Signed)
I would let him know he can try two capsules of Restoril each night and see if that helps.

## 2022-10-07 NOTE — Telephone Encounter (Signed)
Attempted to call patient about medication changes

## 2022-10-19 ENCOUNTER — Other Ambulatory Visit (HOSPITAL_BASED_OUTPATIENT_CLINIC_OR_DEPARTMENT_OTHER): Payer: Self-pay

## 2022-10-19 DIAGNOSIS — Z23 Encounter for immunization: Secondary | ICD-10-CM | POA: Diagnosis not present

## 2022-10-19 MED ORDER — COVID-19 MRNA VAC-TRIS(PFIZER) 30 MCG/0.3ML IM SUSY
0.3000 mL | PREFILLED_SYRINGE | Freq: Once | INTRAMUSCULAR | 0 refills | Status: AC
  Filled 2022-10-19: qty 0.3, 1d supply, fill #0

## 2022-10-21 ENCOUNTER — Other Ambulatory Visit: Payer: Self-pay | Admitting: Internal Medicine

## 2022-10-25 ENCOUNTER — Non-Acute Institutional Stay: Payer: Medicare Other | Admitting: Internal Medicine

## 2022-10-25 ENCOUNTER — Encounter: Payer: Self-pay | Admitting: Internal Medicine

## 2022-10-25 VITALS — BP 126/84 | HR 63 | Temp 98.0°F | Ht 71.0 in | Wt 158.0 lb

## 2022-10-25 DIAGNOSIS — G4733 Obstructive sleep apnea (adult) (pediatric): Secondary | ICD-10-CM | POA: Diagnosis not present

## 2022-10-25 DIAGNOSIS — F5101 Primary insomnia: Secondary | ICD-10-CM | POA: Diagnosis not present

## 2022-10-25 DIAGNOSIS — I10 Essential (primary) hypertension: Secondary | ICD-10-CM | POA: Diagnosis not present

## 2022-10-25 DIAGNOSIS — R42 Dizziness and giddiness: Secondary | ICD-10-CM | POA: Diagnosis not present

## 2022-10-25 DIAGNOSIS — F3341 Major depressive disorder, recurrent, in partial remission: Secondary | ICD-10-CM

## 2022-10-25 DIAGNOSIS — E782 Mixed hyperlipidemia: Secondary | ICD-10-CM

## 2022-10-25 DIAGNOSIS — E538 Deficiency of other specified B group vitamins: Secondary | ICD-10-CM | POA: Diagnosis not present

## 2022-10-25 MED ORDER — TEMAZEPAM 7.5 MG PO CAPS
7.5000 mg | ORAL_CAPSULE | Freq: Every day | ORAL | 0 refills | Status: DC
Start: 2022-10-25 — End: 2022-12-26

## 2022-10-25 NOTE — Progress Notes (Signed)
Location: [facility] features editor of Service:  Clinic (12)  Provider:   Code Status:  Goals of Care:     10/25/2022   11:06 AM  Advanced Directives  Does Patient Have a Medical Advance Directive? Yes  Type of Estate agent of [place];Living will;Out of facility DNR (pink MOST or yellow form)  Does patient want to make changes to medical advance directive? No - Patient declined  Copy of Healthcare Power of Attorney in Chart? Yes - validated most recent copy scanned in chart (See row information)     Chief Complaint  Patient presents with   Medical Management of Chronic Issues    Medical Management of Chronic Issues. 4 week follow up   Quality Metric Gaps    To discuss need for AWV    HPI: Patient is a 89 y.o. male seen today for an acute visit for Insomnia, Depression , Low energy and Anxiety Lives in [place]  Continues to have issue with Insomnia and Depression Saw Christy who started him on Restoril  But it seems he never started it He doesn't remember the conversation All his labs done have been normal again He finally did agree that he is having Anxiety and Depression which is leading to his Apathy  He also had to cancel his [place] Cruise   Dizziness Mostly when he gets up in the morning then gets better Weight is stable No Epigastric pain anymore Weight is stable now Though initially had lost some  Also Has Past medical  history of    sarcoma of Right lower extremity Follows closely with oncology at [facility] for CT scan of his chest for lung nodules. Has been in remission. Recent Imaging has not shown any issues   History of prostate cancer. History of tonsillar cancer Macular degeneration follows with ophthalmologist   Aortitis syndrome.  Follows with Dr. Kathi Ludwig who sometimes has to use prednisone as needed   B12 deficiency takes injection self administer   Hypothyroid follows with Dr. Talmage Nap    CKD follows with  Renal  Sleep Apnea CPAP   Past Medical History:  Diagnosis Date   Age-related macular degeneration, dry, both eyes    Aortitis syndrome (HCC) rheumotologist-  dr syed   IgG4 syndrome--  (effects abdomine) ,  previously long term use prednisone last taken 07/ 2021   Basal cell carcinoma (BCC) of eye    CKD (chronic kidney disease), stage III [facility])    nephrologist-- dr Bea Laura. Signe Colt   Complication of anesthesia    post op acute urinary retention   DDD (degenerative disc disease)    Fatigue    pt evalulation by cardiology--- dr Mackey Birchwood, note 05-21-2019 in epic,(non cardiac)  normal echo and event monitor showed extra beats of atrial tachycardia , no atrial fib, and rare ecotpy   Full dentures    GERD (gastroesophageal reflux disease)    History of basal cell carcinoma (BCC) excision    12-25-2018 MOH's reconstruction left lower eyelid   History of external beam radiation therapy 2002   prostate cancer  and boost with radioative prostate seed implants   History of prostate cancer DX  2002   S/P EXTERNAL RADIATION/ RADIOACTIVE SEED IMPLANTS  2003   History of squamous cell carcinoma excision    2012--  RIGHT LOWER EXTREM;  2019 LEFT LOWER EXTREMITIY   HTN (hypertension)    followed by pcp   Hyperlipidemia    Hypothyroidism    followed  by pcp--- acquired atrophy due to radiation;  previously seen by endocrinologist-  dr balan--- per pt takes his own thyroid med. called NP Thyroid 90 mg daily, does not take synthroid   Large vessel vasculitis (HCC)    rheumotologist-  dr syed --- IgG4 related aoritis involving renal arteries ,  pt treated with predisone.  (09-06-2019  per pt no longer prednisone last taken one month ago 07/ 2021, weaned off by pcp per pt request)   Left renal artery stenosis [facility])    nephrologist--- dr Signe Colt--- with aneurysm proximal    Lymphedema of left leg    followed by [facility]---- pt stated uses compression hose   OSA (obstructive sleep apnea)  09-06-2019  per pt lasted used cpap approx. 2019, stated felt he no longer needed    pt retested 11-13-2016 at [facility] w/ dr ather-- moderate to severe OSA , AHI 16.4/h/  03-24-2017    Pulmonary nodule followed by [facility]   per CT 06-11-2019  in Care Everywhere in epic done at [facility],  LUL nodule , not mets   Renal cyst, right    Restless legs syndrome (RLS)    S/P radiation therapy     for tonsillar cancer (head and neck) completed 01-26-2012 at [facility]);   and XRT for right lower leg sarcoma --- completed 06-06-2017   Saliva decreased    Sarcoma of lower extremity, right (HCC) oncolgoist-  dr Genella Mech ([facility]) & Dr Leane Platt ([facility])   dx 02-14-2017 w/ needle core bx;  high grade pleomorphic spindle cell sarcoma , grade 2 (cT2N0M0); 03-15-2017  radical resection sarcoma tumor right lower leg  and completed radiation 06-06-2017   Self-catheterizes urinary bladder    QID   AND   PRN   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+) (Stage IVA)---- dx oct 2013  ----s/p left tonsillectomy and concurent chemo and radiation/  ended 01-26-2012-- no surgical intervention---  residuals ( dry mouth, decreased saliva)   oncologist at [facility]--  dr brizel--  HX OF -- NO RECURRENCE   Urethral false passage 07/2019   Urethral stricture urologist--- dr Annabell Howells   chronic---  post urethral dilation's   Urinary retention with incomplete bladder emptying    Vitamin B 12 deficiency 01/28/2011   Vitamin D deficiency     Past Surgical History:  Procedure Laterality Date   BALLOON DILATION N/A 11/10/2014   Procedure: CYSTO BALLOON DILATION AND RETROGRADE URETHROGRAM ;  Surgeon: Alfredo Martinez, MD;  Location: [facility];  Service: Urology;  Laterality: N/A;   CATARACT EXTRACTION W/ INTRAOCULAR LENS  IMPLANT, BILATERAL  2012  approx   CYSTO/ BALLOON DILATION OF  URETHRAL STRICTURE  12-25-2010   CYSTOSCOPY WITH  RETROGRADE URETHROGRAM N/A 03/30/2016   Procedure: CYSTOSCOPY WITH RETROGRADE URETHROGRAM AND BALLOON DILATION with cystogram;  Surgeon: Alfredo Martinez, MD;  Location: [facility];  Service: Urology;  Laterality: N/A;   CYSTOSCOPY WITH URETHRAL DILATATION  05/31/2011   Procedure: CYSTOSCOPY WITH URETHRAL DILATATION;  Surgeon: Martina Sinner, MD;  Location: [facility];  Service: Urology;  Laterality: N/A;  BALLOON DILATION   CYSTOSCOPY WITH URETHRAL DILATATION N/A 09/13/2016   Procedure: CYSTOSCOPY WITH URETHRAL BALLOON DILATATION;  Surgeon: Alfredo Martinez, MD;  Location: [facility];  Service: Urology;  Laterality: N/A;   CYSTOSCOPY WITH URETHRAL DILATATION N/A 03/30/2017   Procedure: CYSTOSCOPY WITH BALLOON URETHRAL DILATATION;  Surgeon:  Alfredo Martinez, MD;  Location: [facility];  Service: Urology;  Laterality: N/A;   CYSTOSCOPY WITH URETHRAL DILATATION N/A 09/12/2019   Procedure: CYSTOSCOPY WITH URETHRAL DILATATION;  Surgeon: Bjorn Pippin, MD;  Location: [facility];  Service: Urology;  Laterality: N/A;   CYSTOSCOPY/RETROGRADE/URETEROSCOPY N/A 05/22/2012   Procedure: CYSTOSCOPY BALLOON DILATION RETROGRADE URETEROGRAM ;  Surgeon: Martina Sinner, MD;  Location: [facility];  Service: Urology;  Laterality: N/A;   CYTSO/  DILATATION URETHRAL STRICTURE/  BX PROSTATIC URETHRA/  REMOVAL FOREIGN BODIES  06-25-2010   [facility]   ECTROPION REPAIR Left 06/18/2019   lower eyelid   INGUINAL HERNIA REPAIR Right 2000   MOHS SURGERY  01/ 2019    [facility]   left lower leg for [facility]   MOHS SURGERY  12/25/2018   for [facility] of left lower eyelid;  the second stage Hughs flap release 01-09-2019   RADICAL RESECTION OF SARCOMA TUMOR   03-15-2017   [facility]   RIGHT LOWER LEG , CALF AREA   RADIOACTIVE SEED IMPLANTS, PROSTATE  JAN  2003   SKIN LESION EXCISION  07/2012   [facility]  right shoulder   TONSILLECTOMY Left 11-10-2011  @[facility]     tonsillar cancer   TRANSURETHRAL INCISION OF BLADDER NECK N/A 09/12/2019   Procedure: TRANSURETHRAL INCISION OF BLADDER NECK;  Surgeon: Bjorn Pippin, MD;  Location: [facility];  Service: Urology;  Laterality: N/A;    Allergies  Allergen Reactions   Cefixime Rash   Contrast Media [Iodinated Contrast Media] Other (See Comments)    Avoid due to renal disease   Macrobid [Nitrofurantoin Macrocrystal] Swelling   Nsaids Other (See Comments)    Has been told no NSAIDs, Ibuprofen Renal insufficiency hx.    Cephalosporins Rash    Outpatient Encounter Medications as of 10/25/2022  Medication Sig   buPROPion (WELLBUTRIN) 100 MG tablet Take 1 tablet by mouth twice daily   Cholecalciferol (VITAMIN D) 50 MCG (2000 UT) CAPS Take by mouth every other day.   cyanocobalamin (VITAMIN B12) 1000 MCG/ML injection INJECT 1 ML INTRAMUSCULARLY  FOR 14 DAYS   Garlic (GARLIQUE PO) Take by mouth every other day.   ketoconazole (NIZORAL) 2 % cream Apply 1 Application topically daily.   lisinopril (ZESTRIL) 10 MG tablet Take 1 tablet by mouth once daily   NP THYROID 120 MG tablet Take 120 mg by mouth. 6 days a week   RESVERATROL PO Take 100 mg by mouth every other day.   SYRINGE-NEEDLE, DISP, 3 ML (B-D 3CC LUER-LOK SYR 25GX5/8") 25G X 5/8" 3 ML MISC USE FOR INJECTION EVERY 14 DAYS   temazepam (RESTORIL) 7.5 MG capsule Take 1 capsule (7.5 mg total) by mouth at bedtime.   TURMERIC CURCUMIN PO Take 2 tablets by mouth daily.    UNABLE TO FIND [place] take once daily   UNABLE TO FIND [place] take 4 tablets once daily   UNABLE TO FIND Comprehensive immune support take 2 tablets once daily   UNABLE TO FIND Modified citrus pectin take 4 tablets once daily   UNABLE TO FIND Celery seed take 2 tablets by mouth once daily   UNABLE TO FIND Maxivision multiple vitamin for eyes   [DISCONTINUED] COVID-19 mRNA vaccine 2023-2024 (COMIRNATY) syringe Inject 0.5 mLs into the muscle.   [DISCONTINUED]  Cranberry-Vitamin C (AZO CRANBERRY URINARY TRACT PO) Take by mouth.   [DISCONTINUED] temazepam (RESTORIL) 7.5 MG capsule Take 2 capsules (15 mg total) by mouth at bedtime as needed  for sleep. Ok to take 1 cap at night for sleep, If not working can increased to 2 caps each night.   No facility-administered encounter medications on file as of 10/25/2022.    Review of Systems:  Review of Systems  Constitutional:  Negative for activity change, appetite change and unexpected weight change.  HENT: Negative.    Respiratory:  Negative for cough and shortness of breath.   Cardiovascular:  Positive for leg swelling.  Gastrointestinal:  Negative for constipation.  Genitourinary:  Negative for frequency.  Musculoskeletal:  Negative for arthralgias, gait problem and myalgias.  Skin: Negative.  Negative for rash.  Neurological:  Positive for dizziness. Negative for weakness.  Psychiatric/Behavioral:  Positive for dysphoric mood and sleep disturbance. Negative for confusion.   All other systems reviewed and are negative.   Health Maintenance  Topic Date Due   Medicare Annual Wellness (AWV)  11/13/2020   COVID-19 Vaccine (8 - 2023-24 season) 12/14/2022   Pneumonia Vaccine 38+ Years old  Completed   HPV VACCINES  Aged Out   DTaP/Tdap/Td  Discontinued   INFLUENZA VACCINE  Discontinued   Zoster Vaccines- Shingrix  Discontinued    Physical Exam: Vitals:   10/25/22 1100  BP: 126/84  Pulse: 63  Temp: 98 F (36.7 C)  SpO2: 99%  Weight: 158 lb (71.7 kg)  Height: 5\' 11"  (1.803 m)   Body mass index is 22.04 kg/m. Physical Exam Vitals reviewed.  Constitutional:      Appearance: Normal appearance.  HENT:     Head: Normocephalic.     Nose: Nose normal.     Mouth/Throat:     Mouth: Mucous membranes are moist.     Pharynx: Oropharynx is clear.  Eyes:     Pupils: Pupils are equal, round, and reactive to light.  Cardiovascular:     Rate and Rhythm: Normal rate and regular rhythm.     Pulses:  Normal pulses.     Heart sounds: No murmur heard. Pulmonary:     Effort: Pulmonary effort is normal. No respiratory distress.     Breath sounds: Normal breath sounds. No rales.  Abdominal:     General: Abdomen is flat. Bowel sounds are normal.     Palpations: Abdomen is soft.  Musculoskeletal:     Cervical back: Neck supple.     Comments: Left Leg Swelling more then right  Skin:    General: Skin is warm.  Neurological:     General: No focal deficit present.     Mental Status: He is alert and oriented to person, place, and time.  Psychiatric:        Mood and Affect: Mood normal.        Thought Content: Thought content normal.     Labs reviewed: Basic Metabolic Panel: Recent Labs    03/11/22 0000 05/13/22 1502 09/29/22 0000  NA 137 137 141  K 4.7 5.2 4.6  CL 101 101 103  CO2 25* 29 28*  GLUCOSE  --  106*  --   BUN 30* 38* 28*  CREATININE 1.5* 1.72* 1.5*  CALCIUM 9.0 8.3* 9.0   Liver Function Tests: Recent Labs    11/04/21 0000 03/11/22 0000 09/29/22 0000  AST 17  --  15  ALT 14  --  12  ALKPHOS 59  --  76  ALBUMIN 3.8  3.8 4.2 3.9   No results for input(s): "LIPASE", "AMYLASE" in the last 8760 hours. No results for input(s): "AMMONIA" in the last 8760 hours. CBC:  Recent Labs    03/11/22 0000 05/13/22 1502 09/29/22 0000  WBC 4.7 7.4 5.5  NEUTROABS 2.60 5,321  --   HGB 13.8 11.8* 13.2*  HCT 40* 34.7* 39*  MCV  --  93.3  --   PLT 200 280 221   Lipid Panel: Recent Labs    09/29/22 0000  CHOL 231*  HDL 62  LDLCALC 152  TRIG 84   No results found for: "HGBA1C"  Procedures since last visit: No results found.  Assessment/Plan 1. Primary insomnia Reordered Restoril  He is willing to try it - Ambulatory referral to Psychiatry  2. Recurrent major depressive disorder, in partial remission (HCC) On Wellbutrin - Ambulatory referral to Psychiatry  3. Essential hypertension Lisinopril   4. OSA (obstructive sleep apnea) Compliant with  CPAP  5. Mixed hyperlipidemia Refuses statin  6. B12 deficiency Self Injects  7. Dizziness In the morning Gets better later 8 CKD Sees Nephrology   Labs/tests ordered:  * No order type specified * Next appt:  11/22/2022

## 2022-10-26 ENCOUNTER — Telehealth: Payer: Self-pay

## 2022-10-26 NOTE — Telephone Encounter (Signed)
Caryl Bis  You15 minutes ago (10:26 AM)   EL Spoke to [place] and advised her that the referral and records were faxed over this morning.  Alcario Drought

## 2022-10-26 NOTE — Telephone Encounter (Addendum)
Independent living Nurse with [facility] called stating patient was referred to Dr.Plovsky and they need office notes

## 2022-10-31 ENCOUNTER — Encounter: Payer: Self-pay | Admitting: Adult Health

## 2022-11-10 DIAGNOSIS — D3132 Benign neoplasm of left choroid: Secondary | ICD-10-CM | POA: Diagnosis not present

## 2022-11-10 DIAGNOSIS — H43813 Vitreous degeneration, bilateral: Secondary | ICD-10-CM | POA: Diagnosis not present

## 2022-11-10 DIAGNOSIS — H353114 Nonexudative age-related macular degeneration, right eye, advanced atrophic with subfoveal involvement: Secondary | ICD-10-CM | POA: Diagnosis not present

## 2022-11-10 DIAGNOSIS — H353123 Nonexudative age-related macular degeneration, left eye, advanced atrophic without subfoveal involvement: Secondary | ICD-10-CM | POA: Diagnosis not present

## 2022-11-16 DIAGNOSIS — E039 Hypothyroidism, unspecified: Secondary | ICD-10-CM | POA: Diagnosis not present

## 2022-11-22 ENCOUNTER — Non-Acute Institutional Stay: Payer: Medicare Other | Admitting: Internal Medicine

## 2022-11-22 ENCOUNTER — Encounter: Payer: Self-pay | Admitting: Internal Medicine

## 2022-11-22 VITALS — BP 144/82 | HR 80 | Temp 98.2°F | Resp 16 | Ht 71.0 in | Wt 159.2 lb

## 2022-11-22 DIAGNOSIS — R42 Dizziness and giddiness: Secondary | ICD-10-CM

## 2022-11-22 DIAGNOSIS — F5101 Primary insomnia: Secondary | ICD-10-CM

## 2022-11-22 DIAGNOSIS — F3341 Major depressive disorder, recurrent, in partial remission: Secondary | ICD-10-CM

## 2022-11-22 DIAGNOSIS — I10 Essential (primary) hypertension: Secondary | ICD-10-CM | POA: Diagnosis not present

## 2022-11-22 NOTE — Progress Notes (Signed)
Location: [facility] features editor of Service:  Clinic (12)  Provider:   Code Status:  Goals of Care:     11/22/2022    9:54 AM  Advanced Directives  Does Patient Have a Medical Advance Directive? Yes  Type of Estate agent of [place];Living will;Out of facility DNR (pink MOST or yellow form)  Does patient want to make changes to medical advance directive? No - Patient declined  Copy of Healthcare Power of Attorney in Chart? No - copy requested  Pre-existing out of facility DNR order (yellow form or pink MOST form) Pink MOST/Yellow Form most recent copy in chart - Physician notified to receive inpatient order     Chief Complaint  Patient presents with   Medical Management of Chronic Issues    Patient is being seen for a 4 week follow up . Patient states he is still having some trouble sleeping, waking up unsteady on his feet     HPI: Patient is a 89 y.o. male seen today for an acute visit for Follow up of his Insomnia and Depression  Lives in [place]  Seen for Depression and Insomnia He thinks Restoril is helping but he has not taken it on regular Basis yet But it helps when he takes it No Side effects that he has noticed He  still waiting for Appointment with Dr Donell Beers the Psych office Continues to complain of Fatigue and feeling tired  Weight is stable No Fall Does feel Dizzy in the morning  Also Has Past medical  history of    sarcoma of Right lower extremity Follows closely with oncology at [facility] for CT scan of his chest for lung nodules. Has been in remission. Recent Imaging has not shown any issues   History of prostate cancer. History of tonsillar cancer Macular degeneration follows with ophthalmologist   Aortitis syndrome.  Follows with Dr. Kathi Ludwig who sometimes has to use prednisone as needed   B12 deficiency takes injection self administer   Hypothyroid follows with Dr. Talmage Nap    CKD follows with Renal   Sleep  Apnea CPAP      Past Medical History:  Diagnosis Date   Age-related macular degeneration, dry, both eyes    Aortitis syndrome (HCC) rheumotologist-  dr syed   IgG4 syndrome--  (effects abdomine) ,  previously long term use prednisone last taken 07/ 2021   Basal cell carcinoma (BCC) of eye    CKD (chronic kidney disease), stage III [facility])    nephrologist-- dr Bea Laura. Signe Colt   Complication of anesthesia    post op acute urinary retention   DDD (degenerative disc disease)    Fatigue    pt evalulation by cardiology--- dr Mackey Birchwood, note 05-21-2019 in epic,(non cardiac)  normal echo and event monitor showed extra beats of atrial tachycardia , no atrial fib, and rare ecotpy   Full dentures    GERD (gastroesophageal reflux disease)    History of basal cell carcinoma (BCC) excision    12-25-2018 MOH's reconstruction left lower eyelid   History of external beam radiation therapy 2002   prostate cancer  and boost with radioative prostate seed implants   History of prostate cancer DX  2002   S/P EXTERNAL RADIATION/ RADIOACTIVE SEED IMPLANTS  2003   History of squamous cell carcinoma excision    2012--  RIGHT LOWER EXTREM;  2019 LEFT LOWER EXTREMITIY   HTN (hypertension)    followed by pcp   Hyperlipidemia  Hypothyroidism    followed by pcp--- acquired atrophy due to radiation;  previously seen by endocrinologist-  dr balan--- per pt takes his own thyroid med. called NP Thyroid 90 mg daily, does not take synthroid   Large vessel vasculitis (HCC)    rheumotologist-  dr syed --- IgG4 related aoritis involving renal arteries ,  pt treated with predisone.  (09-06-2019  per pt no longer prednisone last taken one month ago 07/ 2021, weaned off by pcp per pt request)   Left renal artery stenosis [facility])    nephrologist--- dr Signe Colt--- with aneurysm proximal    Lymphedema of left leg    followed by [facility]---- pt stated uses compression hose   OSA (obstructive sleep apnea) 09-06-2019  per  pt lasted used cpap approx. 2019, stated felt he no longer needed    pt retested 11-13-2016 at [facility] w/ dr ather-- moderate to severe OSA , AHI 16.4/h/  03-24-2017    Pulmonary nodule followed by [facility]   per CT 06-11-2019  in Care Everywhere in epic done at [facility],  LUL nodule , not mets   Renal cyst, right    Restless legs syndrome (RLS)    S/P radiation therapy     for tonsillar cancer (head and neck) completed 01-26-2012 at [facility]);   and XRT for right lower leg sarcoma --- completed 06-06-2017   Saliva decreased    Sarcoma of lower extremity, right (HCC) oncolgoist-  dr Genella Mech ([facility]) & Dr Leane Platt ([facility])   dx 02-14-2017 w/ needle core bx;  high grade pleomorphic spindle cell sarcoma , grade 2 (cT2N0M0); 03-15-2017  radical resection sarcoma tumor right lower leg  and completed radiation 06-06-2017   Self-catheterizes urinary bladder    QID   AND   PRN   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+) (Stage IVA)---- dx oct 2013  ----s/p left tonsillectomy and concurent chemo and radiation/  ended 01-26-2012-- no surgical intervention---  residuals ( dry mouth, decreased saliva)   oncologist at [facility]--  dr brizel--  HX OF -- NO RECURRENCE   Urethral false passage 07/2019   Urethral stricture urologist--- dr Annabell Howells   chronic---  post urethral dilation's   Urinary retention with incomplete bladder emptying    Vitamin B 12 deficiency 01/28/2011   Vitamin D deficiency     Past Surgical History:  Procedure Laterality Date   BALLOON DILATION N/A 11/10/2014   Procedure: CYSTO BALLOON DILATION AND RETROGRADE URETHROGRAM ;  Surgeon: Alfredo Martinez, MD;  Location: [facility];  Service: Urology;  Laterality: N/A;   CATARACT EXTRACTION W/ INTRAOCULAR LENS  IMPLANT, BILATERAL  2012  approx   CYSTO/ BALLOON DILATION OF  URETHRAL STRICTURE  12-25-2010   CYSTOSCOPY WITH RETROGRADE URETHROGRAM  N/A 03/30/2016   Procedure: CYSTOSCOPY WITH RETROGRADE URETHROGRAM AND BALLOON DILATION with cystogram;  Surgeon: Alfredo Martinez, MD;  Location: [facility];  Service: Urology;  Laterality: N/A;   CYSTOSCOPY WITH URETHRAL DILATATION  05/31/2011   Procedure: CYSTOSCOPY WITH URETHRAL DILATATION;  Surgeon: Martina Sinner, MD;  Location: [facility];  Service: Urology;  Laterality: N/A;  BALLOON DILATION   CYSTOSCOPY WITH URETHRAL DILATATION N/A 09/13/2016   Procedure: CYSTOSCOPY WITH URETHRAL BALLOON DILATATION;  Surgeon: Alfredo Martinez, MD;  Location: [facility];  Service: Urology;  Laterality: N/A;   CYSTOSCOPY WITH URETHRAL DILATATION N/A 03/30/2017   Procedure: CYSTOSCOPY WITH  BALLOON URETHRAL DILATATION;  Surgeon: Alfredo Martinez, MD;  Location: [facility];  Service: Urology;  Laterality: N/A;   CYSTOSCOPY WITH URETHRAL DILATATION N/A 09/12/2019   Procedure: CYSTOSCOPY WITH URETHRAL DILATATION;  Surgeon: Bjorn Pippin, MD;  Location: [facility];  Service: Urology;  Laterality: N/A;   CYSTOSCOPY/RETROGRADE/URETEROSCOPY N/A 05/22/2012   Procedure: CYSTOSCOPY BALLOON DILATION RETROGRADE URETEROGRAM ;  Surgeon: Martina Sinner, MD;  Location: [facility];  Service: Urology;  Laterality: N/A;   CYTSO/  DILATATION URETHRAL STRICTURE/  BX PROSTATIC URETHRA/  REMOVAL FOREIGN BODIES  06-25-2010   [facility]   ECTROPION REPAIR Left 06/18/2019   lower eyelid   INGUINAL HERNIA REPAIR Right 2000   MOHS SURGERY  01/ 2019    [facility]   left lower leg for [facility]   MOHS SURGERY  12/25/2018   for [facility] of left lower eyelid;  the second stage Hughs flap release 01-09-2019   RADICAL RESECTION OF SARCOMA TUMOR   03-15-2017   [facility]   RIGHT LOWER LEG , CALF AREA   RADIOACTIVE SEED IMPLANTS, PROSTATE  JAN  2003   SKIN LESION EXCISION  07/2012   [facility]  right shoulder   TONSILLECTOMY Left 11-10-2011  @[facility]    tonsillar cancer    TRANSURETHRAL INCISION OF BLADDER NECK N/A 09/12/2019   Procedure: TRANSURETHRAL INCISION OF BLADDER NECK;  Surgeon: Bjorn Pippin, MD;  Location: [facility];  Service: Urology;  Laterality: N/A;    Allergies  Allergen Reactions   Cefixime Rash   Contrast Media [Iodinated Contrast Media] Other (See Comments)    Avoid due to renal disease   Macrobid [Nitrofurantoin Macrocrystal] Swelling   Nsaids Other (See Comments)    Has been told no NSAIDs, Ibuprofen Renal insufficiency hx.    Cephalosporins Rash    Outpatient Encounter Medications as of 11/22/2022  Medication Sig   buPROPion (WELLBUTRIN) 100 MG tablet Take 1 tablet by mouth twice daily   Cholecalciferol (VITAMIN D) 50 MCG (2000 UT) CAPS Take by mouth every other day.   cyanocobalamin (VITAMIN B12) 1000 MCG/ML injection INJECT 1 ML INTRAMUSCULARLY  FOR 14 DAYS   Garlic (GARLIQUE PO) Take by mouth every other day.   ketoconazole (NIZORAL) 2 % cream Apply 1 Application topically daily.   lisinopril (ZESTRIL) 10 MG tablet Take 1 tablet by mouth once daily   NP THYROID 120 MG tablet Take 120 mg by mouth. 6 days a week   RESVERATROL PO Take 100 mg by mouth every other day.   sildenafil (VIAGRA) 100 MG tablet Take 100 mg by mouth as needed.   SYRINGE-NEEDLE, DISP, 3 ML (B-D 3CC LUER-LOK SYR 25GX5/8") 25G X 5/8" 3 ML MISC USE FOR INJECTION EVERY 14 DAYS   temazepam (RESTORIL) 7.5 MG capsule Take 1 capsule (7.5 mg total) by mouth at bedtime.   TURMERIC CURCUMIN PO Take 2 tablets by mouth daily.    UNABLE TO FIND [place] take once daily   UNABLE TO FIND [place] take 4 tablets once daily   UNABLE TO FIND Comprehensive immune support take 2 tablets once daily   UNABLE TO FIND Modified citrus pectin take 4 tablets once daily   UNABLE TO FIND Celery seed take 2 tablets by mouth once daily   UNABLE TO FIND Maxivision multiple vitamin for eyes   No facility-administered encounter medications on file as of 11/22/2022.     Review of Systems:  Review of Systems  Constitutional:  Negative for activity change, appetite  change and unexpected weight change.  HENT: Negative.    Respiratory:  Negative for cough and shortness of breath.   Cardiovascular:  Negative for leg swelling.  Gastrointestinal:  Negative for constipation.  Genitourinary:  Negative for frequency.  Musculoskeletal:  Negative for arthralgias, gait problem and myalgias.  Skin: Negative.  Negative for rash.  Neurological:  Positive for dizziness. Negative for weakness.  Psychiatric/Behavioral:  Positive for dysphoric mood and sleep disturbance. Negative for confusion.   All other systems reviewed and are negative.   Health Maintenance  Topic Date Due   Medicare Annual Wellness (AWV)  11/13/2020   COVID-19 Vaccine (8 - 2023-24 season) 12/14/2022   Pneumonia Vaccine 52+ Years old  Completed   HPV VACCINES  Aged Out   DTaP/Tdap/Td  Discontinued   INFLUENZA VACCINE  Discontinued   Zoster Vaccines- Shingrix  Discontinued    Physical Exam: Vitals:   11/22/22 0958  BP: (!) 144/82  Pulse: 80  Resp: 16  Temp: 98.2 F (36.8 C)  TempSrc: Temporal  SpO2: 96%  Weight: 159 lb 3.2 oz (72.2 kg)  Height: 5\' 11"  (1.803 m)   Body mass index is 22.2 kg/m. Physical Exam Vitals reviewed.  Constitutional:      Appearance: Normal appearance.  HENT:     Head: Normocephalic.     Nose: Nose normal.     Mouth/Throat:     Mouth: Mucous membranes are moist.     Pharynx: Oropharynx is clear.  Eyes:     Pupils: Pupils are equal, round, and reactive to light.  Cardiovascular:     Rate and Rhythm: Normal rate and regular rhythm.     Pulses: Normal pulses.     Heart sounds: No murmur heard. Pulmonary:     Effort: Pulmonary effort is normal. No respiratory distress.     Breath sounds: Normal breath sounds. No rales.  Abdominal:     General: Abdomen is flat. Bowel sounds are normal.     Palpations: Abdomen is soft.  Musculoskeletal:         General: Swelling present.     Cervical back: Neck supple.  Skin:    General: Skin is warm.  Neurological:     General: No focal deficit present.     Mental Status: He is alert and oriented to person, place, and time.  Psychiatric:        Mood and Affect: Mood normal.        Thought Content: Thought content normal.     Labs reviewed: Basic Metabolic Panel: Recent Labs    03/11/22 0000 05/13/22 1502 09/29/22 0000  NA 137 137 141  K 4.7 5.2 4.6  CL 101 101 103  CO2 25* 29 28*  GLUCOSE  --  106*  --   BUN 30* 38* 28*  CREATININE 1.5* 1.72* 1.5*  CALCIUM 9.0 8.3* 9.0   Liver Function Tests: Recent Labs    03/11/22 0000 09/29/22 0000  AST  --  15  ALT  --  12  ALKPHOS  --  76  ALBUMIN 4.2 3.9   No results for input(s): "LIPASE", "AMYLASE" in the last 8760 hours. No results for input(s): "AMMONIA" in the last 8760 hours. CBC: Recent Labs    03/11/22 0000 05/13/22 1502 09/29/22 0000  WBC 4.7 7.4 5.5  NEUTROABS 2.60 5,321  --   HGB 13.8 11.8* 13.2*  HCT 40* 34.7* 39*  MCV  --  93.3  --   PLT 200 280 221   Lipid  Panel: Recent Labs    09/29/22 0000  CHOL 231*  HDL 62  LDLCALC 152  TRIG 84   No results found for: "HGBA1C"  Procedures since last visit: No results found.  Assessment/Plan 1. Primary insomnia Restoril seems to be working He is doing it PRN which is fine  2. Recurrent major depressive disorder, in partial remission (HCC) On Wellbutrin Waiting for his appointment with Dr Donell Beers  3. Essential hypertension Takes Lisinopril depending his BP  4. Dizziness Feels it in the morning  OSA (obstructive sleep apnea) Compliant with CPAP    Mixed hyperlipidemia Refuses statin   B12 deficiency Self Injects  CKD Sees Nephrology  Labs/tests ordered:  * No order type specified * Next appt:  Visit date not found

## 2022-12-13 DIAGNOSIS — F330 Major depressive disorder, recurrent, mild: Secondary | ICD-10-CM | POA: Diagnosis not present

## 2022-12-22 DIAGNOSIS — H353123 Nonexudative age-related macular degeneration, left eye, advanced atrophic without subfoveal involvement: Secondary | ICD-10-CM | POA: Diagnosis not present

## 2022-12-22 DIAGNOSIS — H43813 Vitreous degeneration, bilateral: Secondary | ICD-10-CM | POA: Diagnosis not present

## 2022-12-22 DIAGNOSIS — H353114 Nonexudative age-related macular degeneration, right eye, advanced atrophic with subfoveal involvement: Secondary | ICD-10-CM | POA: Diagnosis not present

## 2022-12-22 DIAGNOSIS — D3132 Benign neoplasm of left choroid: Secondary | ICD-10-CM | POA: Diagnosis not present

## 2022-12-26 ENCOUNTER — Encounter: Payer: Self-pay | Admitting: Adult Health

## 2022-12-26 ENCOUNTER — Non-Acute Institutional Stay: Payer: Medicare Other | Admitting: Adult Health

## 2022-12-26 VITALS — BP 136/78 | HR 72 | Temp 97.8°F | Resp 16 | Ht 71.0 in | Wt 157.0 lb

## 2022-12-26 DIAGNOSIS — F331 Major depressive disorder, recurrent, moderate: Secondary | ICD-10-CM | POA: Diagnosis not present

## 2022-12-26 DIAGNOSIS — F3341 Major depressive disorder, recurrent, in partial remission: Secondary | ICD-10-CM | POA: Diagnosis not present

## 2022-12-26 DIAGNOSIS — E034 Atrophy of thyroid (acquired): Secondary | ICD-10-CM | POA: Diagnosis not present

## 2022-12-26 DIAGNOSIS — R21 Rash and other nonspecific skin eruption: Secondary | ICD-10-CM | POA: Diagnosis not present

## 2022-12-26 MED ORDER — TRIAMCINOLONE ACETONIDE 0.1 % EX CREA
1.0000 | TOPICAL_CREAM | Freq: Two times a day (BID) | CUTANEOUS | 0 refills | Status: AC
Start: 2022-12-26 — End: 2023-01-10

## 2022-12-26 MED ORDER — BUPROPION HCL 100 MG PO TABS: 100.0000 mg | ORAL_TABLET | Freq: Three times a day (TID) | ORAL | Status: DC

## 2022-12-26 MED ORDER — TEMAZEPAM 7.5 MG PO CAPS: 7.5000 mg | ORAL_CAPSULE | Freq: Every day | ORAL | Status: DC

## 2022-12-26 NOTE — Progress Notes (Signed)
Location: [facility]  POS: Clinic  Provider:  Peggye Ley, ANP [facility] [phone]   Code Status: DNR Goals of Care:     12/26/2022    1:38 PM  Advanced Directives  Does Patient Have a Medical Advance Directive? Yes  Type of Estate agent of [place];Living will;Out of facility DNR (pink MOST or yellow form)  Does patient want to make changes to medical advance directive? No - Patient declined  Copy of Healthcare Power of Attorney in Chart? No - copy requested  Pre-existing out of facility DNR order (yellow form or pink MOST form) Pink MOST/Yellow Form most recent copy in chart - Physician notified to receive inpatient order     Chief Complaint  Patient presents with   Medical Management of Chronic Issues    Patient is being seen for a 1 month follow up. Patient states his energy has been down. Alo wants to discuss balance   Immunizations    Patient is due for covid vaccine    Health Maintenance    Patient is due for AWV     HPI: Patient is a 89 y.o. male seen today fo  He has macular degeneration and is getting shots in both eyes. His vision is worsening and he has to use a magnifying glass. Left eye is the one he used to be able to read out of but its getting worse. He does not drive. His worsening physician is making him depressed. He feels the shot is making him feel more depressed.  He was referred to Dr Donell Beers and he saw him and he will follow up in January. Wellbutrin was increased to three times day.  Has PTSD and drift to talking about childhood issues.   Restoril was ordered each night. He is not sure how often he uses. He is not sure if it helps.  Using cpap for sleep apnea. Did not help with his day time fatigue.   Saw Dr Talmage Nap for hypothyroidism TSH E03.9 NP Thyroid increased to 120 mg repeat TSH ordered 12/11 Past Medical History:  Diagnosis Date   Age-related macular degeneration, dry, both  eyes    Aortitis syndrome (HCC) rheumotologist-  dr syed   IgG4 syndrome--  (effects abdomine) ,  previously long term use prednisone last taken 07/ 2021   Basal cell carcinoma (BCC) of eye    CKD (chronic kidney disease), stage III [facility])    nephrologist-- dr Bea Laura. Signe Colt   Complication of anesthesia    post op acute urinary retention   DDD (degenerative disc disease)    Fatigue    pt evalulation by cardiology--- dr Mackey Birchwood, note 05-21-2019 in epic,(non cardiac)  normal echo and event monitor showed extra beats of atrial tachycardia , no atrial fib, and rare ecotpy   Full dentures    GERD (gastroesophageal reflux disease)    History of basal cell carcinoma (BCC) excision    12-25-2018 MOH's reconstruction left lower eyelid   History of external beam radiation therapy 2002   prostate cancer  and boost with radioative prostate seed implants   History of prostate cancer DX  2002   S/P EXTERNAL RADIATION/ RADIOACTIVE SEED IMPLANTS  2003   History of squamous cell carcinoma excision    2012--  RIGHT LOWER EXTREM;  2019 LEFT LOWER EXTREMITIY   HTN (hypertension)    followed by pcp   Hyperlipidemia    Hypothyroidism    followed by pcp--- acquired atrophy due to  radiation;  previously seen by endocrinologist-  dr balan--- per pt takes his own thyroid med. called NP Thyroid 90 mg daily, does not take synthroid   Large vessel vasculitis (HCC)    rheumotologist-  dr syed --- IgG4 related aoritis involving renal arteries ,  pt treated with predisone.  (09-06-2019  per pt no longer prednisone last taken one month ago 07/ 2021, weaned off by pcp per pt request)   Left renal artery stenosis [facility])    nephrologist--- dr Signe Colt--- with aneurysm proximal    Lymphedema of left leg    followed by [facility]---- pt stated uses compression hose   OSA (obstructive sleep apnea) 09-06-2019  per pt lasted used cpap approx. 2019, stated felt he no longer needed    pt retested 11-13-2016 at [facility]  Neurology w/ dr ather-- moderate to severe OSA , AHI 16.4/h/  03-24-2017    Pulmonary nodule followed by [facility]   per CT 06-11-2019  in Care Everywhere in epic done at [facility],  LUL nodule , not mets   Renal cyst, right    Restless legs syndrome (RLS)    S/P radiation therapy     for tonsillar cancer (head and neck) completed 01-26-2012 at [facility]);   and XRT for right lower leg sarcoma --- completed 06-06-2017   Saliva decreased    Sarcoma of lower extremity, right (HCC) oncolgoist-  dr Genella Mech ([facility]) & Dr Leane Platt ([facility])   dx 02-14-2017 w/ needle core bx;  high grade pleomorphic spindle cell sarcoma , grade 2 (cT2N0M0); 03-15-2017  radical resection sarcoma tumor right lower leg  and completed radiation 06-06-2017   Self-catheterizes urinary bladder    QID   AND   PRN   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+) (Stage IVA)---- dx oct 2013  ----s/p left tonsillectomy and concurent chemo and radiation/  ended 01-26-2012-- no surgical intervention---  residuals ( dry mouth, decreased saliva)   oncologist at [facility]--  dr brizel--  HX OF -- NO RECURRENCE   Urethral false passage 07/2019   Urethral stricture urologist--- dr Annabell Howells   chronic---  post urethral dilation's   Urinary retention with incomplete bladder emptying    Vitamin B 12 deficiency 01/28/2011   Vitamin D deficiency     Past Surgical History:  Procedure Laterality Date   BALLOON DILATION N/A 11/10/2014   Procedure: CYSTO BALLOON DILATION AND RETROGRADE URETHROGRAM ;  Surgeon: Alfredo Martinez, MD;  Location: [facility];  Service: Urology;  Laterality: N/A;   CATARACT EXTRACTION W/ INTRAOCULAR LENS  IMPLANT, BILATERAL  2012  approx   CYSTO/ BALLOON DILATION OF  URETHRAL STRICTURE  12-25-2010   CYSTOSCOPY WITH RETROGRADE URETHROGRAM N/A 03/30/2016   Procedure: CYSTOSCOPY WITH RETROGRADE URETHROGRAM AND BALLOON DILATION with cystogram;   Surgeon: Alfredo Martinez, MD;  Location: [facility];  Service: Urology;  Laterality: N/A;   CYSTOSCOPY WITH URETHRAL DILATATION  05/31/2011   Procedure: CYSTOSCOPY WITH URETHRAL DILATATION;  Surgeon: Martina Sinner, MD;  [facility];  Service: Urology;  Laterality: N/A;  BALLOON DILATION   CYSTOSCOPY WITH URETHRAL DILATATION N/A 09/13/2016   Procedure: CYSTOSCOPY WITH URETHRAL BALLOON DILATATION;  Surgeon: Alfredo Martinez, MD;  Location: [facility];  Service: Urology;  Laterality: N/A;   CYSTOSCOPY WITH URETHRAL DILATATION N/A 03/30/2017   Procedure: CYSTOSCOPY WITH BALLOON URETHRAL DILATATION;  Surgeon: Alfredo Martinez, MD;  Location: [facility];  Service: Urology;  Laterality: N/A;   CYSTOSCOPY WITH URETHRAL DILATATION N/A 09/12/2019   Procedure: CYSTOSCOPY WITH URETHRAL DILATATION;  Surgeon: Bjorn Pippin, MD;  Location[facility];  Service: Urology;  Laterality: N/A;   CYSTOSCOPY/RETROGRADE/URETEROSCOPY N/A 05/22/2012   Procedure: CYSTOSCOPY BALLOON DILATION RETROGRADE URETEROGRAM ;  Surgeon: Martina Sinner, MD;  Location: [facility];  Service: Urology;  Laterality: N/A;   CYTSO/  DILATATION URETHRAL STRICTURE/  BX PROSTATIC URETHRA/  REMOVAL FOREIGN BODIES  06-25-2010   [facility]   ECTROPION REPAIR Left 06/18/2019   lower eyelid   INGUINAL HERNIA REPAIR Right 2000   MOHS SURGERY  01/ 2019    [facility]   left lower leg for [facility]   MOHS SURGERY  12/25/2018   for [facility] of left lower eyelid;  the second stage Hughs flap release 01-09-2019   RADICAL RESECTION OF SARCOMA TUMOR   03-15-2017   [facility]   RIGHT LOWER LEG , CALF AREA   RADIOACTIVE SEED IMPLANTS, PROSTATE  JAN  2003   SKIN LESION EXCISION  07/2012   MOST  right shoulder   TONSILLECTOMY Left 11-10-2011  @[facility]    tonsillar cancer   TRANSURETHRAL INCISION OF BLADDER NECK N/A 09/12/2019   Procedure: TRANSURETHRAL INCISION OF BLADDER NECK;   Surgeon: Bjorn Pippin, MD;  Location: [facility];  Service: Urology;  Laterality: N/A;    Allergies  Allergen Reactions   Cefixime Rash   Contrast Media [Iodinated Contrast Media] Other (See Comments)    Avoid due to renal disease   Macrobid [Nitrofurantoin Macrocrystal] Swelling   Nsaids Other (See Comments)    Has been told no NSAIDs, Ibuprofen Renal insufficiency hx.    Cephalosporins Rash    Outpatient Encounter Medications as of 12/26/2022  Medication Sig   buPROPion (WELLBUTRIN) 100 MG tablet Take 1 tablet by mouth twice daily   Cholecalciferol (VITAMIN D) 50 MCG (2000 UT) CAPS Take by mouth every other day.   cyanocobalamin (VITAMIN B12) 1000 MCG/ML injection INJECT 1 ML INTRAMUSCULARLY  FOR 14 DAYS   ketoconazole (NIZORAL) 2 % cream Apply 1 Application topically daily.   lisinopril (ZESTRIL) 10 MG tablet Take 1 tablet by mouth once daily   NP THYROID 120 MG tablet Take 120 mg by mouth. 6 days a week   RESVERATROL PO Take 100 mg by mouth every other day.   sildenafil (VIAGRA) 100 MG tablet Take 100 mg by mouth as needed.   SYRINGE-NEEDLE, DISP, 3 ML (B-D 3CC LUER-LOK SYR 25GX5/8") 25G X 5/8" 3 ML MISC USE FOR INJECTION EVERY 14 DAYS   temazepam (RESTORIL) 7.5 MG capsule Take 1 capsule (7.5 mg total) by mouth at bedtime.   TURMERIC CURCUMIN PO Take 2 tablets by mouth daily.    UNABLE TO FIND [place] take once daily   UNABLE TO FIND [place] take 4 tablets once daily   UNABLE TO FIND Comprehensive immune support take 2 tablets once daily   UNABLE TO FIND Modified citrus pectin take 4 tablets once daily   UNABLE TO FIND Celery seed take 2 tablets by mouth once daily   UNABLE TO FIND Maxivision multiple vitamin for eyes   Garlic (GARLIQUE PO) Take by mouth every other day. (Patient not taking: Reported on 12/26/2022)   No facility-administered encounter medications on file as of 12/26/2022.    Review of Systems:  Review of Systems  Health Maintenance   Topic Date Due   Medicare Annual Wellness (AWV)  11/13/2020   COVID-19 Vaccine (8 - 2023-24 season) 12/14/2022   Pneumonia Vaccine 42+ Years old  Completed   HPV VACCINES  Aged Out   DTaP/Tdap/Td  Discontinued   INFLUENZA VACCINE  Discontinued   Zoster Vaccines- Shingrix  Discontinued    Physical Exam: Vitals:   12/26/22 1359  BP: 136/78  Pulse: 72  Resp: 16  Temp: 97.8 F (36.6 C)  TempSrc: Temporal  SpO2: 97%  Weight: 157 lb (71.2 kg)  Height: 5\' 11"  (1.803 m)   Body mass index is 21.9 kg/m. Weight: 157 lb (71.2 kg)  Wt Readings from Last 3 Encounters:  12/26/22 157 lb (71.2 kg)  11/22/22 159 lb 3.2 oz (72.2 kg)  10/25/22 158 lb (71.7 kg)    Physical Exam Constitutional:      General: He is not in acute distress.    Appearance: He is not diaphoretic.  HENT:     Head: Normocephalic and atraumatic.  Neck:     Thyroid: No thyromegaly.     Vascular: No JVD.     Trachea: No tracheal deviation.  Cardiovascular:     Rate and Rhythm: Normal rate and regular rhythm.     Heart sounds: No murmur heard. Pulmonary:     Effort: Pulmonary effort is normal. No respiratory distress.     Breath sounds: Normal breath sounds. No wheezing.  Lymphadenopathy:     Cervical: No cervical adenopathy.  Skin:    General: Skin is warm and dry.     Findings: Rash (Maculopapular dry scaly rash to back) present.  Neurological:     Mental Status: He is alert and oriented to person, place, and time.     Cranial Nerves: No cranial nerve deficit.  Psychiatric:     Comments: depressed     Labs reviewed: Basic Metabolic Panel: Recent Labs    03/11/22 0000 05/13/22 1502 09/29/22 0000  NA 137 137 141  K 4.7 5.2 4.6  CL 101 101 103  CO2 25* 29 28*  GLUCOSE  --  106*  --   BUN 30* 38* 28*  CREATININE 1.5* 1.72* 1.5*  CALCIUM 9.0 8.3* 9.0   Liver Function Tests: Recent Labs    03/11/22 0000 09/29/22 0000  AST  --  15  ALT  --  12  ALKPHOS  --  76  ALBUMIN 4.2 3.9   No  results for input(s): "LIPASE", "AMYLASE" in the last 8760 hours. No results for input(s): "AMMONIA" in the last 8760 hours. CBC: Recent Labs    03/11/22 0000 05/13/22 1502 09/29/22 0000  WBC 4.7 7.4 5.5  NEUTROABS 2.60 5,321  --   HGB 13.8 11.8* 13.2*  HCT 40* 34.7* 39*  MCV  --  93.3  --   PLT 200 280 221   Lipid Panel: Recent Labs    09/29/22 0000  CHOL 231*  HDL 62  LDLCALC 152  TRIG 84   No results found for: "HGBA1C"  Procedures since last visit: No results found.  Assessment/Plan  1. Moderate episode of recurrent major depressive disorder (HCC) Wellbutrin was increased to three times daily per Dr Donell Beers and needs more time to work He will f/u with him in January. We have tried going up before and he likely will need a second drug.  2. Rash Itchy rash to back  - triamcinolone cream (KENALOG) 0.1 %; Apply 1 Application topically 2 (two) times daily for 15 days. Apply to affected area twice a day for 10 day and then  prn  Dispense: 30 g; Refill: 0  3. Primary insomnia  Can take temazepam 7.5 at bedtime 2 caps if one is not effective for sleep Most of his symptoms and fatigue are related to depression  4. Hypothyroidism TSH elevated  Dr Talmage Nap increased NP thyroid, repeat TSH 12/11 per their notes.    Labs/tests ordered:  * No order type specified * CBC Lipid CMP prior to apt Next appt:  3 months with Dr Chales Abrahams     Total time  time greater than 50% of total time spent doing pt counseling and coordination of care

## 2022-12-26 NOTE — Patient Instructions (Signed)
I recommend trying two capsules of Restoril on nights when you can not sleep well  Try triamcinolone to itch skin on your back twice daily for 10 days

## 2022-12-27 DIAGNOSIS — N35012 Post-traumatic membranous urethral stricture: Secondary | ICD-10-CM | POA: Diagnosis not present

## 2022-12-27 DIAGNOSIS — N3020 Other chronic cystitis without hematuria: Secondary | ICD-10-CM | POA: Diagnosis not present

## 2022-12-28 DIAGNOSIS — E039 Hypothyroidism, unspecified: Secondary | ICD-10-CM | POA: Diagnosis not present

## 2023-01-02 ENCOUNTER — Other Ambulatory Visit: Payer: Self-pay | Admitting: Internal Medicine

## 2023-01-02 DIAGNOSIS — E538 Deficiency of other specified B group vitamins: Secondary | ICD-10-CM

## 2023-01-02 NOTE — Telephone Encounter (Signed)
Patient has request refill on Vitamin B12 injection. Medication pend and sent to PCP Mahlon Gammon, MD

## 2023-01-12 DIAGNOSIS — D3132 Benign neoplasm of left choroid: Secondary | ICD-10-CM | POA: Diagnosis not present

## 2023-01-12 DIAGNOSIS — H353133 Nonexudative age-related macular degeneration, bilateral, advanced atrophic without subfoveal involvement: Secondary | ICD-10-CM | POA: Diagnosis not present

## 2023-01-12 DIAGNOSIS — H548 Legal blindness, as defined in USA: Secondary | ICD-10-CM | POA: Diagnosis not present

## 2023-01-12 DIAGNOSIS — H52203 Unspecified astigmatism, bilateral: Secondary | ICD-10-CM | POA: Diagnosis not present

## 2023-01-12 DIAGNOSIS — Z961 Presence of intraocular lens: Secondary | ICD-10-CM | POA: Diagnosis not present

## 2023-02-04 ENCOUNTER — Emergency Department (HOSPITAL_COMMUNITY)
Admission: EM | Admit: 2023-02-04 | Discharge: 2023-02-05 | Disposition: A | Discharge status: Home / Self Care | Payer: Medicare Other | Attending: Emergency Medicine | Admitting: Emergency Medicine

## 2023-02-04 ENCOUNTER — Emergency Department (HOSPITAL_COMMUNITY): Payer: Medicare Other

## 2023-02-04 ENCOUNTER — Encounter (HOSPITAL_COMMUNITY): Payer: Self-pay

## 2023-02-04 DIAGNOSIS — W19XXXA Unspecified fall, initial encounter: Secondary | ICD-10-CM | POA: Diagnosis not present

## 2023-02-04 DIAGNOSIS — Y9301 Activity, walking, marching and hiking: Secondary | ICD-10-CM | POA: Insufficient documentation

## 2023-02-04 DIAGNOSIS — M21371 Foot drop, right foot: Secondary | ICD-10-CM | POA: Insufficient documentation

## 2023-02-04 DIAGNOSIS — N189 Chronic kidney disease, unspecified: Secondary | ICD-10-CM | POA: Diagnosis not present

## 2023-02-04 DIAGNOSIS — I129 Hypertensive chronic kidney disease with stage 1 through stage 4 chronic kidney disease, or unspecified chronic kidney disease: Secondary | ICD-10-CM | POA: Insufficient documentation

## 2023-02-04 DIAGNOSIS — Z8546 Personal history of malignant neoplasm of prostate: Secondary | ICD-10-CM | POA: Insufficient documentation

## 2023-02-04 DIAGNOSIS — Z85818 Personal history of malignant neoplasm of other sites of lip, oral cavity, and pharynx: Secondary | ICD-10-CM | POA: Diagnosis not present

## 2023-02-04 DIAGNOSIS — S0990XA Unspecified injury of head, initial encounter: Secondary | ICD-10-CM | POA: Diagnosis not present

## 2023-02-04 DIAGNOSIS — S01111A Laceration without foreign body of right eyelid and periocular area, initial encounter: Secondary | ICD-10-CM | POA: Diagnosis not present

## 2023-02-04 DIAGNOSIS — Z79899 Other long term (current) drug therapy: Secondary | ICD-10-CM | POA: Diagnosis not present

## 2023-02-04 DIAGNOSIS — S0993XA Unspecified injury of face, initial encounter: Secondary | ICD-10-CM | POA: Diagnosis present

## 2023-02-04 DIAGNOSIS — Z23 Encounter for immunization: Secondary | ICD-10-CM | POA: Diagnosis not present

## 2023-02-04 LAB — COMPREHENSIVE METABOLIC PANEL
ALT: 15 U/L (ref 0–44)
AST: 15 U/L (ref 15–41)
Albumin: 3.5 g/dL (ref 3.5–5.0)
Alkaline Phosphatase: 54 U/L (ref 38–126)
Anion gap: 11 (ref 5–15)
BUN: 35 mg/dL — ABNORMAL HIGH (ref 8–23)
CO2: 24 mmol/L (ref 22–32)
Calcium: 8.9 mg/dL (ref 8.9–10.3)
Chloride: 103 mmol/L (ref 98–111)
Creatinine, Ser: 1.86 mg/dL — ABNORMAL HIGH (ref 0.61–1.24)
GFR, Estimated: 34 mL/min — ABNORMAL LOW (ref >60)
Glucose, Bld: 102 mg/dL — ABNORMAL HIGH (ref 70–99)
Potassium: 4.3 mmol/L (ref 3.5–5.1)
Sodium: 138 mmol/L (ref 135–145)
Total Bilirubin: 0.9 mg/dL (ref 0.0–1.2)
Total Protein: 6.3 g/dL — ABNORMAL LOW (ref 6.5–8.1)

## 2023-02-04 LAB — I-STAT CHEM 8, ED
BUN: 34 mg/dL — ABNORMAL HIGH (ref 8–23)
Calcium, Ion: 1.06 mmol/L — ABNORMAL LOW (ref 1.15–1.40)
Chloride: 105 mmol/L (ref 98–111)
Creatinine, Ser: 2 mg/dL — ABNORMAL HIGH (ref 0.61–1.24)
Glucose, Bld: 99 mg/dL (ref 70–99)
HCT: 37 % — ABNORMAL LOW (ref 39.0–52.0)
Hemoglobin: 12.6 g/dL — ABNORMAL LOW (ref 13.0–17.0)
Potassium: 4.4 mmol/L (ref 3.5–5.1)
Sodium: 138 mmol/L (ref 135–145)
TCO2: 26 mmol/L (ref 22–32)

## 2023-02-04 LAB — CBC
HCT: 37.4 % — ABNORMAL LOW (ref 39.0–52.0)
Hemoglobin: 12.7 g/dL — ABNORMAL LOW (ref 13.0–17.0)
MCH: 32.2 pg (ref 26.0–34.0)
MCHC: 34 g/dL (ref 30.0–36.0)
MCV: 94.9 fL (ref 80.0–100.0)
Platelets: 158 10*3/uL (ref 150–400)
RBC: 3.94 MIL/uL — ABNORMAL LOW (ref 4.22–5.81)
RDW: 12.7 % (ref 11.5–15.5)
WBC: 6 10*3/uL (ref 4.0–10.5)
nRBC: 0 % (ref 0.0–0.2)

## 2023-02-04 LAB — ETHANOL: Alcohol, Ethyl (B): <10 mg/dL (ref <10)

## 2023-02-04 LAB — TROPONIN I (HIGH SENSITIVITY): Troponin I (High Sensitivity): 17 ng/L (ref <18)

## 2023-02-04 LAB — MAGNESIUM: Magnesium: 2.2 mg/dL (ref 1.7–2.4)

## 2023-02-04 MED ORDER — LIDOCAINE-EPINEPHRINE (PF) 2 %-1:200000 IJ SOLN
10.0000 mL | Freq: Once | INTRAMUSCULAR | Status: AC
  Administered 2023-02-04: 10 mL via INTRADERMAL
  Filled 2023-02-04: qty 20

## 2023-02-04 MED ORDER — TETANUS-DIPHTH-ACELL PERTUSSIS 5-2.5-18.5 LF-MCG/0.5 IM SUSY
0.5000 mL | PREFILLED_SYRINGE | Freq: Once | INTRAMUSCULAR | Status: AC
  Administered 2023-02-04: 0.5 mL via INTRAMUSCULAR
  Filled 2023-02-04: qty 0.5

## 2023-02-04 MED ORDER — LISINOPRIL 10 MG PO TABS
10.0000 mg | ORAL_TABLET | Freq: Once | ORAL | Status: AC
  Administered 2023-02-04: 10 mg via ORAL
  Filled 2023-02-04: qty 1

## 2023-02-04 MED ORDER — LIDOCAINE-EPINEPHRINE-TETRACAINE (LET) TOPICAL GEL
3.0000 mL | Freq: Once | TOPICAL | Status: AC
  Administered 2023-02-04: 3 mL via TOPICAL
  Filled 2023-02-04: qty 3

## 2023-02-04 NOTE — Discharge Instructions (Signed)
Test results today are reassuring.  You received 6 sutures in your right eyebrow.  These will need to be removed in approximately 1 week.  Return to the emergency department if area becomes increasingly red, swollen, painful, or begins draining pus.  Return for any other new symptoms of concern.

## 2023-02-04 NOTE — ED Notes (Signed)
Patient transported to CT 

## 2023-02-04 NOTE — ED Notes (Signed)
Emergency contact called. Message left in regard to transport home.

## 2023-02-04 NOTE — ED Provider Notes (Signed)
[place] EMERGENCY DEPARTMENT AT [facility] Provider Note   CSN: [id number] Arrival date & time: 02/04/23  2032     History  Chief Complaint  Patient presents with   Fall    Patient to ED via EMS with complaint of fall at [facility] related to some weakness and dizziness. Patient presents with laceration to right eye. EMS reports patient was hypertensive in route. 192/123. Patient arrives with an 18 LAC. GCS 15    Philip [name] is a 89 y.o. male.   Fall  Patient presents after fall.  Medical history includes HTN, CKD, HLD, depression, thoracic aortic aneurysm, sarcoma of right leg, lymphedema of left leg, GERD, prostate cancer, squamous cell carcinoma of tonsil.  He had a sarcoma removed from his right leg.  Since then, he has had chronic right foot drop.  He also states that he has chronic transient episodes of dizziness.  Today, he was walking outside and carrying a heavy vase in his right hand.  Due to his right foot drop, his foot caught and this caused him to fall forward.  Because he was holding the base, he was unable to catch himself.  He fell, striking the right side of his face.  He sustained a laceration to right eyebrow area.  EMS was called to the scene.  He was able to stand with EMS.  EMS noted hypertension with otherwise normal vital signs.  Patient currently endorses some mild soreness to area of right face.  He denies any other areas of discomfort.     Home Medications Prior to Admission medications   Medication Sig Start Date End Date Taking? Authorizing Provider  buPROPion (WELLBUTRIN) 100 MG tablet Take 1 tablet (100 mg total) by mouth 3 (three) times daily. 12/26/22   Fletcher Anon, NP  Cholecalciferol (VITAMIN D) 50 MCG (2000 UT) CAPS Take by mouth every other day.    [provider]  cyanocobalamin (VITAMIN B12) 1000 MCG/ML injection INJECT INTRAMUSCULARLY FOR 14 DAYS 01/02/23   Mahlon Gammon, MD  ketoconazole (NIZORAL) 2 % cream  Apply 1 Application topically daily. 04/14/22   [provider]  lisinopril (ZESTRIL) 10 MG tablet Take 1 tablet by mouth once daily 10/21/22   Mahlon Gammon, MD  NP THYROID 120 MG tablet Take 120 mg by mouth. 6 days a week    [provider]  RESVERATROL PO Take 100 mg by mouth every other day.    [provider]  sildenafil (VIAGRA) 100 MG tablet Take 100 mg by mouth as needed. 10/19/22   [provider]  SYRINGE-NEEDLE, DISP, 3 ML (B-D 3CC LUER-LOK SYR 25GX5/8") 25G X 5/8" 3 ML MISC USE FOR INJECTION EVERY 14 DAYS 05/17/22   Mahlon Gammon, MD  temazepam (RESTORIL) 7.5 MG capsule Take 1 capsule (7.5 mg total) by mouth at bedtime. Take 1-2 capsules at bed time for sleep 12/26/22   Fletcher Anon, NP  TURMERIC CURCUMIN PO Take 2 tablets by mouth daily.     [provider]  UNABLE TO FIND [place] Bone take once daily    [provider]  UNABLE TO FIND [place] take 4 tablets once daily    [provider]  UNABLE TO FIND Comprehensive immune support take 2 tablets once daily    [provider]  UNABLE TO FIND Modified citrus pectin take 4 tablets once daily    [provider]  UNABLE TO FIND Celery seed take 2 tablets  by mouth once daily    [provider]  UNABLE TO FIND Maxivision multiple vitamin for eyes    [provider]      Allergies    Cefixime, Contrast media [iodinated contrast media], Macrobid [nitrofurantoin macrocrystal], Nsaids, and Cephalosporins    Review of Systems   Review of Systems  HENT:  Positive for facial swelling.   Skin:  Positive for wound.  All other systems reviewed and are negative.   Physical Exam Updated Vital Signs BP (!) 134/93   Pulse 76   Temp 97.7 F (36.5 C) (Oral)   Resp 18   Ht 5\' 11"  (1.803 m)   Wt 71 kg   SpO2 97%   BMI 21.83 kg/m  Physical Exam Vitals and nursing note reviewed.  Constitutional:      General: He is not in acute  distress.    Appearance: Normal appearance. He is well-developed. He is not ill-appearing, toxic-appearing or diaphoretic.  HENT:     Head: Normocephalic.     Comments: Right periorbital swelling and laceration to right eyebrow area.  Wound is hemostatic.    Right Ear: External ear normal.     Left Ear: External ear normal.     Nose: Nose normal.     Mouth/Throat:     Mouth: Mucous membranes are moist.  Eyes:     Extraocular Movements: Extraocular movements intact.     Conjunctiva/sclera: Conjunctivae normal.  Cardiovascular:     Rate and Rhythm: Normal rate and regular rhythm.  Pulmonary:     Effort: Pulmonary effort is normal. No respiratory distress.  Chest:     Chest wall: No tenderness.  Abdominal:     General: There is no distension.     Palpations: Abdomen is soft.     Tenderness: There is no abdominal tenderness.  Musculoskeletal:        General: No swelling or tenderness.     Cervical back: Normal range of motion and neck supple.  Skin:    General: Skin is warm and dry.     Coloration: Skin is not jaundiced or pale.  Neurological:     General: No focal deficit present.     Mental Status: He is alert and oriented to person, place, and time.     Cranial Nerves: No cranial nerve deficit.     Sensory: No sensory deficit.     Motor: No weakness.     Coordination: Coordination normal.  Psychiatric:        Mood and Affect: Mood normal.        Behavior: Behavior normal.     ED Results / Procedures / Treatments   Labs (all labs ordered are listed, but only abnormal results are displayed) Labs Reviewed  COMPREHENSIVE METABOLIC PANEL - Abnormal; Notable for the following components:      Result Value   Glucose, Bld 102 (*)    BUN 35 (*)    Creatinine, Ser 1.86 (*)    Total Protein 6.3 (*)    GFR, Estimated 34 (*)    All other components within normal limits  CBC - Abnormal; Notable for the following components:   RBC 3.94 (*)    Hemoglobin 12.7 (*)    HCT 37.4  (*)    All other components within normal limits  I-STAT CHEM 8, ED - Abnormal; Notable for the following components:   BUN 34 (*)    Creatinine, Ser 2.00 (*)    Calcium, Ion 1.06 (*)  Hemoglobin 12.6 (*)    HCT 37.0 (*)    All other components within normal limits  ETHANOL  MAGNESIUM  URINALYSIS, ROUTINE W REFLEX MICROSCOPIC  TROPONIN I (HIGH SENSITIVITY)  TROPONIN I (HIGH SENSITIVITY)    EKG None  Radiology DG Pelvis Portable Result Date: 02/04/2023 CLINICAL DATA:  Fall EXAM: PORTABLE PELVIS 1-2 VIEWS COMPARISON:  06/23/2015 FINDINGS: Radiation seeds in the region of the prostate. No acute bony abnormality. Specifically, no fracture, subluxation, or dislocation. Hip joints and SI joints symmetric. IMPRESSION: No acute bony abnormality. Electronically Signed   By: Charlett Nose M.D.   On: 02/04/2023 21:58   DG Chest Port 1 View Result Date: 02/04/2023 CLINICAL DATA:  Fall, weakness EXAM: PORTABLE CHEST 1 VIEW COMPARISON:  None Available. FINDINGS: Heart and mediastinal contours within normal limits. No confluent opacities, effusions or edema. No pneumothorax. No acute bony abnormality. IMPRESSION: No active disease. Electronically Signed   By: Charlett Nose M.D.   On: 02/04/2023 21:57   CT HEAD WO CONTRAST Result Date: 02/04/2023 CLINICAL DATA:  Fall EXAM: CT HEAD WITHOUT CONTRAST CT MAXILLOFACIAL WITHOUT CONTRAST CT CERVICAL SPINE WITHOUT CONTRAST TECHNIQUE: Multidetector CT imaging of the head, cervical spine, and maxillofacial structures were performed using the standard protocol without intravenous contrast. Multiplanar CT image reconstructions of the cervical spine and maxillofacial structures were also generated. RADIATION DOSE REDUCTION: This exam was performed according to the departmental dose-optimization program which includes automated exposure control, adjustment of the mA and/or kV according to patient size and/or use of iterative reconstruction technique. COMPARISON:  None  Available. FINDINGS: CT HEAD FINDINGS Brain: No mass,hemorrhage or extra-axial collection. Normal appearance of the parenchyma and CSF spaces. Vascular: Atherosclerotic calcification of the vertebral and internal carotid arteries at the skull base. No abnormal hyperdensity of the major intracranial arteries or dural venous sinuses. Skull: The visualized skull base, calvarium and extracranial soft tissues are normal. Sinuses/Orbits: No fluid levels or advanced mucosal thickening of the visualized paranasal sinuses. No mastoid or middle ear effusion. Normal orbits. Other: None. CT MAXILLOFACIAL FINDINGS Osseous: No facial fracture or mandibular dislocation. Orbits: The globes are intact. Normal appearance of the intra- and extraconal fat. Symmetric extraocular muscles and optic nerves. Sinuses: No fluid levels or advanced mucosal thickening. Soft tissues: Small right periorbital hematoma CT CERVICAL SPINE FINDINGS Alignment: No static subluxation. Facets are aligned. Occipital condyles and the lateral masses of C1-C2 are aligned. Skull base and vertebrae: No acute fracture. Soft tissues and spinal canal: No prevertebral fluid or swelling. No visible canal hematoma. Disc levels: No advanced spinal canal or neural foraminal stenosis. Upper chest: No pneumothorax, pulmonary nodule or pleural effusion. Other: Normal visualized paraspinal cervical soft tissues. IMPRESSION: 1. No acute intracranial abnormality. 2. No facial fracture. 3. Small right periorbital hematoma. 4. No acute fracture or static subluxation of the cervical spine. Electronically Signed   By: Deatra Robinson M.D.   On: 02/04/2023 21:42   CT MAXILLOFACIAL WO CONTRAST Result Date: 02/04/2023 CLINICAL DATA:  Fall EXAM: CT HEAD WITHOUT CONTRAST CT MAXILLOFACIAL WITHOUT CONTRAST CT CERVICAL SPINE WITHOUT CONTRAST TECHNIQUE: Multidetector CT imaging of the head, cervical spine, and maxillofacial structures were performed using the standard protocol without  intravenous contrast. Multiplanar CT image reconstructions of the cervical spine and maxillofacial structures were also generated. RADIATION DOSE REDUCTION: This exam was performed according to the departmental dose-optimization program which includes automated exposure control, adjustment of the mA and/or kV according to patient size and/or use of iterative reconstruction technique. COMPARISON:  None  Available. FINDINGS: CT HEAD FINDINGS Brain: No mass,hemorrhage or extra-axial collection. Normal appearance of the parenchyma and CSF spaces. Vascular: Atherosclerotic calcification of the vertebral and internal carotid arteries at the skull base. No abnormal hyperdensity of the major intracranial arteries or dural venous sinuses. Skull: The visualized skull base, calvarium and extracranial soft tissues are normal. Sinuses/Orbits: No fluid levels or advanced mucosal thickening of the visualized paranasal sinuses. No mastoid or middle ear effusion. Normal orbits. Other: None. CT MAXILLOFACIAL FINDINGS Osseous: No facial fracture or mandibular dislocation. Orbits: The globes are intact. Normal appearance of the intra- and extraconal fat. Symmetric extraocular muscles and optic nerves. Sinuses: No fluid levels or advanced mucosal thickening. Soft tissues: Small right periorbital hematoma CT CERVICAL SPINE FINDINGS Alignment: No static subluxation. Facets are aligned. Occipital condyles and the lateral masses of C1-C2 are aligned. Skull base and vertebrae: No acute fracture. Soft tissues and spinal canal: No prevertebral fluid or swelling. No visible canal hematoma. Disc levels: No advanced spinal canal or neural foraminal stenosis. Upper chest: No pneumothorax, pulmonary nodule or pleural effusion. Other: Normal visualized paraspinal cervical soft tissues. IMPRESSION: 1. No acute intracranial abnormality. 2. No facial fracture. 3. Small right periorbital hematoma. 4. No acute fracture or static subluxation of the  cervical spine. Electronically Signed   By: Deatra Robinson M.D.   On: 02/04/2023 21:42   CT CERVICAL SPINE WO CONTRAST Result Date: 02/04/2023 CLINICAL DATA:  Fall EXAM: CT HEAD WITHOUT CONTRAST CT MAXILLOFACIAL WITHOUT CONTRAST CT CERVICAL SPINE WITHOUT CONTRAST TECHNIQUE: Multidetector CT imaging of the head, cervical spine, and maxillofacial structures were performed using the standard protocol without intravenous contrast. Multiplanar CT image reconstructions of the cervical spine and maxillofacial structures were also generated. RADIATION DOSE REDUCTION: This exam was performed according to the departmental dose-optimization program which includes automated exposure control, adjustment of the mA and/or kV according to patient size and/or use of iterative reconstruction technique. COMPARISON:  None Available. FINDINGS: CT HEAD FINDINGS Brain: No mass,hemorrhage or extra-axial collection. Normal appearance of the parenchyma and CSF spaces. Vascular: Atherosclerotic calcification of the vertebral and internal carotid arteries at the skull base. No abnormal hyperdensity of the major intracranial arteries or dural venous sinuses. Skull: The visualized skull base, calvarium and extracranial soft tissues are normal. Sinuses/Orbits: No fluid levels or advanced mucosal thickening of the visualized paranasal sinuses. No mastoid or middle ear effusion. Normal orbits. Other: None. CT MAXILLOFACIAL FINDINGS Osseous: No facial fracture or mandibular dislocation. Orbits: The globes are intact. Normal appearance of the intra- and extraconal fat. Symmetric extraocular muscles and optic nerves. Sinuses: No fluid levels or advanced mucosal thickening. Soft tissues: Small right periorbital hematoma CT CERVICAL SPINE FINDINGS Alignment: No static subluxation. Facets are aligned. Occipital condyles and the lateral masses of C1-C2 are aligned. Skull base and vertebrae: No acute fracture. Soft tissues and spinal canal: No  prevertebral fluid or swelling. No visible canal hematoma. Disc levels: No advanced spinal canal or neural foraminal stenosis. Upper chest: No pneumothorax, pulmonary nodule or pleural effusion. Other: Normal visualized paraspinal cervical soft tissues. IMPRESSION: 1. No acute intracranial abnormality. 2. No facial fracture. 3. Small right periorbital hematoma. 4. No acute fracture or static subluxation of the cervical spine. Electronically Signed   By: Deatra Robinson M.D.   On: 02/04/2023 21:42    Procedures .Laceration Repair  Date/Time: 02/04/2023 11:12 PM  Performed by: Gloris Manchester, MD Authorized by: Gloris Manchester, MD   Consent:    Consent obtained:  Verbal   Consent given  by:  Patient   Risks, benefits, and alternatives were discussed: yes     Risks discussed:  Pain, poor cosmetic result and infection   Alternatives discussed:  No treatment Universal protocol:    Procedure explained and questions answered to patient or proxy's satisfaction: yes     Imaging studies available: yes     Patient identity confirmed:  Verbally with patient Anesthesia:    Anesthesia method:  Topical application and local infiltration   Topical anesthetic:  LET   Local anesthetic:  Lidocaine 2% WITH epi Laceration details:    Location:  Face   Face location:  R eyebrow   Length (cm):  3   Depth (mm):  5 Pre-procedure details:    Preparation:  Patient was prepped and draped in usual sterile fashion and imaging obtained to evaluate for foreign bodies Exploration:    Wound exploration: wound explored through full range of motion   Treatment:    Area cleansed with:  Saline   Amount of cleaning:  Standard   Irrigation solution:  Sterile saline   Irrigation volume:  100   Irrigation method:  Syringe   Visualized foreign bodies/material removed: no   Skin repair:    Repair method:  Sutures   Suture size:  6-0   Suture material:  Nylon   Suture technique:  Simple interrupted   Number of sutures:   6 Approximation:    Approximation:  Close Repair type:    Repair type:  Simple Post-procedure details:    Dressing: Xeroform and Tegaderm.   Procedure completion:  Tolerated well, no immediate complications     Medications Ordered in ED Medications  lidocaine-EPINEPHrine-tetracaine (LET) topical gel (3 mLs Topical Given 02/04/23 2119)  lisinopril (ZESTRIL) tablet 10 mg (10 mg Oral Given 02/04/23 2119)  Tdap (BOOSTRIX) injection 0.5 mL (0.5 mLs Intramuscular Given 02/04/23 2129)  lidocaine-EPINEPHrine (XYLOCAINE W/EPI) 2 %-1:200000 (PF) injection 10 mL (10 mLs Intradermal Given 02/04/23 2128)    ED Course/ Medical Decision Making/ A&P                                 Medical Decision Making Amount and/or Complexity of Data Reviewed Labs: ordered. Radiology: ordered.  Risk Prescription drug management.   This patient presents to the ED for concern of fall, this involves an extensive number of treatment options, and is a complaint that carries with it a high risk of complications and morbidity.  The differential diagnosis includes acute injuries   Co morbidities that complicate the patient evaluation  HTN, CKD, HLD, depression, thoracic aortic aneurysm, sarcoma of right leg, lymphedema of left leg, GERD, prostate cancer, squamous cell carcinoma of tonsil   Additional history obtained:  Additional history obtained from EMS External records from outside source obtained and reviewed including EMR   Lab Tests:  I Ordered, and personally interpreted labs.  The pertinent results include: Baseline creatinine, normal electrolytes, baseline anemia, no leukocytosis, normal troponin   Imaging Studies ordered:  I ordered imaging studies including x-ray of chest and pelvis, CT of head, face, cervical spine I independently visualized and interpreted imaging which showed no acute findings I agree with the radiologist interpretation   Cardiac Monitoring: / EKG:  The patient was  maintained on a cardiac monitor.  I personally viewed and interpreted the cardiac monitored which showed an underlying rhythm of: Sinus rhythm  Problem List / ED Course / Critical interventions / Medication management  Patient is a pleasant 89 year old male presenting after a mechanical fall that occurred shortly prior to arrival.  On arrival, vital signs are notable for hypertension.  Home dose of lisinopril was ordered.  During his fall, patient is seen wound to right eyebrow area.  This wound is hemostatic at this time.  Let gel was applied.  Tetanus was updated.  Trauma workup was initiated.  Patient's lab work and imaging study results were unremarkable.  He underwent laceration repair.  Blood pressure improved.  He was discharged in stable condition. I ordered medication including let and lidocaine for local anesthesia, Tdap for tetanus prophylaxis, lisinopril for hypertension Reevaluation of the patient after these medicines showed that the patient improved I have reviewed the patients home medicines and have made adjustments as needed   Social Determinants of Health:  Resides in nursing facility        Final Clinical Impression(s) / ED Diagnoses Final diagnoses:  Fall, initial encounter  Laceration of right eyebrow, initial encounter    Rx / DC Orders ED Discharge Orders     None         Gloris Manchester, MD 02/04/23 2333

## 2023-02-07 NOTE — Progress Notes (Unsigned)
Cardiology Office Note:   Date:  02/08/2023  ID:  Philip, [name] 27-May-1932, MRN [id number] PCP:  Philip Gammon, MD  [facility] Providers Cardiologist:  Philip Skeans, MD Referring MD: Philip Gammon, MD  Chief Complaint/Reason for Referral: Cardiology follow-up ASSESSMENT:    1. Aneurysm of ascending aorta without rupture (HCC)   2. Aortic atherosclerosis (HCC)   3. Hyperlipidemia LDL goal <70   4. Chronic coronary microvascular dysfunction   5. Coronary artery calcification seen on CAT scan   6. Essential hypertension   7. Stage 3b chronic kidney disease (HCC)   8. Chronic fatigue   9. Palpitations     PLAN:   In order of problems listed above: Ascending aortic aneurysm: MRA scheduled for July 2024. Aortic atherosclerosis: Ideally would be on aspirin and a statin but will defer in this patient. Hyperlipidemia: By virtue of presence of aortic atherosclerosis and coronary artery calcification his LDL goal is at least less than 70 however will defer intensive lipid-lowering in this patient of advanced age. Coronary microvascular dysfunction: PET stress test seems to be consistent with coronary microvascular dysfunction.  Remains chest pain-free. Hypertension: Will increase lisinopril to 20 mg daily check a BMP next week. Stage IIIb chronic kidney disease: Continue lisinopril. Fatigue: Check reflex TSH. Palpitations: Check monitor.            Dispo:  Return in about 3 months (around 05/09/2023).      Medication Adjustments/Labs and Tests Ordered: Current medicines are reviewed at length with the patient today.  Concerns regarding medicines are outlined above.  The following changes have been made:     Labs/tests ordered: Orders Placed This Encounter  Procedures   Basic Metabolic Panel (BMET)   TSH Rfx on Abnormal to Free T4   LONG TERM MONITOR (3-14 DAYS)    Medication Changes: Meds ordered this encounter  Medications   lisinopril (ZESTRIL) 20 MG  tablet    Sig: Take 1 tablet (20 mg total) by mouth daily.    Dispense:  90 tablet    Refill:  3    Dose increase    Current medicines are reviewed at length with the patient today.  The patient does not have concerns regarding medicines.  I spent 43 minutes reviewing all clinical data during and prior to this visit including all relevant imaging studies, laboratories, clinical information from other health systems and prior notes from both Cardiology and other specialties, interviewing the patient, conducting a complete physical examination, and coordinating care in order to formulate a comprehensive and personalized evaluation and treatment plan.   History of Present Illness:      FOCUSED PROBLEM LIST:   Aortitis IgG4 syndrome treated with prednisone Thoracic aortic aneurysm 4.5 cm MRA June 2024 Prostate cancer Pleomorphic sarcoma of right lower extremity Status post surgery and radiation; followed at [facility] Obstructive sleep apnea On CPAP CKD stage IIIb Coronary artery calcification PET stress test 2024 Aortic atherosclerosis Abdominal ultrasound 2019 Hyperlipidemia Abnormal PET stress test 2024 No evidence of ischemia but myocardial blood flow reserve was abnormal at 1.6 In conjunction with TTE without WMA and moderate coronary calcification >> coronary microvascular dysfunction BMI 21 Hypertension Diastolic dysfunction Mild LVH TTE June 2024 Pulmonary hypertension PA systolic pressure 39 mmHg TTE June 2024 LLE chronic lymphedema [facility]  1/25: The patient returns for routine follow-up.  He was seen last March.  He had previously been seen in 2021 for fatigue.  His workup including an echocardiogram  and monitor were fairly reassuring.  The echocardiogram demonstrated normal ejection fraction.  His monitor demonstrated sinus bradycardia and NSVT with brief episodes of atrial tachycardia.  When he was seen in March he was referred for a PET  stress test which showed no ischemia but did show abnormal myocardial blood flow reserve.  In conjunction with the lack of ischemia, multivessel severe coronary artery calcification, or abnormal ejection fraction this seems consistent with coronary microvascular dysfunction.  He was seen in the emergency department in early January due to a fall after getting his feet tangled.  His evaluation there was reassuring and his EKG demonstrated no ischemic changes or rhythm disturbances.  The patient is here with his daughter.  Apparently [facility] his blood pressures have been highly elevated.  He has been compliant with his medications.  He did miss his dose of lisinopril however this morning.  His biggest issue today is occasional dizziness.  He notices this when he goes from sitting to standing quickly.  He also has had palpitations on occasion his fatigue which he was seen for in 2021 remains an ongoing issue.  He tells me that he is compliant with his CPAP.  He recently had his CPAP settings changed within the last year with an increase in pressure.  He has not really noticed much of a difference and may be a slight improvement in his fatigue with this maneuver.  He has had no bleeding or bruising, denies any chest pain, and has had no frank syncope.          Current Medications: Current Meds  Medication Sig   Ascorbic Acid (VITAMIN C PO) Take by mouth. daily   buPROPion (WELLBUTRIN) 100 MG tablet Take 1 tablet (100 mg total) by mouth 3 (three) times daily.   Cholecalciferol (VITAMIN D) 50 MCG (2000 UT) CAPS Take by mouth every other day.   cyanocobalamin (VITAMIN B12) 1000 MCG/ML injection INJECT INTRAMUSCULARLY FOR 14 DAYS   ketoconazole (NIZORAL) 2 % cream Apply 1 Application topically daily.   lisinopril (ZESTRIL) 20 MG tablet Take 1 tablet (20 mg total) by mouth daily.   Multiple Vitamins-Minerals (PRESERVISION AREDS 2 PO) Take by mouth. daily   NP THYROID 120 MG tablet  Take 120 mg by mouth. 6 days a week   SYRINGE-NEEDLE, DISP, 3 ML (B-D 3CC LUER-LOK SYR 25GX5/8") 25G X 5/8" 3 ML MISC USE FOR INJECTION EVERY 14 DAYS   [DISCONTINUED] lisinopril (ZESTRIL) 10 MG tablet Take 1 tablet by mouth once daily     Review of Systems:   Please see the history of present illness.    All other systems reviewed and are negative.     EKGs/Labs/Other Test Reviewed:   EKG: EKG performed January 2025 demonstrates sinus rhythm with left ventricular hypertrophy  EKG Interpretation Date/Time:    Ventricular Rate:    PR Interval:    QRS Duration:    QT Interval:    QTC Calculation:   R Axis:      Text Interpretation:           Risk Assessment/Calculations:          Physical Exam:   VS:  BP (!) 160/90   Pulse 71   Ht 5\' 11"  (1.803 m)   Wt 152 lb 12.8 oz (69.3 kg)   SpO2 96%   BMI 21.31 kg/m    HYPERTENSION CONTROL Vitals:   02/08/23 1436 02/08/23 1514  BP: (!) 152/100 (!) 160/90  The patient's blood pressure is elevated above target today.  In order to address the patient's elevated BP: A current anti-hypertensive medication was adjusted today.      Wt Readings from Last 3 Encounters:  02/08/23 152 lb 12.8 oz (69.3 kg)  02/04/23 156 lb 8.4 oz (71 kg)  12/26/22 157 lb (71.2 kg)      GENERAL:  No apparent distress, AOx3 HEENT:  No carotid bruits, +2 carotid impulses, no scleral icterus: Right periorbital bruise CAR: RRR  no murmurs, gallops, rubs, or thrills RES:  Clear to auscultation bilaterally ABD:  Soft, nontender, nondistended, positive bowel sounds x 4 VASC:  +2 radial pulses, +2 carotid pulses NEURO:  CN 2-12 grossly intact; motor and sensory grossly intact PSYCH:  No active depression or anxiety EXT: Left lower extremity lymphedema.  Signed, Orbie Pyo, MD  02/08/2023 3:23 PM    [facility] [address] [place], [place], [place]  [address] Phone: [phone]; Fax: [phone]   Note:  This document  was prepared using Dragon voice recognition software and may include unintentional dictation errors.

## 2023-02-08 ENCOUNTER — Encounter: Payer: Self-pay | Admitting: Internal Medicine

## 2023-02-08 ENCOUNTER — Ambulatory Visit: Payer: Medicare Other | Attending: Internal Medicine | Admitting: Internal Medicine

## 2023-02-08 ENCOUNTER — Ambulatory Visit (INDEPENDENT_AMBULATORY_CARE_PROVIDER_SITE_OTHER): Payer: Medicare Other

## 2023-02-08 VITALS — BP 160/90 | HR 71 | Ht 71.0 in | Wt 152.8 lb

## 2023-02-08 DIAGNOSIS — I2510 Atherosclerotic heart disease of native coronary artery without angina pectoris: Secondary | ICD-10-CM

## 2023-02-08 DIAGNOSIS — I2585 Chronic coronary microvascular dysfunction: Secondary | ICD-10-CM | POA: Diagnosis present

## 2023-02-08 DIAGNOSIS — R002 Palpitations: Secondary | ICD-10-CM

## 2023-02-08 DIAGNOSIS — I10 Essential (primary) hypertension: Secondary | ICD-10-CM

## 2023-02-08 DIAGNOSIS — E785 Hyperlipidemia, unspecified: Secondary | ICD-10-CM

## 2023-02-08 DIAGNOSIS — N1832 Chronic kidney disease, stage 3b: Secondary | ICD-10-CM

## 2023-02-08 DIAGNOSIS — I7121 Aneurysm of the ascending aorta, without rupture: Secondary | ICD-10-CM | POA: Diagnosis present

## 2023-02-08 DIAGNOSIS — I700 Atherosclerosis of aorta: Secondary | ICD-10-CM | POA: Diagnosis present

## 2023-02-08 DIAGNOSIS — R5382 Chronic fatigue, unspecified: Secondary | ICD-10-CM | POA: Diagnosis present

## 2023-02-08 MED ORDER — LISINOPRIL 20 MG PO TABS: 20.0000 mg | ORAL_TABLET | Freq: Every day | ORAL | 3 refills | Status: DC

## 2023-02-08 NOTE — Progress Notes (Unsigned)
Applied a 7 day Zio XT monitor to patient in the office 

## 2023-02-08 NOTE — Patient Instructions (Addendum)
Medication Instructions:  Your physician has recommended you make the following change in your medication:  1.) increase lisinopril 20 mg - one tablet daily  *If you need a refill on your cardiac medications before your next appointment, please call your pharmacy*   Lab Work: Go to [facility] in one week for blood work (reflex TSH and bmet)   Testing/Procedures: 7 day [facility] [facility] - see instructions below   Follow-Up: At [facility], you and your health needs are our priority.  As part of our continuing mission to provide you with exceptional heart care, we have created designated Provider Care Teams.  These Care Teams include your primary Cardiologist (physician) and Advanced Practice Providers (APPs -  Physician Assistants and Nurse Practitioners) who all work together to provide you with the care you need, when you need it.    Your next appointment:   3 month(s)  Provider:   Jari Favre, PA-C, Ronie Spies, PA-C, Robin Searing, NP, Jacolyn Reedy, PA-C, Eligha Bridegroom, NP, Tereso Newcomer, PA-C, or Perlie Gold, PA-C       Christena Deem- Long Term Monitor Instructions  Your physician has requested you wear a ZIO patch monitor for 7 days.  This is a single patch monitor. [employer] supplies one patch monitor per enrollment. Additional stickers are not available. Please do not apply patch if you will be having a Nuclear Stress Test,  Echocardiogram, Cardiac CT, MRI, or Chest Xray during the period you would be wearing the  monitor. The patch cannot be worn during these tests. You cannot remove and re-apply the  ZIO XT patch monitor.  Your ZIO patch monitor will be mailed 3 day USPS to your address on file. It may take 3-5 days  to receive your monitor after you have been enrolled.  Once you have received your monitor, please review the enclosed instructions. Your monitor  has already been registered assigning a specific monitor serial # to you.  Billing and Patient Assistance Program  Information  We have supplied [employer] with any of your insurance information on file for billing purposes. [employer] offers a sliding scale Patient Assistance Program for patients that do not have  insurance, or whose insurance does not completely cover the cost of the ZIO monitor.  You must apply for the Patient Assistance Program to qualify for this discounted rate.  To apply, please call [employer] at [phone], select option 4, select option 2, ask to apply for  Patient Assistance Program. Meredeth Ide will ask your household income, and how many people  are in your household. They will quote your out-of-pocket cost based on that information.  [employer] will also be able to set up a 54-month, interest-free payment plan if needed.  Applying the monitor   Shave hair from upper left chest.  Hold abrader disc by orange tab. Rub abrader in 40 strokes over the upper left chest as  indicated in your monitor instructions.  Clean area with 4 enclosed alcohol pads. Let dry.  Apply patch as indicated in monitor instructions. Patch will be placed under collarbone on left  side of chest with arrow pointing upward.  Rub patch adhesive wings for 2 minutes. Remove white label marked "1". Remove the white  label marked "2". Rub patch adhesive wings for 2 additional minutes.  While looking in a mirror, press and release button in center of patch. A small green light will  flash 3-4 times. This will be your only indicator that the monitor has been turned on.  Do not shower for the first 24 hours. You may shower after the first 24 hours.  Press the button if you feel a symptom. You will hear a small click. Record Date, Time and  Symptom in the Patient Logbook.  When you are ready to remove the patch, follow instructions on the last 2 pages of Patient  Logbook. Stick patch monitor onto the last page of Patient Logbook.  Place Patient Logbook in the blue and white box. Use locking tab on box and tape box closed   securely. The blue and white box has prepaid postage on it. Please place it in the mailbox as  soon as possible. Your physician should have your test results approximately 7 days after the  monitor has been mailed back to [facility].  Call [facility] Customer Care at [phone] if you have questions regarding  your ZIO XT patch monitor. Call them immediately if you see an orange light blinking on your  monitor.  If your monitor falls off in less than 4 days, contact our Monitor department at [phone].  If your monitor becomes loose or falls off after 4 days call [employer] at [phone] for  suggestions on securing your monitor    1st Floor: - Lobby - Registration  - Pharmacy  - Lab - Cafe  2nd Floor: - PV Lab - Diagnostic Testing (echo, CT, nuclear med)  3rd Floor: - Vacant  4th Floor: - TCTS (cardiothoracic surgery) - AFib Clinic - Structural Heart Clinic - Vascular Surgery  - Vascular Ultrasound  5th Floor: - HeartCare Cardiology (general and EP) - Clinical Pharmacy for coumadin, hypertension, lipid, weight-loss medications, and med management appointments    Valet parking services will be available as well.

## 2023-02-13 ENCOUNTER — Other Ambulatory Visit: Payer: Self-pay | Admitting: Internal Medicine

## 2023-02-24 ENCOUNTER — Encounter: Payer: Self-pay | Admitting: Internal Medicine

## 2023-02-28 ENCOUNTER — Telehealth: Payer: Self-pay | Admitting: Internal Medicine

## 2023-02-28 NOTE — Telephone Encounter (Signed)
Lab reqs faxed to [facility] at [facility] [phone].

## 2023-02-28 NOTE — Telephone Encounter (Signed)
[facility] requesting lab orders faxed to them.   Fax # [phone]

## 2023-03-02 ENCOUNTER — Ambulatory Visit (INDEPENDENT_AMBULATORY_CARE_PROVIDER_SITE_OTHER): Payer: Medicare Other | Admitting: Adult Health

## 2023-03-02 ENCOUNTER — Telehealth: Payer: Self-pay

## 2023-03-02 ENCOUNTER — Encounter: Payer: Self-pay | Admitting: Adult Health

## 2023-03-02 VITALS — BP 100/64 | HR 70 | Temp 97.9°F | Resp 19 | Ht 71.0 in | Wt 152.8 lb

## 2023-03-02 DIAGNOSIS — R220 Localized swelling, mass and lump, head: Secondary | ICD-10-CM

## 2023-03-02 DIAGNOSIS — E034 Atrophy of thyroid (acquired): Secondary | ICD-10-CM | POA: Diagnosis not present

## 2023-03-02 DIAGNOSIS — I129 Hypertensive chronic kidney disease with stage 1 through stage 4 chronic kidney disease, or unspecified chronic kidney disease: Secondary | ICD-10-CM

## 2023-03-02 DIAGNOSIS — F339 Major depressive disorder, recurrent, unspecified: Secondary | ICD-10-CM

## 2023-03-02 DIAGNOSIS — N183 Chronic kidney disease, stage 3 unspecified: Secondary | ICD-10-CM

## 2023-03-02 LAB — LAB REPORT - SCANNED: EGFR: 34

## 2023-03-02 MED ORDER — AMLODIPINE BESYLATE 2.5 MG PO TABS
2.5000 mg | ORAL_TABLET | Freq: Every day | ORAL | 3 refills | Status: DC
Start: 2023-03-02 — End: 2023-04-06

## 2023-03-02 NOTE — Telephone Encounter (Signed)
Patient daughter Diannia Ruder called and states that he was just seen at [facility] and wanted you to know that his BP medication Lisinopril has been stopped completely and that makes her nervous because he just increased dosage or should we call the Cardiologist.

## 2023-03-02 NOTE — Telephone Encounter (Signed)
Patient daughter would like a response when the cardiologist responds. She states she is just nervous because the BP has been high.

## 2023-03-02 NOTE — Progress Notes (Signed)
[facility] clinic  Provider:  Kenard Gower DNP  Code Status:  DNR  Goals of Care:     03/02/2023    9:56 AM  Advanced Directives  Does Patient Have a Medical Advance Directive? Yes  Type of Advance Directive Living will;Healthcare Power of [place];Out of facility DNR (pink MOST or yellow form)  Copy of Healthcare Power of Attorney in Chart? Yes - validated most recent copy scanned in chart (See row information)     Chief Complaint  Patient presents with   lip pain    Swelling of the bottom lip.Marland Kitchen Discuss the need for AWV and covid.    Discussed the use of AI scribe software for clinical note transcription with the patient, who gave verbal consent to proceed.  HPI: Patient is a 89 y.o. male seen today for an acute visit for swelling of lower lip.  He has swelling of the mouth that began this morning, specifically on the lower lip. No associated pain, difficulty breathing, or itching. He does not recall consuming any new foods or using any new products that could have triggered the swelling. No swelling noted on his upper lip.  He has been experiencing dizziness for the past three to six months, typically upon waking and sometimes when standing or sitting. The dizziness is intermittent and does not occur every day.  No dizziness today despite a blood pressure of 100/64 mmHg.  He experienced an episode of bowel incontinence this morning, which he describes as unexpected and a first-time occurrence. He mentions a history of slight constipation and uses Miralax every couple of days, though he has not taken it recently.  His current medications include Wellbutrin 100 mg three times a day for depression and lisinopril 20 mg daily for blood pressure management. He has been on lisinopril for many years without prior issues. He resides in [place] and does not drive due to macular degeneration, which affects his ability to read.     He was seen by cardiology, Dr. Alverda Skeans, on  02/08/23 and Lisinopril was increased from 10 mg daily to 20 mg daily due to elevated BP, 160/90 and 152/100.   Past Medical History:  Diagnosis Date   Age-related macular degeneration, dry, both eyes    Aortitis syndrome (HCC) rheumotologist-  dr syed   IgG4 syndrome--  (effects abdomine) ,  previously long term use prednisone last taken 07/ 2021   Basal cell carcinoma (BCC) of eye    CKD (chronic kidney disease), stage III [facility])    nephrologist-- dr Bea Laura. Signe Colt   Complication of anesthesia    post op acute urinary retention   DDD (degenerative disc disease)    Fatigue    pt evalulation by cardiology--- dr Mackey Birchwood, note 05-21-2019 in epic,(non cardiac)  normal echo and event monitor showed extra beats of atrial tachycardia , no atrial fib, and rare ecotpy   Full dentures    GERD (gastroesophageal reflux disease)    History of basal cell carcinoma (BCC) excision    12-25-2018 MOH's reconstruction left lower eyelid   History of external beam radiation therapy 2002   prostate cancer  and boost with radioative prostate seed implants   History of prostate cancer DX  2002   S/P EXTERNAL RADIATION/ RADIOACTIVE SEED IMPLANTS  2003   History of squamous cell carcinoma excision    2012--  RIGHT LOWER EXTREM;  2019 LEFT LOWER EXTREMITIY   HTN (hypertension)    followed by pcp   Hyperlipidemia  Hypothyroidism    followed by pcp--- acquired atrophy due to radiation;  previously seen by endocrinologist-  dr balan--- per pt takes his own thyroid med. called NP Thyroid 90 mg daily, does not take synthroid   Large vessel vasculitis (HCC)    rheumotologist-  dr syed --- IgG4 related aoritis involving renal arteries ,  pt treated with predisone.  (09-06-2019  per pt no longer prednisone last taken one month ago 07/ 2021, weaned off by pcp per pt request)   Left renal artery stenosis [facility])    nephrologist--- dr Signe Colt--- with aneurysm proximal    Lymphedema of left leg    followed by [facility]---- pt stated uses compression hose   OSA (obstructive sleep apnea) 09-06-2019  per pt lasted used cpap approx. 2019, stated felt he no longer needed    pt retested 11-13-2016 at [facility] w/ dr ather-- moderate to severe OSA , AHI 16.4/h/  03-24-2017    Pulmonary nodule followed by [facility]   per CT 06-11-2019  in Care Everywhere in epic done at [facility],  LUL nodule , not mets   Renal cyst, right    Restless legs syndrome (RLS)    S/P radiation therapy     for tonsillar cancer (head and neck) completed 01-26-2012 at [facility]);   and XRT for right lower leg sarcoma --- completed 06-06-2017   Saliva decreased    Sarcoma of lower extremity, right (HCC) oncolgoist-  dr Genella Mech ([facility]) & Dr Leane Platt ([facility])   dx 02-14-2017 w/ needle core bx;  high grade pleomorphic spindle cell sarcoma , grade 2 (cT2N0M0); 03-15-2017  radical resection sarcoma tumor right lower leg  and completed radiation 06-06-2017   Self-catheterizes urinary bladder    QID   AND   PRN   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+) (Stage IVA)---- dx oct 2013  ----s/p left tonsillectomy and concurent chemo and radiation/  ended 01-26-2012-- no surgical intervention---  residuals ( dry mouth, decreased saliva)   oncologist at [facility]--  dr brizel--  HX OF -- NO RECURRENCE   Urethral false passage 07/2019   Urethral stricture urologist--- dr Annabell Howells   chronic---  post urethral dilation's   Urinary retention with incomplete bladder emptying    Vitamin B 12 deficiency 01/28/2011   Vitamin D deficiency     Past Surgical History:  Procedure Laterality Date   BALLOON DILATION N/A 11/10/2014   Procedure: CYSTO BALLOON DILATION AND RETROGRADE URETHROGRAM ;  Surgeon: Alfredo Martinez, MD;  Location: [facility];  Service: Urology;  Laterality: N/A;   CATARACT EXTRACTION W/ INTRAOCULAR LENS  IMPLANT, BILATERAL  2012  approx   CYSTO/  BALLOON DILATION OF  URETHRAL STRICTURE  12-25-2010   CYSTOSCOPY WITH RETROGRADE URETHROGRAM N/A 03/30/2016   Procedure: CYSTOSCOPY WITH RETROGRADE URETHROGRAM AND BALLOON DILATION with cystogram;  Surgeon: Alfredo Martinez, MD;  Location: [facility];  Service: Urology;  Laterality: N/A;   CYSTOSCOPY WITH URETHRAL DILATATION  05/31/2011   Procedure: CYSTOSCOPY WITH URETHRAL DILATATION;  Surgeon: Martina Sinner, MD;  Location: [facility];  Service: Urology;  Laterality: N/A;  BALLOON DILATION   CYSTOSCOPY WITH URETHRAL DILATATION N/A 09/13/2016   Procedure: CYSTOSCOPY WITH URETHRAL BALLOON DILATATION;  Surgeon: Alfredo Martinez, MD;  Location: [facility];  Service: Urology;  Laterality: N/A;   CYSTOSCOPY WITH URETHRAL DILATATION N/A 03/30/2017   Procedure: CYSTOSCOPY WITH  BALLOON URETHRAL DILATATION;  Surgeon: Alfredo Martinez, MD;  Location: [facility];  Service: Urology;  Laterality: N/A;   CYSTOSCOPY WITH URETHRAL DILATATION N/A 09/12/2019   Procedure: CYSTOSCOPY WITH URETHRAL DILATATION;  Surgeon: Bjorn Pippin, MD;  Location: [facility];  Service: Urology;  Laterality: N/A;   CYSTOSCOPY/RETROGRADE/URETEROSCOPY N/A 05/22/2012   Procedure: CYSTOSCOPY BALLOON DILATION RETROGRADE URETEROGRAM ;  Surgeon: Martina Sinner, MD;  Location: [facility];  Service: Urology;  Laterality: N/A;   CYTSO/  DILATATION URETHRAL STRICTURE/  BX PROSTATIC URETHRA/  REMOVAL FOREIGN BODIES  06-25-2010   [facility]   ECTROPION REPAIR Left 06/18/2019   lower eyelid   INGUINAL HERNIA REPAIR Right 2000   MOHS SURGERY  01/ 2019    [facility]   left lower leg for [facility]   MOHS SURGERY  12/25/2018   for [facility] of left lower eyelid;  the second stage Hughs flap release 01-09-2019   RADICAL RESECTION OF SARCOMA TUMOR   03-15-2017   [facility]   RIGHT LOWER LEG , CALF AREA   RADIOACTIVE SEED IMPLANTS, PROSTATE  JAN  2003   SKIN LESION EXCISION  07/2012    [facility]  right shoulder   TONSILLECTOMY Left 11-10-2011  @[facility]    tonsillar cancer   TRANSURETHRAL INCISION OF BLADDER NECK N/A 09/12/2019   Procedure: TRANSURETHRAL INCISION OF BLADDER NECK;  Surgeon: Bjorn Pippin, MD;  Location: [facility];  Service: Urology;  Laterality: N/A;    Allergies  Allergen Reactions   Cefixime Rash   Contrast Media [Iodinated Contrast Media] Other (See Comments)    Avoid due to renal disease   Macrobid [Nitrofurantoin Macrocrystal] Swelling   Nsaids Other (See Comments)    Has been told no NSAIDs, Ibuprofen Renal insufficiency hx.    Cephalosporins Rash    Outpatient Encounter Medications as of 03/02/2023  Medication Sig   buPROPion (WELLBUTRIN) 100 MG tablet Take 1 tablet (100 mg total) by mouth 3 (three) times daily.   cyanocobalamin (VITAMIN B12) 1000 MCG/ML injection INJECT INTRAMUSCULARLY FOR 14 DAYS   lisinopril (ZESTRIL) 20 MG tablet Take 1 tablet (20 mg total) by mouth daily.   NP THYROID 120 MG tablet Take 120 mg by mouth. 6 days a week   Ascorbic Acid (VITAMIN C PO) Take by mouth. daily (Patient not taking: Reported on 03/02/2023)   Cholecalciferol (VITAMIN D) 50 MCG (2000 UT) CAPS Take by mouth every other day. (Patient not taking: Reported on 03/02/2023)   ketoconazole (NIZORAL) 2 % cream Apply 1 Application topically daily. (Patient not taking: Reported on 03/02/2023)   Multiple Vitamins-Minerals (PRESERVISION AREDS 2 PO) Take by mouth. daily (Patient not taking: Reported on 03/02/2023)   SYRINGE-NEEDLE, DISP, 3 ML (B-D 3CC LUER-LOK SYR 25GX5/8") 25G X 5/8" 3 ML MISC USE FOR INJECTION EVERY 14 DAYS (Patient not taking: Reported on 03/02/2023)   No facility-administered encounter medications on file as of 03/02/2023.    Review of Systems:  Review of Systems  Constitutional:  Negative for activity change, appetite change and fever.  HENT:  Negative for sore throat.   Eyes: Negative.   Cardiovascular:  Negative for chest pain and  leg swelling.  Gastrointestinal:  Negative for abdominal distention, diarrhea and vomiting.  Genitourinary:  Negative for dysuria, frequency and urgency.  Skin:  Negative for color change.  Neurological:  Positive for dizziness. Negative for headaches.  Psychiatric/Behavioral:  Negative for behavioral problems and sleep disturbance. The patient is not nervous/anxious.     Health  Maintenance  Topic Date Due   Medicare Annual Wellness (AWV)  11/13/2020   COVID-19 Vaccine (8 - 2024-25 season) 12/14/2022   Pneumonia Vaccine 22+ Years old  Completed   HPV VACCINES  Aged Out   DTaP/Tdap/Td  Discontinued   INFLUENZA VACCINE  Discontinued   Zoster Vaccines- Shingrix  Discontinued    Physical Exam: Vitals:   03/02/23 0948  BP: 100/64  Pulse: 70  Resp: 19  Temp: 97.9 F (36.6 C)  SpO2: 96%  Weight: 152 lb 12.8 oz (69.3 kg)  Height: 5\' 11"  (1.803 m)   Body mass index is 21.31 kg/m. Physical Exam Constitutional:      Appearance: Normal appearance.  HENT:     Head: Normocephalic and atraumatic.     Nose: No rhinorrhea.     Mouth/Throat:     Mouth: Mucous membranes are moist.     Comments: Lower lip swollen  Eyes:     Conjunctiva/sclera: Conjunctivae normal.  Cardiovascular:     Rate and Rhythm: Normal rate and regular rhythm.     Pulses: Normal pulses.     Heart sounds: Normal heart sounds.  Pulmonary:     Effort: Pulmonary effort is normal.     Breath sounds: Normal breath sounds.  Abdominal:     General: Bowel sounds are normal.     Palpations: Abdomen is soft.  Musculoskeletal:        General: No swelling. Normal range of motion.     Cervical back: Normal range of motion.  Skin:    General: Skin is warm and dry.     Comments: Lower lip swollen  Neurological:     General: No focal deficit present.     Mental Status: He is alert and oriented to person, place, and time.  Psychiatric:        Mood and Affect: Mood normal.        Behavior: Behavior normal.         Thought Content: Thought content normal.        Judgment: Judgment normal.     Labs reviewed: Basic Metabolic Panel: Recent Labs    05/13/22 1502 09/29/22 0000 02/04/23 2105 02/04/23 2128  NA 137 141 138 138  K 5.2 4.6 4.3 4.4  CL 101 103 103 105  CO2 29 28* 24  --   GLUCOSE 106*  --  102* 99  BUN 38* 28* 35* 34*  CREATININE 1.72* 1.5* 1.86* 2.00*  CALCIUM 8.3* 9.0 8.9  --   MG  --   --  2.2  --    Liver Function Tests: Recent Labs    03/11/22 0000 09/29/22 0000 02/04/23 2105  AST  --  15 15  ALT  --  12 15  ALKPHOS  --  76 54  BILITOT  --   --  0.9  PROT  --   --  6.3*  ALBUMIN 4.2 3.9 3.5   No results for input(s): "LIPASE", "AMYLASE" in the last 8760 hours. No results for input(s): "AMMONIA" in the last 8760 hours. CBC: Recent Labs    03/11/22 0000 05/13/22 1502 09/29/22 0000 02/04/23 2105 02/04/23 2128  WBC 4.7 7.4 5.5 6.0  --   NEUTROABS 2.60 5,321  --   --   --   HGB 13.8 11.8* 13.2* 12.7* 12.6*  HCT 40* 34.7* 39* 37.4* 37.0*  MCV  --  93.3  --  94.9  --   PLT 200 280 221 158  --  Lipid Panel: Recent Labs    09/29/22 0000  CHOL 231*  HDL 62  LDLCALC 152  TRIG 84   No results found for: "HGBA1C"  Procedures since last visit: LONG TERM MONITOR (3-14 DAYS) Result Date: 02/24/2023 Patch Wear Time:  7 days and 2 hours (2025-01-22T15:35:55-0500 to 2025-01-29T18:04:23-0500) Patient had a min HR of 40 bpm, max HR of 154 bpm, and avg HR of 71 bpm. Predominant underlying rhythm was Sinus Rhythm. EVENTS: -1 run of Ventricular Tachycardia occurred lasting 4 beats with a max rate of 104 bpm (avg 101 bpm). -49 Supraventricular Tachycardia runs occurred, the run with the fastest interval lasting 6 beats with a max rate of 154 bpm, the longest lasting 16 beats with an avg rate of 103 bpm. Some episodes of Supraventricular Tachycardia may be possible Atrial Tachycardia with variable block. Second Degree AV Block-Mobitz I (Wenckebach) was present. -Isolated SVEs  were occasional (3.4%, [id number]), SVE Couplets were rare (<1.0%, 1296), and SVE Triplets were rare (<1.0%, 109). -Isolated VEs were rare (<1.0%, 3215), VE Couplets were rare (<1.0%, 41), and VE Triplets were rare (<1.0%, 8). Ventricular Bigeminy and Trigeminy were present. No atrial fibrillation, sustained ventricular tachyarrhythmias, or bradyarrhythmias were detected. Patient triggered events corresponded with sinus rhythm and PACs.   DG Pelvis Portable Result Date: 02/04/2023 CLINICAL DATA:  Fall EXAM: PORTABLE PELVIS 1-2 VIEWS COMPARISON:  06/23/2015 FINDINGS: Radiation seeds in the region of the prostate. No acute bony abnormality. Specifically, no fracture, subluxation, or dislocation. Hip joints and SI joints symmetric. IMPRESSION: No acute bony abnormality. Electronically Signed   By: Charlett Nose M.D.   On: 02/04/2023 21:58   DG Chest Port 1 View Result Date: 02/04/2023 CLINICAL DATA:  Fall, weakness EXAM: PORTABLE CHEST 1 VIEW COMPARISON:  None Available. FINDINGS: Heart and mediastinal contours within normal limits. No confluent opacities, effusions or edema. No pneumothorax. No acute bony abnormality. IMPRESSION: No active disease. Electronically Signed   By: Charlett Nose M.D.   On: 02/04/2023 21:57   CT HEAD WO CONTRAST Result Date: 02/04/2023 CLINICAL DATA:  Fall EXAM: CT HEAD WITHOUT CONTRAST CT MAXILLOFACIAL WITHOUT CONTRAST CT CERVICAL SPINE WITHOUT CONTRAST TECHNIQUE: Multidetector CT imaging of the head, cervical spine, and maxillofacial structures were performed using the standard protocol without intravenous contrast. Multiplanar CT image reconstructions of the cervical spine and maxillofacial structures were also generated. RADIATION DOSE REDUCTION: This exam was performed according to the departmental dose-optimization program which includes automated exposure control, adjustment of the mA and/or kV according to patient size and/or use of iterative reconstruction technique. COMPARISON:   None Available. FINDINGS: CT HEAD FINDINGS Brain: No mass,hemorrhage or extra-axial collection. Normal appearance of the parenchyma and CSF spaces. Vascular: Atherosclerotic calcification of the vertebral and internal carotid arteries at the skull base. No abnormal hyperdensity of the major intracranial arteries or dural venous sinuses. Skull: The visualized skull base, calvarium and extracranial soft tissues are normal. Sinuses/Orbits: No fluid levels or advanced mucosal thickening of the visualized paranasal sinuses. No mastoid or middle ear effusion. Normal orbits. Other: None. CT MAXILLOFACIAL FINDINGS Osseous: No facial fracture or mandibular dislocation. Orbits: The globes are intact. Normal appearance of the intra- and extraconal fat. Symmetric extraocular muscles and optic nerves. Sinuses: No fluid levels or advanced mucosal thickening. Soft tissues: Small right periorbital hematoma CT CERVICAL SPINE FINDINGS Alignment: No static subluxation. Facets are aligned. Occipital condyles and the lateral masses of C1-C2 are aligned. Skull base and vertebrae: No acute fracture. Soft tissues and spinal canal: No prevertebral  fluid or swelling. No visible canal hematoma. Disc levels: No advanced spinal canal or neural foraminal stenosis. Upper chest: No pneumothorax, pulmonary nodule or pleural effusion. Other: Normal visualized paraspinal cervical soft tissues. IMPRESSION: 1. No acute intracranial abnormality. 2. No facial fracture. 3. Small right periorbital hematoma. 4. No acute fracture or static subluxation of the cervical spine. Electronically Signed   By: Deatra Robinson M.D.   On: 02/04/2023 21:42   CT MAXILLOFACIAL WO CONTRAST Result Date: 02/04/2023 CLINICAL DATA:  Fall EXAM: CT HEAD WITHOUT CONTRAST CT MAXILLOFACIAL WITHOUT CONTRAST CT CERVICAL SPINE WITHOUT CONTRAST TECHNIQUE: Multidetector CT imaging of the head, cervical spine, and maxillofacial structures were performed using the standard protocol  without intravenous contrast. Multiplanar CT image reconstructions of the cervical spine and maxillofacial structures were also generated. RADIATION DOSE REDUCTION: This exam was performed according to the departmental dose-optimization program which includes automated exposure control, adjustment of the mA and/or kV according to patient size and/or use of iterative reconstruction technique. COMPARISON:  None Available. FINDINGS: CT HEAD FINDINGS Brain: No mass,hemorrhage or extra-axial collection. Normal appearance of the parenchyma and CSF spaces. Vascular: Atherosclerotic calcification of the vertebral and internal carotid arteries at the skull base. No abnormal hyperdensity of the major intracranial arteries or dural venous sinuses. Skull: The visualized skull base, calvarium and extracranial soft tissues are normal. Sinuses/Orbits: No fluid levels or advanced mucosal thickening of the visualized paranasal sinuses. No mastoid or middle ear effusion. Normal orbits. Other: None. CT MAXILLOFACIAL FINDINGS Osseous: No facial fracture or mandibular dislocation. Orbits: The globes are intact. Normal appearance of the intra- and extraconal fat. Symmetric extraocular muscles and optic nerves. Sinuses: No fluid levels or advanced mucosal thickening. Soft tissues: Small right periorbital hematoma CT CERVICAL SPINE FINDINGS Alignment: No static subluxation. Facets are aligned. Occipital condyles and the lateral masses of C1-C2 are aligned. Skull base and vertebrae: No acute fracture. Soft tissues and spinal canal: No prevertebral fluid or swelling. No visible canal hematoma. Disc levels: No advanced spinal canal or neural foraminal stenosis. Upper chest: No pneumothorax, pulmonary nodule or pleural effusion. Other: Normal visualized paraspinal cervical soft tissues. IMPRESSION: 1. No acute intracranial abnormality. 2. No facial fracture. 3. Small right periorbital hematoma. 4. No acute fracture or static subluxation of the  cervical spine. Electronically Signed   By: Deatra Robinson M.D.   On: 02/04/2023 21:42   CT CERVICAL SPINE WO CONTRAST Result Date: 02/04/2023 CLINICAL DATA:  Fall EXAM: CT HEAD WITHOUT CONTRAST CT MAXILLOFACIAL WITHOUT CONTRAST CT CERVICAL SPINE WITHOUT CONTRAST TECHNIQUE: Multidetector CT imaging of the head, cervical spine, and maxillofacial structures were performed using the standard protocol without intravenous contrast. Multiplanar CT image reconstructions of the cervical spine and maxillofacial structures were also generated. RADIATION DOSE REDUCTION: This exam was performed according to the departmental dose-optimization program which includes automated exposure control, adjustment of the mA and/or kV according to patient size and/or use of iterative reconstruction technique. COMPARISON:  None Available. FINDINGS: CT HEAD FINDINGS Brain: No mass,hemorrhage or extra-axial collection. Normal appearance of the parenchyma and CSF spaces. Vascular: Atherosclerotic calcification of the vertebral and internal carotid arteries at the skull base. No abnormal hyperdensity of the major intracranial arteries or dural venous sinuses. Skull: The visualized skull base, calvarium and extracranial soft tissues are normal. Sinuses/Orbits: No fluid levels or advanced mucosal thickening of the visualized paranasal sinuses. No mastoid or middle ear effusion. Normal orbits. Other: None. CT MAXILLOFACIAL FINDINGS Osseous: No facial fracture or mandibular dislocation. Orbits: The globes are intact.  Normal appearance of the intra- and extraconal fat. Symmetric extraocular muscles and optic nerves. Sinuses: No fluid levels or advanced mucosal thickening. Soft tissues: Small right periorbital hematoma CT CERVICAL SPINE FINDINGS Alignment: No static subluxation. Facets are aligned. Occipital condyles and the lateral masses of C1-C2 are aligned. Skull base and vertebrae: No acute fracture. Soft tissues and spinal canal: No  prevertebral fluid or swelling. No visible canal hematoma. Disc levels: No advanced spinal canal or neural foraminal stenosis. Upper chest: No pneumothorax, pulmonary nodule or pleural effusion. Other: Normal visualized paraspinal cervical soft tissues. IMPRESSION: 1. No acute intracranial abnormality. 2. No facial fracture. 3. Small right periorbital hematoma. 4. No acute fracture or static subluxation of the cervical spine. Electronically Signed   By: Deatra Robinson M.D.   On: 02/04/2023 21:42    Assessment/Plan  1. Lip swelling (Primary) -  Sudden onset of lower lip swelling without pruritus, dyspnea, or recent exposure to new foods or products. Possible side effect of Lisinopril. -Discontinue Lisinopril due to suspicion of drug-induced angioedema. -Advise patient to seek immediate medical attention if experiencing shortness of breath or wheezing.  2. Benign hypertension with chronic kidney disease, stage III (HCC) -  Blood pressure 100/64, patient has been on Lisinopril for many years. -Discontinue Lisinopril due to suspicion of drug-induced angioedema -  messaged cardiologist, Dr. Lynnette Caffey, about swollen lip and Lisinopril being discontinued and he recommended starting patient on Amlodipine -  will start Amlodipine 2.5 mg daily -  check BP daily, log and bring to next appointment -  follow up in 1 week - amLODipine (NORVASC) 2.5 MG tablet; Take 1 tablet (2.5 mg total) by mouth daily.  Dispense: 30 tablet; Refill: 3 -  discussed new medication with patient over the phone  3. Hypothyroidism due to acquired atrophy of thyroid Lab Results  Component Value Date   TSH 0.97 07/29/2021    -  continue NP Thyroid  4. Major depression, recurrent, chronic (HCC) -  Reports slight depression, on Wellbutrin 100mg  three times a day. -Continue Wellbutrin as prescribed. -Reassured patient's safety and daily functioning are not affected.     Labs/tests ordered:  None  Next appt:  03/29/2023

## 2023-03-03 ENCOUNTER — Encounter: Payer: Self-pay | Admitting: Internal Medicine

## 2023-03-03 NOTE — Telephone Encounter (Signed)
Called and made daughter aware.

## 2023-03-21 LAB — HEPATIC FUNCTION PANEL
ALT: 20 U/L (ref 10–40)
AST: 19 (ref 14–40)
Alkaline Phosphatase: 76 (ref 25–125)
Bilirubin, Total: 0.4

## 2023-03-21 LAB — LIPID PANEL
Cholesterol: 201 — AB (ref 0–200)
HDL: 72 — AB (ref 35–70)
LDL Cholesterol: 113
Triglycerides: 78 (ref 40–160)

## 2023-03-21 LAB — CBC AND DIFFERENTIAL
HCT: 37 — AB (ref 41–53)
Hemoglobin: 12.3 — AB (ref 13.5–17.5)
Platelets: 209 10*3/uL (ref 150–400)
WBC: 4.8

## 2023-03-21 LAB — CBC: RBC: 3.88 (ref 3.87–5.11)

## 2023-03-21 LAB — COMPREHENSIVE METABOLIC PANEL
Albumin: 3.9 (ref 3.5–5.0)
Calcium: 9.1 (ref 8.7–10.7)
Globulin: 2.5
eGFR: 37

## 2023-03-21 LAB — BASIC METABOLIC PANEL
BUN: 32 — AB (ref 4–21)
CO2: 27 — AB (ref 13–22)
Chloride: 100 (ref 99–108)
Creatinine: 1.4 — AB (ref 0.6–1.3)
Glucose: 106
Potassium: 4.4 meq/L (ref 3.5–5.1)
Sodium: 138 (ref 137–147)

## 2023-03-21 LAB — LAB REPORT - SCANNED: EGFR: 37

## 2023-03-22 ENCOUNTER — Telehealth: Payer: Self-pay

## 2023-03-22 NOTE — Telephone Encounter (Signed)
 Copied from CRM [id number]. Topic: Appointments - Appointment Info/Confirmation >> Mar 22, 2023 12:04 PM Philip [name] wrote: Patient/patient representative is calling for information regarding an appointment.  Spoke with patient and he was a little bit confused on where to go for his appointment.  I told the patient that his appointment it is [facility].

## 2023-03-29 ENCOUNTER — Encounter: Payer: Medicare Other | Admitting: Internal Medicine

## 2023-04-04 ENCOUNTER — Encounter: Payer: Self-pay | Admitting: Internal Medicine

## 2023-04-04 ENCOUNTER — Non-Acute Institutional Stay: Admitting: Internal Medicine

## 2023-04-04 VITALS — BP 162/98 | HR 70 | Temp 98.0°F | Resp 19 | Ht 71.0 in | Wt 159.0 lb

## 2023-04-04 DIAGNOSIS — F339 Major depressive disorder, recurrent, unspecified: Secondary | ICD-10-CM | POA: Diagnosis not present

## 2023-04-04 DIAGNOSIS — E034 Atrophy of thyroid (acquired): Secondary | ICD-10-CM | POA: Diagnosis not present

## 2023-04-04 DIAGNOSIS — I129 Hypertensive chronic kidney disease with stage 1 through stage 4 chronic kidney disease, or unspecified chronic kidney disease: Secondary | ICD-10-CM

## 2023-04-04 DIAGNOSIS — R220 Localized swelling, mass and lump, head: Secondary | ICD-10-CM | POA: Diagnosis not present

## 2023-04-04 DIAGNOSIS — R21 Rash and other nonspecific skin eruption: Secondary | ICD-10-CM

## 2023-04-04 DIAGNOSIS — F5101 Primary insomnia: Secondary | ICD-10-CM

## 2023-04-04 DIAGNOSIS — G4733 Obstructive sleep apnea (adult) (pediatric): Secondary | ICD-10-CM

## 2023-04-04 DIAGNOSIS — E538 Deficiency of other specified B group vitamins: Secondary | ICD-10-CM

## 2023-04-04 DIAGNOSIS — N183 Chronic kidney disease, stage 3 unspecified: Secondary | ICD-10-CM

## 2023-04-04 DIAGNOSIS — R42 Dizziness and giddiness: Secondary | ICD-10-CM

## 2023-04-04 DIAGNOSIS — E782 Mixed hyperlipidemia: Secondary | ICD-10-CM

## 2023-04-04 NOTE — Progress Notes (Signed)
 Location:  [facility] features editor of Service:  Clinic (12)  Provider:   Code Status:  Goals of Care:     04/04/2023   10:32 AM  Advanced Directives  Does Patient Have a Medical Advance Directive? Yes  Type of Estate agent of [place];Living will;Out of facility DNR (pink MOST or yellow form)  Does patient want to make changes to medical advance directive? No - Patient declined  Copy of Healthcare Power of Attorney in Chart? Yes - validated most recent copy scanned in chart (See row information)     Chief Complaint  Patient presents with   Follow-up    3 month follow up. Discuss the need for AWV.    HPI: Patient is a 89 y.o. male seen today for medical management of chronic diseases.    [place]  Recently had adverse reaction to Lisinopril with Lip swelling Lisinopril changed to Norvasc   Discussed the use of AI scribe software for clinical note transcription with the patient, who gave verbal consent to proceed.  History of Present Illness     Discussed the use of AI scribe software for clinical note transcription with the patient, who gave verbal consent to proceed.  History of Present Illness   The patient, with a history of hypertension, macular degeneration, and depression, presents with concerns about high blood pressure readings at home.  He reported a reading of 190/124 at home, but a subsequent reading at the clinic was 168/98.   He admitted to forgetting to take his amlodipine on the day of the appointment due to a lapse in picking up his prescription.  He also reported a history of angioedema due to an allergic reaction to lisinopril, which has since been discontinued.  The patient has been experiencing ongoing issues with macular degeneration, which has significantly impacted his ability to read and perform daily tasks. He reported that his right eye seems to be improving slightly with treatment, but his left  eye's vision is gradually worsening.  In terms of his mental health, he reported a long-standing history of depression, which he believes has been slightly improved with Wellbutrin. However, he also noted that his memory seems to be declining, with difficulty remembering common words and terms.  The patient also reported occasional dizziness, which seems to improve with increased fluid intake. He also mentioned slight pain in his legs and the presence of small bumps on his skin, which he has been managing with a steroid cream and CeraVe.  He has stopped taking several supplements, including vitamin D, but continues to take AREDS for his eyes and B12 injections every two weeks. He also reported occasional constipation, which he manages with Miralax as needed.      Other History  Also Has Past medical  history of    sarcoma of Right lower extremity Follows closely with oncology at [facility] for CT scan of his chest for lung nodules. Has been in remission. Recent Imaging has not shown any issues   History of prostate cancer. History of tonsillar cancer Macular degeneration follows with ophthalmologist   Aortitis syndrome.  Follows with Dr. Kathi Ludwig who sometimes has to use prednisone as needed   B12 deficiency takes injection self administer   Hypothyroid follows with Dr. Talmage Nap    CKD follows with Renal   Sleep Apnea CPAP     Past Medical History:  Diagnosis Date   Age-related macular degeneration, dry, both eyes  Aortitis syndrome (HCC) rheumotologist-  dr syed   IgG4 syndrome--  (effects abdomine) ,  previously long term use prednisone last taken 07/ 2021   Basal cell carcinoma (BCC) of eye    CKD (chronic kidney disease), stage III [facility])    nephrologist-- dr Bea Laura. Signe Colt   Complication of anesthesia    post op acute urinary retention   DDD (degenerative disc disease)    Fatigue    pt evalulation by cardiology--- dr Mackey Birchwood, note 05-21-2019 in epic,(non cardiac)  normal echo and  event monitor showed extra beats of atrial tachycardia , no atrial fib, and rare ecotpy   Full dentures    GERD (gastroesophageal reflux disease)    History of basal cell carcinoma (BCC) excision    12-25-2018 MOH's reconstruction left lower eyelid   History of external beam radiation therapy 2002   prostate cancer  and boost with radioative prostate seed implants   History of prostate cancer DX  2002   S/P EXTERNAL RADIATION/ RADIOACTIVE SEED IMPLANTS  2003   History of squamous cell carcinoma excision    2012--  RIGHT LOWER EXTREM;  2019 LEFT LOWER EXTREMITIY   HTN (hypertension)    followed by pcp   Hyperlipidemia    Hypothyroidism    followed by pcp--- acquired atrophy due to radiation;  previously seen by endocrinologist-  dr Talmage Nap--- per pt takes his own thyroid med. called NP Thyroid 90 mg daily, does not take synthroid   Large vessel vasculitis (HCC)    rheumotologist-  dr syed --- IgG4 related aoritis involving renal arteries ,  pt treated with predisone.  (09-06-2019  per pt no longer prednisone last taken one month ago 07/ 2021, weaned off by pcp per pt request)   Left renal artery stenosis [facility])    nephrologist--- dr Signe Colt--- with aneurysm proximal    Lymphedema of left leg    followed by [facility]---- pt stated uses compression hose   OSA (obstructive sleep apnea) 09-06-2019  per pt lasted used cpap approx. 2019, stated felt he no longer needed    pt retested 11-13-2016 at [facility] w/ dr ather-- moderate to severe OSA , AHI 16.4/h/  03-24-2017    Pulmonary nodule followed by [facility]   per CT 06-11-2019  in Care Everywhere in epic done at [facility],  LUL nodule , not mets   Renal cyst, right    Restless legs syndrome (RLS)    S/P radiation therapy     for tonsillar cancer (head and neck) completed 01-26-2012 at [facility]);   and XRT for right lower leg sarcoma --- completed 06-06-2017   Saliva decreased    Sarcoma of lower extremity, right (HCC)  oncolgoist-  dr Genella Mech ([facility]) & Dr Leane Platt ([facility])   dx 02-14-2017 w/ needle core bx;  high grade pleomorphic spindle cell sarcoma , grade 2 (cT2N0M0); 03-15-2017  radical resection sarcoma tumor right lower leg  and completed radiation 06-06-2017   Self-catheterizes urinary bladder    QID   AND   PRN   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+) (Stage IVA)---- dx oct 2013  ----s/p left tonsillectomy and concurent chemo and radiation/  ended 01-26-2012-- no surgical intervention---  residuals ( dry mouth, decreased saliva)   oncologist at [facility]--  dr brizel--  HX OF -- NO RECURRENCE   Urethral false passage 07/2019   Urethral stricture urologist--- dr Annabell Howells  chronic---  post urethral dilation's   Urinary retention with incomplete bladder emptying    Vitamin B 12 deficiency 01/28/2011   Vitamin D deficiency     Past Surgical History:  Procedure Laterality Date   BALLOON DILATION N/A 11/10/2014   Procedure: CYSTO BALLOON DILATION AND RETROGRADE URETHROGRAM ;  Surgeon: Alfredo Martinez, MD;  [facility];  Service: Urology;  Laterality: N/A;   CATARACT EXTRACTION W/ INTRAOCULAR LENS  IMPLANT, BILATERAL  2012  approx   CYSTO/ BALLOON DILATION OF  URETHRAL STRICTURE  12-25-2010   CYSTOSCOPY WITH RETROGRADE URETHROGRAM N/A 03/30/2016   Procedure: CYSTOSCOPY WITH RETROGRADE URETHROGRAM AND BALLOON DILATION with cystogram;  Surgeon: Alfredo Martinez, MD;  Location: [facility];  Service: Urology;  Laterality: N/A;   CYSTOSCOPY WITH URETHRAL DILATATION  05/31/2011   Procedure: CYSTOSCOPY WITH URETHRAL DILATATION;  Surgeon: Martina Sinner, MD;  Location: [facility] [place];  Service: Urology;  Laterality: N/A;  BALLOON DILATION   CYSTOSCOPY WITH URETHRAL DILATATION N/A 09/13/2016   Procedure: CYSTOSCOPY WITH URETHRAL BALLOON DILATATION;  Surgeon: Alfredo Martinez, MD;  Location: [facility]  [place];  Service: Urology;  Laterality: N/A;   CYSTOSCOPY WITH URETHRAL DILATATION N/A 03/30/2017   Procedure: CYSTOSCOPY WITH BALLOON URETHRAL DILATATION;  Surgeon: Alfredo Martinez, MD;  Location: [facility] [place];  Service: Urology;  Laterality: N/A;   CYSTOSCOPY WITH URETHRAL DILATATION N/A 09/12/2019   Procedure: CYSTOSCOPY WITH URETHRAL DILATATION;  Surgeon: Bjorn Pippin, MD;  Location: [facility];  Service: Urology;  Laterality: N/A;   CYSTOSCOPY/RETROGRADE/URETEROSCOPY N/A 05/22/2012   Procedure: CYSTOSCOPY BALLOON DILATION RETROGRADE URETEROGRAM ;  Surgeon: Martina Sinner, MD;  Location: [facility] [place];  Service: Urology;  Laterality: N/A;   CYTSO/  DILATATION URETHRAL STRICTURE/  BX PROSTATIC URETHRA/  REMOVAL FOREIGN BODIES  06-25-2010   [facility]   ECTROPION REPAIR Left 06/18/2019   lower eyelid   INGUINAL HERNIA REPAIR Right 2000   MOHS SURGERY  01/ 2019    [facility]   left lower leg for [facility]   MOHS SURGERY  12/25/2018   for [facility] of left lower eyelid;  the second stage Hughs flap release 01-09-2019   RADICAL RESECTION OF SARCOMA TUMOR   03-15-2017   [facility]   RIGHT LOWER LEG , CALF AREA   RADIOACTIVE SEED IMPLANTS, PROSTATE  JAN  2003   SKIN LESION EXCISION  07/2012   [facility]  right shoulder   TONSILLECTOMY Left 11-10-2011  @[facility]    tonsillar cancer   TRANSURETHRAL INCISION OF BLADDER NECK N/A 09/12/2019   Procedure: TRANSURETHRAL INCISION OF BLADDER NECK;  Surgeon: Bjorn Pippin, MD;  Location: [facility] [place];  Service: Urology;  Laterality: N/A;    Allergies  Allergen Reactions   Cefixime Rash   Contrast Media [Iodinated Contrast Media] Other (See Comments)    Avoid due to renal disease   Lisinopril Swelling    Swollen lower lip   Macrobid [Nitrofurantoin Macrocrystal] Swelling   Nsaids Other (See Comments)    Has been told no NSAIDs, Ibuprofen Renal insufficiency hx.    Cephalosporins Rash    Outpatient Encounter  Medications as of 04/04/2023  Medication Sig   amLODipine (NORVASC) 2.5 MG tablet Take 1 tablet (2.5 mg total) by mouth daily.   buPROPion (WELLBUTRIN) 100 MG tablet Take 1 tablet (100 mg total) by mouth 3 (three) times daily.   cyanocobalamin (VITAMIN B12) 1000 MCG/ML injection INJECT INTRAMUSCULARLY FOR 14 DAYS   Multiple Vitamins-Minerals (PRESERVISION  AREDS 2 PO) Take by mouth. daily   NP THYROID 120 MG tablet Take 120 mg by mouth. 6 days a week   SYRINGE-NEEDLE, DISP, 3 ML (B-D 3CC LUER-LOK SYR 25GX5/8") 25G X 5/8" 3 ML MISC USE FOR INJECTION EVERY 14 DAYS   temazepam (RESTORIL) 7.5 MG capsule Take 7.5 mg by mouth at bedtime as needed for sleep.   Ascorbic Acid (VITAMIN C PO) Take by mouth. daily (Patient not taking: Reported on 04/04/2023)   Cholecalciferol (VITAMIN D) 50 MCG (2000 UT) CAPS Take by mouth every other day. (Patient not taking: Reported on 04/04/2023)   ketoconazole (NIZORAL) 2 % cream Apply 1 Application topically daily. (Patient not taking: Reported on 04/04/2023)   No facility-administered encounter medications on file as of 04/04/2023.    Review of Systems:  Review of Systems  Constitutional:  Negative for activity change, appetite change and unexpected weight change.  HENT: Negative.    Respiratory:  Negative for cough and shortness of breath.   Cardiovascular:  Negative for leg swelling.  Gastrointestinal:  Negative for constipation.  Genitourinary:  Negative for frequency.  Musculoskeletal:  Negative for arthralgias, gait problem and myalgias.  Skin: Negative.  Negative for rash.  Neurological:  Positive for dizziness. Negative for weakness.  Psychiatric/Behavioral:  Positive for confusion. Negative for sleep disturbance.   All other systems reviewed and are negative.   Health Maintenance  Topic Date Due   Medicare Annual Wellness (AWV)  11/13/2020   COVID-19 Vaccine (8 - Moderna risk 2024-25 season) 04/19/2023   Pneumonia Vaccine 69+ Years old  Completed    HPV VACCINES  Aged Out   DTaP/Tdap/Td  Discontinued   INFLUENZA VACCINE  Discontinued   Zoster Vaccines- Shingrix  Discontinued    Physical Exam: Vitals:   04/04/23 0829  BP: (!) 162/98  Pulse: 70  Resp: 19  Temp: 98 F (36.7 C)  SpO2: 98%  Weight: 159 lb (72.1 kg)  Height: 5\' 11"  (1.803 m)   Body mass index is 22.18 kg/m. Physical Exam Vitals reviewed.  Constitutional:      Appearance: Normal appearance.  HENT:     Head: Normocephalic.     Nose: Nose normal.     Mouth/Throat:     Mouth: Mucous membranes are moist.     Pharynx: Oropharynx is clear.  Eyes:     Pupils: Pupils are equal, round, and reactive to light.  Cardiovascular:     Rate and Rhythm: Normal rate and regular rhythm.     Pulses: Normal pulses.     Heart sounds: No murmur heard. Pulmonary:     Effort: Pulmonary effort is normal. No respiratory distress.     Breath sounds: Normal breath sounds. No rales.  Abdominal:     General: Abdomen is flat. Bowel sounds are normal.     Palpations: Abdomen is soft.  Musculoskeletal:        General: No swelling.     Cervical back: Neck supple.     Comments: Left Leg Chronic Swelling with Chronic Venous changes Right leg also had some Papules where he was itching  Skin:    General: Skin is warm.  Neurological:     General: No focal deficit present.     Mental Status: He is alert and oriented to person, place, and time.  Psychiatric:        Mood and Affect: Mood normal.        Thought Content: Thought content normal.     Labs reviewed: Basic  Metabolic Panel: Recent Labs    05/13/22 1502 09/29/22 0000 02/04/23 2105 02/04/23 2128 03/21/23 0000  NA 137 141 138 138 138  K 5.2 4.6 4.3 4.4 4.4  CL 101 103 103 105 100  CO2 29 28* 24  --  27*  GLUCOSE 106*  --  102* 99  --   BUN 38* 28* 35* 34* 32*  CREATININE 1.72* 1.5* 1.86* 2.00* 1.4*  CALCIUM 8.3* 9.0 8.9  --  9.1  MG  --   --  2.2  --   --    Liver Function Tests: Recent Labs     09/29/22 0000 02/04/23 2105 03/21/23 0000  AST 15 15 19   ALT 12 15 20   ALKPHOS 76 54 76  BILITOT  --  0.9  --   PROT  --  6.3*  --   ALBUMIN 3.9 3.5 3.9   No results for input(s): "LIPASE", "AMYLASE" in the last 8760 hours. No results for input(s): "AMMONIA" in the last 8760 hours. CBC: Recent Labs    05/13/22 1502 09/29/22 0000 02/04/23 2105 02/04/23 2128 03/21/23 0000  WBC 7.4 5.5 6.0  --  4.8  NEUTROABS 5,321  --   --   --   --   HGB 11.8* 13.2* 12.7* 12.6* 12.3*  HCT 34.7* 39* 37.4* 37.0* 37*  MCV 93.3  --  94.9  --   --   PLT 280 221 158  --  209   Lipid Panel: Recent Labs    09/29/22 0000 03/21/23 0000  CHOL 231* 201*  HDL 62 72*  LDLCALC 152 113  TRIG 84 78   No results found for: "HGBA1C"  Procedures since last visit: No results found.  Assessment/Plan  Assessment and Plan    Hypertension Uncontrolled hypertension due to missed amlodipine dose. Risk of aneurysm rupture emphasized. - Ensure amlodipine prescription is picked up and taken daily. - Record blood pressure readings regularly. - Bring blood pressure machine for calibration next appointment.  Angioedema secondary to lisinopril Angioedema due to lisinopril, which has been discontinued.  Memory decline Cognitive impairment noticed during the Visit with him struggling with words Also talked to facility nurse to follow with him and His Med management Reports difficulty recalling words, indicating memory decline. - Conduct memory testing at the next appointment.  Depression Depression managed with Wellbutrin per Dr Donell Beers and therapy, with symptom improvement.  Macular degeneration Macular degeneration with slight improvement in right eye, worsening left eye. He is not driving any more And getting more depressed Constipation Constipation managed with intermittent Miralax use. - Take Miralax PRN  to manage constipation.  Skin condition Skin condition with small bumps on legs, advised  to use Trimethanol steroid cream. - Apply Triamcinolone  steroid cream as prescribed. HLD Not interested in Zetia/statin  Hypothyroidism Follows with Dr Talmage Nap  Dizziness Feels it in the morning Chronic Issue B12 deficiency Self Injects  CKD Sees Nephrology Insomnia Not sure if taking Restoril PRN  Follow-up Uses CPAP for Sleep Apnea   Follow-up required to monitor blood pressure, memory, and overall health. - Schedule follow-up appointment in four weeks. - Ensure completion of tax-related tasks before next appointment.        Labs/tests ordered:  * No order type specified * Next appt:  05/02/2023

## 2023-04-06 ENCOUNTER — Ambulatory Visit: Payer: Self-pay

## 2023-04-06 DIAGNOSIS — N183 Chronic kidney disease, stage 3 unspecified: Secondary | ICD-10-CM

## 2023-04-06 MED ORDER — AMLODIPINE BESYLATE 2.5 MG PO TABS: 2.5000 mg | ORAL_TABLET | Freq: Two times a day (BID) | ORAL | Status: DC

## 2023-04-06 NOTE — Telephone Encounter (Signed)
 I called patient, no answer. I left a detailed message informing him that I've added him to the [facility] schedule for next Tuesday at 9 am, he can call if the date and time conflicts with something he had already planned.

## 2023-04-06 NOTE — Telephone Encounter (Signed)
 I called the [facility] nurse and they checked on him I have changed his Norvasc to 2.5 mg BID as his Morning BP are running high Please add him to my Tues Schedule

## 2023-04-06 NOTE — Telephone Encounter (Signed)
 Chief Complaint: Elevated Blood Pressure  Symptoms: BP-190/110 Tuesday, 168/100 this morning, 138/93 now, dizzy Frequency: Comes and goes  Pertinent Negatives: Patient denies nausea, vomiting, numbness, weakness Disposition: [] ED /[] Urgent Care (no appt availability in office) / [] Appointment(In office/virtual)/ []  [facility]/ [] Home Care/ [] Refused Recommended Disposition /[] [place] [facility]/ [x]  Follow-up with PCP Additional Notes: Patient states he has been checking  his blood pressure in the morning before taking his medication and it has been elevated. This Rn requested the patient to check BP while on the call and the reading was 138/93. Patient states he felt a little dizzy this morning. Patient states he see PCP at the [facility] home he lives at and wanted to know if she would like to see him sooner. Care advice was given and patient stated he just wants Dr. Chales Abrahams to know his readings and that he missed 1 dose of blood pressure medicine today.   Copied from CRM [id number]. Topic: Clinical - Red Word Triage >> Apr 06, 2023 11:54 AM Maree Krabbe H wrote: [facility] that prompted transfer to Nurse Triage: Patient called and stated that his blood pressure has been high for a few days, he checked it and it was 190/110, patient has been a little dizzy. Reason for Disposition  Systolic BP  >= 160 OR Diastolic >= 100  Answer Assessment - Initial Assessment Questions 1. BLOOD PRESSURE: "What is the blood pressure?" "Did you take at least two measurements 5 minutes apart?"     190/110 Tuesday morning, 168/100 this morning, 138/93 now  2. ONSET: "When did you take your blood pressure?"     Tuesday  3. HOW: "How did you take your blood pressure?" (e.g., automatic home BP monitor, visiting nurse)     Automatic home BP monitor  4. HISTORY: "Do you have a history of high blood pressure?"     Yes 5. MEDICINES: "Are you taking any medicines for blood pressure?" "Have you missed any doses  recently?"     Yes, I have not missed any doses of my medicine  6. OTHER SYMPTOMS: "Do you have any symptoms?" (e.g., blurred vision, chest pain, difficulty breathing, headache, weakness)     Dizzy, mild headache  Protocols used: Blood Pressure - High-A-AH

## 2023-04-06 NOTE — Addendum Note (Signed)
 Addended by: Cephus Richer on: 04/06/2023 03:24 PM   Modules accepted: Orders

## 2023-04-11 ENCOUNTER — Telehealth: Payer: Self-pay | Admitting: Internal Medicine

## 2023-04-11 MED ORDER — AMLODIPINE BESYLATE 2.5 MG PO TABS: 2.5000 mg | ORAL_TABLET | Freq: Every day | ORAL | Status: DC

## 2023-04-11 NOTE — Telephone Encounter (Signed)
 Patient added to scheduled as instructed

## 2023-04-11 NOTE — Telephone Encounter (Signed)
 Patients BP still running high later in the morning. Changed his Amlodipine to 5 mg in the morning and 2.5 mg in PM Please add him to my schedule at 2 pm on 04/18/2023 Patient is aware

## 2023-04-17 ENCOUNTER — Other Ambulatory Visit: Payer: Self-pay

## 2023-04-17 NOTE — Telephone Encounter (Signed)
 Patient pharmacy has faxed over a request for clarification on medication Amlodipine. They stated patient is taking medication 3 daily and prescription needs to match the directions they have on file. Medication pend and sent to PCP Mahlon Gammon, MD for approval.

## 2023-04-18 ENCOUNTER — Ambulatory Visit: Admitting: Internal Medicine

## 2023-04-18 ENCOUNTER — Other Ambulatory Visit: Payer: Self-pay | Admitting: Internal Medicine

## 2023-04-18 ENCOUNTER — Encounter: Payer: Self-pay | Admitting: Internal Medicine

## 2023-04-18 VITALS — BP 124/80 | HR 61 | Temp 97.7°F | Resp 18 | Ht 71.0 in | Wt 154.4 lb

## 2023-04-18 DIAGNOSIS — F339 Major depressive disorder, recurrent, unspecified: Secondary | ICD-10-CM | POA: Diagnosis not present

## 2023-04-18 DIAGNOSIS — E034 Atrophy of thyroid (acquired): Secondary | ICD-10-CM

## 2023-04-18 DIAGNOSIS — I129 Hypertensive chronic kidney disease with stage 1 through stage 4 chronic kidney disease, or unspecified chronic kidney disease: Secondary | ICD-10-CM

## 2023-04-18 DIAGNOSIS — F3341 Major depressive disorder, recurrent, in partial remission: Secondary | ICD-10-CM

## 2023-04-18 DIAGNOSIS — F5101 Primary insomnia: Secondary | ICD-10-CM | POA: Diagnosis not present

## 2023-04-18 DIAGNOSIS — N183 Chronic kidney disease, stage 3 unspecified: Secondary | ICD-10-CM

## 2023-04-18 MED ORDER — AMLODIPINE BESYLATE 5 MG PO TABS: 5.0000 mg | ORAL_TABLET | Freq: Two times a day (BID) | ORAL | 3 refills | Status: DC

## 2023-04-19 ENCOUNTER — Other Ambulatory Visit: Payer: Self-pay | Admitting: Internal Medicine

## 2023-04-19 DIAGNOSIS — F3341 Major depressive disorder, recurrent, in partial remission: Secondary | ICD-10-CM

## 2023-04-19 NOTE — Telephone Encounter (Signed)
 Medication refilled by PCP Mahlon Gammon, MD yesterday 04/18/2023 and confirmed.

## 2023-04-21 NOTE — Progress Notes (Signed)
 Location:  [facility]   Place of Service:   Clinic  Provider:   Code Status:  Goals of Care:     04/04/2023   10:32 AM  Advanced Directives  Does Patient Have a Medical Advance Directive? Yes  Type of Estate agent of [place];Living will;Out of facility DNR (pink MOST or yellow form)  Does patient want to make changes to medical advance directive? No - Patient declined  Copy of Healthcare Power of Attorney in Chart? Yes - validated most recent copy scanned in chart (See row information)     Chief Complaint  Patient presents with   Medical Management of Chronic Issues    Blood pressure follow up. Patient has concerns about lumps on his legs. Discuss the need for AWV.     HPI: Patient is a 89 y.o. male seen today for an acute visit for Hypertension  [place] in [place]   Recently had adverse reaction to Lisinopril with Lip swelling Lisinopril changed to Norvasc  Discussed the use of AI scribe software for clinical note transcription with the patient, who gave verbal consent to proceed.  History of Present Illness   The patient, with a history of hypertension, lymphedema, and dizziness, presents with persistently high blood pressure despite taking amlodipine 5mg  in the morning and 2.5mg  in the evening. He reports that his blood pressure is "good during the day," but upon waking, it is around 170/90 mmHg. The patient's blood pressure decreases slightly to 160/90 mmHg upon sitting up and returns to 170/90 mmHg upon standing.   The patient denies any worsening of his lymphedema or dizziness since starting amlodipine.  The patient also reports intermittent insomnia, waking up between 2-4am, but does not feel fully awake. He had previously stopped taking temazepam as it did not seem to make a difference, but plans to restart it.   The patient also reports a history of depression, which he describes as "slightly depressed," but he is not particularly worried  about it.   He has stopped taking most of his medications, including vitamin D, but continues to take medications for his kidneys and eyes.      Other History   Also Has Past medical  history of    sarcoma of Right lower extremity Follows closely with oncology at [facility] for CT scan of his chest for lung nodules. Has been in remission. Recent Imaging has not shown any issues   History of prostate cancer. History of tonsillar cancer Macular degeneration follows with ophthalmologist   Aortitis syndrome.  Follows with Dr. Kathi Ludwig who sometimes has to use prednisone as needed   B12 deficiency takes injection self administer   Hypothyroid follows with Dr. Talmage Nap    CKD follows with Renal   Sleep Apnea CPAP Past Medical History:  Diagnosis Date   Age-related macular degeneration, dry, both eyes    Aortitis syndrome (HCC) rheumotologist-  dr syed   IgG4 syndrome--  (effects abdomine) ,  previously long term use prednisone last taken 07/ 2021   Basal cell carcinoma (BCC) of eye    CKD (chronic kidney disease), stage III [facility])    nephrologist-- dr Bea Laura. Signe Colt   Complication of anesthesia    post op acute urinary retention   DDD (degenerative disc disease)    Fatigue    pt evalulation by cardiology--- dr Mackey Birchwood, note 05-21-2019 in epic,(non cardiac)  normal echo and event monitor showed extra beats of atrial tachycardia , no atrial fib, and rare  ecotpy   Full dentures    GERD (gastroesophageal reflux disease)    History of basal cell carcinoma (BCC) excision    12-25-2018 MOH's reconstruction left lower eyelid   History of external beam radiation therapy 2002   prostate cancer  and boost with radioative prostate seed implants   History of prostate cancer DX  2002   S/P EXTERNAL RADIATION/ RADIOACTIVE SEED IMPLANTS  2003   History of squamous cell carcinoma excision    2012--  RIGHT LOWER EXTREM;  2019 LEFT LOWER EXTREMITIY   HTN (hypertension)    followed by pcp   Hyperlipidemia     Hypothyroidism    followed by pcp--- acquired atrophy due to radiation;  previously seen by endocrinologist-  dr Talmage Nap--- per pt takes his own thyroid med. called NP Thyroid 90 mg daily, does not take synthroid   Large vessel vasculitis (HCC)    rheumotologist-  dr syed --- IgG4 related aoritis involving renal arteries ,  pt treated with predisone.  (09-06-2019  per pt no longer prednisone last taken one month ago 07/ 2021, weaned off by pcp per pt request)   Left renal artery stenosis [facility])    nephrologist--- dr Signe Colt--- with aneurysm proximal    Lymphedema of left leg    followed by [facility]---- pt stated uses compression hose   OSA (obstructive sleep apnea) 09-06-2019  per pt lasted used cpap approx. 2019, stated felt he no longer needed    pt retested 11-13-2016 at [facility] w/ dr ather-- moderate to severe OSA , AHI 16.4/h/  03-24-2017    Pulmonary nodule followed by [facility]   per CT 06-11-2019  in Care Everywhere in epic done at [facility],  LUL nodule , not mets   Renal cyst, right    Restless legs syndrome (RLS)    S/P radiation therapy     for tonsillar cancer (head and neck) completed 01-26-2012 at [facility]);   and XRT for right lower leg sarcoma --- completed 06-06-2017   Saliva decreased    Sarcoma of lower extremity, right (HCC) oncolgoist-  dr Genella Mech ([facility]) & Dr Leane Platt ([facility])   dx 02-14-2017 w/ needle core bx;  high grade pleomorphic spindle cell sarcoma , grade 2 (cT2N0M0); 03-15-2017  radical resection sarcoma tumor right lower leg  and completed radiation 06-06-2017   Self-catheterizes urinary bladder    QID   AND   PRN   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+) (Stage IVA)---- dx oct 2013  ----s/p left tonsillectomy and concurent chemo and radiation/  ended 01-26-2012-- no surgical intervention---  residuals ( dry mouth, decreased saliva)   oncologist at [facility]--  dr brizel--   HX OF -- NO RECURRENCE   Urethral false passage 07/2019   Urethral stricture urologist--- dr Annabell Howells   chronic---  post urethral dilation's   Urinary retention with incomplete bladder emptying    Vitamin B 12 deficiency 01/28/2011   Vitamin D deficiency     Past Surgical History:  Procedure Laterality Date   BALLOON DILATION N/A 11/10/2014   Procedure: CYSTO BALLOON DILATION AND RETROGRADE URETHROGRAM ;  Surgeon: Alfredo Martinez, MD;  Location: [facility];  Service: Urology;  Laterality: N/A;   CATARACT EXTRACTION W/ INTRAOCULAR LENS  IMPLANT, BILATERAL  2012  approx   CYSTO/ BALLOON DILATION OF  URETHRAL STRICTURE  12-25-2010   CYSTOSCOPY WITH RETROGRADE URETHROGRAM N/A 03/30/2016   Procedure:  CYSTOSCOPY WITH RETROGRADE URETHROGRAM AND BALLOON DILATION with cystogram;  Surgeon: Alfredo Martinez, MD;  Location: [facility];  Service: Urology;  Laterality: N/A;   CYSTOSCOPY WITH URETHRAL DILATATION  05/31/2011   Procedure: CYSTOSCOPY WITH URETHRAL DILATATION;  Surgeon: Martina Sinner, MD;  Location: [facility];  Service: Urology;  Laterality: N/A;  BALLOON DILATION   CYSTOSCOPY WITH URETHRAL DILATATION N/A 09/13/2016   Procedure: CYSTOSCOPY WITH URETHRAL BALLOON DILATATION;  Surgeon: Alfredo Martinez, MD;  Location: [facility];  Service: Urology;  Laterality: N/A;   CYSTOSCOPY WITH URETHRAL DILATATION N/A 03/30/2017   Procedure: CYSTOSCOPY WITH BALLOON URETHRAL DILATATION;  Surgeon: Alfredo Martinez, MD;  Location: [facility];  Service: Urology;  Laterality: N/A;   CYSTOSCOPY WITH URETHRAL DILATATION N/A 09/12/2019   Procedure: CYSTOSCOPY WITH URETHRAL DILATATION;  Surgeon: Bjorn Pippin, MD;  Location: [facility];  Service: Urology;  Laterality: N/A;   CYSTOSCOPY/RETROGRADE/URETEROSCOPY N/A 05/22/2012   Procedure: CYSTOSCOPY BALLOON DILATION RETROGRADE URETEROGRAM ;  Surgeon: Martina Sinner, MD;   Location: [facility] [place];  Service: Urology;  Laterality: N/A;   CYTSO/  DILATATION URETHRAL STRICTURE/  BX PROSTATIC URETHRA/  REMOVAL FOREIGN BODIES  06-25-2010   [facility]   ECTROPION REPAIR Left 06/18/2019   lower eyelid   INGUINAL HERNIA REPAIR Right 2000   MOHS SURGERY  01/ 2019    [facility]   left lower leg for [facility]   MOHS SURGERY  12/25/2018   for [facility] of left lower eyelid;  the second stage Hughs flap release 01-09-2019   RADICAL RESECTION OF SARCOMA TUMOR   03-15-2017   [facility]   RIGHT LOWER LEG , CALF AREA   RADIOACTIVE SEED IMPLANTS, PROSTATE  JAN  2003   SKIN LESION EXCISION  07/2012   MOST  right shoulder   TONSILLECTOMY Left 11-10-2011  @[facility]    tonsillar cancer   TRANSURETHRAL INCISION OF BLADDER NECK N/A 09/12/2019   Procedure: TRANSURETHRAL INCISION OF BLADDER NECK;  Surgeon: Bjorn Pippin, MD;  Location: [facility] [place];  Service: Urology;  Laterality: N/A;    Allergies  Allergen Reactions   Cefixime Rash   Contrast Media [Iodinated Contrast Media] Other (See Comments)    Avoid due to renal disease   Lisinopril Swelling    Swollen lower lip   Macrobid [Nitrofurantoin Macrocrystal] Swelling   Nsaids Other (See Comments)    Has been told no NSAIDs, Ibuprofen Renal insufficiency hx.    Cephalosporins Rash    Outpatient Encounter Medications as of 04/18/2023  Medication Sig   amLODipine (NORVASC) 5 MG tablet Take 1 tablet (5 mg total) by mouth 2 (two) times daily.   Cholecalciferol (VITAMIN D) 50 MCG (2000 UT) CAPS Take by mouth every other day.   cyanocobalamin (VITAMIN B12) 1000 MCG/ML injection INJECT INTRAMUSCULARLY FOR 14 DAYS   Multiple Vitamins-Minerals (PRESERVISION AREDS 2 PO) Take by mouth. daily   NP THYROID 120 MG tablet Take 120 mg by mouth. 6 days a week   SYRINGE-NEEDLE, DISP, 3 ML (B-D 3CC LUER-LOK SYR 25GX5/8") 25G X 5/8" 3 ML MISC USE FOR INJECTION EVERY 14 DAYS   temazepam (RESTORIL) 7.5 MG capsule Take 7.5 mg by mouth at bedtime as  needed for sleep.   [DISCONTINUED] amLODipine (NORVASC) 2.5 MG tablet Take 1 tablet (2.5 mg total) by mouth daily. Take 5 mg in AM and 2.5 mg in PM (Patient taking differently: Take 5 mg by mouth in the morning and at bedtime.)   [  DISCONTINUED] buPROPion (WELLBUTRIN) 100 MG tablet Take 1 tablet (100 mg total) by mouth 3 (three) times daily.   ketoconazole (NIZORAL) 2 % cream Apply 1 Application topically daily. (Patient not taking: Reported on 03/02/2023)   [DISCONTINUED] Ascorbic Acid (VITAMIN C PO) Take by mouth. daily (Patient not taking: Reported on 03/02/2023)   No facility-administered encounter medications on file as of 04/18/2023.    Review of Systems:  Review of Systems  Constitutional:  Negative for activity change, appetite change and unexpected weight change.  HENT: Negative.    Respiratory:  Negative for cough and shortness of breath.   Cardiovascular:  Negative for leg swelling.  Gastrointestinal:  Positive for constipation.  Genitourinary:  Negative for frequency.  Musculoskeletal:  Negative for arthralgias, gait problem and myalgias.  Skin: Negative.  Negative for rash.  Neurological:  Positive for dizziness. Negative for weakness.  Psychiatric/Behavioral:  Positive for sleep disturbance. Negative for confusion.   All other systems reviewed and are negative.   Health Maintenance  Topic Date Due   Medicare Annual Wellness (AWV)  11/13/2020   COVID-19 Vaccine (8 - Moderna risk 2024-25 season) 04/19/2023   Pneumonia Vaccine 36+ Years old  Completed   HPV VACCINES  Aged Out   DTaP/Tdap/Td  Discontinued   INFLUENZA VACCINE  Discontinued   Zoster Vaccines- Shingrix  Discontinued    Physical Exam: Vitals:   04/18/23 1143  BP: 124/80  Pulse: 61  Resp: 18  Temp: 97.7 F (36.5 C)  SpO2: 98%  Weight: 154 lb 6.4 oz (70 kg)  Height: 5\' 11"  (1.803 m)   Body mass index is 21.53 kg/m. Physical Exam Vitals reviewed.  Constitutional:      Appearance: Normal appearance.   HENT:     Head: Normocephalic.     Nose: Nose normal.     Mouth/Throat:     Mouth: Mucous membranes are moist.     Pharynx: Oropharynx is clear.  Eyes:     Pupils: Pupils are equal, round, and reactive to light.  Cardiovascular:     Rate and Rhythm: Normal rate and regular rhythm.     Pulses: Normal pulses.     Heart sounds: No murmur heard. Pulmonary:     Effort: Pulmonary effort is normal. No respiratory distress.     Breath sounds: Normal breath sounds. No rales.  Abdominal:     General: Abdomen is flat. Bowel sounds are normal.     Palpations: Abdomen is soft.  Musculoskeletal:        General: Swelling present.     Cervical back: Neck supple.  Skin:    General: Skin is warm.  Neurological:     General: No focal deficit present.     Mental Status: He is alert and oriented to person, place, and time.  Psychiatric:        Mood and Affect: Mood normal.        Thought Content: Thought content normal.     Labs reviewed: Basic Metabolic Panel: Recent Labs    05/13/22 1502 09/29/22 0000 02/04/23 2105 02/04/23 2128 03/21/23 0000  NA 137 141 138 138 138  K 5.2 4.6 4.3 4.4 4.4  CL 101 103 103 105 100  CO2 29 28* 24  --  27*  GLUCOSE 106*  --  102* 99  --   BUN 38* 28* 35* 34* 32*  CREATININE 1.72* 1.5* 1.86* 2.00* 1.4*  CALCIUM 8.3* 9.0 8.9  --  9.1  MG  --   --  2.2  --   --    Liver Function Tests: Recent Labs    09/29/22 0000 02/04/23 2105 03/21/23 0000  AST 15 15 19   ALT 12 15 20   ALKPHOS 76 54 76  BILITOT  --  0.9  --   PROT  --  6.3*  --   ALBUMIN 3.9 3.5 3.9   No results for input(s): "LIPASE", "AMYLASE" in the last 8760 hours. No results for input(s): "AMMONIA" in the last 8760 hours. CBC: Recent Labs    05/13/22 1502 09/29/22 0000 02/04/23 2105 02/04/23 2128 03/21/23 0000  WBC 7.4 5.5 6.0  --  4.8  NEUTROABS 5,321  --   --   --   --   HGB 11.8* 13.2* 12.7* 12.6* 12.3*  HCT 34.7* 39* 37.4* 37.0* 37*  MCV 93.3  --  94.9  --   --   PLT  280 221 158  --  209   Lipid Panel: Recent Labs    09/29/22 0000 03/21/23 0000  CHOL 231* 201*  HDL 62 72*  LDLCALC 152 113  TRIG 84 78   No results found for: "HGBA1C"  Procedures since last visit: No results found.  Assessment/Plan 1. Benign hypertension with chronic kidney disease, stage III (HCC) (Primary) Change Norvasc to 5 mg BID Splitting the dose as he has h/o Dizziness He will Continue to monitor his BP 2. Major depression, recurrent, chronic (HCC) Sees Dr Donell Beers office On Wellbutrin 3/day  3. Hypothyroidism due to acquired atrophy of thyroid Follows with Dr Talmage Nap  4. Primary insomnia Restart Restoril PRn    Labs/tests ordered:  * No order type specified * Next appt:  04/18/2023

## 2023-05-02 ENCOUNTER — Encounter: Admitting: Internal Medicine

## 2023-05-07 NOTE — Progress Notes (Signed)
 Cardiology Office Note    Patient Name: Philip [name] Date of Encounter: 05/07/2023  Primary Care Provider:  Marguerite Shiley, MD Primary Cardiologist:  Philip Phy, MD Primary Electrophysiologist: None   Past Medical History    Past Medical History:  Diagnosis Date   Age-related macular degeneration, dry, both eyes    Aortitis syndrome (HCC) rheumotologist-  Philip [name]   IgG4 syndrome--  (effects abdomine) ,  previously long term use prednisone  last taken 07/ 2021   Basal cell carcinoma (BCC) of eye    CKD (chronic kidney disease), stage III [name])    nephrologist-- Philip [name]   Complication of anesthesia    post op acute urinary retention   DDD (degenerative disc disease)    Fatigue    pt evalulation by cardiology--- Philip Philip [name], note 05-21-2019 in epic,(non cardiac)  normal echo and event monitor showed extra beats of atrial tachycardia , no atrial fib, and rare ecotpy   Full dentures    GERD (gastroesophageal reflux disease)    History of basal cell carcinoma (BCC) excision    12-25-2018 MOH's reconstruction left lower eyelid   History of external beam radiation therapy 2002   prostate cancer  and boost with radioative prostate seed implants   History of prostate cancer DX  2002   S/P EXTERNAL RADIATION/ RADIOACTIVE SEED IMPLANTS  2003   History of squamous cell carcinoma excision    2012--  RIGHT LOWER EXTREM;  2019 LEFT LOWER EXTREMITIY   HTN (hypertension)    followed by pcp   Hyperlipidemia    Hypothyroidism    followed by pcp--- acquired atrophy due to radiation;  previously seen by endocrinologist-  Philip [name]--- per pt takes his own thyroid  med. called NP Thyroid  90 mg daily, does not take synthroid    Large vessel vasculitis (HCC)    rheumotologist-  Philip [name] --- IgG4 related aoritis involving renal arteries ,  pt treated with predisone.  (09-06-2019  per pt no longer prednisone  last taken one month ago 07/ 2021, weaned off by pcp per pt request)   Left renal  artery stenosis [facility])    nephrologist--- Philip Philip [name]--- with aneurysm proximal    Lymphedema of left leg    followed by [name]---- pt stated uses compression hose   OSA (obstructive sleep apnea) 09-06-2019  per pt lasted used cpap approx. 2019, stated felt he no longer needed    pt retested 11-13-2016 at [facility] w/ Philip [name]-- moderate to severe OSA , AHI 16.4/h/  03-24-2017    Pulmonary nodule followed by Philip [name]   per CT 06-11-2019  in Care Everywhere in epic done at [facility],  LUL nodule , not mets   Renal cyst, right    Restless legs syndrome (RLS)    S/P radiation therapy     for tonsillar cancer (head and neck) completed 01-26-2012 at [name]);   and XRT for right lower leg sarcoma --- completed 06-06-2017   Saliva decreased    Sarcoma of lower extremity, right (HCC) oncolgoist-  Philip Philip [name] ([facility]) & Philip Philip [name] ([facility])   dx 02-14-2017 w/ needle core bx;  high grade pleomorphic spindle cell sarcoma , grade 2 (cT2N0M0); 03-15-2017  radical resection sarcoma tumor right lower leg  and completed radiation 06-06-2017   Self-catheterizes urinary bladder    QID   AND   PRN   Tonsillar cancer (HCC) 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 (cT2  N2b) (p16+) (Stage IVA)---- dx oct 2013  ----s/p left tonsillectomy and concurent chemo and radiation/  ended 01-26-2012-- no surgical intervention---  residuals ( dry mouth, decreased saliva)   oncologist at [facility]--  Philip brizel--  HX OF -- NO RECURRENCE   Urethral false passage 07/2019   Urethral stricture urologist--- Philip Philip [name]   chronic---  post urethral dilation's   Urinary retention with incomplete bladder emptying    Vitamin B 12 deficiency 01/28/2011   Vitamin D  deficiency     History of Present Illness  Philip [name] is a 89 y.o. male with a PMH of HTN, CKD stage IIIb, PTSD, OSA, aortitis IgG4 syndrome, aortic atherosclerosis, ascending aortic aneurysm, prostate CA sarcoma of right lower  extremity s/p surgery and radiation who presents today for 73-month follow-up.  Philip [name] was followed initially by Philip. Rolm [name] and is currently followed by Philip. Lorie [name] for management of CAD.  He was initially seen for complaint of fatigue 2D echo completed showing normal EF and no evidence of valve disease.  He was seen in follow-up on 03/30/2022 with ongoing complaint of fatigue that is worse sometimes with exertion.  He underwent nuclear PET stress test that showed normal perfusion with no evidence of ischemia coronary calcium present that was moderate in the left anterior descending artery and RCA reduced EF.  He underwent a 2D echo for further evaluation that showed normal EF of 60 to 65% with mild concentric LVH no RWMA with mild to moderate dilated RA and moderate dilation of 49 mm.  He had a CTA of the aorta that showed 4.5 cm ascending thoracic aneurysm under control and avoid lifting less than 25 pounds.  He was last seen by Philip. Lorie [name] on 02/08/2023 and he was noted to [facility].  He also noted occasional dizziness from sitting to standing quickly.  BP during visit was 152/101 60/90 on recheck.  Lisinopril  was increased to 20 mg daily ongoing palpitations and wore event monitor that showed 1 run of AT lasting 4 beats with 49 episodes of SVT and some episodes of atrial tach second-degree AV block noted.  He was offered Toprol-XL 25 mg if symptomatic however patient declined and noted being asymptomatic while wearing the monitor.    Patient denies chest pain, palpitations, dyspnea, PND, orthopnea, nausea, vomiting, dizziness, syncope, edema, weight gain, or early satiety.   Discussed the use of AI scribe software for clinical note transcription with the patient, who gave verbal consent to proceed.  History of Present Illness    ***Notes: -Last ischemic evaluation:  Review of Systems  Please see the history of present illness.    All other systems reviewed and are  otherwise negative except as noted above.  Physical Exam    Wt Readings from Last 3 Encounters:  04/18/23 154 lb 6.4 oz (70 kg)  04/04/23 159 lb (72.1 kg)  03/02/23 152 lb 12.8 oz (69.3 kg)   ZO:[id number] were no vitals filed for this visit.,There is no height or weight on file to calculate BMI. GEN: Well nourished, well developed in no acute distress Neck: No JVD; No carotid bruits Pulmonary: Clear to auscultation without rales, wheezing or rhonchi  Cardiovascular: Normal rate. Regular rhythm. Normal S1. Normal S2.   Murmurs: There is no murmur.  ABDOMEN: Soft, non-tender, non-distended EXTREMITIES:  No edema; No deformity   EKG/LABS/ Recent Cardiac Studies   ECG personally reviewed by me today - ***  Risk Assessment/Calculations:   {Does this patient have  ATRIAL FIBRILLATION?:[phone]}      Lab Results  Component Value Date   WBC 4.8 03/21/2023   HGB 12.3 (A) 03/21/2023   HCT 37 (A) 03/21/2023   MCV 94.9 02/04/2023   PLT 209 03/21/2023   Lab Results  Component Value Date   CREATININE 1.4 (A) 03/21/2023   BUN 32 (A) 03/21/2023   NA 138 03/21/2023   K 4.4 03/21/2023   CL 100 03/21/2023   CO2 27 (A) 03/21/2023   Lab Results  Component Value Date   CHOL 201 (A) 03/21/2023   HDL 72 (A) 03/21/2023   LDLCALC 113 03/21/2023   TRIG 78 03/21/2023    No results found for: "HGBA1C" Assessment & Plan    1.  Essential HTN:  2.  Coronary calcifications:  3.  Ascending aortic aneurysm:  4.  Chronic fatigue:  5.  Palpitations:      Disposition: Follow-up with Arun K Thukkani, MD or APP in *** months {Are you ordering a CV Procedure (e.g. stress test, cath, DCCV, TEE, etc)?   Press F2        :[id number]}   Signed, Francene Ing, Retha Cast, NP 05/07/2023, 2:44 PM [facility]

## 2023-05-08 ENCOUNTER — Encounter: Payer: Self-pay | Admitting: Nurse Practitioner

## 2023-05-08 ENCOUNTER — Ambulatory Visit: Payer: Medicare Other | Attending: Nurse Practitioner | Admitting: Nurse Practitioner

## 2023-05-08 VITALS — BP 124/76 | HR 65 | Ht 71.0 in | Wt 159.0 lb

## 2023-05-08 DIAGNOSIS — R002 Palpitations: Secondary | ICD-10-CM | POA: Insufficient documentation

## 2023-05-08 DIAGNOSIS — I7121 Aneurysm of the ascending aorta, without rupture: Secondary | ICD-10-CM | POA: Insufficient documentation

## 2023-05-08 DIAGNOSIS — I10 Essential (primary) hypertension: Secondary | ICD-10-CM | POA: Diagnosis present

## 2023-05-08 DIAGNOSIS — I2510 Atherosclerotic heart disease of native coronary artery without angina pectoris: Secondary | ICD-10-CM | POA: Diagnosis present

## 2023-05-08 DIAGNOSIS — R5382 Chronic fatigue, unspecified: Secondary | ICD-10-CM | POA: Insufficient documentation

## 2023-05-08 NOTE — Patient Instructions (Signed)
 Medication Instructions:  Your physician recommends that you continue on your current medications as directed. Please refer to the Current Medication list given to you today. *If you need a refill on your cardiac medications before your next appointment, please call your pharmacy*  Lab Work: None ordered If you have labs (blood work) drawn today and your tests are completely normal, you will receive your results only by: MyChart Message (if you have MyChart) OR A paper copy in the mail If you have any lab test that is abnormal or we need to change your treatment, we will call you to review the results.  Testing/Procedures: None ordered  Follow-Up: At [facility], you and your health needs are our priority.  As part of our continuing mission to provide you with exceptional heart care, our providers are all part of one team.  This team includes your primary Cardiologist (physician) and Advanced Practice Providers or APPs (Physician Assistants and Nurse Practitioners) who all work together to provide you with the care you need, when you need it.  Your next appointment:   4 month(s)  Provider:   Charles Connor, NP        We recommend signing up for the patient portal called "MyChart".  Sign up information is provided on this After Visit Summary.  MyChart is used to connect with patients for Virtual Visits (Telemedicine).  Patients are able to view lab/test results, encounter notes, upcoming appointments, etc.  Non-urgent messages can be sent to your provider as well.   To learn more about what you can do with MyChart, go to [email or web address].   Other Instructions STAY HYDRATED      1st Floor: - Lobby - Registration  - Pharmacy  - Lab - Cafe  2nd Floor: - PV Lab - Diagnostic Testing (echo, CT, nuclear med)  3rd Floor: - Vacant  4th Floor: - TCTS (cardiothoracic surgery) - AFib Clinic - Structural Heart Clinic - Vascular Surgery  - Vascular Ultrasound  5th  Floor: - [facility] (general and EP) - Clinical Pharmacy for coumadin, hypertension, lipid, weight-loss medications, and med management appointments    Valet parking services will be available as well.

## 2023-05-22 ENCOUNTER — Ambulatory Visit: Admitting: Adult Health

## 2023-05-22 ENCOUNTER — Encounter: Payer: Self-pay | Admitting: Adult Health

## 2023-05-22 VITALS — BP 116/78 | HR 65 | Temp 97.9°F | Resp 18 | Ht 71.0 in | Wt 157.6 lb

## 2023-05-22 DIAGNOSIS — R5383 Other fatigue: Secondary | ICD-10-CM

## 2023-05-22 DIAGNOSIS — N1831 Chronic kidney disease, stage 3a: Secondary | ICD-10-CM

## 2023-05-22 DIAGNOSIS — I10 Essential (primary) hypertension: Secondary | ICD-10-CM | POA: Diagnosis not present

## 2023-05-22 NOTE — Patient Instructions (Signed)
 You will need blood work prior to your next apt for follow up regarding HTN and fatigue

## 2023-05-22 NOTE — Progress Notes (Unsigned)
 Location:  [facility]  POS: Clinic  Provider: Raylene Calamity, ANP  Code Status: *** Goals of Care:     04/04/2023   10:32 AM  Advanced Directives  Does Patient Have a Medical Advance Directive? Yes  Type of Estate agent of [place];Living will;Out of facility DNR (pink MOST or yellow form)  Does patient want to make changes to medical advance directive? No - Patient declined  Copy of Healthcare Power of Attorney in Chart? Yes - validated most recent copy scanned in chart (See row information)     Chief Complaint  Patient presents with   Follow-up    1 month follow up. Patient has concerns about energy level and vision.     HPI: Patient is a 89 y.o. male seen today   Past Medical History:  Diagnosis Date   Age-related macular degeneration, dry, both eyes    Aortitis syndrome (HCC) rheumotologist-  dr syed   IgG4 syndrome--  (effects abdomine) ,  previously long term use prednisone  last taken 07/ 2021   Basal cell carcinoma (BCC) of eye    CKD (chronic kidney disease), stage III [facility]    nephrologist-- dr Zachery Hermes. Christianne Cowper   Complication of anesthesia    post op acute urinary retention   DDD (degenerative disc disease)    Fatigue    pt evalulation by cardiology--- dr Baldomero Levans, note 05-21-2019 in epic,(non cardiac)  normal echo and event monitor showed extra beats of atrial tachycardia , no atrial fib, and rare ecotpy   Full dentures    GERD (gastroesophageal reflux disease)    History of basal cell carcinoma (BCC) excision    12-25-2018 MOH's reconstruction left lower eyelid   History of external beam radiation therapy 2002   prostate cancer  and boost with radioative prostate seed implants   History of prostate cancer DX  2002   S/P EXTERNAL RADIATION/ RADIOACTIVE SEED IMPLANTS  2003   History of squamous cell carcinoma excision    2012--  RIGHT LOWER EXTREM;  2019 LEFT LOWER EXTREMITIY   HTN (hypertension)    followed by pcp   Hyperlipidemia     Hypothyroidism    followed by pcp--- acquired atrophy due to radiation;  previously seen by endocrinologist-  dr Ronelle Coffee--- per pt takes his own thyroid  med. called NP Thyroid  90 mg daily, does not take synthroid    Large vessel vasculitis (HCC)    rheumotologist-  dr syed --- IgG4 related aoritis involving renal arteries ,  pt treated with predisone.  (09-06-2019  per pt no longer prednisone  last taken one month ago 07/ 2021, weaned off by pcp per pt request)   Left renal artery stenosis [facility])    nephrologist--- dr Christianne Cowper--- with aneurysm proximal    Lymphedema of left leg    followed by [facility]---- pt stated uses compression hose   OSA (obstructive sleep apnea) 09-06-2019  per pt lasted used cpap approx. 2019, stated felt he no longer needed    pt retested 11-13-2016 at [facility] w/ dr ather-- moderate to severe OSA , AHI 16.4/h/  03-24-2017    Pulmonary nodule followed by [facility]   per CT 06-11-2019  in Care Everywhere in epic done at [facility],  LUL nodule , not mets   Renal cyst, right    Restless legs syndrome (RLS)    S/P radiation therapy     for tonsillar cancer (head and neck) completed 01-26-2012 at [facility] (70Gy);   and XRT for  right lower leg sarcoma --- completed 06-06-2017   Saliva decreased    Sarcoma of lower extremity, right (HCC) oncolgoist-  dr Sonya Duster ([facility]) & Dr Dyana Glade. 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+) (Stage IVA)---- dx oct 2013  ----s/p left tonsillectomy and concurent chemo and radiation/  ended 01-26-2012-- no surgical intervention---  residuals ( dry mouth, decreased saliva)   oncologist at [facility]--  dr brizel--   HX OF -- NO RECURRENCE   Urethral false passage 07/2019   Urethral stricture urologist--- dr Inga Manges   chronic---  post urethral dilation's   Urinary retention with incomplete bladder emptying    Vitamin B 12 deficiency 01/28/2011   Vitamin D  deficiency     Past Surgical History:  Procedure Laterality Date   BALLOON DILATION N/A 11/10/2014   Procedure: CYSTO BALLOON DILATION AND RETROGRADE URETHROGRAM ;  Surgeon: Erman Hayward, MD;  Location: [facility];  Service: Urology;  Laterality: N/A;   CATARACT EXTRACTION W/ INTRAOCULAR LENS  IMPLANT, BILATERAL  2012  approx   CYSTO/ BALLOON DILATION OF  URETHRAL STRICTURE  12-25-2010   CYSTOSCOPY WITH RETROGRADE URETHROGRAM N/A 03/30/2016   Procedure: CYSTOSCOPY WITH RETROGRADE URETHROGRAM AND BALLOON DILATION with cystogram;  Surgeon: Erman Hayward, MD;  Location: [facility];  Service: Urology;  Laterality: N/A;   CYSTOSCOPY WITH URETHRAL DILATATION  05/31/2011   Procedure: CYSTOSCOPY WITH URETHRAL DILATATION;  Surgeon: Devorah Fonder, MD;  Location: [facility];  Service: Urology;  Laterality: N/A;  BALLOON DILATION   CYSTOSCOPY WITH URETHRAL DILATATION N/A 09/13/2016   Procedure: CYSTOSCOPY WITH URETHRAL BALLOON DILATATION;  Surgeon: Erman Hayward, MD;  Location: [facility] [place];  Service: Urology;  Laterality: N/A;   CYSTOSCOPY WITH URETHRAL DILATATION N/A 03/30/2017   Procedure: CYSTOSCOPY WITH BALLOON URETHRAL DILATATION;  Surgeon: Erman Hayward, MD;  Location: [facility] [place];  Service: Urology;  Laterality: N/A;   CYSTOSCOPY WITH URETHRAL DILATATION N/A 09/12/2019   Procedure: CYSTOSCOPY WITH URETHRAL DILATATION;  Surgeon: Homero Luster, MD;  Location: [facility];  Service: Urology;  Laterality: N/A;   CYSTOSCOPY/RETROGRADE/URETEROSCOPY N/A 05/22/2012   Procedure: CYSTOSCOPY BALLOON DILATION RETROGRADE URETEROGRAM ;  Surgeon: Devorah Fonder, MD;   Location: [facility];  Service: Urology;  Laterality: N/A;   CYTSO/  DILATATION URETHRAL STRICTURE/  BX PROSTATIC URETHRA/  REMOVAL FOREIGN BODIES  06-25-2010   [facility]   ECTROPION REPAIR Left 06/18/2019   lower eyelid   INGUINAL HERNIA REPAIR Right 2000   MOHS SURGERY  01/ 2019    [facility]   left lower leg for [facility]   MOHS SURGERY  12/25/2018   for [facility] of left lower eyelid;  the second stage Hughs flap release 01-09-2019   RADICAL RESECTION OF SARCOMA TUMOR   03-15-2017   [facility]   RIGHT LOWER LEG , CALF AREA   RADIOACTIVE SEED IMPLANTS, PROSTATE  JAN  2003   SKIN LESION EXCISION  07/2012   [facility]  right shoulder   TONSILLECTOMY Left 11-10-2011  @[facility]    tonsillar cancer   TRANSURETHRAL INCISION OF BLADDER NECK N/A 09/12/2019   Procedure: TRANSURETHRAL INCISION OF  BLADDER NECK;  Surgeon: Homero Luster, MD;  Location: [facility];  Service: Urology;  Laterality: N/A;    Allergies  Allergen Reactions   Cefixime Rash   Contrast Media [Iodinated Contrast Media] Other (See Comments)    Avoid due to renal disease   Lisinopril  Swelling    Swollen lower lip   Macrobid [Nitrofurantoin Macrocrystal] Swelling   Nsaids Other (See Comments)    Has been told no NSAIDs, Ibuprofen Renal insufficiency hx.    Cephalosporins Rash    Outpatient Encounter Medications as of 05/22/2023  Medication Sig   amLODipine  (NORVASC ) 5 MG tablet Take 1 tablet (5 mg total) by mouth 2 (two) times daily.   buPROPion  (WELLBUTRIN ) 100 MG tablet Take 1 tablet by mouth twice daily   Cholecalciferol  (VITAMIN D ) 50 MCG (2000 UT) CAPS Take by mouth every other day.   cyanocobalamin  (VITAMIN B12) 1000 MCG/ML injection INJECT 1ML INTRAMUSCULARLY FOR 14 DAYS   ketoconazole (NIZORAL) 2 % cream Apply 1 Application topically daily.   Multiple Vitamins-Minerals (PRESERVISION AREDS 2 PO) Take by mouth. daily   NP THYROID  120 MG tablet Take 90 mg by mouth daily.   SYRINGE-NEEDLE, DISP, 3 ML (B-D 3CC LUER-LOK SYR  25GX5/8") 25G X 5/8" 3 ML MISC USE FOR INJECTION EVERY 14 DAYS   temazepam  (RESTORIL ) 7.5 MG capsule Take 7.5 mg by mouth at bedtime as needed for sleep.   sildenafil  (VIAGRA ) 100 MG tablet Take 100 mg by mouth as needed. (Patient not taking: Reported on 05/22/2023)   No facility-administered encounter medications on file as of 05/22/2023.    Review of Systems:  Review of Systems  Health Maintenance  Topic Date Due   Medicare Annual Wellness (AWV)  11/13/2020   COVID-19 Vaccine (8 - Moderna risk 2024-25 season) 11/17/2023 (Originally 04/19/2023)   Pneumonia Vaccine 3+ Years old  Completed   HPV VACCINES  Aged Out   Meningococcal B Vaccine  Aged Out   DTaP/Tdap/Td  Discontinued   INFLUENZA VACCINE  Discontinued   Zoster Vaccines- Shingrix  Discontinued    Physical Exam: Vitals:   05/22/23 1525  BP: 116/78  Pulse: 65  Resp: 18  Temp: 97.9 F (36.6 C)  SpO2: 96%  Weight: 157 lb 9.6 oz (71.5 kg)  Height: 5\' 11"  (1.803 m)   Body mass index is 21.98 kg/m. Wt Readings from Last 3 Encounters:  05/22/23 157 lb 9.6 oz (71.5 kg)  05/08/23 159 lb (72.1 kg)  04/18/23 154 lb 6.4 oz (70 kg)    Physical Exam  Labs reviewed: Basic Metabolic Panel: Recent Labs    09/29/22 0000 02/04/23 2105 02/04/23 2128 03/21/23 0000  NA 141 138 138 138  K 4.6 4.3 4.4 4.4  CL 103 103 105 100  CO2 28* 24  --  27*  GLUCOSE  --  102* 99  --   BUN 28* 35* 34* 32*  CREATININE 1.5* 1.86* 2.00* 1.4*  CALCIUM 9.0 8.9  --  9.1  MG  --  2.2  --   --    Liver Function Tests: Recent Labs    09/29/22 0000 02/04/23 2105 03/21/23 0000  AST 15 15 19   ALT 12 15 20   ALKPHOS 76 54 76  BILITOT  --  0.9  --   PROT  --  6.3*  --   ALBUMIN 3.9 3.5 3.9   No results for input(s): "LIPASE", "AMYLASE" in the last 8760 hours. No results for input(s): "AMMONIA" in the last 8760 hours. CBC:  Recent Labs    09/29/22 0000 02/04/23 2105 02/04/23 2128 03/21/23 0000  WBC 5.5 6.0  --  4.8  HGB 13.2* 12.7*  12.6* 12.3*  HCT 39* 37.4* 37.0* 37*  MCV  --  94.9  --   --   PLT 221 158  --  209   Lipid Panel: Recent Labs    09/29/22 0000 03/21/23 0000  CHOL 231* 201*  HDL 62 72*  LDLCALC 152 113  TRIG 84 78   No results found for: "HGBA1C"  Procedures since last visit: No results found.  Assessment/Plan There are no diagnoses linked to this encounter.   Labs/tests ordered:  * No order type specified *CBC BMP prior to apt Next appt:  6 weeks f/u   Total time :  time greater than 50% of total time spent doing pt counseling and coordination of care

## 2023-05-23 ENCOUNTER — Encounter: Payer: Self-pay | Admitting: Adult Health

## 2023-06-01 ENCOUNTER — Emergency Department (HOSPITAL_BASED_OUTPATIENT_CLINIC_OR_DEPARTMENT_OTHER)
Admission: EM | Admit: 2023-06-01 | Discharge: 2023-06-01 | Disposition: A | Discharge status: Home / Self Care | Attending: Emergency Medicine | Admitting: Emergency Medicine

## 2023-06-01 ENCOUNTER — Other Ambulatory Visit: Payer: Self-pay

## 2023-06-01 ENCOUNTER — Emergency Department (HOSPITAL_BASED_OUTPATIENT_CLINIC_OR_DEPARTMENT_OTHER)

## 2023-06-01 ENCOUNTER — Encounter (HOSPITAL_BASED_OUTPATIENT_CLINIC_OR_DEPARTMENT_OTHER): Payer: Self-pay | Admitting: Emergency Medicine

## 2023-06-01 DIAGNOSIS — R42 Dizziness and giddiness: Secondary | ICD-10-CM | POA: Diagnosis not present

## 2023-06-01 DIAGNOSIS — R519 Headache, unspecified: Secondary | ICD-10-CM | POA: Insufficient documentation

## 2023-06-01 DIAGNOSIS — N390 Urinary tract infection, site not specified: Secondary | ICD-10-CM | POA: Insufficient documentation

## 2023-06-01 DIAGNOSIS — N183 Chronic kidney disease, stage 3 unspecified: Secondary | ICD-10-CM | POA: Diagnosis not present

## 2023-06-01 DIAGNOSIS — Z79899 Other long term (current) drug therapy: Secondary | ICD-10-CM | POA: Insufficient documentation

## 2023-06-01 DIAGNOSIS — Z8546 Personal history of malignant neoplasm of prostate: Secondary | ICD-10-CM | POA: Insufficient documentation

## 2023-06-01 DIAGNOSIS — I129 Hypertensive chronic kidney disease with stage 1 through stage 4 chronic kidney disease, or unspecified chronic kidney disease: Secondary | ICD-10-CM | POA: Diagnosis not present

## 2023-06-01 DIAGNOSIS — Z85818 Personal history of malignant neoplasm of other sites of lip, oral cavity, and pharynx: Secondary | ICD-10-CM | POA: Diagnosis not present

## 2023-06-01 DIAGNOSIS — Z960 Presence of urogenital implants: Secondary | ICD-10-CM | POA: Insufficient documentation

## 2023-06-01 DIAGNOSIS — R600 Localized edema: Secondary | ICD-10-CM | POA: Diagnosis not present

## 2023-06-01 DIAGNOSIS — R410 Disorientation, unspecified: Secondary | ICD-10-CM | POA: Insufficient documentation

## 2023-06-01 DIAGNOSIS — E039 Hypothyroidism, unspecified: Secondary | ICD-10-CM | POA: Diagnosis not present

## 2023-06-01 LAB — COMPREHENSIVE METABOLIC PANEL WITH GFR
ALT: 17 U/L (ref 0–44)
AST: 23 U/L (ref 15–41)
Albumin: 3.8 g/dL (ref 3.5–5.0)
Alkaline Phosphatase: 80 U/L (ref 38–126)
Anion gap: 10 (ref 5–15)
BUN: 30 mg/dL — ABNORMAL HIGH (ref 8–23)
CO2: 27 mmol/L (ref 22–32)
Calcium: 8.8 mg/dL — ABNORMAL LOW (ref 8.9–10.3)
Chloride: 99 mmol/L (ref 98–111)
Creatinine, Ser: 1.51 mg/dL — ABNORMAL HIGH (ref 0.61–1.24)
GFR, Estimated: 44 mL/min — ABNORMAL LOW (ref >60)
Glucose, Bld: 102 mg/dL — ABNORMAL HIGH (ref 70–99)
Potassium: 4.3 mmol/L (ref 3.5–5.1)
Sodium: 137 mmol/L (ref 135–145)
Total Bilirubin: 0.3 mg/dL (ref 0.0–1.2)
Total Protein: 6.7 g/dL (ref 6.5–8.1)

## 2023-06-01 LAB — CBC WITH DIFFERENTIAL/PLATELET
Abs Immature Granulocytes: 0.02 10*3/uL (ref 0.00–0.07)
Basophils Absolute: 0 10*3/uL (ref 0.0–0.1)
Basophils Relative: 1 %
Eosinophils Absolute: 0 10*3/uL (ref 0.0–0.5)
Eosinophils Relative: 0 %
HCT: 37.3 % — ABNORMAL LOW (ref 39.0–52.0)
Hemoglobin: 13 g/dL (ref 13.0–17.0)
Immature Granulocytes: 1 %
Lymphocytes Relative: 20 %
Lymphs Abs: 0.8 10*3/uL (ref 0.7–4.0)
MCH: 32.1 pg (ref 26.0–34.0)
MCHC: 34.9 g/dL (ref 30.0–36.0)
MCV: 92.1 fL (ref 80.0–100.0)
Monocytes Absolute: 0.5 10*3/uL (ref 0.1–1.0)
Monocytes Relative: 12 %
Neutro Abs: 2.6 10*3/uL (ref 1.7–7.7)
Neutrophils Relative %: 66 %
Platelets: 121 10*3/uL — ABNORMAL LOW (ref 150–400)
RBC: 4.05 MIL/uL — ABNORMAL LOW (ref 4.22–5.81)
RDW: 13.4 % (ref 11.5–15.5)
WBC: 3.9 10*3/uL — ABNORMAL LOW (ref 4.0–10.5)
nRBC: 0 % (ref 0.0–0.2)

## 2023-06-01 LAB — URINALYSIS, W/ REFLEX TO CULTURE (INFECTION SUSPECTED)
Bilirubin Urine: NEGATIVE
Glucose, UA: NEGATIVE mg/dL
Ketones, ur: NEGATIVE mg/dL
Nitrite: POSITIVE — AB
Protein, ur: 30 mg/dL — AB
Specific Gravity, Urine: 1.014 (ref 1.005–1.030)
pH: 7 (ref 5.0–8.0)

## 2023-06-01 LAB — TROPONIN T, HIGH SENSITIVITY
Troponin T High Sensitivity: 36 ng/L — ABNORMAL HIGH (ref <19)
Troponin T High Sensitivity: 30 ng/L — ABNORMAL HIGH (ref <19)

## 2023-06-01 LAB — CBG MONITORING, ED: Glucose-Capillary: 106 mg/dL — ABNORMAL HIGH (ref 70–99)

## 2023-06-01 MED ORDER — FOSFOMYCIN TROMETHAMINE 3 G PO PACK
3.0000 g | PACK | Freq: Once | ORAL | Status: AC
  Administered 2023-06-01: 3 g via ORAL
  Filled 2023-06-01: qty 3

## 2023-06-01 NOTE — ED Notes (Signed)
 ED Provider at bedside.

## 2023-06-01 NOTE — ED Notes (Signed)
 Returned from MRI

## 2023-06-01 NOTE — ED Provider Notes (Signed)
  EMERGENCY DEPARTMENT AT [facility] Provider Note  CSN: [id number] Arrival date & time: 06/01/23 4098  Chief Complaint(s) Dizziness  HPI Philip [name] is a 89 y.o. male who is here today for unsteadiness on his feet.  The symptoms have been ongoing for about a year.  Last evening the patient had a headache, and the daughter who is here at bedside reports that the patient also seemed a bit confused.  Patient denies any cough, abdominal pain or dysuria.  Patient has a history of prostate cancer, has to straight cath.   Past Medical History Past Medical History:  Diagnosis Date   Age-related macular degeneration, dry, both eyes    Aortitis syndrome (HCC) rheumotologist-  dr syed   IgG4 syndrome--  (effects abdomine) ,  previously long term use prednisone  last taken 07/ 2021   Basal cell carcinoma (BCC) of eye    CKD (chronic kidney disease), stage III [facility])    nephrologist-- dr Zachery Hermes. Christianne Cowper   Complication of anesthesia    post op acute urinary retention   DDD (degenerative disc disease)    Fatigue    pt evalulation by cardiology--- dr Baldomero Levans, note 05-21-2019 in epic,(non cardiac)  normal echo and event monitor showed extra beats of atrial tachycardia , no atrial fib, and rare ecotpy   Full dentures    GERD (gastroesophageal reflux disease)    History of basal cell carcinoma (BCC) excision    12-25-2018 MOH's reconstruction left lower eyelid   History of external beam radiation therapy 2002   prostate cancer  and boost with radioative prostate seed implants   History of prostate cancer DX  2002   S/P EXTERNAL RADIATION/ RADIOACTIVE SEED IMPLANTS  2003   History of squamous cell carcinoma excision    2012--  RIGHT LOWER EXTREM;  2019 LEFT LOWER EXTREMITIY   HTN (hypertension)    followed by pcp   Hyperlipidemia    Hypothyroidism    followed by pcp--- acquired atrophy due to radiation;  previously seen by endocrinologist-  dr Ronelle Coffee--- per pt takes his own  thyroid  med. called NP Thyroid  90 mg daily, does not take synthroid    Large vessel vasculitis (HCC)    rheumotologist-  dr syed --- IgG4 related aoritis involving renal arteries ,  pt treated with predisone.  (09-06-2019  per pt no longer prednisone  last taken one month ago 07/ 2021, weaned off by pcp per pt request)   Left renal artery stenosis [facility])    nephrologist--- dr Christianne Cowper--- with aneurysm proximal    Lymphedema of left leg    followed by [facility]---- pt stated uses compression hose   OSA (obstructive sleep apnea) 09-06-2019  per pt lasted used cpap approx. 2019, stated felt he no longer needed    pt retested 11-13-2016 at [facility] w/ dr ather-- moderate to severe OSA , AHI 16.4/h/  03-24-2017    Pulmonary nodule followed by [facility]   per CT 06-11-2019  in Care Everywhere in epic done at [facility],  LUL nodule , not mets   Renal cyst, right    Restless legs syndrome (RLS)    S/P radiation therapy     for tonsillar cancer (head and neck) completed 01-26-2012 at [facility]);   and XRT for right lower leg sarcoma --- completed 06-06-2017   Saliva decreased    Sarcoma of lower extremity, right (HCC) oncolgoist-  dr Sonya Duster ([facility]) & Dr Porter Britain ([facility])   dx  02-14-2017 w/ needle core bx;  high grade pleomorphic spindle cell sarcoma , grade 2 (cT2N0M0); 03-15-2017  radical resection sarcoma tumor right lower leg  and completed radiation 06-06-2017   Self-catheterizes urinary bladder    QID   AND   PRN   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+) (Stage IVA)---- dx oct 2013  ----s/p left tonsillectomy and concurent chemo and radiation/  ended 01-26-2012-- no surgical intervention---  residuals ( dry mouth, decreased saliva)   oncologist at [facility]--  dr brizel--  HX OF -- NO RECURRENCE   Urethral false passage 07/2019   Urethral stricture urologist--- dr Inga Manges   chronic---  post urethral dilation's    Urinary retention with incomplete bladder emptying    Vitamin B 12 deficiency 01/28/2011   Vitamin D  deficiency    Patient Active Problem List   Diagnosis Date Noted   Intolerance of continuous positive airway pressure (CPAP) ventilation 10/16/2021   Chronic intermittent hypoxia with obstructive sleep apnea 10/16/2021   Choroidal nevus, left eye 05/06/2021   Moderate obstructive sleep apnea-hypopnea syndrome 03/11/2021   Adjustment insomnia 01/25/2021   History of sleep apnea 01/25/2021   Feeling grief 01/25/2021   Advanced nonexudative age-related macular degeneration of left eye without subfoveal involvement 12/31/2020   PTSD (post-traumatic stress disorder) 12/11/2019   History of sarcoma 12/11/2019   Lymphedema of left leg 12/11/2019   Posterior vitreous detachment of both eyes 11/07/2019   Advanced nonexudative age-related macular degeneration of right eye with subfoveal involvement 07/01/2019   Left leg swelling 12/19/2018   Major depressive disorder with single episode, in partial remission (HCC) 05/04/2018   Atherosclerosis of aorta (HCC) 05/04/2018   Thoracic aortic aneurysm without rupture (HCC) 05/04/2018   Urethral stricture in male 12/20/2017   Pulmonary nodules/lesions, multiple 12/20/2017   Atherosclerosis of renal artery (HCC) 07/05/2017   Chronic kidney disease, stage 3 (HCC) 03/21/2017   Complication of skin graft 03/16/2017   Mass of right lower leg 02/13/2017   Squamous cell carcinoma, leg, right 01/25/2017   Large vessel vasculitis (HCC) 06/10/2015   H/O drug therapy 12/31/2013   Hyperlipidemia 11/04/2013   Delayed effect of radiation 05/06/2013   Depression 04/01/2013   Male sexual dysfunction 01/20/2013   Fatigue 10/22/2012   Hypothyroidism 07/23/2012   History of prostate cancer    Essential hypertension    Binocular vision disorder with diplopia 07/10/2012   PLMD (periodic limb movement disorder) 09/13/2011   OSA (obstructive sleep apnea) 09/13/2011    Actinic keratosis 01/28/2011   B12 deficiency 01/28/2011   Spondylosis 08/02/2010   Insomnia 08/02/2010   Home Medication(s) Prior to Admission medications   Medication Sig Start Date End Date Taking? Authorizing Provider  amLODipine  (NORVASC ) 5 MG tablet Take 1 tablet (5 mg total) by mouth 2 (two) times daily. 04/18/23   Marguerite Shiley, MD  buPROPion  (WELLBUTRIN ) 100 MG tablet Take 1 tablet by mouth twice daily 04/19/23   Marguerite Shiley, MD  Cholecalciferol  (VITAMIN D ) 50 MCG (2000 UT) CAPS Take by mouth every other day.    [provider]  cyanocobalamin  (VITAMIN B12) 1000 MCG/ML injection INJECT 1ML INTRAMUSCULARLY FOR 14 DAYS 01/02/23   Gupta, Anjali L, MD  ketoconazole (NIZORAL) 2 % cream Apply 1 Application topically daily. 04/14/22   [provider]  Multiple Vitamins-Minerals (PRESERVISION AREDS 2 PO) Take by mouth. daily    [provider]  NP THYROID  120 MG tablet Take 90 mg by  mouth daily.    [provider]  SYRINGE-NEEDLE, DISP, 3 ML (B-D 3CC LUER-LOK SYR 25GX5/8") 25G X 5/8" 3 ML MISC USE FOR INJECTION EVERY 14 DAYS 05/17/22   Marguerite Shiley, MD  temazepam  (RESTORIL ) 7.5 MG capsule Take 7.5 mg by mouth at bedtime as needed for sleep.    [provider]                                                                                                                                    Past Surgical History Past Surgical History:  Procedure Laterality Date   BALLOON DILATION N/A 11/10/2014   Procedure: CYSTO BALLOON DILATION AND RETROGRADE URETHROGRAM ;  Surgeon: Erman Hayward, MD;  Location: [facility] [place];  Service: Urology;  Laterality: N/A;   CATARACT EXTRACTION W/ INTRAOCULAR LENS  IMPLANT, BILATERAL  2012  approx   CYSTO/ BALLOON DILATION OF  URETHRAL STRICTURE  12-25-2010   CYSTOSCOPY WITH RETROGRADE URETHROGRAM N/A 03/30/2016   Procedure: CYSTOSCOPY WITH RETROGRADE URETHROGRAM AND BALLOON DILATION with cystogram;   Surgeon: Erman Hayward, MD;  Location: [facility];  Service: Urology;  Laterality: N/A;   CYSTOSCOPY WITH URETHRAL DILATATION  05/31/2011   Procedure: CYSTOSCOPY WITH URETHRAL DILATATION;  Surgeon: Devorah Fonder, MD;  Location: [facility] [place];  Service: Urology;  Laterality: N/A;  BALLOON DILATION   CYSTOSCOPY WITH URETHRAL DILATATION N/A 09/13/2016   Procedure: CYSTOSCOPY WITH URETHRAL BALLOON DILATATION;  Surgeon: Erman Hayward, MD;  Location: [facility] [place];  Service: Urology;  Laterality: N/A;   CYSTOSCOPY WITH URETHRAL DILATATION N/A 03/30/2017   Procedure: CYSTOSCOPY WITH BALLOON URETHRAL DILATATION;  Surgeon: Erman Hayward, MD;  Location: [facility] [place];  Service: Urology;  Laterality: N/A;   CYSTOSCOPY WITH URETHRAL DILATATION N/A 09/12/2019   Procedure: CYSTOSCOPY WITH URETHRAL DILATATION;  Surgeon: Homero Luster, MD;  Location: [facility];  Service: Urology;  Laterality: N/A;   CYSTOSCOPY/RETROGRADE/URETEROSCOPY N/A 05/22/2012   Procedure: CYSTOSCOPY BALLOON DILATION RETROGRADE URETEROGRAM ;  Surgeon: Devorah Fonder, MD;  Location: [facility] ;  Service: Urology;  Laterality: N/A;   CYTSO/  DILATATION URETHRAL STRICTURE/  BX PROSTATIC URETHRA/  REMOVAL FOREIGN BODIES  06-25-2010   [facility]   ECTROPION REPAIR Left 06/18/2019   lower eyelid   INGUINAL HERNIA REPAIR Right 2000   MOHS SURGERY  01/ 2019    [facility]   left lower leg for [facility]   MOHS SURGERY  12/25/2018   for [facility] of left lower eyelid;  the second stage Hughs flap release 01-09-2019   RADICAL RESECTION OF SARCOMA TUMOR   03-15-2017   [facility]   RIGHT LOWER LEG , CALF AREA   RADIOACTIVE SEED IMPLANTS, PROSTATE  JAN  2003   SKIN LESION EXCISION  07/2012   MOST  right shoulder   TONSILLECTOMY Left 11-10-2011  @[facility]    tonsillar cancer   TRANSURETHRAL INCISION OF  BLADDER NECK N/A 09/12/2019   Procedure: TRANSURETHRAL INCISION OF BLADDER NECK;   Surgeon: Homero Luster, MD;  Location: [facility];  Service: Urology;  Laterality: N/A;   Family History Family History  Problem Relation Age of Onset   Stroke Father     Social History Social History   Tobacco Use   Smoking status: Never   Smokeless tobacco: Never  Vaping Use   Vaping status: Never Used  Substance Use Topics   Alcohol use: Yes    Alcohol/week: 7.0 standard drinks of alcohol    Types: 7 Glasses of wine per week    Comment: 8oz, glass or red wine for dinner   Drug use: No   Allergies Cefixime, Contrast media [iodinated contrast media], Lisinopril , Macrobid [nitrofurantoin macrocrystal], Nsaids, Sulfa antibiotics, and Cephalosporins  Review of Systems Review of Systems  Physical Exam Vital Signs  I have reviewed the triage vital signs BP (!) 153/112   Pulse 84   Temp 98.3 F (36.8 C)   Resp 10   Wt 68.9 kg   SpO2 99%   BMI 21.20 kg/m   Physical Exam Vitals and nursing note reviewed.  Constitutional:      Appearance: He is not ill-appearing or toxic-appearing.  HENT:     Head: Normocephalic.  Eyes:     Pupils: Pupils are equal, round, and reactive to light.     Comments: No nystagmus  Cardiovascular:     Rate and Rhythm: Normal rate.     Pulses: Normal pulses.  Pulmonary:     Effort: Pulmonary effort is normal.  Abdominal:     General: Abdomen is flat.  Musculoskeletal:     Left lower leg: Edema present.  Skin:    General: Skin is warm.  Neurological:     Mental Status: He is alert and oriented to person, place, and time.     Motor: No weakness.     Gait: Gait normal.     ED Results and Treatments Labs (all labs ordered are listed, but only abnormal results are displayed) Labs Reviewed  COMPREHENSIVE METABOLIC PANEL WITH GFR - Abnormal; Notable for the following components:      Result Value   Glucose, Bld 102 (*)    BUN 30 (*)    Creatinine, Ser 1.51 (*)    Calcium 8.8 (*)    GFR, Estimated 44 (*)    All  other components within normal limits  CBC WITH DIFFERENTIAL/PLATELET - Abnormal; Notable for the following components:   WBC 3.9 (*)    RBC 4.05 (*)    HCT 37.3 (*)    Platelets 121 (*)    All other components within normal limits  URINALYSIS, W/ REFLEX TO CULTURE (INFECTION SUSPECTED) - Abnormal; Notable for the following components:   Hgb urine dipstick SMALL (*)    Protein, ur 30 (*)    Nitrite POSITIVE (*)    Leukocytes,Ua MODERATE (*)    Bacteria, UA MANY (*)    All other components within normal limits  CBG MONITORING, ED - Abnormal; Notable for the following components:   Glucose-Capillary 106 (*)    All other components within normal limits  TROPONIN T, HIGH SENSITIVITY - Abnormal; Notable for the following components:   Troponin T High Sensitivity 36 (*)    All other components within normal limits  TROPONIN T, HIGH SENSITIVITY - Abnormal; Notable for the following components:   Troponin T High Sensitivity 30 (*)    All other components within  normal limits  URINE CULTURE                                                                                                                          Radiology MR BRAIN WO CONTRAST Result Date: 06/01/2023 CLINICAL DATA:  89 year old male with TIA, bad headache, confusion since last night. Unsteadiness. EXAM: MRI HEAD WITHOUT CONTRAST TECHNIQUE: Multiplanar, multiecho pulse sequences of the brain and surrounding structures were obtained without intravenous contrast. COMPARISON:  Head CT 02/04/2023. FINDINGS: Brain: No restricted diffusion to suggest acute infarction. No midline shift, mass effect, evidence of mass lesion, ventriculomegaly, extra-axial collection or acute intracranial hemorrhage. Cervicomedullary junction and pituitary are within normal limits. Fairly extensive superficial siderosis in the left cerebral hemisphere both the superior perirolandic area (series 12, image 49) and confluent along the lateral left temporal and  occipital lobes. Additional scattered chronic microhemorrhages, also asymmetrically affecting the left hemisphere. Superimposed mild cortical and white matter T2 and FLAIR hyperintensity in the left lateral temporal lobe affected by siderosis (series 15, image 16). Mild if any gyral enlargement there. Other scattered cerebral white matter T2 and FLAIR hyperintensity appears more ordinary, such as related to chronic small vessel disease. Minimal cortical encephalomalacia also suspected including left occipital lobe series 11, image 20. No other convincing edema. Mild T2 heterogeneity in the deep gray nuclei. Brainstem and cerebellum relatively spared. Vascular: Major intracranial vascular flow voids are preserved. Distal right vertebral artery appears to be dominant. Mild intracranial artery tortuosity. Skull and upper cervical spine: Visualized bone marrow signal is within normal limits. Negative for age visible cervical spine. Sinuses/Orbits: Postoperative changes to both globes. Mild paranasal sinus mucosal thickening. Other: Visible internal auditory structures appear normal. Mastoids are clear. Negative visible scalp and face. IMPRESSION: 1. Pronounced Superficial Siderosis in the left hemisphere with associated asymmetric chronic microhemorrhages in the brain, and evidence of mild superimposed left middle temporal gyrus edema. 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 2. No associated intracranial mass effect. No other acute intracranial abnormality. Superimposed more ordinary changes of chronic small vessel disease in the brain. Electronically Signed   By: Marlise Simpers M.D.   On: 06/01/2023 11:38    Pertinent labs & imaging results that were available during my care of the patient were reviewed by me and considered in my medical decision making (see MDM for details).  Medications Ordered in ED Medications  fosfomycin (MONUROL) packet 3 g (3 g Oral Given  06/01/23 1158)  Procedures Procedures  (including critical care time)  Medical Decision Making / ED Course   This patient presents to the ED for concern of confusion, abnormal gait, this involves an extensive number of treatment options, and is a complaint that carries with it a high risk of complications and morbidity.  The differential diagnosis includes posterior CVA, electrolyte abnormalities, UTI, dehydration, advanced age.  MDM: On my exam, the patient overall is well-appearing.  Regarding his 1 year of reduced gait which is worsened today, could certainly be posterior CVA, however my suspicion is that this is likely age-related balance changes.  I do not appreciate any confusion on the patient, he is ANO x 4.  Patient does have to straight cath, so that could certainly be a source of infection which could be contributing to some mild confusion the patient which the daughter would be more likely to be in tune with and I am.  Will check basic labs on the patient.  Will obtain MRI.  Reassessment 12:40 PM-patient has a UTI.  He has multiple allergies, will treat with single dose of fosfomycin here in the ED.  Patient's MRI shows amyloid and she both the.  Certainly could be age-related changes.  Patient does not have any obvious signs of dementia.  The patient follow-up with his primary care doctor.  Will discharge.   Additional history obtained: -Additional history obtained from daughter at bedside -External records from outside source obtained and reviewed including: Chart review including previous notes, labs, imaging, consultation notes   Lab Tests: -I ordered, reviewed, and interpreted labs.   The pertinent results include:   Labs Reviewed  COMPREHENSIVE METABOLIC PANEL WITH GFR - Abnormal; Notable for the following components:      Result Value    Glucose, Bld 102 (*)    BUN 30 (*)    Creatinine, Ser 1.51 (*)    Calcium 8.8 (*)    GFR, Estimated 44 (*)    All other components within normal limits  CBC WITH DIFFERENTIAL/PLATELET - Abnormal; Notable for the following components:   WBC 3.9 (*)    RBC 4.05 (*)    HCT 37.3 (*)    Platelets 121 (*)    All other components within normal limits  URINALYSIS, W/ REFLEX TO CULTURE (INFECTION SUSPECTED) - Abnormal; Notable for the following components:   Hgb urine dipstick SMALL (*)    Protein, ur 30 (*)    Nitrite POSITIVE (*)    Leukocytes,Ua MODERATE (*)    Bacteria, UA MANY (*)    All other components within normal limits  CBG MONITORING, ED - Abnormal; Notable for the following components:   Glucose-Capillary 106 (*)    All other components within normal limits  TROPONIN T, HIGH SENSITIVITY - Abnormal; Notable for the following components:   Troponin T High Sensitivity 36 (*)    All other components within normal limits  TROPONIN T, HIGH SENSITIVITY - Abnormal; Notable for the following components:   Troponin T High Sensitivity 30 (*)    All other components within normal limits  URINE CULTURE      EKG my independent review of the patient's EKG shows no ST segment depressions or elevations, no T wave inversions, no evidence of acute ischemia.  EKG Interpretation Date/Time:  Thursday Jun 01 2023 10:10:57 EDT Ventricular Rate:  82 PR Interval:  267 QRS Duration:  96 QT Interval:  424 QTC Calculation: 496 R Axis:   64  Text Interpretation: Sinus rhythm Prolonged PR interval  Anteroseptal infarct, age indeterminate Confirmed by Afton Horse [phone]) on 06/01/2023 12:45:24 PM         Imaging Studies ordered: I ordered imaging studies including  I independently visualized and interpreted imaging. I agree with the radiologist interpretation   Medicines ordered and prescription drug management: Meds ordered this encounter  Medications   fosfomycin (MONUROL) packet 3  g    -I have reviewed the patients home medicines and have made adjustments as needed   Cardiac Monitoring: The patient was maintained on a cardiac monitor.  I personally viewed and interpreted the cardiac monitored which showed an underlying rhythm of: Normal sinus rhythm  Social Determinants of Health:  Factors impacting patients care include: Advanced age   Reevaluation: After the interventions noted above, I reevaluated the patient and found that they have :improved  Co morbidities that complicate the patient evaluation  Past Medical History:  Diagnosis Date   Age-related macular degeneration, dry, both eyes    Aortitis syndrome (HCC) rheumotologist-  dr syed   IgG4 syndrome--  (effects abdomine) ,  previously long term use prednisone  last taken 07/ 2021   Basal cell carcinoma (BCC) of eye    CKD (chronic kidney disease), stage III [facility])    nephrologist-- dr Zachery Hermes. Christianne Cowper   Complication of anesthesia    post op acute urinary retention   DDD (degenerative disc disease)    Fatigue    pt evalulation by cardiology--- dr Baldomero Levans, note 05-21-2019 in epic,(non cardiac)  normal echo and event monitor showed extra beats of atrial tachycardia , no atrial fib, and rare ecotpy   Full dentures    GERD (gastroesophageal reflux disease)    History of basal cell carcinoma (BCC) excision    12-25-2018 MOH's reconstruction left lower eyelid   History of external beam radiation therapy 2002   prostate cancer  and boost with radioative prostate seed implants   History of prostate cancer DX  2002   S/P EXTERNAL RADIATION/ RADIOACTIVE SEED IMPLANTS  2003   History of squamous cell carcinoma excision    2012--  RIGHT LOWER EXTREM;  2019 LEFT LOWER EXTREMITIY   HTN (hypertension)    followed by pcp   Hyperlipidemia    Hypothyroidism    followed by pcp--- acquired atrophy due to radiation;  previously seen by endocrinologist-  dr Ronelle Coffee--- per pt takes his own thyroid  med. called NP Thyroid  90  mg daily, does not take synthroid    Large vessel vasculitis (HCC)    rheumotologist-  dr syed --- IgG4 related aoritis involving renal arteries ,  pt treated with predisone.  (09-06-2019  per pt no longer prednisone  last taken one month ago 07/ 2021, weaned off by pcp per pt request)   Left renal artery stenosis [facility])    nephrologist--- dr Christianne Cowper--- with aneurysm proximal    Lymphedema of left leg    followed by [facility]---- pt stated uses compression hose   OSA (obstructive sleep apnea) 09-06-2019  per pt lasted used cpap approx. 2019, stated felt he no longer needed    pt retested 11-13-2016 at [facility] w/ dr ather-- moderate to severe OSA , AHI 16.4/h/  03-24-2017    Pulmonary nodule followed by [facility]   per CT 06-11-2019  in Care Everywhere in epic done at [facility],  LUL nodule , not mets   Renal cyst, right    Restless legs syndrome (RLS)    S/P radiation therapy     for tonsillar cancer (  head and neck) completed 01-26-2012 at [facility]);   and XRT for right lower leg sarcoma --- completed 06-06-2017   Saliva decreased    Sarcoma of lower extremity, right (HCC) oncolgoist-  dr Sonya Duster ([facility]) & Dr Dyana Glade. 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+) (Stage IVA)---- dx oct 2013  ----s/p left tonsillectomy and concurent chemo and radiation/  ended 01-26-2012-- no surgical intervention---  residuals ( dry mouth, decreased saliva)   oncologist at [facility]--  dr brizel--  HX OF -- NO RECURRENCE   Urethral false passage 07/2019   Urethral stricture urologist--- dr Inga Manges   chronic---  post urethral dilation's   Urinary retention with incomplete  bladder emptying    Vitamin B 12 deficiency 01/28/2011   Vitamin D  deficiency       Dispostion: I considered admission for this patient, however the patient is appropriate for discharge and outpatient follow-up     Final Clinical Impression(s) / ED Diagnoses Final diagnoses:  Urinary tract infection associated with catheterization of urinary tract, unspecified indwelling urinary catheter type, initial encounter [facility])     @PCDICTATION @    Afton Horse T, DO 06/01/23 1247

## 2023-06-01 NOTE — Discharge Instructions (Signed)
 You were diagnosed with a UTI.  You are treated with an antibiotic called fosfomycin.  You do not require any additional treatment for that.  Your MRI showed some amyloid deposits in the brain.  Sometimes this is seen in patients who have Alzheimer's dementia, but like we discussed this is not definitive and is something that is still being studied extensively.  Please follow-up with your primary care doctor.

## 2023-06-01 NOTE — ED Triage Notes (Signed)
 Pt bib family member with c/o unsteadiness "for awhile", feeling lightheaded, and "bad HA" with confusion starting last night @ ~ 7pm. Bilaterally equal. Daughter reports increased confusion recently

## 2023-06-02 ENCOUNTER — Encounter: Payer: Self-pay | Admitting: Adult Health

## 2023-06-02 ENCOUNTER — Non-Acute Institutional Stay (SKILLED_NURSING_FACILITY): Payer: Self-pay | Admitting: Adult Health

## 2023-06-02 DIAGNOSIS — E854 Organ-limited amyloidosis: Secondary | ICD-10-CM

## 2023-06-02 DIAGNOSIS — E034 Atrophy of thyroid (acquired): Secondary | ICD-10-CM | POA: Diagnosis not present

## 2023-06-02 DIAGNOSIS — I680 Cerebral amyloid angiopathy: Secondary | ICD-10-CM

## 2023-06-02 DIAGNOSIS — R42 Dizziness and giddiness: Secondary | ICD-10-CM

## 2023-06-02 DIAGNOSIS — E538 Deficiency of other specified B group vitamins: Secondary | ICD-10-CM

## 2023-06-02 DIAGNOSIS — F5101 Primary insomnia: Secondary | ICD-10-CM | POA: Diagnosis not present

## 2023-06-02 DIAGNOSIS — E861 Hypovolemia: Secondary | ICD-10-CM

## 2023-06-02 DIAGNOSIS — F339 Major depressive disorder, recurrent, unspecified: Secondary | ICD-10-CM

## 2023-06-02 DIAGNOSIS — R2681 Unsteadiness on feet: Secondary | ICD-10-CM

## 2023-06-02 LAB — URINE CULTURE: Culture: NO GROWTH

## 2023-06-02 MED ORDER — TEMAZEPAM 7.5 MG PO CAPS: 7.5000 mg | ORAL_CAPSULE | Freq: Every evening | ORAL | 0 refills | Status: DC | PRN

## 2023-06-02 MED ORDER — AMLODIPINE BESYLATE 5 MG PO TABS: 2.5000 mg | ORAL_TABLET | Freq: Two times a day (BID) | ORAL | Status: DC | PRN

## 2023-06-02 NOTE — Progress Notes (Signed)
 Location:  Medical illustrator of Service:  SNF (31) Provider:  Janace Mckusick, ANP [facility] [phone]   Code Status: DNR Goals of Care:     06/01/2023   10:09 AM  Advanced Directives  Does Patient Have a Medical Advance Directive? Yes     Chief Complaint  Patient presents with   Acute Visit    Weakness     HPI: The patient is an 89 year old with chronic dizziness and gait instability who presents with weakness and low blood pressure.  He is being [place] to [facility] [place] due [place].  The nurse reports his systolic bp was in the 70-80 range and his daughter was concerned for weakness. Once in bed and supine I rechecked his bp manually and it was 110/70.  He has experienced slight dizziness over the past 12 months, describing it as 'slight most of the time'. A recent episode of severe 'wobbliness' occurred on Wednesday night, but he has not fallen despite balance issues. He visited the emergency room yesterday due to a bad headache and dizziness, where an MRI was performed, superficial siderosis in the left hemisphere with associated chronic microhemorrhages in the brain and evidence of cerebral amyloid angiopathy. He reports he has had 3 other MRIs that show no acute findings ( I could not locate).   He was treated for a urinary tract infection with fosfomycin after bacteria was found in his urine, although he reports no symptoms such as burning during urination or bladder pain. The final urine culture showed no growth. He catheterizes himself four to five times a day due to bph and retention.  Troponin T was initially 36 then came down to 30.  EKG showed NSR no ST abnormalities, WBC 3.9 Plt 121 Hgb 13. BUN 30 Cr 1.51  He has a history of labile blood pressure. He took his blood pressure medication this morning, Norvasc  (amlodipine ), and thyroid  medication this morning but has not taken his eye medication. No  chest pain, trouble breathing, fever, or chills. He has experienced memory problems recently. No further headache noted.   He eats breakfast and lunch regularly at a bistro and has been eating two eggs a day, noting a recent change in the strength of the eggshells. He has a history of B12 deficiency and insomnia, for which he occasionally takes temazepam . He is on NP thyroid  at 90 mg and Wellbutrin  for depression. He has lost about 12 pounds over the last year and has a history of lymphedema on the left leg, which improved with reflexology. He uses a CPAP machine for sleep apnea.   Other pertinent information : Sarcoma of the RLE s/p XRT followed by [facility]. Lymphedema of the left leg Tonsillar ca CKD Prostate Ca Sees Dr Henrine Logan due to hx of aortitis syndrome.  Aneurysm of ascending aorta monitoring see by Dr Lorie Rook He has been reporting fatigue and insomnia for a few years.  Sleep apnea on CPAP Saw Cardiology in 2021 and then 03/30/22 for fatigue with exertion and had a work up and  has a dilated Aorta, needs MRA monitoring.  PTSD  Left renal artery stenosis  Past Medical History:  Diagnosis Date   Age-related macular degeneration, dry, both eyes    Aortitis syndrome (HCC) rheumotologist-  dr syed   IgG4 syndrome--  (effects abdomine) ,  previously long term use prednisone  last taken 07/ 2021   Basal cell carcinoma (BCC) of eye  CKD (chronic kidney disease), stage III [facility])    nephrologist-- dr Zachery Hermes. Christianne Cowper   Complication of anesthesia    post op acute urinary retention   DDD (degenerative disc disease)    Fatigue    pt evalulation by cardiology--- dr Baldomero Levans, note 05-21-2019 in epic,(non cardiac)  normal echo and event monitor showed extra beats of atrial tachycardia , no atrial fib, and rare ecotpy   Full dentures    GERD (gastroesophageal reflux disease)    History of basal cell carcinoma (BCC) excision    12-25-2018 MOH's reconstruction left lower eyelid   History of external  beam radiation therapy 2002   prostate cancer  and boost with radioative prostate seed implants   History of prostate cancer DX  2002   S/P EXTERNAL RADIATION/ RADIOACTIVE SEED IMPLANTS  2003   History of squamous cell carcinoma excision    2012--  RIGHT LOWER EXTREM;  2019 LEFT LOWER EXTREMITIY   HTN (hypertension)    followed by pcp   Hyperlipidemia    Hypothyroidism    followed by pcp--- acquired atrophy due to radiation;  previously seen by endocrinologist-  dr Ronelle Coffee--- per pt takes his own thyroid  med. called NP Thyroid  90 mg daily, does not take synthroid    Large vessel vasculitis (HCC)    rheumotologist-  dr syed --- IgG4 related aoritis involving renal arteries ,  pt treated with predisone.  (09-06-2019  per pt no longer prednisone  last taken one month ago 07/ 2021, weaned off by pcp per pt request)   Left renal artery stenosis [facility])    nephrologist--- dr Christianne Cowper--- with aneurysm proximal    Lymphedema of left leg    followed by [facility]---- pt stated uses compression hose   OSA (obstructive sleep apnea) 09-06-2019  per pt lasted used cpap approx. 2019, stated felt he no longer needed    pt retested 11-13-2016 at [facility] w/ dr ather-- moderate to severe OSA , AHI 16.4/h/  03-24-2017    Pulmonary nodule followed by [facility]   per CT 06-11-2019  in Care Everywhere in epic done at [facility],  LUL nodule , not mets   Renal cyst, right    Restless legs syndrome (RLS)    S/P radiation therapy     for tonsillar cancer (head and neck) completed 01-26-2012 at [facility]);   and XRT for right lower leg sarcoma --- completed 06-06-2017   Saliva decreased    Sarcoma of lower extremity, right (HCC) oncolgoist-  dr Sonya Duster ([facility]) & Dr Porter Britain ([facility])   dx 02-14-2017 w/ needle core bx;  high grade pleomorphic spindle cell sarcoma , grade 2 (cT2N0M0); 03-15-2017  radical resection sarcoma tumor right lower leg  and completed radiation 06-06-2017    Self-catheterizes urinary bladder    QID   AND   PRN   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+) (Stage IVA)---- dx oct 2013  ----s/p left tonsillectomy and concurent chemo and radiation/  ended 01-26-2012-- no surgical intervention---  residuals ( dry mouth, decreased saliva)   oncologist at [facility]--  dr brizel--  HX OF -- NO RECURRENCE   Urethral false passage 07/2019   Urethral stricture urologist--- dr Inga Manges   chronic---  post urethral dilation's   Urinary retention with incomplete bladder emptying    Vitamin B 12 deficiency 01/28/2011   Vitamin D  deficiency     Past Surgical History:  Procedure Laterality  Date   BALLOON DILATION N/A 11/10/2014   Procedure: CYSTO BALLOON DILATION AND RETROGRADE URETHROGRAM ;  Surgeon: Erman Hayward, MD;  Location: [facility];  Service: Urology;  Laterality: N/A;   CATARACT EXTRACTION W/ INTRAOCULAR LENS  IMPLANT, BILATERAL  2012  approx   CYSTO/ BALLOON DILATION OF  URETHRAL STRICTURE  12-25-2010   CYSTOSCOPY WITH RETROGRADE URETHROGRAM N/A 03/30/2016   Procedure: CYSTOSCOPY WITH RETROGRADE URETHROGRAM AND BALLOON DILATION with cystogram;  Surgeon: Erman Hayward, MD;  Location: [facility];  Service: Urology;  Laterality: N/A;   CYSTOSCOPY WITH URETHRAL DILATATION  05/31/2011   Procedure: CYSTOSCOPY WITH URETHRAL DILATATION;  Surgeon: Devorah Fonder, MD;  Location: [facility] [place];  Service: Urology;  Laterality: N/A;  BALLOON DILATION   CYSTOSCOPY WITH URETHRAL DILATATION N/A 09/13/2016   Procedure: CYSTOSCOPY WITH URETHRAL BALLOON DILATATION;  Surgeon: Erman Hayward, MD;  Location: [facility] [place];  Service: Urology;  Laterality: N/A;   CYSTOSCOPY WITH URETHRAL DILATATION N/A 03/30/2017   Procedure: CYSTOSCOPY WITH BALLOON URETHRAL DILATATION;  Surgeon: Erman Hayward, MD;  Location: [facility] [place];  Service: Urology;   Laterality: N/A;   CYSTOSCOPY WITH URETHRAL DILATATION N/A 09/12/2019   Procedure: CYSTOSCOPY WITH URETHRAL DILATATION;  Surgeon: Homero Luster, MD;  Location: [facility];  Service: Urology;  Laterality: N/A;   CYSTOSCOPY/RETROGRADE/URETEROSCOPY N/A 05/22/2012   Procedure: CYSTOSCOPY BALLOON DILATION RETROGRADE URETEROGRAM ;  Surgeon: Devorah Fonder, MD;  Location: [facility] [place];  Service: Urology;  Laterality: N/A;   CYTSO/  DILATATION URETHRAL STRICTURE/  BX PROSTATIC URETHRA/  REMOVAL FOREIGN BODIES  06-25-2010   [facility]   ECTROPION REPAIR Left 06/18/2019   lower eyelid   INGUINAL HERNIA REPAIR Right 2000   MOHS SURGERY  01/ 2019    [facility]   left lower leg for [facility]   MOHS SURGERY  12/25/2018   for [facility] of left lower eyelid;  the second stage Hughs flap release 01-09-2019   RADICAL RESECTION OF SARCOMA TUMOR   03-15-2017   [facility]   RIGHT LOWER LEG , CALF AREA   RADIOACTIVE SEED IMPLANTS, PROSTATE  JAN  2003   SKIN LESION EXCISION  07/2012   MOST  right shoulder   TONSILLECTOMY Left 11-10-2011  @[facility]    tonsillar cancer   TRANSURETHRAL INCISION OF BLADDER NECK N/A 09/12/2019   Procedure: TRANSURETHRAL INCISION OF BLADDER NECK;  Surgeon: Homero Luster, MD;  Location: [facility] [place];  Service: Urology;  Laterality: N/A;    Allergies  Allergen Reactions   Cefixime Rash   Contrast Media [Iodinated Contrast Media] Other (See Comments)    Avoid due to renal disease   Lisinopril  Swelling    Swollen lower lip   Macrobid [Nitrofurantoin Macrocrystal] Swelling   Nsaids Other (See Comments)    Has been told no NSAIDs, Ibuprofen Renal insufficiency hx.    Sulfa Antibiotics    Cephalosporins Rash    Outpatient Encounter Medications as of 06/02/2023  Medication Sig   amLODipine  (NORVASC ) 5 MG tablet Take 1 tablet (5 mg total) by mouth 2 (two) times daily.   buPROPion  (WELLBUTRIN ) 100 MG tablet Take 1 tablet by mouth twice daily   Cholecalciferol  (VITAMIN D ) 50  MCG (2000 UT) CAPS Take by mouth every other day.   cyanocobalamin  (VITAMIN B12) 1000 MCG/ML injection INJECT 1ML INTRAMUSCULARLY FOR 14 DAYS   ketoconazole (NIZORAL) 2 % cream Apply 1 Application topically daily.   Multiple Vitamins-Minerals (PRESERVISION AREDS 2 PO) Take by  mouth. daily   NP THYROID  120 MG tablet Take 90 mg by mouth daily.   SYRINGE-NEEDLE, DISP, 3 ML (B-D 3CC LUER-LOK SYR 25GX5/8") 25G X 5/8" 3 ML MISC USE FOR INJECTION EVERY 14 DAYS   temazepam  (RESTORIL ) 7.5 MG capsule Take 1 capsule (7.5 mg total) by mouth at bedtime as needed for sleep.   [DISCONTINUED] temazepam  (RESTORIL ) 7.5 MG capsule Take 7.5 mg by mouth at bedtime as needed for sleep.   No facility-administered encounter medications on file as of 06/02/2023.    Review of Systems:  Review of Systems  Constitutional:  Positive for activity change and fatigue. Negative for appetite change, chills, diaphoresis and fever.  HENT:  Negative for congestion.   Respiratory:  Negative for cough, shortness of breath and wheezing.   Cardiovascular:  Positive for leg swelling. Negative for chest pain and palpitations.  Gastrointestinal:  Negative for abdominal distention, abdominal pain, constipation, diarrhea, nausea and vomiting.  Genitourinary:  Negative for difficulty urinating, dysuria and urgency.  Musculoskeletal:  Positive for gait problem. Negative for back pain, myalgias and neck pain.  Skin:  Negative for rash.  Neurological:  Positive for dizziness and weakness. Negative for tremors, seizures, syncope and speech difficulty.  Psychiatric/Behavioral:  Positive for confusion. Negative for agitation and behavioral problems.     Health Maintenance  Topic Date Due   Medicare Annual Wellness (AWV)  11/13/2020   COVID-19 Vaccine (8 - Moderna risk 2024-25 season) 11/17/2023 (Originally 04/19/2023)   Pneumonia Vaccine 53+ Years old  Completed   HPV VACCINES  Aged Out   Meningococcal B Vaccine  Aged Out   DTaP/Tdap/Td   Discontinued   INFLUENZA VACCINE  Discontinued   Zoster Vaccines- Shingrix  Discontinued    Physical Exam: Vitals:   06/02/23 1326  BP: 110/70  Pulse: 71  Resp: 18  Temp: 97.7 F (36.5 C)  SpO2: 100%   There is no height or weight on file to calculate BMI. Physical Exam Vitals and nursing note reviewed.  Constitutional:      Appearance: Normal appearance.  HENT:     Head: Normocephalic and atraumatic.     Nose: Nose normal.     Mouth/Throat:     Mouth: Mucous membranes are dry.     Pharynx: Oropharynx is clear.  Eyes:     Extraocular Movements: Extraocular movements intact.     Conjunctiva/sclera: Conjunctivae normal.     Pupils: Pupils are equal, round, and reactive to light.  Cardiovascular:     Rate and Rhythm: Normal rate and regular rhythm.     Heart sounds: No murmur heard. Pulmonary:     Effort: Pulmonary effort is normal. No respiratory distress.     Breath sounds: Normal breath sounds. No wheezing.  Abdominal:     General: Bowel sounds are normal. There is no distension.     Palpations: Abdomen is soft.     Tenderness: There is no abdominal tenderness.  Musculoskeletal:     Cervical back: No rigidity.     Right lower leg: No edema.     Left lower leg: Edema present.  Lymphadenopathy:     Cervical: No cervical adenopathy.  Skin:    General: Skin is warm and dry.  Neurological:     General: No focal deficit present.     Mental Status: He is alert and oriented to person, place, and time. Mental status is at baseline.  Psychiatric:        Mood and Affect: Mood normal.  Labs reviewed: Basic Metabolic Panel: Recent Labs    02/04/23 2105 02/04/23 2128 03/21/23 0000 06/01/23 1019  NA 138 138 138 137  K 4.3 4.4 4.4 4.3  CL 103 105 100 99  CO2 24  --  27* 27  GLUCOSE 102* 99  --  102*  BUN 35* 34* 32* 30*  CREATININE 1.86* 2.00* 1.4* 1.51*  CALCIUM 8.9  --  9.1 8.8*  MG 2.2  --   --   --    Liver Function Tests: Recent Labs    02/04/23 2105  03/21/23 0000 06/01/23 1019  AST 15 19 23   ALT 15 20 17   ALKPHOS 54 76 80  BILITOT 0.9  --  0.3  PROT 6.3*  --  6.7  ALBUMIN 3.5 3.9 3.8   No results for input(s): "LIPASE", "AMYLASE" in the last 8760 hours. No results for input(s): "AMMONIA" in the last 8760 hours. CBC: Recent Labs    02/04/23 2105 02/04/23 2128 03/21/23 0000 06/01/23 1019  WBC 6.0  --  4.8 3.9*  NEUTROABS  --   --   --  2.6  HGB 12.7* 12.6* 12.3* 13.0  HCT 37.4* 37.0* 37* 37.3*  MCV 94.9  --   --  92.1  PLT 158  --  209 121*   Lipid Panel: Recent Labs    09/29/22 0000 03/21/23 0000  CHOL 231* 201*  HDL 62 72*  LDLCALC 152 113  TRIG 84 78   No results found for: "HGBA1C"  Procedures since last visit: MR BRAIN WO CONTRAST Result Date: 06/01/2023 CLINICAL DATA:  89 year old male with TIA, bad headache, confusion since last night. Unsteadiness. EXAM: MRI HEAD WITHOUT CONTRAST TECHNIQUE: Multiplanar, multiecho pulse sequences of the brain and surrounding structures were obtained without intravenous contrast. COMPARISON:  Head CT 02/04/2023. FINDINGS: Brain: No restricted diffusion to suggest acute infarction. No midline shift, mass effect, evidence of mass lesion, ventriculomegaly, extra-axial collection or acute intracranial hemorrhage. Cervicomedullary junction and pituitary are within normal limits. Fairly extensive superficial siderosis in the left cerebral hemisphere both the superior perirolandic area (series 12, image 49) and confluent along the lateral left temporal and occipital lobes. Additional scattered chronic microhemorrhages, also asymmetrically affecting the left hemisphere. Superimposed mild cortical and white matter T2 and FLAIR hyperintensity in the left lateral temporal lobe affected by siderosis (series 15, image 16). Mild if any gyral enlargement there. Other scattered cerebral white matter T2 and FLAIR hyperintensity appears more ordinary, such as related to chronic small vessel disease.  Minimal cortical encephalomalacia also suspected including left occipital lobe series 11, image 20. No other convincing edema. Mild T2 heterogeneity in the deep gray nuclei. Brainstem and cerebellum relatively spared. Vascular: Major intracranial vascular flow voids are preserved. Distal right vertebral artery appears to be dominant. Mild intracranial artery tortuosity. Skull and upper cervical spine: Visualized bone marrow signal is within normal limits. Negative for age visible cervical spine. Sinuses/Orbits: Postoperative changes to both globes. Mild paranasal sinus mucosal thickening. Other: Visible internal auditory structures appear normal. Mastoids are clear. Negative visible scalp and face. IMPRESSION: 1. Pronounced Superficial Siderosis in the left hemisphere with associated asymmetric chronic microhemorrhages in the brain, and evidence of mild superimposed left middle temporal gyrus edema. 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 2. No associated intracranial mass effect. No other acute intracranial abnormality. Superimposed more ordinary changes of chronic small vessel disease in the brain. Electronically Signed   By: Marlise Simpers M.D.   On:  06/01/2023 11:38    Assessment/Plan Gait instability Gait instability possibly related to chronic brain changes or variable blood pressure. Neurologist consultation recommended. PT and OT recommended.   Dizziness Intermittent dizziness possibly related to chronic brain changes or variable blood pressure. No acute MRI findings. Neurologist evaluation planned. -IVF NS x 1 liter -PT and OT eval and treat  Chronic microhemorrhages and amyloid angiopathy Chronic microhemorrhages and edema with amyloid angiopathy on MRI. No acute stroke. Symptoms include dizziness and gait instability. Also some mild memory loss detected. Neurologist consultation recommended. - Follow up with neurologist for further evaluation  of chronic brain changes and gait instability  Hypotension Hypotension with low blood pressure readings. Possible sensitivity to amlodipine . Blood pressure improved after lying supine. Appears dry on exam - Recheck blood pressure after supine position. - Administer a liter of fluid to improve blood pressure. - Make Norvasc  as needed based on blood pressure readings.  B12 deficiency B12 deficiency managed with supplementation.  Insomnia Insomnia managed with temazepam , though not taken regularly.  Depression Depression managed with Wellbutrin .  Hypothyroidism Managed by endocrinology on NP thyroid   BPH with urinary retention Self cath QID  nurse to assist if needed.   Thrombocytopenia  With mild leukopenia Repeat CBC  Ordered stat CBC CMP CXR

## 2023-06-05 ENCOUNTER — Non-Acute Institutional Stay (SKILLED_NURSING_FACILITY): Payer: Self-pay | Admitting: Internal Medicine

## 2023-06-05 DIAGNOSIS — R42 Dizziness and giddiness: Secondary | ICD-10-CM

## 2023-06-05 DIAGNOSIS — F5101 Primary insomnia: Secondary | ICD-10-CM | POA: Diagnosis not present

## 2023-06-05 DIAGNOSIS — E854 Organ-limited amyloidosis: Secondary | ICD-10-CM | POA: Diagnosis not present

## 2023-06-05 DIAGNOSIS — N3000 Acute cystitis without hematuria: Secondary | ICD-10-CM

## 2023-06-05 DIAGNOSIS — I680 Cerebral amyloid angiopathy: Secondary | ICD-10-CM

## 2023-06-05 DIAGNOSIS — R2681 Unsteadiness on feet: Secondary | ICD-10-CM

## 2023-06-05 DIAGNOSIS — E034 Atrophy of thyroid (acquired): Secondary | ICD-10-CM

## 2023-06-05 DIAGNOSIS — F339 Major depressive disorder, recurrent, unspecified: Secondary | ICD-10-CM

## 2023-06-05 DIAGNOSIS — E538 Deficiency of other specified B group vitamins: Secondary | ICD-10-CM

## 2023-06-05 LAB — BASIC METABOLIC PANEL WITH GFR
BUN: 28 — AB (ref 4–21)
CO2: 25 — AB (ref 13–22)
Chloride: 104 (ref 99–108)
Creatinine: 1.5 — AB (ref 0.6–1.3)
Glucose: 96
Potassium: 4.5 meq/L (ref 3.5–5.1)
Sodium: 140 (ref 137–147)

## 2023-06-05 LAB — COMPREHENSIVE METABOLIC PANEL WITH GFR
Calcium: 8.2 — AB (ref 8.7–10.7)
eGFR: 32

## 2023-06-05 NOTE — Progress Notes (Signed)
 Provider:   Location:  Medical illustrator of Service:  SNF (31)  PCP: Marguerite Shiley, MD Patient Care Team: Marguerite Shiley, MD as PCP - General (Internal Medicine) Kyra Phy, MD as PCP - Cardiology (Cardiology) Erman Hayward, MD as Consulting Physician (Urology) [facility] Brizel, Lavelle Posey, MD (Radiation Oncology) Tasia Farr, MD as Consulting Physician (Endocrinology) Lolly Riser Rollene Clink, MD as Referring Physician (Orthopedic Surgery) Alvester Aw, MD (Rheumatology)  Extended Emergency Contact Information Primary Emergency Contact: Loralyn Rochester Address: [address]          [place], [place] [address] [place] Home Phone: [phone] Mobile Phone: [phone] Relation: Daughter  Code Status: DNR Goals of Care: Advanced Directive information    06/01/2023   10:09 AM  Advanced Directives  Does Patient Have a Medical Advance Directive? Yes      Chief Complaint  Patient presents with   New Admit To SNF    HPI: Patient is a 89 y.o. male seen today for admission to Rehab    [place]  He was in ED on 06/01/2023 for Confusion per his daughter Diagnosed With UTI and was treated with Fosfomycin He also had MRI brain done and he has asymmetric chronic microhemorrhages in the brain, and evidence of mild superimposed left middle temporal gyrus edema. Constellation favors cerebral Amyloid Angiopathy   He was discharged and then was found to be in [facility] facility feeling dizzy and weak and his SBP was in 80s He was admitted to Rehab and given IV fluids over the weekend He reports that he is back to normal now And wants to go home Norvasc  has been on hold due to Low BP and Dizziness   The patient, also with a history of hypertension, macular degeneration, and depression,    Recently had adverse reaction to Lisinopril  with Lip swelling Lisinopril  changed to Norvasc   sarcoma of Right  lower extremity Follows closely with [facility] at [facility] for [facility] scan of his chest for lung nodules. Has been in remission. Recent Imaging has not shown any issues   History of prostate cancer.Self Catheterizes History of tonsillar cancer Macular degeneration follows with ophthalmologist   Aortitis syndrome.  Follows with Dr. Henrine Logan who sometimes has to use prednisone  as needed   B12 deficiency takes injection self administer   Hypothyroid follows with Dr. Ronelle Coffee    CKD follows with Renal   Sleep Apnea CPAP     Past Medical History:  Diagnosis Date   Age-related macular degeneration, dry, both eyes    Aortitis syndrome (HCC) rheumotologist-  dr syed   IgG4 syndrome--  (effects abdomine) ,  previously long term use prednisone  last taken 07/ 2021   Basal cell carcinoma (BCC) of eye    CKD (chronic kidney disease), stage III [facility])    nephrologist-- dr Zachery Hermes. Christianne Cowper   Complication of anesthesia    post op acute urinary retention   DDD (degenerative disc disease)    Fatigue    pt evalulation by cardiology--- dr Baldomero Levans, note 05-21-2019 in epic,(non cardiac)  normal echo and event monitor showed extra beats of atrial tachycardia , no atrial fib, and rare ecotpy   Full dentures    GERD (gastroesophageal reflux disease)    History of basal cell carcinoma (BCC) excision    12-25-2018 MOH's reconstruction left lower eyelid   History of external beam radiation therapy 2002   prostate cancer  and boost with radioative  prostate seed implants   History of prostate cancer DX  2002   S/P EXTERNAL RADIATION/ RADIOACTIVE SEED IMPLANTS  2003   History of squamous cell carcinoma excision    2012--  RIGHT LOWER EXTREM;  2019 LEFT LOWER EXTREMITIY   HTN (hypertension)    followed by pcp   Hyperlipidemia    Hypothyroidism    followed by pcp--- acquired atrophy due to radiation;  previously seen by endocrinologist-  dr Ronelle Coffee--- per pt takes his own thyroid  med. called NP Thyroid  90 mg daily, does not  take synthroid    Large vessel vasculitis (HCC)    rheumotologist-  dr syed --- IgG4 related aoritis involving renal arteries ,  pt treated with predisone.  (09-06-2019  per pt no longer prednisone  last taken one month ago 07/ 2021, weaned off by pcp per pt request)   Left renal artery stenosis [facility])    nephrologist--- dr Christianne Cowper--- with aneurysm proximal    Lymphedema of left leg    followed by [facility]---- pt stated uses compression hose   OSA (obstructive sleep apnea) 09-06-2019  per pt lasted used cpap approx. 2019, stated felt he no longer needed    pt retested 11-13-2016 at [facility] w/ dr ather-- moderate to severe OSA , AHI 16.4/h/  03-24-2017    Pulmonary nodule followed by [facility]   per CT 06-11-2019  in Care Everywhere in epic done at [facility],  LUL nodule , not mets   Renal cyst, right    Restless legs syndrome (RLS)    S/P radiation therapy     for tonsillar cancer (head and neck) completed 01-26-2012 at [facility]);   and XRT for right lower leg sarcoma --- completed 06-06-2017   Saliva decreased    Sarcoma of lower extremity, right (HCC) oncolgoist-  dr Sonya Duster ([facility]) & Dr Porter Britain ([facility])   dx 02-14-2017 w/ needle core bx;  high grade pleomorphic spindle cell sarcoma , grade 2 (cT2N0M0); 03-15-2017  radical resection sarcoma tumor right lower leg  and completed radiation 06-06-2017   Self-catheterizes urinary bladder    QID   AND   PRN   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+) (Stage IVA)---- dx oct 2013  ----s/p left tonsillectomy and concurent chemo and radiation/  ended 01-26-2012-- no surgical intervention---  residuals ( dry mouth, decreased saliva)   oncologist at [facility]--  dr brizel--  HX OF -- NO RECURRENCE   Urethral false passage 07/2019   Urethral stricture urologist--- dr Inga Manges   chronic---  post urethral dilation's   Urinary retention with incomplete bladder emptying     Vitamin B 12 deficiency 01/28/2011   Vitamin D  deficiency    Past Surgical History:  Procedure Laterality Date   BALLOON DILATION N/A 11/10/2014   Procedure: CYSTO BALLOON DILATION AND RETROGRADE URETHROGRAM ;  Surgeon: Erman Hayward, MD;  Location: [facility];  Service: Urology;  Laterality: N/A;   CATARACT EXTRACTION W/ INTRAOCULAR LENS  IMPLANT, BILATERAL  2012  approx   CYSTO/ BALLOON DILATION OF  URETHRAL STRICTURE  12-25-2010   CYSTOSCOPY WITH RETROGRADE URETHROGRAM N/A 03/30/2016   Procedure: CYSTOSCOPY WITH RETROGRADE URETHROGRAM AND BALLOON DILATION with cystogram;  Surgeon: Erman Hayward, MD;  [facility];  Service: Urology;  Laterality: N/A;   CYSTOSCOPY WITH URETHRAL DILATATION  05/31/2011   Procedure: CYSTOSCOPY WITH URETHRAL DILATATION;  Surgeon: Devorah Fonder, MD;  Location: [facility];  Service: Urology;  Laterality: N/A;  BALLOON DILATION   CYSTOSCOPY WITH URETHRAL DILATATION N/A 09/13/2016   Procedure: CYSTOSCOPY WITH URETHRAL BALLOON DILATATION;  Surgeon: Erman Hayward, MD;  Location: [facility];  Service: Urology;  Laterality: N/A;   CYSTOSCOPY WITH URETHRAL DILATATION N/A 03/30/2017   Procedure: CYSTOSCOPY WITH BALLOON URETHRAL DILATATION;  Surgeon: Erman Hayward, MD;  Location: [facility];  Service: Urology;  Laterality: N/A;   CYSTOSCOPY WITH URETHRAL DILATATION N/A 09/12/2019   Procedure: CYSTOSCOPY WITH URETHRAL DILATATION;  Surgeon: Homero Luster, MD;  Location: [facility];  Service: Urology;  Laterality: N/A;   CYSTOSCOPY/RETROGRADE/URETEROSCOPY N/A 05/22/2012   Procedure: CYSTOSCOPY BALLOON DILATION RETROGRADE URETEROGRAM ;  Surgeon: Devorah Fonder, MD;  Location: [facility];  Service: Urology;  Laterality: N/A;   CYTSO/  DILATATION URETHRAL STRICTURE/  BX PROSTATIC URETHRA/  REMOVAL FOREIGN BODIES  06-25-2010   [facility]   ECTROPION REPAIR  Left 06/18/2019   lower eyelid   INGUINAL HERNIA REPAIR Right 2000   MOHS SURGERY  01/ 2019    [facility]   left lower leg for [facility]   MOHS SURGERY  12/25/2018   for [facility] of left lower eyelid;  the second stage Hughs flap release 01-09-2019   RADICAL RESECTION OF SARCOMA TUMOR   03-15-2017   [facility]   RIGHT LOWER LEG , CALF AREA   RADIOACTIVE SEED IMPLANTS, PROSTATE  JAN  2003   SKIN LESION EXCISION  07/2012   MOST  right shoulder   TONSILLECTOMY Left 11-10-2011  @[facility]    tonsillar cancer   TRANSURETHRAL INCISION OF BLADDER NECK N/A 09/12/2019   Procedure: TRANSURETHRAL INCISION OF BLADDER NECK;  Surgeon: Homero Luster, MD;  Location: [facility] [place];  Service: Urology;  Laterality: N/A;    reports that he has never smoked. He has never used smokeless tobacco. He reports current alcohol use of about 7.0 standard drinks of alcohol per week. He reports that he does not use drugs. Social History   Socioeconomic History   Marital status: Widowed    Spouse name: Not on file   Number of children: 4   Years of education: college   Highest education level: Not on file  Occupational History   Occupation: retired  Tobacco Use   Smoking status: Never   Smokeless tobacco: Never  Vaping Use   Vaping status: Never Used  Substance and Sexual Activity   Alcohol use: Yes    Alcohol/week: 7.0 standard drinks of alcohol    Types: 7 Glasses of wine per week    Comment: 8oz, glass or red wine for dinner   Drug use: No   Sexual activity: Not on file  Other Topics Concern   Not on file  Social History Narrative   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    Right handed   Social Drivers of Health   Financial Resource Strain: Low Risk  (08/08/2017)   Overall Financial Resource Strain (CARDIA)    Difficulty of Paying Living Expenses: Not hard at all  Food Insecurity: No Food Insecurity (03/30/2022)   Hunger  Vital Sign    Worried About Running Out of Food in the Last Year: Never true    Ran Out of Food in the Last Year: Never true  Transportation Needs: No Transportation Needs (03/30/2022)   PRAPARE -  Administrator, Civil Service (Medical): No    Lack of Transportation (Non-Medical): No  Physical Activity: Inactive (03/30/2022)   Exercise Vital Sign    Days of Exercise per Week: 0 days    Minutes of Exercise per Session: 0 min  Stress: No Stress Concern Present (08/08/2017)   [employer] of Occupational Health - Occupational Stress Questionnaire    Feeling of Stress : Only a little  Social Connections: Somewhat Isolated (08/08/2017)   Social Connection and Isolation Panel [NHANES]    Frequency of Communication with Friends and Family: More than three times a week    Frequency of Social Gatherings with Friends and Family: More than three times a week    Attends Religious Services: Never    Database administrator or Organizations: No    Attends Banker Meetings: Never    Marital Status: Married  Catering manager Violence: Not At Risk (08/08/2017)   Humiliation, Afraid, Rape, and Kick questionnaire    Fear of Current or Ex-Partner: No    Emotionally Abused: No    Physically Abused: No    Sexually Abused: No    Functional Status Survey:    Family History  Problem Relation Age of Onset   Stroke Father     Health Maintenance  Topic Date Due   Medicare Annual Wellness (AWV)  11/13/2020   COVID-19 Vaccine (8 - Moderna risk 2024-25 season) 11/17/2023 (Originally 04/19/2023)   Pneumonia Vaccine 58+ Years old  Completed   HPV VACCINES  Aged Out   Meningococcal B Vaccine  Aged Out   DTaP/Tdap/Td  Discontinued   INFLUENZA VACCINE  Discontinued   Zoster Vaccines- Shingrix  Discontinued    Allergies  Allergen Reactions   Cefixime Rash   Contrast Media [Iodinated Contrast Media] Other (See Comments)    Avoid due to renal disease   Lisinopril  Swelling     Swollen lower lip   Macrobid [Nitrofurantoin Macrocrystal] Swelling   Nsaids Other (See Comments)    Has been told no NSAIDs, Ibuprofen Renal insufficiency hx.    Sulfa Antibiotics    Cephalosporins Rash    Outpatient Encounter Medications as of 06/05/2023  Medication Sig   amLODipine  (NORVASC ) 5 MG tablet Take 0.5 tablets (2.5 mg total) by mouth 2 (two) times daily as needed (HTN). Give if bp >140   buPROPion  (WELLBUTRIN ) 100 MG tablet Take 1 tablet by mouth twice daily   Cholecalciferol  (VITAMIN D ) 50 MCG (2000 UT) CAPS Take by mouth every other day.   cyanocobalamin  (VITAMIN B12) 1000 MCG/ML injection INJECT 1ML INTRAMUSCULARLY FOR 14 DAYS   ketoconazole (NIZORAL) 2 % cream Apply 1 Application topically daily.   Multiple Vitamins-Minerals (PRESERVISION AREDS 2 PO) Take by mouth. daily   NP THYROID  120 MG tablet Take 90 mg by mouth daily.   SYRINGE-NEEDLE, DISP, 3 ML (B-D 3CC LUER-LOK SYR 25GX5/8") 25G X 5/8" 3 ML MISC USE FOR INJECTION EVERY 14 DAYS   temazepam  (RESTORIL ) 7.5 MG capsule Take 1 capsule (7.5 mg total) by mouth at bedtime as needed for sleep.   No facility-administered encounter medications on file as of 06/05/2023.    Review of Systems  Constitutional:  Negative for activity change, appetite change and unexpected weight change.  HENT: Negative.    Respiratory:  Negative for cough and shortness of breath.   Cardiovascular:  Negative for leg swelling.  Gastrointestinal:  Negative for constipation.  Genitourinary:  Negative for frequency.  Musculoskeletal:  Positive for gait problem.  Negative for arthralgias and myalgias.  Skin: Negative.  Negative for rash.  Neurological:  Positive for dizziness and weakness.  Psychiatric/Behavioral:  Positive for confusion. Negative for sleep disturbance.   All other systems reviewed and are negative.   There were no vitals filed for this visit. There is no height or weight on file to calculate BMI. Physical Exam Vitals  reviewed.  Constitutional:      Appearance: Normal appearance.  HENT:     Head: Normocephalic.     Nose: Nose normal.     Mouth/Throat:     Mouth: Mucous membranes are moist.     Pharynx: Oropharynx is clear.  Eyes:     Pupils: Pupils are equal, round, and reactive to light.  Cardiovascular:     Rate and Rhythm: Normal rate and regular rhythm.     Pulses: Normal pulses.     Heart sounds: No murmur heard. Pulmonary:     Effort: Pulmonary effort is normal. No respiratory distress.     Breath sounds: Normal breath sounds. No rales.  Abdominal:     General: Abdomen is flat. Bowel sounds are normal.     Palpations: Abdomen is soft.  Musculoskeletal:        General: Swelling present.     Cervical back: Neck supple.  Skin:    General: Skin is warm.  Neurological:     General: No focal deficit present.     Mental Status: He is alert and oriented to person, place, and time.  Psychiatric:        Mood and Affect: Mood normal.        Thought Content: Thought content normal.     Labs reviewed: Basic Metabolic Panel: Recent Labs    02/04/23 2105 02/04/23 2128 03/21/23 0000 06/01/23 1019  NA 138 138 138 137  K 4.3 4.4 4.4 4.3  CL 103 105 100 99  CO2 24  --  27* 27  GLUCOSE 102* 99  --  102*  BUN 35* 34* 32* 30*  CREATININE 1.86* 2.00* 1.4* 1.51*  CALCIUM 8.9  --  9.1 8.8*  MG 2.2  --   --   --    Liver Function Tests: Recent Labs    02/04/23 2105 03/21/23 0000 06/01/23 1019  AST 15 19 23   ALT 15 20 17   ALKPHOS 54 76 80  BILITOT 0.9  --  0.3  PROT 6.3*  --  6.7  ALBUMIN 3.5 3.9 3.8   No results for input(s): "LIPASE", "AMYLASE" in the last 8760 hours. No results for input(s): "AMMONIA" in the last 8760 hours. CBC: Recent Labs    02/04/23 2105 02/04/23 2128 03/21/23 0000 06/01/23 1019  WBC 6.0  --  4.8 3.9*  NEUTROABS  --   --   --  2.6  HGB 12.7* 12.6* 12.3* 13.0  HCT 37.4* 37.0* 37* 37.3*  MCV 94.9  --   --  92.1  PLT 158  --  209 121*   Cardiac  Enzymes: No results for input(s): "CKTOTAL", "CKMB", "CKMBINDEX", "TROPONINI" in the last 8760 hours. BNP: Invalid input(s): "POCBNP" No results found for: "HGBA1C" Lab Results  Component Value Date   TSH 0.97 07/29/2021   Lab Results  Component Value Date   VITAMINB12 858 03/10/2022   No results found for: "FOLATE" Lab Results  Component Value Date   IRON 129 01/31/2019   TIBC 242 01/31/2019    Imaging and Procedures obtained prior to SNF admission: MR BRAIN WO CONTRAST Result  Date: 06/01/2023 CLINICAL DATA:  89 year old male with TIA, bad headache, confusion since last night. Unsteadiness. EXAM: MRI HEAD WITHOUT CONTRAST TECHNIQUE: Multiplanar, multiecho pulse sequences of the brain and surrounding structures were obtained without intravenous contrast. COMPARISON:  Head CT 02/04/2023. FINDINGS: Brain: No restricted diffusion to suggest acute infarction. No midline shift, mass effect, evidence of mass lesion, ventriculomegaly, extra-axial collection or acute intracranial hemorrhage. Cervicomedullary junction and pituitary are within normal limits. Fairly extensive superficial siderosis in the left cerebral hemisphere both the superior perirolandic area (series 12, image 49) and confluent along the lateral left temporal and occipital lobes. Additional scattered chronic microhemorrhages, also asymmetrically affecting the left hemisphere. Superimposed mild cortical and white matter T2 and FLAIR hyperintensity in the left lateral temporal lobe affected by siderosis (series 15, image 16). Mild if any gyral enlargement there. Other scattered cerebral white matter T2 and FLAIR hyperintensity appears more ordinary, such as related to chronic small vessel disease. Minimal cortical encephalomalacia also suspected including left occipital lobe series 11, image 20. No other convincing edema. Mild T2 heterogeneity in the deep gray nuclei. Brainstem and cerebellum relatively spared. Vascular: Major  intracranial vascular flow voids are preserved. Distal right vertebral artery appears to be dominant. Mild intracranial artery tortuosity. Skull and upper cervical spine: Visualized bone marrow signal is within normal limits. Negative for age visible cervical spine. Sinuses/Orbits: Postoperative changes to both globes. Mild paranasal sinus mucosal thickening. Other: Visible internal auditory structures appear normal. Mastoids are clear. Negative visible scalp and face. IMPRESSION: 1. Pronounced Superficial Siderosis in the left hemisphere with associated asymmetric chronic microhemorrhages in the brain, and evidence of mild superimposed left middle temporal gyrus edema. 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 2. No associated intracranial mass effect. No other acute intracranial abnormality. Superimposed more ordinary changes of chronic small vessel disease in the brain. Electronically Signed   By: Marlise Simpers M.D.   On: 06/01/2023 11:38    Assessment/Plan 1. Cerebral amyloid angiopathy (HCC) (Primary) Neurology Referral made He is not on statin or Aspirin 2. Dizziness Could be related to [facility] Neurology Referral made  3. Acute cystitis without hematuria Treated with one dose of Fosfomycin Culture was negative 4. Primary insomnia Restoril  PRN Rarely uses it  5. Gait instability Follow with Therapy  6. Hypothyroidism due to acquired atrophy of thyroid  Follows with Dr Ronelle Coffee  7. B12 deficiency On IM injections  8. Major depression, recurrent, chronic (HCC) Wellbutrin  per Dr Levie Ream 9 Hypertension Making Norvasc  PRN as BP running low in 100s He will check his BP at home Twice a day Can use Norvasc  if SBP more then 130 Will reval in 2 weeks   Family/ staff Communication:   Labs/tests ordered:

## 2023-06-06 ENCOUNTER — Encounter: Payer: Self-pay | Admitting: Internal Medicine

## 2023-06-07 ENCOUNTER — Encounter: Payer: Self-pay | Admitting: Internal Medicine

## 2023-06-13 ENCOUNTER — Encounter: Payer: Self-pay | Admitting: Internal Medicine

## 2023-06-13 ENCOUNTER — Encounter: Admitting: Internal Medicine

## 2023-06-13 ENCOUNTER — Non-Acute Institutional Stay: Admitting: Internal Medicine

## 2023-06-13 VITALS — BP 130/80 | HR 62 | Temp 97.5°F | Resp 20 | Ht 71.0 in | Wt 161.6 lb

## 2023-06-13 DIAGNOSIS — E854 Organ-limited amyloidosis: Secondary | ICD-10-CM | POA: Diagnosis not present

## 2023-06-13 DIAGNOSIS — I680 Cerebral amyloid angiopathy: Secondary | ICD-10-CM

## 2023-06-13 DIAGNOSIS — R42 Dizziness and giddiness: Secondary | ICD-10-CM | POA: Diagnosis not present

## 2023-06-13 DIAGNOSIS — F5101 Primary insomnia: Secondary | ICD-10-CM | POA: Diagnosis not present

## 2023-06-13 DIAGNOSIS — I10 Essential (primary) hypertension: Secondary | ICD-10-CM

## 2023-06-13 NOTE — Progress Notes (Signed)
 Location: [facility] features editor of Service:  Clinic (12)  Provider:   Code Status:  Goals of Care:     06/01/2023   10:09 AM  Advanced Directives  Does Patient Have a Medical Advance Directive? Yes     Chief Complaint  Patient presents with   Follow-up    Follow up on UTI.    HPI: Patient is a 89 y.o. male seen today for an acute visit for follow up   [place]   He was in ED on 06/01/2023 for Confusion per his daughter Diagnosed With UTI and was treated with Fosfomycin He also had MRI brain done and he has asymmetric chronic microhemorrhages in the brain, and evidence of mild superimposed left middle temporal gyrus edema. Constellation favors cerebral Amyloid Angiopathy   He was admitted in [facility] he was found to have low blood pressure.  His amlodipine  was made as needed and he was given IV fluids That resolved his symptoms and he was discharged back to his home  Today patient came for follow-up Discussed the use of AI scribe software for clinical note transcription with the patient, who gave verbal consent to proceed.  History of Present Illness     He experiences fluctuating blood pressure readings, with a recent measurement of 166/105 mmHg upon waking, which later decreased to 130/80 mmHg. He checks his blood pressure before taking medication and sometimes delays taking it until after rechecking his readings. He experiences slight dizziness upon getting out of bed, similar to previous episodes, but has had no recent falls.  He has macular degeneration, with difficulty reading and 'smoky' vision in the left eye and blurry vision in the right eye. He receives injections for this condition and now requires ophthalmology visits every five weeks. Despite vision challenges, he continues to play table tennis, though he experiences issues with depth perception.  He uses a CPAP machine for sleep apnea and reports variable sleep quality.  He  manages his own finances and daily activities, lives independently at [place], and uses his drivers for transportation. He maintains a good appetite and nutrition, and uses a catheter without complications.      His POA stepdaughter is moving to beach and he is not going to have family close by    Other history adverse reaction to Lisinopril  with Lip swelling Lisinopril  changed to Norvasc    sarcoma of Right lower extremity Follows closely with oncology at [facility] for CT scan of his chest for lung nodules. Has been in remission. Recent Imaging has not shown any issues   History of prostate cancer.Self Catheterizes History of tonsillar cancer Macular degeneration follows with ophthalmologist   Aortitis syndrome.  Follows with Dr. Henrine Logan who sometimes has to use prednisone  as needed   B12 deficiency takes injection self administer  Past Medical History:  Diagnosis Date   Age-related macular degeneration, dry, both eyes    Aortitis syndrome (HCC) rheumotologist-  dr syed   IgG4 syndrome--  (effects abdomine) ,  previously long term use prednisone  last taken 07/ 2021   Basal cell carcinoma (BCC) of eye    CKD (chronic kidney disease), stage III [facility])    nephrologist-- dr Zachery Hermes. Christianne Cowper   Complication of anesthesia    post op acute urinary retention   DDD (degenerative disc disease)    Fatigue    pt evalulation by cardiology--- dr Baldomero Levans, note 05-21-2019 in epic,(non cardiac)  normal echo and event monitor showed extra  beats of atrial tachycardia , no atrial fib, and rare ecotpy   Full dentures    GERD (gastroesophageal reflux disease)    History of basal cell carcinoma (BCC) excision    12-25-2018 MOH's reconstruction left lower eyelid   History of external beam radiation therapy 2002   prostate cancer  and boost with radioative prostate seed implants   History of prostate cancer DX  2002   S/P EXTERNAL RADIATION/ RADIOACTIVE SEED IMPLANTS  2003   History of squamous cell  carcinoma excision    2012--  RIGHT LOWER EXTREM;  2019 LEFT LOWER EXTREMITIY   HTN (hypertension)    followed by pcp   Hyperlipidemia    Hypothyroidism    followed by pcp--- acquired atrophy due to radiation;  previously seen by endocrinologist-  dr Ronelle Coffee--- per pt takes his own thyroid  med. called NP Thyroid  90 mg daily, does not take synthroid    Large vessel vasculitis (HCC)    rheumotologist-  dr syed --- IgG4 related aoritis involving renal arteries ,  pt treated with predisone.  (09-06-2019  per pt no longer prednisone  last taken one month ago 07/ 2021, weaned off by pcp per pt request)   Left renal artery stenosis [facility])    nephrologist--- dr Christianne Cowper--- with aneurysm proximal    Lymphedema of left leg    followed by [facility]---- pt stated uses compression hose   OSA (obstructive sleep apnea) 09-06-2019  per pt lasted used cpap approx. 2019, stated felt he no longer needed    pt retested 11-13-2016 at [facility] w/ dr ather-- moderate to severe OSA , AHI 16.4/h/  03-24-2017    Pulmonary nodule followed by [facility]   per CT 06-11-2019  in Care Everywhere in epic done at [facility],  LUL nodule , not mets   Renal cyst, right    Restless legs syndrome (RLS)    S/P radiation therapy     for tonsillar cancer (head and neck) completed 01-26-2012 at [facility]);   and XRT for right lower leg sarcoma --- completed 06-06-2017   Saliva decreased    Sarcoma of lower extremity, right (HCC) oncolgoist-  dr Sonya Duster ([facility]) & Dr Porter Britain ([facility])   dx 02-14-2017 w/ needle core bx;  high grade pleomorphic spindle cell sarcoma , grade 2 (cT2N0M0); 03-15-2017  radical resection sarcoma tumor right lower leg  and completed radiation 06-06-2017   Self-catheterizes urinary bladder    QID   AND   PRN   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+) (Stage IVA)---- dx oct 2013  ----s/p left tonsillectomy and concurent chemo and  radiation/  ended 01-26-2012-- no surgical intervention---  residuals ( dry mouth, decreased saliva)   oncologist at [facility]--  dr brizel--  HX OF -- NO RECURRENCE   Urethral false passage 07/2019   Urethral stricture urologist--- dr Inga Manges   chronic---  post urethral dilation's   Urinary retention with incomplete bladder emptying    Vitamin B 12 deficiency 01/28/2011   Vitamin D  deficiency     Past Surgical History:  Procedure Laterality Date   BALLOON DILATION N/A 11/10/2014   Procedure: CYSTO BALLOON DILATION AND RETROGRADE URETHROGRAM ;  Surgeon: Erman Hayward, MD;  Location: [facility];  Service: Urology;  Laterality: N/A;   CATARACT EXTRACTION W/ INTRAOCULAR LENS  IMPLANT, BILATERAL  2012  approx   CYSTO/ BALLOON DILATION OF  URETHRAL STRICTURE  12-25-2010  CYSTOSCOPY WITH RETROGRADE URETHROGRAM N/A 03/30/2016   Procedure: CYSTOSCOPY WITH RETROGRADE URETHROGRAM AND BALLOON DILATION with cystogram;  Surgeon: Erman Hayward, MD;  Location: [facility];  Service: Urology;  Laterality: N/A;   CYSTOSCOPY WITH URETHRAL DILATATION  05/31/2011   Procedure: CYSTOSCOPY WITH URETHRAL DILATATION;  Surgeon: Devorah Fonder, MD;  Location[facility];  Service: Urology;  Laterality: N/A;  BALLOON DILATION   CYSTOSCOPY WITH URETHRAL DILATATION N/A 09/13/2016   Procedure: CYSTOSCOPY WITH URETHRAL BALLOON DILATATION;  Surgeon: Erman Hayward, MD;  Location: [facility];  Service: Urology;  Laterality: N/A;   CYSTOSCOPY WITH URETHRAL DILATATION N/A 03/30/2017   Procedure: CYSTOSCOPY WITH BALLOON URETHRAL DILATATION;  Surgeon: Erman Hayward, MD;  Location: [facility];  Service: Urology;  Laterality: N/A;   CYSTOSCOPY WITH URETHRAL DILATATION N/A 09/12/2019   Procedure: CYSTOSCOPY WITH URETHRAL DILATATION;  Surgeon: Homero Luster, MD;  Location: [facility];  Service: Urology;  Laterality: N/A;    CYSTOSCOPY/RETROGRADE/URETEROSCOPY N/A 05/22/2012   Procedure: CYSTOSCOPY BALLOON DILATION RETROGRADE URETEROGRAM ;  Surgeon: Devorah Fonder, MD;  Location: [facility];  Service: Urology;  Laterality: N/A;   CYTSO/  DILATATION URETHRAL STRICTURE/  BX PROSTATIC URETHRA/  REMOVAL FOREIGN BODIES  06-25-2010   [facility]   ECTROPION REPAIR Left 06/18/2019   lower eyelid   INGUINAL HERNIA REPAIR Right 2000   MOHS SURGERY  01/ 2019    [facility]   left lower leg for [facility]   MOHS SURGERY  12/25/2018   for [facility] of left lower eyelid;  the second stage Hughs flap release 01-09-2019   RADICAL RESECTION OF SARCOMA TUMOR   03-15-2017   [facility]   RIGHT LOWER LEG , CALF AREA   RADIOACTIVE SEED IMPLANTS, PROSTATE  JAN  2003   SKIN LESION EXCISION  07/2012   [facility]  right shoulder   TONSILLECTOMY Left 11-10-2011  @[facility]    tonsillar cancer   TRANSURETHRAL INCISION OF BLADDER NECK N/A 09/12/2019   Procedure: TRANSURETHRAL INCISION OF BLADDER NECK;  Surgeon: Homero Luster, MD;  Location[facility];  Service: Urology;  Laterality: N/A;    Allergies  Allergen Reactions   Cefixime Rash   Contrast Media [Iodinated Contrast Media] Other (See Comments)    Avoid due to renal disease   Lisinopril  Swelling    Swollen lower lip   Macrobid [Nitrofurantoin Macrocrystal] Swelling   Nsaids Other (See Comments)    Has been told no NSAIDs, Ibuprofen Renal insufficiency hx.    Sulfa Antibiotics    Cephalosporins Rash    Outpatient Encounter Medications as of 06/13/2023  Medication Sig   amLODipine  (NORVASC ) 5 MG tablet Take 0.5 tablets (2.5 mg total) by mouth 2 (two) times daily as needed (HTN). Give if bp >140   buPROPion  (WELLBUTRIN ) 100 MG tablet Take 1 tablet by mouth twice daily   Cholecalciferol  (VITAMIN D ) 50 MCG (2000 UT) CAPS Take by mouth every other day.   cyanocobalamin  (VITAMIN B12) 1000 MCG/ML injection INJECT 1ML INTRAMUSCULARLY FOR 14 DAYS   ketoconazole (NIZORAL) 2 % cream Apply 1  Application topically daily.   Multiple Vitamins-Minerals (PRESERVISION AREDS 2 PO) Take by mouth. daily   NP THYROID  120 MG tablet Take 90 mg by mouth daily.   SYRINGE-NEEDLE, DISP, 3 ML (B-D 3CC LUER-LOK SYR 25GX5/8") 25G X 5/8" 3 ML MISC USE FOR INJECTION EVERY 14 DAYS   temazepam  (RESTORIL ) 7.5 MG capsule Take 1 capsule (7.5 mg total) by mouth at  bedtime as needed for sleep.   No facility-administered encounter medications on file as of 06/13/2023.    Review of Systems:  Review of Systems  Constitutional:  Negative for activity change, appetite change and unexpected weight change.  HENT: Negative.    Eyes:  Positive for visual disturbance.  Respiratory:  Negative for cough and shortness of breath.   Cardiovascular:  Negative for leg swelling.  Gastrointestinal:  Negative for constipation.  Genitourinary:  Negative for frequency.  Musculoskeletal:  Negative for arthralgias, gait problem and myalgias.  Skin: Negative.  Negative for rash.  Neurological:  Positive for dizziness. Negative for weakness.  Psychiatric/Behavioral:  Positive for confusion, dysphoric mood and sleep disturbance.   All other systems reviewed and are negative.   Health Maintenance  Topic Date Due   Medicare Annual Wellness (AWV)  11/13/2020   COVID-19 Vaccine (8 - Moderna risk 2024-25 season) 11/17/2023 (Originally 04/19/2023)   Pneumonia Vaccine 61+ Years old  Completed   HPV VACCINES  Aged Out   Meningococcal B Vaccine  Aged Out   DTaP/Tdap/Td  Discontinued   INFLUENZA VACCINE  Discontinued   Zoster Vaccines- Shingrix  Discontinued    Physical Exam: Vitals:   06/13/23 0858  BP: 130/80  Pulse: 62  Resp: 20  Temp: (!) 97.5 F (36.4 C)  SpO2: 95%  Weight: 161 lb 9.6 oz (73.3 kg)  Height: 5\' 11"  (1.803 m)   Body mass index is 22.54 kg/m. Physical Exam Vitals reviewed.  Constitutional:      Appearance: Normal appearance.  HENT:     Head: Normocephalic.     Nose: Nose normal.      Mouth/Throat:     Mouth: Mucous membranes are moist.     Pharynx: Oropharynx is clear.  Eyes:     Pupils: Pupils are equal, round, and reactive to light.  Cardiovascular:     Rate and Rhythm: Normal rate and regular rhythm.     Pulses: Normal pulses.     Heart sounds: No murmur heard. Pulmonary:     Effort: Pulmonary effort is normal. No respiratory distress.     Breath sounds: Normal breath sounds. No rales.  Abdominal:     General: Abdomen is flat. Bowel sounds are normal.     Palpations: Abdomen is soft.  Musculoskeletal:        General: Swelling present.     Cervical back: Neck supple.  Skin:    General: Skin is warm.  Neurological:     General: No focal deficit present.     Mental Status: He is alert and oriented to person, place, and time.  Psychiatric:        Mood and Affect: Mood normal.        Thought Content: Thought content normal.     Labs reviewed: Basic Metabolic Panel: Recent Labs    02/04/23 2105 02/04/23 2128 03/21/23 0000 06/01/23 1019 06/05/23 0000 06/05/23 1555  NA 138 138 138 137  --  140  K 4.3 4.4 4.4 4.3  --  4.5  CL 103 105 100 99  --  104  CO2 24  --  27* 27  --  25*  GLUCOSE 102* 99  --  102*  --   --   BUN 35* 34* 32* 30*  --  28*  CREATININE 1.86* 2.00* 1.4* 1.51*  --  1.5*  CALCIUM 8.9  --  9.1 8.8* 8.2*  --   MG 2.2  --   --   --   --   --  Liver Function Tests: Recent Labs    02/04/23 2105 03/21/23 0000 06/01/23 1019  AST 15 19 23   ALT 15 20 17   ALKPHOS 54 76 80  BILITOT 0.9  --  0.3  PROT 6.3*  --  6.7  ALBUMIN 3.5 3.9 3.8   No results for input(s): "LIPASE", "AMYLASE" in the last 8760 hours. No results for input(s): "AMMONIA" in the last 8760 hours. CBC: Recent Labs    02/04/23 2105 02/04/23 2128 03/21/23 0000 06/01/23 1019  WBC 6.0  --  4.8 3.9*  NEUTROABS  --   --   --  2.6  HGB 12.7* 12.6* 12.3* 13.0  HCT 37.4* 37.0* 37* 37.3*  MCV 94.9  --   --  92.1  PLT 158  --  209 121*   Lipid Panel: Recent Labs     09/29/22 0000 03/21/23 0000  CHOL 231* 201*  HDL 62 72*  LDLCALC 152 113  TRIG 84 78   No results found for: "HGBA1C"  Procedures since last visit: MR BRAIN WO CONTRAST Result Date: 06/01/2023 CLINICAL DATA:  89 year old male with TIA, bad headache, confusion since last night. Unsteadiness. EXAM: MRI HEAD WITHOUT CONTRAST TECHNIQUE: Multiplanar, multiecho pulse sequences of the brain and surrounding structures were obtained without intravenous contrast. COMPARISON:  Head CT 02/04/2023. FINDINGS: Brain: No restricted diffusion to suggest acute infarction. No midline shift, mass effect, evidence of mass lesion, ventriculomegaly, extra-axial collection or acute intracranial hemorrhage. Cervicomedullary junction and pituitary are within normal limits. Fairly extensive superficial siderosis in the left cerebral hemisphere both the superior perirolandic area (series 12, image 49) and confluent along the lateral left temporal and occipital lobes. Additional scattered chronic microhemorrhages, also asymmetrically affecting the left hemisphere. Superimposed mild cortical and white matter T2 and FLAIR hyperintensity in the left lateral temporal lobe affected by siderosis (series 15, image 16). Mild if any gyral enlargement there. Other scattered cerebral white matter T2 and FLAIR hyperintensity appears more ordinary, such as related to chronic small vessel disease. Minimal cortical encephalomalacia also suspected including left occipital lobe series 11, image 20. No other convincing edema. Mild T2 heterogeneity in the deep gray nuclei. Brainstem and cerebellum relatively spared. Vascular: Major intracranial vascular flow voids are preserved. Distal right vertebral artery appears to be dominant. Mild intracranial artery tortuosity. Skull and upper cervical spine: Visualized bone marrow signal is within normal limits. Negative for age visible cervical spine. Sinuses/Orbits: Postoperative changes to both globes.  Mild paranasal sinus mucosal thickening. Other: Visible internal auditory structures appear normal. Mastoids are clear. Negative visible scalp and face. IMPRESSION: 1. Pronounced Superficial Siderosis in the left hemisphere with associated asymmetric chronic microhemorrhages in the brain, and evidence of mild superimposed left middle temporal gyrus edema. 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 2. No associated intracranial mass effect. No other acute intracranial abnormality. Superimposed more ordinary changes of chronic small vessel disease in the brain. Electronically Signed   By: Marlise Simpers M.D.   On: 06/01/2023 11:38    Assessment/Plan Assessment and Plan    Cognitive impairment and Dizziness with CAA on MRI  Has Appointment with Neurology Not on statin or aspirin  Hypertension Fluctuating blood pressure with elevated readings. Slight dizziness upon standing likely due to orthostatic changes. Goal to maintain safe blood pressure range. - Continue monitoring blood pressure at home. - Take 2.5 mg of antihypertensive medication twice daily. - Maintain blood pressure within a safe range.  Dizziness Slight dizziness upon standing attributed to  orthostatic hypotension. Managed with compression socks. - Continue wearing compression socks to manage orthostatic hypotension.  Age-related macular degeneration Vision affected, particularly reading and clarity. Under Dr. Evalene Hilda care with regular follow-ups. Concerned about progression. - Continue follow-up appointments with Dr. Seward Dao every five weeks. - Discuss vision changes and concerns with Dr. Seward Dao during the next appointment.  Goals of Care Advanced directives and power of attorney established. Capable of managing daily activities and finances with oversight plan if condition changes. - Ensure advanced directives and power of attorney are up to date. - Maintain regular communication  with family regarding health status and care preferences.     Primary insomnia Restoril  PRN Rarely uses it   Hypothyroidism due to acquired atrophy of thyroid  Follows with Dr Ronelle Coffee   B12 deficiency On IM injections   Major depression, recurrent, chronic (HCC) Wellbutrin  per Dr Levie Ream  Labs/tests ordered:  * No order type specified * Next appt:  07/03/2023

## 2023-06-26 ENCOUNTER — Ambulatory Visit (INDEPENDENT_AMBULATORY_CARE_PROVIDER_SITE_OTHER): Admitting: Neurology

## 2023-06-26 ENCOUNTER — Encounter: Payer: Self-pay | Admitting: Neurology

## 2023-06-26 VITALS — BP 150/98 | HR 60 | Ht 71.0 in | Wt 156.8 lb

## 2023-06-26 DIAGNOSIS — H548 Legal blindness, as defined in USA: Secondary | ICD-10-CM | POA: Insufficient documentation

## 2023-06-26 DIAGNOSIS — R29818 Other symptoms and signs involving the nervous system: Secondary | ICD-10-CM | POA: Insufficient documentation

## 2023-06-26 DIAGNOSIS — G9689 Other specified disorders of central nervous system: Secondary | ICD-10-CM | POA: Diagnosis not present

## 2023-06-26 DIAGNOSIS — H9193 Unspecified hearing loss, bilateral: Secondary | ICD-10-CM | POA: Insufficient documentation

## 2023-06-26 DIAGNOSIS — R4789 Other speech disturbances: Secondary | ICD-10-CM | POA: Diagnosis not present

## 2023-06-26 NOTE — Progress Notes (Signed)
 Provider:  Neomia Banner, MD  Primary Care Physician:  Marguerite Shiley, MD [address] [place] [address]     Referring Provider: Marguerite Shiley, Md [address] [place],  [place] [address]          Chief Complaint according to patient   Patient presents with:                HISTORY OF PRESENT ILLNESS:  Philip [name] is a 89 y.o. male patient [place] , who is here for revisit 06/26/2023 for  an abnormal MRI:  Chief concern according to patient :  He is concerned about  cognitive  slowing, word finding and motor slowing. He still plays table tennis. Macular degeneration has very much affected his ability to read but he doesn't fall !  He has good balance.  He is very much up to date on daily news and he has been  a compliant sure of CPAP, he had completed a PT regimen.  The MRI is not of major concern- there is superficial siderosis.  This can be related to very remote  trauma and there is no primary intervention for this. No new strokes, no bleeds are seen , this brain is not even atrophied.  He reports subjective  memory loss , decreasing ability to read and this has been frustrating to him, its straining his residual visual capacity to work with a magnifying glass.  He wants to start more audio books.  His hearing is acute, his vision extremely poor.  Legally blind,  with a small window of visual field left .   He accepted that testing for amyloid disease or disorder at his stage of life is unlikely to derive any benefit.        06-02-2023: The patient is an 89 year old with chronic dizziness and gait instability who presents with weakness and low blood pressure.   [facility] is being [place] [facility] [place].  The nurse reports his systolic bp was in the 70-80 range and his daughter was concerned for weakness. Once in bed and supine I rechecked his bp manually and it was 110/70.   He has  experienced slight dizziness over the past 12 months, describing it as 'slight most of the time'. A recent episode of severe 'wobbliness' occurred on Wednesday night, but he has not fallen despite balance issues. He visited the emergency room yesterday due to a bad headache and dizziness, where an MRI was performed, superficial siderosis in the left hemisphere with associated chronic microhemorrhages in the brain and evidence of cerebral amyloid angiopathy. He reports he has had 3 other MRIs that show no acute findings ( I could not locate).    He was treated for a urinary tract infection with fosfomycin after bacteria was found in his urine, although he reports no symptoms such as burning during urination or bladder pain. The final urine culture showed no growth. He catheterizes himself four to five times a day due to bph and retention.  Troponin T was initially 36 then came down to 30.  EKG showed NSR no ST abnormalities, WBC 3.9 Plt 121 Hgb 13. BUN 30 Cr 1.51   He has a history of labile blood pressure. He took his blood pressure medication this morning, Norvasc  (amlodipine ), and thyroid  medication this morning but has not taken his eye medication. No chest pain, trouble  breathing, fever, or chills. He has experienced memory problems recently. No further headache noted.    He eats breakfast and lunch regularly at a bistro and has been eating two eggs a day, noting a recent change in the strength of the eggshells. He has a history of B12 deficiency and insomnia, for which he occasionally takes temazepam . He is on NP thyroid  at 90 mg and Wellbutrin  for depression. He has lost about 12 pounds over the last year and has a history of lymphedema on the left leg, which improved with reflexology. He uses a CPAP machine for sleep apnea.     Other pertinent information : Sarcoma of the RLE s/p XRT followed by [facility]. Lymphedema of the left leg Tonsillar ca CKD Prostate Ca Sees Dr Henrine Logan due to hx of aortitis  syndrome.  Aneurysm of ascending aorta monitoring see by Dr Lorie Rook He has been reporting fatigue and insomnia for a few years.  Sleep apnea on CPAP Saw Cardiology in 2021 and then 03/30/22 for fatigue with exertion and had a work up and  has a dilated Aorta, needs MRA monitoring.  PTSD  Left renal artery stenosis .     Review of Systems: Out of a complete 14 system review, the patient complains of only the following symptoms, and all other reviewed systems are negative.:   Social History   Socioeconomic History   Marital status: Widowed    Spouse name: Not on file   Number of children: 4   Years of education: college   Highest education level: Not on file  Occupational History   Occupation: retired  Tobacco Use   Smoking status: Never   Smokeless tobacco: Never  Vaping Use   Vaping status: Never Used  Substance and Sexual Activity   Alcohol use: Yes    Alcohol/week: 7.0 standard drinks of alcohol    Types: 7 Glasses of wine per week    Comment: 8oz, glass or red wine for dinner   Drug use: No   Sexual activity: Not on file  Other Topics Concern   Not on file  Social History Narrative   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    Right handed   Social Drivers of Health   Financial Resource Strain: Low Risk  (08/08/2017)   Overall Financial Resource Strain (CARDIA)    Difficulty of Paying Living Expenses: Not hard at all  Food Insecurity: No Food Insecurity (03/30/2022)   Hunger Vital Sign    Worried About Running Out of Food in the Last Year: Never true    Ran Out of Food in the Last Year: Never true  Transportation Needs: No Transportation Needs (03/30/2022)   PRAPARE - Administrator, Civil Service (Medical): No    Lack of Transportation (Non-Medical): No  Physical Activity: Inactive (03/30/2022)   Exercise Vital Sign    Days of Exercise per Week: 0 days     Minutes of Exercise per Session: 0 min  Stress: No Stress Concern Present (08/08/2017)   [employer] of Occupational Health - Occupational Stress Questionnaire    Feeling of Stress : Only a little  Social Connections: Somewhat Isolated (08/08/2017)   Social Connection and Isolation Panel [NHANES]    Frequency of Communication with Friends and Family: More than three times a week    Frequency of Social Gatherings with Friends and Family:  More than three times a week    Attends Religious Services: Never    Active Member of Clubs or Organizations: No    Attends Banker Meetings: Never    Marital Status: Married    Family History  Problem Relation Age of Onset   Stroke Father     Past Medical History:  Diagnosis Date   Age-related macular degeneration, dry, both eyes    Aortitis syndrome (HCC) rheumotologist-  dr syed   IgG4 syndrome--  (effects abdomine) ,  previously long term use prednisone  last taken 07/ 2021   Basal cell carcinoma (BCC) of eye    CKD (chronic kidney disease), stage III [facility])    nephrologist-- dr Zachery Hermes. Christianne Cowper   Complication of anesthesia    post op acute urinary retention   DDD (degenerative disc disease)    Fatigue    pt evalulation by cardiology--- dr Baldomero Levans, note 05-21-2019 in epic,(non cardiac)  normal echo and event monitor showed extra beats of atrial tachycardia , no atrial fib, and rare ecotpy   Full dentures    GERD (gastroesophageal reflux disease)    History of basal cell carcinoma (BCC) excision    12-25-2018 MOH's reconstruction left lower eyelid   History of external beam radiation therapy 2002   prostate cancer  and boost with radioative prostate seed implants   History of prostate cancer DX  2002   S/P EXTERNAL RADIATION/ RADIOACTIVE SEED IMPLANTS  2003   History of squamous cell carcinoma excision    2012--  RIGHT LOWER EXTREM;  2019 LEFT LOWER EXTREMITIY   HTN (hypertension)    followed by pcp   Hyperlipidemia     Hypothyroidism    followed by pcp--- acquired atrophy due to radiation;  previously seen by endocrinologist-  dr Ronelle Coffee--- per pt takes his own thyroid  med. called NP Thyroid  90 mg daily, does not take synthroid    Large vessel vasculitis (HCC)    rheumotologist-  dr syed --- IgG4 related aoritis involving renal arteries ,  pt treated with predisone.  (09-06-2019  per pt no longer prednisone  last taken one month ago 07/ 2021, weaned off by pcp per pt request)   Left renal artery stenosis [facility])    nephrologist--- dr Christianne Cowper--- with aneurysm proximal    Lymphedema of left leg    followed by [facility]---- pt stated uses compression hose   OSA (obstructive sleep apnea) 09-06-2019  per pt lasted used cpap approx. 2019, stated felt he no longer needed    pt retested 11-13-2016 at [facility] w/ dr ather-- moderate to severe OSA , AHI 16.4/h/  03-24-2017    Pulmonary nodule followed by [facility]   per CT 06-11-2019  in Care Everywhere in epic done at [facility],  LUL nodule , not mets   Renal cyst, right    Restless legs syndrome (RLS)    S/P radiation therapy     for tonsillar cancer (head and neck) completed 01-26-2012 at [facility]);   and XRT for right lower leg sarcoma --- completed 06-06-2017   Saliva decreased    Sarcoma of lower extremity, right (HCC) oncolgoist-  dr Sonya Duster ([facility]) & Dr Dyana Glade. 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+) (Stage IVA)---- dx oct 2013  ----s/p left tonsillectomy and concurent chemo and radiation/  ended 01-26-2012-- no surgical intervention---  residuals ( dry mouth, decreased saliva)   oncologist at [facility]--  dr brizel--  HX  OF -- NO RECURRENCE   Urethral false passage 07/2019   Urethral stricture urologist--- dr Inga Manges   chronic---  post urethral dilation's   Urinary retention with incomplete bladder emptying    Vitamin B 12 deficiency 01/28/2011   Vitamin D  deficiency     Past Surgical History:  Procedure Laterality Date   BALLOON DILATION N/A 11/10/2014   Procedure: CYSTO BALLOON DILATION AND RETROGRADE URETHROGRAM ;  Surgeon: Erman Hayward, MD;  Location: [facility];  Service: Urology;  Laterality: N/A;   CATARACT EXTRACTION W/ INTRAOCULAR LENS  IMPLANT, BILATERAL  2012  approx   CYSTO/ BALLOON DILATION OF  URETHRAL STRICTURE  12-25-2010   CYSTOSCOPY WITH RETROGRADE URETHROGRAM N/A 03/30/2016   Procedure: CYSTOSCOPY WITH RETROGRADE URETHROGRAM AND BALLOON DILATION with cystogram;  Surgeon: Erman Hayward, MD;  Location: [facility];  Service: Urology;  Laterality: N/A;   CYSTOSCOPY WITH URETHRAL DILATATION  05/31/2011   Procedure: CYSTOSCOPY WITH URETHRAL DILATATION;  Surgeon: Devorah Fonder, MD;  Location: [facility];  Service: Urology;  Laterality: N/A;  BALLOON DILATION   CYSTOSCOPY WITH URETHRAL DILATATION N/A 09/13/2016   Procedure: CYSTOSCOPY WITH URETHRAL BALLOON DILATATION;  Surgeon: Erman Hayward, MD;  Location: [facility] [place];  Service: Urology;  Laterality: N/A;   CYSTOSCOPY WITH URETHRAL DILATATION N/A 03/30/2017   Procedure: CYSTOSCOPY WITH BALLOON URETHRAL DILATATION;  Surgeon: Erman Hayward, MD;  Location: [facility];  Service: Urology;  Laterality: N/A;   CYSTOSCOPY WITH URETHRAL DILATATION N/A 09/12/2019   Procedure: CYSTOSCOPY WITH URETHRAL DILATATION;  Surgeon: Homero Luster, MD;  Location: [facility];  Service: Urology;  Laterality: N/A;   CYSTOSCOPY/RETROGRADE/URETEROSCOPY N/A 05/22/2012   Procedure: CYSTOSCOPY BALLOON DILATION RETROGRADE URETEROGRAM ;  Surgeon: Devorah Fonder, MD;   Location: [facility];  Service: Urology;  Laterality: N/A;   CYTSO/  DILATATION URETHRAL STRICTURE/  BX PROSTATIC URETHRA/  REMOVAL FOREIGN BODIES  06-25-2010   [facility]   ECTROPION REPAIR Left 06/18/2019   lower eyelid   INGUINAL HERNIA REPAIR Right 2000   MOHS SURGERY  01/ 2019    [facility]   left lower leg for [facility]   MOHS SURGERY  12/25/2018   for [facility] of left lower eyelid;  the second stage Hughs flap release 01-09-2019   RADICAL RESECTION OF SARCOMA TUMOR   03-15-2017   [facility]   RIGHT LOWER LEG , CALF AREA   RADIOACTIVE SEED IMPLANTS, PROSTATE  JAN  2003   SKIN LESION EXCISION  07/2012   MOST  right shoulder   TONSILLECTOMY Left 11-10-2011  @[facility]    tonsillar cancer   TRANSURETHRAL INCISION OF BLADDER NECK N/A 09/12/2019   Procedure: TRANSURETHRAL INCISION OF BLADDER NECK;  Surgeon: Homero Luster, MD;  Location: [facility];  Service: Urology;  Laterality: N/A;     Current Outpatient Medications on File Prior to Visit  Medication Sig Dispense Refill   amLODipine  (NORVASC ) 5 MG tablet Take 0.5 tablets (2.5 mg total) by mouth 2 (two) times daily as needed (HTN). Give if bp >140     buPROPion  (WELLBUTRIN ) 100 MG tablet Take 1 tablet by mouth twice daily (Patient taking differently: Take  100 mg by mouth 3 (three) times daily.) 180 tablet 0   Cholecalciferol  (VITAMIN D ) 50 MCG (2000 UT) CAPS Take by mouth every other day.     cyanocobalamin  (VITAMIN B12) 1000 MCG/ML injection INJECT 1ML INTRAMUSCULARLY FOR 14 DAYS 6 mL 1   Multiple Vitamins-Minerals (PRESERVISION AREDS 2 PO) Take by mouth. daily     NP THYROID  120 MG tablet Take 90 mg by mouth daily.     SYRINGE-NEEDLE, DISP, 3 ML (B-D 3CC LUER-LOK SYR 25GX5/8") 25G X 5/8" 3 ML MISC USE FOR INJECTION EVERY 14 DAYS 50 each 1   ketoconazole (NIZORAL) 2 % cream Apply 1 Application topically daily. (Patient not taking: Reported on 06/26/2023)     temazepam  (RESTORIL ) 7.5 MG capsule Take 1 capsule (7.5 mg total) by mouth at  bedtime as needed for sleep. (Patient not taking: Reported on 06/26/2023) 30 capsule 0   No current facility-administered medications on file prior to visit.    Allergies  Allergen Reactions   Cefixime Rash   Contrast Media [Iodinated Contrast Media] Other (See Comments)    Avoid due to renal disease   Lisinopril  Swelling    Swollen lower lip   Macrobid [Nitrofurantoin Macrocrystal] Swelling   Nsaids Other (See Comments)    Has been told no NSAIDs, Ibuprofen Renal insufficiency hx.    Sulfa Antibiotics    Cephalosporins Rash     DIAGNOSTIC DATA (LABS, IMAGING, TESTING) - I reviewed patient records, labs, notes, testing and imaging myself where available.    MRI brain left pronounced superficial siderosis.   This is most often seen after a SAH/ SDH.  Here also micro-hemorrhages. Amyloid angiopathy ?    Lab Results  Component Value Date   WBC 3.9 (L) 06/01/2023   HGB 13.0 06/01/2023   HCT 37.3 (L) 06/01/2023   MCV 92.1 06/01/2023   PLT 121 (L) 06/01/2023      Component Value Date/Time   NA 140 06/05/2023 1555   K 4.5 06/05/2023 1555   CL 104 06/05/2023 1555   CO2 25 (A) 06/05/2023 1555   GLUCOSE 102 (H) 06/01/2023 1019   BUN 28 (A) 06/05/2023 1555   CREATININE 1.5 (A) 06/05/2023 1555   CREATININE 1.51 (H) 06/01/2023 1019   CREATININE 1.72 (H) 05/13/2022 1502   CALCIUM 8.2 (A) 06/05/2023 0000   PROT 6.7 06/01/2023 1019   ALBUMIN 3.8 06/01/2023 1019   AST 23 06/01/2023 1019   ALT 17 06/01/2023 1019   ALKPHOS 80 06/01/2023 1019   BILITOT 0.3 06/01/2023 1019   GFRNONAA 44 (L) 06/01/2023 1019   GFRAA 40 (L) 08/24/2017 2136   Lab Results  Component Value Date   CHOL 201 (A) 03/21/2023   HDL 72 (A) 03/21/2023   LDLCALC 113 03/21/2023   TRIG 78 03/21/2023   No results found for: "HGBA1C" Lab Results  Component Value Date   VITAMINB12 858 03/10/2022   Lab Results  Component Value Date   TSH 0.97 07/29/2021    PHYSICAL EXAM:  Today's Vitals   06/26/23  1542  BP: (!) 150/98  Pulse: 60  Weight: 156 lb 12.8 oz (71.1 kg)  Height: 5\' 11"  (1.803 m)   Body mass index is 21.87 kg/m.   Wt Readings from Last 3 Encounters:  06/26/23 156 lb 12.8 oz (71.1 kg)  06/13/23 161 lb 9.6 oz (73.3 kg)  06/01/23 152 lb (68.9 kg)     Ht Readings from Last 3 Encounters:  06/26/23 5\' 11"  (1.803 m)  06/13/23 5\' 11"  (  1.803 m)  05/22/23 5\' 11"  (1.803 m)      General: The patient is awake, alert and appears not in acute distress. The patient is well groomed. Head: Normocephalic, atraumatic. Neck is supple. Mallampati 2,  neck circumference:15 inches . Nasal airflow patent.  Retrognathia is not seen.  Dental status: deferred.    Cardiovascular:  Regular rate and cardiac rhythm by pulse,  without distended neck veins. Respiratory: Lungs are clear to auscultation.  Skin:  With  evidence of severe left ankle edema.  Neurologic exam : The patient is awake and alert, oriented to place and time.   Memory subjective described as intact.  Attention span & concentration ability appears normal.  Speech is fluent,  without  dysarthria, dysphonia or aphasia.  Mood and affect are appropriate.   Cranial nerves: no loss of smell or taste reported  Pupils are not equal  in size, and slowly  reactive to light. Funduscopic exam deferred. .  Visual fields by finger perimetry are very limited  to a central focal point,  Hearing was intact to soft voice .    Facial sensation intact to fine touch.  Facial motor strength is symmetric and tongue and uvula move midline.  Neck ROM : rotation, tilt and flexion extension were normal for age and shoulder shrug was symmetrical.    Motor exam:  Symmetric bulk, tone and ROM.   Normal tone without cog -wheeling, symmetric grip strength .   Sensory:  Fine touch, pinprick and vibration were tested  and  normal, except left ankle.   Proprioception tested in the upper extremities was normal.   Coordination: Rapid alternating  movements in the fingers/hands were of normal speed.    DTR symmetric at patella,  in spite  ankle and knee swelling.   ASSESSMENT AND PLAN:  89 y.o. year old male  here with:  Concern of mild memory impairment in the setting of sensory deprivation/ impaired vision and hearing.     1)  legally blind  2)  having superficial siderosis of the left > right brain, suspected amyloid angiopathy.   3) losing hearing.   I will refer to audiology and encourage stimulation by audio books, radio shows etc.  I offered ATN test and apolipoprotein panel.  I asked him to no use ASA, or plavix.     Prn RV .   I would like to thank  Marguerite Shiley, Md [address] [place],  [place] [address] for allowing me to meet with and to take care of this pleasant patient.     After spending a total time of  39  minutes face to face and additional time for physical and neurologic examination, review of laboratory studies,  personal review of imaging studies, reports and results of other testing and review of referral information / records as far as provided in visit,   Electronically signed by: Neomia Banner, MD 06/26/2023 4:04 PM  [facility] Board certified by The [employer] of Sleep Medicine and Diplomate of the [employer] of Sleep Medicine. Board certified In Neurology through the ABPN, Fellow of the [employer] of Neurology.

## 2023-06-26 NOTE — Addendum Note (Signed)
 Addended by: Neomia Banner on: 06/26/2023 04:54 PM   Modules accepted: Orders

## 2023-06-27 ENCOUNTER — Telehealth: Payer: Self-pay | Admitting: Neurology

## 2023-06-27 LAB — BASIC METABOLIC PANEL WITH GFR
BUN: 26 — AB (ref 4–21)
CO2: 23 — AB (ref 13–22)
Chloride: 103 (ref 99–108)
Creatinine: 1.4 — AB (ref 0.6–1.3)
Glucose: 93
Potassium: 4.5 meq/L (ref 3.5–5.1)
Sodium: 140 (ref 137–147)

## 2023-06-27 LAB — CBC AND DIFFERENTIAL
HCT: 40 — AB (ref 41–53)
Hemoglobin: 13.3 — AB (ref 13.5–17.5)
Platelets: 178 10*3/uL (ref 150–400)
WBC: 5.4

## 2023-06-27 LAB — COMPREHENSIVE METABOLIC PANEL WITH GFR
Calcium: 8.9 (ref 8.7–10.7)
eGFR: 35

## 2023-06-27 LAB — CBC: RBC: 4.2 (ref 3.87–5.11)

## 2023-06-27 NOTE — Telephone Encounter (Signed)
 Referral for audiology email to [facility]. Phone: [phone], Fax: [phone]. Due to fax number not working emailed secure to [email or web address]

## 2023-06-29 ENCOUNTER — Ambulatory Visit: Payer: Self-pay | Admitting: Neurology

## 2023-07-03 ENCOUNTER — Encounter: Admitting: Adult Health

## 2023-07-03 ENCOUNTER — Non-Acute Institutional Stay: Admitting: Adult Health

## 2023-07-04 ENCOUNTER — Encounter: Payer: Self-pay | Admitting: Internal Medicine

## 2023-07-04 ENCOUNTER — Non-Acute Institutional Stay: Admitting: Internal Medicine

## 2023-07-04 VITALS — BP 110/60 | HR 65 | Temp 97.9°F | Ht 71.0 in | Wt 155.0 lb

## 2023-07-04 DIAGNOSIS — E854 Organ-limited amyloidosis: Secondary | ICD-10-CM | POA: Diagnosis not present

## 2023-07-04 DIAGNOSIS — R42 Dizziness and giddiness: Secondary | ICD-10-CM

## 2023-07-04 DIAGNOSIS — N390 Urinary tract infection, site not specified: Secondary | ICD-10-CM | POA: Diagnosis not present

## 2023-07-04 DIAGNOSIS — I10 Essential (primary) hypertension: Secondary | ICD-10-CM | POA: Diagnosis not present

## 2023-07-04 DIAGNOSIS — I680 Cerebral amyloid angiopathy: Secondary | ICD-10-CM

## 2023-07-04 MED ORDER — PRESERVISION AREDS 2 PO CAPS: 1.0000 | ORAL_CAPSULE | Freq: Two times a day (BID) | ORAL | Status: DC

## 2023-07-04 NOTE — Progress Notes (Signed)
 This encounter was created in error - please disregard.

## 2023-07-04 NOTE — Progress Notes (Unsigned)
 Location:  [facility] features editor of Service:  Clinic (12)  Provider:   Code Status: *** Goals of Care:     06/01/2023   10:09 AM  Advanced Directives  Does Patient Have a Medical Advance Directive? Yes     Chief Complaint  Patient presents with   Follow-up    6 week follow up. Patient has concerns about UTI    HPI: Patient is a 89 y.o. male seen today for medical management of chronic diseases.     Past Medical History:  Diagnosis Date   Age-related macular degeneration, dry, both eyes    Aortitis syndrome (HCC) rheumotologist-  dr syed   IgG4 syndrome--  (effects abdomine) ,  previously long term use prednisone  last taken 07/ 2021   Basal cell carcinoma (BCC) of eye    CKD (chronic kidney disease), stage III [facility])    nephrologist-- dr Zachery Hermes. Christianne Cowper   Complication of anesthesia    post op acute urinary retention   DDD (degenerative disc disease)    Fatigue    pt evalulation by cardiology--- dr Baldomero Levans, note 05-21-2019 in epic,(non cardiac)  normal echo and event monitor showed extra beats of atrial tachycardia , no atrial fib, and rare ecotpy   Full dentures    GERD (gastroesophageal reflux disease)    History of basal cell carcinoma (BCC) excision    12-25-2018 MOH's reconstruction left lower eyelid   History of external beam radiation therapy 2002   prostate cancer  and boost with radioative prostate seed implants   History of prostate cancer DX  2002   S/P EXTERNAL RADIATION/ RADIOACTIVE SEED IMPLANTS  2003   History of squamous cell carcinoma excision    2012--  RIGHT LOWER EXTREM;  2019 LEFT LOWER EXTREMITIY   HTN (hypertension)    followed by pcp   Hyperlipidemia    Hypothyroidism    followed by pcp--- acquired atrophy due to radiation;  previously seen by endocrinologist-  dr Ronelle Coffee--- per pt takes his own thyroid  med. called NP Thyroid  90 mg daily, does not take synthroid    Large vessel vasculitis (HCC)    rheumotologist-  dr syed ---  IgG4 related aoritis involving renal arteries ,  pt treated with predisone.  (09-06-2019  per pt no longer prednisone  last taken one month ago 07/ 2021, weaned off by pcp per pt request)   Left renal artery stenosis [facility])    nephrologist--- dr Christianne Cowper--- with aneurysm proximal    Lymphedema of left leg    followed by [facility]---- pt stated uses compression hose   OSA (obstructive sleep apnea) 09-06-2019  per pt lasted used cpap approx. 2019, stated felt he no longer needed    pt retested 11-13-2016 at [facility] w/ dr ather-- moderate to severe OSA , AHI 16.4/h/  03-24-2017    Pulmonary nodule followed by [facility]   per CT 06-11-2019  in Care Everywhere in epic done at [facility],  LUL nodule , not mets   Renal cyst, right    Restless legs syndrome (RLS)    S/P radiation therapy     for tonsillar cancer (head and neck) completed 01-26-2012 at [facility]);   and XRT for right lower leg sarcoma --- completed 06-06-2017   Saliva decreased    Sarcoma of lower extremity, right (HCC) oncolgoist-  dr Sonya Duster ([facility]) & Dr Dyana Glade. Lolly Riser ([facility])   dx 02-14-2017 w/ needle core bx;  high grade pleomorphic spindle  cell sarcoma , grade 2 (cT2N0M0); 03-15-2017  radical resection sarcoma tumor right lower leg  and completed radiation 06-06-2017   Self-catheterizes urinary bladder    QID   AND   PRN   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+) (Stage IVA)---- dx oct 2013  ----s/p left tonsillectomy and concurent chemo and radiation/  ended 01-26-2012-- no surgical intervention---  residuals ( dry mouth, decreased saliva)   oncologist at [facility]--  dr brizel--  HX OF -- NO RECURRENCE   Urethral false passage 07/2019   Urethral stricture urologist--- dr Inga Manges   chronic---  post urethral dilation's   Urinary retention with incomplete bladder emptying    Vitamin B 12 deficiency 01/28/2011   Vitamin D  deficiency     Past Surgical  History:  Procedure Laterality Date   BALLOON DILATION N/A 11/10/2014   Procedure: CYSTO BALLOON DILATION AND RETROGRADE URETHROGRAM ;  Surgeon: Erman Hayward, MD;  Location: [facility];  Service: Urology;  Laterality: N/A;   CATARACT EXTRACTION W/ INTRAOCULAR LENS  IMPLANT, BILATERAL  2012  approx   CYSTO/ BALLOON DILATION OF  URETHRAL STRICTURE  12-25-2010   CYSTOSCOPY WITH RETROGRADE URETHROGRAM N/A 03/30/2016   Procedure: CYSTOSCOPY WITH RETROGRADE URETHROGRAM AND BALLOON DILATION with cystogram;  Surgeon: Erman Hayward, MD;  Location: [facility];  Service: Urology;  Laterality: N/A;   CYSTOSCOPY WITH URETHRAL DILATATION  05/31/2011   Procedure: CYSTOSCOPY WITH URETHRAL DILATATION;  Surgeon: Devorah Fonder, MD;  Location: [facility];  Service: Urology;  Laterality: N/A;  BALLOON DILATION   CYSTOSCOPY WITH URETHRAL DILATATION N/A 09/13/2016   Procedure: CYSTOSCOPY WITH URETHRAL BALLOON DILATATION;  Surgeon: Erman Hayward, MD;  Location: [facility];  Service: Urology;  Laterality: N/A;   CYSTOSCOPY WITH URETHRAL DILATATION N/A 03/30/2017   Procedure: CYSTOSCOPY WITH BALLOON URETHRAL DILATATION;  Surgeon: Erman Hayward, MD;  Location: [facility] [place];  Service: Urology;  Laterality: N/A;   CYSTOSCOPY WITH URETHRAL DILATATION N/A 09/12/2019   Procedure: CYSTOSCOPY WITH URETHRAL DILATATION;  Surgeon: Homero Luster, MD;  Location: [facility];  Service: Urology;  Laterality: N/A;   CYSTOSCOPY/RETROGRADE/URETEROSCOPY N/A 05/22/2012   Procedure: CYSTOSCOPY BALLOON DILATION RETROGRADE URETEROGRAM ;  Surgeon: Devorah Fonder, MD;  Location: [facility];  Service: Urology;  Laterality: N/A;   CYTSO/  DILATATION URETHRAL STRICTURE/  BX PROSTATIC URETHRA/  REMOVAL FOREIGN BODIES  06-25-2010   [facility]   ECTROPION REPAIR Left 06/18/2019   lower eyelid   INGUINAL HERNIA REPAIR Right 2000   MOHS  SURGERY  01/ 2019    [facility]   left lower leg for [facility]   MOHS SURGERY  12/25/2018   for [facility] of left lower eyelid;  the second stage Hughs flap release 01-09-2019   RADICAL RESECTION OF SARCOMA TUMOR   03-15-2017   [facility]   RIGHT LOWER LEG , CALF AREA   RADIOACTIVE SEED IMPLANTS, PROSTATE  JAN  2003   SKIN LESION EXCISION  07/2012   MOST  right shoulder   TONSILLECTOMY Left 11-10-2011  @[facility]    tonsillar cancer   TRANSURETHRAL INCISION OF BLADDER NECK N/A 09/12/2019   Procedure: TRANSURETHRAL INCISION OF BLADDER NECK;  Surgeon: Homero Luster, MD;  Location: [facility] ;  Service: Urology;  Laterality: N/A;    Allergies  Allergen Reactions   Cefixime Rash   Contrast Media [Iodinated Contrast Media] Other (See Comments)    Avoid due  to renal disease   Lisinopril  Swelling    Swollen lower lip   Macrobid [Nitrofurantoin Macrocrystal] Swelling   Nsaids Other (See Comments)    Has been told no NSAIDs, Ibuprofen Renal insufficiency hx.    Sulfa Antibiotics    Cephalosporins Rash    Outpatient Encounter Medications as of 07/04/2023  Medication Sig   amLODipine  (NORVASC ) 5 MG tablet Take 0.5 tablets (2.5 mg total) by mouth 2 (two) times daily as needed (HTN). Give if bp >140   buPROPion  (WELLBUTRIN ) 100 MG tablet Take 1 tablet by mouth twice daily (Patient taking differently: Take 100 mg by mouth 3 (three) times daily.)   Cholecalciferol  (VITAMIN D ) 50 MCG (2000 UT) CAPS Take by mouth every other day.   cyanocobalamin  (VITAMIN B12) 1000 MCG/ML injection INJECT 1ML INTRAMUSCULARLY FOR 14 DAYS   NP THYROID  120 MG tablet Take 90 mg by mouth daily.   SYRINGE-NEEDLE, DISP, 3 ML (B-D 3CC LUER-LOK SYR 25GX5/8) 25G X 5/8 3 ML MISC USE FOR INJECTION EVERY 14 DAYS   ketoconazole (NIZORAL) 2 % cream Apply 1 Application topically daily. (Patient not taking: Reported on 07/04/2023)   Multiple Vitamins-Minerals (PRESERVISION AREDS 2) CAPS Take 1 capsule by mouth 2 (two) times daily. daily    temazepam  (RESTORIL ) 7.5 MG capsule Take 1 capsule (7.5 mg total) by mouth at bedtime as needed for sleep. (Patient not taking: Reported on 07/04/2023)   [DISCONTINUED] Multiple Vitamins-Minerals (PRESERVISION AREDS 2 PO) Take by mouth. daily (Patient not taking: Reported on 07/04/2023)   No facility-administered encounter medications on file as of 07/04/2023.    Review of Systems:  Review of Systems  Health Maintenance  Topic Date Due   Medicare Annual Wellness (AWV)  11/13/2020   COVID-19 Vaccine (8 - Moderna risk 2024-25 season) 11/17/2023 (Originally 04/19/2023)   Pneumococcal Vaccine: 50+ Years  Completed   HPV VACCINES  Aged Out   Meningococcal B Vaccine  Aged Out   DTaP/Tdap/Td  Discontinued   INFLUENZA VACCINE  Discontinued   Zoster Vaccines- Shingrix  Discontinued    Physical Exam: Vitals:   07/04/23 1443  BP: 110/60  Pulse: 65  Temp: 97.9 F (36.6 C)  SpO2: 98%  Weight: 155 lb (70.3 kg)  Height: 5' 11 (1.803 m)   Body mass index is 21.62 kg/m. Physical Exam  Labs reviewed: Basic Metabolic Panel: Recent Labs    02/04/23 2105 02/04/23 2128 03/21/23 0000 06/01/23 1019 06/05/23 0000 06/05/23 1555 06/27/23 0000 06/27/23 1000  NA 138 138   < > 137  --  140  --  140  K 4.3 4.4   < > 4.3  --  4.5  --  4.5  CL 103 105   < > 99  --  104  --  103  CO2 24  --    < > 27  --  25*  --  23*  GLUCOSE 102* 99  --  102*  --   --   --   --   BUN 35* 34*   < > 30*  --  28*  --  26*  CREATININE 1.86* 2.00*   < > 1.51*  --  1.5*  --  1.4*  CALCIUM 8.9  --    < > 8.8* 8.2*  --  8.9  --   MG 2.2  --   --   --   --   --   --   --    < > = values in this interval  not displayed.   Liver Function Tests: Recent Labs    02/04/23 2105 03/21/23 0000 06/01/23 1019  AST 15 19 23   ALT 15 20 17   ALKPHOS 54 76 80  BILITOT 0.9  --  0.3  PROT 6.3*  --  6.7  ALBUMIN 3.5 3.9 3.8   No results for input(s): LIPASE, AMYLASE in the last 8760 hours. No results for input(s):  AMMONIA in the last 8760 hours. CBC: Recent Labs    02/04/23 2105 02/04/23 2128 03/21/23 0000 06/01/23 1019 06/27/23 0000  WBC 6.0  --  4.8 3.9* 5.4  NEUTROABS  --   --   --  2.6  --   HGB 12.7*   < > 12.3* 13.0 13.3*  HCT 37.4*   < > 37* 37.3* 40*  MCV 94.9  --   --  92.1  --   PLT 158  --  209 121* 178   < > = values in this interval not displayed.   Lipid Panel: Recent Labs    09/29/22 0000 03/21/23 0000  CHOL 231* 201*  HDL 62 72*  LDLCALC 152 113  TRIG 84 78   No results found for: HGBA1C  Procedures since last visit: No results found.  Assessment/Plan 1. Recurrent UTI (Primary) ***  2. Essential hypertension ***  3. Cerebral amyloid angiopathy (HCC) ***  4. Dizziness ***    Labs/tests ordered:  * No order type specified * Next appt:  10/09/2023

## 2023-07-05 NOTE — Telephone Encounter (Signed)
 Cld pt to give test results. No answer, LVM of test results (on DPR) and ofc number for Pt to call if questions.

## 2023-07-05 NOTE — Telephone Encounter (Signed)
-----   Message from [place] Dohmeier sent at 06/29/2023  5:49 PM EDT ----- Biomarkers for AD are not positive.  ----- Message ----- From: Interface, [facility] Lab Results In Sent: 06/27/2023   9:36 AM EDT To: Neomia Banner, MD

## 2023-07-07 LAB — ATN PROFILE
A -- Beta-amyloid 42/40 Ratio: 0.104 (ref >0.102)
Beta-amyloid 40: 267.08 pg/mL
Beta-amyloid 42: 27.85 pg/mL
N -- NfL, Plasma: 7.34 pg/mL (ref 0.00–9.13)
T -- p-tau181: 2.49 pg/mL — ABNORMAL HIGH (ref 0.00–0.97)

## 2023-07-07 LAB — APOE ALZHEIMER'S RISK

## 2023-07-18 ENCOUNTER — Other Ambulatory Visit: Payer: Self-pay | Admitting: Internal Medicine

## 2023-07-18 DIAGNOSIS — F3341 Major depressive disorder, recurrent, in partial remission: Secondary | ICD-10-CM

## 2023-07-18 NOTE — Telephone Encounter (Signed)
 Patient has request refill on medication Wellbutrin . Prescription is written for 1 tablet twice daily, but patient is taking medication 3 times daily. Prescription pend and sent to PCP Charlanne Fredia CROME, MD for approval.

## 2023-07-23 ENCOUNTER — Other Ambulatory Visit: Payer: Self-pay | Admitting: Adult Health

## 2023-07-23 DIAGNOSIS — N183 Hypertensive chronic kidney disease with stage 1 through stage 4 chronic kidney disease, or unspecified chronic kidney disease: Secondary | ICD-10-CM

## 2023-07-24 ENCOUNTER — Other Ambulatory Visit: Payer: Self-pay | Admitting: *Deleted

## 2023-07-24 MED ORDER — AMLODIPINE BESYLATE 5 MG PO TABS: 2.5000 mg | ORAL_TABLET | Freq: Two times a day (BID) | ORAL | 1 refills | Status: DC | PRN

## 2023-07-24 NOTE — Telephone Encounter (Signed)
Pharmacy Requested refill.  Pended Rx and sent to Dr. Gupta for approval due to HIGH ALERT Warning.  

## 2023-07-24 NOTE — Telephone Encounter (Signed)
 Patient had requested refill due to dosage change , medication pending sent to PCP

## 2023-07-26 ENCOUNTER — Telehealth: Payer: Self-pay | Admitting: Neurology

## 2023-07-26 NOTE — Telephone Encounter (Signed)
 Rosemarie Eather RAMAN, MD  Sonni Barse, Dedra, MD; Jerri Pfeiffer, MD  Dear Dr Charlanne,  I wanted to share this note from our Stroke specialist with you :    Interesting findings which can certainly be seen in cerebral amyloid angiopathy given the patient's age of 74 however they are quite asymmetric.  Given prior history of major sports injury could also represent remote hemorrhagic contusion in that area.  If patient has cognitive impairment and dementia and is likely amyloid angiopathy.  This patient is certainly not a candidate for dual antiplatelet therapy, thrombolysis or new antiamyloid therapies due to high risk for intracerebral hemorrhage.  Dedra Gores, MD

## 2023-08-23 ENCOUNTER — Other Ambulatory Visit (HOSPITAL_COMMUNITY): Payer: Self-pay

## 2023-09-04 ENCOUNTER — Other Ambulatory Visit: Payer: Self-pay | Admitting: Internal Medicine

## 2023-09-04 DIAGNOSIS — E538 Deficiency of other specified B group vitamins: Secondary | ICD-10-CM

## 2023-09-04 NOTE — Progress Notes (Deleted)
 Cardiology Office Note    Patient Name: Philip [name] Date of Encounter: 09/04/2023  Primary Care Provider:  Charlanne Fredia CROME, MD Primary Cardiologist:  Philip MARLA Red, MD Primary Electrophysiologist: None   Past Medical History    Past Medical History:  Diagnosis Date   Age-related macular degeneration, dry, both eyes    Aortitis syndrome (HCC) rheumotologist-  Philip [name]   IgG4 syndrome--  (effects abdomine) ,  previously long term use prednisone  last taken 07/ 2021   Basal cell carcinoma (BCC) of eye    CKD (chronic kidney disease), stage III [facility])    nephrologist-- Philip [name]   Complication of anesthesia    post op acute urinary retention   DDD (degenerative disc disease)    Fatigue    pt evalulation by cardiology--- Philip [name], note 05-21-2019 in epic,(non cardiac)  normal echo and event monitor showed extra beats of atrial tachycardia , no atrial fib, and rare ecotpy   Full dentures    GERD (gastroesophageal reflux disease)    History of basal cell carcinoma (BCC) excision    12-25-2018 MOH's reconstruction left lower eyelid   History of external beam radiation therapy 2002   prostate cancer  and boost with radioative prostate seed implants   History of prostate cancer DX  2002   S/P EXTERNAL RADIATION/ RADIOACTIVE SEED IMPLANTS  2003   History of squamous cell carcinoma excision    2012--  RIGHT LOWER EXTREM;  2019 LEFT LOWER EXTREMITIY   HTN (hypertension)    followed by pcp   Hyperlipidemia    Hypothyroidism    followed by pcp--- acquired atrophy due to radiation;  previously seen by endocrinologist-  Philip [name]--- per pt takes his own thyroid  med. called NP Thyroid  90 mg daily, does not take synthroid    Large vessel vasculitis (HCC)    rheumotologist-  Philip [name] --- IgG4 related aoritis involving renal arteries ,  pt treated with predisone.  (09-06-2019  per pt no longer prednisone  last taken one month ago 07/ 2021, weaned off by pcp per pt request)   Left renal  artery stenosis [facility])    nephrologist--- Philip [name]--- with aneurysm proximal    Lymphedema of left leg    followed by [facility]---- pt stated uses compression hose   OSA (obstructive sleep apnea) 09-06-2019  per pt lasted used cpap approx. 2019, stated felt he no longer needed    pt retested 11-13-2016 at [facility] w/ Philip [name]-- moderate to severe OSA , AHI 16.4/h/  03-24-2017    Pulmonary nodule followed by [facility]   per CT 06-11-2019  in Care Everywhere in epic done at [facility],  LUL nodule , not mets   Renal cyst, right    Restless legs syndrome (RLS)    S/P radiation therapy     for tonsillar cancer (head and neck) completed 01-26-2012 at [facility]);   and XRT for right lower leg sarcoma --- completed 06-06-2017   Saliva decreased    Sarcoma of lower extremity, right (HCC) oncolgoist-  Philip [name] ([facility]) & Philip [name] ([facility])   dx 02-14-2017 w/ needle core bx;  high grade pleomorphic spindle cell sarcoma , grade 2 (cT2N0M0); 03-15-2017  radical resection sarcoma tumor right lower leg  and completed radiation 06-06-2017   Self-catheterizes urinary bladder    QID   AND   PRN   Tonsillar cancer (HCC) 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 (cT2  N2b) (p16+) (Stage IVA)---- dx oct 2013  ----s/p left tonsillectomy and concurent chemo and radiation/  ended 01-26-2012-- no surgical intervention---  residuals ( dry mouth, decreased saliva)   oncologist at [facility]--  Philip [name]--  HX OF -- NO RECURRENCE   Urethral false passage 07/2019   Urethral stricture urologist--- Philip [name]   chronic---  post urethral dilation's   Urinary retention with incomplete bladder emptying    Vitamin B 12 deficiency 01/28/2011   Vitamin D  deficiency     History of Present Illness  Philip [name] is a 89 y.o. male with a PMH of HTN, CKD stage IIIb, PTSD, OSA, aortitis IgG4 syndrome, aortic atherosclerosis, ascending aortic aneurysm, prostate CA sarcoma of right lower  extremity s/p surgery and radiation who presents today for 34-month follow-up.  Philip [name] was last seen on 05/08/2023 with his daughter for 47-month follow-up.  He had previously been started on amlodipine  for hypertension with significant improvement.  He denied any chest pain or anginal complaint during visit.  He was seen in the ED on 06/01/2023 with complaint of dizziness and confusion.  He also noted headache and underwent an MRI that showed new superficial siderosis with chronic micro hemorrhages and evidence of cerebral amyloid angiopathy.  He was also treated for urinary tract infection with normal EKG and mildly elevated troponin.  He was seen in follow-up by neurology on 06/26/2023 and patient was advised to not use aspirin or Plavix due to amyloid angiopathy.  He was referred to audiology for assistance with hearing loss.  Pleated an Alzheimer's panel that was negative.   Patient denies chest pain, palpitations, dyspnea, PND, orthopnea, nausea, vomiting, dizziness, syncope, edema, weight gain, or early satiety.   Discussed the use of AI scribe software for clinical note transcription with the patient, who gave verbal consent to proceed.  History of Present Illness    ***Notes:   Review of Systems  Please see the history of present illness.    All other systems reviewed and are otherwise negative except as noted above.  Physical Exam    Wt Readings from Last 3 Encounters:  07/04/23 155 lb (70.3 kg)  06/26/23 156 lb 12.8 oz (71.1 kg)  06/13/23 161 lb 9.6 oz (73.3 kg)   CD:Uyzmz were no vitals filed for this visit.,There is no height or weight on file to calculate BMI. GEN: Well nourished, well developed in no acute distress Neck: No JVD; No carotid bruits Pulmonary: Clear to auscultation without rales, wheezing or rhonchi  Cardiovascular: Normal rate. Regular rhythm. Normal S1. Normal S2.   Murmurs: There is no murmur.  ABDOMEN: Soft, non-tender, non-distended EXTREMITIES:  No  edema; No deformity   EKG/LABS/ Recent Cardiac Studies   ECG personally reviewed by me today - ***  Risk Assessment/Calculations:   {Does this patient have ATRIAL FIBRILLATION?:[phone]}      Lab Results  Component Value Date   WBC 5.4 06/27/2023   HGB 13.3 (A) 06/27/2023   HCT 40 (A) 06/27/2023   MCV 92.1 06/01/2023   PLT 178 06/27/2023   Lab Results  Component Value Date   CREATININE 1.4 (A) 06/27/2023   BUN 26 (A) 06/27/2023   NA 140 06/27/2023   K 4.5 06/27/2023   CL 103 06/27/2023   CO2 23 (A) 06/27/2023   Lab Results  Component Value Date   CHOL 201 (A) 03/21/2023   HDL 72 (A) 03/21/2023   LDLCALC 113 03/21/2023   TRIG 78 03/21/2023    No results  found for: HGBA1C Assessment & Plan    Assessment and Plan Assessment & Plan     1. Essential HTN: - Patient's blood pressure today was   2.  Coronary calcifications: - Previous PET stress test completed microvascular dysfunction - Patient reports no chest pain or angina since previous visit.   3.  Ascending aortic aneurysm: - Surveillance MRA of the chest completed 06/2022 showing 4.5 cm dilation recommendation to repeat in 1 year - Continue good BP control and patient advised to abstain from heavy lifting.   4.  Chronic fatigue: - Ischemic workup and 2D echo complete with no complaints currently and improvement to symptoms.  5. Cerebral amyloid angiopathy (HCC) Per Neurology ATN profile negative for AD  Disposition: Follow-up with Arun K Thukkani, MD or APP in *** months {Are you ordering a CV Procedure (e.g. stress test, cath, DCCV, TEE, etc)?   Press F2        :[id number]}   Signed, Wyn Raddle, Jackee Shove, NP 09/04/2023, 1:21 PM [facility]

## 2023-09-05 ENCOUNTER — Ambulatory Visit: Payer: PRIVATE HEALTH INSURANCE | Attending: Nurse Practitioner | Admitting: Nurse Practitioner

## 2023-09-05 DIAGNOSIS — I2510 Atherosclerotic heart disease of native coronary artery without angina pectoris: Secondary | ICD-10-CM

## 2023-09-05 DIAGNOSIS — I7121 Aneurysm of the ascending aorta, without rupture: Secondary | ICD-10-CM

## 2023-09-05 DIAGNOSIS — I10 Essential (primary) hypertension: Secondary | ICD-10-CM

## 2023-09-05 DIAGNOSIS — I680 Cerebral amyloid angiopathy: Secondary | ICD-10-CM

## 2023-09-29 ENCOUNTER — Ambulatory Visit: Payer: Self-pay

## 2023-09-29 NOTE — Telephone Encounter (Signed)
 Recent blood pressured has improved to 140/80 per [place] nurse at [facility]. Patient is going to take blood pressures this weekend and take reading to Monday appointment with [facility].

## 2023-09-29 NOTE — Telephone Encounter (Signed)
 FYI Only or Action Required?: FYI only for provider.  Patient was last seen in primary care on 07/04/2023 by Charlanne Fredia CROME, MD.  Called Nurse Triage reporting Hypertension.  Symptoms began today.  Interventions attempted: Prescription medications: PRN Norvasc .  Symptoms are: Staff will call back with update after talking B/P medication.  Triage Disposition: See Physician Within 24 Hours  Patient/caregiver understands and will follow disposition?: Yes  **Appt. Scheduled for 9/15**          Copied from CRM #[id number]. Topic: Clinical - Red Word Triage >> Sep 29, 2023 11:21 AM Carrielelia G wrote: [facility] that prompted transfer to Nurse Triage: 198/100 BP Reason for Disposition  Systolic BP >= 180 OR Diastolic >= 110  Answer Assessment - Initial Assessment Questions 1. BLOOD PRESSURE: What is your blood pressure? Did you take at least two measurements 5 minutes apart?      Staff from assisted facility called stating B/P is 198/100. Norvac PRN was taken   2. ONSET: When did you take your blood pressure?     Today; a few minutes ago before call to triage.   3. HOW: How did you take your blood pressure? (e.g., automatic home BP monitor, visiting nurse)      BP manual cuff from facility.   4. HISTORY: Do you have a history of high blood pressure?     HTN  5. MEDICINES: Are you taking any medicines for blood pressure? Have you missed any doses recently?     Norvasc  PRN was taken a few minutes ago.   6. OTHER SYMPTOMS: Do you have any symptoms? (e.g., blurred vision, chest pain, difficulty breathing, headache, weakness)  Asymptomatic   Assisted living facility staff will recheck B/P in a hour after taking PRN Norvasc . Will call back with an update. Pt. Is asymptomatic. Appt. Scheduled for 9/15 to follow-up.  Protocols used: Blood Pressure - High-A-AH

## 2023-09-29 NOTE — Telephone Encounter (Signed)
 Scheduled to see Philip [name] on Monday

## 2023-09-29 NOTE — Telephone Encounter (Addendum)
 Danielle with [facility] called back ---patient took Norvasc  2.5mg  as per Edsel ---patient took another Norvasc  2.5mg  about an hour and a half ago  180/90 is the blood pressure just before speaking with triage Edsel states that patient has no complaints She is not currently with the patient She states that he was asymptomatic earlier and was asymptomatic when she saw him for this recent blood pressure check  This RN advised Edsel that I would call the patient to inquire about him symptoms This RN called both numbers for the patient---No answer on one number and left a voicemail on the other number  This RN called the CAL to advise them of patient's current blood pressure and being unable to speak with him to assess if he is having any symptoms or if anything has changed.

## 2023-10-02 ENCOUNTER — Encounter: Payer: Self-pay | Admitting: Adult Health

## 2023-10-02 ENCOUNTER — Ambulatory Visit: Admitting: Adult Health

## 2023-10-02 ENCOUNTER — Ambulatory Visit: Admitting: Nurse Practitioner

## 2023-10-02 VITALS — BP 138/74 | HR 76 | Temp 95.0°F | Ht 71.0 in | Wt 156.4 lb

## 2023-10-02 DIAGNOSIS — R413 Other amnesia: Secondary | ICD-10-CM | POA: Diagnosis not present

## 2023-10-02 DIAGNOSIS — I10 Essential (primary) hypertension: Secondary | ICD-10-CM | POA: Diagnosis not present

## 2023-10-02 NOTE — Patient Instructions (Signed)
 Fastin blood working Thursday at 8am in the clinic  We will have a speech therapist contact you for a cognitive assessment  Please bring your bp log next week and write down when you have taken your amlodipine .

## 2023-10-02 NOTE — Progress Notes (Signed)
 Location:  [facility]  POS: Clinic  Provider: Tawni America, ANP  Goals of Care:     06/01/2023   10:09 AM  Advanced Directives  Does Patient Have a Medical Advance Directive? Yes     Chief Complaint  Patient presents with   Hypertension    HPI: 89 y.o male here for follow up regarding HTN  BP log  105/64 198/100 128/90 135/83 170/104  Occasional dizziness when standing.  Takes norvasc  prn due being very sensitive with meds with hx of labile bp Also has CKD BUN 26 Cr 1.4 06/27/23  Wt Readings from Last 3 Encounters:  10/02/23 156 lb 6.4 oz (70.9 kg)  07/04/23 155 lb (70.3 kg)  06/26/23 156 lb 12.8 oz (71.1 kg)    Staff reports some concern about his memory and ability to manage his home and meds He lives with his wife and they alternate apartments at [facility] He is no longer driving MRI may of [id number] showed amyloid angiopathy  Past Medical History:  Diagnosis Date   Age-related macular degeneration, dry, both eyes    Aortitis syndrome (HCC) rheumotologist-  dr syed   IgG4 syndrome--  (effects abdomine) ,  previously long term use prednisone  last taken 07/ 2021   Basal cell carcinoma (BCC) of eye    CKD (chronic kidney disease), stage III [facility])    nephrologist-- dr forbes. gearline   Complication of anesthesia    post op acute urinary retention   DDD (degenerative disc disease)    Fatigue    pt evalulation by cardiology--- dr debby decent, note 05-21-2019 in epic,(non cardiac)  normal echo and event monitor showed extra beats of atrial tachycardia , no atrial fib, and rare ecotpy   Full dentures    GERD (gastroesophageal reflux disease)    History of basal cell carcinoma (BCC) excision    12-25-2018 MOH's reconstruction left lower eyelid   History of external beam radiation therapy 2002   prostate cancer  and boost with radioative prostate seed implants   History of prostate cancer DX  2002   S/P EXTERNAL RADIATION/ RADIOACTIVE SEED IMPLANTS  2003   History of  squamous cell carcinoma excision    2012--  RIGHT LOWER EXTREM;  2019 LEFT LOWER EXTREMITIY   HTN (hypertension)    followed by pcp   Hyperlipidemia    Hypothyroidism    followed by pcp--- acquired atrophy due to radiation;  previously seen by endocrinologist-  dr tommas--- per pt takes his own thyroid  med. called NP Thyroid  90 mg daily, does not take synthroid    Large vessel vasculitis (HCC)    rheumotologist-  dr syed --- IgG4 related aoritis involving renal arteries ,  pt treated with predisone.  (09-06-2019  per pt no longer prednisone  last taken one month ago 07/ 2021, weaned off by pcp per pt request)   Left renal artery stenosis [facility])    nephrologist--- dr gearline--- with aneurysm proximal    Lymphedema of left leg    followed by [facility]---- pt stated uses compression hose   OSA (obstructive sleep apnea) 09-06-2019  per pt lasted used cpap approx. 2019, stated felt he no longer needed    pt retested 11-13-2016 at [facility] w/ dr ather-- moderate to severe OSA , AHI 16.4/h/  03-24-2017    Pulmonary nodule followed by [facility]   per CT 06-11-2019  in Care Everywhere in epic done at [facility],  LUL nodule , not mets   Renal cyst,  right    Restless legs syndrome (RLS)    S/P radiation therapy     for tonsillar cancer (head and neck) completed 01-26-2012 at 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+) (Stage IVA)---- dx oct 2013  ----s/p left tonsillectomy and  concurent chemo and radiation/  ended 01-26-2012-- no surgical intervention---  residuals ( dry mouth, decreased saliva)   oncologist at [facility]--  dr brizel--  HX OF -- NO RECURRENCE   Urethral false passage 07/2019   Urethral stricture urologist--- dr watt   chronic---  post urethral dilation's   Urinary retention with incomplete bladder emptying    Vitamin B 12 deficiency 01/28/2011   Vitamin D  deficiency     Past Surgical History:  Procedure Laterality Date   BALLOON DILATION N/A 11/10/2014   Procedure: CYSTO BALLOON DILATION AND RETROGRADE URETHROGRAM ;  Surgeon: Glendia Elizabeth, MD;  Location: [facility];  Service: Urology;  Laterality: N/A;   CATARACT EXTRACTION W/ INTRAOCULAR LENS  IMPLANT, BILATERAL  2012  approx   CYSTO/ BALLOON DILATION OF  URETHRAL STRICTURE  12-25-2010   CYSTOSCOPY WITH RETROGRADE URETHROGRAM N/A 03/30/2016   Procedure: CYSTOSCOPY WITH RETROGRADE URETHROGRAM AND BALLOON DILATION with cystogram;  Surgeon: Glendia Elizabeth, MD;  Location: [facility];  Service: Urology;  Laterality: N/A;   CYSTOSCOPY WITH URETHRAL DILATATION  05/31/2011   Procedure: CYSTOSCOPY WITH URETHRAL DILATATION;  Surgeon: Glendia DELENA Elizabeth, MD;  Location: [facility];  Service: Urology;  Laterality: N/A;  BALLOON DILATION   CYSTOSCOPY WITH URETHRAL DILATATION N/A 09/13/2016   Procedure: CYSTOSCOPY WITH URETHRAL BALLOON DILATATION;  Surgeon: Elizabeth Glendia, MD;  Location: [facility];  Service: Urology;  Laterality: N/A;   CYSTOSCOPY WITH URETHRAL DILATATION N/A 03/30/2017   Procedure: CYSTOSCOPY WITH BALLOON URETHRAL DILATATION;  Surgeon: Elizabeth Glendia, MD;  Location: [facility];  Service: Urology;  Laterality: N/A;   CYSTOSCOPY WITH URETHRAL DILATATION N/A 09/12/2019   Procedure: CYSTOSCOPY WITH URETHRAL DILATATION;  Surgeon: watt Rush, MD;  Location: [facility];  Service: Urology;  Laterality:  N/A;   CYSTOSCOPY/RETROGRADE/URETEROSCOPY N/A 05/22/2012   Procedure: CYSTOSCOPY BALLOON DILATION RETROGRADE URETEROGRAM ;  Surgeon: Glendia DELENA Elizabeth, MD;  Location: [facility];  Service: Urology;  Laterality: N/A;   CYTSO/  DILATATION URETHRAL STRICTURE/  BX PROSTATIC URETHRA/  REMOVAL FOREIGN BODIES  06-25-2010   [facility]   ECTROPION REPAIR Left 06/18/2019   lower eyelid   INGUINAL HERNIA REPAIR Right 2000   MOHS SURGERY  01/ 2019    [facility]   left lower leg for [facility]   MOHS SURGERY  12/25/2018   for [facility] of left lower eyelid;  the second stage Hughs flap release 01-09-2019   RADICAL RESECTION OF SARCOMA TUMOR   03-15-2017   [facility]   RIGHT LOWER LEG , CALF AREA   RADIOACTIVE SEED IMPLANTS, PROSTATE  JAN  2003   SKIN LESION EXCISION  07/2012   MOST  right shoulder   TONSILLECTOMY Left 11-10-2011  @[facility]    tonsillar cancer   TRANSURETHRAL INCISION OF BLADDER NECK N/A 09/12/2019   Procedure: TRANSURETHRAL INCISION OF BLADDER NECK;  Surgeon: Watt Rush, MD;  Location: [facility];  Service: Urology;  Laterality: N/A;    Allergies  Allergen Reactions   Cefixime Rash   Contrast Media [Iodinated Contrast Media] Other (See Comments)    Avoid due to renal disease   Lisinopril  Swelling    Swollen lower lip   Macrobid [Nitrofurantoin Macrocrystal] Swelling   Nsaids Other (See Comments)    Has been told no NSAIDs, Ibuprofen Renal insufficiency hx.    Sulfa Antibiotics    Cephalosporins Rash    Outpatient Encounter Medications as of 10/02/2023  Medication Sig   amLODipine  (NORVASC ) 5 MG tablet Take 0.5 tablets (2.5 mg total) by mouth 2 (two) times daily as needed (HTN). Give if bp >140   buPROPion  (WELLBUTRIN ) 100 MG tablet Take 1 tablet (100 mg total) by mouth 3 (three) times daily.   Cholecalciferol  (VITAMIN D ) 50 MCG (2000 UT) CAPS Take by mouth every other day.   cyanocobalamin  (VITAMIN B12) 1000 MCG/ML injection INJECT 1ML INTRAMUSCULARLY EVERY 14 DAYS   NP  THYROID  120 MG tablet Take 90 mg by mouth daily.   SYRINGE-NEEDLE, DISP, 3 ML (B-D 3CC LUER-LOK SYR 25GX5/8) 25G X 5/8 3 ML MISC USE FOR INJECTION EVERY 14 DAYS   temazepam  (RESTORIL ) 7.5 MG capsule Take 1 capsule (7.5 mg total) by mouth at bedtime as needed for sleep.   ketoconazole (NIZORAL) 2 % cream Apply 1 Application topically daily. (Patient not taking: Reported on 10/02/2023)   Multiple Vitamins-Minerals (PRESERVISION AREDS 2) CAPS Take 1 capsule by mouth 2 (two) times daily. daily (Patient not taking: Reported on 10/02/2023)   No facility-administered encounter medications on file as of 10/02/2023.    Review of Systems:  Review of Systems  Constitutional:  Negative for activity change, appetite change, chills, diaphoresis, fatigue, fever and unexpected weight change.  HENT:  Negative for congestion.   Respiratory:  Negative for cough and shortness of breath.   Cardiovascular:  Positive for leg swelling. Negative for chest pain and palpitations.  Gastrointestinal:  Negative for abdominal distention, constipation and diarrhea.  Musculoskeletal:  Negative for arthralgias, back pain and gait problem.  Psychiatric/Behavioral:  Negative for agitation, behavioral problems and confusion.        Memory loss    Health Maintenance  Topic Date Due   Medicare Annual Wellness (AWV)  11/13/2020   COVID-19 Vaccine (8 - Moderna risk 2024-25 season) 09/18/2023   Pneumococcal Vaccine: 50+ Years  Completed   HPV VACCINES  Aged Out   Meningococcal B Vaccine  Aged Out   DTaP/Tdap/Td  Discontinued   Influenza Vaccine  Discontinued   Zoster Vaccines- Shingrix  Discontinued    Physical Exam: Vitals:   10/02/23 1407  BP: 138/74  Pulse: 76  Temp: (!) 95 F (35 C)  SpO2: 95%  Weight: 156 lb 6.4 oz (70.9 kg)  Height: 5' 11 (1.803 m)   Body mass index is 21.81 kg/m. Physical Exam Vitals and nursing note reviewed.  Constitutional:      Appearance: Normal appearance.  HENT:     Head:  Normocephalic and atraumatic.  Cardiovascular:     Rate and Rhythm: Normal rate and regular rhythm.     Heart sounds: No murmur heard. Pulmonary:     Effort: Pulmonary effort is normal. No  respiratory distress.     Breath sounds: Normal breath sounds. No wheezing.  Abdominal:     General: Bowel sounds are normal. There is no distension.     Palpations: Abdomen is soft.     Tenderness: There is no abdominal tenderness.  Musculoskeletal:     Cervical back: Normal range of motion. No rigidity.     Right lower leg: No edema.     Left lower leg: Edema (chronic, with mild erythema) present.  Lymphadenopathy:     Cervical: No cervical adenopathy.  Skin:    General: Skin is warm and dry.  Neurological:     General: No focal deficit present.     Mental Status: He is alert and oriented to person, place, and time. Mental status is at baseline.  Psychiatric:        Mood and Affect: Mood normal.     Labs reviewed: Basic Metabolic Panel: Recent Labs    02/04/23 2105 02/04/23 2128 03/21/23 0000 06/01/23 1019 06/05/23 0000 06/05/23 1555 06/27/23 0000 06/27/23 1000  NA 138 138   < > 137  --  140  --  140  K 4.3 4.4   < > 4.3  --  4.5  --  4.5  CL 103 105   < > 99  --  104  --  103  CO2 24  --    < > 27  --  25*  --  23*  GLUCOSE 102* 99  --  102*  --   --   --   --   BUN 35* 34*   < > 30*  --  28*  --  26*  CREATININE 1.86* 2.00*   < > 1.51*  --  1.5*  --  1.4*  CALCIUM 8.9  --    < > 8.8* 8.2*  --  8.9  --   MG 2.2  --   --   --   --   --   --   --    < > = values in this interval not displayed.   Liver Function Tests: Recent Labs    02/04/23 2105 03/21/23 0000 06/01/23 1019  AST 15 19 23   ALT 15 20 17   ALKPHOS 54 76 80  BILITOT 0.9  --  0.3  PROT 6.3*  --  6.7  ALBUMIN 3.5 3.9 3.8   No results for input(s): LIPASE, AMYLASE in the last 8760 hours. No results for input(s): AMMONIA in the last 8760 hours. CBC: Recent Labs    02/04/23 2105 02/04/23 2128  03/21/23 0000 06/01/23 1019 06/27/23 0000  WBC 6.0  --  4.8 3.9* 5.4  NEUTROABS  --   --   --  2.6  --   HGB 12.7*   < > 12.3* 13.0 13.3*  HCT 37.4*   < > 37* 37.3* 40*  MCV 94.9  --   --  92.1  --   PLT 158  --  209 121* 178   < > = values in this interval not displayed.   Lipid Panel: Recent Labs    03/21/23 0000  CHOL 201*  HDL 72*  LDLCALC 113  TRIG 78   No results found for: HGBA1C  Procedures since last visit: No results found.  Assessment/Plan  1. Essential hypertension (Primary) He is hesitant to make changes to his meds.  It is difficult with his memory loss to manage prn meds Would like to try low dose each day or every  other day Also I need more information in terms of bp log and response to med He will come back next week with another log and for his routine follow up  2. Memory loss Concern for progressive memory loss, ability to manage home and medications MRI 06/11/23 with pronounced superficial siderosis in the left hemisphere with associate chronic micro hemorrhages in the brain likely amyloid angiopathy  Has seen neurology Biomarkers are negative.  Neurology says no asa or plavix.   Will order ST for cognition to assess memory, ability to manage home and medications.   Labs/tests ordered:  * No order type specified * BMP Next appt:  10/09/2023   Total time :  time greater than 50% of total time spent doing pt counseling and coordination of care

## 2023-10-05 LAB — BASIC METABOLIC PANEL WITH GFR
BUN: 34 — AB (ref 4–21)
CO2: 25 — AB (ref 13–22)
Chloride: 100 (ref 99–108)
Creatinine: 1.5 — AB (ref 0.6–1.3)
Glucose: 84
Potassium: 4.2 meq/L (ref 3.5–5.1)
Sodium: 140 (ref 137–147)

## 2023-10-05 LAB — COMPREHENSIVE METABOLIC PANEL WITH GFR
Albumin: 3.9 (ref 3.5–5.0)
Calcium: 9.4 (ref 8.7–10.7)
eGFR: 34

## 2023-10-05 LAB — HEPATIC FUNCTION PANEL
ALT: 16 U/L (ref 10–40)
AST: 20 (ref 14–40)
Alkaline Phosphatase: 92 (ref 25–125)
Bilirubin, Total: 0.3

## 2023-10-09 ENCOUNTER — Non-Acute Institutional Stay: Admitting: Adult Health

## 2023-10-10 NOTE — Progress Notes (Signed)
 This encounter was created in error - please disregard.

## 2023-12-12 ENCOUNTER — Encounter: Payer: Self-pay | Admitting: Internal Medicine

## 2023-12-12 ENCOUNTER — Telehealth: Payer: Self-pay | Admitting: Neurology

## 2023-12-12 ENCOUNTER — Non-Acute Institutional Stay: Admitting: Internal Medicine

## 2023-12-12 VITALS — BP 136/80 | HR 70 | Temp 97.8°F | Ht 71.0 in | Wt 156.4 lb

## 2023-12-12 DIAGNOSIS — E854 Organ-limited amyloidosis: Secondary | ICD-10-CM | POA: Diagnosis not present

## 2023-12-12 DIAGNOSIS — G9689 Other specified disorders of central nervous system: Secondary | ICD-10-CM

## 2023-12-12 DIAGNOSIS — G459 Transient cerebral ischemic attack, unspecified: Secondary | ICD-10-CM

## 2023-12-12 DIAGNOSIS — I129 Hypertensive chronic kidney disease with stage 1 through stage 4 chronic kidney disease, or unspecified chronic kidney disease: Secondary | ICD-10-CM | POA: Diagnosis not present

## 2023-12-12 DIAGNOSIS — I680 Cerebral amyloid angiopathy: Secondary | ICD-10-CM

## 2023-12-12 DIAGNOSIS — R4189 Other symptoms and signs involving cognitive functions and awareness: Secondary | ICD-10-CM | POA: Diagnosis not present

## 2023-12-12 DIAGNOSIS — R4789 Other speech disturbances: Secondary | ICD-10-CM

## 2023-12-12 DIAGNOSIS — N183 Chronic kidney disease, stage 3 unspecified: Secondary | ICD-10-CM

## 2023-12-12 NOTE — Patient Instructions (Addendum)
 Your Exam today was Normal If you have symptoms of TIA again please go to ED Also Call Dr Cleatrice office to see her for these Episodes You cannot Drive

## 2023-12-12 NOTE — Telephone Encounter (Signed)
 Pt called stating that  Dr. Charlanne called to request  for Pt to be seen asap.Pt stated that  Dr. Charlanne is stated that Pt has been having  TIA Problems

## 2023-12-12 NOTE — Progress Notes (Addendum)
 "  Location: [facility] of Service:  Clinic (12)  Provider:   Code Status:  Goals of Care:     06/01/2023   10:09 AM  Advanced Directives  Does Patient Have a Medical Advance Directive? Yes     Chief Complaint  Patient presents with   Acute Visit    Patient states he thinks he is having TIA (transient ischemic attack) issues     HPI: Patient is a 89 y.o. male seen today for an acute visit for Possible TIA  Discussed the use of AI scribe software for clinical note transcription with the patient, who gave verbal consent to proceed.  History of Present Illness   Patient has experienced brief episodes of unclear speech with incomplete eye closure and numbness or tingling in his fingers. The first occurred a few days ago, resolved within minutes, and was evaluated by a [facility] nurse and was found to have no Focal Symptoms  A similar brief episode occurred yesterday while he was a passenger in a car, with milder speech impairment, and Could not Close his Eyes, and more prolonged finger numbness. This also resolved within a few minutes.  He reports stable blood pressures at home. He takes amlodipine  2.5mg  twice daily but missed both doses yesterday and did not take it this morning.  He has dizziness related to past blood pressure variability and has not had recent low blood pressure. He stopped driving a couple of years ago because of these issues and depends on [employer] for transportation.  He also Continues to have Cognitive issues  MRI brain has shown CAA Seen Neurology Alzheimer panel negative  He has macular degeneration and follows with Dr. Elner.  He prepares his own meals, maintains his diet, and is independent in daily activities.      Past Medical History:  Diagnosis Date   Age-related macular degeneration, dry, both eyes    Aortitis syndrome (HCC) rheumotologist-  dr syed   IgG4 syndrome--  (effects abdomine) ,  previously long  term use prednisone  last taken 07/ 2021   Basal cell carcinoma (BCC) of eye    CKD (chronic kidney disease), stage III [facility])    nephrologist-- dr forbes. gearline   Complication of anesthesia    post op acute urinary retention   DDD (degenerative disc disease)    Fatigue    pt evalulation by cardiology--- dr debby decent, note 05-21-2019 in epic,(non cardiac)  normal echo and event monitor showed extra beats of atrial tachycardia , no atrial fib, and rare ecotpy   Full dentures    GERD (gastroesophageal reflux disease)    History of basal cell carcinoma (BCC) excision    12-25-2018 MOH's reconstruction left lower eyelid   History of external beam radiation therapy 2002   prostate cancer  and boost with radioative prostate seed implants   History of prostate cancer DX  2002   S/P EXTERNAL RADIATION/ RADIOACTIVE SEED IMPLANTS  2003   History of squamous cell carcinoma excision    2012--  RIGHT LOWER EXTREM;  2019 LEFT LOWER EXTREMITIY   HTN (hypertension)    followed by pcp   Hyperlipidemia    Hypothyroidism    followed by pcp--- acquired atrophy due to radiation;  previously seen by endocrinologist-  dr tommas--- per pt takes his own thyroid  med. called NP Thyroid  90 mg daily, does not take synthroid    Large vessel vasculitis (HCC)    rheumotologist-  dr syed --- IgG4  related aoritis involving renal arteries ,  pt treated with predisone.  (09-06-2019  per pt no longer prednisone  last taken one month ago 07/ 2021, weaned off by pcp per pt request)   Left renal artery stenosis    nephrologist--- dr gearline--- with aneurysm proximal    Lymphedema of left leg    followed by [facility]---- pt stated uses compression hose   OSA (obstructive sleep apnea) 09-06-2019  per pt lasted used cpap approx. 2019, stated felt he no longer needed    pt retested 11-13-2016 at [facility] w/ dr ather-- moderate to severe OSA , AHI 16.4/h/  03-24-2017    Pulmonary nodule [facility] by [facility]    per CT 06-11-2019  in Care Everywhere in epic done at [facility],  LUL nodule , not mets   Renal cyst, right    Restless legs syndrome (RLS)    S/P radiation therapy     for tonsillar cancer (head and neck) completed 01-26-2012 at [facility]);   and XRT for right lower leg sarcoma --- completed 06-06-2017   Saliva decreased    Sarcoma of lower extremity, right (HCC) oncolgoist-  dr candiss ([facility]) & Dr MICAEL Ramp ([facility])   dx 02-14-2017 w/ needle core bx;  high grade pleomorphic spindle cell sarcoma , grade 2 (cT2N0M0); 03-15-2017  radical resection sarcoma tumor right lower leg  and completed radiation 06-06-2017   Self-catheterizes urinary bladder    QID   AND   PRN   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+) (Stage IVA)---- dx oct 2013  ----s/p left tonsillectomy and concurent chemo and radiation/  ended 01-26-2012-- no surgical intervention---  residuals ( dry mouth, decreased saliva)   oncologist at [facility]--  dr brizel--  HX OF -- NO RECURRENCE   Urethral false passage 07/2019   Urethral stricture urologist--- dr watt   chronic---  post urethral dilation's   Urinary retention with incomplete bladder emptying    Vitamin B 12 deficiency 01/28/2011   Vitamin D  deficiency     Past Surgical History:  Procedure Laterality Date   BALLOON DILATION N/A 11/10/2014   Procedure: CYSTO BALLOON DILATION AND RETROGRADE URETHROGRAM ;  Surgeon: Glendia Elizabeth, MD;  Location: [facility];  Service: Urology;  Laterality: N/A;   CATARACT EXTRACTION W/ INTRAOCULAR LENS  IMPLANT, BILATERAL  2012  approx   CYSTO/ BALLOON DILATION OF  URETHRAL STRICTURE  12-25-2010   CYSTOSCOPY WITH RETROGRADE URETHROGRAM N/A 03/30/2016   Procedure: CYSTOSCOPY WITH RETROGRADE URETHROGRAM AND BALLOON DILATION with cystogram;  Surgeon: Glendia Elizabeth, MD;  Location: [facility];  Service: Urology;  Laterality: N/A;   CYSTOSCOPY WITH  URETHRAL DILATATION  05/31/2011   Procedure: CYSTOSCOPY WITH URETHRAL DILATATION;  Surgeon: Glendia DELENA Elizabeth, MD;  Location: [facility] [place];  Service: Urology;  Laterality: N/A;  BALLOON DILATION   CYSTOSCOPY WITH URETHRAL DILATATION N/A 09/13/2016   Procedure: CYSTOSCOPY WITH URETHRAL BALLOON DILATATION;  Surgeon: Elizabeth Glendia, MD;  Location: [facility] [place];  Service: Urology;  Laterality: N/A;   CYSTOSCOPY WITH URETHRAL DILATATION N/A 03/30/2017   Procedure: CYSTOSCOPY WITH BALLOON URETHRAL DILATATION;  Surgeon: Elizabeth Glendia, MD;  Location: [facility] [place];  Service: Urology;  Laterality: N/A;   CYSTOSCOPY WITH URETHRAL DILATATION N/A 09/12/2019   Procedure: CYSTOSCOPY WITH URETHRAL DILATATION;  Surgeon: Watt Rush, MD;  Location: [facility];  Service: Urology;  Laterality: N/A;  CYSTOSCOPY/RETROGRADE/URETEROSCOPY N/A 05/22/2012   Procedure: CYSTOSCOPY BALLOON DILATION RETROGRADE URETEROGRAM ;  Surgeon: Glendia DELENA Elizabeth, MD;  Location: [facility];  Service: Urology;  Laterality: N/A;   CYTSO/  DILATATION URETHRAL STRICTURE/  BX PROSTATIC URETHRA/  REMOVAL FOREIGN BODIES  06-25-2010   [facility]   ECTROPION REPAIR Left 06/18/2019   lower eyelid   INGUINAL HERNIA REPAIR Right 2000   MOHS SURGERY  01/ 2019    [facility]   left lower leg for [facility]   MOHS SURGERY  12/25/2018   for [facility] of left lower eyelid;  the second stage Hughs flap release 01-09-2019   RADICAL RESECTION OF SARCOMA TUMOR   03-15-2017   [facility]   RIGHT LOWER LEG , CALF AREA   RADIOACTIVE SEED IMPLANTS, PROSTATE  JAN  2003   SKIN LESION EXCISION  07/2012   MOST  right shoulder   TONSILLECTOMY Left 11-10-2011  @[facility]    tonsillar cancer   TRANSURETHRAL INCISION OF BLADDER NECK N/A 09/12/2019   Procedure: TRANSURETHRAL INCISION OF BLADDER NECK;  Surgeon: Watt Rush, MD;  Location: [facility] [place];  Service: Urology;  Laterality: N/A;    Allergies  Allergen  Reactions   Cefixime Rash   Contrast Media [Iodinated Contrast Media] Other (See Comments)    Avoid due to renal disease   Lisinopril  Swelling    Swollen lower lip   Macrobid [Nitrofurantoin Macrocrystal] Swelling   Nsaids Other (See Comments)    Has been told no NSAIDs, Ibuprofen Renal insufficiency hx.    Sulfa Antibiotics    Cephalosporins Rash    Outpatient Encounter Medications as of 12/12/2023  Medication Sig   amLODipine  (NORVASC ) 5 MG tablet Take 0.5 tablets (2.5 mg total) by mouth 2 (two) times daily as needed (HTN). Give if bp >140   buPROPion  (WELLBUTRIN ) 100 MG tablet Take 1 tablet (100 mg total) by mouth 3 (three) times daily.   Cholecalciferol  (VITAMIN D ) 50 MCG (2000 UT) CAPS Take by mouth every other day.   cyanocobalamin  (VITAMIN B12) 1000 MCG/ML injection INJECT 1ML INTRAMUSCULARLY EVERY 14 DAYS   NP THYROID  120 MG tablet Take 90 mg by mouth daily.   SYRINGE-NEEDLE, DISP, 3 ML (B-D 3CC LUER-LOK SYR 25GX5/8) 25G X 5/8 3 ML MISC USE FOR INJECTION EVERY 14 DAYS   [DISCONTINUED] temazepam  (RESTORIL ) 7.5 MG capsule Take 1 capsule (7.5 mg total) by mouth at bedtime as needed for sleep. (Patient not taking: Reported on 12/12/2023)   No facility-administered encounter medications on file as of 12/12/2023.    Review of Systems:  Review of Systems  Constitutional:  Negative for activity change, appetite change and unexpected weight change.  HENT: Negative.    Respiratory:  Negative for cough and shortness of breath.   Cardiovascular:  Positive for leg swelling.  Gastrointestinal:  Negative for constipation.  Genitourinary:  Negative for frequency.  Musculoskeletal:  Negative for arthralgias, gait problem and myalgias.  Skin: Negative.  Negative for rash.  Neurological:  Positive for dizziness. Negative for weakness.  Psychiatric/Behavioral:  Positive for confusion. Negative for sleep disturbance.   All other systems reviewed and are negative.   Health Maintenance   Topic Date Due   Medicare Annual Wellness (AWV)  11/13/2020   COVID-19 Vaccine (8 - 2025-26 season) 09/18/2023   Pneumococcal Vaccine: 50+ Years  Completed   Meningococcal B Vaccine  Aged Out   DTaP/Tdap/Td  Discontinued   Influenza Vaccine  Discontinued   Zoster Vaccines- Shingrix  Discontinued    Physical Exam:  Vitals:   12/12/23 0919  BP: 136/80  Pulse: 70  Temp: 97.8 F (36.6 C)  SpO2: 95%  Weight: 156 lb 6.4 oz (70.9 kg)  Height: 5' 11 (1.803 m)   Body mass index is 21.81 kg/m. Physical Exam Vitals reviewed.  Constitutional:      Appearance: Normal appearance.  HENT:     Head: Normocephalic.     Nose: Nose normal.     Mouth/Throat:     Mouth: Mucous membranes are moist.     Pharynx: Oropharynx is clear.  Eyes:     Pupils: Pupils are equal, round, and reactive to light.  Cardiovascular:     Rate and Rhythm: Normal rate and regular rhythm.     Pulses: Normal pulses.     Heart sounds: No murmur heard. Pulmonary:     Effort: Pulmonary effort is normal. No respiratory distress.     Breath sounds: Normal breath sounds. No rales.  Abdominal:     General: Abdomen is flat. Bowel sounds are normal.     Palpations: Abdomen is soft.  Musculoskeletal:        General: Swelling present.     Cervical back: Neck supple.  Skin:    General: Skin is warm.  Neurological:     General: No focal deficit present.     Mental Status: He is alert and oriented to person, place, and time.     Comments: Full Neuro Exam was negative for any deficits  Psychiatric:        Mood and Affect: Mood normal.        Thought Content: Thought content normal.     Labs reviewed: Basic Metabolic Panel: Recent Labs    02/04/23 2105 02/04/23 2128 03/21/23 0000 06/01/23 1019 06/05/23 0000 06/05/23 1555 06/27/23 0000 06/27/23 1000 10/05/23 0000  NA 138 138   < > 137  --  140  --  140 140  K 4.3 4.4   < > 4.3  --  4.5  --  4.5 4.2  CL 103 105   < > 99  --  104  --  103 100  CO2 24  --     < > 27  --  25*  --  23* 25*  GLUCOSE 102* 99  --  102*  --   --   --   --   --   BUN 35* 34*   < > 30*  --  28*  --  26* 34*  CREATININE 1.86* 2.00*   < > 1.51*  --  1.5*  --  1.4* 1.5*  CALCIUM 8.9  --    < > 8.8* 8.2*  --  8.9  --  9.4  MG 2.2  --   --   --   --   --   --   --   --    < > = values in this interval not displayed.   Liver Function Tests: Recent Labs    02/04/23 2105 03/21/23 0000 06/01/23 1019 10/05/23 0000  AST 15 19 23 20   ALT 15 20 17 16   ALKPHOS 54 76 80 92  BILITOT 0.9  --  0.3  --   PROT 6.3*  --  6.7  --   ALBUMIN 3.5 3.9 3.8 3.9   No results for input(s): LIPASE, AMYLASE in the last 8760 hours. No results for input(s): AMMONIA in the last 8760 hours. CBC: Recent Labs    02/04/23 2105 02/04/23 2128 03/21/23 0000  06/01/23 1019 06/27/23 0000  WBC 6.0  --  4.8 3.9* 5.4  NEUTROABS  --   --   --  2.6  --   HGB 12.7*   < > 12.3* 13.0 13.3*  HCT 37.4*   < > 37* 37.3* 40*  MCV 94.9  --   --  92.1  --   PLT 158  --  209 121* 178   < > = values in this interval not displayed.   Lipid Panel: Recent Labs    03/21/23 0000  CHOL 201*  HDL 72*  LDLCALC 113  TRIG 78   No results found for: HGBA1C  Procedures since last visit: No results found.  Assessment/Plan 1. 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  Cannot do statin or Aspirin I told him to call Neurology office to see Dr Chalice Also go to ED when have another episode   Hypertension Not sure is he is taking his Amlodipine  or missing the doses - Continue amlodipine  5 mg twice daily. - Monitor blood pressure regularly, aiming for 120-140 mmHg.      Worsening Cognition Noticed today Cannot drive anymore Able to do his ADLS per him Not sure if he is taking his meds         Labs/tests ordered:  * No order type specified * Next appt:  Visit date not found  "

## 2023-12-13 NOTE — Addendum Note (Signed)
 Addended by: CHALICE SAUNAS on: 12/13/2023 02:03 PM   Modules accepted: Orders

## 2023-12-13 NOTE — Telephone Encounter (Addendum)
 He was to follow prn-   We can work him in for NP or MD revisit.    hemosiderosis of the brain had been known when we last met,  his work up revealed an amyloid negative status . [name]   MRI findings are known: MRI visualized the hemosiderin deposits as a characteristic T2 hypointense signal along the cortical and cerebellar surfaces.  Radiology felt this was more likely Amyloid angiopathy ( CAA/CAARI / ABRA ) .  Philip [name] condition is known, not new:   We worked him up for possible overlay with AD ( Alzheimer's Amyloidosis ) and that was negative.  Philip [name] tested negative for Alzheimer amyloid biomarkers. He was tested for Apolipo E4 genetic status: remarkably he has E3/E3 status .  Lower amyloidosis  risk.    This leaves the dx of Hemosiderosis of the brain without amyloid, more specifically called superficial siderosis (SS). It is the deposition of hemosiderin (an iron-containing byproduct of blood) on the surface of the brain and spinal cord from chronic, low-grade bleeding into the subarachnoid space.  This condition is distinct from amyloid-related conditions and can be caused by factors like previous trauma, spinal fluid leaks, or vascular malformations.   Common symptoms include hearing loss, cerebellar ataxia, and pyramidal signs, and early diagnosis through MRI is crucial for potential surgical intervention to stop the bleeding and prevent further damage.    Causes Chronic or intermittent low-grade bleeding into the subarachnoid space Brain or spinal cord trauma (e.g., surgical or accidental)  Vascular malformations or tumors in the brain.  Spinal fluid leaks -   -none of them present in Philip [name],  who has a history of vasculitis :    His Symptoms are fitting :  1) Progressive sensorineural hearing loss 2) gait instability , Cerebellar ataxia (coordination and balance problems) 3) Pyramidal signs (e.g., spasticity, hyperreflexia) 4) Headache 5) Confusion, speech  difficulties, incoordination.    Treatment of  superficial hemosiderosis:   Finding and stopping the source of bleeding is the main goal. Surgical intervention to repair the source is crucial for preventing further progression of the disease.  Treatment by Iron-chelating agents :  their efficacy  is still under investigation(NIH ).     Summary : In Philip [name]'s case, there is no evidence of any bleeding from AVM, nor aneurysm.   See CT January 2025, MRI  06-01-2023.  I can offer an additional MRA or CTA study to make sure no vascular leak was overlooked.    Neither Hemosiderin/ (brain siderosis) nor CAA is reversible : Recommended are  blood pressure management and injury prevention.   Nothing can at this point reverse the pathology, but we can provide help with managing symptoms. : balance impairment , hearing loss, cognitive impairment management can be offered      Dedra Gores, MD

## 2023-12-13 NOTE — Telephone Encounter (Signed)
 Attempted to contact pt for more info, No answer, VM unavailable.  Will send to RNs as well

## 2023-12-13 NOTE — Telephone Encounter (Signed)
 Pt saw Dr. Chalice 06/26/23.  Has no f/u scheduled.  Note from Dr. Charlanne 12/12/23:

## 2023-12-13 NOTE — Patient Instructions (Addendum)
 Amyloid hemosiderosis of the brain is a term that describes hemosiderin (iron-containing pigment from old blood) deposition in the brain caused by a condition called cerebral amyloid angiopathy (CAA). CAA involves the abnormal buildup of amyloid proteins in the walls of brain blood vessels, making them fragile and prone to bleeding. This can result in symptoms like cognitive decline, headaches, transient neurological symptoms, and, over time, the accumulation of hemosiderin, which is detectable on brain imaging.    What it is a Cerebral amyloid angiopathy (CAA):   A progressive condition where amyloid proteins build up in the walls of arteries in the brain. Hemosiderin deposition: As the amyloid-laden vessels leak, red blood cells break down, leaving behind hemosiderin deposits that accumulate in the brain, often in the superficial layers. This is known as superficial hemosiderosis.   Symptoms and effects Brain bleeds: The fragile vessels can lead to both large and micro-hemorrhages (bleeding) in the brain.   Cognitive impairment: It can contribute to dementia and cognitive decline, especially in older individuals. Transient neurological symptoms: Patients may experience temporary neurological issues like weakness, numbness, or dizziness.TIA.  Other symptoms: Other signs can include headaches, hearing loss, ataxia (problems with coordination), and spasticity.  Risk factors Age: CAA is relatively common in people over the age of 65.  It is frequently seen in individuals with Alzheimer's disease, which also involves amyloid buildup in the brain. ( Mr Tuohy tested negative for Alzheimer biomarkers).   Diagnosis and prognosis: Diagnosis: The diagnosis is typically made using a combination of clinical symptoms, radiographic findings from imaging like MRI, and sometimes pathological examination. Prognosis: The outlook depends on the severity of symptoms. Larger bleeds and older age are associated  with worse outcomes.   Currently, there are no disease-modifying treatments for CAA itself, but management focuses on treating the consequences, such as controlling blood pressure, and reducing  antiplatelet therapy, as well as managing symptoms like fall risk, cognitive impairment.

## 2023-12-13 NOTE — Telephone Encounter (Signed)
 I chose the MRA non contrast  over CTA due to a X ray contrast allergy.   Dr Charlanne, I ordered the MRA brain at [facility], in proximity to the patients residence.  I will see him ASAP.

## 2023-12-19 ENCOUNTER — Ambulatory Visit (HOSPITAL_COMMUNITY): Admission: RE | Admit: 2023-12-19 | Discharge: 2023-12-19 | Attending: Neurology | Admitting: Neurology

## 2023-12-19 DIAGNOSIS — E854 Organ-limited amyloidosis: Secondary | ICD-10-CM | POA: Diagnosis present

## 2023-12-19 DIAGNOSIS — G9689 Other specified disorders of central nervous system: Secondary | ICD-10-CM | POA: Insufficient documentation

## 2023-12-19 DIAGNOSIS — G459 Transient cerebral ischemic attack, unspecified: Secondary | ICD-10-CM | POA: Insufficient documentation

## 2023-12-19 DIAGNOSIS — I680 Cerebral amyloid angiopathy: Secondary | ICD-10-CM | POA: Diagnosis present

## 2023-12-19 DIAGNOSIS — R4789 Other speech disturbances: Secondary | ICD-10-CM | POA: Diagnosis present

## 2023-12-19 NOTE — Telephone Encounter (Signed)
 Pt called back stating that he wil take appt @11 :30 on 12/21/23 ,Can you please schedule Pt

## 2023-12-19 NOTE — Telephone Encounter (Signed)
 Called pt and he was having MRA done.  I offered appt 12-21-2023 at 1130 with Dr. Chalice (he had to hang up) so unclear if that would work for him.  Will try later.

## 2023-12-21 ENCOUNTER — Telehealth: Payer: Self-pay

## 2023-12-21 ENCOUNTER — Ambulatory Visit: Admitting: Neurology

## 2023-12-21 VITALS — BP 213/109 | HR 67 | Ht 71.0 in | Wt 157.0 lb

## 2023-12-21 DIAGNOSIS — G4734 Idiopathic sleep related nonobstructive alveolar hypoventilation: Secondary | ICD-10-CM | POA: Diagnosis not present

## 2023-12-21 DIAGNOSIS — R9089 Other abnormal findings on diagnostic imaging of central nervous system: Secondary | ICD-10-CM | POA: Insufficient documentation

## 2023-12-21 DIAGNOSIS — F324 Major depressive disorder, single episode, in partial remission: Secondary | ICD-10-CM

## 2023-12-21 DIAGNOSIS — I7121 Aneurysm of the ascending aorta, without rupture: Secondary | ICD-10-CM | POA: Diagnosis not present

## 2023-12-21 DIAGNOSIS — G4733 Obstructive sleep apnea (adult) (pediatric): Secondary | ICD-10-CM | POA: Diagnosis not present

## 2023-12-21 DIAGNOSIS — M316 Other giant cell arteritis: Secondary | ICD-10-CM

## 2023-12-21 NOTE — Progress Notes (Addendum)
 Provider:  Dedra Gores, MD  Primary Care Physician:  Philip Fredia CROME, MD [address] [place] [address]     Referring Provider: Charlanne Fredia CROME, Md [address] [place],  [place] [address]          Chief Complaint according to patient   Patient presents with:     Patient came in with first bp reading of 201/109. Second reading was 213/109. Patient states he has had dizzyness for a couple years now and it doesn't bother him because he has good balance. I am unsure if dizzyness was related to Bp being high. Last time BP medication was taken was last night. States he hasnt been compliant /consistent with taking his medication.            HISTORY OF PRESENT ILLNESS:  Philip [name] is a 89 y.o. male patient who is here for revisit 12/21/2023 for  discussion of brain MRI-   Chief concern according to patient :  I have dizziness, I don't feel the CPAP helps me much  and h is not voicing concerns over the MRI . He is tearful when he speaks about  moving to assisted living, felt  pressured to agree to it but shows insight .   Hemosiderin deposits , notable on the brain MRI  scan, and we were meeting to  discuss the risk factors for his condition include high blood pressure, this also contributes to dizziness, which is  a latent concern for years .  I have ordered an MRA study to make sure there are no leaking Aneuryms or AVMs.  This study was ordered at the [facility] location.   We reviewed the scan together, he has many posterior brain hemorrhages.   Mild cortical atrophy would be normal for age :  Fairly extensive superficial siderosis in the left cerebral hemisphere both the superior perirolandic area (series 12, image 49) and confluent along the lateral left temporal and occipital lobes. Additional scattered chronic microhemorrhages, also asymmetrically affecting the left hemisphere.   He is using CPAP , has been treated for OSA.  The residual AHI  was 9.3/h and his machine is over 53 years old and set at 13 cm water . He has no significant air leakage. I feel he has not enough pressure - and I will order an autotitration device for him to help cover that presumed deficit.       Last encounter :   Following ED visit in may 5788;  89 year old male with TIA, bad headache, confusion since last night. Unsteadiness.    Philip [name] is a  89 year old widowed [place] gentleman who resides at [facility] and returned to the sleep clinic after a HST 02-15-2021 resulted with an AHI of 27.6/h. The REM sleep AHI was 35.8/h and supine AHI was 33.8/h, 25 minutes of hypoxia, bradycardia were recorded as well.  Patient used his own CPAP without success, which had been set up in 2018 with a set 9 cm H20 pressure and 3 cm EPR. high air leaks were present, a mask fit will be needed as well. Residual AHI was 9.8/h. here to get re-titrated and re-fitted to a new interface (trying a non-FFM), and to be checked for any hypoxia remaining.    In lab sleep study showed a well controlled apnea index of 5.4 on CPAP The electrocardiogram documented arrythmia with variable R to R intervals and bradycardia.  The average heart rate during  sleep was 58 bpm.  The heart rate during sleep varied between a minimum of 50 and  a maximum of  77 bpm.  AUDIO and VIDEO: No abnormal complex movements, periodic or myoclonic movements, nor vocalization in sleep.   IMPRESSION: 1) Sleep disordered breathing was controlled and hypoxia was alleviated under 11 cm water  pressure CPAP. No snoring was recorded during this titration.     2) Periodic limb movements were very frequently noted, but they did not cause arousals. There was a radical shift from frequent PLMs to none after the 9 cm water  pressure mark was reached.  3) Total sleep time was within normal limits at 368.0 minutes.  Sleep efficiency was slightly decreased at 83.6% due to prolonged latency.  Sleep fragmentation was not  present.   RECOMMENDATIONS: I like for Philip [name] to be fitted for an interface he likes and finds comfortable. If the medium size SIMPLUS FFM is that interface, he is welcome to it.  I also want to set his new machine, which will be due in December 2023 to be an autotitration device by [employer] with a pressure window from 5 through 13 cm water , 2 cm EPR and heated humidification. RV after 30 days and before 90 day-mark on the new therapy with me or NP in our sleep clinic.     Review of Systems: Out of a complete 14 system review, the patient complains of only the following symptoms, and all other reviewed systems are negative.:    Patient came in with first bp reading of 201/109. Second reading was 213/109. Patient states he has had dizzyness for a couple years now and it doesn't bother him because he has good balance. I am unsure if dizzyness was related to Bp being high. Last time BP medication was taken was last night. States he hasnt been consistent with taking his medication.        Social History   Socioeconomic History   Marital status: Widowed    Spouse name: Not on file   Number of children: 4   Years of education: college   Highest education level: Not on file  Occupational History   Occupation: retired  Tobacco Use   Smoking status: Never   Smokeless tobacco: Never  Vaping Use   Vaping status: Never Used  Substance and Sexual Activity   Alcohol use: Yes    Alcohol/week: 7.0 standard drinks of alcohol    Types: 7 Glasses of wine per week    Comment: 8oz, glass or red wine for dinner   Drug use: No   Sexual activity: Not on file  Other Topics Concern   Not on file  Social History Narrative   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    Right handed   Social Drivers of Health   Financial Resource Strain: Low Risk  (08/08/2017)   Overall Financial Resource  Strain (CARDIA)    Difficulty of Paying Living Expenses: Not hard at all  Food Insecurity: No Food Insecurity (03/30/2022)   Hunger Vital Sign    Worried About Running Out of Food in the Last Year: Never true    Ran Out of Food in the Last Year: Never true  Transportation Needs: No Transportation Needs (03/30/2022)   PRAPARE - Administrator, Civil Service (Medical): No    Lack of Transportation (Non-Medical):  No  Physical Activity: Inactive (03/30/2022)   Exercise Vital Sign    Days of Exercise per Week: 0 days    Minutes of Exercise per Session: 0 min  Stress: No Stress Concern Present (08/08/2017)   [employer] of Occupational Health - Occupational Stress Questionnaire    Feeling of Stress : Only a little  Social Connections: Somewhat Isolated (08/08/2017)   Social Connection and Isolation Panel    Frequency of Communication with Friends and Family: More than three times a week    Frequency of Social Gatherings with Friends and Family: More than three times a week    Attends Religious Services: Never    Database Administrator or Organizations: No    Attends Engineer, Structural: Never    Marital Status: Married    Family History  Problem Relation Age of Onset   Stroke Father     Past Medical History:  Diagnosis Date   Age-related macular degeneration, dry, both eyes    Aortitis syndrome (HCC) rheumotologist-  dr syed   IgG4 syndrome--  (effects abdomine) ,  previously long term use prednisone  last taken 07/ 2021   Basal cell carcinoma (BCC) of eye    CKD (chronic kidney disease), stage III [facility])    nephrologist-- dr forbes. gearline   Complication of anesthesia    post op acute urinary retention   DDD (degenerative disc disease)    Fatigue    pt evalulation by cardiology--- dr debby decent, note 05-21-2019 in epic,(non cardiac)  normal echo and event monitor showed extra beats of atrial tachycardia , no atrial fib, and rare ecotpy   Full dentures    GERD  (gastroesophageal reflux disease)    History of basal cell carcinoma (BCC) excision    12-25-2018 MOH's reconstruction left lower eyelid   History of external beam radiation therapy 2002   prostate cancer  and boost with radioative prostate seed implants   History of prostate cancer DX  2002   S/P EXTERNAL RADIATION/ RADIOACTIVE SEED IMPLANTS  2003   History of squamous cell carcinoma excision    2012--  RIGHT LOWER EXTREM;  2019 LEFT LOWER EXTREMITIY   HTN (hypertension)    followed by pcp   Hyperlipidemia    Hypothyroidism    followed by pcp--- acquired atrophy due to radiation;  previously seen by endocrinologist-  dr tommas--- per pt takes his own thyroid  med. called NP Thyroid  90 mg daily, does not take synthroid    Large vessel vasculitis (HCC)    rheumotologist-  dr syed --- IgG4 related aoritis involving renal arteries ,  pt treated with predisone.  (09-06-2019  per pt no longer prednisone  last taken one month ago 07/ 2021, weaned off by pcp per pt request)   Left renal artery stenosis    nephrologist--- dr gearline--- with aneurysm proximal    Lymphedema of left leg    followed by [facility]---- pt stated uses compression hose   OSA (obstructive sleep apnea) 09-06-2019  per pt lasted used cpap approx. 2019, stated felt he no longer needed    pt retested 11-13-2016 at [facility] w/ dr ather-- moderate to severe OSA , AHI 16.4/h/  03-24-2017    Pulmonary nodule followed by [facility]   per CT 06-11-2019  in Care Everywhere in epic done at [facility],  LUL nodule , not mets   Renal cyst, right    Restless legs syndrome (RLS)    S/P radiation therapy  for tonsillar cancer (head and neck) completed 01-26-2012 at [facility]);   and XRT for right lower leg sarcoma --- completed 06-06-2017   Saliva decreased    Sarcoma of lower extremity, right (HCC) oncolgoist-  dr candiss ([facility]) & Dr LELON. 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+) (Stage IVA)---- dx oct 2013  ----s/p left tonsillectomy and concurent chemo and radiation/  ended 01-26-2012-- no surgical intervention---  residuals ( dry mouth, decreased saliva)   oncologist at [facility]--  dr brizel--  HX OF -- NO RECURRENCE   Urethral false passage 07/2019   Urethral stricture urologist--- dr watt   chronic---  post urethral dilation's   Urinary retention with incomplete bladder emptying    Vitamin B 12 deficiency 01/28/2011   Vitamin D  deficiency     Past Surgical History:  Procedure Laterality Date   BALLOON DILATION N/A 11/10/2014   Procedure: CYSTO BALLOON DILATION AND RETROGRADE URETHROGRAM ;  Surgeon: Glendia Elizabeth, MD;  Location: [facility];  Service: Urology;  Laterality: N/A;   CATARACT EXTRACTION W/ INTRAOCULAR LENS  IMPLANT, BILATERAL  2012  approx   CYSTO/ BALLOON DILATION OF  URETHRAL STRICTURE  12-25-2010   CYSTOSCOPY WITH RETROGRADE URETHROGRAM N/A 03/30/2016   Procedure: CYSTOSCOPY WITH RETROGRADE URETHROGRAM AND BALLOON DILATION with cystogram;  Surgeon: Glendia Elizabeth, MD;  Location: [facility];  Service: Urology;  Laterality: N/A;   CYSTOSCOPY WITH URETHRAL DILATATION  05/31/2011   Procedure: CYSTOSCOPY WITH URETHRAL DILATATION;  Surgeon: Glendia DELENA Elizabeth, MD;  Location: [facility];  Service: Urology;  Laterality: N/A;  BALLOON DILATION   CYSTOSCOPY WITH URETHRAL DILATATION N/A 09/13/2016   Procedure: CYSTOSCOPY WITH URETHRAL BALLOON DILATATION;  Surgeon: Elizabeth Glendia, MD;  Location: [facility];  Service: Urology;  Laterality: N/A;   CYSTOSCOPY WITH URETHRAL DILATATION N/A 03/30/2017    Procedure: CYSTOSCOPY WITH BALLOON URETHRAL DILATATION;  Surgeon: Elizabeth Glendia, MD;  Location: [facility];  Service: Urology;  Laterality: N/A;   CYSTOSCOPY WITH URETHRAL DILATATION N/A 09/12/2019   Procedure: CYSTOSCOPY WITH URETHRAL DILATATION;  Surgeon: Watt Rush, MD;  Location: [facility];  Service: Urology;  Laterality: N/A;   CYSTOSCOPY/RETROGRADE/URETEROSCOPY N/A 05/22/2012   Procedure: CYSTOSCOPY BALLOON DILATION RETROGRADE URETEROGRAM ;  Surgeon: Glendia DELENA Elizabeth, MD;  Location: [facility] ;  Service: Urology;  Laterality: N/A;   CYTSO/  DILATATION URETHRAL STRICTURE/  BX PROSTATIC URETHRA/  REMOVAL FOREIGN BODIES  06-25-2010   [facility]   ECTROPION REPAIR Left 06/18/2019   lower eyelid   INGUINAL HERNIA REPAIR Right 2000   MOHS SURGERY  01/ 2019    [facility]   left lower leg for [facility]   MOHS SURGERY  12/25/2018   for [facility] of left lower eyelid;  the second stage Hughs flap release 01-09-2019   RADICAL RESECTION OF SARCOMA TUMOR   03-15-2017   [facility]   RIGHT LOWER LEG , CALF AREA   RADIOACTIVE SEED IMPLANTS, PROSTATE  JAN  2003   SKIN LESION EXCISION  07/2012   MOST  right shoulder   TONSILLECTOMY Left 11-10-2011  @[facility]    tonsillar  cancer   TRANSURETHRAL INCISION OF BLADDER NECK N/A 09/12/2019   Procedure: TRANSURETHRAL INCISION OF BLADDER NECK;  Surgeon: Watt Rush, MD;  Location: [facility];  Service: Urology;  Laterality: N/A;     Current Outpatient Medications on File Prior to Visit  Medication Sig Dispense Refill   amLODipine  (NORVASC ) 5 MG tablet Take 0.5 tablets (2.5 mg total) by mouth 2 (two) times daily as needed (HTN). Give if bp >140 90 tablet 1   buPROPion  (WELLBUTRIN ) 100 MG tablet Take 1 tablet (100 mg total) by mouth 3 (three) times daily. 90 tablet 3   Cholecalciferol  (VITAMIN D ) 50 MCG (2000 UT) CAPS Take by mouth every other day.     cyanocobalamin  (VITAMIN B12) 1000 MCG/ML injection INJECT 1ML INTRAMUSCULARLY  EVERY 14 DAYS 6 mL 3   NP THYROID  120 MG tablet Take 90 mg by mouth daily.     SYRINGE-NEEDLE, DISP, 3 ML (B-D 3CC LUER-LOK SYR 25GX5/8) 25G X 5/8 3 ML MISC USE FOR INJECTION EVERY 14 DAYS 50 each 1   No current facility-administered medications on file prior to visit.    Allergies  Allergen Reactions   Cefixime Rash   Contrast Media [Iodinated Contrast Media] Other (See Comments)    Avoid due to renal disease   Lisinopril  Swelling    Swollen lower lip   Macrobid [Nitrofurantoin Macrocrystal] Swelling   Nsaids Other (See Comments)    Has been told no NSAIDs, Ibuprofen Renal insufficiency hx.    Sulfa Antibiotics    Cephalosporins Rash     DIAGNOSTIC DATA (LABS, IMAGING, TESTING) - I reviewed patient records, labs, notes, testing and imaging myself where available.  Lab Results  Component Value Date   WBC 5.4 06/27/2023   HGB 13.3 (A) 06/27/2023   HCT 40 (A) 06/27/2023   MCV 92.1 06/01/2023   PLT 178 06/27/2023      Component Value Date/Time   NA 140 10/05/2023 0000   K 4.2 10/05/2023 0000   CL 100 10/05/2023 0000   CO2 25 (A) 10/05/2023 0000   GLUCOSE 102 (H) 06/01/2023 1019   BUN 34 (A) 10/05/2023 0000   CREATININE 1.5 (A) 10/05/2023 0000   CREATININE 1.51 (H) 06/01/2023 1019   CREATININE 1.72 (H) 05/13/2022 1502   CALCIUM 9.4 10/05/2023 0000   PROT 6.7 06/01/2023 1019   ALBUMIN 3.9 10/05/2023 0000   AST 20 10/05/2023 0000   ALT 16 10/05/2023 0000   ALKPHOS 92 10/05/2023 0000   BILITOT 0.3 06/01/2023 1019   GFRNONAA 44 (L) 06/01/2023 1019   GFRAA 40 (L) 08/24/2017 2136   Lab Results  Component Value Date   CHOL 201 (A) 03/21/2023   HDL 72 (A) 03/21/2023   LDLCALC 113 03/21/2023   TRIG 78 03/21/2023   No results found for: HGBA1C Lab Results  Component Value Date   VITAMINB12 858 03/10/2022   Lab Results  Component Value Date   TSH 0.97 07/29/2021    PHYSICAL EXAM:  Vitals:   12/21/23 1129 12/21/23 1141  BP: (!) 201/109 (!) 213/109   Pulse: 67   SpO2: 97%    No data found. Body mass index is 21.9 kg/m.   Wt Readings from Last 3 Encounters:  12/21/23 157 lb (71.2 kg)  12/12/23 156 lb 6.4 oz (70.9 kg)  10/02/23 156 lb 6.4 oz (70.9 kg)     Ht Readings from Last 3 Encounters:  12/21/23 5' 11 (1.803 m)  12/12/23 5' 11 (1.803 m)  10/02/23 5' 11 (  1.803 m)      General: The patient is awake, alert and appears not in acute distress and groomed. Head: Normocephalic, atraumatic.  Neck is supple. Cardiovascular:  Regular rate and cardiac rhythm by pulse, without distended neck veins. Respiratory: no shortness of breath  Skin:  Without evidence of ankle edema, or rash. Trunk: erect posture     NEUROLOGIC EXAM: The patient is awake and alert, oriented to place and time.   Memory subjective described as intact.  Attention span & concentration ability appears normal.   Speech is fluent,  without  dysarthria, dysphonia or aphasia.  Mood and affect are depressed, he became tearful when speaking .    Neurological Examination: Mental Status: Intact. Language and speech are normal.  Tearful.  Impaired vision.   Cranial Nerves II-XII: I EOMI. No nystagmus.  No facial droop.  No ptosis.  Hearing is grossly intact bilaterally.  The tongue is normal and midline. Motor: Strengths are 5/5 throughout. Muscle bulk and tone are normal. No tremors.  Coordination: No ataxia or dysmetria.  Sensory: Grossly intact throughout to all modalities. Reflexes: Normal and symmetric throughout.  No ankle clonus.  ASSESSMENT AND PLAN :   89 y.o. year old male  here with:    1)ABNORMAL MRI :  hemosiderin deposits, brain bleeds, uncontrolled hypertension may be the main cause.   I have ordered an MRA study to make sure there are no leaking Aneuryms or AVMs.  This study was ordered at the [facility] location.   I just learnt he had the MRA at [facility] and on 12-19-2023 but it has not been read yet .    We reviewed the MRI ( may  2025)  scan together, he has many posterior brain hemorrhages.   Mild cortical atrophy would be normal for age   2) CPAP felt to be less effective, I agree that he needs a higher pressure or better a variable pressure setting, needs a new machine .  Mask is sealing well and can be used with any machine .  Lastmachine issued in 2018 ? Had another sleep studyin 2023  which should have replaced his machine then?  I wrote a DME order.   3) General vascular health rules , HTN affecting brain and body :  Regular medication intake - that he could benefit from assistant living to have better control and regular intake, his memory fails him, his depression also interferes with his cognitive function.     RV yearly with NP for CPAP     I would like to thank Philip Fredia CROME, MD and Philip Fredia CROME, Md [address] [place],  [place] [address] for allowing me to meet with this pleasant patient.   The referring provider will be notified of the test results.   The patient's condition requires frequent monitoring and adjustments in the treatment plan, reflecting the ongoing complexity of care.  This provider is the continuing focal point for all needed services for this condition.  After spending a total time of  45  minutes face to face and time for  history taking, physical and neurologic examination, review of laboratory studies,  personal review of imaging studies, reports and results of other testing and review of referral information / records as far as provided in visit,   Electronically signed by: Philip Gores, MD 12/21/2023 11:50 AM  [facility] Sleep Board certified by The [employer] of Sleep Medicine and Diplomate of the [employer] of  Sleep Medicine. Board certified In Neurology through the ABPN, Fellow of the [employer] of Neurology.

## 2023-12-21 NOTE — Telephone Encounter (Signed)
 Per Dr. Chalice - I just found out MR Walder had already an MRA brain study on 12-02, but there is no report,  it was done at [facility].    I will call him with the results as soon as read by radiology     Patient has been notified.

## 2023-12-21 NOTE — Patient Instructions (Signed)
 89 y.o. year old male  here with:    1)ABNORMAL MRI :  hemosiderin deposits, brain bleeds, uncontrolled hypertension may be the main cause.   I have ordered an MRA study to make sure there are no leaking Aneuryms or AVMs.  This study was ordered at the [facility] location.   We reviewed the scan together, he has many posterior brain hemorrhages.   Mild cortical atrophy would be normal for age   2) CPAP felt to be less effective, I agree that he needs a higher pressure or better a variable pressure setting, needs a new machine .  Mask is sealing well and can be used with any machine .  Lastmachine issued in 2018 ? Had another sleep studyin 2023  which should have replaced his machine then?  I wrote a DME order.   3) General vascular health rules , HTN affecting brain and body :  Regular medication intake - that he could benefit from assistant living to have better control and regular intake, his memory fails him, his depression also interferes with his cognitive function.     RV yearly with NP for CPAP unless abnormal MRA results are found. Then we meet earlier.

## 2023-12-21 NOTE — Telephone Encounter (Signed)
 Spoke to patiente PCP regarding Bp reading and informing her of the abnormalities so she is aware. Dr. Charlanne let me know that she will have her nurse check in with him throughout the day to get in contact.

## 2023-12-23 ENCOUNTER — Ambulatory Visit: Payer: Self-pay | Admitting: Neurology

## 2023-12-25 NOTE — Telephone Encounter (Signed)
 Thanks for updating me. I have talked to [facility] staff and they will talk to him to get enrolled in Med Management which makes sure they take their meds Regularly or Move him to [place]. He has been refusing for both so far but this will probably push him to make a decision.

## 2023-12-27 ENCOUNTER — Encounter: Payer: Self-pay | Admitting: Internal Medicine

## 2023-12-27 MED ORDER — AMLODIPINE BESYLATE 5 MG PO TABS: 5.0000 mg | ORAL_TABLET | Freq: Two times a day (BID) | ORAL | Status: AC

## 2023-12-27 MED ORDER — HYDRALAZINE HCL 10 MG PO TABS: 10.0000 mg | ORAL_TABLET | Freq: Every day | ORAL | Status: AC | PRN

## 2023-12-27 NOTE — Progress Notes (Signed)
 Nurse from [facility] called yesterday at 5 pm when she was visiting Philip [name] his SBP was more then 200. He said he took his Norvasc  2.5 mg already. He denied any headache chest pain or any other symptoms. Discussed with the family and he was admitted in Rehab to monitor his BP I also changed his Norvasc  to 5 mg BID Hold only if SBP is less then 130 Also Will use Hydralazine PRN for now till get the pattern for his BP Patient is known to have wide fluctuations of his BP with Dizziness

## 2023-12-28 ENCOUNTER — Telehealth: Payer: Self-pay | Admitting: Adult Health

## 2023-12-28 MED ORDER — BUPROPION HCL ER (XL) 150 MG PO TB24: 150.0000 mg | ORAL_TABLET | Freq: Every day | ORAL | Status: AC

## 2023-12-28 NOTE — Telephone Encounter (Signed)
 Nurse is having trouble administering the wellbutrin  three times a day  because he leaves the skilled unit and does not return til 8pm and refuses to come back at 2pm Will change to extended release wellbutrin  He left before I could see him today, will try again in the morning.

## 2024-01-02 NOTE — Telephone Encounter (Signed)
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  Blood pressure control is very important for this patients brain health -to protect from aneurysm rupture.  A CT-Angiogram  could help to look at it in more detail but can be decided once BP is well controlled ( it is most likely not necessary !) .  No significant stenosis of brain blood vessels, normal  flow.  ----- Message ----- From: Interface, Rad Results In Sent: 12/23/2023  12:36 PM EST To: Dedra Gores, MD

## 2024-01-02 NOTE — Telephone Encounter (Signed)
 I called pt.  I relayed results of the MRA per below.  He stated verbal understanding.  He did not have questions.  I asked if needed to call anyone else, he said no.  He asked about new machine pressures autopap. I relayed will follow up on that.  DME [facility] confirmed. Relayed needed an appt 2-3 months after getting new machine and to call office to schedule appt.  He stated he understood.

## 2024-01-03 NOTE — Telephone Encounter (Signed)
 Interesting findings which can certainly be seen in cerebral amyloid angiopathy given the patient's age of 37 however they are quite asymmetric.  Given prior history of major sports injury could also represent remote hemorrhagic contusion in that area.  If patient has cognitive impairment and dementia and is likely amyloid angiopathy.  This patient is certainly not a candidate for dual antiplatelet therapy, thrombolysis or new antiamyloid therapies due to high risk for intracerebral hemorrhage

## 2024-01-10 ENCOUNTER — Telehealth: Payer: Self-pay | Admitting: Internal Medicine

## 2024-01-10 MED ORDER — HYDRALAZINE HCL 25 MG PO TABS: 25.0000 mg | ORAL_TABLET | Freq: Every day | ORAL | Status: AC

## 2024-01-10 MED ORDER — HYDRALAZINE HCL 10 MG PO TABS: 10.0000 mg | ORAL_TABLET | Freq: Every morning | ORAL | Status: AC

## 2024-01-10 NOTE — Telephone Encounter (Signed)
 [place] for [place] BP management His BP is doing well with Hydralazine  25 mg at night and 10 mg in the morning

## 2024-01-23 ENCOUNTER — Encounter: Payer: PRIVATE HEALTH INSURANCE | Admitting: Internal Medicine

## 2024-01-26 NOTE — Progress Notes (Signed)
 A user error has taken place.

## 2024-02-13 ENCOUNTER — Encounter: Payer: Self-pay | Admitting: Internal Medicine

## 2024-02-13 ENCOUNTER — Non-Acute Institutional Stay: Payer: PRIVATE HEALTH INSURANCE | Admitting: Internal Medicine

## 2024-02-13 VITALS — BP 154/84 | HR 62 | Temp 98.1°F | Wt 159.8 lb

## 2024-02-13 DIAGNOSIS — I129 Hypertensive chronic kidney disease with stage 1 through stage 4 chronic kidney disease, or unspecified chronic kidney disease: Secondary | ICD-10-CM

## 2024-02-13 DIAGNOSIS — N390 Urinary tract infection, site not specified: Secondary | ICD-10-CM | POA: Diagnosis not present

## 2024-02-13 DIAGNOSIS — F5101 Primary insomnia: Secondary | ICD-10-CM | POA: Diagnosis not present

## 2024-02-13 DIAGNOSIS — E854 Organ-limited amyloidosis: Secondary | ICD-10-CM | POA: Diagnosis not present

## 2024-02-13 DIAGNOSIS — F3341 Major depressive disorder, recurrent, in partial remission: Secondary | ICD-10-CM | POA: Diagnosis not present

## 2024-02-13 DIAGNOSIS — E034 Atrophy of thyroid (acquired): Secondary | ICD-10-CM

## 2024-02-13 DIAGNOSIS — E538 Deficiency of other specified B group vitamins: Secondary | ICD-10-CM

## 2024-02-13 DIAGNOSIS — R42 Dizziness and giddiness: Secondary | ICD-10-CM

## 2024-02-13 DIAGNOSIS — I680 Cerebral amyloid angiopathy: Secondary | ICD-10-CM | POA: Diagnosis not present

## 2024-02-13 DIAGNOSIS — R4189 Other symptoms and signs involving cognitive functions and awareness: Secondary | ICD-10-CM

## 2024-02-13 DIAGNOSIS — N183 Chronic kidney disease, stage 3 unspecified: Secondary | ICD-10-CM

## 2024-02-13 NOTE — Progress Notes (Unsigned)
 "  Location:  Medical Illustrator of Service:  Clinic (12)  Provider:   Code Status: DNR Goals of Care:     06/01/2023   10:09 AM  Advanced Directives  Does Patient Have a Medical Advance Directive? Yes     Chief Complaint  Patient presents with   Follow-up    1 month  Patient has concerns about feeling very tired.    HPI: Patient is a 89 y.o. male seen today for medical management of chronic diseases.     [place]  Patient has a history of CAA Alzheimer panel negative. He was having some episodes of TIA.  [place].  And his blood pressure was running very high.  He was unable to manage his medications.  And it was decided to move him to [place] Since being here he has not had any episodes of TIA His blood pressure is still running slightly on the higher side with SBP more than 150  He also had a complaint of rash and itching in his back.  He said that he did not have this issue before he moved to [place]. He does have appointment with dermatologist  He was also complaining of constipation. He thinks he is not getting the MiraLAX  as he is supposed to every other day  He continues to struggle with some depression and insomnia  His other history includes CKD follows with renal Sarcoma of the right lower extremity follows with [facility]ology has been in remission History of prostate cancer self catheterizes Also h/o UTI History of Aortitis Syndrome Sleep APnea B 12 Def Cognitive Impairment  Past Medical History:  Diagnosis Date   Age-related macular degeneration, dry, both eyes    Aortitis syndrome (HCC) rheumotologist-  dr syed   IgG4 syndrome--  (effects abdomine) ,  previously long term use prednisone  last taken 07/ 2021   Basal cell carcinoma (BCC) of eye    CKD (chronic kidney disease), stage III [facility])    nephrologist-- dr forbes. gearline   Complication of anesthesia    post op acute urinary retention   DDD (degenerative  disc disease)    Fatigue    pt evalulation by cardiology--- dr debby decent, note 05-21-2019 in epic,(non cardiac)  normal echo and event monitor showed extra beats of atrial tachycardia , no atrial fib, and rare ecotpy   Full dentures    GERD (gastroesophageal reflux disease)    History of basal cell carcinoma (BCC) excision    12-25-2018 MOH's reconstruction left lower eyelid   History of external beam radiation therapy 2002   prostate cancer  and boost with radioative prostate seed implants   History of prostate cancer DX  2002   S/P EXTERNAL RADIATION/ RADIOACTIVE SEED IMPLANTS  2003   History of squamous cell carcinoma excision    2012--  RIGHT LOWER EXTREM;  2019 LEFT LOWER EXTREMITIY   HTN (hypertension)    followed by pcp   Hyperlipidemia    Hypothyroidism    followed by pcp--- acquired atrophy due to radiation;  previously seen by endocrinologist-  dr tommas--- per pt takes his own thyroid  med. called NP Thyroid  90 mg daily, does not take synthroid    Large vessel vasculitis (HCC)    rheumotologist-  dr syed --- IgG4 related aoritis involving renal arteries ,  pt treated with predisone.  (09-06-2019  per pt no longer prednisone  last taken one month ago 07/ 2021, weaned off  by pcp per pt request)   Left renal artery stenosis    nephrologist--- dr gearline--- with aneurysm proximal    Lymphedema of left leg    followed by [facility]---- pt stated uses compression hose   OSA (obstructive sleep apnea) 09-06-2019  per pt lasted used cpap approx. 2019, stated felt he no longer needed    pt retested 11-13-2016 at [facility] w/ dr ather-- moderate to severe OSA , AHI 16.4/h/  03-24-2017    Pulmonary nodule followed by [facility]   per CT 06-11-2019  in Care Everywhere in epic done at [facility],  LUL nodule , not mets   Renal cyst, right    Restless legs syndrome (RLS)    S/P radiation therapy     for tonsillar cancer (head and neck) completed 01-26-2012 at [facility]);    and XRT for right lower leg sarcoma --- completed 06-06-2017   Saliva decreased    Sarcoma of lower extremity, right (HCC) oncolgoist-  dr candiss ([facility]) & Dr MICAEL Ramp ([facility])   dx 02-14-2017 w/ needle core bx;  high grade pleomorphic spindle cell sarcoma , grade 2 (cT2N0M0); 03-15-2017  radical resection sarcoma tumor right lower leg  and completed radiation 06-06-2017   Self-catheterizes urinary bladder    QID   AND   PRN   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+) (Stage IVA)---- dx oct 2013  ----s/p left tonsillectomy and concurent chemo and radiation/  ended 01-26-2012-- no surgical intervention---  residuals ( dry mouth, decreased saliva)   oncologist at [facility]--  dr brizel--  HX OF -- NO RECURRENCE   Urethral false passage 07/2019   Urethral stricture urologist--- dr watt   chronic---  post urethral dilation's   Urinary retention with incomplete bladder emptying    Vitamin B 12 deficiency 01/28/2011   Vitamin D  deficiency     Past Surgical History:  Procedure Laterality Date   BALLOON DILATION N/A 11/10/2014   Procedure: CYSTO BALLOON DILATION AND RETROGRADE URETHROGRAM ;  Surgeon: Glendia Elizabeth, MD;  Location: [facility];  Service: Urology;  Laterality: N/A;   CATARACT EXTRACTION W/ INTRAOCULAR LENS  IMPLANT, BILATERAL  2012  approx   CYSTO/ BALLOON DILATION OF  URETHRAL STRICTURE  12-25-2010   CYSTOSCOPY WITH RETROGRADE URETHROGRAM N/A 03/30/2016   Procedure: CYSTOSCOPY WITH RETROGRADE URETHROGRAM AND BALLOON DILATION with cystogram;  Surgeon: Glendia Elizabeth, MD;  Location: [facility];  Service: Urology;  Laterality: N/A;   CYSTOSCOPY WITH URETHRAL DILATATION  05/31/2011   Procedure: CYSTOSCOPY WITH URETHRAL DILATATION;  Surgeon: Glendia DELENA Elizabeth, MD;  Location: [facility] [place];  Service: Urology;  Laterality: N/A;  BALLOON DILATION   CYSTOSCOPY WITH URETHRAL  DILATATION N/A 09/13/2016   Procedure: CYSTOSCOPY WITH URETHRAL BALLOON DILATATION;  Surgeon: Elizabeth Glendia, MD;  Location: [facility];  Service: Urology;  Laterality: N/A;   CYSTOSCOPY WITH URETHRAL DILATATION N/A 03/30/2017   Procedure: CYSTOSCOPY WITH BALLOON URETHRAL DILATATION;  Surgeon: Elizabeth Glendia, MD;  Location: [facility];  Service: Urology;  Laterality: N/A;   CYSTOSCOPY WITH URETHRAL DILATATION N/A 09/12/2019   Procedure: CYSTOSCOPY WITH URETHRAL DILATATION;  Surgeon: Watt Rush, MD;  Location: [facility];  Service: Urology;  Laterality: N/A;   CYSTOSCOPY/RETROGRADE/URETEROSCOPY N/A 05/22/2012   Procedure: CYSTOSCOPY BALLOON DILATION RETROGRADE URETEROGRAM ;  Surgeon: Glendia DELENA Elizabeth, MD;  Location: [facility] ;  Service: Urology;  Laterality: N/A;   CYTSO/  DILATATION URETHRAL STRICTURE/  BX PROSTATIC URETHRA/  REMOVAL FOREIGN BODIES  06-25-2010   [facility]   ECTROPION REPAIR Left 06/18/2019   lower eyelid   INGUINAL HERNIA REPAIR Right 2000   MOHS SURGERY  01/ 2019    [facility]   left lower leg for [facility]   MOHS SURGERY  12/25/2018   for [facility] of left lower eyelid;  the second stage Hughs flap release 01-09-2019   RADICAL RESECTION OF SARCOMA TUMOR   03-15-2017   [facility]   RIGHT LOWER LEG , CALF AREA   RADIOACTIVE SEED IMPLANTS, PROSTATE  JAN  2003   SKIN LESION EXCISION  07/2012   MOST  right shoulder   TONSILLECTOMY Left 11-10-2011  @[facility]    tonsillar cancer   TRANSURETHRAL INCISION OF BLADDER NECK N/A 09/12/2019   Procedure: TRANSURETHRAL INCISION OF BLADDER NECK;  Surgeon: Watt Rush, MD;  Location: [facility] [place];  Service: Urology;  Laterality: N/A;    Allergies[1]  Outpatient Encounter Medications as of 02/13/2024  Medication Sig   acetaminophen  (TYLENOL ) 500 MG tablet Take 1,000 mg by mouth. Give 2 tablet by mouth every 24 hours as needed for for pain related to RIGHT LOWER QUADRANT PAIN (R10.31)    amLODipine  (NORVASC ) 5 MG tablet Take 1 tablet (5 mg total) by mouth 2 (two) times daily.   buPROPion  (WELLBUTRIN  XL) 150 MG 24 hr tablet Take 1 tablet (150 mg total) by mouth daily. 150 mg daily for 5 days then increase to 300 mg daily   Cholecalciferol  (VITAMIN D ) 50 MCG (2000 UT) CAPS Take by mouth every other day.   cyanocobalamin  (VITAMIN B12) 1000 MCG/ML injection INJECT 1ML INTRAMUSCULARLY EVERY 14 DAYS   hydrALAZINE  (APRESOLINE ) 10 MG tablet Take 1 tablet (10 mg total) by mouth daily as needed. For SBP more then 180   hydrALAZINE  (APRESOLINE ) 10 MG tablet Take 1 tablet (10 mg total) by mouth every morning.   hydrALAZINE  (APRESOLINE ) 25 MG tablet Take 1 tablet (25 mg total) by mouth at bedtime.   NP THYROID  120 MG tablet Take 90 mg by mouth daily.   polyethylene glycol (MIRALAX  / GLYCOLAX ) 17 g packet Take 17 g by mouth daily. Give 1 scoop by mouth every 48 hours for constipation mix the 17grams of miralax  in 4-6oz of liquid PO QOD   SYRINGE-NEEDLE, DISP, 3 ML (B-D 3CC LUER-LOK SYR 25GX5/8) 25G X 5/8 3 ML MISC USE FOR INJECTION EVERY 14 DAYS   No facility-administered encounter medications on file as of 02/13/2024.    Review of Systems:  Review of Systems  Constitutional:  Negative for activity change, appetite change and unexpected weight change.  HENT: Negative.    Respiratory:  Negative for cough and shortness of breath.   Cardiovascular:  Positive for leg swelling.  Gastrointestinal:  Positive for constipation.  Genitourinary:  Negative for frequency.  Musculoskeletal:  Negative for arthralgias, gait problem and myalgias.  Skin: Negative.  Negative for rash.  Neurological:  Negative for dizziness and weakness.  Psychiatric/Behavioral:  Positive for confusion. Negative for sleep disturbance.   All other systems reviewed and are negative.   Health Maintenance  Topic Date Due   Medicare Annual Wellness (AWV)  11/13/2020   COVID-19 Vaccine (8 - 2025-26 season) 09/18/2023    Pneumococcal Vaccine: 50+ Years  Completed   Meningococcal B Vaccine  Aged Out   DTaP/Tdap/Td  Discontinued   Influenza Vaccine  Discontinued   Zoster Vaccines- Shingrix  Discontinued  Physical Exam: Vitals:   02/13/24 1013 02/13/24 1021  BP: (!) 144/88 (!) 154/84  Pulse: 62   Temp: 98.1 F (36.7 C)   SpO2: 95%   Weight: 159 lb 12.8 oz (72.5 kg)    Body mass index is 22.29 kg/m. Physical Exam Vitals reviewed.  Constitutional:      Appearance: Normal appearance.  HENT:     Head: Normocephalic.     Nose: Nose normal.     Mouth/Throat:     Mouth: Mucous membranes are moist.     Pharynx: Oropharynx is clear.  Eyes:     Pupils: Pupils are equal, round, and reactive to light.  Cardiovascular:     Rate and Rhythm: Normal rate and regular rhythm.     Pulses: Normal pulses.     Heart sounds: No murmur heard. Pulmonary:     Effort: Pulmonary effort is normal. No respiratory distress.     Breath sounds: Normal breath sounds. No rales.  Abdominal:     General: Abdomen is flat. Bowel sounds are normal.     Palpations: Abdomen is soft.  Musculoskeletal:        General: Swelling present.     Cervical back: Neck supple.  Skin:    General: Skin is warm.     Comments: Papular rash in his back Some noticed in his chest and Arms where he is itching  Neurological:     General: No focal deficit present.     Mental Status: He is alert and oriented to person, place, and time.  Psychiatric:        Mood and Affect: Mood normal.        Thought Content: Thought content normal.     Labs reviewed: Basic Metabolic Panel: Recent Labs    06/01/23 1019 06/05/23 0000 06/05/23 1555 06/27/23 0000 06/27/23 1000 10/05/23 0000  NA 137  --  140  --  140 140  K 4.3  --  4.5  --  4.5 4.2  CL 99  --  104  --  103 100  CO2 27  --  25*  --  23* 25*  GLUCOSE 102*  --   --   --   --   --   BUN 30*  --  28*  --  26* 34*  CREATININE 1.51*  --  1.5*  --  1.4* 1.5*  CALCIUM 8.8* 8.2*  --  8.9   --  9.4   Liver Function Tests: Recent Labs    03/21/23 0000 06/01/23 1019 10/05/23 0000  AST 19 23 20   ALT 20 17 16   ALKPHOS 76 80 92  BILITOT  --  0.3  --   PROT  --  6.7  --   ALBUMIN 3.9 3.8 3.9   No results for input(s): LIPASE, AMYLASE in the last 8760 hours. No results for input(s): AMMONIA in the last 8760 hours. CBC: Recent Labs    03/21/23 0000 06/01/23 1019 06/27/23 0000  WBC 4.8 3.9* 5.4  NEUTROABS  --  2.6  --   HGB 12.3* 13.0 13.3*  HCT 37* 37.3* 40*  MCV  --  92.1  --   PLT 209 121* 178   Lipid Panel: Recent Labs    03/21/23 0000  CHOL 201*  HDL 72*  LDLCALC 113  TRIG 78   No results found for: HGBA1C  Procedures since last visit: No results found.  Assessment/Plan 1. Benign hypertension with chronic kidney disease, stage III (HCC) (Primary) BP  still runs high in the morning and afternoon Change hydrlazine to 25 mg BID Also on Norvasc  BID dosing Continue to monitor Review in 2 weeks Goal SBP is 130-140 2. Cerebral amyloid angiopathy (HCC) No Statin or aspirin Control BP  3. Cognitive impairment Alzheimer Panel Negative MRI has shown CAA Doing well in [place]  4. B12 deficiency On 2/month Injections  5. Recurrent major depressive disorder, in partial remission Wellbutrin  Can consider adding SSRI now that he is in [place]  6. Recurrent UTI and Self Catheterizes Sees Urology  7. Dizziness Better recently with good control of BP  8. Primary insomnia Has failed Restoril  and Remeron  and Trazodone   in the past Can reconsider now that he I s in [place]  9. Hypothyroidism due to acquired atrophy of thyroid  Repeat TSH  10 Rash Has Appointment with Derm Triamcinolone  ointment Double washLaundry 11 On CPAP   Labs/tests ordered:  CBC,CMP,TSH Next appt:  02/27/2024         [1]  Allergies Allergen Reactions   Cefixime Rash   Contrast Media [Iodinated Contrast Media] Other (See Comments)    Avoid due to renal disease    Lisinopril  Swelling    Swollen lower lip   Macrobid [Nitrofurantoin Macrocrystal] Swelling   Nsaids Other (See Comments)    Has been told no NSAIDs, Ibuprofen Renal insufficiency hx.    Sulfa Antibiotics    Cephalosporins Rash   "

## 2024-02-27 ENCOUNTER — Encounter: Payer: PRIVATE HEALTH INSURANCE | Admitting: Internal Medicine

## 2025-01-07 ENCOUNTER — Ambulatory Visit: Admitting: Family Medicine
